# Patient Record
Sex: Female | Born: 1955 | Race: Black or African American | Hispanic: No | Marital: Single | Smoking: Former smoker
Health system: Southern US, Community
[De-identification: ages and names within clinical notes are randomized; demographics above are authoritative.]

## PROBLEM LIST (undated history)

## (undated) DIAGNOSIS — Z9119 Patient's noncompliance with other medical treatment and regimen: Secondary | ICD-10-CM

## (undated) DIAGNOSIS — I472 Ventricular tachycardia, unspecified: Secondary | ICD-10-CM

## (undated) DIAGNOSIS — F419 Anxiety disorder, unspecified: Secondary | ICD-10-CM

## (undated) DIAGNOSIS — D6859 Other primary thrombophilia: Secondary | ICD-10-CM

## (undated) DIAGNOSIS — Z91199 Patient's noncompliance with other medical treatment and regimen due to unspecified reason: Secondary | ICD-10-CM

## (undated) DIAGNOSIS — I10 Essential (primary) hypertension: Secondary | ICD-10-CM

## (undated) DIAGNOSIS — R569 Unspecified convulsions: Secondary | ICD-10-CM

## (undated) DIAGNOSIS — I428 Other cardiomyopathies: Secondary | ICD-10-CM

## (undated) DIAGNOSIS — I639 Cerebral infarction, unspecified: Secondary | ICD-10-CM

## (undated) DIAGNOSIS — Z86718 Personal history of other venous thrombosis and embolism: Secondary | ICD-10-CM

## (undated) DIAGNOSIS — F410 Panic disorder [episodic paroxysmal anxiety] without agoraphobia: Secondary | ICD-10-CM

## (undated) DIAGNOSIS — I5022 Chronic systolic (congestive) heart failure: Secondary | ICD-10-CM

## (undated) DIAGNOSIS — N289 Disorder of kidney and ureter, unspecified: Secondary | ICD-10-CM

## (undated) DIAGNOSIS — E039 Hypothyroidism, unspecified: Secondary | ICD-10-CM

## (undated) DIAGNOSIS — J45909 Unspecified asthma, uncomplicated: Secondary | ICD-10-CM

## (undated) DIAGNOSIS — I4891 Unspecified atrial fibrillation: Secondary | ICD-10-CM

## (undated) HISTORY — DX: Other cardiomyopathies: I42.8

## (undated) HISTORY — PX: ABDOMINAL HYSTERECTOMY: SHX81

## (undated) HISTORY — PX: APPENDECTOMY: SHX54

## (undated) HISTORY — PX: CHOLECYSTECTOMY: SHX55

## (undated) HISTORY — PX: PACEMAKER INSERTION: SHX728

---

## 2009-03-30 ENCOUNTER — Emergency Department (HOSPITAL_BASED_OUTPATIENT_CLINIC_OR_DEPARTMENT_OTHER): Admission: EM | Admit: 2009-03-30 | Discharge: 2009-03-30 | Payer: Self-pay | Admitting: Emergency Medicine

## 2009-04-11 ENCOUNTER — Ambulatory Visit: Payer: Self-pay | Admitting: Diagnostic Radiology

## 2009-04-11 ENCOUNTER — Emergency Department (HOSPITAL_BASED_OUTPATIENT_CLINIC_OR_DEPARTMENT_OTHER): Admission: EM | Admit: 2009-04-11 | Discharge: 2009-04-11 | Payer: Self-pay | Admitting: Emergency Medicine

## 2009-08-29 ENCOUNTER — Ambulatory Visit: Payer: Self-pay | Admitting: Diagnostic Radiology

## 2009-08-29 ENCOUNTER — Emergency Department (HOSPITAL_BASED_OUTPATIENT_CLINIC_OR_DEPARTMENT_OTHER): Admission: EM | Admit: 2009-08-29 | Discharge: 2009-08-29 | Payer: Self-pay | Admitting: Emergency Medicine

## 2010-06-18 ENCOUNTER — Ambulatory Visit: Payer: Self-pay | Admitting: Diagnostic Radiology

## 2010-06-18 ENCOUNTER — Emergency Department (HOSPITAL_BASED_OUTPATIENT_CLINIC_OR_DEPARTMENT_OTHER): Admission: EM | Admit: 2010-06-18 | Discharge: 2010-06-18 | Payer: Self-pay | Admitting: Emergency Medicine

## 2010-10-15 ENCOUNTER — Emergency Department (HOSPITAL_BASED_OUTPATIENT_CLINIC_OR_DEPARTMENT_OTHER)
Admission: EM | Admit: 2010-10-15 | Discharge: 2010-10-15 | Payer: Self-pay | Source: Home / Self Care | Admitting: Emergency Medicine

## 2010-12-01 LAB — GLUCOSE, CAPILLARY: Glucose-Capillary: 117 mg/dL — ABNORMAL HIGH (ref 70–99)

## 2010-12-20 LAB — URINALYSIS, ROUTINE W REFLEX MICROSCOPIC
Bilirubin Urine: NEGATIVE
Hgb urine dipstick: NEGATIVE
pH: 5.5 (ref 5.0–8.0)

## 2010-12-20 LAB — COMPREHENSIVE METABOLIC PANEL
AST: 43 U/L — ABNORMAL HIGH (ref 0–37)
BUN: 14 mg/dL (ref 6–23)
CO2: 26 mEq/L (ref 19–32)
Chloride: 107 mEq/L (ref 96–112)
Creatinine, Ser: 0.7 mg/dL (ref 0.4–1.2)
GFR calc Af Amer: >60 mL/min (ref >60)
GFR calc non Af Amer: >60 mL/min (ref >60)
Glucose, Bld: 132 mg/dL — ABNORMAL HIGH (ref 70–99)
Total Bilirubin: 0.7 mg/dL (ref 0.3–1.2)

## 2010-12-20 LAB — CBC
HCT: 40.9 % (ref 36.0–46.0)
Hemoglobin: 13.2 g/dL (ref 12.0–15.0)
MCHC: 32.3 g/dL (ref 30.0–36.0)
MCV: 98.2 fL (ref 78.0–100.0)
RBC: 4.17 MIL/uL (ref 3.87–5.11)
WBC: 5.5 10*3/uL (ref 4.0–10.5)

## 2010-12-20 LAB — DIFFERENTIAL
Basophils Absolute: 0.1 10*3/uL (ref 0.0–0.1)
Eosinophils Relative: 2 % (ref 0–5)
Lymphocytes Relative: 31 % (ref 12–46)
Neutrophils Relative %: 52 % (ref 43–77)

## 2010-12-20 LAB — POCT CARDIAC MARKERS: CKMB, poc: <1 ng/mL — ABNORMAL LOW (ref 1.0–8.0)

## 2010-12-20 LAB — LIPASE, BLOOD: Lipase: 254 U/L (ref 23–300)

## 2012-04-22 ENCOUNTER — Encounter (HOSPITAL_BASED_OUTPATIENT_CLINIC_OR_DEPARTMENT_OTHER): Payer: Self-pay | Admitting: *Deleted

## 2012-04-22 ENCOUNTER — Emergency Department (HOSPITAL_BASED_OUTPATIENT_CLINIC_OR_DEPARTMENT_OTHER): Payer: Medicare Other

## 2012-04-22 ENCOUNTER — Inpatient Hospital Stay (HOSPITAL_BASED_OUTPATIENT_CLINIC_OR_DEPARTMENT_OTHER)
Admission: EM | Admit: 2012-04-22 | Discharge: 2012-04-25 | DRG: 948 | Disposition: A | Discharge status: Home / Self Care | Payer: Medicare Other | Attending: Internal Medicine | Admitting: Internal Medicine

## 2012-04-22 DIAGNOSIS — J811 Chronic pulmonary edema: Secondary | ICD-10-CM

## 2012-04-22 DIAGNOSIS — R109 Unspecified abdominal pain: Secondary | ICD-10-CM

## 2012-04-22 DIAGNOSIS — I509 Heart failure, unspecified: Secondary | ICD-10-CM | POA: Diagnosis present

## 2012-04-22 DIAGNOSIS — R4182 Altered mental status, unspecified: Principal | ICD-10-CM

## 2012-04-22 DIAGNOSIS — I3139 Other pericardial effusion (noninflammatory): Secondary | ICD-10-CM

## 2012-04-22 DIAGNOSIS — I69320 Aphasia following cerebral infarction: Secondary | ICD-10-CM

## 2012-04-22 DIAGNOSIS — Z95810 Presence of automatic (implantable) cardiac defibrillator: Secondary | ICD-10-CM

## 2012-04-22 DIAGNOSIS — M79609 Pain in unspecified limb: Secondary | ICD-10-CM | POA: Diagnosis present

## 2012-04-22 DIAGNOSIS — I5022 Chronic systolic (congestive) heart failure: Secondary | ICD-10-CM

## 2012-04-22 DIAGNOSIS — F1110 Opioid abuse, uncomplicated: Secondary | ICD-10-CM

## 2012-04-22 DIAGNOSIS — I4891 Unspecified atrial fibrillation: Secondary | ICD-10-CM

## 2012-04-22 DIAGNOSIS — E119 Type 2 diabetes mellitus without complications: Secondary | ICD-10-CM | POA: Diagnosis present

## 2012-04-22 DIAGNOSIS — Z87891 Personal history of nicotine dependence: Secondary | ICD-10-CM

## 2012-04-22 DIAGNOSIS — I69920 Aphasia following unspecified cerebrovascular disease: Secondary | ICD-10-CM

## 2012-04-22 DIAGNOSIS — I313 Pericardial effusion (noninflammatory): Secondary | ICD-10-CM

## 2012-04-22 HISTORY — DX: Unspecified convulsions: R56.9

## 2012-04-22 HISTORY — DX: Essential (primary) hypertension: I10

## 2012-04-22 HISTORY — DX: Unspecified asthma, uncomplicated: J45.909

## 2012-04-22 HISTORY — DX: Cerebral infarction, unspecified: I63.9

## 2012-04-22 HISTORY — DX: Disorder of kidney and ureter, unspecified: N28.9

## 2012-04-22 LAB — CBC
HCT: 31.4 % — ABNORMAL LOW (ref 36.0–46.0)
Hemoglobin: 10.4 g/dL — ABNORMAL LOW (ref 12.0–15.0)
MCH: 28.6 pg (ref 26.0–34.0)
MCHC: 33.1 g/dL (ref 30.0–36.0)

## 2012-04-22 LAB — COMPREHENSIVE METABOLIC PANEL
BUN: 14 mg/dL (ref 6–23)
Calcium: 9.6 mg/dL (ref 8.4–10.5)
GFR calc Af Amer: 83 mL/min — ABNORMAL LOW (ref >90)
Glucose, Bld: 115 mg/dL — ABNORMAL HIGH (ref 70–99)
Sodium: 137 mEq/L (ref 135–145)
Total Protein: 7.3 g/dL (ref 6.0–8.3)

## 2012-04-22 LAB — TROPONIN I: Troponin I: <0.3 ng/mL (ref <0.30)

## 2012-04-22 LAB — GLUCOSE, CAPILLARY: Glucose-Capillary: 123 mg/dL — ABNORMAL HIGH (ref 70–99)

## 2012-04-22 NOTE — ED Notes (Signed)
Shortness of breath since lunch today. Pt reports right flank/rib pain and right ankle pain x1 month. Pt was evaluated by [facility] and was told she had a kidney cyst. Pt has a f/u appt with her PMD tomorrow. Brother reports pt has had decreased appetite and fluid intake. States he's worried about her "bad color" today. Pt unable to verbalize well and has right sided weakness d/t prior stroke.

## 2012-04-22 NOTE — ED Provider Notes (Addendum)
History  This chart was scribed for Harold Mattes Smitty Cords, MD by Erskine Emery. This patient was seen in room MH03/MH03 and the patient's care was started at 22:10.   CSN: [id number]  Arrival date & time 04/22/12  2210   None     Chief Complaint  Patient presents with  . Shortness of Breath    (Consider location/radiation/quality/duration/timing/severity/associated sxs/prior treatment) Patient is a 56 y.o. female presenting with shortness of breath. The history is provided by the patient and a relative.  Shortness of Breath  The current episode started yesterday. The onset was sudden. The problem occurs continuously. The problem has been unchanged. The problem is severe. Nothing relieves the symptoms. Nothing aggravates the symptoms. Associated symptoms include sore throat (from screaming) and shortness of breath. Pertinent negatives include no chest pain, no fever and no cough. There was no intake of a foreign body. She has had prior hospitalizations. Her past medical history is significant for asthma. She has been behaving normally. Recently, medical care has been given at another facility. Services received include tests performed.   Elizabeth Love is a 56 y.o. female who presents to the Emergency Department complaining of SOB since this afternoon and right ankle and back pain for the past month. Pt's brother reports she was screaming from the pain this afternoon and now has a sore throat. Pt was seen on Thrusday night at [facility] for the same complaint. She had an abdominal x-ray, was found to be dehydrated and constipated, and was prescribed stool softeners. Pt has a h/o seizures (for which she takes Keppra), HTN, pacemaker placement, stroke (w/ associated right sided weakness), and DM. Pt has been on no long car trips lately.     Past Medical History  Diagnosis Date  . Seizures   . Hypertension   . Stroke   . Asthma   . Diabetes mellitus   . Renal disorder   . Thyroid disease     . Pacemaker     Past Surgical History  Procedure Date  . Cholecystectomy   . Appendectomy   . Pacemaker insertion     No family history on file.  History  Substance Use Topics  . Smoking status: Former Games developer  . Smokeless tobacco: Not on file  . Alcohol Use: No    OB History    Grav Para Term Preterm Abortions TAB SAB Ect Mult Living                  Review of Systems  Constitutional: Negative for fever and fatigue.  HENT: Positive for sore throat (from screaming). Negative for congestion, sinus pressure and ear discharge.   Eyes: Negative for discharge.  Respiratory: Positive for shortness of breath. Negative for cough.   Cardiovascular: Negative for chest pain.  Gastrointestinal: Negative for nausea, vomiting, abdominal pain and diarrhea.  Genitourinary: Negative for dysuria, frequency and hematuria.  Musculoskeletal: Positive for joint swelling.       Ankle swelling and pain  Skin: Negative for rash.  Neurological: Negative for seizures and headaches. Weakness: right sided weakness d/t prior stroke.  Hematological: Negative.   Psychiatric/Behavioral: Negative for hallucinations.  All other systems reviewed and are negative.    Allergies  Penicillins and Sulfa antibiotics  Home Medications   Current Outpatient Rx  Name Route Sig Dispense Refill  . ACETAMINOPHEN ER 650 MG PO TBCR Oral Take 650 mg by mouth every 8 (eight) hours as needed. For pain    . ALBUTEROL SULFATE (2.5  MG/3ML) 0.083% IN NEBU Nebulization Take 2.5 mg by nebulization every 6 (six) hours as needed. For cough or wheeze    . XANAX PO Oral Take 1 tablet by mouth at bedtime as needed. For sleep    . GLIPIZIDE ER 10 MG PO TB24 Oral Take 10 mg by mouth 2 (two) times daily.    Marland Kitchen LEVETIRACETAM 500 MG PO TABS Oral Take 500 mg by mouth 4 (four) times daily.     Marland Kitchen LEVOTHYROXINE SODIUM 75 MCG PO TABS Oral Take 75 mcg by mouth daily.    Marland Kitchen LISINOPRIL 5 MG PO TABS Oral Take 5 mg by mouth daily.    Marland Kitchen  METOPROLOL TARTRATE 25 MG PO TABS Oral Take 25 mg by mouth 2 (two) times daily.    Marland Kitchen OMEPRAZOLE 20 MG PO CPDR Oral Take 20 mg by mouth 2 (two) times daily.    Marland Kitchen PERCOCET PO Oral Take 1 tablet by mouth every 4 (four) hours as needed. For pain    . TERAZOSIN HCL 2 MG PO CAPS Oral Take 4 mg by mouth daily.       Triage Vitals: BP 114/73  Pulse 108  Temp 97.8 F (36.6 C) (Oral)  Resp 20  Ht 5\' 2"  (1.575 m)  Wt 148 lb (67.132 kg)  BMI 27.07 kg/m2  SpO2 97%  Physical Exam  Nursing note and vitals reviewed. Constitutional: She is oriented to person, place, and time. She appears well-developed and well-nourished. No distress.  HENT:  Head: Normocephalic and atraumatic.  Mouth/Throat: Oropharynx is clear and moist. No oropharyngeal exudate.  Eyes: EOM are normal. Pupils are equal, round, and reactive to light.  Neck: Neck supple. No tracheal deviation present.  Cardiovascular: Normal heart sounds and intact distal pulses.  A regularly irregular rhythm present. Tachycardia present.        Intact DTRs, normal throughout.   Pulmonary/Chest: No respiratory distress. She has decreased breath sounds. She has no wheezes. She has no rhonchi. She exhibits tenderness.       Left upper chest wall tender.   Abdominal: Soft. Bowel sounds are normal. She exhibits no distension. There is no tenderness. There is no rebound and no guarding.  Musculoskeletal: Normal range of motion. She exhibits edema.       Minimal swelling of right leg.   Lymphadenopathy:    She has no cervical adenopathy.  Neurological: She is alert and oriented to person, place, and time.  Skin: Skin is warm and dry. No rash noted.  Psychiatric: She has a normal mood and affect.    ED Course  Procedures (including critical care time) DIAGNOSTIC STUDIES: Oxygen Saturation is 97% on room air, adequate by my interpretation.    COORDINATION OF CARE: 23:19--I evaluated the patient.   Labs Reviewed  CBC - Abnormal; Notable for the  following:    RBC 3.64 (*)     Hemoglobin 10.4 (*)     HCT 31.4 (*)     All other components within normal limits  GLUCOSE, CAPILLARY - Abnormal; Notable for the following:    Glucose-Capillary 123 (*)     All other components within normal limits  COMPREHENSIVE METABOLIC PANEL  TROPONIN I   No results found.   No diagnosis found.    MDM  Hospitalist at [facility] refused admission, Dr. Rodney Cruise cardiologist does not have admitting privileges  Dr. Debby Bud did not return call immediately and then called back and not on call Dr. Dava Najjar declined  Date: 04/23/2012  Rate:113  Rhythm: atrial fibrillation  QRS Axis: right  Intervals: QT prolonged  ST/T Wave abnormalities: nonspecific ST changes  Conduction Disutrbances:none  Narrative Interpretation:   Old EKG Reviewed: none available     I personally performed the services described in this documentation, which was scribed in my presence. The recorded information has been reviewed and considered.    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  Patient was alert at the time of transfer  Rosalio Catterton K Sheriff Rodenberg-Rasch, MD 04/23/12 [phone]

## 2012-04-23 ENCOUNTER — Inpatient Hospital Stay (HOSPITAL_COMMUNITY): Payer: Medicare Other

## 2012-04-23 ENCOUNTER — Encounter (HOSPITAL_COMMUNITY): Payer: Self-pay | Admitting: Physician Assistant

## 2012-04-23 DIAGNOSIS — F1310 Sedative, hypnotic or anxiolytic abuse, uncomplicated: Secondary | ICD-10-CM

## 2012-04-23 DIAGNOSIS — I69920 Aphasia following unspecified cerebrovascular disease: Secondary | ICD-10-CM

## 2012-04-23 DIAGNOSIS — R4182 Altered mental status, unspecified: Principal | ICD-10-CM

## 2012-04-23 DIAGNOSIS — I5022 Chronic systolic (congestive) heart failure: Secondary | ICD-10-CM

## 2012-04-23 DIAGNOSIS — I4891 Unspecified atrial fibrillation: Secondary | ICD-10-CM

## 2012-04-23 DIAGNOSIS — I69320 Aphasia following cerebral infarction: Secondary | ICD-10-CM

## 2012-04-23 DIAGNOSIS — F1110 Opioid abuse, uncomplicated: Secondary | ICD-10-CM | POA: Diagnosis present

## 2012-04-23 DIAGNOSIS — R109 Unspecified abdominal pain: Secondary | ICD-10-CM

## 2012-04-23 DIAGNOSIS — I319 Disease of pericardium, unspecified: Secondary | ICD-10-CM

## 2012-04-23 LAB — POCT I-STAT 3, ART BLOOD GAS (G3+)
Patient temperature: 97.5
pCO2 arterial: 32.6 mmHg — ABNORMAL LOW (ref 35.0–45.0)
pH, Arterial: 7.415 (ref 7.350–7.450)

## 2012-04-23 LAB — AMMONIA: Ammonia: 29 umol/L (ref 11–60)

## 2012-04-23 LAB — PROTIME-INR
INR: 1.61 — ABNORMAL HIGH (ref 0.00–1.49)
Prothrombin Time: 19.4 seconds — ABNORMAL HIGH (ref 11.6–15.2)

## 2012-04-23 LAB — CARDIAC PANEL(CRET KIN+CKTOT+MB+TROPI)
CK, MB: 2.6 ng/mL (ref 0.3–4.0)
Relative Index: 2.6 — ABNORMAL HIGH (ref 0.0–2.5)
Total CK: 100 U/L (ref 7–177)

## 2012-04-23 LAB — GLUCOSE, CAPILLARY: Glucose-Capillary: 133 mg/dL — ABNORMAL HIGH (ref 70–99)

## 2012-04-23 LAB — PRO B NATRIURETIC PEPTIDE: Pro B Natriuretic peptide (BNP): 3629 pg/mL — ABNORMAL HIGH (ref 0–125)

## 2012-04-23 MED ORDER — FUROSEMIDE 40 MG PO TABS
40.0000 mg | ORAL_TABLET | Freq: Every day | ORAL | Status: DC
  Administered 2012-04-23 – 2012-04-25 (×3): 40 mg via ORAL
  Filled 2012-04-23 (×3): qty 1

## 2012-04-23 MED ORDER — SODIUM CHLORIDE 0.9 % IJ SOLN
3.0000 mL | Freq: Two times a day (BID) | INTRAMUSCULAR | Status: DC
  Administered 2012-04-24 – 2012-04-25 (×3): 3 mL via INTRAVENOUS

## 2012-04-23 MED ORDER — INSULIN ASPART 100 UNIT/ML ~~LOC~~ SOLN
0.0000 [IU] | Freq: Three times a day (TID) | SUBCUTANEOUS | Status: DC
  Administered 2012-04-23: 1 [IU] via SUBCUTANEOUS
  Administered 2012-04-24: 2 [IU] via SUBCUTANEOUS
  Administered 2012-04-24 – 2012-04-25 (×3): 1 [IU] via SUBCUTANEOUS

## 2012-04-23 MED ORDER — PANTOPRAZOLE SODIUM 40 MG PO TBEC
40.0000 mg | DELAYED_RELEASE_TABLET | Freq: Two times a day (BID) | ORAL | Status: DC
  Administered 2012-04-23 – 2012-04-25 (×3): 40 mg via ORAL
  Filled 2012-04-23 (×4): qty 1

## 2012-04-23 MED ORDER — GLIPIZIDE ER 10 MG PO TB24: 10.0000 mg | ORAL_TABLET | Freq: Two times a day (BID) | ORAL | Status: DC

## 2012-04-23 MED ORDER — LEVOTHYROXINE SODIUM 75 MCG PO TABS
75.0000 ug | ORAL_TABLET | Freq: Every day | ORAL | Status: DC
  Administered 2012-04-23 – 2012-04-25 (×3): 75 ug via ORAL
  Filled 2012-04-23 (×5): qty 1

## 2012-04-23 MED ORDER — KETOROLAC TROMETHAMINE 30 MG/ML IJ SOLN
30.0000 mg | Freq: Once | INTRAMUSCULAR | Status: AC
  Administered 2012-04-23: 30 mg via INTRAVENOUS

## 2012-04-23 MED ORDER — OXYCODONE-ACETAMINOPHEN 10-325 MG PO TABS: 1.0000 | ORAL_TABLET | Freq: Three times a day (TID) | ORAL | Status: DC | PRN

## 2012-04-23 MED ORDER — SODIUM CHLORIDE 0.9 % IJ SOLN: 3.0000 mL | INTRAMUSCULAR | Status: DC | PRN

## 2012-04-23 MED ORDER — METOPROLOL TARTRATE 25 MG PO TABS
25.0000 mg | ORAL_TABLET | Freq: Two times a day (BID) | ORAL | Status: DC
  Administered 2012-04-23 – 2012-04-25 (×4): 25 mg via ORAL
  Filled 2012-04-23 (×5): qty 1

## 2012-04-23 MED ORDER — LORAZEPAM 0.5 MG PO TABS
0.5000 mg | ORAL_TABLET | Freq: Once | ORAL | Status: DC
  Filled 2012-04-23: qty 1

## 2012-04-23 MED ORDER — KETOROLAC TROMETHAMINE 30 MG/ML IJ SOLN: 30.0000 mg | Freq: Once | INTRAMUSCULAR | Status: DC

## 2012-04-23 MED ORDER — LEVETIRACETAM 500 MG PO TABS
500.0000 mg | ORAL_TABLET | Freq: Four times a day (QID) | ORAL | Status: DC
  Administered 2012-04-23 – 2012-04-25 (×9): 500 mg via ORAL
  Filled 2012-04-23 (×13): qty 1

## 2012-04-23 MED ORDER — OXYCODONE HCL 5 MG PO TABS
15.0000 mg | ORAL_TABLET | ORAL | Status: DC | PRN
  Administered 2012-04-23 – 2012-04-25 (×5): 15 mg via ORAL
  Filled 2012-04-23 (×6): qty 3

## 2012-04-23 MED ORDER — SODIUM CHLORIDE 0.9 % IJ SOLN
3.0000 mL | Freq: Two times a day (BID) | INTRAMUSCULAR | Status: DC
  Administered 2012-04-23: 3 mL via INTRAVENOUS

## 2012-04-23 MED ORDER — IOHEXOL 350 MG/ML SOLN
80.0000 mL | Freq: Once | INTRAVENOUS | Status: AC | PRN
  Administered 2012-04-23: 80 mL via INTRAVENOUS

## 2012-04-23 MED ORDER — OXYCODONE-ACETAMINOPHEN 5-325 MG PO TABS: 2.0000 | ORAL_TABLET | ORAL | Status: DC | PRN

## 2012-04-23 MED ORDER — ALBUTEROL SULFATE (5 MG/ML) 0.5% IN NEBU: 2.5000 mg | INHALATION_SOLUTION | Freq: Four times a day (QID) | RESPIRATORY_TRACT | Status: DC | PRN

## 2012-04-23 MED ORDER — SODIUM CHLORIDE 0.9 % IV SOLN: 250.0000 mL | INTRAVENOUS | Status: DC | PRN

## 2012-04-23 MED ORDER — LISINOPRIL 5 MG PO TABS
5.0000 mg | ORAL_TABLET | Freq: Every day | ORAL | Status: DC
  Administered 2012-04-23 – 2012-04-25 (×3): 5 mg via ORAL
  Filled 2012-04-23 (×3): qty 1

## 2012-04-23 MED ORDER — KETOROLAC TROMETHAMINE 30 MG/ML IJ SOLN
INTRAMUSCULAR | Status: AC
  Filled 2012-04-23: qty 1

## 2012-04-23 NOTE — Progress Notes (Signed)
Pt c/o severe pain in left side of head. Pt refuses oxycodone at present. Pt sitting on bed eating food son had brought. No tearful when son at bedside. When son left room pt began to cry with severe pain. Told pt if she changed mind about taking pain med please call nurse back. I left room pt became quiet. Emelda Brothers RN

## 2012-04-23 NOTE — ED Notes (Signed)
O2 applied sats low 80's

## 2012-04-23 NOTE — Progress Notes (Addendum)
Basic admit orders written while we await IM eval. CT head pending. Repeating PT/INR as it was elevated at baseline. Will hold glipizide and initiate SSI. Hold off on terazosin given borderline BPs. Per D/W Dr. Gala Romney, initiate Lasix 40mg  daily -- PO meds ordered but have asked nursing to hold off pending swallow screen. Keep NPO pending further decision making.  Dayna Dunn PA-C  Will also request ICD interrogation (brother stated she had St Jude device). Dayna Dunn PA-C

## 2012-04-23 NOTE — Care Management Note (Addendum)
    Page 1 of 2   04/25/2012     2:14:50 PM   CARE MANAGEMENT NOTE 04/25/2012  Patient:  Elizabeth Love, Elizabeth Love   Account Number:  [id number]  Date Initiated:  04/23/2012  Documentation initiated by:  Junius Creamer  Subjective/Objective Assessment:   adm wpul edema, confusion     Action/Plan:   lives w brother and mother. act w aid 10a to 3p daily. pt has medicaid. uses [facility] for [facility].   Anticipated DC Date:  04/24/2012   Anticipated DC Plan:  HOME W HOME HEALTH SERVICES      DC Planning Services  CM consult      [facility] Choice  Resumption Of Svcs/PTA Provider   Choice offered to / List presented to:  C-5 Sibling        HH arranged  HH-1 RN  HH-2 PT      [facility] agency  [facility]   Status of service:  Completed, signed off Medicare Important Message given?   (If response is "NO", the following Medicare IM given date fields will be blank) Date Medicare IM given:   Date Additional Medicare IM given:    Discharge Disposition:  HOME W HOME HEALTH SERVICES  Per UR Regulation:  Reviewed for med. necessity/level of care/duration of stay  If discussed at Long Length of Stay Meetings, dates discussed:    Comments:  PCP- [facility]  04/25/12- 1400- Donn Pierini RN, BSN [phone] Pt for d/c today - home with brother- HH orders for RN/PT- call placed to pt's brother Elizabeth Love [phone]) regarding HH- per TC pt's brother states pt has CAPS- and has used [facility] for [facility] services in the past- explained that [facility] would not interfer with CAPS- and brother agreeable to [facility] services. Referral placed to [facility] via [facility] and call made to [facility] with [facility]. Services to begin within 24-48 hr. post discharge. Per brother pt has all needed DME at home at this time.   8/6 10:48a debbie dowell rn,bsn [phone] uses [facility] for aid, [facility] for [facility]. [facility] ck to see if still act or not and may need orders for resumption or new hhc ref again if not act w pt.

## 2012-04-23 NOTE — Progress Notes (Signed)
[facility] PROGRESS NOTE  LAYAN ZALENSKI ZOX:[id number] DOB: 11-Nov-1955 DOA: 04/22/2012 PCP: No primary provider on file.  Assessment/Plan:  Abdominal pain Discussed with patient's family and patient's primary care physician. This seem to be a chronic problem. Placed her on OxyIR 15 mg every 6 when necessary. She's had CT scans of her abdomen done in the past that is negative. Patient to followup outpatient with her primary care physician. She may need to go to a pain clinic. Restart her on a diabetic diet.   Altered mental status Patient seem to be at baseline per discussion with family. She does have some expressive aphagia and normally can only answer mostly yes or no to questions. Per family she also has problem with long-term memory.   Aphasia as late effect of cerebrovascular accident Chronic   Chronic systolic heart failure/ Atrial fibrillation Per cardiology seem to be stable   Narcotic abuse Patient will need to discuss this with her primary care physician. Will limit narcotic use secondary to patient being more somnolent earlier secondary to her narcotics  Use.  Diabetes Place her on sliding scale insulin,  Right lower extremity pain Patient may benefit from something like Neurontin. Doppler ultrasound of the lower extremities negative. Will check a B12 level. Also will check C-spine CT.  Code Status: Full code Family Communication: Discussed with the patient's brother and also her son Disposition Plan: Home with family tomorrow  Molli Posey, MD  [facility] Pager [phone]  If 8PM-8AM, please contact night-coverage [email or web address] Password [email or web address] 04/23/2012, 2:58 PM   LOS: 1 day   Patient is a 56 year old African American female that was admitted to the cardiology service last night. Per discussion with cardiology patient has history of congestive heart failure with EF of 17%. Patient had come in with altered mental status and complaining of severe  pain on the right side of her body where she had the stroke with severe abdominal pain on that right side of her abdomen and in the right lower extremity. Patient normally goes to [facility] was called by the ER physician and they refused the patient. Patient was admitted to the Cardiology service here. Per cardiology they would like [place] to assume care of since patient as they do not think the pain is caused by her and congestive heart failure. Discussed with patient's son and also patient's brother. Per discussion with the family patient has had pain for a long time mostly just all over her abdomen. Patient has been on chronic narcotic. She's been on MS Contin with Percocet when necessary for breakthrough pain. Per discussion with her son her primary care doctor has weaned her off the MS Contin and now she's only on Percocet and the Percocet is not holding her pain. She's been moaning all the time especially in the evenings. She was weaned off  MS Contin 8 months now. Per son over the past 3-4 weeks she's been having severe abdominal pain and complaining of severe pain on the left side of her body and moaning. 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 She does move around using a cane. Per discussion with so, she was taken to the [facility] emergency room and reviewing the [facility] emergency room records she goes frequently go there for pain medication. She's had CT scan without contrast of the abdomen and pelvis and ultrasound done of her abdomen. Per reviewing the note from the ER it seem as if  she's had multiple scans done at that hospital which have been negative. She was discharged home from [facility]  ER on Friday August 2 with bowel regimen for constipation. Per discussion with bother she was given the bowel regimen and has had large bowel movement since then. Patient continues to have abdominal pain. Discussed with patient's primary  care physician Dr. Kathrynn Speed at [facility]. He is very familiar with patient, he states that patient has chronic pain and normally has abdominal pain pain all over at baseline. He states that patient also does abuse narcotics. He prescribe her narcotics 03/28/2012 and patient was almost out of  the narcotics. He states that if workup is negative patient can be discharged and the family should call to schedule an appointment with him for follow up. Patient had altered mental status when she presented. Per discussion with son the patient had taken her benzodiazepine and her narcotic prior to presentation. They already have an appointment scheduled for August 16  With PCP.  They can call  For a  earlier appointment. Did discuss this with patient's son. So far all the workup has been negative patient had CT scan of the of the chest Doppler ultrasound of the lower extremity, CXR.  ordered C-spine film as patient is unable to get MRI secondary to pacemaker. If this is negative then I suspect patient can follow up outpatient for further workup for and for probably referral to pain management clinic. Did discuss this with patient's son and brother. Also consulted social work to see if patient may need any home assistance.  Transfer patient to telemetry  Consultants:  Cardiology  Procedures:  Reviewed  Antibiotics:  None ?  HPI/Subjective: Patient is crying for pain on the right side of her body.  Objective: Filed Vitals:   04/23/12 0620 04/23/12 0800 04/23/12 1130 04/23/12 1200  BP: 102/77 92/71 109/73   Pulse: 89 96 89   Temp:      TempSrc:   Oral   Resp:  24 27 26   Height: 5\' 2"  (1.575 m)     Weight: 70.2 kg (154 lb 12.2 oz)     SpO2: 100% 98% 100%      Exam:   General: Patient is laying in bed moaning in pain   Cardiovascular: Regular rate rhythm 2/6 systolic murmur  Respiratory: Clear to auscultations bilaterally   Abdomen: Positive bowel sounds, diffusely tender to  touch  Extremities: No edema, right lower extremity is tender to palpation  Neuro: Patient has some degree of expressive aphagia. She is unable to move the right side of her body  Data Reviewed: Basic Metabolic Panel:  Lab 04/22/12 1610  NA 137  K 3.9  CL 107  CO2 21  GLUCOSE 115*  BUN 14  CREATININE 0.90  CALCIUM 9.6  MG --  PHOS --   Liver Function Tests:  Lab 04/22/12 2236  AST 32  ALT 14  ALKPHOS 51  BILITOT 0.7  PROT 7.3  ALBUMIN 3.3*      Lab 04/23/12 0820  AMMONIA 29   CBC:  Lab 04/22/12 2236  WBC 5.3  NEUTROABS --  HGB 10.4*  HCT 31.4*  MCV 86.3  PLT 201   Cardiac Enzymes:  Lab 04/23/12 0820 04/22/12 2236  CKTOTAL 100 --  CKMB 2.6 --  CKMBINDEX -- --  TROPONINI <0.30 <0.30   :  Lab 04/22/12 2230  GLUCAP 123*    Recent Results (from the past 240 hour(s))  MRSA PCR  SCREENING     Status: Normal   Collection Time   04/23/12  6:22 AM      Component Value Range Status Comment   MRSA by PCR NEGATIVE  NEGATIVE Final      Studies: Dg Chest 2 View  04/22/2012  *RADIOLOGY REPORT*  Clinical Data: Shortness of breath, asthma  CHEST - 2 VIEW  Comparison: Of 06/18/2010  Findings: Left subclavian AICD noted.  Massive cardiac enlargement, pericardial effusion not excluded.  Diffuse interstitial edema throughout the lungs with basilar atelectasis compatible with mild CHF.  Similar appearance to the prior study.  No pneumothorax. Trace pleural effusions suspected.  IMPRESSION: Mild CHF pattern  Original Report Authenticated By: Judie Petit. Ruel Favors, M.D.   Ct Head Wo Contrast  04/23/2012  *RADIOLOGY REPORT*  Clinical Data: Altered mental status, history of dysphasia from prior stroke with right-sided weakness, now with frontal headache  CT HEAD WITHOUT CONTRAST  Technique:  Contiguous axial images were obtained from the base of the skull through the vertex without contrast.  Comparison: None.  Findings: Examination is degraded secondary to patient motion. There  is marked encephalomalacia involving the left frontal, parietal and occipital lobes, the sequela of large territorial left MCA infarction.  This finding is associated with ex vacuo dilatation of the lateral horn of the left ventricle.  There is scattered periventricular hypodensities about the right lateral ventricle compatible with microvascular ischemic disease.  Given background parenchymal abnormalities and patient motion artifact, there is no CT evidence of acute large territory infarct.  No definite intraparenchymal or extra-axial mass or hemorrhage.  Mild diffuse cortical atrophy.  No midline shift.  The paranasal sinuses and mastoid air cells are normal.  Regional soft tissues are normal.  No displaced calvarial fracture.  IMPRESSION: Degraded examination with findings of microvascular ischemic disease, atrophy and remote large territory left MCA distribution infarct without definite acute superimposed intracranial process.  Original Report Authenticated By: Waynard Reeds, M.D.   Ct Angio Chest Pe W/cm &/or Wo Cm  04/23/2012  *RADIOLOGY REPORT*  Clinical Data: Shortness of breath, asthma, stroke,  CT ANGIOGRAPHY CHEST  Technique:  Multidetector CT imaging of the chest using the standard protocol during bolus administration of intravenous contrast. Multiplanar reconstructed images including MIPs were obtained and reviewed to evaluate the vascular anatomy.  Contrast: 80mL OMNIPAQUE IOHEXOL 350 MG/ML SOLN  Comparison: 04/22/2012 chest x-ray  Findings: Left subclavian AICD noted.  Soft tissue prominence along the battery pack in the left chest wall may represent asymmetric pectoralis musculature versus intramuscular hematoma.  Recommend correlation with exam and pacer site.  Pulmonary arteries are well visualized.  No filling defect or significant pulmonary embolus within the pulmonary arteries by CTA. Massive cardiac enlargement.  Small pericardial effusion noted. Reflux of contrast into the IVC and hepatic  veins compatible with right heart failure.  No definite abnormal adenopathy.  No hiatal hernia.  Lung windows demonstrate vascular congestion diffusely with patchy ground-glass opacities throughout the lungs and septal prominence compatible with mild edema.  No effusions.  No acute abnormal osseous finding  IMPRESSION: Negative for significant acute pulmonary embolus by CTA.  Massive four-chamber cardiac enlargement with a small pericardial effusion and early interstitial edema pattern throughout the lungs.  Evidence of right heart failure with reflux of contrast into the IVC and hepatic veins  Left pectoralis muscular thickening beneath the pacer site which may represent asymmetric musculature versus intramuscular hematoma. Recommend correlation with exam,  No inflammatory changes or abscess in the chest  wall.  Original Report Authenticated By: Judie Petit. Ruel Favors, M.D.   US Venous Img Lower Unilateral Right  04/23/2012  *RADIOLOGY REPORT*  Clinical Data: Right lower leg pain and swelling.  History of arrhythmias.  Pain.  Edema.  And diabetes.  RIGHT LOWER EXTREMITY VENOUS DUPLEX ULTRASOUND  Technique:  Gray-scale sonography with graded compression, as well as color Doppler and duplex ultrasound were performed to evaluate the deep venous system of the lower extremity from the level of the common femoral vein through the popliteal and proximal calf veins. Spectral Doppler was utilized to evaluate flow at rest and with distal augmentation maneuvers.  Comparison:  None.  Findings:  Normal compressibility of the common femoral, superficial femoral, and popliteal veins is demonstrated, as well as the visualized proximal calf veins.  No filling defects to suggest DVT on grayscale or color Doppler imaging.  Doppler waveforms show normal direction of venous flow, normal respiratory phasicity and response to augmentation.  IMPRESSION: No evidence of right lower extremity deep vein thrombosis.  Original Report Authenticated  By: Marlon Pel, M.D.    Scheduled Meds:   . furosemide  40 mg Oral Daily  . insulin aspart  0-9 Units Subcutaneous TID WC  . ketorolac  30 mg Intravenous Once  . levETIRAcetam  500 mg Oral QID  . levothyroxine  75 mcg Oral QAC breakfast  . lisinopril  5 mg Oral Daily  . metoprolol tartrate  25 mg Oral BID  . pantoprazole  40 mg Oral BID AC  . sodium chloride  3 mL Intravenous Q12H  . sodium chloride  3 mL Intravenous Q12H  . DISCONTD: glipiZIDE  10 mg Oral BID  . DISCONTD: ketorolac  30 mg Intravenous Once  . DISCONTD: LORazepam  0.5 mg Oral Once   Continuous Infusions:    Time Spent: 60 min

## 2012-04-23 NOTE — Progress Notes (Signed)
Pt stable report called to [facility] on 3000. Son at bedside

## 2012-04-23 NOTE — Progress Notes (Signed)
Pt received at 0620. Pt has a hard time forming words due to a previous stroke. Initial assessment was preformed, but will need brother at bedside to complete admission.

## 2012-04-23 NOTE — Clinical Social Work Psychosocial (Signed)
     Clinical Social Work Department BRIEF PSYCHOSOCIAL ASSESSMENT 04/23/2012  Patient:  Elizabeth Love, Elizabeth Love     Account Number:  [id number]     Admit date:  04/22/2012  Clinical Social Worker:  Elizabeth Love  Date/Time:  04/23/2012 03:48 PM  Referred by:  Physician  Date Referred:  04/23/2012 Referred for  Other - See comment   Other Referral:   "Patient may need additional assistance at home or ? placement"   Interview type:  Family Other interview type:   and unit RN    PSYCHOSOCIAL DATA Living Status:  FAMILY Admitted from facility:   Level of care:   Primary support name:  Elizabeth Love Primary support relationship to patient:  SIBLING Degree of support available:   adequate    CURRENT CONCERNS Current Concerns  None Noted   Other Concerns:    SOCIAL WORK ASSESSMENT / PLAN Clinical Social Worker received referral for possible home needs. CSW reviewed chart. Per discussion with unit RN, plan is for patient to be discharged home. Per unit RN, patient has a sitter from 10am-3pm at home. CSW spoke with patient's brother, Elizabeth Love, and he states that the patient already has an agency assisting, "[facility]." Patient's brother could not specify which agency is coming to the home. Brother reports that a [name] comes out to check BP, blood sugars, make sure she has her diapers etc. CSW spoke with CM and confirmed that patient has medicaid and is receiving assistance from [employer]. CSW will defer any additional home health needs to CM.   Assessment/plan status:  No Further Intervention Required Other assessment/ plan:   Information/referral to community resources:   Defer to case management for home health needs    PATIENTS/FAMILYS RESPONSE TO PLAN OF CARE: Brother, Elizabeth Love declines any additional home health services.

## 2012-04-23 NOTE — Progress Notes (Signed)
Pt seems to be in pain and when asked stated her head, right flank and right ancle hurt. Had no admission orders for pain so repositioned pt and pt calmed down and started to rest. The Dr was paged but still awaiting call. Day nurse notified of pts condition and will continue to address pain.

## 2012-04-23 NOTE — ED Notes (Signed)
Report given to Jordan,RN for pt going to room 2916-1

## 2012-04-23 NOTE — H&P (Signed)
History and Physical  Patient ID: Elizabeth Love MRN: [id number], DOB: [birth year] Date of Encounter: 04/23/2012, 7:36 AM Primary Physician: No primary provider on file. 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 The patient was initially obtunded upon receipt of transfer to [facility] and unable to provide a history. She is more alert now but still unable to provide information. We have spoken with Dr. Nicanor Alcon (EDP) as well as the patient's brother for history. Per these discussions, the patient was seen in the [facility] ER last week for flank pain, had abdominal x-ray and was found to be dehydrated and constipated and prescribed Mag Citrate. Since that time she has not been herself. 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 However, for the past 2 days he has noticed a change in her mental status and as of last night was screaming persistently yesterday due to pain. He does not think she took extra Percocet or Xanax than prescribed. He also reports she appeared more SOB. He does not believe she had chest pain. Per EDP, when she came in she was alert and able to talk to triage but would occasionally have hypermanic episodes where she would scream and become very agitated - this alternated with episodes of obtundation. Per Dr. Nicanor Alcon, the patient verbally refused ativan so has not gotten any narcotics or sedatives thus far. She did receive IV toradol. She still moans and complains of R flank pain, L foot pain, and  headache. Dr. Nicanor Alcon reports that she attempted to get the patient admitted to internal medicine at [facility] where all of her care has been as well as IM at [facility], but was refused.  EKG demonstrated coarse atrial fib 113bpm with right axis deviation, no prior to compare to. Per discussion with Dr. Nicanor Alcon, the patient's records from [facility] ER visit last week indicated she has a hx of afib but the patient's brother is unaware of such. pBNP 3629. PT/INR are elevated at baseline 19.4/INR 1.61. LFTs WNL except albumin 3.3. Troponin neg x 1. H/H 10.4/31.4. CT angio demonstrated no PE, did show 4-chamber cardiac enlargement with small pericardial effusion and early interstitial edema, evidence of RHF with reflux of contrast into IVC and hepatic veins, L pec muscle thickening beneath pacer site that may represent asymmetric musculature versus intramuscular hematoma without abscess or inflammatory changes. LE venous doppler was negative.  Past Medical History  Diagnosis Date  . Seizures   . Hypertension   . Stroke     2007 - R sided weakness  . Asthma   . Diabetes mellitus   . 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 Thyroid disease   . LV dysfunction     Per brother, EF 17% s/p AICD 2007 ([employer]). No hx CAD.     Most Recent Cardiac Studies: Unavailable     Surgical History:  Past Surgical History  Procedure Date  . Cholecystectomy   . Appendectomy   . Pacemaker insertion  Home Meds: Prior to Admission medications   Medication Sig Start Date End Date Taking? Authorizing Provider  acetaminophen (TYLENOL) 650 MG CR tablet Take 650 mg by mouth every 8 (eight) hours as needed. For pain   Yes Historical Provider, MD  albuterol (PROVENTIL) (2.5 MG/3ML) 0.083% nebulizer solution Take 2.5 mg by nebulization every 6 (six) hours as needed. For cough or wheeze   Yes Historical Provider, MD  ALPRAZolam (XANAX) 0.25 MG tablet Take 0.25 mg by mouth at bedtime as needed.  For sleep   Yes Historical Provider, MD  glipiZIDE (GLUCOTROL XL) 10 MG 24 hr tablet Take 10 mg by mouth 2 (two) times daily.   Yes Historical Provider, MD  levETIRAcetam (KEPPRA) 500 MG tablet Take 500 mg by mouth 4 (four) times daily.    Yes Historical Provider, MD  levothyroxine (SYNTHROID, LEVOTHROID) 75 MCG tablet Take 75 mcg by mouth daily.   Yes Historical Provider, MD  lisinopril (PRINIVIL,ZESTRIL) 5 MG tablet Take 5 mg by mouth daily.   Yes Historical Provider, MD  metoprolol tartrate (LOPRESSOR) 25 MG tablet Take 25 mg by mouth 2 (two) times daily.   Yes Historical Provider, MD  omeprazole (PRILOSEC) 20 MG capsule Take 20 mg by mouth 2 (two) times daily.   Yes Historical Provider, MD  oxyCODONE-acetaminophen (PERCOCET) 10-325 MG per tablet Take 1 tablet by mouth 3 (three) times daily as needed. For pain   Yes Historical Provider, MD  terazosin (HYTRIN) 2 MG capsule Take 4 mg by mouth daily.    Yes Historical Provider, MD    Allergies:  Allergies  Allergen Reactions  . Penicillins Rash  . Sulfa Antibiotics Rash    History   Social History  . Marital Status: Single    Spouse Name: [name]    Number of Children: [name]  . Years of Education: [name]   Occupational History  . Not on file.   Social History Main Topics  . Smoking status: Former Games developer  . Smokeless tobacco: Not on file  . Alcohol Use: No  . Drug Use: No  . Sexually Active:    Other Topics Concern  . Not on file   Social History Narrative  . No narrative on file     Family History  Problem Relation Age of Onset  . Coronary artery disease Neg Hx     Review of Systems: General: negative for chills, fever, night sweats or weight changes.  Cardiovascular: negative for chest pain, edema, orthopnea, palpitations, paroxysmal nocturnal dyspnea, shortness of breath or dyspnea on exertion Dermatological: negative for rash Respiratory: negative for cough or wheezing Urologic: negative for hematuria Abdominal:  negative for nausea, vomiting, diarrhea, bright red blood per rectum, melena, or hematemesis Neurologic: negative for visual changes, syncope, or dizziness All other systems reviewed and are otherwise negative except as noted above.  Labs:   Lab Results  Component Value Date   WBC 5.3 04/22/2012   HGB 10.4* 04/22/2012   HCT 31.4* 04/22/2012   MCV 86.3 04/22/2012   PLT 201 04/22/2012    Lab 04/22/12 2236  NA 137  K 3.9  CL 107  CO2 21  BUN 14  CREATININE 0.90  CALCIUM 9.6  PROT 7.3  BILITOT 0.7  ALKPHOS 51  ALT 14  AST 32  GLUCOSE 115*    Basename 04/22/12 2236  CKTOTAL --  CKMB --  TROPONINI <0.30    Radiology/Studies:  Dg Chest 2 View 04/22/2012  *RADIOLOGY REPORT*  Clinical Data: Shortness of breath, asthma  CHEST - 2 VIEW  Comparison: Of 06/18/2010  Findings: Left subclavian AICD noted.  Massive cardiac enlargement, pericardial effusion not excluded.  Diffuse interstitial edema throughout the lungs with basilar atelectasis compatible with mild CHF.  Similar appearance to the prior study.  No pneumothorax. Trace pleural effusions suspected.  IMPRESSION: Mild CHF pattern  Original Report Authenticated By: Judie Petit. Ruel Favors, M.D.   Ct Angio Chest Pe W/cm &/or Wo Cm 04/23/2012  *RADIOLOGY REPORT*  Clinical Data: Shortness of breath, asthma, stroke,  CT ANGIOGRAPHY CHEST  Technique:  Multidetector CT imaging of the chest using the standard protocol during bolus administration of intravenous contrast. Multiplanar reconstructed images including MIPs were obtained and reviewed to evaluate the vascular anatomy.  Contrast: 80mL OMNIPAQUE IOHEXOL 350 MG/ML SOLN  Comparison: 04/22/2012 chest x-ray  Findings: Left subclavian AICD noted.  Soft tissue prominence along the battery pack in the left chest wall may represent asymmetric pectoralis musculature versus intramuscular hematoma.  Recommend correlation with exam and pacer site.  Pulmonary arteries are well visualized.  No filling defect or  significant pulmonary embolus within the pulmonary arteries by CTA. Massive cardiac enlargement.  Small pericardial effusion noted. Reflux of contrast into the IVC and hepatic veins compatible with right heart failure.  No definite abnormal adenopathy.  No hiatal hernia.  Lung windows demonstrate vascular congestion diffusely with patchy ground-glass opacities throughout the lungs and septal prominence compatible with mild edema.  No effusions.  No acute abnormal osseous finding  IMPRESSION: Negative for significant acute pulmonary embolus by CTA.  Massive four-chamber cardiac enlargement with a small pericardial effusion and early interstitial edema pattern throughout the lungs.  Evidence of right heart failure with reflux of contrast into the IVC and hepatic veins  Left pectoralis muscular thickening beneath the pacer site which may represent asymmetric musculature versus intramuscular hematoma. Recommend correlation with exam,  No inflammatory changes or abscess in the chest wall.  Original Report Authenticated By: Judie Petit. Ruel Favors, M.D.   US Venous Img Lower Unilateral Right 04/23/2012  *RADIOLOGY REPORT*  Clinical Data: Right lower leg pain and swelling.  History of arrhythmias.  Pain.  Edema.  And diabetes.  RIGHT LOWER EXTREMITY VENOUS DUPLEX ULTRASOUND  Technique:  Gray-scale sonography with graded compression, as well as color Doppler and duplex ultrasound were performed to evaluate the deep venous system of the lower extremity from the level of the common femoral vein through the popliteal and proximal calf veins. Spectral Doppler was utilized to evaluate flow at rest and with distal augmentation maneuvers.  Comparison:  None.  Findings:  Normal compressibility of the common femoral, superficial femoral, and popliteal veins is demonstrated, as well as the visualized proximal calf veins.  No filling defects to suggest DVT on grayscale or color Doppler imaging.  Doppler waveforms show normal direction of  venous flow, normal respiratory phasicity and response to augmentation.  IMPRESSION: No evidence of right lower extremity deep vein thrombosis.  Original Report Authenticated By: Marlon Pel, M.D.    EKG: atrial fib/?flutter 113bpm, right access deviation, prolonged QTc 537 with couplet noted  Physical Exam: Blood pressure 102/77, pulse 89, temperature 97.5 F (36.4 C), temperature source Oral, resp. rate 21, height 5\' 2"  (1.575 m), weight 154 lb 12.2 oz (70.2 kg), SpO2 100.00%. General: Well developed AAF in no acute distress, obtunded. Head: Normocephalic, atraumatic, sclera non-icteric, no xanthomas, nares are without discharge.  Neck: Negative for carotid bruits. JVD not elevated. Lungs: Coarse but clear bilaterally to auscultation without wheezes, rales, or  rhonchi. Breathing is unlabored. Heart: Irregularly irregular with S1 S2. No murmurs, rubs, or gallops appreciated. Abdomen: Soft, non-tender, non-distended with normoactive bowel sounds. No hepatomegaly. No rebound/guarding. No obvious abdominal masses. Msk:  Decreased strength R side. Right flank is tender to palpation. Extremities: No clubbing or cyanosis. No edema.  Distal pedal pulses are 2+ and equal bilaterally. Neuro: Alert and oriented to self but she mumbles her name. She is otherwise moaning and groaning. Does not verbalize responses to other questions. Follows minimal commands. Flat affect. Decreased strength R side.    ASSESSMENT AND PLAN:   1. Altered mental status 2. Diffuse pain - back/flank/leg 3. H/o LV dysfunction with cardiac enlargement on CT angio 4. CVA 2007 5. Atrial fibrillation of uncertain chronicity  At this time her clinical picture is unclear and we do not feel this is completely attributable to CHF. Preliminarily we have sent off for stat ABG, serum drug screen, ammonia level, and have placed order for CT of head without contrast. We have asked for internal medicine involvement. See below for  comprehensive thoughts.  Signed, Ronie Spies PA-C 04/23/2012, 7:36 AM   Patient seen and examined with Ronie Spies, PA. All available records reviewed. We discussed all aspects of the encounter. I agree with the assessment and plan as stated above.   Patient very poor historian due to previous CVA. Apparently gets almost all her care at [facility]. Recently seen there for flank and ab pain. According to ER notes presented with dyspnea. While in ER hypermanic with screaming episodes which alternated with obtundation. CT chest with massive four chamber cardiac enlargement but no overt HF on exam. Also has AF, which appears chronic.   ER attempted to get patient back to [facility] but they refused. Also called IM here who refused. Admitted to cardiology for unclear reasons.   Currently tearful complaining of R flank and left foot pain. No obvious HF on exam. Belly exam non-acute. Labs ok. BNP just mildly elevated. Agree with head CT, ABG and ammonia (all of which have been drawn).   While HF can cause some flank pain due to liver capsular distension, I am not sure that this is the primary issue. In fact, I am not sure that she is so far from her baseline.   We are in the process of obtaining records from [facility] for some clarification. We have asked [facility] to see and consider taking on their service and we can follow for cardiology issues. For now will start a basic HF regimen. Add low dose lasix. Check echo.  Appreciate [facility]'s assistance.  Truman Hayward 8:31 AM

## 2012-04-23 NOTE — Progress Notes (Signed)
  Echocardiogram 2D Echocardiogram has been performed.  Elizabeth Love [name] 04/23/2012, 5:34 PM

## 2012-04-24 DIAGNOSIS — J811 Chronic pulmonary edema: Secondary | ICD-10-CM

## 2012-04-24 LAB — URINE CULTURE
Colony Count: NO GROWTH
Culture: NO GROWTH

## 2012-04-24 LAB — URINALYSIS, ROUTINE W REFLEX MICROSCOPIC
Glucose, UA: NEGATIVE mg/dL
Specific Gravity, Urine: 1.022 (ref 1.005–1.030)
pH: 5 (ref 5.0–8.0)

## 2012-04-24 LAB — COMPREHENSIVE METABOLIC PANEL
ALT: 16 U/L (ref 0–35)
AST: 25 U/L (ref 0–37)
Albumin: 3.5 g/dL (ref 3.5–5.2)
Alkaline Phosphatase: 53 U/L (ref 39–117)
BUN: 17 mg/dL (ref 6–23)
Chloride: 106 mEq/L (ref 96–112)
Potassium: 3.8 mEq/L (ref 3.5–5.1)
Total Bilirubin: 0.8 mg/dL (ref 0.3–1.2)

## 2012-04-24 LAB — CBC
MCH: 28.3 pg (ref 26.0–34.0)
MCV: 86.6 fL (ref 78.0–100.0)
Platelets: 232 10*3/uL (ref 150–400)
RDW: 14.4 % (ref 11.5–15.5)

## 2012-04-24 LAB — URINE MICROSCOPIC-ADD ON

## 2012-04-24 LAB — GLUCOSE, CAPILLARY
Glucose-Capillary: 172 mg/dL — ABNORMAL HIGH (ref 70–99)
Glucose-Capillary: 115 mg/dL — ABNORMAL HIGH (ref 70–99)

## 2012-04-24 MED ORDER — PHENAZOPYRIDINE HCL 100 MG PO TABS
100.0000 mg | ORAL_TABLET | Freq: Three times a day (TID) | ORAL | Status: DC
  Administered 2012-04-24 – 2012-04-25 (×3): 100 mg via ORAL
  Filled 2012-04-24 (×6): qty 1

## 2012-04-24 MED ORDER — ONDANSETRON HCL 4 MG/2ML IJ SOLN
4.0000 mg | Freq: Four times a day (QID) | INTRAMUSCULAR | Status: DC | PRN
  Administered 2012-04-24: 4 mg via INTRAVENOUS
  Filled 2012-04-24: qty 2

## 2012-04-24 NOTE — Progress Notes (Signed)
[facility] PROGRESS NOTE  Elizabeth Love:[id number] DOB: 02-09-1956 DOA: 04/22/2012 PCP: No primary provider on file.  Assessment/Plan: Active Problems:  Abdominal pain  Altered mental status  Aphasia as late effect of cerebrovascular accident  Chronic systolic heart failure  Atrial fibrillation  Narcotic abuse  Abdominal pain  Discussed yesterday by Dr. Earlene Plater with patient's family and patient's primary care physician. This seem to be a chronic problem. Placed her on OxyIR 15 mg every 6 when necessary. She's had CT scans of her abdomen done in the past that is negative. Patient to followup outpatient with her primary care physician. She may need to go to a pain clinic. Restart her on a diabetic diet.  Pain with urination- U/A clean, add pyridium  Altered mental status  Patient seem to be at baseline per discussion with family. She does have some expressive aphagia and normally can only answer mostly yes or no to questions but can also talk when she wants. Per family she also has problem with long-term memory.  Aphasia as late effect of cerebrovascular accident     Chronic systolic heart failure/ Atrial fibrillation  Per cardiology seem to be stable   Narcotic abuse  Patient will need to discuss this with her primary care physician. Will limit narcotic use secondary to patient being more somnolent earlier secondary to her narcotics Use.   Diabetes  Place her on sliding scale insulin,   Right lower extremity pain  Patient may benefit from something like Neurontin. Doppler ultrasound of the lower extremities negative.  Also will check C-spine CT negative  Psych d/o?- seems to have some attention seeking behavior- defecating in floor and they saying she can not get up.  If you walk by she cries but stops when you talk to her  Code Status: full Family Communication: patient, LM for son Disposition Plan: SNF   Brief narrative: Patient is a 56 year old African American  female that was admitted to the cardiology service last night. Per discussion with cardiology patient has history of congestive heart failure with EF of 17%. Patient had come in with altered mental status and complaining of severe pain on the right side of her body where she had the stroke with severe abdominal pain on that right side of her abdomen and in the right lower extremity. Patient normally goes to [facility] was called by the ER physician and they refused the patient. Patient was admitted to the Cardiology service here. Per cardiology they would like [place] to assume care of since patient as they do not think the pain is caused by her and congestive heart failure. Discussed with patient's son and also patient's brother. Per discussion with the family patient has had pain for a long time mostly just all over her abdomen. Patient has been on chronic narcotic. She's been on MS Contin with Percocet when necessary for breakthrough pain. Per discussion with her son her primary care doctor has weaned her off the MS Contin and now she's only on Percocet and the Percocet is not holding her pain. She's been moaning all the time especially in the evenings. She was weaned off MS Contin 8 months now. Per son over the past 3-4 weeks she's been having severe abdominal pain and complaining of severe pain on the left side of her body and moaning. 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 She does move around using a cane. Per discussion with so, she was taken  to the [facility] emergency room and reviewing the [facility] emergency room records she goes frequently go there for pain medication. She's had CT scan without contrast of the abdomen and pelvis and ultrasound done of her abdomen. Per reviewing the note from the ER it seem as if she's had multiple scans done at that hospital which have been negative. She was discharged home from [facility] ER on  Friday August 2 with bowel regimen for constipation. Per discussion with bother she was given the bowel regimen and has had large bowel movement since then. Patient continues to have abdominal pain. Discussed with patient's primary care physician Dr. Kathrynn Speed at [facility]. He is very familiar with patient, he states that patient has chronic pain and normally has abdominal pain pain all over at baseline. He states that patient also does abuse narcotics. He prescribe her narcotics 03/28/2012 and patient was almost out of the narcotics. He states that if workup is negative patient can be discharged and the family should call to schedule an appointment with him for follow up. Patient had altered mental status when she presented. Per discussion with son the patient had taken her benzodiazepine and her narcotic prior to presentation. They already have an appointment scheduled for August 16 With PCP. They can call For a earlier appointment. Did discuss this with patient's son. So far all the workup has been negative patient had CT scan of the of the chest Doppler ultrasound of the lower extremity, CXR. ordered C-spine film as patient is unable to get MRI secondary to pacemaker. If this is negative then I suspect patient can follow up outpatient for further workup for and for probably referral to pain management clinic. Did discuss this with patient's son and brother. Also consulted social work to see if patient may need any home assistance. Transfer patient to telemetry   Consultants:  Psych  PT   HPI/Subjective: C/o burning with urination and lower abdominal pain Patient defecated on the floor this AM   Objective: Filed Vitals:   04/23/12 1756 04/23/12 2100 04/24/12 0500 04/24/12 1043  BP: 107/74 109/76 120/82 107/71  Pulse: 88 98 75 100  Temp: 98.2 F (36.8 C) 97.7 F (36.5 C) 97.7 F (36.5 C)   TempSrc: Oral Oral Oral   Resp: 28 20 22    Height:      Weight:   71.532 kg (157 lb 11.2  oz)   SpO2: 97% 96% 95%     Intake/Output Summary (Last 24 hours) at 04/24/12 1105 Last data filed at 04/24/12 0434  Gross per 24 hour  Intake    240 ml  Output    150 ml  Net     90 ml    Exam:   General:  Alert, cooperative, uses some words but wants to sign others  Cardiovascular: rrr  Respiratory: clear anterior, no wheezing  Abdomen: +BS, soft, NT-- had BM this AM  Data Reviewed: Basic Metabolic Panel:  Lab 04/24/12 1610 04/22/12 2236  NA 140 137  K 3.8 3.9  CL 106 107  CO2 18* 21  GLUCOSE 162* 115*  BUN 17 14  CREATININE 1.08 0.90  CALCIUM 9.5 9.6  MG -- --  PHOS -- --   Liver Function Tests:  Lab 04/24/12 0630 04/22/12 2236  AST 25 32  ALT 16 14  ALKPHOS 53 51  BILITOT 0.8 0.7  PROT 7.6 7.3  ALBUMIN 3.5 3.3*   No results found for this basename: LIPASE:5,AMYLASE:5 in  the last 168 hours  Lab 04/23/12 0820  AMMONIA 29   CBC:  Lab 04/24/12 0730 04/22/12 2236  WBC 7.6 5.3  NEUTROABS -- --  HGB 10.8* 10.4*  HCT 33.0* 31.4*  MCV 86.6 86.3  PLT 232 201   Cardiac Enzymes:  Lab 04/23/12 0820 04/22/12 2236  CKTOTAL 100 --  CKMB 2.6 --  CKMBINDEX -- --  TROPONINI <0.30 <0.30   BNP (last 3 results)  Basename 04/23/12 0232  PROBNP 3629.0*   CBG:  Lab 04/24/12 0741 04/23/12 2138 04/23/12 1831 04/23/12 1608 04/22/12 2230  GLUCAP 172* 154* 204* 133* 123*    Recent Results (from the past 240 hour(s))  MRSA PCR SCREENING     Status: Normal   Collection Time   04/23/12  6:22 AM      Component Value Range Status Comment   MRSA by PCR NEGATIVE  NEGATIVE Final      Studies: Dg Chest 2 View  04/22/2012  *RADIOLOGY REPORT*  Clinical Data: Shortness of breath, asthma  CHEST - 2 VIEW  Comparison: Of 06/18/2010  Findings: Left subclavian AICD noted.  Massive cardiac enlargement, pericardial effusion not excluded.  Diffuse interstitial edema throughout the lungs with basilar atelectasis compatible with mild CHF.  Similar appearance to the prior  study.  No pneumothorax. Trace pleural effusions suspected.  IMPRESSION: Mild CHF pattern  Original Report Authenticated By: Judie Petit. Ruel Favors, M.D.   Ct Head Wo Contrast  04/23/2012  *RADIOLOGY REPORT*  Clinical Data: Altered mental status, history of dysphasia from prior stroke with right-sided weakness, now with frontal headache  CT HEAD WITHOUT CONTRAST  Technique:  Contiguous axial images were obtained from the base of the skull through the vertex without contrast.  Comparison: None.  Findings: Examination is degraded secondary to patient motion. There is marked encephalomalacia involving the left frontal, parietal and occipital lobes, the sequela of large territorial left MCA infarction.  This finding is associated with ex vacuo dilatation of the lateral horn of the left ventricle.  There is scattered periventricular hypodensities about the right lateral ventricle compatible with microvascular ischemic disease.  Given background parenchymal abnormalities and patient motion artifact, there is no CT evidence of acute large territory infarct.  No definite intraparenchymal or extra-axial mass or hemorrhage.  Mild diffuse cortical atrophy.  No midline shift.  The paranasal sinuses and mastoid air cells are normal.  Regional soft tissues are normal.  No displaced calvarial fracture.  IMPRESSION: Degraded examination with findings of microvascular ischemic disease, atrophy and remote large territory left MCA distribution infarct without definite acute superimposed intracranial process.  Original Report Authenticated By: Waynard Reeds, M.D.   Ct Angio Chest Pe W/cm &/or Wo Cm  04/23/2012  *RADIOLOGY REPORT*  Clinical Data: Shortness of breath, asthma, stroke,  CT ANGIOGRAPHY CHEST  Technique:  Multidetector CT imaging of the chest using the standard protocol during bolus administration of intravenous contrast. Multiplanar reconstructed images including MIPs were obtained and reviewed to evaluate the vascular  anatomy.  Contrast: 80mL OMNIPAQUE IOHEXOL 350 MG/ML SOLN  Comparison: 04/22/2012 chest x-ray  Findings: Left subclavian AICD noted.  Soft tissue prominence along the battery pack in the left chest wall may represent asymmetric pectoralis musculature versus intramuscular hematoma.  Recommend correlation with exam and pacer site.  Pulmonary arteries are well visualized.  No filling defect or significant pulmonary embolus within the pulmonary arteries by CTA. Massive cardiac enlargement.  Small pericardial effusion noted. Reflux of contrast into the IVC and hepatic veins  compatible with right heart failure.  No definite abnormal adenopathy.  No hiatal hernia.  Lung windows demonstrate vascular congestion diffusely with patchy ground-glass opacities throughout the lungs and septal prominence compatible with mild edema.  No effusions.  No acute abnormal osseous finding  IMPRESSION: Negative for significant acute pulmonary embolus by CTA.  Massive four-chamber cardiac enlargement with a small pericardial effusion and early interstitial edema pattern throughout the lungs.  Evidence of right heart failure with reflux of contrast into the IVC and hepatic veins  Left pectoralis muscular thickening beneath the pacer site which may represent asymmetric musculature versus intramuscular hematoma. Recommend correlation with exam,  No inflammatory changes or abscess in the chest wall.  Original Report Authenticated By: Judie Petit. Ruel Favors, M.D.   Ct Cervical Spine Wo Contrast  04/23/2012  *RADIOLOGY REPORT*  Clinical Data: Neck pain  CT CERVICAL SPINE WITHOUT CONTRAST  Technique:  Multidetector CT imaging of the cervical spine was performed. Multiplanar CT image reconstructions were also generated.  Comparison: None.  Findings: Reversal of the normal cervical lordosis.  Mild narrowing at C4-5 and C5-6 with anterior and posterior endplate spurring. There is a   central protrusion C5-6.  Negative for fracture.  Left subclavian cardiac  leads are partially seen.  There is bilateral calcified carotid bifurcation plaque.  IMPRESSION: 1.  Negative for fracture or other acute bony abnormality. 2.  Degenerative disc disease C4-5 and C5-6. 3. Loss of the normal cervical spine lordosis, which may be secondary to positioning, spasm, or soft tissue injury. 4.  Bilateral carotid bifurcation plaque.  Original Report Authenticated By: Osa Craver, M.D.   US Venous Img Lower Unilateral Right  04/23/2012  *RADIOLOGY REPORT*  Clinical Data: Right lower leg pain and swelling.  History of arrhythmias.  Pain.  Edema.  And diabetes.  RIGHT LOWER EXTREMITY VENOUS DUPLEX ULTRASOUND  Technique:  Gray-scale sonography with graded compression, as well as color Doppler and duplex ultrasound were performed to evaluate the deep venous system of the lower extremity from the level of the common femoral vein through the popliteal and proximal calf veins. Spectral Doppler was utilized to evaluate flow at rest and with distal augmentation maneuvers.  Comparison:  None.  Findings:  Normal compressibility of the common femoral, superficial femoral, and popliteal veins is demonstrated, as well as the visualized proximal calf veins.  No filling defects to suggest DVT on grayscale or color Doppler imaging.  Doppler waveforms show normal direction of venous flow, normal respiratory phasicity and response to augmentation.  IMPRESSION: No evidence of right lower extremity deep vein thrombosis.  Original Report Authenticated By: Marlon Pel, M.D.    Scheduled Meds:   . furosemide  40 mg Oral Daily  . insulin aspart  0-9 Units Subcutaneous TID WC  . levETIRAcetam  500 mg Oral QID  . levothyroxine  75 mcg Oral QAC breakfast  . lisinopril  5 mg Oral Daily  . metoprolol tartrate  25 mg Oral BID  . pantoprazole  40 mg Oral BID AC  . sodium chloride  3 mL Intravenous Q12H  . sodium chloride  3 mL Intravenous Q12H   Continuous Infusions:   Active Problems:   Abdominal pain  Altered mental status  Aphasia as late effect of cerebrovascular accident  Chronic systolic heart failure  Atrial fibrillation  Narcotic abuse    Time spent: 45    Marlin Canary  [facility] Pager [phone].  04/24/2012, 11:05 AM  LOS: 2 days

## 2012-04-24 NOTE — Progress Notes (Signed)
CSW spoke with pt brother regarding pt discharge plans. Per chart review, pt is only oriented to self and place. Per RN, pt sometimes responds verbally other times it non verbally. Per discussion with rn and PT, pt is inconsistent with need of assistance. Pt brother states this is patient baseline since the stroke. 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  C  CSW and pt brother discussed recommendations for short term rehab at snf. Pt brother declined recommendations. Pt brother feels that patient is at baseline and pt needs can be best met at home. Pt brother asked to speak with RN case manager to assist with any needs at home.   No further csw needs, signing off.   Elizabeth Love, Theresia Majors  [phone] .04/24/2012 1347pm

## 2012-04-24 NOTE — Progress Notes (Signed)
Patient ID: Elizabeth Love, female   DOB: 10-03-1955, 56 y.o.   MRN: [id number]   Primary Cardiologist Bensimohn see consult note 04/23/12  Subjective:  Denies SSCP, palpitations or Dyspnea Aphasia and labile mood  Objective:  Filed Vitals:   04/23/12 1756 04/23/12 2100 04/24/12 0500 04/24/12 1043  BP: 107/74 109/76 120/82 107/71  Pulse: 88 98 75 100  Temp: 98.2 F (36.8 C) 97.7 F (36.5 C) 97.7 F (36.5 C)   TempSrc: Oral Oral Oral   Resp: 28 20 22    Height:      Weight:   71.532 kg (157 lb 11.2 oz)   SpO2: 97% 96% 95%     Intake/Output from previous day:  Intake/Output Summary (Last 24 hours) at 04/24/12 1131 Last data filed at 04/24/12 0434  Gross per 24 hour  Intake    240 ml  Output    150 ml  Net     90 ml    Physical Exam: Labile tearful sometimes wont talk Desheveled black female HEENT: normal Neck supple with no adenopathy JVP normal no bruits no thyromegaly Lungs clear with no wheezing and good diaphragmatic motion Heart:  S1/S2 no murmur, no rub, gallop or click PMI normal Abdomen: benighn, BS positve, no tenderness, no AAA no bruit.  No HSM or HJR Distal pulses intact with no bruits No edema Neuro aphasia with speech hard to understand Skin warm and dry No muscular weakness   Lab Results: Basic Metabolic Panel:  Basename 04/24/12 0630 04/22/12 2236  NA 140 137  K 3.8 3.9  CL 106 107  CO2 18* 21  GLUCOSE 162* 115*  BUN 17 14  CREATININE 1.08 0.90  CALCIUM 9.5 9.6  MG -- --  PHOS -- --   Liver Function Tests:  Basename 04/24/12 0630 04/22/12 2236  AST 25 32  ALT 16 14  ALKPHOS 53 51  BILITOT 0.8 0.7  PROT 7.6 7.3  ALBUMIN 3.5 3.3*   No results found for this basename: LIPASE:2,AMYLASE:2 in the last 72 hours CBC:  Basename 04/24/12 0730 04/22/12 2236  WBC 7.6 5.3  NEUTROABS -- --  HGB 10.8* 10.4*  HCT 33.0* 31.4*  MCV 86.6 86.3  PLT 232 201   Cardiac Enzymes:  Basename 04/23/12 0820 04/22/12 2236  CKTOTAL 100 --  CKMB  2.6 --  CKMBINDEX -- --  TROPONINI <0.30 <0.30    Imaging: Dg Chest 2 View  04/22/2012  *RADIOLOGY REPORT*  Clinical Data: Shortness of breath, asthma  CHEST - 2 VIEW  Comparison: Of 06/18/2010  Findings: Left subclavian AICD noted.  Massive cardiac enlargement, pericardial effusion not excluded.  Diffuse interstitial edema throughout the lungs with basilar atelectasis compatible with mild CHF.  Similar appearance to the prior study.  No pneumothorax. Trace pleural effusions suspected.  IMPRESSION: Mild CHF pattern  Original Report Authenticated By: Judie Petit. Ruel Favors, M.D.   Ct Head Wo Contrast  04/23/2012  *RADIOLOGY REPORT*  Clinical Data: Altered mental status, history of dysphasia from prior stroke with right-sided weakness, now with frontal headache  CT HEAD WITHOUT CONTRAST  Technique:  Contiguous axial images were obtained from the base of the skull through the vertex without contrast.  Comparison: None.  Findings: Examination is degraded secondary to patient motion. There is marked encephalomalacia involving the left frontal, parietal and occipital lobes, the sequela of large territorial left MCA infarction.  This finding is associated with ex vacuo dilatation of the lateral horn of the left ventricle.  There is scattered periventricular  hypodensities about the right lateral ventricle compatible with microvascular ischemic disease.  Given background parenchymal abnormalities and patient motion artifact, there is no CT evidence of acute large territory infarct.  No definite intraparenchymal or extra-axial mass or hemorrhage.  Mild diffuse cortical atrophy.  No midline shift.  The paranasal sinuses and mastoid air cells are normal.  Regional soft tissues are normal.  No displaced calvarial fracture.  IMPRESSION: Degraded examination with findings of microvascular ischemic disease, atrophy and remote large territory left MCA distribution infarct without definite acute superimposed intracranial process.   Original Report Authenticated By: Waynard Reeds, M.D.   Ct Angio Chest Pe W/cm &/or Wo Cm  04/23/2012  *RADIOLOGY REPORT*  Clinical Data: Shortness of breath, asthma, stroke,  CT ANGIOGRAPHY CHEST  Technique:  Multidetector CT imaging of the chest using the standard protocol during bolus administration of intravenous contrast. Multiplanar reconstructed images including MIPs were obtained and reviewed to evaluate the vascular anatomy.  Contrast: 80mL OMNIPAQUE IOHEXOL 350 MG/ML SOLN  Comparison: 04/22/2012 chest x-ray  Findings: Left subclavian AICD noted.  Soft tissue prominence along the battery pack in the left chest wall may represent asymmetric pectoralis musculature versus intramuscular hematoma.  Recommend correlation with exam and pacer site.  Pulmonary arteries are well visualized.  No filling defect or significant pulmonary embolus within the pulmonary arteries by CTA. Massive cardiac enlargement.  Small pericardial effusion noted. Reflux of contrast into the IVC and hepatic veins compatible with right heart failure.  No definite abnormal adenopathy.  No hiatal hernia.  Lung windows demonstrate vascular congestion diffusely with patchy ground-glass opacities throughout the lungs and septal prominence compatible with mild edema.  No effusions.  No acute abnormal osseous finding  IMPRESSION: Negative for significant acute pulmonary embolus by CTA.  Massive four-chamber cardiac enlargement with a small pericardial effusion and early interstitial edema pattern throughout the lungs.  Evidence of right heart failure with reflux of contrast into the IVC and hepatic veins  Left pectoralis muscular thickening beneath the pacer site which may represent asymmetric musculature versus intramuscular hematoma. Recommend correlation with exam,  No inflammatory changes or abscess in the chest wall.  Original Report Authenticated By: Judie Petit. Ruel Favors, M.D.   Ct Cervical Spine Wo Contrast  04/23/2012  *RADIOLOGY REPORT*   Clinical Data: Neck pain  CT CERVICAL SPINE WITHOUT CONTRAST  Technique:  Multidetector CT imaging of the cervical spine was performed. Multiplanar CT image reconstructions were also generated.  Comparison: None.  Findings: Reversal of the normal cervical lordosis.  Mild narrowing at C4-5 and C5-6 with anterior and posterior endplate spurring. There is a   central protrusion C5-6.  Negative for fracture.  Left subclavian cardiac leads are partially seen.  There is bilateral calcified carotid bifurcation plaque.  IMPRESSION: 1.  Negative for fracture or other acute bony abnormality. 2.  Degenerative disc disease C4-5 and C5-6. 3. Loss of the normal cervical spine lordosis, which may be secondary to positioning, spasm, or soft tissue injury. 4.  Bilateral carotid bifurcation plaque.  Original Report Authenticated By: Osa Craver, M.D.   US Venous Img Lower Unilateral Right  04/23/2012  *RADIOLOGY REPORT*  Clinical Data: Right lower leg pain and swelling.  History of arrhythmias.  Pain.  Edema.  And diabetes.  RIGHT LOWER EXTREMITY VENOUS DUPLEX ULTRASOUND  Technique:  Gray-scale sonography with graded compression, as well as color Doppler and duplex ultrasound were performed to evaluate the deep venous system of the lower extremity from the level of  the common femoral vein through the popliteal and proximal calf veins. Spectral Doppler was utilized to evaluate flow at rest and with distal augmentation maneuvers.  Comparison:  None.  Findings:  Normal compressibility of the common femoral, superficial femoral, and popliteal veins is demonstrated, as well as the visualized proximal calf veins.  No filling defects to suggest DVT on grayscale or color Doppler imaging.  Doppler waveforms show normal direction of venous flow, normal respiratory phasicity and response to augmentation.  IMPRESSION: No evidence of right lower extremity deep vein thrombosis.  Original Report Authenticated By: Marlon Pel,  M.D.    Cardiac Studies:  ECG: Afib flutter rate 104      Echo: 04/23/12 Study Conclusions  - Left ventricle: The cavity size was severely dilated. Wall thickness was increased in a pattern of mild LVH. The estimated ejection fraction was 10%. Diffuse hypokinesis. - Mitral valve: Severe regurgitation. - Left atrium: The atrium was severely dilated. - Right ventricle: The cavity size was mildly dilated. - Right atrium: The atrium was mildly dilated. - Tricuspid valve: Moderate regurgitation. - Pulmonary arteries: PA peak pressure: 39mm Hg (S). - Pericardium, extracardiac: A small, free-flowing pericardial effusion was identified. The fluid had no internal echoes.There was no evidence of hemodynamic compromise.    Medications:     . furosemide  40 mg Oral Daily  . insulin aspart  0-9 Units Subcutaneous TID WC  . levETIRAcetam  500 mg Oral QID  . levothyroxine  75 mcg Oral QAC breakfast  . lisinopril  5 mg Oral Daily  . metoprolol tartrate  25 mg Oral BID  . pantoprazole  40 mg Oral BID AC  . phenazopyridine  100 mg Oral TID WC  . sodium chloride  3 mL Intravenous Q12H  . sodium chloride  3 mL Intravenous Q12H       Assessment/Plan:  CHF:  Euvolemic stable continue current meds Afib:  Continue beta blocker despite previous CVA does not appear to be candidate for anticoagulation due to behavioral issues MS:  Per primary service ? Secondary to pain meds and previous CVA    Charlton Haws 04/24/2012, 11:31 AM

## 2012-04-24 NOTE — Progress Notes (Signed)
Progress note for Consultation Pt is standing slowly moving to chair by bed.  She is dyspneic, audible respirations and unresponsive to greeting and questions.  She begins pointing and is non-verbal.  When she speaks it is very difficult to understand her.  She clutches her upper right chest and frowns.   She indicates she is having chest pain.  Interview is not possible with patient in this non-verbal, claiming pain by gesture condition.  RECOMMENDATION:  1.  RN notified about patient distress.  RN leaves to check VS 2.  Dr. Benjamine Mola notified to inform  Evaluation/consultation to be repeated in am.  Also told Dr. Theola Sequin says pt is refusing to take pain medication but c/o abd pain.  Elizabeth Steven J. Ferol Luz, MD Psychiatrist  04/24/2012 2:51 PM  .

## 2012-04-24 NOTE — Evaluation (Signed)
Physical Therapy Evaluation Patient Details Name: Elizabeth Love MRN: [id number] DOB: 04/23/1956 Today's Date: 04/24/2012  PT Assessment / Plan / Recommendation Clinical Impression  Pt admitted for abdominal pain. Pt with previous CVA with right sided weakness. Pt is aphasic, although is able to speak when given cues. Pt with inconsitent mobility this session, unsure to determine if pt is at her baseline as no family is present. Will continue to assess in further sessions. Pt will benefit from skilled PT in the acute care setting in order to maximize functional mobility and safety. Pt would benefit from a SNF prior to d/c home for safety.    PT Assessment  Patient needs continued PT services    Follow Up Recommendations  Skilled nursing facility;Supervision/Assistance - 24 hour    Barriers to Discharge        Equipment Recommendations  Defer to next venue    Recommendations for Other Services     Frequency Min 3X/week    Precautions / Restrictions Restrictions Weight Bearing Restrictions: No         Mobility  Bed Mobility Bed Mobility: Not assessed Transfers Transfers: Stand to Sit;Sit to Stand Sit to Stand: 4: Min assist;With upper extremity assist;From chair/3-in-1 Stand to Sit: 4: Min assist;With upper extremity assist;To chair/3-in-1 Details for Transfer Assistance: VC for hand placement. At first attempt, pt unable to stand up. NT in room encouraging pt(pt stood up by herself earlier today). Pt with min assist for stability was able to stand/sit. Cueing throughout for safety Ambulation/Gait Ambulation/Gait Assistance: 4: Min assist;3: Mod assist Ambulation Distance (Feet): 25 Feet Assistive device: 1 person hand held assist Ambulation/Gait Assistance Details: With 1 hand held assist, pt reaching out for all objects with L hand, difficulty to get pt to maintain HHA.  Pt resisting PT holding onto gait belt, PT explained throughout ambulation that gait belt is used for safety  and stabiltiy of pt Gait Pattern: Decreased hip/knee flexion - right;Decreased stance time - right;Decreased step length - left;Decreased dorsiflexion - right;Decreased weight shift to right;Trunk flexed;Narrow base of support Gait velocity: decreased gait speed    Exercises     PT Diagnosis: Difficulty walking;Abnormality of gait;Altered mental status  PT Problem List: Decreased strength;Decreased activity tolerance;Decreased balance;Decreased mobility;Decreased knowledge of use of DME;Decreased safety awareness;Decreased knowledge of precautions PT Treatment Interventions: DME instruction;Gait training;Functional mobility training;Therapeutic activities;Therapeutic exercise;Balance training;Neuromuscular re-education;Patient/family education   PT Goals Acute Rehab PT Goals PT Goal Formulation: Patient unable to participate in goal setting Time For Goal Achievement: 05/08/12 Potential to Achieve Goals: Fair Pt will go Supine/Side to Sit: with modified independence PT Goal: Supine/Side to Sit - Progress: Goal set today Pt will go Sit to Supine/Side: with modified independence PT Goal: Sit to Supine/Side - Progress: Goal set today Pt will go Sit to Stand: with supervision PT Goal: Sit to Stand - Progress: Goal set today Pt will go Stand to Sit: with supervision PT Goal: Stand to Sit - Progress: Goal set today Pt will Transfer Bed to Chair/Chair to Bed: with supervision PT Transfer Goal: Bed to Chair/Chair to Bed - Progress: Goal set today Pt will Ambulate: >150 feet;with supervision;with least restrictive assistive device PT Goal: Ambulate - Progress: Goal set today  Visit Information  Last PT Received On: 04/24/12    Subjective Data      Prior Functioning  Home Living Lives With: Family;Son Available Help at Discharge: Family;Other (Comment) (son works during the day) Type of Home: House Additional Comments: pt unable to give  full answers secondary to cognition and  aphasia Prior Function Level of Independence: Needs assistance Driving: No Vocation: On disability Comments: unsure of assistance level secondary to pt with decreased cognition and aphasia. R sided weakness from previous CVA Communication Communication: Expressive difficulties    Cognition  Overall Cognitive Status: No family/caregiver present to determine baseline cognitive functioning Cognition - Other Comments: pt "miming" when she is able to speak words. Cueing for verbalization. Pt also inconsitent with motor behaviors.    Extremity/Trunk Assessment Right Lower Extremity Assessment RLE ROM/Strength/Tone: Deficits RLE ROM/Strength/Tone Deficits: previous impairments from CVA, unable to fully assess secondary to cognitive deficits Left Lower Extremity Assessment LLE ROM/Strength/Tone: Within functional levels   Balance    End of Session PT - End of Session Equipment Utilized During Treatment: Gait belt Activity Tolerance: Other (comment) (limited by cognition) Patient left: in chair;with call bell/phone within reach;Other (comment);with chair alarm set Psychiatrist in room) Nurse Communication: Mobility status    Milana Kidney 04/24/2012, 10:04 AM

## 2012-04-25 LAB — GLUCOSE, CAPILLARY: Glucose-Capillary: 127 mg/dL — ABNORMAL HIGH (ref 70–99)

## 2012-04-25 MED ORDER — FUROSEMIDE 40 MG PO TABS: 40.0000 mg | ORAL_TABLET | Freq: Every day | ORAL | Status: DC

## 2012-04-25 MED ORDER — BUSPIRONE HCL 10 MG PO TABS: 10.0000 mg | ORAL_TABLET | Freq: Every morning | ORAL | Status: DC

## 2012-04-25 MED ORDER — GLIPIZIDE ER 5 MG PO TB24
5.0000 mg | ORAL_TABLET | Freq: Two times a day (BID) | ORAL | Status: DC
  Administered 2012-04-25: 5 mg via ORAL
  Filled 2012-04-25 (×2): qty 1

## 2012-04-25 MED ORDER — OXYCODONE-ACETAMINOPHEN 10-325 MG PO TABS: 1.0000 | ORAL_TABLET | Freq: Three times a day (TID) | ORAL | Status: DC | PRN

## 2012-04-25 NOTE — Progress Notes (Signed)
Progress Note in lieu of Consultation for this pt who has intermittent aphasia following CVA Discussed with Dr. Zenaida Niece.  Pt's family wishes pt to return to home instead of going to SNF.  The brother and home care services provide care at home.  Dr. Zenaida Niece says she was told that the patient is the same at home; intermittent ability?/willingness to speak.  It is reported that she has periods of anxiety.   Pt makes no attempt to speak for evaluation.  RECOMMENDATION:  1.  Suggest Buspar, buspirone, 10 mg 3 X daily.  2.  No further psychiatric needs identified Avion Patella J. Ferol Luz, MD Psychiatrist  04/25/2012  12:31 PM

## 2012-04-25 NOTE — Discharge Instructions (Signed)
[facility] RN/PT arranged with [facility]- [phone]

## 2012-04-25 NOTE — Discharge Summary (Signed)
Physician Discharge Summary  Elizabeth Love ZOX:[id number] DOB: 05-24-1956 DOA: 04/22/2012  PCP: No primary provider on file.  Admit date: 04/22/2012 Discharge date: 04/25/2012  Recommendations for Outpatient Follow-up:  Home health- PT/RN Needs close outpatient follow up Titrate buspar up to 10 mg TID  Discharge Diagnoses:  Active Problems:  Abdominal pain  Altered mental status  Aphasia as late effect of cerebrovascular accident  Chronic systolic heart failure  Atrial fibrillation  Narcotic abuse   Discharge Condition: improved  Diet recommendation: cardiac  Wt Readings from Last 3 Encounters:  04/24/12 71.532 kg (157 lb 11.2 oz)    History of present illness:  56 y/o F with reported hx of LV dysfunction s/p ICD, seizures, 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 The patient was initially obtunded upon receipt of transfer to [facility] and unable to provide a history. She is more alert now but still unable to provide information. We have spoken with Dr. Nicanor Alcon (EDP) as well as the patient's brother for history. Per these discussions, the patient was seen in the [facility] ER last week for flank pain, had abdominal x-ray and was found to be dehydrated and constipated and prescribed Mag Citrate. Since that time she has not been herself. 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 However, for the past 2 days he has noticed a change in her mental status and as of last night was screaming persistently yesterday due to pain. He does not think she took extra Percocet or Xanax than prescribed. He also reports she appeared more SOB. He does not believe she had chest pain. Per EDP, when  she came in she was alert and able to talk to triage but would occasionally have hypermanic episodes where she would scream and become very agitated - this alternated with episodes of obtundation. Per Dr. Nicanor Alcon, the patient verbally refused ativan so has not gotten any narcotics or sedatives thus far. She did receive IV toradol. She still moans and complains of R flank pain, L foot pain, and headache. Dr. Nicanor Alcon reports that she attempted to get the patient admitted to internal medicine at [facility] where all of her care has been as well as IM at [facility], but was refused.  EKG demonstrated coarse atrial fib 113bpm with right axis deviation, no prior to compare to. Per discussion with Dr. Nicanor Alcon, the patient's records from [facility] ER visit last week indicated she has a hx of afib but the patient's brother is unaware of such. pBNP 3629. PT/INR are elevated at baseline 19.4/INR 1.61. LFTs WNL except albumin 3.3. Troponin neg x 1. H/H 10.4/31.4. CT angio demonstrated no PE, did show 4-chamber cardiac enlargement with small pericardial effusion and early interstitial edema, evidence of RHF with reflux of contrast into IVC and hepatic veins, L pec muscle thickening beneath pacer site that may represent asymmetric musculature versus intramuscular hematoma without abscess or inflammatory changes. LE venous doppler was negative.   Hospital Course:  Abdominal pain  Discussed yesterday by Dr. Earlene Plater with patient's family and patient's primary care physician. This seem to be a chronic problem. Placed her on OxyIR 15 mg every 6 when necessary. She's had CT scans  of her abdomen done in the past that is negative. Patient to followup outpatient with her primary care physician. She may need to go to a pain clinic. Restart her on a diabetic diet- eating well   Pain with urination- U/A clean  Altered mental status  Patient seem to be at baseline per discussion with family. She does have some expressive  aphagia and normally can only answer mostly yes or no to questions but can also talk when she wants. Per family she also has problem with long-term memory.  Aphasia as late effect of cerebrovascular accident   Chronic systolic heart failure/ Atrial fibrillation  Per cardiology seem to be stable , needs close follow up  Narcotic abuse  Patient will need to discuss this with her primary care physician. Will limit narcotic use secondary to patient being more somnolent earlier secondary to her narcotics Use.   Diabetes  Resume home medications   Right lower extremity pain   Doppler ultrasound of the lower extremities negative.  C-spine CT negative   Psych d/o?- seems to have some attention seeking behavior- defecating in floor and they saying she can not get up. If you walk by she cries but stops when you talk to her, added buspar   Consultations:  cardiology  Discharge Exam: Filed Vitals:   04/25/12 0927  BP: 113/74  Pulse: 104  Temp:   Resp:    Filed Vitals:   04/24/12 1422 04/24/12 2100 04/25/12 0500 04/25/12 0927  BP: 105/65 108/71 90/54 113/74  Pulse: 78 93 103 104  Temp: 97.8 F (36.6 C) 98.4 F (36.9 C) 98.2 F (36.8 C)   TempSrc: Oral Oral Oral   Resp: 18 18 18    Height:      Weight:      SpO2: 98% 96% 96%     General: A+Ox3, NAD Cardiovascular: rrr Respiratory: clear anterior  Patient not verbal  Discharge Instructions  Discharge Orders    Future Orders Please Complete By Expires   Diet general      Diet - low sodium heart healthy      Diet Carb Modified      Increase activity slowly      Heart Failure patients record your daily weight using the same scale at the same time of day      Discharge instructions      Comments:   Needs close follow up by PCP- need CMP and CBC in 1 week by PCP Continue with 24 hour care and sitters Home health PT and RN- help with medications   Beta Blocker already ordered      (HEART FAILURE PATIENTS) Call MD:  Anytime  you have any of the following symptoms: 1) 3 pound weight gain in 24 hours or 5 pounds in 1 week 2) shortness of breath, with or without a dry hacking cough 3) swelling in the hands, feet or stomach 4) if you have to sleep on extra pillows at night in order to breathe.      ACE Inhibitor / ARB already ordered        Medication List  As of 04/25/2012  1:50 PM   STOP taking these medications         terazosin 2 MG capsule         TAKE these medications         acetaminophen 650 MG CR tablet   Commonly known as: TYLENOL   Take 650 mg by mouth every 8 (eight) hours as  needed. For pain      albuterol (2.5 MG/3ML) 0.083% nebulizer solution   Commonly known as: PROVENTIL   Take 2.5 mg by nebulization every 6 (six) hours as needed. For cough or wheeze      ALPRAZolam 0.25 MG tablet   Commonly known as: XANAX   Take 0.25 mg by mouth at bedtime as needed. For sleep      busPIRone 10 MG tablet   Commonly known as: BUSPAR   Take 1 tablet (10 mg total) by mouth every morning.      furosemide 40 MG tablet   Commonly known as: LASIX   Take 1 tablet (40 mg total) by mouth daily.      glipiZIDE 10 MG 24 hr tablet   Commonly known as: GLUCOTROL XL   Take 10 mg by mouth 2 (two) times daily.      levETIRAcetam 500 MG tablet   Commonly known as: KEPPRA   Take 500 mg by mouth 4 (four) times daily.      levothyroxine 75 MCG tablet   Commonly known as: SYNTHROID, LEVOTHROID   Take 75 mcg by mouth daily.      lisinopril 5 MG tablet   Commonly known as: PRINIVIL,ZESTRIL   Take 5 mg by mouth daily.      metoprolol tartrate 25 MG tablet   Commonly known as: LOPRESSOR   Take 25 mg by mouth 2 (two) times daily.      omeprazole 20 MG capsule   Commonly known as: PRILOSEC   Take 20 mg by mouth 2 (two) times daily.      oxyCODONE-acetaminophen 10-325 MG per tablet   Commonly known as: PERCOCET   Take 1 tablet by mouth 3 (three) times daily as needed for pain. For pain            Follow-up Information    Please follow up. (PCP in 1 week for CMP and CBC)           The results of significant diagnostics from this hospitalization (including imaging, microbiology, ancillary and laboratory) are listed below for reference.    Significant Diagnostic Studies: Dg Chest 2 View  04/22/2012  *RADIOLOGY REPORT*  Clinical Data: Shortness of breath, asthma  CHEST - 2 VIEW  Comparison: Of 06/18/2010  Findings: Left subclavian AICD noted.  Massive cardiac enlargement, pericardial effusion not excluded.  Diffuse interstitial edema throughout the lungs with basilar atelectasis compatible with mild CHF.  Similar appearance to the prior study.  No pneumothorax. Trace pleural effusions suspected.  IMPRESSION: Mild CHF pattern  Original Report Authenticated By: Judie Petit. Ruel Favors, M.D.   Ct Head Wo Contrast  04/23/2012  *RADIOLOGY REPORT*  Clinical Data: Altered mental status, history of dysphasia from prior stroke with right-sided weakness, now with frontal headache  CT HEAD WITHOUT CONTRAST  Technique:  Contiguous axial images were obtained from the base of the skull through the vertex without contrast.  Comparison: None.  Findings: Examination is degraded secondary to patient motion. There is marked encephalomalacia involving the left frontal, parietal and occipital lobes, the sequela of large territorial left MCA infarction.  This finding is associated with ex vacuo dilatation of the lateral horn of the left ventricle.  There is scattered periventricular hypodensities about the right lateral ventricle compatible with microvascular ischemic disease.  Given background parenchymal abnormalities and patient motion artifact, there is no CT evidence of acute large territory infarct.  No definite intraparenchymal or extra-axial mass or hemorrhage.  Mild diffuse cortical  atrophy.  No midline shift.  The paranasal sinuses and mastoid air cells are normal.  Regional soft tissues are normal.  No displaced  calvarial fracture.  IMPRESSION: Degraded examination with findings of microvascular ischemic disease, atrophy and remote large territory left MCA distribution infarct without definite acute superimposed intracranial process.  Original Report Authenticated By: Waynard Reeds, M.D.   Ct Angio Chest Pe W/cm &/or Wo Cm  04/23/2012  *RADIOLOGY REPORT*  Clinical Data: Shortness of breath, asthma, stroke,  CT ANGIOGRAPHY CHEST  Technique:  Multidetector CT imaging of the chest using the standard protocol during bolus administration of intravenous contrast. Multiplanar reconstructed images including MIPs were obtained and reviewed to evaluate the vascular anatomy.  Contrast: 80mL OMNIPAQUE IOHEXOL 350 MG/ML SOLN  Comparison: 04/22/2012 chest x-ray  Findings: Left subclavian AICD noted.  Soft tissue prominence along the battery pack in the left chest wall may represent asymmetric pectoralis musculature versus intramuscular hematoma.  Recommend correlation with exam and pacer site.  Pulmonary arteries are well visualized.  No filling defect or significant pulmonary embolus within the pulmonary arteries by CTA. Massive cardiac enlargement.  Small pericardial effusion noted. Reflux of contrast into the IVC and hepatic veins compatible with right heart failure.  No definite abnormal adenopathy.  No hiatal hernia.  Lung windows demonstrate vascular congestion diffusely with patchy ground-glass opacities throughout the lungs and septal prominence compatible with mild edema.  No effusions.  No acute abnormal osseous finding  IMPRESSION: Negative for significant acute pulmonary embolus by CTA.  Massive four-chamber cardiac enlargement with a small pericardial effusion and early interstitial edema pattern throughout the lungs.  Evidence of right heart failure with reflux of contrast into the IVC and hepatic veins  Left pectoralis muscular thickening beneath the pacer site which may represent asymmetric musculature versus  intramuscular hematoma. Recommend correlation with exam,  No inflammatory changes or abscess in the chest wall.  Original Report Authenticated By: Judie Petit. Ruel Favors, M.D.   Ct Cervical Spine Wo Contrast  04/23/2012  *RADIOLOGY REPORT*  Clinical Data: Neck pain  CT CERVICAL SPINE WITHOUT CONTRAST  Technique:  Multidetector CT imaging of the cervical spine was performed. Multiplanar CT image reconstructions were also generated.  Comparison: None.  Findings: Reversal of the normal cervical lordosis.  Mild narrowing at C4-5 and C5-6 with anterior and posterior endplate spurring. There is a   central protrusion C5-6.  Negative for fracture.  Left subclavian cardiac leads are partially seen.  There is bilateral calcified carotid bifurcation plaque.  IMPRESSION: 1.  Negative for fracture or other acute bony abnormality. 2.  Degenerative disc disease C4-5 and C5-6. 3. Loss of the normal cervical spine lordosis, which may be secondary to positioning, spasm, or soft tissue injury. 4.  Bilateral carotid bifurcation plaque.  Original Report Authenticated By: Osa Craver, M.D.   US Venous Img Lower Unilateral Right  04/23/2012  *RADIOLOGY REPORT*  Clinical Data: Right lower leg pain and swelling.  History of arrhythmias.  Pain.  Edema.  And diabetes.  RIGHT LOWER EXTREMITY VENOUS DUPLEX ULTRASOUND  Technique:  Gray-scale sonography with graded compression, as well as color Doppler and duplex ultrasound were performed to evaluate the deep venous system of the lower extremity from the level of the common femoral vein through the popliteal and proximal calf veins. Spectral Doppler was utilized to evaluate flow at rest and with distal augmentation maneuvers.  Comparison:  None.  Findings:  Normal compressibility of the common femoral, superficial femoral, and popliteal veins  is demonstrated, as well as the visualized proximal calf veins.  No filling defects to suggest DVT on grayscale or color Doppler imaging.  Doppler  waveforms show normal direction of venous flow, normal respiratory phasicity and response to augmentation.  IMPRESSION: No evidence of right lower extremity deep vein thrombosis.  Original Report Authenticated By: Marlon Pel, M.D.    Microbiology: Recent Results (from the past 240 hour(s))  MRSA PCR SCREENING     Status: Normal   Collection Time   04/23/12  6:22 AM      Component Value Range Status Comment   MRSA by PCR NEGATIVE  NEGATIVE Final   URINE CULTURE     Status: Normal   Collection Time   04/24/12  2:14 AM      Component Value Range Status Comment   Specimen Description URINE, RANDOM   Final    Special Requests NONE   Final    Culture  Setup Time 04/24/2012 02:44   Final    Colony Count NO GROWTH   Final    Culture NO GROWTH   Final    Report Status 04/24/2012 FINAL   Final      Labs: Basic Metabolic Panel:  Lab 04/24/12 1610 04/22/12 2236  NA 140 137  K 3.8 3.9  CL 106 107  CO2 18* 21  GLUCOSE 162* 115*  BUN 17 14  CREATININE 1.08 0.90  CALCIUM 9.5 9.6  MG -- --  PHOS -- --   Liver Function Tests:  Lab 04/24/12 0630 04/22/12 2236  AST 25 32  ALT 16 14  ALKPHOS 53 51  BILITOT 0.8 0.7  PROT 7.6 7.3  ALBUMIN 3.5 3.3*   No results found for this basename: LIPASE:5,AMYLASE:5 in the last 168 hours  Lab 04/23/12 0820  AMMONIA 29   CBC:  Lab 04/24/12 0730 04/22/12 2236  WBC 7.6 5.3  NEUTROABS -- --  HGB 10.8* 10.4*  HCT 33.0* 31.4*  MCV 86.6 86.3  PLT 232 201   Cardiac Enzymes:  Lab 04/23/12 0820 04/22/12 2236  CKTOTAL 100 --  CKMB 2.6 --  CKMBINDEX -- --  TROPONINI <0.30 <0.30   BNP: BNP (last 3 results)  Basename 04/23/12 0232  PROBNP 3629.0*   CBG:  Lab 04/25/12 1122 04/25/12 0729 04/24/12 2049 04/24/12 1623 04/24/12 1137  GLUCAP 127* 127* 99 115* 133*    Time coordinating discharge: 45 minutes  Signed:  Benjamine Mola, JESSICA  [facility] 04/25/2012, 1:50 PM

## 2012-04-25 NOTE — Progress Notes (Signed)
Patient very labile. Tech and nursing both indicated that pt. Is discharging. No need for OT evaluation.

## 2012-04-25 NOTE — Progress Notes (Signed)
DC orders received.  Discharge and medication instructions reviewed with patient and patient's brother (caregiver).  Patient DC home with brother. Elizabeth Love

## 2012-04-26 NOTE — Progress Notes (Signed)
I agree with the following treatment note after reviewing documentation.   Johnston, Derry Kassel Brynn   OTR/L Pager: [phone] Office: [phone] .   

## 2012-04-29 ENCOUNTER — Encounter (HOSPITAL_BASED_OUTPATIENT_CLINIC_OR_DEPARTMENT_OTHER): Payer: Self-pay | Admitting: *Deleted

## 2012-04-29 ENCOUNTER — Emergency Department (HOSPITAL_BASED_OUTPATIENT_CLINIC_OR_DEPARTMENT_OTHER)
Admission: EM | Admit: 2012-04-29 | Discharge: 2012-04-29 | Disposition: A | Discharge status: Home / Self Care | Payer: Medicare Other | Attending: Emergency Medicine | Admitting: Emergency Medicine

## 2012-04-29 DIAGNOSIS — J45909 Unspecified asthma, uncomplicated: Secondary | ICD-10-CM | POA: Insufficient documentation

## 2012-04-29 DIAGNOSIS — E079 Disorder of thyroid, unspecified: Secondary | ICD-10-CM | POA: Insufficient documentation

## 2012-04-29 DIAGNOSIS — E119 Type 2 diabetes mellitus without complications: Secondary | ICD-10-CM | POA: Insufficient documentation

## 2012-04-29 DIAGNOSIS — K5900 Constipation, unspecified: Secondary | ICD-10-CM

## 2012-04-29 DIAGNOSIS — Z87891 Personal history of nicotine dependence: Secondary | ICD-10-CM | POA: Insufficient documentation

## 2012-04-29 DIAGNOSIS — Z8673 Personal history of transient ischemic attack (TIA), and cerebral infarction without residual deficits: Secondary | ICD-10-CM | POA: Insufficient documentation

## 2012-04-29 DIAGNOSIS — I10 Essential (primary) hypertension: Secondary | ICD-10-CM | POA: Insufficient documentation

## 2012-04-29 NOTE — ED Notes (Signed)
Pt's husband states he gave her a bottle of mag citrate at 1200 Sunday, after arriving to the ed the patient was able to expel some hard stool, small amount per pt.

## 2012-04-29 NOTE — ED Provider Notes (Signed)
History     CSN: [id number]  Arrival date & time 04/29/12  9147   First MD Initiated Contact with Patient 04/29/12 [phone]      Chief Complaint  Patient presents with  . Constipation    (Consider location/radiation/quality/duration/timing/severity/associated sxs/prior treatment) HPI Level 5 Caveat: aphasia. This is a 56 year old black female who was recently admitted to the hospital for pulmonary edema. She was discharged 2 days ago. She is here with a complaint of not having had a bowel movement since discharge. This is been associated with abdominal cramping. She was given a bottle been using citrate yesterday about noon. There been no change so her husband brought her here. Awaiting to be seen she had a moderate-sized bowel movement with relief of her symptoms. She has pain in her right ankle and is scheduled to see her primary care physician as well as her pain management physician today.  Past Medical History  Diagnosis Date  . Seizures   . Hypertension   . Stroke     2007 - R sided weakness  . Asthma   . Diabetes mellitus   . 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 Thyroid disease   . LV dysfunction     Per brother, EF 17% s/p AICD 2007 ([employer]). No hx CAD.    Past Surgical History  Procedure Date  . Cholecystectomy   . Appendectomy   . Pacemaker insertion     Family History  Problem Relation Age of Onset  . Coronary artery disease Neg Hx     History  Substance Use Topics  . Smoking status: Former Games developer  . Smokeless tobacco: Not on file  . Alcohol Use: No    OB History    Grav Para Term Preterm Abortions TAB SAB Ect Mult Living                  Review of Systems  Unable to perform ROS   Allergies  Penicillins and Sulfa antibiotics  Home Medications   Current Outpatient Rx  Name Route Sig Dispense Refill  . ACETAMINOPHEN ER 650 MG PO TBCR Oral Take 650 mg by mouth every 8 (eight) hours as needed. For pain    . ALBUTEROL SULFATE (2.5  MG/3ML) 0.083% IN NEBU Nebulization Take 2.5 mg by nebulization every 6 (six) hours as needed. For cough or wheeze    . ALPRAZOLAM 0.25 MG PO TABS Oral Take 0.25 mg by mouth at bedtime as needed. For sleep    . BUSPIRONE HCL 10 MG PO TABS Oral Take 1 tablet (10 mg total) by mouth every morning. 30 tablet 0  . FUROSEMIDE 40 MG PO TABS Oral Take 1 tablet (40 mg total) by mouth daily. 30 tablet 1  . GLIPIZIDE ER 10 MG PO TB24 Oral Take 10 mg by mouth 2 (two) times daily.    Marland Kitchen LEVETIRACETAM 500 MG PO TABS Oral Take 500 mg by mouth 4 (four) times daily.     Marland Kitchen LEVOTHYROXINE SODIUM 75 MCG PO TABS Oral Take 75 mcg by mouth daily.    Marland Kitchen LISINOPRIL 5 MG PO TABS Oral Take 5 mg by mouth daily.    Marland Kitchen METOPROLOL TARTRATE 25 MG PO TABS Oral Take 25 mg by mouth 2 (two) times daily.    Marland Kitchen OMEPRAZOLE 20 MG PO CPDR Oral Take 20 mg by mouth 2 (two) times daily.    . OXYCODONE-ACETAMINOPHEN 10-325 MG PO TABS Oral Take 1 tablet by mouth 3 (  three) times daily as needed for pain. For pain 15 tablet 0    BP 97/69  Temp 97.7 F (36.5 C) (Oral)  Resp 22  SpO2 98%  Physical Exam General: Well-developed, well-nourished female in no acute distress; appearance consistent with age of record HENT: normocephalic, atraumatic Eyes: pupils equal round and reactive to light; extraocular muscles intact Neck: supple Heart: regular rate and rhythm Lungs: clear to auscultation bilaterally Abdomen: soft; nondistended; mild diffuse tenderness; bowel sounds present Rectal: No stool in vault Extremities: No deformity; right contracture Neurologic: Awake, alert; right hemiparesis Skin: Warm and dry     ED Course  Procedures (including critical care time)     MDM  Patient husband was advised that as she is on chronic narcotics she may benefit from a stool softener or MiraLAX. He should address this with her pain management physician today.        Hanley Seamen, MD 04/29/12 [phone]

## 2012-04-29 NOTE — ED Notes (Signed)
Patient and husband states patient has had constipation for the last 2 days since being discharged from the hospital.

## 2012-04-29 NOTE — ED Notes (Signed)
Pt presents to ED today with constipation for the last 2 days after being released from hospital.  Pt was given mag citrate at home by family with minimal relief

## 2012-05-02 ENCOUNTER — Encounter (HOSPITAL_COMMUNITY): Payer: Self-pay | Admitting: *Deleted

## 2012-05-02 ENCOUNTER — Emergency Department (HOSPITAL_COMMUNITY)
Admission: EM | Admit: 2012-05-02 | Discharge: 2012-05-02 | Disposition: A | Discharge status: Home / Self Care | Payer: Medicare Other | Attending: Emergency Medicine | Admitting: Emergency Medicine

## 2012-05-02 ENCOUNTER — Emergency Department (HOSPITAL_COMMUNITY): Payer: Medicare Other

## 2012-05-02 DIAGNOSIS — E119 Type 2 diabetes mellitus without complications: Secondary | ICD-10-CM | POA: Insufficient documentation

## 2012-05-02 DIAGNOSIS — I472 Ventricular tachycardia, unspecified: Secondary | ICD-10-CM | POA: Insufficient documentation

## 2012-05-02 DIAGNOSIS — R1032 Left lower quadrant pain: Secondary | ICD-10-CM | POA: Insufficient documentation

## 2012-05-02 DIAGNOSIS — J45909 Unspecified asthma, uncomplicated: Secondary | ICD-10-CM | POA: Insufficient documentation

## 2012-05-02 DIAGNOSIS — I4729 Other ventricular tachycardia: Secondary | ICD-10-CM | POA: Insufficient documentation

## 2012-05-02 DIAGNOSIS — Z8673 Personal history of transient ischemic attack (TIA), and cerebral infarction without residual deficits: Secondary | ICD-10-CM | POA: Insufficient documentation

## 2012-05-02 DIAGNOSIS — R0602 Shortness of breath: Secondary | ICD-10-CM | POA: Insufficient documentation

## 2012-05-02 DIAGNOSIS — R51 Headache: Secondary | ICD-10-CM | POA: Insufficient documentation

## 2012-05-02 DIAGNOSIS — Z79899 Other long term (current) drug therapy: Secondary | ICD-10-CM | POA: Insufficient documentation

## 2012-05-02 DIAGNOSIS — I10 Essential (primary) hypertension: Secondary | ICD-10-CM | POA: Insufficient documentation

## 2012-05-02 DIAGNOSIS — Z950 Presence of cardiac pacemaker: Secondary | ICD-10-CM | POA: Insufficient documentation

## 2012-05-02 LAB — URINE MICROSCOPIC-ADD ON

## 2012-05-02 LAB — URINALYSIS, ROUTINE W REFLEX MICROSCOPIC
Bilirubin Urine: NEGATIVE
Ketones, ur: NEGATIVE mg/dL
Nitrite: NEGATIVE
Protein, ur: >300 mg/dL — AB
Specific Gravity, Urine: 1.015 (ref 1.005–1.030)
Urobilinogen, UA: 0.2 mg/dL (ref 0.0–1.0)

## 2012-05-02 LAB — CBC WITH DIFFERENTIAL/PLATELET
Basophils Absolute: 0 10*3/uL (ref 0.0–0.1)
HCT: 31.9 % — ABNORMAL LOW (ref 36.0–46.0)
Hemoglobin: 10.3 g/dL — ABNORMAL LOW (ref 12.0–15.0)
Lymphocytes Relative: 25 % (ref 12–46)
Monocytes Absolute: 0.7 10*3/uL (ref 0.1–1.0)
Monocytes Relative: 10 % (ref 3–12)
Neutro Abs: 4.5 10*3/uL (ref 1.7–7.7)
Neutrophils Relative %: 64 % (ref 43–77)
WBC: 7.1 10*3/uL (ref 4.0–10.5)

## 2012-05-02 LAB — COMPREHENSIVE METABOLIC PANEL
ALT: 14 U/L (ref 0–35)
AST: 24 U/L (ref 0–37)
Alkaline Phosphatase: 50 U/L (ref 39–117)
CO2: 23 mEq/L (ref 19–32)
Calcium: 9.6 mg/dL (ref 8.4–10.5)
Chloride: 104 mEq/L (ref 96–112)
GFR calc Af Amer: 71 mL/min — ABNORMAL LOW (ref >90)
GFR calc non Af Amer: 62 mL/min — ABNORMAL LOW (ref >90)
Glucose, Bld: 105 mg/dL — ABNORMAL HIGH (ref 70–99)
Sodium: 140 mEq/L (ref 135–145)
Total Bilirubin: 1 mg/dL (ref 0.3–1.2)

## 2012-05-02 LAB — POCT I-STAT, CHEM 8
BUN: 18 mg/dL (ref 6–23)
Calcium, Ion: 1.2 mmol/L (ref 1.12–1.23)
Chloride: 107 mEq/L (ref 96–112)
HCT: 35 % — ABNORMAL LOW (ref 36.0–46.0)
Potassium: 4.3 mEq/L (ref 3.5–5.1)

## 2012-05-02 LAB — TROPONIN I: Troponin I: <0.3 ng/mL (ref <0.30)

## 2012-05-02 MED ORDER — AMIODARONE HCL IN DEXTROSE 360-4.14 MG/200ML-% IV SOLN
60.0000 mg/h | INTRAVENOUS | Status: DC
  Administered 2012-05-02: 60 mg/h via INTRAVENOUS
  Filled 2012-05-02: qty 200

## 2012-05-02 MED ORDER — SODIUM CHLORIDE 0.9 % IV BOLUS (SEPSIS)
1000.0000 mL | Freq: Once | INTRAVENOUS | Status: AC
  Administered 2012-05-02: 1000 mL via INTRAVENOUS

## 2012-05-02 MED ORDER — KETOROLAC TROMETHAMINE 30 MG/ML IJ SOLN
30.0000 mg | Freq: Once | INTRAMUSCULAR | Status: AC
  Administered 2012-05-02: 30 mg via INTRAVENOUS
  Filled 2012-05-02: qty 1

## 2012-05-02 MED ORDER — AMIODARONE LOAD VIA INFUSION
150.0000 mg | Freq: Once | INTRAVENOUS | Status: AC
  Administered 2012-05-02: 150 mg via INTRAVENOUS
  Filled 2012-05-02: qty 83.34

## 2012-05-02 MED ORDER — AMIODARONE HCL IN DEXTROSE 360-4.14 MG/200ML-% IV SOLN: 30.0000 mg/h | INTRAVENOUS | Status: DC

## 2012-05-02 NOTE — ED Provider Notes (Signed)
History     CSN: [id number]  Arrival date & time 05/02/12  1404   First MD Initiated Contact with Patient 05/02/12 1427      Chief Complaint  Patient presents with  . Abdominal Pain  . Shortness of Breath    (Consider location/radiation/quality/duration/timing/severity/associated sxs/prior treatment) HPI Comments: Patient arrived to the ED via EMS with complaints of left lower abdominal pain and a headache. She is a poor historian and does not communicate effectively. She complains of left lower abdominal pain that is severe and she cannot describe it. She denies diarrhea, vomiting. She also complains of a headache that has been constant for 2 days. She also states that her "defibrillator is going off." She says her family will be coming to the hospital but I have not seen them. The nurse called the patient's home phone number, where she lives with her son. I spoke with him on the phone who describes that his mother has been having "seizures" where she has a blank stare on her face for a few seconds. She has been having them more often than usual, up to 3 times per day. She takes Keppra. He also says she woke up this morning complaining of shortness of breath, which is when he called the ambulance to pick her up. He reports taking her to her primary care doctor yesterday and telling him about "panic attacks" his mother has been having and emotional outbursts. The doctor recommended a psych evaluation. The son has not made an appointment and is waiting to hear back from the doctor's office.    Past Medical History  Diagnosis Date  . Seizures   . Hypertension   . Stroke     2007 - R sided weakness  . Asthma   . Diabetes mellitus   . 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 Thyroid disease   . LV dysfunction     Per brother, EF 17% s/p AICD 2007 ([employer]). No hx CAD.    Past Surgical History  Procedure Date  . Cholecystectomy   . Appendectomy   . Pacemaker insertion      Family History  Problem Relation Age of Onset  . Coronary artery disease Neg Hx     History  Substance Use Topics  . Smoking status: Former Games developer  . Smokeless tobacco: Not on file  . Alcohol Use: No    OB History    Grav Para Term Preterm Abortions TAB SAB Ect Mult Living                  Review of Systems  Unable to perform ROS   Allergies  Penicillins and Sulfa antibiotics  Home Medications   Current Outpatient Rx  Name Route Sig Dispense Refill  . ACETAMINOPHEN ER 650 MG PO TBCR Oral Take 650 mg by mouth every 8 (eight) hours as needed. For pain    . ALBUTEROL SULFATE (2.5 MG/3ML) 0.083% IN NEBU Nebulization Take 2.5 mg by nebulization every 6 (six) hours as needed. For cough or wheeze    . ALPRAZOLAM 0.25 MG PO TABS Oral Take 0.25 mg by mouth at bedtime as needed. For sleep    . BUSPIRONE HCL 10 MG PO TABS Oral Take 1 tablet (10 mg total) by mouth every morning. 30 tablet 0  . FUROSEMIDE 40 MG PO TABS Oral Take 1 tablet (40 mg total) by mouth daily. 30 tablet 1  . GLIPIZIDE ER 10 MG PO TB24 Oral  Take 10 mg by mouth 2 (two) times daily.    Marland Kitchen LEVETIRACETAM 500 MG PO TABS Oral Take 500 mg by mouth 4 (four) times daily.     Marland Kitchen LEVOTHYROXINE SODIUM 75 MCG PO TABS Oral Take 75 mcg by mouth daily.    Marland Kitchen LISINOPRIL 5 MG PO TABS Oral Take 5 mg by mouth daily.    Marland Kitchen METOPROLOL TARTRATE 25 MG PO TABS Oral Take 25 mg by mouth 2 (two) times daily.    Marland Kitchen OMEPRAZOLE 20 MG PO CPDR Oral Take 20 mg by mouth 2 (two) times daily.    . OXYCODONE-ACETAMINOPHEN 10-325 MG PO TABS Oral Take 1 tablet by mouth 3 (three) times daily as needed for pain. For pain 15 tablet 0    BP 105/75  Pulse 88  Temp 97.8 F (36.6 C) (Oral)  Resp 15  SpO2 98%  Physical Exam  Constitutional: She appears well-developed and well-nourished. No distress.       Patient is anxious and becomes intermittently upset throughout exam.  HENT:  Head: Normocephalic and atraumatic.  Mouth/Throat: No oropharyngeal  exudate.  Eyes: Conjunctivae are normal. Pupils are equal, round, and reactive to light. No scleral icterus.  Neck: Normal range of motion.  Cardiovascular: Normal rate and regular rhythm.  Exam reveals no gallop and no friction rub.   No murmur heard. Pulmonary/Chest: Effort normal. No respiratory distress. She has no wheezes. She has no rales.  Abdominal: Soft. There is tenderness. There is guarding. There is no rebound.       RLQ and RUQ tender to palpation.  Musculoskeletal: Normal range of motion.  Neurological: She is alert.  Skin: Skin is warm and dry. She is not diaphoretic.  Psychiatric:       The patient seems to respond inappropriately to certain questions and exam maneuvers. For example, listening to her heart and lungs, she pushes me away and says "no" and gets upset.     ED Course  Procedures (including critical care time)   Date: 05/02/2012  Rate: 96  Rhythm: atrial fibrillation  QRS Axis: normal  Intervals: QT prolonged  ST/T Wave abnormalities: nonspecific ST/T changes  Conduction Disutrbances:none  Narrative Interpretation: abnormal EKG, unchanged from previous  Old EKG Reviewed: unchanged    Labs Reviewed  CBC WITH DIFFERENTIAL - Abnormal; Notable for the following:    RBC 3.73 (*)     Hemoglobin 10.3 (*)     HCT 31.9 (*)     All other components within normal limits  POCT I-STAT, CHEM 8 - Abnormal; Notable for the following:    Glucose, Bld 109 (*)     Hemoglobin 11.9 (*)     HCT 35.0 (*)     All other components within normal limits   No results found.   No diagnosis found.    MDM  2:49 PM Patient complains of abdominal pain and headache. She does not communicate very effectively. Her son, who she lives with, will be at the hospital soon so I will get a history from him.   4:20 PM I spoke with the patient's son on the phone who says he is running late because of car trouble but he is on his way. Patient signed out to Dr. Deretha Emory.        Emilia Beck, PA-C 05/02/12 1621

## 2012-05-02 NOTE — ED Notes (Signed)
OJ given

## 2012-05-02 NOTE — ED Notes (Signed)
Confirmed with Son that patient has a Architectural technologist".  Representative called

## 2012-05-02 NOTE — ED Notes (Signed)
Social worker Neurosurgeon) notified, stated patient should follow up with her family doctor for placement if she no longer wants to live at home with her son and brother.  Patient is requesting to live in [facility] with a different brother.

## 2012-05-02 NOTE — ED Provider Notes (Signed)
Medical screening examination/treatment/procedure(s) were conducted as a shared visit with non-physician practitioner(s) and myself.  I personally evaluated the patient during the encounter   The patient was turned over to me by the mid-level physician assistant from the day. When I went to see the patient she had a witnessed bout of V. tach about 10 beats. Defibrillator did not fire. Discussed the situation with a  cardiology that was involved with her admission at the beginning of the month. Cardiology felt that definitely readmission was not required.  At the time that the patient had the run of V. tach it was not clear that she had the defibrillator no charges showing pacemaker from previous visit on initial screen. So Denyse Dago on was started as a bolus to cover her. 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 Jude's pacemaker and defibrillator called in the [place] Jude people to care date it is working properly, however would not shown just 10 beats of V. tach. But otherwise it did not fire and that was appropriate. Patient rest workup included a cardiac marker and an INR which showed no significant abnormalities. Patient can be discharged home had admission on August 5 outpatient follow up was arranged she continue with that followup.   Patient is at baseline no significant changes with today's visit.     Shelda Jakes, MD 05/02/12 [phone]

## 2012-05-02 NOTE — ED Notes (Addendum)
Patient reports onset of abd pain last night while lying in bed.  No n/v reported.  No diarrhea.  Patient is also complaining of sob

## 2012-05-02 NOTE — ED Notes (Addendum)
Patient states "family not caring for her, bathing her or feeding her" Child psychotherapist paged for assistance.

## 2012-05-02 NOTE — ED Notes (Signed)
Representative in.

## 2012-05-02 NOTE — ED Notes (Signed)
Discharge instructions reviewed.  Pt verbalized understanding.  Pt condition stable, no respiratory compromise noted.  Skin warm and dry.  Alert and oriented x 4.  No signs of distress. Family present with patient at time of discharge.

## 2012-05-02 NOTE — ED Notes (Signed)
Called patients home and spoke with brother, Dorinda Hill.  Informed Dorinda Hill that patient was ready for discharge and need of transport home.

## 2012-05-06 NOTE — ED Provider Notes (Signed)
Medical screening examination/treatment/procedure(s) were performed by non-physician practitioner and as supervising physician I was immediately available for consultation/collaboration.   Gwyneth Sprout, MD 05/06/12 [phone]

## 2012-07-18 ENCOUNTER — Inpatient Hospital Stay (HOSPITAL_BASED_OUTPATIENT_CLINIC_OR_DEPARTMENT_OTHER)
Admission: EM | Admit: 2012-07-18 | Discharge: 2012-07-22 | DRG: 253 | Disposition: A | Discharge status: Home / Self Care | Payer: Medicare Other | Attending: Internal Medicine | Admitting: Internal Medicine

## 2012-07-18 ENCOUNTER — Inpatient Hospital Stay (HOSPITAL_COMMUNITY): Payer: Medicare Other

## 2012-07-18 ENCOUNTER — Emergency Department (HOSPITAL_BASED_OUTPATIENT_CLINIC_OR_DEPARTMENT_OTHER): Payer: Medicare Other

## 2012-07-18 ENCOUNTER — Encounter (HOSPITAL_BASED_OUTPATIENT_CLINIC_OR_DEPARTMENT_OTHER): Payer: Self-pay | Admitting: *Deleted

## 2012-07-18 DIAGNOSIS — I428 Other cardiomyopathies: Secondary | ICD-10-CM | POA: Diagnosis present

## 2012-07-18 DIAGNOSIS — Z87891 Personal history of nicotine dependence: Secondary | ICD-10-CM

## 2012-07-18 DIAGNOSIS — J45909 Unspecified asthma, uncomplicated: Secondary | ICD-10-CM | POA: Diagnosis present

## 2012-07-18 DIAGNOSIS — I4891 Unspecified atrial fibrillation: Secondary | ICD-10-CM | POA: Diagnosis present

## 2012-07-18 DIAGNOSIS — I69920 Aphasia following unspecified cerebrovascular disease: Secondary | ICD-10-CM

## 2012-07-18 DIAGNOSIS — I5022 Chronic systolic (congestive) heart failure: Secondary | ICD-10-CM | POA: Diagnosis present

## 2012-07-18 DIAGNOSIS — M79604 Pain in right leg: Secondary | ICD-10-CM | POA: Diagnosis present

## 2012-07-18 DIAGNOSIS — R29898 Other symptoms and signs involving the musculoskeletal system: Secondary | ICD-10-CM | POA: Diagnosis present

## 2012-07-18 DIAGNOSIS — E872 Acidosis, unspecified: Secondary | ICD-10-CM | POA: Diagnosis present

## 2012-07-18 DIAGNOSIS — G40909 Epilepsy, unspecified, not intractable, without status epilepticus: Secondary | ICD-10-CM | POA: Diagnosis present

## 2012-07-18 DIAGNOSIS — I10 Essential (primary) hypertension: Secondary | ICD-10-CM | POA: Diagnosis present

## 2012-07-18 DIAGNOSIS — M7989 Other specified soft tissue disorders: Secondary | ICD-10-CM

## 2012-07-18 DIAGNOSIS — D696 Thrombocytopenia, unspecified: Secondary | ICD-10-CM

## 2012-07-18 DIAGNOSIS — E119 Type 2 diabetes mellitus without complications: Secondary | ICD-10-CM | POA: Diagnosis present

## 2012-07-18 DIAGNOSIS — R791 Abnormal coagulation profile: Secondary | ICD-10-CM

## 2012-07-18 DIAGNOSIS — M79609 Pain in unspecified limb: Secondary | ICD-10-CM

## 2012-07-18 DIAGNOSIS — D689 Coagulation defect, unspecified: Secondary | ICD-10-CM | POA: Diagnosis present

## 2012-07-18 DIAGNOSIS — Z95810 Presence of automatic (implantable) cardiac defibrillator: Secondary | ICD-10-CM

## 2012-07-18 DIAGNOSIS — R4182 Altered mental status, unspecified: Secondary | ICD-10-CM

## 2012-07-18 DIAGNOSIS — R109 Unspecified abdominal pain: Secondary | ICD-10-CM

## 2012-07-18 DIAGNOSIS — I429 Cardiomyopathy, unspecified: Secondary | ICD-10-CM

## 2012-07-18 DIAGNOSIS — I69998 Other sequelae following unspecified cerebrovascular disease: Secondary | ICD-10-CM

## 2012-07-18 DIAGNOSIS — I82409 Acute embolism and thrombosis of unspecified deep veins of unspecified lower extremity: Secondary | ICD-10-CM

## 2012-07-18 DIAGNOSIS — R7989 Other specified abnormal findings of blood chemistry: Secondary | ICD-10-CM | POA: Diagnosis present

## 2012-07-18 DIAGNOSIS — I69320 Aphasia following cerebral infarction: Secondary | ICD-10-CM

## 2012-07-18 DIAGNOSIS — F1110 Opioid abuse, uncomplicated: Secondary | ICD-10-CM

## 2012-07-18 DIAGNOSIS — I824Y9 Acute embolism and thrombosis of unspecified deep veins of unspecified proximal lower extremity: Principal | ICD-10-CM | POA: Diagnosis present

## 2012-07-18 DIAGNOSIS — I509 Heart failure, unspecified: Secondary | ICD-10-CM | POA: Diagnosis present

## 2012-07-18 DIAGNOSIS — Z79899 Other long term (current) drug therapy: Secondary | ICD-10-CM

## 2012-07-18 DIAGNOSIS — E039 Hypothyroidism, unspecified: Secondary | ICD-10-CM | POA: Diagnosis present

## 2012-07-18 LAB — D-DIMER, QUANTITATIVE: D-Dimer, Quant: 2.97 ug/mL-FEU — ABNORMAL HIGH (ref 0.00–0.48)

## 2012-07-18 LAB — BASIC METABOLIC PANEL
Calcium: 9.2 mg/dL (ref 8.4–10.5)
GFR calc Af Amer: 83 mL/min — ABNORMAL LOW (ref >90)
GFR calc non Af Amer: 71 mL/min — ABNORMAL LOW (ref >90)
Glucose, Bld: 126 mg/dL — ABNORMAL HIGH (ref 70–99)
Potassium: 3.5 mEq/L (ref 3.5–5.1)
Sodium: 138 mEq/L (ref 135–145)

## 2012-07-18 LAB — CBC WITH DIFFERENTIAL/PLATELET
Basophils Absolute: 0 10*3/uL (ref 0.0–0.1)
Eosinophils Absolute: 0.1 10*3/uL (ref 0.0–0.7)
HCT: 29.1 % — ABNORMAL LOW (ref 36.0–46.0)
Lymphocytes Relative: 21 % (ref 12–46)
MCHC: 33 g/dL (ref 30.0–36.0)
Monocytes Relative: 8 % (ref 3–12)
Neutrophils Relative %: 70 % (ref 43–77)
Platelets: 179 10*3/uL (ref 150–400)
RDW: 21 % — ABNORMAL HIGH (ref 11.5–15.5)
WBC: 5.6 10*3/uL (ref 4.0–10.5)

## 2012-07-18 LAB — DIC (DISSEMINATED INTRAVASCULAR COAGULATION)PANEL
Fibrinogen: 159 mg/dL — ABNORMAL LOW (ref 204–475)
INR: 2.44 — ABNORMAL HIGH (ref 0.00–1.49)
Smear Review: NONE SEEN
aPTT: 44 seconds — ABNORMAL HIGH (ref 24–37)

## 2012-07-18 LAB — HEPATIC FUNCTION PANEL
ALT: 13 U/L (ref 0–35)
Alkaline Phosphatase: 71 U/L (ref 39–117)
Indirect Bilirubin: 0.5 mg/dL (ref 0.3–0.9)
Total Bilirubin: 1.2 mg/dL (ref 0.3–1.2)
Total Protein: 7.4 g/dL (ref 6.0–8.3)

## 2012-07-18 LAB — GLUCOSE, CAPILLARY: Glucose-Capillary: 80 mg/dL (ref 70–99)

## 2012-07-18 LAB — GAMMA GT: GGT: 195 U/L — ABNORMAL HIGH (ref 7–51)

## 2012-07-18 LAB — HEMOGLOBIN A1C: Hgb A1c MFr Bld: 7.1 % — ABNORMAL HIGH (ref <5.7)

## 2012-07-18 LAB — PROTIME-INR: INR: 2.1 — ABNORMAL HIGH (ref 0.00–1.49)

## 2012-07-18 MED ORDER — SODIUM CHLORIDE 0.9 % IJ SOLN
3.0000 mL | INTRAMUSCULAR | Status: DC | PRN
  Administered 2012-07-20: 3 mL via INTRAVENOUS

## 2012-07-18 MED ORDER — ALPRAZOLAM 0.25 MG PO TABS
0.2500 mg | ORAL_TABLET | Freq: Every evening | ORAL | Status: DC | PRN
  Administered 2012-07-19: 0.25 mg via ORAL
  Filled 2012-07-18 (×2): qty 1

## 2012-07-18 MED ORDER — ONDANSETRON HCL 4 MG/2ML IJ SOLN: 4.0000 mg | Freq: Four times a day (QID) | INTRAMUSCULAR | Status: DC | PRN

## 2012-07-18 MED ORDER — LEVOTHYROXINE SODIUM 75 MCG PO TABS
75.0000 ug | ORAL_TABLET | Freq: Every day | ORAL | Status: DC
  Administered 2012-07-19: 75 ug via ORAL
  Filled 2012-07-18 (×2): qty 1

## 2012-07-18 MED ORDER — TECHNETIUM TO 99M ALBUMIN AGGREGATED: 3.0000 | Freq: Once | INTRAVENOUS | Status: AC | PRN

## 2012-07-18 MED ORDER — ACETAMINOPHEN 500 MG PO TABS
1000.0000 mg | ORAL_TABLET | Freq: Once | ORAL | Status: DC
  Filled 2012-07-18: qty 2

## 2012-07-18 MED ORDER — SODIUM CHLORIDE 0.9 % IJ SOLN
3.0000 mL | Freq: Two times a day (BID) | INTRAMUSCULAR | Status: DC
  Administered 2012-07-18 – 2012-07-22 (×5): 3 mL via INTRAVENOUS

## 2012-07-18 MED ORDER — LEVETIRACETAM 500 MG PO TABS
500.0000 mg | ORAL_TABLET | Freq: Four times a day (QID) | ORAL | Status: DC
  Administered 2012-07-18 – 2012-07-19 (×4): 500 mg via ORAL
  Filled 2012-07-18 (×8): qty 1

## 2012-07-18 MED ORDER — SODIUM CHLORIDE 0.9 % IV SOLN
INTRAVENOUS | Status: DC
  Administered 2012-07-18: 50 mL/h via INTRAVENOUS
  Administered 2012-07-19: 01:00:00 via INTRAVENOUS

## 2012-07-18 MED ORDER — METOPROLOL TARTRATE 1 MG/ML IV SOLN
2.5000 mg | Freq: Once | INTRAVENOUS | Status: AC
  Administered 2012-07-18: 2.5 mg via INTRAVENOUS
  Filled 2012-07-18: qty 5

## 2012-07-18 MED ORDER — PANTOPRAZOLE SODIUM 40 MG PO TBEC
40.0000 mg | DELAYED_RELEASE_TABLET | Freq: Every day | ORAL | Status: DC
  Administered 2012-07-19 – 2012-07-22 (×4): 40 mg via ORAL
  Filled 2012-07-18 (×5): qty 1

## 2012-07-18 MED ORDER — METOPROLOL TARTRATE 12.5 MG HALF TABLET
12.5000 mg | ORAL_TABLET | Freq: Two times a day (BID) | ORAL | Status: DC
  Administered 2012-07-19 – 2012-07-20 (×2): 12.5 mg via ORAL
  Filled 2012-07-18 (×6): qty 1

## 2012-07-18 MED ORDER — LORAZEPAM 2 MG/ML IJ SOLN
0.5000 mg | Freq: Three times a day (TID) | INTRAMUSCULAR | Status: DC | PRN
  Administered 2012-07-18 – 2012-07-20 (×2): 0.5 mg via INTRAVENOUS
  Filled 2012-07-18 (×2): qty 1

## 2012-07-18 MED ORDER — ENOXAPARIN SODIUM 60 MG/0.6ML ~~LOC~~ SOLN
60.0000 mg | Freq: Two times a day (BID) | SUBCUTANEOUS | Status: DC
  Administered 2012-07-18 – 2012-07-22 (×8): 60 mg via SUBCUTANEOUS
  Filled 2012-07-18 (×12): qty 0.6

## 2012-07-18 MED ORDER — ACETAMINOPHEN 325 MG PO TABS
650.0000 mg | ORAL_TABLET | Freq: Four times a day (QID) | ORAL | Status: DC | PRN
  Administered 2012-07-21: 650 mg via ORAL
  Filled 2012-07-18: qty 2

## 2012-07-18 MED ORDER — ACETAMINOPHEN 650 MG RE SUPP: 650.0000 mg | Freq: Four times a day (QID) | RECTAL | Status: DC | PRN

## 2012-07-18 MED ORDER — INSULIN ASPART 100 UNIT/ML ~~LOC~~ SOLN
0.0000 [IU] | Freq: Three times a day (TID) | SUBCUTANEOUS | Status: DC
  Administered 2012-07-19 – 2012-07-20 (×3): 2 [IU] via SUBCUTANEOUS

## 2012-07-18 MED ORDER — OXYCODONE HCL 5 MG PO TABS
5.0000 mg | ORAL_TABLET | ORAL | Status: DC | PRN
  Administered 2012-07-18 – 2012-07-21 (×5): 5 mg via ORAL
  Filled 2012-07-18 (×8): qty 1

## 2012-07-18 MED ORDER — HEPARIN (PORCINE) IN NACL 100-0.45 UNIT/ML-% IJ SOLN
700.0000 [IU]/h | INTRAMUSCULAR | Status: DC
  Administered 2012-07-18: 700 [IU]/h via INTRAVENOUS
  Filled 2012-07-18: qty 250

## 2012-07-18 MED ORDER — ONDANSETRON HCL 4 MG PO TABS: 4.0000 mg | ORAL_TABLET | Freq: Four times a day (QID) | ORAL | Status: DC | PRN

## 2012-07-18 MED ORDER — SODIUM CHLORIDE 0.9 % IV SOLN: 250.0000 mL | INTRAVENOUS | Status: DC | PRN

## 2012-07-18 MED ORDER — SODIUM CHLORIDE 0.9 % IJ SOLN
3.0000 mL | Freq: Two times a day (BID) | INTRAMUSCULAR | Status: DC
  Administered 2012-07-18 – 2012-07-21 (×8): 3 mL via INTRAVENOUS

## 2012-07-18 MED ORDER — HEPARIN (PORCINE) IN NACL 100-0.45 UNIT/ML-% IJ SOLN
10.0000 [IU]/kg/h | Freq: Once | INTRAMUSCULAR | Status: AC
  Administered 2012-07-18: 10 [IU]/kg/h via INTRAVENOUS
  Filled 2012-07-18: qty 250

## 2012-07-18 MED ORDER — BUSPIRONE HCL 10 MG PO TABS
10.0000 mg | ORAL_TABLET | Freq: Every morning | ORAL | Status: DC
  Filled 2012-07-18: qty 1

## 2012-07-18 MED ORDER — ALBUTEROL SULFATE (5 MG/ML) 0.5% IN NEBU: 2.5000 mg | INHALATION_SOLUTION | RESPIRATORY_TRACT | Status: DC | PRN

## 2012-07-18 NOTE — ED Notes (Signed)
MD attempt IJ to right without success.

## 2012-07-18 NOTE — ED Notes (Signed)
Pt c/o right ankle swelling x 3 hours, unknown injury. Pt was seen at [facility] on Wednesday for chest pain and d/c'd home.

## 2012-07-18 NOTE — Progress Notes (Signed)
Notified Dr. Cena Benton that IV team cannot place picc on pt due to R. Arm flaccid and L. Side having a pacer.  Dr. Cena Benton gave order for VQ scan to be done.  Will carry out MD orders and continue to monitor.

## 2012-07-18 NOTE — H&P (Signed)
[facility] History and Physical  Elizabeth Love ZOX:[id number] DOB: 08-31-1956 DOA: 07/18/2012  Referring physician: Dr. Terressa Koyanagi PCP: No primary provider on file.  Specialists: none  Chief Complaint: RLE pain  HPI: Elizabeth Love is a 56 y.o. 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  Most of information is obtained from EMR, [facility] and brother. Patient has h/o atrial fibrillation (with no records that patient is on coumadin at home and patient is unsure), Chronic systolic HF with EF per brother of 17% s/p AICD 2007.  Presents with 2 day complaint of RLE pain.  Per brother patient has not had any recent prolonged travels.  Has presented multiple times to ed for evaluation.  Denies any trauma or recent falls. She has had a stroke which affected her right side and as such she has weakness in her Right extremities.  Pain is worse with movement.  In ED was found to have elevated D dimer and placed on heparin gtt.  We were contacted for further evaluation and admission orders.  Review of Systems: Unable to properly assess due to aphasia.  Past Medical History  Diagnosis Date  . Seizures   . Hypertension   . Stroke     2007 - R sided weakness  . Asthma   . Diabetes mellitus   . 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 Thyroid disease   . LV dysfunction     Per brother, EF 17% s/p AICD 2007 ([employer]). No hx CAD.   Past Surgical History  Procedure Date  . Cholecystectomy   . Appendectomy   . Pacemaker insertion    Social History:  reports that she has quit smoking. She does not have any smokeless tobacco history on file. She reports that she does not drink alcohol or use illicit drugs. At home with son Participates with adls  Allergies  Allergen Reactions  . Penicillins Rash  . Sulfa Antibiotics Rash    Family History  Problem Relation Age of Onset  . Coronary artery disease Neg Hx   none other  reported  Prior to Admission medications   Medication Sig Start Date End Date Taking? Authorizing Provider  albuterol (PROVENTIL) (2.5 MG/3ML) 0.083% nebulizer solution Take 2.5 mg by nebulization every 6 (six) hours as needed. For cough or wheeze   Yes Historical Provider, MD  ALPRAZolam (XANAX) 0.25 MG tablet Take 0.25 mg by mouth at bedtime as needed. For sleep   Yes Historical Provider, MD  furosemide (LASIX) 40 MG tablet Take 1 tablet (40 mg total) by mouth daily. 04/25/12 04/25/13 Yes Jessica U Vann, DO  glipiZIDE (GLUCOTROL XL) 10 MG 24 hr tablet Take 10 mg by mouth 2 (two) times daily.   Yes Historical Provider, MD  levETIRAcetam (KEPPRA) 500 MG tablet Take 500 mg by mouth 4 (four) times daily.    Yes Historical Provider, MD  levothyroxine (SYNTHROID, LEVOTHROID) 75 MCG tablet Take 75 mcg by mouth daily.   Yes Historical Provider, MD  lisinopril (PRINIVIL,ZESTRIL) 5 MG tablet Take 5 mg by mouth daily.   Yes Historical Provider, MD  metoprolol tartrate (LOPRESSOR) 25 MG tablet Take 25 mg by mouth 2 (two) times daily.   Yes Historical Provider, MD  omeprazole (PRILOSEC) 20 MG capsule Take 20 mg by mouth 2 (two) times daily.   Yes Historical Provider, MD  oxyCODONE-acetaminophen (PERCOCET) 10-325 MG per tablet Take 1 tablet by mouth 3 (three) times daily  as needed for pain. For pain 04/25/12  Yes Joseph Art, DO  acetaminophen (TYLENOL) 650 MG CR tablet Take 650 mg by mouth every 8 (eight) hours as needed. For pain    Historical Provider, MD  busPIRone (BUSPAR) 10 MG tablet Take 1 tablet (10 mg total) by mouth every morning. 04/25/12 04/25/13  Joseph Art, DO   Physical Exam: Filed Vitals:   07/18/12 0410 07/18/12 0524 07/18/12 0554 07/18/12 0715  BP: 97/63 92/63  101/75  Pulse: 81 84  83  Temp: 98 F (36.7 C) 97.7 F (36.5 C)  97.9 F (36.6 C)  TempSrc: Oral Oral  Oral  Resp: 20 18 18 18   Height:    5\' 2"  (1.575 m)  Weight:    57.9 kg (127 lb 10.3 oz)  SpO2: 99% 100%  100%      General:  Pt in NAD, Alert and Awake  Eyes: EOMI, non icteric  ENT: no masses on visual inspection, normal exterior appearance  Neck: supple, no goiter  Cardiovascular: S1 and S2, no murmurs  Respiratory: CTA BL, no wheezes or increased breathing  Abdomen: soft, NT  Skin: warm, dry  Musculoskeletal: no cyanosis or clubbing  Psychiatric: unable to properly assess due to h/o CVA and aphasia  Neurologic: Right hemiplegia, gazes and responds to examiner with limited responses and difficulty obtaining words  Labs on Admission:  Basic Metabolic Panel:  Lab 07/18/12 5784  NA 138  K 3.5  CL 103  CO2 19  GLUCOSE 126*  BUN 15  CREATININE 0.90  CALCIUM 9.2  MG --  PHOS --   Liver Function Tests:  Lab 07/18/12 0240  AST 33  ALT 13  ALKPHOS 71  BILITOT 1.2  PROT 7.4  ALBUMIN 3.4*   No results found for this basename: LIPASE:5,AMYLASE:5 in the last 168 hours No results found for this basename: AMMONIA:5 in the last 168 hours CBC:  Lab 07/18/12 0237  WBC 5.6  NEUTROABS 3.9  HGB 9.6*  HCT 29.1*  MCV 74.4*  PLT 179   Cardiac Enzymes:  Lab 07/18/12 0240  CKTOTAL --  CKMB --  CKMBINDEX --  TROPONINI <0.30    BNP (last 3 results)  Basename 07/18/12 0240 04/23/12 0232  PROBNP 6251.0* 3629.0*   CBG: No results found for this basename: GLUCAP:5 in the last 168 hours  Radiological Exams on Admission: Dg Chest 1 View  07/18/2012  *RADIOLOGY REPORT*  Clinical Data: Right lower extremity pain and swelling.  CHEST - 1 VIEW  Comparison: Chest 05/02/2012 and CT chest 04/23/2012.  Findings: AICD is in place.  There is massive cardiomegaly and mild interstitial edema.  Left basilar atelectasis is noted.  No pneumothorax.  IMPRESSION: Massive cardiomegaly and mild interstitial edema.   Original Report Authenticated By: Bernadene Bell. D'ALESSIO, M.D.    Dg Tibia/fibula Right  07/18/2012  *RADIOLOGY REPORT*  Clinical Data: Swelling and pain.  No known injury.  RIGHT  TIBIA AND FIBULA - 2 VIEW  Comparison: None.  Findings: There is diffuse soft tissue swelling.  No soft tissue gas collection or radiopaque foreign body is identified.  There is no fracture or dislocation.  No bony destructive change is noted. Bones appear osteopenic.  IMPRESSION:  1.  Diffuse soft tissue swelling without underlying focal bony or joint abnormality. 2.  Osteopenia.   Original Report Authenticated By: Bernadene Bell. D'ALESSIO, M.D.    Dg Ankle Complete Right  07/18/2012  *RADIOLOGY REPORT*  Clinical Data: Diffuse soft tissue swelling.  No known injury.  RIGHT ANKLE - COMPLETE 3+ VIEW  Comparison: None.  Findings: Diffuse and marked soft tissue swelling is identified. No soft tissue gas collection or radiopaque foreign body is identified.  There is no fracture or dislocation.  Bones appear osteopenic.  No bony destructive change.  IMPRESSION:  1.  Diffuse soft tissue swelling without underlying focal bony abnormality. 2.  Osteopenia.   Original Report Authenticated By: Bernadene Bell. D'ALESSIO, M.D.     EKG: Independently reviewed. Not in chart.  Will obtain EKG prn chest discomfort.  Assessment/Plan Active Problems: R leg pain Atrial fibrillation Positive D dimer H/o CVA hypothyroidism  1) Right leg pain - In setting of elevated D dimer.  Will go ahead and order duplex of lower extremities, continue heparin gtt, also obtain CT angiogram to evaluate for PE.  Heparin per pharmacy consult.  Pending results we will make further decisions. - Monitor in telemetry - Pain control  2. Positive D dimer - As mentioned above will evaluate for clots - heparin gtt per pharmacy  3. Atrial fibrillation - place on decreased dose of B blocker for continued rate control at this juncture  4. CHF - continue b blocker - hold lisinopril and lasix given lower normal blood pressures.  Given that patient will require pain medication which may decrease blood pressure would want to hold other blood pressure  medication at this juncture.  Plan on continuing with improved blood pressure readings.  5. Hypothyroidism - continue home regimen and monitor.  Code Status: Full Family Communication: Spoke with brother Disposition Plan: Pending results of test and further evaluation  Time spent: > 65 minutes  Penny Pia [facility] Pager [phone]  If 7PM-7AM, please contact night-coverage [email or web address] Password [email or web address] 07/18/2012, 8:12 AM

## 2012-07-18 NOTE — ED Notes (Signed)
Pt. Refused to take Tylenol. MD aware. Pt. Has been stuck times 3 without success. MD also aware.

## 2012-07-18 NOTE — Progress Notes (Signed)
Pts cbg was 59 dinner.  Pt given orange juice and ginger ale.  Rechecked pt's cbg and it was 104.  Will continue to monitor pt.

## 2012-07-18 NOTE — ED Notes (Signed)
Report called to [place], RN on [phone]

## 2012-07-18 NOTE — ED Notes (Signed)
Returned from xray

## 2012-07-18 NOTE — ED Notes (Signed)
Report to [place] with [facility]

## 2012-07-18 NOTE — Progress Notes (Signed)
Pt refused all AM medications.  Dr. Cena Benton notified.

## 2012-07-18 NOTE — Progress Notes (Signed)
Pharmacy Consult-Anticoagulation  Pharmacy Consult:    Elizabeth Love is a 56 y/o female with chronically elevated INR's while on no anticoagulation now is currently on heparin protocol for VTE treatment.   She is currently uncooperative and is refusing all labs.  As a result, there is no way to monitor the effects of Heparin without laboratory results.  Per discussion with Dr.Vega, we will discontinue Heparin infusion and begin Lovenox-full dose for VTE treatment.  Current Labs: Hematology  [facility] 07/18/12 0237  HGB 9.6*  HCT 29.1*  PLT 179  APTT --  LABPROT 22.7*  INR 2.10*  CREATININE 0.90   Lab Results  Component Value Date   INR 2.10* 07/18/2012   INR 1.78* 05/02/2012   INR 1.74* 04/24/2012    Estimated Creatinine Clearance: 56.5 ml/min (by C-G formula based on Cr of 0.9).  Current Meds:   acetaminophen 1,000 mg Oral Once  enoxaparin 60 mg Subcutaneous Q12H  insulin aspart 0-9 Units Subcutaneous TID WC  levETIRAcetam 500 mg Oral QID  levothyroxine 75 mcg Oral Daily  metoprolol tartrate 12.5 mg Oral BID  pantoprazole 40 mg Oral Daily   Assessment:  Patient is currently uncooperative and refusing labs.  Unable to assess Heparin therapy at this time.  No bleeding complications observed.  Goal/Plan:  Heparin will be discontinued and Lovenox at therapeutic doses started in its place.    CrCl 56.5 ml/min.  Lovenox does not require renal adjustments.  Begin Lovenox 1 hour after Heparin discontinued.  Paulita Licklider, Elisha Headland, Pharm.D. 07/18/2012  5:54 PM

## 2012-07-18 NOTE — ED Notes (Signed)
[facility] here to transport pt. To [facility]. Heparin drip continued at 700 units/hr or 20ml/hr.

## 2012-07-18 NOTE — ED Provider Notes (Signed)
History     CSN: [id number]  Arrival date & time 07/18/12  0150   First MD Initiated Contact with Patient 07/18/12 0211      Chief Complaint  Patient presents with  . Joint Swelling    (Consider location/radiation/quality/duration/timing/severity/associated sxs/prior treatment) Patient is a 56 y.o. female presenting with leg pain. The history is provided by the patient and a relative.  Leg Pain  The incident occurred yesterday. The incident occurred at home. There was no injury mechanism. Pain location: right ankle and tib fib. The quality of the pain is described as aching. The pain is severe. The pain has been constant since onset. Pertinent negatives include no numbness, no inability to bear weight, no loss of motion, no muscle weakness, no loss of sensation and no tingling. She reports no foreign bodies present. Nothing aggravates the symptoms. She has tried nothing for the symptoms. The treatment provided no relief.  Brother states she had pain without swelling and was xrayed at [facility] 2 days ago.  Was seen for abdominal pain though patient reports she had chest pain at that time.  No current chest pain   Past Medical History  Diagnosis Date  . Seizures   . Hypertension   . Stroke     2007 - R sided weakness  . Asthma   . Diabetes mellitus   . 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 Thyroid disease   . LV dysfunction     Per brother, EF 17% s/p AICD 2007 ([employer]). No hx CAD.    Past Surgical History  Procedure Date  . Cholecystectomy   . Appendectomy   . Pacemaker insertion     Family History  Problem Relation Age of Onset  . Coronary artery disease Neg Hx     History  Substance Use Topics  . Smoking status: Former Games developer  . Smokeless tobacco: Not on file  . Alcohol Use: No    OB History    Grav Para Term Preterm Abortions TAB SAB Ect Mult Living                  Review of Systems  Cardiovascular: Positive for leg swelling. Negative for chest  pain.  Neurological: Negative for tingling and numbness.  All other systems reviewed and are negative.    Allergies  Penicillins and Sulfa antibiotics  Home Medications   Current Outpatient Rx  Name Route Sig Dispense Refill  . ALBUTEROL SULFATE (2.5 MG/3ML) 0.083% IN NEBU Nebulization Take 2.5 mg by nebulization every 6 (six) hours as needed. For cough or wheeze    . ALPRAZOLAM 0.25 MG PO TABS Oral Take 0.25 mg by mouth at bedtime as needed. For sleep    . FUROSEMIDE 40 MG PO TABS Oral Take 1 tablet (40 mg total) by mouth daily. 30 tablet 1  . GLIPIZIDE ER 10 MG PO TB24 Oral Take 10 mg by mouth 2 (two) times daily.    Marland Kitchen LEVETIRACETAM 500 MG PO TABS Oral Take 500 mg by mouth 4 (four) times daily.     Marland Kitchen LEVOTHYROXINE SODIUM 75 MCG PO TABS Oral Take 75 mcg by mouth daily.    Marland Kitchen LISINOPRIL 5 MG PO TABS Oral Take 5 mg by mouth daily.    Marland Kitchen METOPROLOL TARTRATE 25 MG PO TABS Oral Take 25 mg by mouth 2 (two) times daily.    Marland Kitchen OMEPRAZOLE 20 MG PO CPDR Oral Take 20 mg by mouth 2 (two) times daily.    Marland Kitchen  OXYCODONE-ACETAMINOPHEN 10-325 MG PO TABS Oral Take 1 tablet by mouth 3 (three) times daily as needed for pain. For pain 15 tablet 0  . ACETAMINOPHEN ER 650 MG PO TBCR Oral Take 650 mg by mouth every 8 (eight) hours as needed. For pain    . BUSPIRONE HCL 10 MG PO TABS Oral Take 1 tablet (10 mg total) by mouth every morning. 30 tablet 0    BP 95/70  Pulse 97  Temp 97.7 F (36.5 C) (Oral)  Resp 18  Ht 5\' 2"  (1.575 m)  Wt 159 lb (72.122 kg)  BMI 29.08 kg/m2  SpO2 98%  Physical Exam  Constitutional: She appears well-developed and well-nourished.  HENT:  Head: Normocephalic and atraumatic.  Mouth/Throat: Oropharynx is clear and moist.  Eyes: Conjunctivae normal are normal. Pupils are equal, round, and reactive to light.  Neck: Normal range of motion. Neck supple.  Cardiovascular: Normal rate and intact distal pulses.  An irregular rhythm present.  Pulmonary/Chest: Effort normal. She has  rales.  Abdominal: Soft. Bowel sounds are normal. There is no tenderness. There is no rebound and no guarding.  Musculoskeletal: She exhibits edema and tenderness.       Right foot and calf  Lymphadenopathy:    She has no cervical adenopathy.  Neurological: She is alert. She has normal reflexes.  Skin: Skin is warm and dry.  Psychiatric: She has a normal mood and affect.    ED Course  Procedures (including critical care time)   Labs Reviewed  CBC WITH DIFFERENTIAL  BASIC METABOLIC PANEL  PROTIME-INR  D-DIMER, QUANTITATIVE   No results found.   No diagnosis found.    MDM   Date: 07/18/2012  Rate: 95  Rhythm: atrial fibrillation  QRS Axis: normal  Intervals: QT prolonged  ST/T Wave abnormalities: nonspecific ST changes  Conduction Disutrbances:none  Narrative Interpretation:   Old EKG Reviewed: none available     Leg pain and swelling with elevated ddimer and CHF will need admission for DVT PE rule out and treatment     Hawken Bielby K Yesenia Locurto-Rasch, MD 07/18/12 8295

## 2012-07-18 NOTE — ED Notes (Signed)
Transported to xray 

## 2012-07-18 NOTE — Progress Notes (Signed)
Pts HR went up to 130's, now 124.  Pt asymptomatic, bp 92/66.  Dr. Cena Benton notified.

## 2012-07-18 NOTE — Progress Notes (Signed)
Pt refused VQ scan per vascular.  Dr. Cena Benton notified.  Will continue to monitor.

## 2012-07-18 NOTE — Progress Notes (Signed)
VASCULAR LAB PRELIMINARY  PRELIMINARY  PRELIMINARY  PRELIMINARY  Bilateral lower extremity venous duplex attempted.  Preliminary report:  Technically impossible due to patient cooperation. Patient was screaming and crying with constant movement even though she was not being touched. At what limited time the exam was in progress it was positive for an acute DVT of the right popliteal vein and possibly the right posterior tibial vein. The veins of the thigh were negative. Patient became quite agitated and would not allow me to complete the right lower leg or the left lower extremity. Patient was sent back to the room at that time.  Gabrian Hoque, RVS 07/18/2012, 10:31 AM

## 2012-07-18 NOTE — Progress Notes (Addendum)
Patient accepted for transfer from [facility]: 56 yo F with leg swelling, likely needs CT angio chest to r/o PE and venous duplex of lower extremity, put on heparin gtt, has INR of 2.1 but is not on coumadin nor any other anticoagulation.  Poor venous access, likely needs PICC line to get CTA.  Patient still in transport not yet arrived at time of this note.

## 2012-07-18 NOTE — Progress Notes (Signed)
ANTICOAGULATION CONSULT NOTE - Initial Consult  Pharmacy Consult for heparin Indication: VTE treatment  Allergies  Allergen Reactions  . Penicillins Rash  . Sulfa Antibiotics Rash    Patient Measurements: Height: 5\' 2"  (157.5 cm) Weight: 127 lb 10.3 oz (57.9 kg) (Scale C) IBW/kg (Calculated) : 50.1  Heparin Dosing Weight: 57.9 kg  Vital Signs: Temp: 97.9 F (36.6 C) (10/31 0715) Temp src: Oral (10/31 0715) BP: 101/75 mmHg (10/31 0715) Pulse Rate: 83  (10/31 0715)  Labs:  Basename 07/18/12 0240 07/18/12 0237  HGB -- 9.6*  HCT -- 29.1*  PLT -- 179  APTT -- --  LABPROT -- 22.7*  INR -- 2.10*  HEPARINUNFRC -- --  CREATININE -- 0.90  CKTOTAL -- --  CKMB -- --  TROPONINI <0.30 --    Estimated Creatinine Clearance: 56.5 ml/min (by C-G formula based on Cr of 0.9).   Medical History: Past Medical History  Diagnosis Date  . Seizures   . Hypertension   . Stroke     2007 - R sided weakness  . Asthma   . Diabetes mellitus   . 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 Thyroid disease   . LV dysfunction     Per brother, EF 17% s/p AICD 2007 ([employer]). No hx CAD.    Medications:  Prescriptions prior to admission  Medication Sig Dispense Refill  . acetaminophen (TYLENOL) 650 MG CR tablet Take 650 mg by mouth every 8 (eight) hours as needed. For pain      . albuterol (PROVENTIL) (2.5 MG/3ML) 0.083% nebulizer solution Take 2.5 mg by nebulization every 6 (six) hours as needed. For cough or wheeze      . ALPRAZolam (XANAX) 0.25 MG tablet Take 0.25 mg by mouth at bedtime as needed. For sleep      . furosemide (LASIX) 40 MG tablet Take 1 tablet (40 mg total) by mouth daily.  30 tablet  1  . glipiZIDE (GLUCOTROL XL) 10 MG 24 hr tablet Take 10 mg by mouth 2 (two) times daily.      Marland Kitchen levETIRAcetam (KEPPRA) 1000 MG tablet Take 1,000 mg by mouth 2 (two) times daily.      Marland Kitchen levothyroxine (SYNTHROID, LEVOTHROID) 50 MCG tablet Take 50 mcg by mouth daily.      Marland Kitchen lisinopril  (PRINIVIL,ZESTRIL) 5 MG tablet Take 5 mg by mouth daily.      . metoprolol tartrate (LOPRESSOR) 25 MG tablet Take 25 mg by mouth 2 (two) times daily.      Marland Kitchen omeprazole (PRILOSEC) 20 MG capsule Take 20 mg by mouth 2 (two) times daily.      Marland Kitchen oxyCODONE-acetaminophen (PERCOCET) 10-325 MG per tablet Take 1 tablet by mouth 3 (three) times daily as needed for pain. For pain  15 tablet  0  . QUEtiapine (SEROQUEL XR) 50 MG TB24 Take 50 mg by mouth daily. At 6pm      . spironolactone (ALDACTONE) 25 MG tablet Take 25 mg by mouth daily.      Marland Kitchen terazosin (HYTRIN) 2 MG capsule Take 4 mg by mouth at bedtime.        Assessment: Elizabeth Love is a 56 yo F transferred from [facility] with R leg pain and D-Dimer 2.97.  Her admission INR is elevated at 2.1.  I called [facility] and spoke with the patient and her brother and there is no record of coumadin therapy listed.  LFTs  wnl, albumin 3.4.  She was started on a heparin drip at 10 units/kg/hr at 0521 am at [facility] and then turned off about an hour ago by RN.  She just came back from radiology.  Her baseline H/H is low at 9.6/29.1 and her PLTC is 179.  Goal of Therapy:  Heparin level 0.3-0.7 units/ml Monitor platelets by anticoagulation protocol: Yes   Plan:  1. Resume  heparin at current rate of 700 units/hr and check HL in 6 hrs, no bolus 2nd INR 2.1 for now and no hx of coumadin 2. Daily HL, CBC 3. F/u results of dopper and CTA for R/O DVT and PE 4. Glucotrol XL 10 bid, lisinopril 5mg  qday, terazosin 24mg  qhs, Seroquel XR 50 qday at 1800, metoprolol 25 bid instead of 12.5 mg bid, spironolactone 25 qday listed as home med No longer taking buspar as ordered in hospital Herby Abraham, Pharm.D. [phone] 07/18/2012 9:59 AM

## 2012-07-19 ENCOUNTER — Encounter (HOSPITAL_COMMUNITY): Payer: Self-pay | Admitting: Anesthesiology

## 2012-07-19 ENCOUNTER — Inpatient Hospital Stay (HOSPITAL_COMMUNITY): Payer: Medicare Other

## 2012-07-19 ENCOUNTER — Inpatient Hospital Stay (HOSPITAL_COMMUNITY): Payer: Medicare Other | Admitting: Anesthesiology

## 2012-07-19 ENCOUNTER — Encounter (HOSPITAL_COMMUNITY)
Admission: EM | Disposition: A | Discharge status: Home / Self Care | Payer: Self-pay | Source: Home / Self Care | Attending: Internal Medicine

## 2012-07-19 DIAGNOSIS — I428 Other cardiomyopathies: Secondary | ICD-10-CM

## 2012-07-19 DIAGNOSIS — D689 Coagulation defect, unspecified: Secondary | ICD-10-CM

## 2012-07-19 DIAGNOSIS — R809 Proteinuria, unspecified: Secondary | ICD-10-CM

## 2012-07-19 DIAGNOSIS — M7989 Other specified soft tissue disorders: Secondary | ICD-10-CM

## 2012-07-19 DIAGNOSIS — I8010 Phlebitis and thrombophlebitis of unspecified femoral vein: Secondary | ICD-10-CM

## 2012-07-19 DIAGNOSIS — M79609 Pain in unspecified limb: Secondary | ICD-10-CM

## 2012-07-19 DIAGNOSIS — D696 Thrombocytopenia, unspecified: Secondary | ICD-10-CM

## 2012-07-19 HISTORY — PX: VENA CAVA FILTER PLACEMENT: SHX1085

## 2012-07-19 LAB — COMPREHENSIVE METABOLIC PANEL
ALT: 12 U/L (ref 0–35)
Albumin: 3.1 g/dL — ABNORMAL LOW (ref 3.5–5.2)
Alkaline Phosphatase: 73 U/L (ref 39–117)
Calcium: 9.2 mg/dL (ref 8.4–10.5)
Potassium: 4.1 mEq/L (ref 3.5–5.1)
Sodium: 138 mEq/L (ref 135–145)
Total Protein: 6.9 g/dL (ref 6.0–8.3)

## 2012-07-19 LAB — URINALYSIS, ROUTINE W REFLEX MICROSCOPIC
Ketones, ur: NEGATIVE mg/dL
Leukocytes, UA: NEGATIVE
Nitrite: NEGATIVE
Protein, ur: 30 mg/dL — AB

## 2012-07-19 LAB — CBC
HCT: 31.1 % — ABNORMAL LOW (ref 36.0–46.0)
Hemoglobin: 9.6 g/dL — ABNORMAL LOW (ref 12.0–15.0)
MCHC: 30.9 g/dL (ref 30.0–36.0)
MCV: 78.5 fL (ref 78.0–100.0)
RDW: 20.9 % — ABNORMAL HIGH (ref 11.5–15.5)
WBC: 6.2 10*3/uL (ref 4.0–10.5)

## 2012-07-19 LAB — GLUCOSE, CAPILLARY
Glucose-Capillary: 57 mg/dL — ABNORMAL LOW (ref 70–99)
Glucose-Capillary: 104 mg/dL — ABNORMAL HIGH (ref 70–99)

## 2012-07-19 LAB — RAPID URINE DRUG SCREEN, HOSP PERFORMED
Amphetamines: NOT DETECTED
Barbiturates: NOT DETECTED
Benzodiazepines: POSITIVE — AB
Tetrahydrocannabinol: NOT DETECTED

## 2012-07-19 LAB — SALICYLATE LEVEL: Salicylate Lvl: <2 mg/dL — ABNORMAL LOW (ref 2.8–20.0)

## 2012-07-19 LAB — URINE MICROSCOPIC-ADD ON

## 2012-07-19 LAB — ANTITHROMBIN III: AntiThromb III Func: 79 % (ref 75–120)

## 2012-07-19 LAB — APTT: aPTT: 43 seconds — ABNORMAL HIGH (ref 24–37)

## 2012-07-19 LAB — AMMONIA: Ammonia: 38 umol/L (ref 11–60)

## 2012-07-19 SURGERY — INSERTION, FILTER, VENA CAVA
Anesthesia: Monitor Anesthesia Care | Site: Groin | Wound class: Clean

## 2012-07-19 MED ORDER — EPHEDRINE SULFATE 50 MG/ML IJ SOLN
INTRAMUSCULAR | Status: DC | PRN
  Administered 2012-07-19 (×2): 10 mg via INTRAVENOUS

## 2012-07-19 MED ORDER — ONDANSETRON HCL 4 MG/2ML IJ SOLN: 4.0000 mg | Freq: Once | INTRAMUSCULAR | Status: DC | PRN

## 2012-07-19 MED ORDER — DIGOXIN 0.25 MG/ML IJ SOLN
0.2500 mg | Freq: Once | INTRAMUSCULAR | Status: AC
  Administered 2012-07-19: 0.25 mg via INTRAVENOUS
  Filled 2012-07-19: qty 1

## 2012-07-19 MED ORDER — OXYCODONE HCL 5 MG/5ML PO SOLN: 5.0000 mg | Freq: Once | ORAL | Status: DC | PRN

## 2012-07-19 MED ORDER — QUETIAPINE FUMARATE ER 50 MG PO TB24
50.0000 mg | ORAL_TABLET | Freq: Every day | ORAL | Status: DC
Start: 2012-07-19 — End: 2012-07-19

## 2012-07-19 MED ORDER — SPIRONOLACTONE 25 MG PO TABS
25.0000 mg | ORAL_TABLET | Freq: Every day | ORAL | Status: DC
  Filled 2012-07-19: qty 1

## 2012-07-19 MED ORDER — HYDROMORPHONE HCL PF 1 MG/ML IJ SOLN
INTRAMUSCULAR | Status: AC
  Filled 2012-07-19: qty 1

## 2012-07-19 MED ORDER — SODIUM CHLORIDE 0.9 % IR SOLN
Status: DC | PRN
  Administered 2012-07-19: 15:00:00

## 2012-07-19 MED ORDER — LIDOCAINE-EPINEPHRINE 0.5 %-1:200000 IJ SOLN
INTRAMUSCULAR | Status: DC | PRN
  Administered 2012-07-19: 50 mL

## 2012-07-19 MED ORDER — DEXTROSE 50 % IV SOLN
INTRAVENOUS | Status: AC
  Filled 2012-07-19: qty 50

## 2012-07-19 MED ORDER — IOHEXOL 300 MG/ML  SOLN
INTRAMUSCULAR | Status: DC | PRN
  Administered 2012-07-19: 50 mL via INTRAVENOUS

## 2012-07-19 MED ORDER — PROPOFOL INFUSION 10 MG/ML OPTIME
INTRAVENOUS | Status: DC | PRN
  Administered 2012-07-19: 50 ug/kg/min via INTRAVENOUS

## 2012-07-19 MED ORDER — HYDROMORPHONE HCL PF 1 MG/ML IJ SOLN
0.2500 mg | INTRAMUSCULAR | Status: DC | PRN
  Administered 2012-07-19: 0.5 mg via INTRAVENOUS

## 2012-07-19 MED ORDER — TERAZOSIN HCL 2 MG PO CAPS: 4.0000 mg | ORAL_CAPSULE | Freq: Every day | ORAL | Status: DC

## 2012-07-19 MED ORDER — DIGOXIN 125 MCG PO TABS
0.1250 mg | ORAL_TABLET | Freq: Every day | ORAL | Status: DC
  Administered 2012-07-20 – 2012-07-22 (×3): 0.125 mg via ORAL
  Filled 2012-07-19 (×3): qty 1

## 2012-07-19 MED ORDER — FENTANYL CITRATE 0.05 MG/ML IJ SOLN
INTRAMUSCULAR | Status: DC | PRN
  Administered 2012-07-19 (×3): 50 ug via INTRAVENOUS

## 2012-07-19 MED ORDER — QUETIAPINE FUMARATE ER 50 MG PO TB24
50.0000 mg | ORAL_TABLET | Freq: Every day | ORAL | Status: DC
  Administered 2012-07-19 – 2012-07-22 (×4): 50 mg via ORAL
  Filled 2012-07-19 (×5): qty 1

## 2012-07-19 MED ORDER — LEVETIRACETAM 500 MG PO TABS
1000.0000 mg | ORAL_TABLET | Freq: Two times a day (BID) | ORAL | Status: DC
  Administered 2012-07-19 – 2012-07-22 (×4): 1000 mg via ORAL
  Filled 2012-07-19 (×7): qty 2

## 2012-07-19 MED ORDER — DEXTROSE 50 % IV SOLN
25.0000 mL | Freq: Once | INTRAVENOUS | Status: AC | PRN
  Administered 2012-07-19: 25 mL via INTRAVENOUS

## 2012-07-19 MED ORDER — SODIUM CHLORIDE 0.9 % IR SOLN
Status: DC | PRN
  Administered 2012-07-19: 1000 mL

## 2012-07-19 MED ORDER — LIDOCAINE-EPINEPHRINE 0.5 %-1:200000 IJ SOLN
INTRAMUSCULAR | Status: AC
  Filled 2012-07-19: qty 1

## 2012-07-19 MED ORDER — LACTATED RINGERS IV SOLN
INTRAVENOUS | Status: DC | PRN
  Administered 2012-07-19: 15:00:00 via INTRAVENOUS

## 2012-07-19 MED ORDER — MEPERIDINE HCL 25 MG/ML IJ SOLN: 6.2500 mg | INTRAMUSCULAR | Status: DC | PRN

## 2012-07-19 MED ORDER — MIDAZOLAM HCL 5 MG/5ML IJ SOLN
INTRAMUSCULAR | Status: DC | PRN
  Administered 2012-07-19 (×2): 1 mg via INTRAVENOUS

## 2012-07-19 MED ORDER — OXYCODONE HCL 5 MG PO TABS: 5.0000 mg | ORAL_TABLET | Freq: Once | ORAL | Status: DC | PRN

## 2012-07-19 MED ORDER — LEVOTHYROXINE SODIUM 50 MCG PO TABS
50.0000 ug | ORAL_TABLET | Freq: Every day | ORAL | Status: DC
  Administered 2012-07-21 – 2012-07-22 (×2): 50 ug via ORAL
  Filled 2012-07-19 (×3): qty 1

## 2012-07-19 MED ORDER — PHENYLEPHRINE HCL 10 MG/ML IJ SOLN
INTRAMUSCULAR | Status: DC | PRN
Start: 2012-07-19 — End: 2012-07-19
  Administered 2012-07-19 (×5): 80 ug via INTRAVENOUS

## 2012-07-19 SURGICAL SUPPLY — 45 items
BAG DECANTER FOR FLEXI CONT (MISCELLANEOUS) ×2 IMPLANT
BLADE SURG 11 STRL SS (BLADE) ×1 IMPLANT
CLIP LIGATING EXTRA MED SLVR (CLIP) ×1 IMPLANT
CLIP LIGATING EXTRA SM BLUE (MISCELLANEOUS) ×1 IMPLANT
CLOTH BEACON ORANGE TIMEOUT ST (SAFETY) ×2 IMPLANT
COVER SURGICAL LIGHT HANDLE (MISCELLANEOUS) ×2 IMPLANT
COVER TABLE BACK 60X90 (DRAPES) ×2 IMPLANT
DRAPE C-ARM 42X72 X-RAY (DRAPES) ×2 IMPLANT
DRAPE LAPAROTOMY T 102X78X121 (DRAPES) ×2 IMPLANT
FILTER ECLIPSE VC FEMORAL (Filter) ×2 IMPLANT
GAUZE SPONGE 2X2 8PLY STRL LF (GAUZE/BANDAGES/DRESSINGS) IMPLANT
GAUZE SPONGE 4X4 16PLY XRAY LF (GAUZE/BANDAGES/DRESSINGS) ×1 IMPLANT
GLOVE BIOGEL PI IND STRL 6.5 (GLOVE) IMPLANT
GLOVE BIOGEL PI IND STRL 7.0 (GLOVE) IMPLANT
GLOVE BIOGEL PI INDICATOR 6.5 (GLOVE) ×2
GLOVE BIOGEL PI INDICATOR 7.0 (GLOVE) ×1
GLOVE ECLIPSE 6.5 STRL STRAW (GLOVE) ×2 IMPLANT
GLOVE SS BIOGEL STRL SZ 7.5 (GLOVE) ×1 IMPLANT
GLOVE SUPERSENSE BIOGEL SZ 7.5 (GLOVE) ×1
GOWN STRL NON-REIN LRG LVL3 (GOWN DISPOSABLE) ×5 IMPLANT
KIT BASIN OR (CUSTOM PROCEDURE TRAY) ×2 IMPLANT
KIT ROOM TURNOVER OR (KITS) ×2 IMPLANT
NDL HYPO 25GX1X1/2 BEV (NEEDLE) IMPLANT
NDL PERC 18GX7CM (NEEDLE) IMPLANT
NEEDLE 22X1 1/2 (OR ONLY) (NEEDLE) IMPLANT
NEEDLE HYPO 25GX1X1/2 BEV (NEEDLE) IMPLANT
NEEDLE PERC 18GX7CM (NEEDLE) ×2 IMPLANT
NS IRRIG 1000ML POUR BTL (IV SOLUTION) ×2 IMPLANT
PACK SURGICAL SETUP 50X90 (CUSTOM PROCEDURE TRAY) ×2 IMPLANT
PAD ARMBOARD 7.5X6 YLW CONV (MISCELLANEOUS) ×4 IMPLANT
SHEATH BRITE TIP 7FRX11 (SHEATH) ×1 IMPLANT
SPONGE GAUZE 2X2 STER 10/PKG (GAUZE/BANDAGES/DRESSINGS) ×1
SPONGE GAUZE 4X4 12PLY (GAUZE/BANDAGES/DRESSINGS) IMPLANT
SPONGE LAP 18X18 X RAY DECT (DISPOSABLE) IMPLANT
SUT ETHILON 3 0 PS 1 (SUTURE) IMPLANT
SYR 20ML ECCENTRIC (SYRINGE) ×2 IMPLANT
SYR 30ML LL (SYRINGE) ×3 IMPLANT
SYR 5ML LL (SYRINGE) ×2 IMPLANT
SYR CONTROL 10ML LL (SYRINGE) ×2 IMPLANT
TAPE CLOTH SURG 4X10 WHT LF (GAUZE/BANDAGES/DRESSINGS) ×1 IMPLANT
TOWEL OR 17X24 6PK STRL BLUE (TOWEL DISPOSABLE) ×2 IMPLANT
TOWEL OR 17X26 10 PK STRL BLUE (TOWEL DISPOSABLE) ×2 IMPLANT
WATER STERILE IRR 1000ML POUR (IV SOLUTION) ×2 IMPLANT
WIRE BENTSON .035X145CM (WIRE) ×1 IMPLANT
WIRE J 3MM .035X145CM (WIRE) ×2 IMPLANT

## 2012-07-19 NOTE — Anesthesia Postprocedure Evaluation (Signed)
Anesthesia Post Note  Patient: Elizabeth Love  Procedure(s) Performed: Procedure(s) (LRB): INSERTION VENA-CAVA FILTER ([name])  Anesthesia type: general  Patient location: PACU  Post pain: Pain level controlled  Post assessment: Patient's Cardiovascular Status Stable  Last Vitals:  Filed Vitals:   07/19/12 1713  BP:   Pulse: 88  Temp:   Resp: 26    Post vital signs: Reviewed and stable  Level of consciousness: sedated  Complications: No apparent anesthesia complications

## 2012-07-19 NOTE — Op Note (Signed)
OPERATIVE REPORT  DATE OF SURGERY: 07/19/2012  PATIENT: Elizabeth Love, 56 y.o. female MRN: [id number]  DOB: 08-Dec-1955  PRE-OPERATIVE DIAGNOSIS: DVT with coagulopathy complicating anticoagulation therapy  POST-OPERATIVE DIAGNOSIS:  Same  PROCEDURE: Inferior vena cava filter  SURGEON:  Gretta Began, M.D.    ANESTHESIA:  Local with sedation  EBL: Minimal ml  Total I/O In: 850 [P.O.:50; I.V.:800] Out: 110 [Urine:100; Blood:10]  BLOOD ADMINISTERED: None none  DRAINS: None  SPECIMEN: None  COUNTS CORRECT:  YES  PLAN OF CARE: PACU with KUB pending   PATIENT DISPOSITION:  PACU - hemodynamically stable  PROCEDURE DETAILS: Patient was taken to the operating room placed supine position where the area of both groins are prepped in a sterile fashion. Using local anesthesia an ultrasound visualization the right common femoral vein was accessed with an 18-gauge needle. Using Seldinger technique guidewire specimen of level of the vena cava. This was confirmed with fluoroscopy. The filter sheath was then passed over the guidewire to the level of L2. Injection through the sheath revealed the level of the renal veins showed that the cava was not too large for filter placement. The dilator was removed and the filter was placed through the delivery sheath. It was positioned the top of the filter at the top of L2 which was below the level of the renal veins. It was deployed in the sheath was removed and pressure was held for hemostasis. Patient was transferred to the recovery room in stable condition   Gretta Began, M.D. 07/19/2012 5:12 PM

## 2012-07-19 NOTE — Consult Note (Signed)
ONCOLOGY  HOSPITAL CONSULTATION NOTE  [name]                                MR#: [id number]  DOB: 12-25-1955                       CSN#: [id number]  Referring MD: [facility]  Primary MD: ? [facility]  Reason for Consult: RLE  DVT/ coagulopathy  NWG:NFAOZHY Elizabeth Love is a 56 y.o. 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  Workup at the ED revealed elevated D Dimer at 2.97. Fibrinogen was low at 159.  Initial PT was 25.4 with  INR  at 2.10.PTT was 44. Platelets were 179 k.   Heparin drip was initiated per Pharmacy, changed to Lovenox  60 mg sq q 12 hrs due to patient's refusal to get lab work. Duplex of lower extremities was incomplete due to patient's agitation but positive for an acute DVT of the right popliteal vein and possibly the right posterior tibial vein; Repeat doppler today, show indeed Positive of acute DVT of the right popliteal vein and the left mid posterior tibial vein coursing through popliteal vein. No evidence of superficial thrombosis or Baker's cyst bilaterally.  . Of note, she had been evaluated in August of this year for SOB at which time, a CT angio of the chest on 04/22/12 performed when patient was admitted for shortness of breath was negative for PE, and dopplers at the time showed no evidence of DVT.CXR on 10/31 did not show PE, but massive cardiomegaly and mild interstitial edema.  No Hypercoagulable panel was  drawn prior to anticoagulation .Current PT/ INR  Is 26.5/2.59 in the setting of possible liver involvement due to cardiomyopathy. Her status is now complicated by Kirby Funk /RVR/ Metabolic acidosis currently managed by the proper specialties No recent surgeries. No recent  trauma.  No recent long distance trips. Limited mobility.  No Hormonal replacement .  We were kindly asked to see the patient with recommendations from the hematological standpoint.    PMH:  Past Medical History  Diagnosis Date  . Seizures   . Hypertension   . Stroke     2007 - R sided weakness  . Asthma   . Diabetes mellitus   . 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 Thyroid disease   . LV dysfunction     Per brother, EF 17% s/p AICD 2007 ([employer]). No hx CAD.  Chronic RUQ pain  Surgeries:  Past Surgical History  Procedure Date  . Cholecystectomy   . Appendectomy   . Pacemaker insertion     Allergies:  Allergies  Allergen Reactions  . Penicillins Rash  . Sulfa Antibiotics Rash    Medications:   Prior to Admission:  Prescriptions prior to admission  Medication Sig Dispense Refill  . acetaminophen (TYLENOL) 650 MG CR tablet Take 650 mg by mouth every 8 (eight) hours as needed. For pain      . albuterol (PROVENTIL) (2.5 MG/3ML) 0.083% nebulizer solution Take 2.5 mg by nebulization every 6 (six) hours as  needed. For cough or wheeze      . ALPRAZolam (XANAX) 0.25 MG tablet Take 0.25 mg by mouth at bedtime as needed. For sleep      . furosemide (LASIX) 40 MG tablet Take 1 tablet (40 mg total) by mouth daily.  30 tablet  1  . glipiZIDE (GLUCOTROL XL) 10 MG 24 hr tablet Take 10 mg by mouth 2 (two) times daily.      Marland Kitchen levETIRAcetam (KEPPRA) 1000 MG tablet Take 1,000 mg by mouth 2 (two) times daily.      Marland Kitchen levothyroxine (SYNTHROID, LEVOTHROID) 50 MCG tablet Take 50 mcg by mouth daily.      Marland Kitchen lisinopril (PRINIVIL,ZESTRIL) 5 MG tablet Take 5 mg by mouth daily.      . metoprolol tartrate (LOPRESSOR) 25 MG tablet Take 25 mg by mouth 2 (two) times daily.      Marland Kitchen omeprazole (PRILOSEC) 20 MG capsule Take 20 mg by mouth 2 (two) times daily.      Marland Kitchen oxyCODONE-acetaminophen (PERCOCET) 10-325 MG per tablet Take 1 tablet by mouth 3 (three) times daily as needed for pain. For pain   15 tablet  0  . QUEtiapine (SEROQUEL XR) 50 MG TB24 Take 50 mg by mouth daily. At 6pm      . spironolactone (ALDACTONE) 25 MG tablet Take 25 mg by mouth daily.      Marland Kitchen terazosin (HYTRIN) 2 MG capsule Take 4 mg by mouth at bedtime.        WUJ:WJXBJY chloride, acetaminophen, acetaminophen, albuterol, ALPRAZolam, LORazepam, ondansetron (ZOFRAN) IV, ondansetron, oxyCODONE, sodium chloride, technetium albumin aggregated  ROS: Unable to assess due to aphasia.  Family History:    Family History  Problem Relation Age of Onset  . Coronary artery disease Neg Hx     No family history of bleeding disorders. No family history of strokes, PE or DVT.   Social History  Single. 1 son whom she lives with in [facility]. Prior smoker until 2007, 8 pack/years. No ETOH or recreational drug history.   Physical Exam    Filed Vitals:   07/19/12 0426  BP: 115/73  Pulse: 131  Temp: 97.2 F (36.2 C)  Resp: 18      General:56 y.o. AAF female. in no acute distress, awake.  HEENT: Normocephalic, atraumatic, PERRLA. Oral cavity without thrush or lesions. Neck supple. no thyromegaly, no cervical or supraclavicular adenopathy  Lungs clear bilaterally . No wheezing, rhonchi or rales. No axillary masses. Breasts: not examined. Cardiac irregularly irregular rate and rhythm normal S1-S2, no murmur , rubs or gallops Abdomen soft , mild RUQ discomfort with deep palpation. ,bowel sounds x4. No HSM. No masses palpable.  GU/rectal: deferred. Extremities no clubbing cyanosis.1+ RLE  Edema. No bruising or petechial rash Musculoskeletal: no spinal tenderness.  Neuro: Right sided weakness, unable to engage in fluent conversation, unable to verbalize concrete answers. Able to follow minimal commands. Flat affect.   Labs:  CBC   Lab 07/19/12 0630 07/18/12 2004 07/18/12 0237  WBC 6.2 -- 5.6  HGB 9.6* -- 9.6*  HCT 31.1* -- 29.1*  PLT 186 143* 179  MCV 78.5 -- 74.4*  MCH 24.2* -- 24.6*  MCHC 30.9 -- 33.0  RDW  20.9* -- 21.0*  LYMPHSABS -- -- 1.2  MONOABS -- -- 0.4  EOSABS -- -- 0.1  BASOSABS -- -- 0.0  BANDABS -- -- --     CMP    Lab 07/19/12 0630 07/18/12 0240 07/18/12 0237  NA 138 -- 138  K 4.1 -- 3.5  CL 103 -- 103  CO2 17* -- 19  GLUCOSE 160* -- 126*  BUN 20 -- 15  CREATININE 1.19* -- 0.90  CALCIUM 9.2 -- 9.2  MG -- -- --  AST 22 33 --  ALT 12 13 --  ALKPHOS 73 71 --  BILITOT 2.0* 1.2 --        Component Value Date/Time   BILITOT 2.0* 07/19/2012 0630   BILIDIR 0.7* 07/18/2012 0240   IBILI 0.5 07/18/2012 0240      Lab 07/19/12 0630 07/18/12 2004 07/18/12 0237  INR 2.59* 2.44* 2.10*  PROTIME -- -- --     Alvira Philips 07/18/12 2004 07/18/12 0237  DDIMER 2.58* 2.97*     Anemia panel:  No results found for this basename: VITAMINB12:2,FOLATE:2,FERRITIN:2,TIBC:2,IRON:2,RETICCTPCT:2 in the last 72 hours   Imaging Studies:  Dg Chest 1 View  07/18/2012  *RADIOLOGY REPORT*  Clinical Data: Right lower extremity pain and swelling.  CHEST - 1 VIEW  Comparison: Chest 05/02/2012 and CT chest 04/23/2012.  Findings: AICD is in place.  There is massive cardiomegaly and mild interstitial edema.  Left basilar atelectasis is noted.  No pneumothorax.  IMPRESSION: Massive cardiomegaly and mild interstitial edema.   Original Report Authenticated By: Bernadene Bell. D'ALESSIO, M.D.    Dg Tibia/fibula Right  07/18/2012  *RADIOLOGY REPORT*  Clinical Data: Swelling and pain.  No known injury.  RIGHT TIBIA AND FIBULA - 2 VIEW  Comparison: None.  Findings: There is diffuse soft tissue swelling.  No soft tissue gas collection or radiopaque foreign body is identified.  There is no fracture or dislocation.  No bony destructive change is noted. Bones appear osteopenic.  IMPRESSION:  1.  Diffuse soft tissue swelling without underlying focal bony or joint abnormality. 2.  Osteopenia.   Original Report Authenticated By: Bernadene Bell. D'ALESSIO, M.D.    Dg Ankle Complete Right  07/18/2012  *RADIOLOGY  REPORT*  Clinical Data: Diffuse soft tissue swelling.  No known injury.  RIGHT ANKLE - COMPLETE 3+ VIEW  Comparison: None.  Findings: Diffuse and marked soft tissue swelling is identified. No soft tissue gas collection or radiopaque foreign body is identified.  There is no fracture or dislocation.  Bones appear osteopenic.  No bony destructive change.  IMPRESSION:  1.  Diffuse soft tissue swelling without underlying focal bony abnormality. 2.  Osteopenia.   Original Report Authenticated By: Bernadene Bell. Maricela Curet, M.D.       A/P: 56 y.o. female with multiple cardiac issues, as well as a history of CVA in 2007 with R sided weakness resdual not on Coumadin, admitted on 10/30 with R LE pain and swelling, with dopplers positive for DVT. Initially on Heparin drip, now on Lovenox 60 mg bid per pharmacy recommendations. Dr. Myna Hidalgo  is to see the patient following this consult with recommendations regarding diagnosis and  further workup studies.Because of the patient being on anticoagulation, some of the panel results could be altered.  Will discuss with Hematologist if perhaps  Prot G Mutation, Homocysteine,Lupus Anticoagulant, Factor V Leiden Mutation and Anticardiolipin Antibody could be considered.. An addendum to this note is to be written. Thank you for the referral.  [facility] E 07/19/2012 8:31 AM  ADDENDUM:  Very difficult situation. Pretty complicated.  Having significant cardiomyopathy is a real problem.  Atrial fib also is not helping the situation.  I do not have an explanation for the elevated PT off coumadin.    I am very interested in the proteinuria.  I  suspect that she may have nephrotic syndrome.  If so, this would be likely cause for hypercoag state.  A 24 hr urine is needed.  Typically, we see decreased AT IIII levels as well as lower Protein C/S levels.    I can't argue with placement of the IVC filter.    I believe that she will need long-term anticoagulation.  I would keep her on  Lovenox for now.  We may can consider Xarelto for long-term oral therapy. I would not start this for now until we have a better sense of what is the underlying pathology here.  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  Will follow closely.  Pete E.  Hebrews 12:12

## 2012-07-19 NOTE — Progress Notes (Signed)
Repeat blood sugar 111

## 2012-07-19 NOTE — Progress Notes (Signed)
Pt with A.fib sustaining in the 130s-150s last PM. Pt agitated at this time as well.Pt had refused her scheduled meds throughout the day. BP stable. MD notified. Orders received for PRN IV Ativan and IV Metoprolol.

## 2012-07-19 NOTE — Progress Notes (Signed)
Utilization Review Completed.   Halston Fairclough, RN, BSN Nurse Case Manager  [phone]  

## 2012-07-19 NOTE — Anesthesia Preprocedure Evaluation (Addendum)
Anesthesia Evaluation  Patient identified by MRN, date of birth, ID band Patient awake and Patient confused    Reviewed: Allergy & Precautions, H&P , NPO status , Patient's Chart, lab work & pertinent test results, reviewed documented beta blocker date and time   Airway Mallampati: III TM Distance: >3 FB Neck ROM: Full  Mouth opening: Limited Mouth Opening  Dental  (+) Teeth Intact   Pulmonary asthma ,          Cardiovascular hypertension, Pt. on medications and Pt. on home beta blockers + dysrhythmias Atrial Fibrillation + pacemaker     Neuro/Psych Seizures -,  CVA, Residual Symptoms    GI/Hepatic   Endo/Other  diabetes  Renal/GU      Musculoskeletal   Abdominal   Peds  Hematology   Anesthesia Other Findings   Reproductive/Obstetrics                           Anesthesia Physical Anesthesia Plan  ASA: III  Anesthesia Plan: MAC   Post-op Pain Management:    Induction: Intravenous  Airway Management Planned: Natural Airway  Additional Equipment:   Intra-op Plan:   Post-operative Plan:   Informed Consent: I have reviewed the patients History and Physical, chart, labs and discussed the procedure including the risks, benefits and alternatives for the proposed anesthesia with the patient or authorized representative who has indicated his/her understanding and acceptance.   Dental advisory given  Plan Discussed with: CRNA and Surgeon  Anesthesia Plan Comments:        Anesthesia Quick Evaluation

## 2012-07-19 NOTE — Progress Notes (Signed)
Pt with a 6 beat run of V.tach and a 4 beat run of V.tach last PM. Pt asymptomatic. VS stable. MD notified. Pt with defibrillator and EF = 10%. No new orders at this time. Will continue to monitor.

## 2012-07-19 NOTE — Progress Notes (Signed)
[facility] PROGRESS NOTE  Elizabeth Love JYN:[id number] DOB: 06-Feb-1956 DOA: 07/18/2012 PCP: No primary provider on file.  Assessment/Plan: Possible right lower extremity DVT -Patient was agitated and uncooperative with ultrasound--study was incomplete -Continue Lovenox for now -Discussed with patient this morning, she is willing to retry the ultrasound Coagulopathy -Concerned about smoldering DIC due to decrease fibrinogen and elevated PT/PTT -Cannot rule out underlying autoimmune disease particularly lupus anticoagulant -Consult hematology/oncology in the face of possible new DVT with coagulopathy -May need IVC filter -Suspect patient may have underlying cirrhosis given chronic hepatic congestion from low perfusion due to cardiomyopathy Atrial fibrillation with RVR -Patient refused metoprolol last night -Start digoxin -Calcium channel blocker not a great option due to the patient's cardiomyopathy -Check TSH, urine toxicology Cardiomyopathy -Chronic systolic CHF -Patient appears euvolemic and compensated at this time - Ejection fraction 10% status post AICD -Low blood pressure has precluded use of ACE inhibitor -brother states pt did not have MI History of stroke 2007 -Residual right hemiparesis stable -only takes ASA at home -patient and brother are poor historians Abdominal pain -Suspect constipation -This has been chronic -Check 2-view abdomen Diabetes mellitus type 2 -Hemoglobin A1c 7.1 -Continue NovoLog sliding scale -Hold oral hypoglycemics Metabolic acidosis -Check ammonia, lactic acid, salicylate level, ethylene glycol Former Tobacco user -quit 2007 -8 pk yrs       Disposition Plan:   Home when medically stable, discussed with brother TOTAL time today 85 min.       Procedures/Studies: Dg Chest 1 View  07/18/2012  *RADIOLOGY REPORT*  Clinical Data: Right lower extremity pain and swelling.  CHEST - 1 VIEW  Comparison: Chest 05/02/2012 and CT  chest 04/23/2012.  Findings: AICD is in place.  There is massive cardiomegaly and mild interstitial edema.  Left basilar atelectasis is noted.  No pneumothorax.  IMPRESSION: Massive cardiomegaly and mild interstitial edema.   Original Report Authenticated By: Bernadene Bell. D'ALESSIO, M.D.    Dg Tibia/fibula Right  07/18/2012  *RADIOLOGY REPORT*  Clinical Data: Swelling and pain.  No known injury.  RIGHT TIBIA AND FIBULA - 2 VIEW  Comparison: None.  Findings: There is diffuse soft tissue swelling.  No soft tissue gas collection or radiopaque foreign body is identified.  There is no fracture or dislocation.  No bony destructive change is noted. Bones appear osteopenic.  IMPRESSION:  1.  Diffuse soft tissue swelling without underlying focal bony or joint abnormality. 2.  Osteopenia.   Original Report Authenticated By: Bernadene Bell. D'ALESSIO, M.D.    Dg Ankle Complete Right  07/18/2012  *RADIOLOGY REPORT*  Clinical Data: Diffuse soft tissue swelling.  No known injury.  RIGHT ANKLE - COMPLETE 3+ VIEW  Comparison: None.  Findings: Diffuse and marked soft tissue swelling is identified. No soft tissue gas collection or radiopaque foreign body is identified.  There is no fracture or dislocation.  Bones appear osteopenic.  No bony destructive change.  IMPRESSION:  1.  Diffuse soft tissue swelling without underlying focal bony abnormality. 2.  Osteopenia.   Original Report Authenticated By: Bernadene Bell. Maricela Curet, M.D.          Subjective: Patient refused her VQ scan last night. She also was agitated and uncooperative with her venous ultrasound of the lower extremities. Patient states that she is willing to retry the studies. She denies any current chest discomfort, soreness breath, vomiting, diarrhea. She complains of abdominal pain. Denies any fevers or chills  Objective: Filed Vitals:   07/18/12 2114 07/19/12 0022 07/19/12 0028 07/19/12 8657  BP: 124/74 86/68 90/64  115/73  Pulse: 135 135 134 131  Temp: 97.5 F  (36.4 C)   97.2 F (36.2 C)  TempSrc: Oral   Oral  Resp:    18  Height:      Weight:    59.8 kg (131 lb 13.4 oz)  SpO2: 100%   96%    Intake/Output Summary (Last 24 hours) at 07/19/12 0752 Last data filed at 07/19/12 0500  Gross per 24 hour  Intake 1128.5 ml  Output     50 ml  Net 1078.5 ml   Weight change: -14.222 kg (-31 lb 5.7 oz) Exam:   General:  Pt is alert, follows commands appropriately, not in acute distress  HEENT: No icterus, No thrush, No neck mass, [place]/AT  Cardiovascular: IRRR,no rubs, no gallops  Respiratory: CTA bilaterally, no wheezing, no crackles, no rhonchi  Abdomen: Soft/+BS, , non distended, no guarding; mild tenderness right lower quadrant and left lower quadrant without any rebound  Extremities: 2+ right lower extremity edema No lymphangitis, No petechiae, No rashes, no synovitis  Data Reviewed: Basic Metabolic Panel:  Lab 07/18/12 1610  NA 138  K 3.5  CL 103  CO2 19  GLUCOSE 126*  BUN 15  CREATININE 0.90  CALCIUM 9.2  MG --  PHOS --   Liver Function Tests:  Lab 07/18/12 0240  AST 33  ALT 13  ALKPHOS 71  BILITOT 1.2  PROT 7.4  ALBUMIN 3.4*   No results found for this basename: LIPASE:5,AMYLASE:5 in the last 168 hours No results found for this basename: AMMONIA:5 in the last 168 hours CBC:  Lab 07/19/12 0630 07/18/12 2004 07/18/12 0237  WBC 6.2 -- 5.6  NEUTROABS -- -- 3.9  HGB 9.6* -- 9.6*  HCT 31.1* -- 29.1*  MCV 78.5 -- 74.4*  PLT 186 143* 179   Cardiac Enzymes:  Lab 07/18/12 0240  CKTOTAL --  CKMB --  CKMBINDEX --  TROPONINI <0.30   BNP: No components found with this basename: POCBNP:5 CBG:  Lab 07/19/12 0700 07/18/12 2336 07/18/12 1127  GLUCAP 158* 138* 80    No results found for this or any previous visit (from the past 240 hour(s)).   Scheduled Meds:   . acetaminophen  1,000 mg Oral Once  . digoxin  0.25 mg Intravenous Once  . enoxaparin  60 mg Subcutaneous Q12H  . insulin aspart  0-9 Units  Subcutaneous TID WC  . levETIRAcetam  500 mg Oral QID  . levothyroxine  75 mcg Oral Daily  . metoprolol  2.5 mg Intravenous Once  . metoprolol tartrate  12.5 mg Oral BID  . pantoprazole  40 mg Oral Daily  . sodium chloride  3 mL Intravenous Q12H  . sodium chloride  3 mL Intravenous Q12H  . DISCONTD: busPIRone  10 mg Oral q morning - 10a   Continuous Infusions:   . sodium chloride 100 mL/hr at 07/19/12 0109  . DISCONTD: heparin Stopped (07/18/12 1834)     Derrico Zhong, DO  [facility] Pager [phone]  If 7PM-7AM, please contact night-coverage [email or web address] Password [email or web address] 07/19/2012, 7:52 AM   LOS: 1 day

## 2012-07-19 NOTE — Transfer of Care (Signed)
Immediate Anesthesia Transfer of Care Note  Patient: Elizabeth Love  Procedure(s) Performed: Procedure(s) (LRB) with comments: INSERTION VENA-CAVA FILTER ([name])  Patient Location: PACU  Anesthesia Type:MAC  Level of Consciousness: awake and alert   Airway & Oxygen Therapy: Patient Spontanous Breathing  Post-op Assessment: Report given to PACU RN  Post vital signs: Reviewed and stable  Complications: No apparent anesthesia complications

## 2012-07-19 NOTE — Progress Notes (Signed)
Pt received from PACU setteled in room in At Fib. Rt groin dressing intact T Print production planner

## 2012-07-19 NOTE — Preoperative (Signed)
Beta Blockers   Reason not to administer Beta Blockers:Not Applicable 

## 2012-07-19 NOTE — Progress Notes (Signed)
Blood sugar 43 on arrival orders received from dr. Michelle Piper

## 2012-07-19 NOTE — Plan of Care (Signed)
Problem: Consults Goal: Diagnosis - Venous Thromboembolism (VTE) Choose a selection Outcome: Progressing DVT (Deep Vein Thrombosis)     

## 2012-07-19 NOTE — Progress Notes (Signed)
Pharmacist Heart Failure Core Measure Documentation  Assessment: Elizabeth Love has an EF documented as 10% on 04/23/12 by ECHO.  Rationale: Heart failure patients with left ventricular systolic dysfunction (LVSD) and an EF < 40% should be prescribed an angiotensin converting enzyme inhibitor (ACEI) or angiotensin receptor blocker (ARB) at discharge unless a contraindication is documented in the medical record.  This patient is not currently on an ACEI or ARB for HF.  This note is being placed in the record in order to provide documentation that a contraindication to the use of these agents is present for this encounter.  ACE Inhibitor or Angiotensin Receptor Blocker is contraindicated (specify all that apply)  []   ACEI allergy AND ARB allergy []   Angioedema []   Moderate or severe aortic stenosis []   Hyperkalemia [x]   Hypotension []   Renal artery stenosis []   Worsening renal function, preexisting renal disease or dysfunction   Fayne Norrie 07/19/2012 8:38 AM

## 2012-07-19 NOTE — Progress Notes (Signed)
Pt continued with A.fib/A.flutter in the 120s-140s. Pt hypotensive at this time. MD notified. Pt continues to refuse PO meds. Pt not a candidate for IV Cardizem due to EF =10%. Pt not able to take IV Metoprolol due to hypotension. MD spoke with Cardiologist about these issues. IVF increased from 87mL/hr to 100 mL/hr in order to make pt euvolemic in the case that IV Metoprolol would need to be given. MD instructed to notify again for sustained HR in the 130s or greater. Will continue to monitor.

## 2012-07-19 NOTE — Progress Notes (Signed)
Pt returned with pt from doppler venous study, Dr. Arbutus Leas verbally notified pt is positive for Bilateral DVT.

## 2012-07-19 NOTE — Progress Notes (Signed)
VASCULAR LAB PRELIMINARY  PRELIMINARY  PRELIMINARY  PRELIMINARY  Bilateral lower extremity venous duplex completed with sedation.  Preliminary report:  Positive of acute DVT of the right popliteal vein and the left mid posterior tibial vein coursing through popliteal vein.  No evidence of superficial thrombosis or Baker's cyst bilaterally.  Lael Pilch, RVS 07/19/2012, 11:36 AM

## 2012-07-19 NOTE — Consult Note (Signed)
We have been consult and for vena cava filter placement. The patient is a 56 year old female with prior history of major left brain stroke. She has some expressive aphasia and significant right side weakness. Workup regarding this stroke is not available. She was admitted with leg swelling. Venous duplex reveals bilateral DVT in her lower extremities. She has no specific contraindication to anticoagulation but does have relation abnormality with her in our elevated in the 2.5 range despite no pharmacologic anticoagulation. The patient does not recall any prior history of DVT. She is a relatively poor historian.  Past Medical History  Diagnosis Date  . Seizures   . Hypertension   . Stroke     2007 - R sided weakness  . Asthma   . Diabetes mellitus   . 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 Thyroid disease   . LV dysfunction     Per brother, EF 17% s/p AICD 2007 (St. Jude). No hx CAD.    History  Substance Use Topics  . Smoking status: Former Games developer  . Smokeless tobacco: Not on file  . Alcohol Use: No    Family History  Problem Relation Age of Onset  . Coronary artery disease Neg Hx     Allergies  Allergen Reactions  . Penicillins Rash  . Sulfa Antibiotics Rash    Current facility-administered medications:0.9 %  sodium chloride infusion, 250 mL, Intravenous, PRN, Penny Pia, MD;  0.9 %  sodium chloride infusion, , Intravenous, Continuous, Gerome Apley Hard[place], PA, Last Rate: 100 mL/hr at 07/19/12 0109;  acetaminophen (TYLENOL) suppository 650 mg, 650 mg, Rectal, Q6H PRN, Penny Pia, MD;  acetaminophen (TYLENOL) tablet 1,000 mg, 1,000 mg, Oral, Once, April K Palumbo-Rasch, MD acetaminophen (TYLENOL) tablet 650 mg, 650 mg, Oral, Q6H PRN, Penny Pia, MD;  albuterol (PROVENTIL) (5 MG/ML) 0.5% nebulizer solution 2.5 mg, 2.5 mg, Nebulization, Q4H PRN, Penny Pia, MD;  ALPRAZolam Prudy Feeler) tablet 0.25 mg, 0.25 mg, Oral, QHS PRN, Penny Pia, MD, 0.25 mg at 07/19/12 0919;  digoxin  (LANOXIN) 0.25 MG/ML injection 0.25 mg, 0.25 mg, Intravenous, Once, Onalee Hua Tat, MD, 0.25 mg at 07/19/12 0919 enoxaparin (LOVENOX) injection 60 mg, 60 mg, Subcutaneous, Q12H, Penny Pia, MD, 60 mg at 07/19/12 0657;  insulin aspart (novoLOG) injection 0-9 Units, 0-9 Units, Subcutaneous, TID WC, Penny Pia, MD, 2 Units at 07/19/12 1211;  levETIRAcetam (KEPPRA) tablet 500 mg, 500 mg, Oral, QID, Penny Pia, MD, 500 mg at 07/19/12 1337;  levothyroxine (SYNTHROID, LEVOTHROID) tablet 75 mcg, 75 mcg, Oral, Daily, Penny Pia, MD, 75 mcg at 07/19/12 1046 LORazepam (ATIVAN) injection 0.5 mg, 0.5 mg, Intravenous, Q8H PRN, Gerome Apley [place], PA, 0.5 mg at 07/18/12 2124;  metoprolol (LOPRESSOR) injection 2.5 mg, 2.5 mg, Intravenous, Once, Gerome Apley Hard[place], PA, 2.5 mg at 07/18/12 2124;  metoprolol tartrate (LOPRESSOR) tablet 12.5 mg, 12.5 mg, Oral, BID, Penny Pia, MD, 12.5 mg at 07/19/12 1046;  ondansetron (ZOFRAN) injection 4 mg, 4 mg, Intravenous, Q6H PRN, Penny Pia, MD ondansetron (ZOFRAN) tablet 4 mg, 4 mg, Oral, Q6H PRN, Penny Pia, MD;  oxyCODONE (Oxy IR/ROXICODONE) immediate release tablet 5 mg, 5 mg, Oral, Q4H PRN, Penny Pia, MD, 5 mg at 07/19/12 0815;  pantoprazole (PROTONIX) EC tablet 40 mg, 40 mg, Oral, Daily, Penny Pia, MD, 40 mg at 07/19/12 1046;  QUEtiapine (SEROQUEL XR) 24 hr tablet 50 mg, 50 mg, Oral, q1800, David Tat, MD sodium chloride 0.9 % injection 3 mL, 3 mL, Intravenous, Q12H, Penny Pia, MD, 3  mL at 07/19/12 1211;  sodium chloride 0.9 % injection 3 mL, 3 mL, Intravenous, Q12H, Penny Pia, MD, 3 mL at 07/18/12 1031;  sodium chloride 0.9 % injection 3 mL, 3 mL, Intravenous, PRN, Penny Pia, MD;  technetium albumin aggregated (MAA) injection solution 3 milli Curie, 3 milli Curie, Intravenous, Once PRN, Medication Radiologist, MD DISCONTD: busPIRone (BUSPAR) tablet 10 mg, 10 mg, Oral, q morning - 10a, Penny Pia, MD;  DISCONTD: heparin ADULT infusion 100 units/mL (25000 units/250  mL), 700 Units/hr, Intravenous, Continuous, Herby Abraham, PHARMD, 700 Units/hr at 07/18/12 1030  BP 103/76  Pulse 88  Temp 97.1 F (36.2 C) (Oral)  Resp 17  Ht 5\' 2"  (1.575 m)  Wt 131 lb 13.4 oz (59.8 kg)  BMI 24.11 kg/m2  SpO2 100%  Body mass index is 24.11 kg/(m^2).       Review of systems noted in her history and physical with nothing to add  Physical exam: Well-developed black female with obvious right sided weakness and expressive aphasia left side is neurologically intact. No acute distress Lower trim these with mild edema and no specific discomfort bilaterally. 2+ radial pulses Respirations nonlabored Abdomen soft nontender no masses Skin without ulcers or rashes  Impression and plan bilateral lower extremity DVT. Patient will be difficult for anticoagulation due to baseline INR in the 2.5 range. Agree with relative indication for vena cava filter. I discussed this at length with the patient and have recommended placement. She understands and we'll proceed later this afternoon.

## 2012-07-20 ENCOUNTER — Inpatient Hospital Stay (HOSPITAL_COMMUNITY): Payer: Medicare Other

## 2012-07-20 DIAGNOSIS — I82409 Acute embolism and thrombosis of unspecified deep veins of unspecified lower extremity: Secondary | ICD-10-CM

## 2012-07-20 LAB — CBC
HCT: 28.6 % — ABNORMAL LOW (ref 36.0–46.0)
Hemoglobin: 8.9 g/dL — ABNORMAL LOW (ref 12.0–15.0)
MCHC: 31.1 g/dL (ref 30.0–36.0)

## 2012-07-20 LAB — GLUCOSE, CAPILLARY
Glucose-Capillary: 28 mg/dL — CL (ref 70–99)
Glucose-Capillary: 56 mg/dL — ABNORMAL LOW (ref 70–99)
Glucose-Capillary: 167 mg/dL — ABNORMAL HIGH (ref 70–99)
Glucose-Capillary: 131 mg/dL — ABNORMAL HIGH (ref 70–99)
Glucose-Capillary: 95 mg/dL (ref 70–99)
Glucose-Capillary: 133 mg/dL — ABNORMAL HIGH (ref 70–99)
Glucose-Capillary: 131 mg/dL — ABNORMAL HIGH (ref 70–99)
Glucose-Capillary: 305 mg/dL — ABNORMAL HIGH (ref 70–99)

## 2012-07-20 LAB — T4, FREE: Free T4: 1.66 ng/dL (ref 0.80–1.80)

## 2012-07-20 LAB — BASIC METABOLIC PANEL
BUN: 18 mg/dL (ref 6–23)
Chloride: 107 mEq/L (ref 96–112)
Glucose, Bld: 35 mg/dL — CL (ref 70–99)
Potassium: 4.7 mEq/L (ref 3.5–5.1)

## 2012-07-20 MED ORDER — GLUCOSE 40 % PO GEL
1.0000 | ORAL | Status: DC | PRN
  Administered 2012-07-20: 37.5 g via ORAL
  Filled 2012-07-20: qty 1

## 2012-07-20 MED ORDER — DEXTROSE 50 % IV SOLN
50.0000 mL | Freq: Once | INTRAVENOUS | Status: AC | PRN
  Administered 2012-07-20: 50 mL via INTRAVENOUS

## 2012-07-20 MED ORDER — METOPROLOL TARTRATE 25 MG PO TABS
25.0000 mg | ORAL_TABLET | Freq: Two times a day (BID) | ORAL | Status: DC
  Administered 2012-07-21 – 2012-07-22 (×3): 25 mg via ORAL
  Filled 2012-07-20 (×5): qty 1

## 2012-07-20 MED ORDER — DEXTROSE 10 % IV SOLN
INTRAVENOUS | Status: DC
  Administered 2012-07-20: 08:00:00 via INTRAVENOUS

## 2012-07-20 MED ORDER — ESCITALOPRAM OXALATE 10 MG PO TABS
10.0000 mg | ORAL_TABLET | Freq: Every day | ORAL | Status: DC
  Administered 2012-07-21 – 2012-07-22 (×2): 10 mg via ORAL
  Filled 2012-07-20 (×5): qty 1

## 2012-07-20 MED ORDER — ENSURE COMPLETE PO LIQD
237.0000 mL | Freq: Two times a day (BID) | ORAL | Status: DC
  Administered 2012-07-20 – 2012-07-22 (×4): 237 mL via ORAL

## 2012-07-20 MED ORDER — INSULIN ASPART 100 UNIT/ML ~~LOC~~ SOLN: 4.0000 [IU] | Freq: Once | SUBCUTANEOUS | Status: DC

## 2012-07-20 MED ORDER — DEXTROSE 50 % IV SOLN
25.0000 mL | Freq: Once | INTRAVENOUS | Status: AC | PRN
  Filled 2012-07-20: qty 50

## 2012-07-20 MED ORDER — ENSURE PUDDING PO PUDG
1.0000 | Freq: Two times a day (BID) | ORAL | Status: DC
  Administered 2012-07-20 – 2012-07-22 (×3): 1 via ORAL

## 2012-07-20 MED ORDER — GLUCAGON HCL (RDNA) 1 MG IJ SOLR
INTRAMUSCULAR | Status: AC
  Filled 2012-07-20: qty 1

## 2012-07-20 MED ORDER — GLUCOSE-VITAMIN C 4-6 GM-MG PO CHEW
4.0000 | CHEWABLE_TABLET | ORAL | Status: DC | PRN
  Administered 2012-07-20: 4 via ORAL
  Filled 2012-07-20: qty 4

## 2012-07-20 MED ORDER — TECHNETIUM TO 99M ALBUMIN AGGREGATED
3.0000 | Freq: Once | INTRAVENOUS | Status: AC | PRN
  Administered 2012-07-20: 3 via INTRAVENOUS

## 2012-07-20 NOTE — Progress Notes (Signed)
INITIAL ADULT NUTRITION ASSESSMENT Date: 07/20/2012   Time: 11:40 AM  Reason for Assessment: Consult regarding poor PO intake  INTERVENTION:  Ensure Complete PO BID, each supplement provides 350 kcal and 13 grams of protein.  Ensure Pudding PO BID, each supplement provides 170 kcal and 4 grams of protein.    DOCUMENTATION CODES Per approved criteria  -Not Applicable   ASSESSMENT: Female 56 y.o.  Dx: Right leg pain  Hx:  Past Medical History  Diagnosis Date  . Seizures   . Hypertension   . Stroke     2007 - R sided weakness  . Asthma   . Diabetes mellitus   . 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 Thyroid disease   . LV dysfunction     Per brother, EF 17% s/p AICD 2007 ([employer]. Jude). No hx CAD.    Past Surgical History  Procedure Date  . Cholecystectomy   . Appendectomy   . Pacemaker insertion     Related Meds:  Scheduled Meds:   . acetaminophen  1,000 mg Oral Once  . dextrose      . digoxin  0.125 mg Oral Daily  . enoxaparin  60 mg Subcutaneous Q12H  . escitalopram  10 mg Oral Daily  . HYDROmorphone      . insulin aspart  0-9 Units Subcutaneous TID WC  . levETIRAcetam  1,000 mg Oral BID  . levothyroxine  50 mcg Oral Daily  . metoprolol tartrate  25 mg Oral Q12H  . pantoprazole  40 mg Oral Daily  . QUEtiapine  50 mg Oral q1800  . sodium chloride  3 mL Intravenous Q12H  . sodium chloride  3 mL Intravenous Q12H  . DISCONTD: levETIRAcetam  500 mg Oral QID  . DISCONTD: levothyroxine  75 mcg Oral Daily  . DISCONTD: metoprolol tartrate  12.5 mg Oral BID  . DISCONTD: QUEtiapine  50 mg Oral Daily  . DISCONTD: spironolactone  25 mg Oral Daily  . DISCONTD: terazosin  4 mg Oral QHS   Continuous Infusions:   . dextrose 30 mL/hr at 07/20/12 0816  . DISCONTD: sodium chloride 100 mL/hr at 07/19/12 0109   PRN Meds:.sodium chloride, acetaminophen, acetaminophen, albuterol, ALPRAZolam, dextrose, dextrose, dextrose, dextrose, glucose-Vitamin C, LORazepam,  ondansetron (ZOFRAN) IV, ondansetron, oxyCODONE, sodium chloride, technetium albumin aggregated, DISCONTD: heparin 6000 unit irrigation, DISCONTD:  HYDROmorphone (DILAUDID) injection, DISCONTD: iohexol, DISCONTD: lidocaine-EPINEPHrine, DISCONTD: meperidine (DEMEROL) injection, DISCONTD: ondansetron (ZOFRAN) IV DISCONTD: oxyCODONE, DISCONTD: oxyCODONE, DISCONTD: sodium chloride irrigation   Ht: 5\' 2"  (157.5 cm)  Wt: 131 lb 6.3 oz (59.6 kg)  Ideal Wt: 50 kg % Ideal Wt: 119%  Wt Readings from Last 10 Encounters:  07/20/12 131 lb 6.3 oz (59.6 kg)  07/20/12 131 lb 6.3 oz (59.6 kg)  04/24/12 157 lb 11.2 oz (71.532 kg)   Usual Wt: 157 lb % Usual Wt: 83%  Body mass index is 24.03 kg/(m^2).  Food/Nutrition Related Hx: 17% weight loss in the past 3 months  Labs:  CMP     Component Value Date/Time   NA 138 07/20/2012 0534   K 4.7 07/20/2012 0534   CL 107 07/20/2012 0534   CO2 17* 07/20/2012 0534   GLUCOSE 35* 07/20/2012 0534   BUN 18 07/20/2012 0534   CREATININE 0.93 07/20/2012 0534   CALCIUM 8.8 07/20/2012 0534   PROT 6.9 07/19/2012 0630   ALBUMIN 3.1* 07/19/2012 0630   AST 22 07/19/2012 0630   ALT 12 07/19/2012 0630   ALKPHOS 73  07/19/2012 0630   BILITOT 2.0* 07/19/2012 0630   GFRNONAA 68* 07/20/2012 0534   GFRAA 79* 07/20/2012 0534    CBG (last 3)   Basename 07/20/12 1100 07/20/12 0828 07/20/12 0805  GLUCAP 167* 92 68*     Intake/Output Summary (Last 24 hours) at 07/20/12 1142 Last data filed at 07/20/12 0901  Gross per 24 hour  Intake    653 ml  Output     10 ml  Net    643 ml    Diet Order: CHO-modified medium calorie   IVF:    dextrose Last Rate: 30 mL/hr at 07/20/12 7846  DISCONTD: sodium chloride Last Rate: 100 mL/hr at 07/19/12 0109    Estimated Nutritional Needs:   Kcal: 1500-1700 Protein: 70-80 gm Fluid: 1.5-1.7 liters  Patient unable to provide any nutrition history; she just moaned when RD asked her questions.  Patient is at nutrition risk given recent 17%  weight loss in 3 months.  Ensure Pudding cup was sitting at bedside, patient had consumed 100%.  Lunch meal at bedside, untouched.    NUTRITION DIAGNOSIS: Inadequate oral intake related to poor appetite as evidenced by poor intake of meals.  MONITORING/EVALUATION(Goals): Goal:  Intake to meet >90% of estimated nutrition needs. Monitor:  PO intake, weight trend, labs.  EDUCATION NEEDS: -Education not appropriate at this time   Joaquin Courts, RD, LDN, CNSC Pager# [phone] After Hours Pager# [phone]  07/20/2012, 11:40 AM

## 2012-07-20 NOTE — Progress Notes (Signed)
Pt c/o severe pain in head but refusing pain medications or any other interventions. Pt continuously yelling "it hurts" while holding her forehead. Notified MD. Will continue to monitor pt closely.   Juliane Lack, RN

## 2012-07-20 NOTE — Progress Notes (Signed)
Upon assessment, pt stated she felt "bad." Checked CBG which was 56. Oral glucose given per protocol. Rechecked 15 min later at 0815 and BS was 68. Oral glucose tablets given per protocol. At 0830, blood sugar was 92. Will continue to monitor pt closely.  Juliane Lack, RN

## 2012-07-20 NOTE — Progress Notes (Signed)
CRITICAL VALUE ALERT  Critical value received:  Blood Glucose=35  Date of notification:  07/20/12  Time of notification:  0730  Critical value read back:yes  Nurse who received alert:  Juliane Lack, RN  MD notified (1st page):  Dr Tat  Time of first page:  0740  MD notified (2nd page):  Time of second page:  Responding MD:  Dr Arbutus Leas  Time MD responded:  [phone]

## 2012-07-20 NOTE — Progress Notes (Addendum)
Hypoglycemic Event  CBG: 56  Treatment: 1 tube instant glucose  Symptoms: Shaky and Nervous/irritable  Follow-up CBG: Time:0815 CBG Result:68  Possible Reasons for Event: Inadequate meal intake  Comments/MD notified: 4 glucose tablets given at 0815 due to blood sugar still <70. At 0830 blood sugar was rechecked and was 92. MD made aware. Pt was started on D10 IV @30mL /hr. Also, MD ordered Q2 CBG checks.    Radene Ou  Remember to initiate Hypoglycemia Order Set & complete

## 2012-07-20 NOTE — Progress Notes (Signed)
Hypoglycemic Event  CBG: 28  Treatment: D50 IV 50 mL  Symptoms: Shaky  Follow-up CBG: Time:0540 CBG Result:108  Possible Reasons for Event: Unknown  Comments/MD notified:Elray Mcgregor    Terrell Shimko, Mortimer Fries  Remember to initiate Hypoglycemia Order Set & complete

## 2012-07-20 NOTE — Progress Notes (Signed)
Patient ID: Elizabeth Love, female   DOB: Jan 29, 1956, 56 y.o.   MRN: [id number] No complaints. Right groin puncture without hematoma. KUB post op shows good IVC filter placement Call if we can assist

## 2012-07-20 NOTE — Progress Notes (Signed)
[facility] PROGRESS NOTE  Elizabeth Love WUJ:[id number] DOB: [birth year] DOA: 07/18/2012 PCP: No primary provider on file.  Assessment/Plan: Right lower extremity DVT  -Right popliteal and right posterior tibial vein -Continue Lovenox for now  -Appreciate hematology recommendations -Appreciate vascular surgery -Postoperative day #1 IVC filter -Pulmonary perfusion scan negative Coagulopathy  -Concerned about smoldering DIC due to decrease fibrinogen and elevated PT/PTT  -Cannot rule out underlying autoimmune disease particularly lupus anticoagulant  -Consult hematology/oncology in the face of possible new DVT with coagulopathy  -Suspect patient may have underlying cirrhosis given chronic hepatic congestion from low perfusion due to cardiomyopathy  -Await results of 24-hour urine for protein -Check spot urine protein creatinine ratio to correlate with 24-hour urine -Awaiting protein C, protein S, factor V Leiden, lupus anticoagulant, prothrombin gene mutation Atrial fibrillation with RVR  -Titrate metoprolol back to home dose -Calcium channel blocker not a great option due to the patient's cardiomyopathy  -Free T4 -Continue digoxin Cardiomyopathy  -Chronic systolic CHF  -Patient appears euvolemic and compensated at this time  - Ejection fraction 10% status post AICD  -Low blood pressure has precluded use of ACE inhibitor  -brother states pt did not have MI other family are poor historians History of stroke 2007  -Residual right hemiparesis stable  -only takes ASA at home  -patient and brother are poor historians  Abdominal pain  -Suspect constipation  -This has been chronic  -Check 2-view abdomen  Diabetes mellitus type 2  -Patient has been hypoglycemic--> check morning cortisol level -Start patient on D10W @30cc /hr -Hemoglobin A1c 7.1  -Continue NovoLog sliding scale  -Hold oral hypoglycemics  Metabolic acidosis  -Check ammonia, lactic acid, salicylate level  unremarkable Former Tobacco user  -quit 2007  -8 pk yrs      Procedures/Studies: Dg Chest 1 View  07/20/2012  *RADIOLOGY REPORT*  Clinical Data: Congestive heart failure.  Shortness breath. Right leg swelling and pain.  CHEST - 1 VIEW  Comparison: 07/18/2012  Findings: Severe cardiomegaly is stable.  AICD remains in stable position.  Pulmonary vascular congestion and mild interstitial edema pattern show no significant change.  IMPRESSION: Stable cardiomegaly and mild diffuse interstitial edema, consistent with congestive heart failure.   Original Report Authenticated By: Myles Rosenthal, M.D.    Dg Chest 1 View  07/18/2012  *RADIOLOGY REPORT*  Clinical Data: Right lower extremity pain and swelling.  CHEST - 1 VIEW  Comparison: Chest 05/02/2012 and CT chest 04/23/2012.  Findings: AICD is in place.  There is massive cardiomegaly and mild interstitial edema.  Left basilar atelectasis is noted.  No pneumothorax.  IMPRESSION: Massive cardiomegaly and mild interstitial edema.   Original Report Authenticated By: Bernadene Bell. D'ALESSIO, M.D.    Dg Tibia/fibula Right  07/18/2012  *RADIOLOGY REPORT*  Clinical Data: Swelling and pain.  No known injury.  RIGHT TIBIA AND FIBULA - 2 VIEW  Comparison: None.  Findings: There is diffuse soft tissue swelling.  No soft tissue gas collection or radiopaque foreign body is identified.  There is no fracture or dislocation.  No bony destructive change is noted. Bones appear osteopenic.  IMPRESSION:  1.  Diffuse soft tissue swelling without underlying focal bony or joint abnormality. 2.  Osteopenia.   Original Report Authenticated By: Bernadene Bell. D'ALESSIO, M.D.    Dg Ankle Complete Right  07/18/2012  *RADIOLOGY REPORT*  Clinical Data: Diffuse soft tissue swelling.  No known injury.  RIGHT ANKLE - COMPLETE 3+ VIEW  Comparison: None.  Findings: Diffuse and marked soft tissue swelling  is identified. No soft tissue gas collection or radiopaque foreign body is identified.  There is  no fracture or dislocation.  Bones appear osteopenic.  No bony destructive change.  IMPRESSION:  1.  Diffuse soft tissue swelling without underlying focal bony abnormality. 2.  Osteopenia.   Original Report Authenticated By: Bernadene Bell. D'ALESSIO, M.D.    Nm Pulmonary Perfusion  07/20/2012  *RADIOLOGY REPORT*  Clinical Data:  Shortness of breath.  Lower extremity DVT.  NUCLEAR MEDICINE PERFUSION LUNG SCAN  Technique:  Perfusion images were obtained in multiple projections after intravenous injection of radiopharmaceutical.  Radiopharmaceutical: D 2.5 mCi Tc-42m MAA.  Comparison:  Chest radiograph also obtained today  Findings: Perfusion images show no segmental pulmonary perfusion defects in either lung.  Non segmental defects seen due to massive cardiomegaly and enlarged central pulmonary arteries.  IMPRESSION: No segmental pulmonary perfusion defects to suggest pulmonary embolism.   Original Report Authenticated By: Myles Rosenthal, M.D.    Dg Abd 2 Views  07/19/2012  *RADIOLOGY REPORT*  Clinical Data: Abdominal pain.  ABDOMEN - 2 VIEW  Comparison: 08/29/2009  Findings: Supine and left lateral decubitus images of the abdomen were obtained.  There is no evidence for free air.  Surgical clips in the right upper abdomen.  There is a nonobstructive bowel gas pattern.  Multiple calcifications in the pelvis are suggestive for phleboliths.  The bony structures are intact.  IMPRESSION: Nonspecific bowel gas pattern.   Original Report Authenticated By: Richarda Overlie, M.D.    Dg Abd Portable 1v  07/19/2012  *RADIOLOGY REPORT*  Clinical Data: Vena cava filter placement  PORTABLE ABDOMEN - 1 VIEW  Comparison: None.  Findings: There is a filter in the inferior vena cava region.  The apex of the filter is at the L1-2 level.  There are clips in the gallbladder fossa region.  The bowel gas pattern is normal.  No obstruction or free air is seen on this supine examination.  There is contrast in the urinary bladder.  There are  phleboliths in the pelvis.  IMPRESSION: Filter apex is at the L1-2 level in the region of the inferior vena cava.  Bowel gas pattern within normal limits.   Original Report Authenticated By: Bretta Bang, M.D.    Dg C-arm 1-60 Min-no Report  07/19/2012  CLINICAL DATA: Insertion IVC Filter   C-ARM 1-60 MINUTES  Fluoroscopy was utilized by the requesting physician.  No radiographic  interpretation.           Subjective: Patient denies any fevers, chills, chest pain, shortness breath, nausea, vomiting, diarrhea, abdominal pain, dizziness.  Objective: Filed Vitals:   07/19/12 2051 07/19/12 2254 07/20/12 0647 07/20/12 1100  BP: 109/70 90/64 128/81 109/78  Pulse: 95 85 85 122  Temp:   98.4 F (36.9 C) 97 F (36.1 C)  TempSrc:   Oral Oral  Resp:   18 18  Height:      Weight:   59.6 kg (131 lb 6.3 oz)   SpO2:   100% 100%    Intake/Output Summary (Last 24 hours) at 07/20/12 1206 Last data filed at 07/20/12 1145  Gross per 24 hour  Intake    653 ml  Output    210 ml  Net    443 ml   Weight change: 1.7 kg (3 lb 12 oz) Exam:   General:  Pt is alert, follows commands appropriately, not in acute distress  HEENT: No icterus, No thrush, No neck mass, [place]/AT  Cardiovascular: IRRR, S1/S2, no rubs,  no gallops  Respiratory: CTA bilaterally, no wheezing, no crackles, no rhonchi  Abdomen: Soft/+BS, non tender, non distended, no guarding  Extremities: 2+ edema right lower extremity, No lymphangitis, No petechiae, No rashes, no synovitis  Data Reviewed: Basic Metabolic Panel:  Lab 07/20/12 1610 07/19/12 0630 07/18/12 0237  NA 138 138 138  K 4.7 4.1 3.5  CL 107 103 103  CO2 17* 17* 19  GLUCOSE 35* 160* 126*  BUN 18 20 15   CREATININE 0.93 1.19* 0.90  CALCIUM 8.8 9.2 9.2  MG -- -- --  PHOS -- -- --   Liver Function Tests:  Lab 07/19/12 0630 07/18/12 0240  AST 22 33  ALT 12 13  ALKPHOS 73 71  BILITOT 2.0* 1.2  PROT 6.9 7.4  ALBUMIN 3.1* 3.4*   No results found for  this basename: LIPASE:5,AMYLASE:5 in the last 168 hours  Lab 07/19/12 1015  AMMONIA 38   CBC:  Lab 07/20/12 0534 07/19/12 0630 07/18/12 2004 07/18/12 0237  WBC 6.0 6.2 -- 5.6  NEUTROABS -- -- -- 3.9  HGB 8.9* 9.6* -- 9.6*  HCT 28.6* 31.1* -- 29.1*  MCV 77.3* 78.5 -- 74.4*  PLT 176 186 143* 179   Cardiac Enzymes:  Lab 07/18/12 0240  CKTOTAL --  CKMB --  CKMBINDEX --  TROPONINI <0.30   BNP: No components found with this basename: POCBNP:5 CBG:  Lab 07/20/12 1100 07/20/12 0828 07/20/12 0805 07/20/12 0738 07/20/12 0542  GLUCAP 167* 92 68* 56* 108*    No results found for this or any previous visit (from the past 240 hour(s)).   Scheduled Meds:   . acetaminophen  1,000 mg Oral Once  . dextrose      . digoxin  0.125 mg Oral Daily  . enoxaparin  60 mg Subcutaneous Q12H  . escitalopram  10 mg Oral Daily  . HYDROmorphone      . insulin aspart  0-9 Units Subcutaneous TID WC  . levETIRAcetam  1,000 mg Oral BID  . levothyroxine  50 mcg Oral Daily  . metoprolol tartrate  25 mg Oral Q12H  . pantoprazole  40 mg Oral Daily  . QUEtiapine  50 mg Oral q1800  . sodium chloride  3 mL Intravenous Q12H  . sodium chloride  3 mL Intravenous Q12H  . DISCONTD: levETIRAcetam  500 mg Oral QID  . DISCONTD: levothyroxine  75 mcg Oral Daily  . DISCONTD: metoprolol tartrate  12.5 mg Oral BID  . DISCONTD: QUEtiapine  50 mg Oral Daily  . DISCONTD: spironolactone  25 mg Oral Daily  . DISCONTD: terazosin  4 mg Oral QHS   Continuous Infusions:   . dextrose 30 mL/hr at 07/20/12 0816  . DISCONTD: sodium chloride 100 mL/hr at 07/19/12 0109     Jabaree Mercado, DO  [facility] Pager [phone]  If 7PM-7AM, please contact night-coverage [email or web address] Password [email or web address] 07/20/2012, 12:06 PM   LOS: 2 days

## 2012-07-20 NOTE — Progress Notes (Signed)
Pt had 27 beats of V-tach, pt asleep but arousable, BG checked and reading 28. D50 IV given, tolerated well. BG rechecked in 15 minutes and reading 108. Pt alert and communicating at this time. Elray Mcgregor notified, no new orders given, will continue to monitor.

## 2012-07-20 NOTE — Progress Notes (Signed)
Pt had 4 beat run of V-tach. Pt asymptomatic and resting quietly in bed. Will continue to monitor pt closely.  Juliane Lack, RN

## 2012-07-21 LAB — CBC
HCT: 27.7 % — ABNORMAL LOW (ref 36.0–46.0)
Hemoglobin: 8.8 g/dL — ABNORMAL LOW (ref 12.0–15.0)
RBC: 3.58 MIL/uL — ABNORMAL LOW (ref 3.87–5.11)
WBC: 5.8 10*3/uL (ref 4.0–10.5)

## 2012-07-21 LAB — BASIC METABOLIC PANEL
CO2: 19 mEq/L (ref 19–32)
Calcium: 8.7 mg/dL (ref 8.4–10.5)
Chloride: 107 mEq/L (ref 96–112)
Glucose, Bld: 140 mg/dL — ABNORMAL HIGH (ref 70–99)
Potassium: 3.5 mEq/L (ref 3.5–5.1)
Sodium: 137 mEq/L (ref 135–145)

## 2012-07-21 LAB — GLUCOSE, CAPILLARY
Glucose-Capillary: 145 mg/dL — ABNORMAL HIGH (ref 70–99)
Glucose-Capillary: 184 mg/dL — ABNORMAL HIGH (ref 70–99)
Glucose-Capillary: 144 mg/dL — ABNORMAL HIGH (ref 70–99)
Glucose-Capillary: 152 mg/dL — ABNORMAL HIGH (ref 70–99)

## 2012-07-21 LAB — URINE CULTURE: Colony Count: NO GROWTH

## 2012-07-21 LAB — MAGNESIUM: Magnesium: 1.8 mg/dL (ref 1.5–2.5)

## 2012-07-21 LAB — PROTEIN, URINE, 24 HOUR
Collection Interval-UPROT: 24 hours
Protein, Urine: 118 mg/dL

## 2012-07-21 LAB — CORTISOL-AM, BLOOD: Cortisol - AM: 17.2 ug/dL (ref 4.3–22.4)

## 2012-07-21 MED ORDER — FOLIC ACID 1 MG PO TABS
2.0000 mg | ORAL_TABLET | Freq: Every day | ORAL | Status: DC
  Administered 2012-07-21 – 2012-07-22 (×2): 2 mg via ORAL
  Filled 2012-07-21 (×2): qty 2

## 2012-07-21 NOTE — Progress Notes (Signed)
ANTICOAGULATION CONSULT NOTE - Follow Up Consult  Pharmacy Consult for Lovenox Indication: DVT  Allergies  Allergen Reactions  . Penicillins Rash  . Sulfa Antibiotics Rash  Patient Measurements: Height: 5\' 2"  (157.5 cm) Weight: 132 lb 4.4 oz (60 kg) IBW/kg (Calculated) : 50.1  Vital Signs: Temp: 98.7 F (37.1 C) (11/03 0441) Temp src: Oral (11/03 0441) BP: 114/82 mmHg (11/03 1008) Pulse Rate: 107  (11/03 1008) Labs:  Basename 07/21/12 0655 07/20/12 0534 07/19/12 0630 07/18/12 2004  HGB 8.8* 8.9* -- --  HCT 27.7* 28.6* 31.1* --  PLT 175 176 186 --  APTT -- -- 43* 44*  LABPROT -- -- 26.5* 25.4*  INR -- -- 2.59* 2.44*  HEPARINUNFRC -- -- -- --  CREATININE 0.78 0.93 1.19* --  CKTOTAL -- -- -- --  CKMB -- -- -- --  TROPONINI -- -- -- --    Estimated Creatinine Clearance: 63.6 ml/min (by C-G formula based on Cr of 0.78).  Assessment: 37 YOF with new bilateral DVT s/p placement of IVC filter on 11/1 and remains on full dose Lovenox. VQ scan is NOT suggestive of PE. Per Dr. Arbutus Leas who discussed with Dr. Myna Hidalgo (Heme/Onc) ok to continue Lovenox despite elevated INR. Patient was not on anticoagulation prior to admission. Coagulation studies are pending to determine reason for hyper-coagulabiilty. H/H is trending down. Platelets are stable. No bleeding reported. Creatinine remains stable.   Goal of Therapy:  Anti-Xa level 0.6-1.2 units/ml 4hrs after LMWH dose given if needed   Plan:  Continue Lovenox at 60mg  SQ BID.  Will continue to follow coag studies, renal function, and CBC.   Fayne Norrie 07/21/2012,1:17 PM

## 2012-07-21 NOTE — Progress Notes (Signed)
[facility] PROGRESS NOTE  Elizabeth Love ZOX:[id number] DOB: [birth year] DOA: 07/18/2012 PCP: No primary provider on file.  Assessment/Plan: Right lower extremity DVT  -Right popliteal and right posterior tibial vein  -Continue Lovenox for now-->will contact heme/onc regarding choice of long term anticoagulation -Appreciate vascular surgery  -Postoperative day #2 IVC filter  -Pulmonary perfusion scan negative  Coagulopathy  -Concerned about smoldering DIC due to decrease fibrinogen and elevated PT/PTT  -Cannot rule out underlying autoimmune disease particularly lupus anticoagulant  -Consult hematology/oncology in the face of possible new DVT with coagulopathy  -Suspect patient may have underlying cirrhosis given chronic hepatic congestion from low perfusion due to cardiomyopathy  -urine protein/creatinine ratio does not suggest nephrotic range proteinuria -Awaiting protein C, protein S, factor V Leiden, lupus anticoagulant, prothrombin gene mutation  Atrial fibrillation with RVR  -Titrate metoprolol back to home dose  -Calcium channel blocker not a great option due to the patient's cardiomyopathy  -Free T4  -Continue digoxin  Cardiomyopathy  -Chronic systolic CHF  -Patient appears euvolemic and compensated at this time  - Ejection fraction 10% status post AICD  -Low blood pressure has precluded use of ACE inhibitor  -brother states pt did not have MI; other family are poor historians  History of stroke 2007  -Residual right hemiparesis stable  -only takes ASA at home  -patient and brother are poor historians  Abdominal pain  -Suspect constipation  -This has been chronic  -2-view abdomen-->nonspecific bowel gas  Diabetes mellitus type 2  -Hypoglycemia improved -DC D10W and continue Accu-Cheks -Hemoglobin A1c 7.1  -Continue NovoLog sliding scale  -Hold oral hypoglycemics  Metabolic acidosis  -ammonia, lactic acid, salicylate level unremarkable  -improving Former  Tobacco user  -quit 2007  -8 pk yrs     Disposition Plan:   Home 07/22/12 if okay with heme/onc     Procedures/Studies: Dg Chest 1 View  07/20/2012  *RADIOLOGY REPORT*  Clinical Data: Congestive heart failure.  Shortness breath. Right leg swelling and pain.  CHEST - 1 VIEW  Comparison: 07/18/2012  Findings: Severe cardiomegaly is stable.  AICD remains in stable position.  Pulmonary vascular congestion and mild interstitial edema pattern show no significant change.  IMPRESSION: Stable cardiomegaly and mild diffuse interstitial edema, consistent with congestive heart failure.   Original Report Authenticated By: Myles Rosenthal, M.D.    Dg Chest 1 View  07/18/2012  *RADIOLOGY REPORT*  Clinical Data: Right lower extremity pain and swelling.  CHEST - 1 VIEW  Comparison: Chest 05/02/2012 and CT chest 04/23/2012.  Findings: AICD is in place.  There is massive cardiomegaly and mild interstitial edema.  Left basilar atelectasis is noted.  No pneumothorax.  IMPRESSION: Massive cardiomegaly and mild interstitial edema.   Original Report Authenticated By: Bernadene Bell. D'ALESSIO, M.D.    Dg Tibia/fibula Right  07/18/2012  *RADIOLOGY REPORT*  Clinical Data: Swelling and pain.  No known injury.  RIGHT TIBIA AND FIBULA - 2 VIEW  Comparison: None.  Findings: There is diffuse soft tissue swelling.  No soft tissue gas collection or radiopaque foreign body is identified.  There is no fracture or dislocation.  No bony destructive change is noted. Bones appear osteopenic.  IMPRESSION:  1.  Diffuse soft tissue swelling without underlying focal bony or joint abnormality. 2.  Osteopenia.   Original Report Authenticated By: Bernadene Bell. D'ALESSIO, M.D.    Dg Ankle Complete Right  07/18/2012  *RADIOLOGY REPORT*  Clinical Data: Diffuse soft tissue swelling.  No known injury.  RIGHT ANKLE - COMPLETE  3+ VIEW  Comparison: None.  Findings: Diffuse and marked soft tissue swelling is identified. No soft tissue gas collection or  radiopaque foreign body is identified.  There is no fracture or dislocation.  Bones appear osteopenic.  No bony destructive change.  IMPRESSION:  1.  Diffuse soft tissue swelling without underlying focal bony abnormality. 2.  Osteopenia.   Original Report Authenticated By: Bernadene Bell. D'ALESSIO, M.D.    Nm Pulmonary Perfusion  07/20/2012  *RADIOLOGY REPORT*  Clinical Data:  Shortness of breath.  Lower extremity DVT.  NUCLEAR MEDICINE PERFUSION LUNG SCAN  Technique:  Perfusion images were obtained in multiple projections after intravenous injection of radiopharmaceutical.  Radiopharmaceutical: D 2.5 mCi Tc-5m MAA.  Comparison:  Chest radiograph also obtained today  Findings: Perfusion images show no segmental pulmonary perfusion defects in either lung.  Non segmental defects seen due to massive cardiomegaly and enlarged central pulmonary arteries.  IMPRESSION: No segmental pulmonary perfusion defects to suggest pulmonary embolism.   Original Report Authenticated By: Myles Rosenthal, M.D.    Dg Abd 2 Views  07/19/2012  *RADIOLOGY REPORT*  Clinical Data: Abdominal pain.  ABDOMEN - 2 VIEW  Comparison: 08/29/2009  Findings: Supine and left lateral decubitus images of the abdomen were obtained.  There is no evidence for free air.  Surgical clips in the right upper abdomen.  There is a nonobstructive bowel gas pattern.  Multiple calcifications in the pelvis are suggestive for phleboliths.  The bony structures are intact.  IMPRESSION: Nonspecific bowel gas pattern.   Original Report Authenticated By: Richarda Overlie, M.D.    Dg Abd Portable 1v  07/19/2012  *RADIOLOGY REPORT*  Clinical Data: Vena cava filter placement  PORTABLE ABDOMEN - 1 VIEW  Comparison: None.  Findings: There is a filter in the inferior vena cava region.  The apex of the filter is at the L1-2 level.  There are clips in the gallbladder fossa region.  The bowel gas pattern is normal.  No obstruction or free air is seen on this supine examination.  There is  contrast in the urinary bladder.  There are phleboliths in the pelvis.  IMPRESSION: Filter apex is at the L1-2 level in the region of the inferior vena cava.  Bowel gas pattern within normal limits.   Original Report Authenticated By: Bretta Bang, M.D.    Dg C-arm 1-60 Min-no Report  07/19/2012  CLINICAL DATA: Insertion IVC Filter   C-ARM 1-60 MINUTES  Fluoroscopy was utilized by the requesting physician.  No radiographic  interpretation.           Subjective: Patient continues to intermittently refuse medications. However she does redirect when spoken to. She denies any fevers, chills, chest pain, shortness breath, vomiting, diarrhea, abdominal pain. She complains of some constipation  Objective: Filed Vitals:   07/20/12 2038 07/21/12 0441 07/21/12 1006 07/21/12 1008  BP: 122/78 128/79  114/82  Pulse: 108 104 110 107  Temp: 98.3 F (36.8 C) 98.7 F (37.1 C)    TempSrc: Oral Oral    Resp: 19 20    Height:      Weight:  60 kg (132 lb 4.4 oz)    SpO2: 98% 98%      Intake/Output Summary (Last 24 hours) at 07/21/12 1401 Last data filed at 07/21/12 1012  Gross per 24 hour  Intake   1255 ml  Output    200 ml  Net   1055 ml   Weight change: 0.4 kg (14.1 oz) Exam:   General:  Pt  is alert, follows commands appropriately, not in acute distress  HEENT: No icterus, No thrush,  [place]/AT  Cardiovascular: RRR, S1/S2,  Respiratory: CTA bilaterally, no wheezing, no crackles, no rhonchi  Abdomen: Soft/+BS, non tender, non distended, no guarding  Extremities: 1+ edema R>L, No lymphangitis, No petechiae, No rashes, no synovitis  Data Reviewed: Basic Metabolic Panel:  Lab 07/21/12 3244 07/20/12 0534 07/19/12 0630 07/18/12 0237  NA 137 138 138 138  K 3.5 4.7 4.1 3.5  CL 107 107 103 103  CO2 19 17* 17* 19  GLUCOSE 140* 35* 160* 126*  BUN 12 18 20 15   CREATININE 0.78 0.93 1.19* 0.90  CALCIUM 8.7 8.8 9.2 9.2  MG 1.8 -- -- --  PHOS -- -- -- --   Liver Function Tests:  Lab  07/19/12 0630 07/18/12 0240  AST 22 33  ALT 12 13  ALKPHOS 73 71  BILITOT 2.0* 1.2  PROT 6.9 7.4  ALBUMIN 3.1* 3.4*   No results found for this basename: LIPASE:5,AMYLASE:5 in the last 168 hours  Lab 07/19/12 1015  AMMONIA 38   CBC:  Lab 07/21/12 0655 07/20/12 0534 07/19/12 0630 07/18/12 2004 07/18/12 0237  WBC 5.8 6.0 6.2 -- 5.6  NEUTROABS -- -- -- -- 3.9  HGB 8.8* 8.9* 9.6* -- 9.6*  HCT 27.7* 28.6* 31.1* -- 29.1*  MCV 77.4* 77.3* 78.5 -- 74.4*  PLT 175 176 186 143* 179   Cardiac Enzymes:  Lab 07/18/12 0240  CKTOTAL --  CKMB --  CKMBINDEX --  TROPONINI <0.30   BNP: No components found with this basename: POCBNP:5 CBG:  Lab 07/21/12 1225 07/21/12 1018 07/21/12 0837 07/21/12 0609 07/21/12 0421  GLUCAP 229* 150* 184* 147* 118*    Recent Results (from the past 240 hour(s))  CULTURE, BLOOD (ROUTINE X 2)     Status: Normal (Preliminary result)   Collection Time   07/19/12  2:10 PM      Component Value Range Status Comment   Specimen Description BLOOD RIGHT ARM   Final    Special Requests BOTTLES DRAWN AEROBIC AND ANAEROBIC 10CC   Final    Culture  Setup Time 07/19/2012 21:42   Final    Culture     Final    Value:        BLOOD CULTURE RECEIVED NO GROWTH TO DATE CULTURE WILL BE HELD FOR 5 DAYS BEFORE ISSUING A FINAL NEGATIVE REPORT   Report Status PENDING   Incomplete   CULTURE, BLOOD (ROUTINE X 2)     Status: Normal (Preliminary result)   Collection Time   07/19/12  2:15 PM      Component Value Range Status Comment   Specimen Description BLOOD RIGHT ARM   Final    Special Requests BOTTLES DRAWN AEROBIC ONLY 10CC   Final    Culture  Setup Time 07/19/2012 21:42   Final    Culture     Final    Value:        BLOOD CULTURE RECEIVED NO GROWTH TO DATE CULTURE WILL BE HELD FOR 5 DAYS BEFORE ISSUING A FINAL NEGATIVE REPORT   Report Status PENDING   Incomplete      Scheduled Meds:   . acetaminophen  1,000 mg Oral Once  . digoxin  0.125 mg Oral Daily  . enoxaparin  60  mg Subcutaneous Q12H  . escitalopram  10 mg Oral Daily  . feeding supplement  237 mL Oral BID BM  . feeding supplement  1 Container Oral BID BM  .  insulin aspart  4 Units Subcutaneous Once  . levETIRAcetam  1,000 mg Oral BID  . levothyroxine  50 mcg Oral Daily  . metoprolol tartrate  25 mg Oral Q12H  . pantoprazole  40 mg Oral Daily  . QUEtiapine  50 mg Oral q1800  . sodium chloride  3 mL Intravenous Q12H  . sodium chloride  3 mL Intravenous Q12H  . [DISCONTINUED] insulin aspart  0-9 Units Subcutaneous TID WC   Continuous Infusions:   . [DISCONTINUED] dextrose 30 mL/hr at 07/20/12 0816     Rune Mendez, DO  [facility] Pager [phone]  If 7PM-7AM, please contact night-coverage [email or web address] Password [email or web address] 07/21/2012, 2:01 PM   LOS: 3 days

## 2012-07-22 ENCOUNTER — Encounter (HOSPITAL_COMMUNITY): Payer: Self-pay | Admitting: Vascular Surgery

## 2012-07-22 ENCOUNTER — Telehealth: Payer: Self-pay | Admitting: Hematology & Oncology

## 2012-07-22 DIAGNOSIS — D649 Anemia, unspecified: Secondary | ICD-10-CM

## 2012-07-22 LAB — GLUCOSE, CAPILLARY
Glucose-Capillary: 115 mg/dL — ABNORMAL HIGH (ref 70–99)
Glucose-Capillary: 140 mg/dL — ABNORMAL HIGH (ref 70–99)

## 2012-07-22 LAB — IRON AND TIBC
Iron: 20 ug/dL — ABNORMAL LOW (ref 42–135)
UIBC: 398 ug/dL (ref 125–400)

## 2012-07-22 LAB — CBC
HCT: 29.5 % — ABNORMAL LOW (ref 36.0–46.0)
MCH: 23.9 pg — ABNORMAL LOW (ref 26.0–34.0)
MCHC: 30.8 g/dL (ref 30.0–36.0)
MCV: 77.6 fL — ABNORMAL LOW (ref 78.0–100.0)
RDW: 20.5 % — ABNORMAL HIGH (ref 11.5–15.5)

## 2012-07-22 LAB — LUPUS ANTICOAGULANT PANEL
DRVVT: 40 secs (ref <42.9)
Lupus Anticoagulant: NOT DETECTED

## 2012-07-22 LAB — ETHYLENE GLYCOL: Ethylene Glycol Lvl: <5

## 2012-07-22 LAB — BETA-2-GLYCOPROTEIN I ABS, IGG/M/A
Beta-2 Glyco I IgG: 2 G Units (ref <20)
Beta-2-Glycoprotein I IgM: 5 M Units (ref <20)

## 2012-07-22 LAB — PROTEIN C, TOTAL: Protein C, Total: 20 % — ABNORMAL LOW (ref 72–160)

## 2012-07-22 LAB — PROTEIN C ACTIVITY: Protein C Activity: 35 % — ABNORMAL LOW (ref 75–133)

## 2012-07-22 LAB — CARDIOLIPIN ANTIBODIES, IGG, IGM, IGA: Anticardiolipin IgA: 11 APL U/mL — ABNORMAL LOW (ref <22)

## 2012-07-22 MED ORDER — INSULIN ASPART 100 UNIT/ML ~~LOC~~ SOLN: 0.0000 [IU] | Freq: Every day | SUBCUTANEOUS | Status: DC

## 2012-07-22 MED ORDER — RIVAROXABAN 15 MG PO TABS: 15.0000 mg | ORAL_TABLET | Freq: Two times a day (BID) | ORAL | Status: DC

## 2012-07-22 MED ORDER — RIVAROXABAN 15 MG PO TABS
15.0000 mg | ORAL_TABLET | Freq: Two times a day (BID) | ORAL | Status: DC
  Administered 2012-07-22: 15 mg via ORAL
  Filled 2012-07-22 (×2): qty 1

## 2012-07-22 MED ORDER — RIVAROXABAN 20 MG PO TABS: 20.0000 mg | ORAL_TABLET | Freq: Every day | ORAL | Status: DC

## 2012-07-22 MED ORDER — INSULIN ASPART 100 UNIT/ML ~~LOC~~ SOLN
0.0000 [IU] | Freq: Three times a day (TID) | SUBCUTANEOUS | Status: DC
  Administered 2012-07-22: 1 [IU] via SUBCUTANEOUS

## 2012-07-22 MED ORDER — GLIPIZIDE ER 5 MG PO TB24: 5.0000 mg | ORAL_TABLET | Freq: Every day | ORAL | Status: DC

## 2012-07-22 NOTE — Telephone Encounter (Signed)
Troyce RN from [place] called scheduled 07-25-12 hospital follow up for pt. Pt is aware of appointment and has address and call back number. MD aware

## 2012-07-22 NOTE — Discharge Summary (Signed)
Physician Discharge Summary  YOLANDA HUFFSTETLER ZOX:[id number] DOB: Feb 10, 1956 DOA: 07/18/2012  PCP: No primary provider on file.  Admit date: 07/18/2012 Discharge date: 07/22/2012  Recommendations for Outpatient Follow-up:  1. Pt will need to follow up with PCP in 2 weeks post discharge 2. Follow up with Dr. Nanda Quinton will contact patient regarding appointment  Discharge Diagnoses:  Right lower extremity DVT  -Right popliteal and right posterior tibial vein  -Continue Lovenox for now-->will contact heme/onc regarding choice of long term anticoagulation  -Appreciate vascular surgery  -Postoperative day #2 IVC filter  -Pulmonary perfusion scan negative  Coagulopathy  -Concerned about smoldering DIC due to decrease fibrinogen and elevated PT/PTT  -Cannot rule out underlying autoimmune disease particularly lupus anticoagulant  -Consult hematology/oncology in the face of possible new DVT with coagulopathy  -Suspect patient may have underlying cirrhosis given chronic hepatic congestion from low perfusion due to cardiomyopathy  -urine protein/creatinine ratio does not suggest nephrotic range proteinuria  -Awaiting protein C, protein S, factor V Leiden, lupus anticoagulant, prothrombin gene mutation  Atrial fibrillation with RVR  -Titrate metoprolol back to home dose  -Calcium channel blocker not a great option due to the patient's cardiomyopathy  -Free T4  -Continue digoxin  Cardiomyopathy  -Chronic systolic CHF  -Patient appears euvolemic and compensated at this time  - Ejection fraction 10% status post AICD  -Low blood pressure has precluded use of ACE inhibitor  -brother states pt did not have MI; other family are poor historians  History of stroke 2007  -Residual right hemiparesis stable  -only takes ASA at home  -patient and brother are poor historians  Abdominal pain  -improving after BM -This has been chronic  -2-view abdomen-->nonspecific bowel gas  Diabetes mellitus  type 2  -Hypoglycemia improved  -Hemoglobin A1c 7.1  -Continue NovoLog sliding scale  -Hold oral hypoglycemics  Metabolic acidosis  -ammonia, lactic acid, salicylate level unremarkable  -improving  Former Tobacco user  -quit 2007  -8 pk yrs     Discharge Condition: Stable  Disposition:  discharge home  Diet: Cardiac Wt Readings from Last 3 Encounters:  07/22/12 61.3 kg (135 lb 2.3 oz)  07/22/12 61.3 kg (135 lb 2.3 oz)  04/24/12 71.532 kg (157 lb 11.2 oz)    History of present illness:  Patient is poor historian 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 Most of information is obtained from EMR, [facility] and brother. Patient has h/o atrial fibrillation (with no records that patient is on coumadin at home and patient is unsure), Chronic systolic HF with EF per brother of 15% s/p AICD 2007. Presents with 2 day complaint of RLE pain. Per brother patient has not had any recent prolonged travels. Has presented multiple times to ed for evaluation. Denies any trauma or recent falls. She has had a stroke which affected her right side and as such she has weakness in her Right extremities. Pain is worse with movement.  In ED was found to have elevated D dimer and placed on heparin gtt. We were contacted for further evaluation and admission orders.   Hospital Course:  Bilateral lower extremity ultrasounds revealed DVT in the right popliteal and right posterior tibial veins. The patient was started on Lovenox 1 mg per kilogram twice a day. VQ scan was obtained, and the perfusion scan was negative for pulmonary embolus. Dr. Arlan Organ was consulted due to the patient's coagulopathy. The patient was noted to have a prolonged INR 2.44 the time of admission. The patient was  not on any anticoagulation. The patient was also found to have a fibrinogen 159, PTT 44 and d-dimer 2.97. The peripheral smear did not reveal any schistocytes. A hypercoagulable workup was undertaken, but many of the  labs are still pending at the time of discharge. The patient will have a followup appointment at Dr. Gustavo Lah office for followup. The patient's homocysteine level was mildly elevated at 23.4. Her antithrombin III was 79. 24-hour urine as well as a urine protein/urine creatinine ratio was obtained which did not show just nephrotic syndrome. She only had approximately 300 mg of protein. Blood cultures and urine cultures were negative. Initially, the patient's lactic acid was found to be 4.2 which improved to 1.1 with fluid hydration. The patient did not have any bleeding complications during the hospitalization. Her hemoglobin remained stable. Dr. Myna Hidalgo recommended starting the patient on rivaroxaban. The patient will be discharged on rivaroxaban 50 mg twice a day x21 days then 20 mg daily with meals.  Dr. Arbie Cookey was consulted and placed a inferior vena cava filter 07/18/2012. The patient did not have any postoperative palpitations. The patient was found to have a gapped metabolic acidosis. This is likely due to her lactic acidosis. Urine toxicology was negative except for benzodiazepines and opiates. Her ammonia was 38. Echocardiogram was performed and showed the patient had an ejection fraction of 10% with mild pulmonary hypertension. The patient remained euvolemic. There was no signs of decompensation. The patient was resumed on her metoprolol tartrate 25 mg twice a day, digoxin 0.125 mg daily. The patient's ACE inhibitor was initially held due to her a marginally low blood pressure. However her blood pressure improved throughout the hospitalization. She will be discharged home back on her usual dose of lisinopril. The patient continued to remain in a fibrillation throughout the hospitalization. For the most part, the patient remained rate controlled. 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 The patient will need to follow up with  her primary care provider for recheck of her thyroid functions. The patient did have initial episodes of hypoglycemia. This may have been due to the patient's previous use of glipizide and poor oral intake. She was temporarily placed on a D10 drip. Morning cortisol was found to be 17.2. Her sugars gradually improved. She was discontinued off the drip, and her sugars were well-controlled. She will be instructed to remain off of her glipizide for now until she follows up with her primary care provider. The patient did have abdominal pain. This has been a chronic problem. Two-view abdominal x-ray was negative for any acute findings. After the patient had a bowel movement, her abdominal pain did improve.  Consultants: Dr. Myna Hidalgo  Discharge Exam: Filed Vitals:   07/22/12 0512  BP: 134/76  Pulse: 101  Temp: 97.6 F (36.4 C)  Resp: 20   Filed Vitals:   07/21/12 1008 07/21/12 1433 07/21/12 2153 07/22/12 0512  BP: 114/82 126/86 127/84 134/76  Pulse: 107 105 104 101  Temp:  97.3 F (36.3 C) 97.9 F (36.6 C) 97.6 F (36.4 C)  TempSrc:  Oral Oral Oral  Resp:  21 20 20   Height:      Weight:    61.3 kg (135 lb 2.3 oz)  SpO2:  100% 100% 97%   General: A&O x 3, NAD, pleasant, cooperative Cardiovascular: RRR, no rub, no gallop, no S3 Respiratory: CTAB, no wheeze, no rhonchi Abdomen:soft, nontender, nondistended, positive bowel sounds Extremities: 1+  edema right greater than left, No lymphangitis, no petechiae  Discharge Instructions      Discharge Orders    Future Orders Please Complete By Expires   Diet - low sodium heart healthy      Increase activity slowly      Discharge instructions      Comments:   Stop Hytrin Take rivaroxaban 15mg , one tablet twice a day x 21 days. After 21 days, take rivaroxaban 20mg  once daily with evening meal. Stop glipizide 10mg  Take glipizide 5mg  once daily       Medication List     As of 07/22/2012  9:44 AM    STOP taking these medications           terazosin 2 MG capsule   Commonly known as: HYTRIN      TAKE these medications         acetaminophen 650 MG CR tablet   Commonly known as: TYLENOL   Take 650 mg by mouth every 8 (eight) hours as needed. For pain      albuterol (2.5 MG/3ML) 0.083% nebulizer solution   Commonly known as: PROVENTIL   Take 2.5 mg by nebulization every 6 (six) hours as needed. For cough or wheeze      ALPRAZolam 0.25 MG tablet   Commonly known as: XANAX   Take 0.25 mg by mouth at bedtime as needed. For sleep      digoxin 0.125 MG tablet   Commonly known as: LANOXIN   Take 0.125 mg by mouth daily.      escitalopram 10 MG tablet   Commonly known as: LEXAPRO   Take 10 mg by mouth daily.      furosemide 40 MG tablet   Commonly known as: LASIX   Take 1 tablet (40 mg total) by mouth daily.      glipiZIDE 5 MG 24 hr tablet   Commonly known as: GLUCOTROL XL   Take 1 tablet (5 mg total) by mouth daily.      levETIRAcetam 1000 MG tablet   Commonly known as: KEPPRA   Take 1,000 mg by mouth 2 (two) times daily.      levothyroxine 50 MCG tablet   Commonly known as: SYNTHROID, LEVOTHROID   Take 50 mcg by mouth daily.      lisinopril 5 MG tablet   Commonly known as: PRINIVIL,ZESTRIL   Take 5 mg by mouth daily.      metoprolol tartrate 25 MG tablet   Commonly known as: LOPRESSOR   Take 25 mg by mouth 2 (two) times daily.      omeprazole 20 MG capsule   Commonly known as: PRILOSEC   Take 20 mg by mouth 2 (two) times daily.      oxyCODONE-acetaminophen 10-325 MG per tablet   Commonly known as: PERCOCET   Take 1 tablet by mouth 3 (three) times daily as needed for pain. For pain      Rivaroxaban 15 MG Tabs tablet   Commonly known as: XARELTO   Take 1 tablet (15 mg total) by mouth 2 (two) times daily with a meal.      Rivaroxaban 20 MG Tabs   Commonly known as: XARELTO   Take 1 tablet (20 mg total) by mouth daily. Take 1 tablet (20mg  total) by mouth daily.  Start AFTER finshing 21 days of  rivaroxaban 15mg  two times a day.      SEROQUEL XR 50 MG Tb24   Generic drug: QUEtiapine   Take 50  mg by mouth daily. At 6pm      spironolactone 25 MG tablet   Commonly known as: ALDACTONE   Take 25 mg by mouth daily.          The results of significant diagnostics from this hospitalization (including imaging, microbiology, ancillary and laboratory) are listed below for reference.    Significant Diagnostic Studies: Dg Chest 1 View  07/20/2012  *RADIOLOGY REPORT*  Clinical Data: Congestive heart failure.  Shortness breath. Right leg swelling and pain.  CHEST - 1 VIEW  Comparison: 07/18/2012  Findings: Severe cardiomegaly is stable.  AICD remains in stable position.  Pulmonary vascular congestion and mild interstitial edema pattern show no significant change.  IMPRESSION: Stable cardiomegaly and mild diffuse interstitial edema, consistent with congestive heart failure.   Original Report Authenticated By: Myles Rosenthal, M.D.    Dg Chest 1 View  07/18/2012  *RADIOLOGY REPORT*  Clinical Data: Right lower extremity pain and swelling.  CHEST - 1 VIEW  Comparison: Chest 05/02/2012 and CT chest 04/23/2012.  Findings: AICD is in place.  There is massive cardiomegaly and mild interstitial edema.  Left basilar atelectasis is noted.  No pneumothorax.  IMPRESSION: Massive cardiomegaly and mild interstitial edema.   Original Report Authenticated By: Bernadene Bell. D'ALESSIO, M.D.    Dg Tibia/fibula Right  07/18/2012  *RADIOLOGY REPORT*  Clinical Data: Swelling and pain.  No known injury.  RIGHT TIBIA AND FIBULA - 2 VIEW  Comparison: None.  Findings: There is diffuse soft tissue swelling.  No soft tissue gas collection or radiopaque foreign body is identified.  There is no fracture or dislocation.  No bony destructive change is noted. Bones appear osteopenic.  IMPRESSION:  1.  Diffuse soft tissue swelling without underlying focal bony or joint abnormality. 2.  Osteopenia.   Original Report Authenticated By:  Bernadene Bell. D'ALESSIO, M.D.    Dg Ankle Complete Right  07/18/2012  *RADIOLOGY REPORT*  Clinical Data: Diffuse soft tissue swelling.  No known injury.  RIGHT ANKLE - COMPLETE 3+ VIEW  Comparison: None.  Findings: Diffuse and marked soft tissue swelling is identified. No soft tissue gas collection or radiopaque foreign body is identified.  There is no fracture or dislocation.  Bones appear osteopenic.  No bony destructive change.  IMPRESSION:  1.  Diffuse soft tissue swelling without underlying focal bony abnormality. 2.  Osteopenia.   Original Report Authenticated By: Bernadene Bell. D'ALESSIO, M.D.    Nm Pulmonary Perfusion  07/20/2012  *RADIOLOGY REPORT*  Clinical Data:  Shortness of breath.  Lower extremity DVT.  NUCLEAR MEDICINE PERFUSION LUNG SCAN  Technique:  Perfusion images were obtained in multiple projections after intravenous injection of radiopharmaceutical.  Radiopharmaceutical: D 2.5 mCi Tc-69m MAA.  Comparison:  Chest radiograph also obtained today  Findings: Perfusion images show no segmental pulmonary perfusion defects in either lung.  Non segmental defects seen due to massive cardiomegaly and enlarged central pulmonary arteries.  IMPRESSION: No segmental pulmonary perfusion defects to suggest pulmonary embolism.   Original Report Authenticated By: Myles Rosenthal, M.D.    Dg Abd 2 Views  07/19/2012  *RADIOLOGY REPORT*  Clinical Data: Abdominal pain.  ABDOMEN - 2 VIEW  Comparison: 08/29/2009  Findings: Supine and left lateral decubitus images of the abdomen were obtained.  There is no evidence for free air.  Surgical clips in the right upper abdomen.  There is a nonobstructive bowel gas pattern.  Multiple calcifications in the pelvis are suggestive for phleboliths.  The bony structures are intact.  IMPRESSION: Nonspecific  bowel gas pattern.   Original Report Authenticated By: Richarda Overlie, M.D.    Dg Abd Portable 1v  07/19/2012  *RADIOLOGY REPORT*  Clinical Data: Vena cava filter placement  PORTABLE  ABDOMEN - 1 VIEW  Comparison: None.  Findings: There is a filter in the inferior vena cava region.  The apex of the filter is at the L1-2 level.  There are clips in the gallbladder fossa region.  The bowel gas pattern is normal.  No obstruction or free air is seen on this supine examination.  There is contrast in the urinary bladder.  There are phleboliths in the pelvis.  IMPRESSION: Filter apex is at the L1-2 level in the region of the inferior vena cava.  Bowel gas pattern within normal limits.   Original Report Authenticated By: Bretta Bang, M.D.    Dg C-arm 1-60 Min-no Report  07/19/2012  CLINICAL DATA: Insertion IVC Filter   C-ARM 1-60 MINUTES  Fluoroscopy was utilized by the requesting physician.  No radiographic  interpretation.       Microbiology: Recent Results (from the past 240 hour(s))  CULTURE, BLOOD (ROUTINE X 2)     Status: Normal (Preliminary result)   Collection Time   07/19/12  2:10 PM      Component Value Range Status Comment   Specimen Description BLOOD RIGHT ARM   Final    Special Requests BOTTLES DRAWN AEROBIC AND ANAEROBIC 10CC   Final    Culture  Setup Time 07/19/2012 21:42   Final    Culture     Final    Value:        BLOOD CULTURE RECEIVED NO GROWTH TO DATE CULTURE WILL BE HELD FOR 5 DAYS BEFORE ISSUING A FINAL NEGATIVE REPORT   Report Status PENDING   Incomplete   CULTURE, BLOOD (ROUTINE X 2)     Status: Normal (Preliminary result)   Collection Time   07/19/12  2:15 PM      Component Value Range Status Comment   Specimen Description BLOOD RIGHT ARM   Final    Special Requests BOTTLES DRAWN AEROBIC ONLY 10CC   Final    Culture  Setup Time 07/19/2012 21:42   Final    Culture     Final    Value:        BLOOD CULTURE RECEIVED NO GROWTH TO DATE CULTURE WILL BE HELD FOR 5 DAYS BEFORE ISSUING A FINAL NEGATIVE REPORT   Report Status PENDING   Incomplete   URINE CULTURE     Status: Normal   Collection Time   07/19/12 11:24 PM      Component Value Range Status  Comment   Specimen Description URINE, CATHETERIZED   Final    Special Requests NONE   Final    Culture  Setup Time 07/20/2012 12:47   Final    Colony Count NO GROWTH   Final    Culture NO GROWTH   Final    Report Status 07/21/2012 FINAL   Final      Labs: Basic Metabolic Panel:  Lab 07/21/12 0981 07/20/12 0534 07/19/12 0630 07/18/12 0237  NA 137 138 138 138  K 3.5 4.7 -- --  CL 107 107 103 103  CO2 19 17* 17* 19  GLUCOSE 140* 35* 160* 126*  BUN 12 18 20 15   CREATININE 0.78 0.93 1.19* 0.90  CALCIUM 8.7 8.8 9.2 9.2  MG 1.8 -- -- --  PHOS -- -- -- --   Liver Function Tests:  Lab 07/19/12  0630 07/18/12 0240  AST 22 33  ALT 12 13  ALKPHOS 73 71  BILITOT 2.0* 1.2  PROT 6.9 7.4  ALBUMIN 3.1* 3.4*   No results found for this basename: LIPASE:5,AMYLASE:5 in the last 168 hours  Lab 07/19/12 1015  AMMONIA 38   CBC:  Lab 07/22/12 0540 07/21/12 0655 07/20/12 0534 07/19/12 0630 07/18/12 2004 07/18/12 0237  WBC 5.3 5.8 6.0 6.2 -- 5.6  NEUTROABS -- -- -- -- -- 3.9  HGB 9.1* 8.8* 8.9* 9.6* -- 9.6*  HCT 29.5* 27.7* 28.6* 31.1* -- 29.1*  MCV 77.6* 77.4* 77.3* 78.5 -- 74.4*  PLT 183 175 176 186 143* --   Cardiac Enzymes:  Lab 07/18/12 0240  CKTOTAL --  CKMB --  CKMBINDEX --  TROPONINI <0.30   BNP: No components found with this basename: POCBNP:5 CBG:  Lab 07/22/12 0644 07/21/12 2120 07/21/12 1625 07/21/12 1449 07/21/12 1225  GLUCAP 115* 152* 144* 148* 229*    Time coordinating discharge:  Greater than 30 minutes  Signed:  Riyanna Crutchley, DO [facility] Pager: [phone] 07/22/2012, 9:44 AM

## 2012-07-22 NOTE — Progress Notes (Signed)
Awaiting the results from her hypercoagulability studies. Her 24 urine does not show nephrotic syndrome. She is excreting about 300 mg of protein a day.  Her pro time is still elevated. We're awaiting the results of the lupus anticoagulant and anti-cardiolipin antibody panel.  She does have the IVC filter in place.  As far as long-term anticoagulation, I believe that one of the newer oral anticoagulants that we have would be appropriate. Please do not require PT or PTT evaluation. They're not affected by PT or PTT.  I would probably favor Xarelto as this is being given daily. Initially, Xarelto is given twice a day for 21 days and then daily. The only contraindication for Xarelto is renal function. She has good renal function.  Her anemia is a little improved. 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 I am check her iron studies.  This is still a very complex case. Hopefully, when the test from her hypercoagulable workup will be able to shows why she has her issues. Her  Pete E.

## 2012-07-22 NOTE — Progress Notes (Signed)
Pt dc home per MD order, dc instruction, medication schedule and appointment instruction given to the patient and to her son. IV and tele monitor discontinue. Belongings given to family member. Pt taken to the main lobby on wheel chair, son on her side.

## 2012-07-25 ENCOUNTER — Ambulatory Visit (HOSPITAL_BASED_OUTPATIENT_CLINIC_OR_DEPARTMENT_OTHER): Payer: Medicare Other | Admitting: Hematology & Oncology

## 2012-07-25 ENCOUNTER — Telehealth: Payer: Self-pay | Admitting: Hematology & Oncology

## 2012-07-25 ENCOUNTER — Other Ambulatory Visit (HOSPITAL_BASED_OUTPATIENT_CLINIC_OR_DEPARTMENT_OTHER): Payer: Medicare Other | Admitting: Lab

## 2012-07-25 VITALS — BP 127/87 | HR 96 | Temp 98.0°F | Resp 16 | Ht 62.0 in | Wt 135.0 lb

## 2012-07-25 DIAGNOSIS — I82409 Acute embolism and thrombosis of unspecified deep veins of unspecified lower extremity: Secondary | ICD-10-CM

## 2012-07-25 DIAGNOSIS — Z86718 Personal history of other venous thrombosis and embolism: Secondary | ICD-10-CM

## 2012-07-25 DIAGNOSIS — D6859 Other primary thrombophilia: Secondary | ICD-10-CM

## 2012-07-25 DIAGNOSIS — R791 Abnormal coagulation profile: Secondary | ICD-10-CM

## 2012-07-25 LAB — CBC WITH DIFFERENTIAL (CANCER CENTER ONLY)
BASO#: 0 10*3/uL (ref 0.0–0.2)
Eosinophils Absolute: 0 10*3/uL (ref 0.0–0.5)
HCT: 31.9 % — ABNORMAL LOW (ref 34.8–46.6)
HGB: 10 g/dL — ABNORMAL LOW (ref 11.6–15.9)
LYMPH%: 21 % (ref 14.0–48.0)
MCH: 24.6 pg — ABNORMAL LOW (ref 26.0–34.0)
MCV: 79 fL — ABNORMAL LOW (ref 81–101)
MONO#: 0.6 10*3/uL (ref 0.1–0.9)
MONO%: 8.7 % (ref 0.0–13.0)
RBC: 4.06 10*6/uL (ref 3.70–5.32)
WBC: 6.8 10*3/uL (ref 3.9–10.0)

## 2012-07-25 LAB — CULTURE, BLOOD (ROUTINE X 2): Culture: NO GROWTH

## 2012-07-25 LAB — TECHNOLOGIST REVIEW CHCC SATELLITE: Tech Review: 5

## 2012-07-25 NOTE — Telephone Encounter (Signed)
Per MD scheduled 11-14 appointment for pt to see Dr. Rodena Medin. Pt is aware of 130 pm appointment

## 2012-07-25 NOTE — Progress Notes (Signed)
This office note has been dictated.

## 2012-07-28 ENCOUNTER — Emergency Department (HOSPITAL_BASED_OUTPATIENT_CLINIC_OR_DEPARTMENT_OTHER): Payer: Medicare Other

## 2012-07-28 ENCOUNTER — Emergency Department (HOSPITAL_BASED_OUTPATIENT_CLINIC_OR_DEPARTMENT_OTHER)
Admission: EM | Admit: 2012-07-28 | Discharge: 2012-07-28 | Disposition: A | Discharge status: Home / Self Care | Payer: Medicare Other | Attending: Emergency Medicine | Admitting: Emergency Medicine

## 2012-07-28 DIAGNOSIS — G40909 Epilepsy, unspecified, not intractable, without status epilepticus: Secondary | ICD-10-CM | POA: Insufficient documentation

## 2012-07-28 DIAGNOSIS — I10 Essential (primary) hypertension: Secondary | ICD-10-CM | POA: Insufficient documentation

## 2012-07-28 DIAGNOSIS — G8190 Hemiplegia, unspecified affecting unspecified side: Secondary | ICD-10-CM | POA: Insufficient documentation

## 2012-07-28 DIAGNOSIS — Z79899 Other long term (current) drug therapy: Secondary | ICD-10-CM | POA: Insufficient documentation

## 2012-07-28 DIAGNOSIS — R109 Unspecified abdominal pain: Secondary | ICD-10-CM

## 2012-07-28 DIAGNOSIS — E079 Disorder of thyroid, unspecified: Secondary | ICD-10-CM | POA: Insufficient documentation

## 2012-07-28 DIAGNOSIS — E1129 Type 2 diabetes mellitus with other diabetic kidney complication: Secondary | ICD-10-CM | POA: Insufficient documentation

## 2012-07-28 DIAGNOSIS — J45909 Unspecified asthma, uncomplicated: Secondary | ICD-10-CM | POA: Insufficient documentation

## 2012-07-28 DIAGNOSIS — Z87891 Personal history of nicotine dependence: Secondary | ICD-10-CM | POA: Insufficient documentation

## 2012-07-28 DIAGNOSIS — Z950 Presence of cardiac pacemaker: Secondary | ICD-10-CM | POA: Insufficient documentation

## 2012-07-28 DIAGNOSIS — Z9089 Acquired absence of other organs: Secondary | ICD-10-CM | POA: Insufficient documentation

## 2012-07-28 LAB — CBC WITH DIFFERENTIAL/PLATELET
Basophils Absolute: 0 10*3/uL (ref 0.0–0.1)
Blasts: 0 %
MCH: 24 pg — ABNORMAL LOW (ref 26.0–34.0)
MCHC: 31.3 g/dL (ref 30.0–36.0)
Myelocytes: 0 %
Neutro Abs: 6.4 10*3/uL (ref 1.7–7.7)
Neutrophils Relative %: 85 % — ABNORMAL HIGH (ref 43–77)
Platelets: 207 10*3/uL (ref 150–400)
Promyelocytes Absolute: 0 %
RDW: 22 % — ABNORMAL HIGH (ref 11.5–15.5)
nRBC: 0 /100 WBC

## 2012-07-28 LAB — COMPREHENSIVE METABOLIC PANEL
ALT: 11 U/L (ref 0–35)
Alkaline Phosphatase: 60 U/L (ref 39–117)
Chloride: 108 mEq/L (ref 96–112)
GFR calc Af Amer: >90 mL/min (ref >90)
Glucose, Bld: 66 mg/dL — ABNORMAL LOW (ref 70–99)
Potassium: 3.1 mEq/L — ABNORMAL LOW (ref 3.5–5.1)
Sodium: 146 mEq/L — ABNORMAL HIGH (ref 135–145)
Total Protein: 7.2 g/dL (ref 6.0–8.3)

## 2012-07-28 MED ORDER — SODIUM CHLORIDE 0.9 % IV BOLUS (SEPSIS): 1000.0000 mL | Freq: Once | INTRAVENOUS | Status: DC

## 2012-07-28 NOTE — ED Notes (Signed)
Per EMS family called EMS because the patient had been c/o L side ABD pain, no vomiting. Patient paralyzed on R side from previous stroke

## 2012-07-28 NOTE — ED Notes (Signed)
On attempt to get patient undressed, patient stated multiple times "I am cold". Patient given warm blanket. Attempts made to get patient undressed and patient refuses. Assigned RN made aware.

## 2012-07-28 NOTE — ED Provider Notes (Signed)
History    This chart was scribed for Geoffery Lyons, MD, MD by Smitty Pluck, ED Scribe. The patient was seen in room MH11 and the patient's care was started at 6:16PM.   CSN: [id number]  Arrival date & time 07/28/12  1803      Chief Complaint  Patient presents with  . Abdominal Pain    The history is provided by the patient. The history is limited by the absence of a caregiver. No language interpreter was used.  Pt is level 5 caveat due to being non verbal and uncooperative  Elizabeth Love is a 56 y.o. female with hx of stroke who presents to the Emergency Department BIB EMS due to family calling EMS and reporting pt complaining of constant, moderate left abdominal pain onset today. EMS reports that family states pt is usually non verbal since stroke and has right side weakness.     Past Medical History  Diagnosis Date  . Seizures   . Hypertension   . Stroke     2007 - R sided weakness  . Asthma   . Diabetes mellitus   . 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 Thyroid disease   . LV dysfunction     Per brother, EF 17% s/p AICD 2007 (St. Jude). No hx CAD.    Past Surgical History  Procedure Date  . Cholecystectomy   . Appendectomy   . Pacemaker insertion   . Vena cava filter placement 07/19/2012    Procedure: INSERTION VENA-CAVA FILTER;  Surgeon: Larina Earthly, MD;  Location: [facility] OR;  Service: Vascular;  Laterality: [name];    Family History  Problem Relation Age of Onset  . Coronary artery disease Neg Hx     History  Substance Use Topics  . Smoking status: Former Games developer  . Smokeless tobacco: Not on file  . Alcohol Use: No    OB History    Grav Para Term Preterm Abortions TAB SAB Ect Mult Living                  Review of Systems  Unable to perform ROS     Allergies  Penicillins and Sulfa antibiotics  Home Medications   Current Outpatient Rx  Name  Route  Sig  Dispense  Refill  . ACETAMINOPHEN ER 650 MG PO TBCR   Oral   Take 650 mg by mouth  every 8 (eight) hours as needed. For pain         . ALBUTEROL SULFATE (2.5 MG/3ML) 0.083% IN NEBU   Nebulization   Take 2.5 mg by nebulization every 6 (six) hours as needed. For cough or wheeze         . ALPRAZOLAM 0.25 MG PO TABS   Oral   Take 0.25 mg by mouth at bedtime as needed. For sleep         . DIGOXIN 0.125 MG PO TABS   Oral   Take 0.125 mg by mouth daily.         Marland Kitchen ESCITALOPRAM OXALATE 10 MG PO TABS   Oral   Take 10 mg by mouth daily.         . FUROSEMIDE 40 MG PO TABS   Oral   Take 1 tablet (40 mg total) by mouth daily.   30 tablet   1   . GLIPIZIDE ER 5 MG PO TB24   Oral   Take 1 tablet (5 mg total) by mouth daily.  30 tablet   1   . LEVETIRACETAM 1000 MG PO TABS   Oral   Take 1,000 mg by mouth 2 (two) times daily.         Marland Kitchen LEVOTHYROXINE SODIUM 50 MCG PO TABS   Oral   Take 50 mcg by mouth daily.         Marland Kitchen LISINOPRIL 5 MG PO TABS   Oral   Take 5 mg by mouth daily.         Marland Kitchen METOPROLOL TARTRATE 25 MG PO TABS   Oral   Take 25 mg by mouth 2 (two) times daily.         Marland Kitchen OMEPRAZOLE 20 MG PO CPDR   Oral   Take 20 mg by mouth 2 (two) times daily.         . OXYCODONE-ACETAMINOPHEN 10-325 MG PO TABS   Oral   Take 1 tablet by mouth 3 (three) times daily as needed for pain. For pain   15 tablet   0   . QUETIAPINE FUMARATE ER 50 MG PO TB24   Oral   Take 50 mg by mouth daily. At 6pm         . RIVAROXABAN 15 MG PO TABS   Oral   Take 1 tablet (15 mg total) by mouth 2 (two) times daily with a meal.   42 tablet   0   . SPIRONOLACTONE 25 MG PO TABS   Oral   Take 25 mg by mouth daily.           BP 142/87  Pulse 63  Temp 97.7 F (36.5 C) (Oral)  Resp 20  SpO2 98%  Physical Exam  Nursing note and vitals reviewed. Constitutional: She appears well-developed and well-nourished.  HENT:  Head: Normocephalic and atraumatic.  Eyes: Conjunctivae normal are normal.  Neck: Normal range of motion. Neck supple.  Cardiovascular:  Normal rate, regular rhythm and normal heart sounds.   Pulmonary/Chest: Effort normal. No respiratory distress.  Abdominal: There is tenderness in the right lower quadrant and left lower quadrant.  Musculoskeletal:       right hemiparesis   Neurological: She is alert.       Non verbal   Skin: Skin is warm and dry.  Psychiatric: She has a normal mood and affect. Her behavior is normal.    ED Course  Procedures (including critical care time) DIAGNOSTIC STUDIES: Oxygen Saturation is 98% on room air, normal by my interpretation.    COORDINATION OF CARE: 6:21 PM Discussed ED treatment with pt     Labs Reviewed  CBC WITH DIFFERENTIAL - Abnormal; Notable for the following:    Hemoglobin 9.5 (*)     HCT 30.4 (*)     MCV 76.8 (*)     MCH 24.0 (*)     RDW 22.0 (*)     Neutrophils Relative 85 (*)     Lymphocytes Relative 11 (*)     All other components within normal limits  COMPREHENSIVE METABOLIC PANEL - Abnormal; Notable for the following:    Sodium 146 (*)     Potassium 3.1 (*)     Glucose, Bld 66 (*)     Albumin 3.4 (*)     Total Bilirubin 1.9 (*)     GFR calc non Af Amer 82 (*)     All other components within normal limits  LIPASE, BLOOD  URINALYSIS, ROUTINE W REFLEX MICROSCOPIC   Ct Abdomen Pelvis Wo Contrast  07/28/2012  *  RADIOLOGY REPORT*  Clinical Data: Left abdominal pain.  Seizures.  Diabetes.  CT ABDOMEN AND PELVIS WITHOUT CONTRAST  Technique:  Multidetector CT imaging of the abdomen and pelvis was performed following the standard protocol without intravenous contrast.  Comparison: 07/19/2012  Findings: The patient was unable to raise her arms, resulting in some streak artifact in the upper abdomen.  Prominent cardiomegaly noted.  Small right pleural effusion may be partially loculated.  There is some atelectasis the right middle lobe.  Perihepatic ascites noted with ascites in the paracolic gutters and in the pelvis.  Amount of ascites is mild to moderate.  The  visualized portion of the liver, spleen, pancreas, and adrenal glands appear unremarkable in noncontrast CT appearance.  Infrarenal IVC filter noted.  Aortoiliac atherosclerotic calcification is present.  Noncontrast CT appearance of the kidneys normal, without hydronephrosis, hydroureter, or ureteral calculus.  Low-level stranding is observed in the retroperitoneum, etiology uncertain.  No pathologic retroperitoneal adenopathy.  The gallbladder is surgically absent.  No dilated bowel or free intraperitoneal gas noted.  There are air- fluid levels in the descending colon, which can be encountered in the setting of diarrheal disease.  Scattered descending colon diverticula noted without active diverticulitis observed.  Uterus absent.  Urinary bladder unremarkable.  No left-sided hernia is identified.  Mild stranding in the right inguinal region without hematoma observed.  Appendix surgically absent.  IMPRESSION:  1.  Mild to moderate abdominal ascites. 2.  Marked cardiomegaly - chronic. 3.  Small right pleural effusion. 4.  Air-fluid levels in the distal colon, suspicious for diarrheal process. 5.  Low-level retroperitoneal stranding/edema, of uncertain significance.   Original Report Authenticated By: Gaylyn Rong, M.D.      No diagnosis found.    MDM  The patient presents with right sided abd pain.  This was conveyed through the family as the patient has a history of stroke and is minimally verbal.  The history was taken mainly through ems and the son who was present at bedside.  The workup reveals reassuring labs and ct scan, and she does not appear toxic or in any distress.  She will be discharged to home.      I personally performed the services described in this documentation, which was scribed in my presence. The recorded information has been reviewed and is accurate.      Geoffery Lyons, MD 07/29/12 [phone]

## 2012-07-28 NOTE — ED Notes (Signed)
Patient transported to CT 

## 2012-07-28 NOTE — ED Notes (Signed)
Patient clean, dry, and brief changed. Warm blankets given. Soiled belongings placed in patient belongings bag at bedside.

## 2012-08-01 ENCOUNTER — Ambulatory Visit (INDEPENDENT_AMBULATORY_CARE_PROVIDER_SITE_OTHER): Payer: Medicare Other | Admitting: Internal Medicine

## 2012-08-01 ENCOUNTER — Encounter: Payer: Self-pay | Admitting: Internal Medicine

## 2012-08-01 VITALS — BP 118/68 | HR 71 | Temp 97.7°F | Resp 16 | Ht 62.0 in | Wt 135.0 lb

## 2012-08-01 DIAGNOSIS — M549 Dorsalgia, unspecified: Secondary | ICD-10-CM

## 2012-08-01 DIAGNOSIS — G40909 Epilepsy, unspecified, not intractable, without status epilepticus: Secondary | ICD-10-CM

## 2012-08-01 DIAGNOSIS — G8929 Other chronic pain: Secondary | ICD-10-CM

## 2012-08-01 DIAGNOSIS — R569 Unspecified convulsions: Secondary | ICD-10-CM

## 2012-08-03 NOTE — Progress Notes (Signed)
DIAGNOSES: 1. Bilateral DVT (deep venous thrombosis). 2. Elevated PT/PTT. 3. History of DVT.  CURRENT THERAPY:  Xarelto 20 mg p.o. daily.  INTERIM HISTORY:  Elizabeth Love comes in for her first office visit.  I saw her at [facility].  She was admitted there earlier this month.  She lives in [place].  She was admitted because of bilateral DVTs.  She has a very interesting history.  For reasons I cannot figure out, she has had an elevated PT and PTT.  She has never had any bleeding problems.  She has history of a CVA with subsequent neurological deficits.  She had a workup while in the hospital.  A hypercoagulable panel was done.  It did show that she had markedly decreased protein C levels. This was before any anticoagulation.  She had a negative factor V Leiden mutation.  She had negative prothrombin II gene mutation.  Again, I cannot understand why her PT and PTT have been elevated.  She does not have any obvious liver disease.  She has poor cardiac function with an ejection fraction I think less than 20%.  As such, she may have some hepatic congestion which might cause her clotting factors to decrease.  She has never had any obvious bleeding despite the elevated PT and PTT.  She was discharged and now comes back to see [place] so that we can watch over her coagulability problems.  While hospitalized, she did have an IVC filter placed.  She did not have any pulmonary embolism while in the hospital.  Dopplers of her legs did show the bilateral DVTs.  Right side was popliteal vein, left side was mid posterior tibial vein through the popliteal vein.  She has been doing okay at home since discharge.  I was worried about her having nephrotic syndrome as possible cause of her hypercoagulable state.  She is not excreting too much of protein.  She has had no obvious headache.  She has had no dysphagia or odynophagia.  She is not eating all that much.  She has had no cough. There  is no shortness of breath.  There has been no bleeding.  PAST MEDICAL HISTORY: 1. Hypertension. 2. Diabetes. 3. Status post cholecystectomy. 4. Pacemaker insertion. 5. Cardiomyopathy. 6. Hypothyroidism. 7.  MEDICATIONS: 1. Proventil nebulizer 2.5 mg q.6 hours p.r.n. 2. Xanax 0.25 mg p.o. q.h.s. p.r.n. 3. Lasix 40 mg p.o. daily. 4. Glucotrol XL 100 mg p.o. b.i.d. 5. Keppra 1000 mg p.o. b.i.d. 6. Synthroid 0.5 mg p.o. daily. 7. Lisinopril 5 mg p.o. daily. 8. Metoprolol 25 mg p.o. b.i.d. 9. Prilosec 20 mg p.o. b.i.d. 10.Percocet (10/625) one p.o. t.i.d. p.r.n. 11.Seroquel XR 50 mg p.o. daily at 6 p.m. 12.Aldactone 25 mg p.o. daily. 13.Hytrin 2 mg p.o. daily. 14.  SOCIAL HISTORY:  Remarkable for I think past tobacco use.  She has no alcohol use.  FAMILY HISTORY:  Noncontributory.  IMPRESSION:  General:  Elizabeth Love is a well-developed, well-nourished white female in no obvious distress.  Vital signs:  98, pulse 87, respiratory rate 16, blood pressure 127/87.  Weight 135.  Head and neck: Shows no ocular or oral lesions.  There is no adenopathy in the neck. Lungs:  Clear bilaterally.  Cardiac:  Regular rate and rhythm with normal S1, S2.  There are no murmurs, rubs or bruits.  Abdomen:  Soft with good bowel sounds.  There is no palpable hepatosplenomegaly. Extremities:  Shows some edema of the legs.  Neurological:  Shows some weakness I  think on her right side.  LABORATORY STUDIES:  Show a pro-time of 44.47 with an INR of 3.7.  Her lupus anticoagulant was negative.  Anticardiolipin antibodies are normal.  IMPRESSION:  Elizabeth Love is a 56 year old African American female with protein C deficiency.  She will need to be on lifelong anticoagulation from my point of view.  I think that her blood is quite hypercoagulable and that she will have another blood clot when we stop her Xarelto.  For now, we will plan to get her back in another month or so to see how she is doing.  I do  not see that we need to do any scans on her.  I still cannot figure out why her pro-time is elevated.  I do not see any obvious intrinsic liver issue.  She does not have nephrotic syndrome.   ______________________________ Josph Macho, M.D. PRE/MEDQ  D:  08/03/2012  T:  08/03/2012  Job:  [id number]

## 2012-08-04 DIAGNOSIS — G40909 Epilepsy, unspecified, not intractable, without status epilepticus: Secondary | ICD-10-CM | POA: Insufficient documentation

## 2012-08-04 DIAGNOSIS — G8929 Other chronic pain: Secondary | ICD-10-CM | POA: Insufficient documentation

## 2012-08-04 NOTE — Assessment & Plan Note (Signed)
Pain clinic referral 

## 2012-08-04 NOTE — Assessment & Plan Note (Signed)
Neurology referral 

## 2012-08-04 NOTE — Progress Notes (Signed)
  Subjective:    Patient ID: Elizabeth Love, female    DOB: [birth year], 56 y.o.   MRN: [id number]  HPI Pt presents to clinic to establish care. Known h/o stroke with associated sz d/o. Last GTC sz >1 year ago. Per son has absence sz's q 2weeks. Previously followed by neurology but not recently. Recently hospitalized with LE DVT. S/p ivc filter and tx'ed with xarelto under direction of hematology. Swelling decreased per son. Son notes h/o chronic back pain for some time. Has in the past taken oxycontin but not for several months. More recently for the last several months has been tx'ed with percocet prn. Has recent printed prescription. Has interest in pursuing pain clinic referral for assistance with management. States recently s/p influenza vaccine and pneumovax. Followed by cardiology for cardiomyopathy. Diabetes under better control since hospital with range of 70-120's without hypoglycemia.   Past Medical History  Diagnosis Date  . Seizures   . Hypertension   . Stroke     2007 - R sided weakness  . Asthma   . Diabetes mellitus   . 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 Thyroid disease   . LV dysfunction     Per brother, EF 17% s/p AICD 2007 ([employer]. Jude). No hx CAD.   Past Surgical History  Procedure Date  . Cholecystectomy   . Appendectomy   . Pacemaker insertion   . Vena cava filter placement 07/19/2012    Procedure: INSERTION VENA-CAVA FILTER;  Surgeon: Larina Earthly, MD;  Location: [facility] OR;  Service: Vascular;  Laterality: [name];    reports that she has quit smoking. She does not have any smokeless tobacco history on file. She reports that she does not drink alcohol or use illicit drugs. family history is negative for Coronary artery disease. Allergies  Allergen Reactions  . Penicillins Rash  . Sulfa Antibiotics Rash     Review of Systems  Musculoskeletal: Positive for back pain.  Neurological: Positive for seizures and weakness.  All other systems reviewed and are  negative.       Objective:   Physical Exam  Nursing note and vitals reviewed. Constitutional: She appears well-nourished. No distress.  HENT:  Head: Normocephalic and atraumatic.  Right Ear: External ear normal.  Left Ear: External ear normal.  Eyes: Conjunctivae normal are normal. No scleral icterus.  Neck: Neck supple.  Cardiovascular: Normal rate, regular rhythm and normal heart sounds.   Pulmonary/Chest: Effort normal and breath sounds normal. No respiratory distress. She has no wheezes. She has no rales.  Neurological: She is alert.       Hemiparetic sitting in WC  Skin: Skin is warm and dry. She is not diaphoretic.       Trace bilateral le edema          Assessment & Plan:

## 2012-08-22 ENCOUNTER — Other Ambulatory Visit: Payer: Medicare Other | Admitting: Lab

## 2012-08-22 ENCOUNTER — Ambulatory Visit: Payer: Medicare Other | Admitting: Hematology & Oncology

## 2012-08-24 ENCOUNTER — Inpatient Hospital Stay (HOSPITAL_COMMUNITY): Payer: Medicare Other

## 2012-08-24 ENCOUNTER — Inpatient Hospital Stay (HOSPITAL_BASED_OUTPATIENT_CLINIC_OR_DEPARTMENT_OTHER)
Admission: EM | Admit: 2012-08-24 | Discharge: 2012-08-29 | DRG: 690 | Disposition: A | Discharge status: Home / Self Care | Payer: Medicare Other | Source: Ambulatory Visit | Attending: Internal Medicine | Admitting: Internal Medicine

## 2012-08-24 ENCOUNTER — Encounter (HOSPITAL_BASED_OUTPATIENT_CLINIC_OR_DEPARTMENT_OTHER): Payer: Self-pay | Admitting: Emergency Medicine

## 2012-08-24 ENCOUNTER — Emergency Department (HOSPITAL_BASED_OUTPATIENT_CLINIC_OR_DEPARTMENT_OTHER): Payer: Medicare Other

## 2012-08-24 DIAGNOSIS — I509 Heart failure, unspecified: Secondary | ICD-10-CM | POA: Diagnosis present

## 2012-08-24 DIAGNOSIS — G894 Chronic pain syndrome: Secondary | ICD-10-CM | POA: Diagnosis present

## 2012-08-24 DIAGNOSIS — D6859 Other primary thrombophilia: Secondary | ICD-10-CM | POA: Diagnosis present

## 2012-08-24 DIAGNOSIS — I428 Other cardiomyopathies: Secondary | ICD-10-CM | POA: Diagnosis present

## 2012-08-24 DIAGNOSIS — I4891 Unspecified atrial fibrillation: Secondary | ICD-10-CM | POA: Diagnosis present

## 2012-08-24 DIAGNOSIS — F1110 Opioid abuse, uncomplicated: Secondary | ICD-10-CM

## 2012-08-24 DIAGNOSIS — E876 Hypokalemia: Secondary | ICD-10-CM | POA: Diagnosis present

## 2012-08-24 DIAGNOSIS — Z95810 Presence of automatic (implantable) cardiac defibrillator: Secondary | ICD-10-CM

## 2012-08-24 DIAGNOSIS — I69320 Aphasia following cerebral infarction: Secondary | ICD-10-CM

## 2012-08-24 DIAGNOSIS — D696 Thrombocytopenia, unspecified: Secondary | ICD-10-CM | POA: Diagnosis present

## 2012-08-24 DIAGNOSIS — G40909 Epilepsy, unspecified, not intractable, without status epilepticus: Secondary | ICD-10-CM | POA: Diagnosis present

## 2012-08-24 DIAGNOSIS — Z87891 Personal history of nicotine dependence: Secondary | ICD-10-CM

## 2012-08-24 DIAGNOSIS — R188 Other ascites: Secondary | ICD-10-CM | POA: Diagnosis present

## 2012-08-24 DIAGNOSIS — G8929 Other chronic pain: Secondary | ICD-10-CM

## 2012-08-24 DIAGNOSIS — Z79899 Other long term (current) drug therapy: Secondary | ICD-10-CM

## 2012-08-24 DIAGNOSIS — N390 Urinary tract infection, site not specified: Secondary | ICD-10-CM

## 2012-08-24 DIAGNOSIS — I4729 Other ventricular tachycardia: Secondary | ICD-10-CM | POA: Diagnosis present

## 2012-08-24 DIAGNOSIS — I69920 Aphasia following unspecified cerebrovascular disease: Secondary | ICD-10-CM

## 2012-08-24 DIAGNOSIS — I82409 Acute embolism and thrombosis of unspecified deep veins of unspecified lower extremity: Secondary | ICD-10-CM

## 2012-08-24 DIAGNOSIS — I5022 Chronic systolic (congestive) heart failure: Secondary | ICD-10-CM | POA: Diagnosis present

## 2012-08-24 DIAGNOSIS — D689 Coagulation defect, unspecified: Secondary | ICD-10-CM

## 2012-08-24 DIAGNOSIS — I429 Cardiomyopathy, unspecified: Secondary | ICD-10-CM

## 2012-08-24 DIAGNOSIS — E039 Hypothyroidism, unspecified: Secondary | ICD-10-CM | POA: Diagnosis present

## 2012-08-24 DIAGNOSIS — I472 Ventricular tachycardia, unspecified: Secondary | ICD-10-CM | POA: Diagnosis present

## 2012-08-24 DIAGNOSIS — E119 Type 2 diabetes mellitus without complications: Secondary | ICD-10-CM | POA: Diagnosis present

## 2012-08-24 DIAGNOSIS — R1110 Vomiting, unspecified: Secondary | ICD-10-CM

## 2012-08-24 LAB — COMPREHENSIVE METABOLIC PANEL
ALT: 9 U/L (ref 0–35)
AST: 21 U/L (ref 0–37)
Albumin: 3 g/dL — ABNORMAL LOW (ref 3.5–5.2)
Alkaline Phosphatase: 69 U/L (ref 39–117)
BUN: 9 mg/dL (ref 6–23)
CO2: 25 mEq/L (ref 19–32)
Chloride: 106 mEq/L (ref 96–112)
Creatinine, Ser: 0.7 mg/dL (ref 0.50–1.10)
GFR calc Af Amer: >90 mL/min (ref >90)
Glucose, Bld: 90 mg/dL (ref 70–99)
Total Protein: 7.3 g/dL (ref 6.0–8.3)

## 2012-08-24 LAB — CBC WITH DIFFERENTIAL/PLATELET
Basophils Relative: 0 % (ref 0–1)
Eosinophils Absolute: 0 10*3/uL (ref 0.0–0.7)
HCT: 33.2 % — ABNORMAL LOW (ref 36.0–46.0)
Hemoglobin: 10.5 g/dL — ABNORMAL LOW (ref 12.0–15.0)
MCH: 26.1 pg (ref 26.0–34.0)
MCHC: 31.6 g/dL (ref 30.0–36.0)
Monocytes Absolute: 0.8 10*3/uL (ref 0.1–1.0)
Neutro Abs: 5.5 10*3/uL (ref 1.7–7.7)
Neutrophils Relative %: 71 % (ref 43–77)

## 2012-08-24 LAB — URINALYSIS, ROUTINE W REFLEX MICROSCOPIC
Glucose, UA: NEGATIVE mg/dL
Hgb urine dipstick: NEGATIVE
Specific Gravity, Urine: 1.028 (ref 1.005–1.030)
Urobilinogen, UA: 4 mg/dL — ABNORMAL HIGH (ref 0.0–1.0)
pH: 5.5 (ref 5.0–8.0)

## 2012-08-24 LAB — TROPONIN I
Troponin I: <0.3 ng/mL (ref <0.30)
Troponin I: <0.3 ng/mL (ref <0.30)
Troponin I: <0.3 ng/mL (ref <0.30)

## 2012-08-24 LAB — PROTIME-INR: Prothrombin Time: 26.3 seconds — ABNORMAL HIGH (ref 11.6–15.2)

## 2012-08-24 LAB — BASIC METABOLIC PANEL
CO2: 24 mEq/L (ref 19–32)
Calcium: 8.8 mg/dL (ref 8.4–10.5)
Creatinine, Ser: 0.88 mg/dL (ref 0.50–1.10)
Glucose, Bld: 123 mg/dL — ABNORMAL HIGH (ref 70–99)
Sodium: 143 mEq/L (ref 135–145)

## 2012-08-24 LAB — GLUCOSE, CAPILLARY
Glucose-Capillary: 132 mg/dL — ABNORMAL HIGH (ref 70–99)
Glucose-Capillary: 125 mg/dL — ABNORMAL HIGH (ref 70–99)

## 2012-08-24 LAB — URINE MICROSCOPIC-ADD ON

## 2012-08-24 LAB — TSH: TSH: 2.299 u[IU]/mL (ref 0.350–4.500)

## 2012-08-24 LAB — LACTIC ACID, PLASMA: Lactic Acid, Venous: 1.7 mmol/L (ref 0.5–2.2)

## 2012-08-24 MED ORDER — DIGOXIN 125 MCG PO TABS
0.1250 mg | ORAL_TABLET | Freq: Every day | ORAL | Status: DC
  Administered 2012-08-25 – 2012-08-29 (×5): 0.125 mg via ORAL
  Filled 2012-08-24 (×6): qty 1

## 2012-08-24 MED ORDER — LEVETIRACETAM 500 MG PO TABS
1000.0000 mg | ORAL_TABLET | Freq: Two times a day (BID) | ORAL | Status: DC
  Administered 2012-08-24 – 2012-08-26 (×5): 1000 mg via ORAL
  Filled 2012-08-24 (×8): qty 2

## 2012-08-24 MED ORDER — POTASSIUM CHLORIDE CRYS ER 20 MEQ PO TBCR
40.0000 meq | EXTENDED_RELEASE_TABLET | Freq: Once | ORAL | Status: AC
  Administered 2012-08-24: 40 meq via ORAL
  Filled 2012-08-24: qty 2

## 2012-08-24 MED ORDER — ONDANSETRON HCL 4 MG/2ML IJ SOLN
4.0000 mg | Freq: Once | INTRAMUSCULAR | Status: AC
  Administered 2012-08-24: 4 mg via INTRAVENOUS
  Filled 2012-08-24: qty 2

## 2012-08-24 MED ORDER — ACETAMINOPHEN 650 MG RE SUPP: 650.0000 mg | Freq: Four times a day (QID) | RECTAL | Status: DC | PRN

## 2012-08-24 MED ORDER — METOPROLOL TARTRATE 25 MG PO TABS
25.0000 mg | ORAL_TABLET | Freq: Two times a day (BID) | ORAL | Status: DC
  Administered 2012-08-24 – 2012-08-26 (×5): 25 mg via ORAL
  Filled 2012-08-24 (×8): qty 1

## 2012-08-24 MED ORDER — POTASSIUM CHLORIDE 20 MEQ/15ML (10%) PO LIQD
40.0000 meq | Freq: Once | ORAL | Status: AC
  Administered 2012-08-24: 40 meq via ORAL
  Filled 2012-08-24: qty 30

## 2012-08-24 MED ORDER — PANTOPRAZOLE SODIUM 40 MG PO TBEC
40.0000 mg | DELAYED_RELEASE_TABLET | Freq: Every day | ORAL | Status: DC
  Administered 2012-08-25 – 2012-08-29 (×3): 40 mg via ORAL
  Filled 2012-08-24 (×5): qty 1

## 2012-08-24 MED ORDER — DEXTROSE 5 % IV SOLN
1.0000 g | INTRAVENOUS | Status: DC
  Administered 2012-08-24 – 2012-08-28 (×5): 1 g via INTRAVENOUS
  Filled 2012-08-24 (×5): qty 10

## 2012-08-24 MED ORDER — OXYCODONE HCL 5 MG PO TABS
5.0000 mg | ORAL_TABLET | ORAL | Status: DC | PRN
  Administered 2012-08-25 – 2012-08-28 (×3): 5 mg via ORAL
  Filled 2012-08-24 (×3): qty 1

## 2012-08-24 MED ORDER — HYDROMORPHONE HCL PF 1 MG/ML IJ SOLN
1.0000 mg | Freq: Once | INTRAMUSCULAR | Status: AC
  Administered 2012-08-24: 1 mg via INTRAVENOUS
  Filled 2012-08-24: qty 1

## 2012-08-24 MED ORDER — ACETAMINOPHEN 325 MG PO TABS: 650.0000 mg | ORAL_TABLET | Freq: Four times a day (QID) | ORAL | Status: DC | PRN

## 2012-08-24 MED ORDER — LISINOPRIL 5 MG PO TABS
5.0000 mg | ORAL_TABLET | Freq: Every day | ORAL | Status: DC
  Filled 2012-08-24 (×2): qty 1

## 2012-08-24 MED ORDER — HYDROMORPHONE HCL PF 1 MG/ML IJ SOLN
1.0000 mg | INTRAMUSCULAR | Status: DC | PRN
  Administered 2012-08-24 – 2012-08-25 (×4): 1 mg via INTRAVENOUS
  Filled 2012-08-24 (×4): qty 1

## 2012-08-24 MED ORDER — INSULIN ASPART 100 UNIT/ML ~~LOC~~ SOLN
0.0000 [IU] | Freq: Three times a day (TID) | SUBCUTANEOUS | Status: DC
  Administered 2012-08-24 (×2): 2 [IU] via SUBCUTANEOUS
  Administered 2012-08-25 (×2): 3 [IU] via SUBCUTANEOUS
  Administered 2012-08-26 – 2012-08-29 (×4): 2 [IU] via SUBCUTANEOUS

## 2012-08-24 MED ORDER — ONDANSETRON HCL 4 MG/2ML IJ SOLN
4.0000 mg | Freq: Four times a day (QID) | INTRAMUSCULAR | Status: DC | PRN
  Administered 2012-08-24 – 2012-08-25 (×3): 4 mg via INTRAVENOUS
  Filled 2012-08-24 (×3): qty 2

## 2012-08-24 MED ORDER — FUROSEMIDE 40 MG PO TABS: 40.0000 mg | ORAL_TABLET | Freq: Every day | ORAL | Status: DC

## 2012-08-24 MED ORDER — GLIPIZIDE ER 5 MG PO TB24
5.0000 mg | ORAL_TABLET | Freq: Every day | ORAL | Status: DC
  Filled 2012-08-24 (×3): qty 1

## 2012-08-24 MED ORDER — QUETIAPINE FUMARATE ER 50 MG PO TB24
50.0000 mg | ORAL_TABLET | Freq: Every day | ORAL | Status: DC
  Administered 2012-08-26 – 2012-08-29 (×3): 50 mg via ORAL
  Filled 2012-08-24 (×6): qty 1

## 2012-08-24 MED ORDER — MORPHINE SULFATE 4 MG/ML IJ SOLN: 4.0000 mg | Freq: Once | INTRAMUSCULAR | Status: DC

## 2012-08-24 MED ORDER — ALBUTEROL SULFATE (5 MG/ML) 0.5% IN NEBU
2.5000 mg | INHALATION_SOLUTION | Freq: Four times a day (QID) | RESPIRATORY_TRACT | Status: DC
  Administered 2012-08-24 (×2): 2.5 mg via RESPIRATORY_TRACT
  Filled 2012-08-24 (×2): qty 0.5

## 2012-08-24 MED ORDER — DEXTROSE 5 % IV SOLN
1.0000 g | Freq: Once | INTRAVENOUS | Status: AC
  Administered 2012-08-24: 1 g via INTRAVENOUS
  Filled 2012-08-24: qty 10

## 2012-08-24 MED ORDER — ESCITALOPRAM OXALATE 10 MG PO TABS
10.0000 mg | ORAL_TABLET | Freq: Every day | ORAL | Status: DC
  Administered 2012-08-26 – 2012-08-29 (×3): 10 mg via ORAL
  Filled 2012-08-24 (×6): qty 1

## 2012-08-24 MED ORDER — SODIUM CHLORIDE 0.9 % IV BOLUS (SEPSIS): 1000.0000 mL | Freq: Once | INTRAVENOUS | Status: DC

## 2012-08-24 MED ORDER — RIVAROXABAN 15 MG PO TABS
15.0000 mg | ORAL_TABLET | Freq: Two times a day (BID) | ORAL | Status: DC
  Administered 2012-08-24 – 2012-08-25 (×2): 15 mg via ORAL
  Filled 2012-08-24 (×5): qty 1

## 2012-08-24 MED ORDER — POTASSIUM CHLORIDE 10 MEQ/100ML IV SOLN
10.0000 meq | INTRAVENOUS | Status: AC
  Administered 2012-08-24 (×2): 10 meq via INTRAVENOUS
  Filled 2012-08-24 (×2): qty 100

## 2012-08-24 MED ORDER — SODIUM CHLORIDE 0.9 % IV BOLUS (SEPSIS)
500.0000 mL | Freq: Once | INTRAVENOUS | Status: AC
  Administered 2012-08-24: 500 mL via INTRAVENOUS

## 2012-08-24 MED ORDER — OXYCODONE HCL 80 MG PO TB12: 20.0000 mg | ORAL_TABLET | Freq: Two times a day (BID) | ORAL | Status: DC

## 2012-08-24 MED ORDER — SPIRONOLACTONE 25 MG PO TABS
25.0000 mg | ORAL_TABLET | Freq: Every day | ORAL | Status: DC
  Administered 2012-08-26 – 2012-08-29 (×3): 25 mg via ORAL
  Filled 2012-08-24 (×6): qty 1

## 2012-08-24 MED ORDER — POTASSIUM CHLORIDE 10 MEQ/100ML IV SOLN
10.0000 meq | INTRAVENOUS | Status: AC
  Administered 2012-08-24 (×3): 10 meq via INTRAVENOUS
  Filled 2012-08-24 (×4): qty 100

## 2012-08-24 MED ORDER — ALPRAZOLAM 0.25 MG PO TABS: 0.2500 mg | ORAL_TABLET | Freq: Every evening | ORAL | Status: DC | PRN

## 2012-08-24 MED ORDER — POTASSIUM CHLORIDE IN NACL 40-0.9 MEQ/L-% IV SOLN
INTRAVENOUS | Status: DC
  Administered 2012-08-24 – 2012-08-25 (×2): via INTRAVENOUS
  Filled 2012-08-24 (×2): qty 1000

## 2012-08-24 MED ORDER — OXYCODONE HCL ER 20 MG PO T12A
80.0000 mg | EXTENDED_RELEASE_TABLET | Freq: Two times a day (BID) | ORAL | Status: DC
  Administered 2012-08-25 – 2012-08-29 (×5): 80 mg via ORAL
  Filled 2012-08-24 (×8): qty 4

## 2012-08-24 MED ORDER — ALBUTEROL SULFATE (5 MG/ML) 0.5% IN NEBU: 2.5000 mg | INHALATION_SOLUTION | Freq: Four times a day (QID) | RESPIRATORY_TRACT | Status: DC | PRN

## 2012-08-24 MED ORDER — LEVOTHYROXINE SODIUM 50 MCG PO TABS
50.0000 ug | ORAL_TABLET | Freq: Every day | ORAL | Status: DC
  Administered 2012-08-26 – 2012-08-29 (×4): 50 ug via ORAL
  Filled 2012-08-24 (×7): qty 1

## 2012-08-24 MED ORDER — ONDANSETRON HCL 4 MG PO TABS: 4.0000 mg | ORAL_TABLET | Freq: Four times a day (QID) | ORAL | Status: DC | PRN

## 2012-08-24 MED ORDER — SODIUM CHLORIDE 0.9 % IJ SOLN
3.0000 mL | Freq: Two times a day (BID) | INTRAMUSCULAR | Status: DC
  Administered 2012-08-25 – 2012-08-29 (×9): 3 mL via INTRAVENOUS

## 2012-08-24 NOTE — ED Notes (Signed)
Pt c/o abd pain with n/v/d x 24 hours.

## 2012-08-24 NOTE — ED Notes (Addendum)
Pt reports she has abdominal pain 10/10 however appears to be resting quietly.  Pt medicated prior to assessment.  Left hemiparesis noted from CVA.

## 2012-08-24 NOTE — ED Notes (Signed)
Pt transported to [facility] via [place] and stable upon transport.

## 2012-08-24 NOTE — ED Notes (Signed)
[facility] at bedside preparing for transport 

## 2012-08-24 NOTE — Progress Notes (Signed)
Limited US finds no ascites amenable for paracentesis. See PACS/Imaging for details.

## 2012-08-24 NOTE — ED Notes (Signed)
Critical potassium level called from lab, of 2.5, MD made aware

## 2012-08-24 NOTE — ED Notes (Signed)
Report given to [facility], [employer].  ETA 20 minutes.

## 2012-08-24 NOTE — ED Notes (Signed)
Report received from [place], [place] care assumed.

## 2012-08-24 NOTE — ED Provider Notes (Addendum)
History     CSN: [id number]  Arrival date & time 08/24/12  5427   None     Chief Complaint  Patient presents with  . Abdominal Pain  . Emesis    (Consider location/radiation/quality/duration/timing/severity/associated sxs/prior treatment) HPI Comments: Due to prior stroke pt has speech difficulty and hard to get a full hx.  She is able to answer yes and no questions and one word questions.  Family is not present.  Patient is a 56 y.o. female presenting with cramps. The history is provided by the patient and the EMS personnel. The history is limited by the absence of a caregiver.  Abdominal Cramping The primary symptoms of the illness include abdominal pain, nausea, vomiting and diarrhea. The primary symptoms of the illness do not include fatigue, shortness of breath or dysuria. The current episode started yesterday.  The abdominal pain is generalized. The severity of the abdominal pain is 10/10. Relieved by: unknown. Exacerbated by: unknown.  The vomiting began yesterday.  The diarrhea began yesterday.  Additional symptoms associated with the illness include chills and anorexia. Significant associated medical issues do not include diabetes or liver disease. Associated medical issues comments: prior hx of severe stroke with persistent right sided deficits.  .    Past Medical History  Diagnosis Date  . Seizures   . Hypertension   . Stroke     2007 - R sided weakness  . Asthma   . Diabetes mellitus   . 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 Thyroid disease   . LV dysfunction     Per brother, EF 17% s/p AICD 2007 ([employer]). No hx CAD.    Past Surgical History  Procedure Date  . Cholecystectomy   . Appendectomy   . Pacemaker insertion   . Vena cava filter placement 07/19/2012    Procedure: INSERTION VENA-CAVA FILTER;  Surgeon: Larina Earthly, MD;  Location: [facility] OR;  Service: Vascular;  Laterality: [name];    Family History  Problem Relation Age of Onset  . Coronary  artery disease Neg Hx     History  Substance Use Topics  . Smoking status: Former Games developer  . Smokeless tobacco: Not on file  . Alcohol Use: No    OB History    Grav Para Term Preterm Abortions TAB SAB Ect Mult Living                  Review of Systems  Unable to perform ROS Constitutional: Positive for chills. Negative for fatigue.  Respiratory: Negative for cough and shortness of breath.   Gastrointestinal: Positive for nausea, vomiting, abdominal pain, diarrhea and anorexia.  Genitourinary: Negative for dysuria.  All other systems reviewed and are negative.    Allergies  Penicillins and Sulfa antibiotics  Home Medications   Current Outpatient Rx  Name  Route  Sig  Dispense  Refill  . ACETAMINOPHEN ER 650 MG PO TBCR   Oral   Take 650 mg by mouth every 8 (eight) hours as needed. For pain         . ALBUTEROL SULFATE (2.5 MG/3ML) 0.083% IN NEBU   Nebulization   Take 2.5 mg by nebulization every 6 (six) hours as needed. For cough or wheeze         . ALPRAZOLAM 0.25 MG PO TABS   Oral   Take 0.25 mg by mouth at bedtime as needed. For sleep         . DIGOXIN 0.125 MG  PO TABS   Oral   Take 0.125 mg by mouth daily.         Marland Kitchen ESCITALOPRAM OXALATE 10 MG PO TABS   Oral   Take 10 mg by mouth daily.         . FUROSEMIDE 40 MG PO TABS   Oral   Take 1 tablet (40 mg total) by mouth daily.   30 tablet   1   . GLIPIZIDE ER 5 MG PO TB24   Oral   Take 1 tablet (5 mg total) by mouth daily.   30 tablet   1   . LEVETIRACETAM 500 MG PO TABS   Oral   Take 1,000 mg by mouth 2 (two) times daily.         Marland Kitchen LEVOTHYROXINE SODIUM 50 MCG PO TABS   Oral   Take 50 mcg by mouth daily.         Marland Kitchen LISINOPRIL 5 MG PO TABS   Oral   Take 5 mg by mouth daily.         Marland Kitchen METOPROLOL TARTRATE 25 MG PO TABS   Oral   Take 25 mg by mouth 2 (two) times daily.         Marland Kitchen OMEPRAZOLE 20 MG PO CPDR   Oral   Take 20 mg by mouth 2 (two) times daily.         . OXYCODONE  HCL ER 80 MG PO TB12   Oral   Take 80 mg by mouth every 12 (twelve) hours.         . OXYCODONE-ACETAMINOPHEN 10-325 MG PO TABS   Oral   Take 1 tablet by mouth 3 (three) times daily as needed for pain. For pain   15 tablet   0   . QUETIAPINE FUMARATE ER 50 MG PO TB24   Oral   Take 50 mg by mouth daily. At 6pm         . RIVAROXABAN 15 MG PO TABS   Oral   Take 1 tablet (15 mg total) by mouth 2 (two) times daily with a meal.   42 tablet   0   . SPIRONOLACTONE 25 MG PO TABS   Oral   Take 25 mg by mouth daily.           There were no vitals taken for this visit.  Physical Exam  Nursing note and vitals reviewed. Constitutional: She is oriented to person, place, and time. She appears well-developed and well-nourished. She appears distressed.  HENT:  Head: Normocephalic and atraumatic.  Mouth/Throat: Oropharynx is clear and moist. Mucous membranes are dry.  Eyes: Conjunctivae normal and EOM are normal. Pupils are equal, round, and reactive to light.  Neck: Normal range of motion. Neck supple.  Cardiovascular: Regular rhythm and intact distal pulses.  Tachycardia present.   No murmur heard. Pulmonary/Chest: Effort normal and breath sounds normal. No respiratory distress. She has no wheezes. She has no rales.  Abdominal: Soft. She exhibits ascites. She exhibits no distension. There is generalized tenderness. There is guarding. There is no rebound and no CVA tenderness.  Musculoskeletal: Normal range of motion. She exhibits no edema and no tenderness.  Neurological: She is alert and oriented to person, place, and time. A sensory deficit is present.       Right sided hemiparesis  Skin: Skin is warm and dry. No rash noted. No erythema.  Psychiatric: She has a normal mood and affect. Her behavior is normal.  ED Course  Procedures (including critical care time)  Labs Reviewed  CBC WITH DIFFERENTIAL - Abnormal; Notable for the following:    Hemoglobin 10.5 (*)     HCT 33.2  (*)     RDW 25.0 (*)     All other components within normal limits  COMPREHENSIVE METABOLIC PANEL - Abnormal; Notable for the following:    Potassium 2.5 (*)     Albumin 3.0 (*)     Total Bilirubin 2.3 (*)     All other components within normal limits  LIPASE, BLOOD - Abnormal; Notable for the following:    Lipase 5 (*)     All other components within normal limits  URINALYSIS, ROUTINE W REFLEX MICROSCOPIC - Abnormal; Notable for the following:    Color, Urine ORANGE (*)  BIOCHEMICALS MAY BE AFFECTED BY COLOR   APPearance CLOUDY (*)     Bilirubin Urine LARGE (*)     Ketones, ur 15 (*)     Protein, ur >300 (*)     Urobilinogen, UA 4.0 (*)     Nitrite POSITIVE (*)     Leukocytes, UA SMALL (*)     All other components within normal limits  URINE MICROSCOPIC-ADD ON - Abnormal; Notable for the following:    Bacteria, UA MANY (*)     All other components within normal limits  LACTIC ACID, PLASMA  URINE CULTURE   No results found.   Date: 08/24/2012  Rate: 100  Rhythm: sinus tachycardia  QRS Axis: normal  Intervals: normal  ST/T Wave abnormalities: ST and T wave nonspecific changes  Conduction Disutrbances:none  Narrative Interpretation:   Old EKG Reviewed: unchanged   1. Hypokalemia   2. Vomiting and diarrhea   3. UTI (lower urinary tract infection)       MDM   Patient with a history of nausea, vomiting, diarrhea and abdominal pain that started approximately 24 hours ago. On exam patient is rocking back and forth in pain and states it hurts everywhere. Unable to localized point tenderness in her abdomen. Difficulty communicating due to prior stroke and speech is slow and slurred.  She denies sick contacts. No recent change in her medications.  Patient was seen at the beginning of November for similar symptoms with lower abdominal pain and had a CT at that time showed moderate ascites marked cardiomegaly and a diarrheal process. Patient has known CHF with an EF of 17% and an  AICD. She has no signs of fluid overload today but will hydrate gently. Concern for viral process versus diverticulitis. Status post cholecystectomy and appendectomy. Pain and nausea medication given. CBC, CMP, lipase, lactate, UA, KUB pending.  6:23 AM On re-eval pt still c/o of abd pain that is diffuse and KUB without evidence of free air.  UA with signs of UTI and normal lactate, lipase and CBC.  CMP with evidence of hypokalemia of 2.5 but o/w normal.  Pt given rocephin for UTI and given IV runs of K and oral potassium.  Due to pt's multiple medical problems, persistent pain and nausea and above findings will admit to obs for correction of K and ensure tolerating po's before d/c home.      Gwyneth Sprout, MD 08/24/12 1610  Gwyneth Sprout, MD 08/24/12 [phone]

## 2012-08-24 NOTE — H&P (Signed)
[facility] History and Physical  Elizabeth Love RUE:[id number] DOB: Apr 19, 1956 DOA: 08/24/2012  Referring physician: None PCP: [name], [name], MD   Chief Complaint: Abdominal pain HPI:  56 year old female with a complicated past medical history including a history of prior stroke, associated seizure disorder, last tonic-clonic seizure more than a year ago and absence seizures every 2 weeks, who presents with intractable nausea vomiting related to urinary tract infection. She has also had diarrhea and her symptoms started approximately 24 hours ago. On exam patient is rocking back and forth in pain and states it hurts everywhere and moaning and groaning in pain. Unable to localized point tenderness in her abdomen. Difficulty communicating due to prior stroke and speech is slow and slurred. She denies sick contacts. No recent change in her medications. Patient was seen at the beginning of November for similar symptoms with lower abdominal pain and had a CT at that time showed moderate ascites marked cardiomegaly ,. Patient has known CHF with an EF of 17% and an AICD. She has no signs of fluid overload today but will hydrate gently. Concern for viral process versus diverticulitis. Status post cholecystectomy and appendectomy.       Review of Systems: negative for the following  Constitutional: Denies fever, chills, diaphoresis, appetite change and fatigue.  HEENT: Denies photophobia, eye pain, redness, hearing loss, ear pain, congestion, sore throat, rhinorrhea, sneezing, mouth sores, trouble swallowing, neck pain, neck stiffness and tinnitus.  Respiratory: Denies SOB, DOE, cough, chest tightness, and wheezing.  Cardiovascular: Denies chest pain, palpitations and leg swelling.  Gastrointestinal: Denies nausea, vomiting, abdominal pain, diarrhea, constipation, blood in stool and abdominal distention.  Genitourinary: Denies dysuria, urgency, frequency, hematuria, flank pain and  difficulty urinating.  Musculoskeletal: Denies myalgias, back pain, joint swelling, arthralgias and gait problem.  Skin: Denies pallor, rash and wound.  Neurological: Denies dizziness, seizures, syncope, weakness, light-headedness, numbness and headaches.  Hematological: Denies adenopathy. Easy bruising, personal or family bleeding history  Psychiatric/Behavioral: Denies suicidal ideation, mood changes, confusion, nervousness, sleep disturbance and agitation       Past Medical History  Diagnosis Date  . Seizures   . Hypertension   . Stroke     2007 - R sided weakness  . Asthma   . Diabetes mellitus   . 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 Thyroid disease   . LV dysfunction     Per brother, EF 17% s/p AICD 2007 ([employer]). No hx CAD.     Past Surgical History  Procedure Date  . Cholecystectomy   . Appendectomy   . Pacemaker insertion   . Vena cava filter placement 07/19/2012    Procedure: INSERTION VENA-CAVA FILTER;  Surgeon: Larina Earthly, MD;  Location: [facility] OR;  Service: Vascular;  Laterality: [name];      Social History:  reports that she has quit smoking. She does not have any smokeless tobacco history on file. She reports that she does not drink alcohol or use illicit drugs.    Allergies  Allergen Reactions  . Penicillins Rash  . Sulfa Antibiotics Rash    Family History  Problem Relation Age of Onset  . Coronary artery disease Neg Hx      Prior to Admission medications   Medication Sig Start Date End Date Taking? Authorizing Provider  acetaminophen (TYLENOL) 650 MG CR tablet Take 650 mg by mouth every 8 (eight) hours as needed. For pain    Historical Provider, MD  albuterol (PROVENTIL) (  2.5 MG/3ML) 0.083% nebulizer solution Take 2.5 mg by nebulization every 6 (six) hours as needed. For cough or wheeze    Historical Provider, MD  ALPRAZolam (XANAX) 0.25 MG tablet Take 0.25 mg by mouth at bedtime as needed. For sleep    Historical Provider, MD  digoxin  (LANOXIN) 0.125 MG tablet Take 0.125 mg by mouth daily.    Historical Provider, MD  escitalopram (LEXAPRO) 10 MG tablet Take 10 mg by mouth daily.    Historical Provider, MD  furosemide (LASIX) 40 MG tablet Take 1 tablet (40 mg total) by mouth daily. 04/25/12 04/25/13  Joseph Art, DO  glipiZIDE (GLUCOTROL XL) 5 MG 24 hr tablet Take 1 tablet (5 mg total) by mouth daily. 07/22/12   Catarina Hartshorn, MD  levETIRAcetam (KEPPRA) 500 MG tablet Take 1,000 mg by mouth 2 (two) times daily.    Historical Provider, MD  levothyroxine (SYNTHROID, LEVOTHROID) 50 MCG tablet Take 50 mcg by mouth daily.    Historical Provider, MD  lisinopril (PRINIVIL,ZESTRIL) 5 MG tablet Take 5 mg by mouth daily.    Historical Provider, MD  metoprolol tartrate (LOPRESSOR) 25 MG tablet Take 25 mg by mouth 2 (two) times daily.    Historical Provider, MD  omeprazole (PRILOSEC) 20 MG capsule Take 20 mg by mouth 2 (two) times daily.    Historical Provider, MD  oxyCODONE (OXYCONTIN) 80 MG 12 hr tablet Take 80 mg by mouth every 12 (twelve) hours.    Historical Provider, MD  oxyCODONE-acetaminophen (PERCOCET) 10-325 MG per tablet Take 1 tablet by mouth 3 (three) times daily as needed for pain. For pain 04/25/12   Joseph Art, DO  QUEtiapine (SEROQUEL XR) 50 MG TB24 Take 50 mg by mouth daily. At 6pm    Historical Provider, MD  Rivaroxaban (XARELTO) 15 MG TABS tablet Take 1 tablet (15 mg total) by mouth 2 (two) times daily with a meal. 07/22/12   Catarina Hartshorn, MD  spironolactone (ALDACTONE) 25 MG tablet Take 25 mg by mouth daily.    Historical Provider, MD     Physical Exam: Filed Vitals:   08/24/12 0524 08/24/12 0732 08/24/12 0858  BP: 118/78 127/76 133/84  Pulse: 106 106 99  Temp: 97.7 F (36.5 C)  98.2 F (36.8 C)  TempSrc: Oral  Oral  Resp: 20 18 20   SpO2: 96% 95% 97%    General: Pt in NAD, Alert and Awake  Eyes: EOMI, non icteric  ENT: no masses on visual inspection, normal exterior appearance  Neck: supple, no goiter   Cardiovascular: S1 and S2, no murmurs  Respiratory: CTA BL, no wheezes or increased breathing  Abdomen: soft, NT  Skin: warm, dry  Musculoskeletal: no cyanosis or clubbing  Psychiatric: unable to properly assess due to h/o CVA and aphasia  Neurologic: Right hemiplegia, gazes and responds to examiner with limited responses and difficulty obtaining words       Labs on Admission:    Basic Metabolic Panel:  Lab 08/24/12 1610  NA 145  K 2.5*  CL 106  CO2 25  GLUCOSE 90  BUN 9  CREATININE 0.70  CALCIUM 9.0  MG --  PHOS --   Liver Function Tests:  Lab 08/24/12 0533  AST 21  ALT 9  ALKPHOS 69  BILITOT 2.3*  PROT 7.3  ALBUMIN 3.0*    Lab 08/24/12 0533  LIPASE 5*  AMYLASE --   No results found for this basename: AMMONIA:5 in the last 168 hours CBC:  Lab 08/24/12 0533  WBC 7.8  NEUTROABS 5.5  HGB 10.5*  HCT 33.2*  MCV 82.4  PLT 224   Cardiac Enzymes: No results found for this basename: CKTOTAL:5,CKMB:5,CKMBINDEX:5,TROPONINI:5 in the last 168 hours  BNP (last 3 results)  Basename 07/18/12 0240 04/23/12 0232  PROBNP 6251.0* 3629.0*      CBG:  Lab 08/24/12 0900  GLUCAP 132*    Radiological Exams on Admission: Dg Abd 1 View  08/24/2012  *RADIOLOGY REPORT*  Clinical Data: Abdominal pain, nausea, vomiting.  ABDOMEN - 1 VIEW  Comparison: 07/19/2012  Findings: Nonobstructive bowel gas pattern.  No supine evidence of free air.  IVC filter remains in place, unchanged.  Prior cholecystectomy.  No organomegaly or suspicious calcification.  No acute bony abnormality.  IMPRESSION: No acute findings.   Original Report Authenticated By: Charlett Nose, M.D.     EKG: Date: 08/24/2012  Rate: 100  Rhythm: sinus tachycardia  QRS Axis: normal  Intervals: normal  ST/T Wave abnormalities: ST and T wave nonspecific changes  Conduction Disutrbances:none  Narrative Interpretation:  Old EKG Reviewed: unchanged    Assessment/Plan Active Problems:  Aphasia as late  effect of cerebrovascular accident  Atrial fibrillation  Hypothyroidism  Thrombocytopenia  Cardiomyopathy  DVT of leg (deep venous thrombosis)  Seizure disorder  Hypokalemia  UTI (lower urinary tract infection)  Vomiting and diarrhea   1. Nausea vomiting diarrhea likely related to urinary tract infection, the patient also has moderate abdominal ascites, need to rule out SBP , the patient has been started on Rocephin for her urinary tract infection, will schedule an ultrasound-guided R/O  cdiff  2. 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 History of atrial fibrillation Will continue beta blocker for rate control 4. History of cardiomyopathy continue outpatient medications, get CXR , she is wheezing ,check BNP  5. Hypothyroidism continue Synthroid 6. Diabetes continue glipizide and start the patient on sliding scale insulin 7. Abdominal pain liver function tests and lipase normal bilirubin mildly elevated, will need paracentesis to rule out SBP. We'll also do an abdominal ultrasound to evaluate for CBD stone, she status post cholecystectomy and appendectomy  Code Status:   full Family Communication: bedside Disposition Plan: admit   Time spent: 70 mins   [facility] Pager [phone]  If 7PM-7AM, please contact night-coverage [email or web address] Password [facility] 08/24/2012, 9:33 AM

## 2012-08-24 NOTE — Progress Notes (Signed)
Request for admission, pt with N/V, diarrhea, hypokalemia 2.5, history of cardiac issues, also ne UTI. Tele floor requested.   Debbora Presto, MD  [facility] Pager [phone]  If 7PM-7AM, please contact night-coverage [email or web address] Password [email or web address]

## 2012-08-25 ENCOUNTER — Inpatient Hospital Stay (HOSPITAL_COMMUNITY): Payer: Medicare Other

## 2012-08-25 DIAGNOSIS — D6859 Other primary thrombophilia: Secondary | ICD-10-CM | POA: Diagnosis present

## 2012-08-25 DIAGNOSIS — I69920 Aphasia following unspecified cerebrovascular disease: Secondary | ICD-10-CM

## 2012-08-25 DIAGNOSIS — I82409 Acute embolism and thrombosis of unspecified deep veins of unspecified lower extremity: Secondary | ICD-10-CM

## 2012-08-25 DIAGNOSIS — G8929 Other chronic pain: Secondary | ICD-10-CM

## 2012-08-25 DIAGNOSIS — M549 Dorsalgia, unspecified: Secondary | ICD-10-CM

## 2012-08-25 LAB — COMPREHENSIVE METABOLIC PANEL
Alkaline Phosphatase: 77 U/L (ref 39–117)
BUN: 12 mg/dL (ref 6–23)
GFR calc Af Amer: 72 mL/min — ABNORMAL LOW (ref >90)
GFR calc non Af Amer: 62 mL/min — ABNORMAL LOW (ref >90)
Glucose, Bld: 147 mg/dL — ABNORMAL HIGH (ref 70–99)
Potassium: 4.2 mEq/L (ref 3.5–5.1)
Total Protein: 7.1 g/dL (ref 6.0–8.3)

## 2012-08-25 LAB — GLUCOSE, CAPILLARY: Glucose-Capillary: 169 mg/dL — ABNORMAL HIGH (ref 70–99)

## 2012-08-25 LAB — CBC
HCT: 34.8 % — ABNORMAL LOW (ref 36.0–46.0)
Platelets: 215 10*3/uL (ref 150–400)
RDW: 24.1 % — ABNORMAL HIGH (ref 11.5–15.5)
WBC: 9 10*3/uL (ref 4.0–10.5)

## 2012-08-25 MED ORDER — RIVAROXABAN 20 MG PO TABS
20.0000 mg | ORAL_TABLET | Freq: Every day | ORAL | Status: DC
  Administered 2012-08-26 – 2012-08-28 (×3): 20 mg via ORAL
  Filled 2012-08-25 (×4): qty 1

## 2012-08-25 MED ORDER — ALPRAZOLAM 0.5 MG PO TABS
0.5000 mg | ORAL_TABLET | Freq: Every evening | ORAL | Status: DC | PRN
  Administered 2012-08-25: 0.5 mg via ORAL
  Filled 2012-08-25: qty 1

## 2012-08-25 MED ORDER — AMIODARONE HCL 200 MG PO TABS
200.0000 mg | ORAL_TABLET | Freq: Every day | ORAL | Status: DC
  Administered 2012-08-25 – 2012-08-26 (×2): 200 mg via ORAL
  Filled 2012-08-25 (×3): qty 1

## 2012-08-25 NOTE — Progress Notes (Signed)
[facility] PROGRESS NOTE  Elizabeth Love WUJ:[id number] DOB: December 12, 1955 DOA: 08/24/2012 PCP: [name], [name], MD  Assessment/Plan: 1. N/V/D/ Abd pain - ? Cause - ? Gastroenteritis vs  maybe from UTI - AXR negative for impaction or obstruction  2. Chronic systolic CHF - seems to be getting euvolemic. Able to take pos. Given low EF would stop iv fluids and monitor  3. Stroke, chronic aphasia, - getting closer to baseline    4. Seizure ds - cont Keppra.  5. UTI - await culture . Started empiric Rocephin on admission.  6. Hx of RLE DVT 08/25/12 - s/p IVC filter  7. S/p AICD 8. DM type 2 - SSI for now.  9. Hypokalemia - from N/V/D - resolved with iv fluids   Code Status: full Family Communication: son Disposition Plan: home      Antibiotics: Rocephin   HPI/Subjective: Fells slightly better   Objective: Filed Vitals:   08/24/12 2029 08/24/12 2147 08/25/12 0442 08/25/12 1034  BP: 121/84  120/85 124/82  Pulse: 85  88 85  Temp: 97.7 F (36.5 C)  97.8 F (36.6 C) 97.9 F (36.6 C)  TempSrc: Tympanic  Oral Oral  Resp: 17  18 18   Weight:  58 kg (127 lb 13.9 oz)    SpO2: 92%  92% 94%    Intake/Output Summary (Last 24 hours) at 08/25/12 1156 Last data filed at 08/25/12 0900  Gross per 24 hour  Intake   1124 ml  Output      0 ml  Net   1124 ml   Filed Weights   08/24/12 2147  Weight: 58 kg (127 lb 13.9 oz)    Exam:   General: alert, aphasic, trying to speak   Cardiovascular: RRR, S3 gallop  Respiratory: CTAB  Abdomen: soft, nt   Data Reviewed: Basic Metabolic Panel:  Lab 08/25/12 9562 08/24/12 1849 08/24/12 1215 08/24/12 0533  NA 142 143 -- 145  K 4.2 3.9 -- 2.5*  CL 107 107 -- 106  CO2 21 24 -- 25  GLUCOSE 147* 123* -- 90  BUN 12 11 -- 9  CREATININE 1.01 0.88 -- 0.70  CALCIUM 8.5 8.8 -- 9.0  MG -- -- 1.6 --  PHOS -- -- -- --   Liver Function Tests:  Lab 08/25/12 0505 08/24/12 1215 08/24/12 0533  AST 25 -- 21  ALT 12 -- 9  ALKPHOS  77 -- 69  BILITOT 1.7* -- 2.3*  PROT 7.1 7.1 7.3  ALBUMIN 2.8* -- 3.0*    Lab 08/24/12 0533  LIPASE 5*  AMYLASE --   No results found for this basename: AMMONIA:5 in the last 168 hours CBC:  Lab 08/25/12 0505 08/24/12 0533  WBC 9.0 7.8  NEUTROABS -- 5.5  HGB 10.3* 10.5*  HCT 34.8* 33.2*  MCV 87.4 82.4  PLT 215 224   Cardiac Enzymes:  Lab 08/24/12 2130 08/24/12 1849 08/24/12 1215  CKTOTAL -- -- --  CKMB -- -- --  CKMBINDEX -- -- --  TROPONINI <0.30 <0.30 <0.30   BNP (last 3 results)  Basename 08/25/12 0505 07/18/12 0240 04/23/12 0232  PROBNP 17029.0* 6251.0* 3629.0*   CBG:  Lab 08/25/12 0733 08/24/12 2145 08/24/12 1641 08/24/12 1142 08/24/12 0900  GLUCAP 131* 167* 125* 143* 132*    No results found for this or any previous visit (from the past 240 hour(s)).   Studies: Dg Chest 2 View  08/24/2012  *RADIOLOGY REPORT*  Clinical Data: Wheezing.  Ex-smoker.  CHEST -  2 VIEW  Comparison: 07/20/2012.  Findings: Stable left subclavian pacer and AICD leads.  Stable enlarged cardiac silhouette and prominent pulmonary vasculature and interstitial markings.  Small bilateral pleural effusions. Unremarkable bones.  IMPRESSION: Stable cardiomegaly mild changes of congestive heart failure.   Original Report Authenticated By: Beckie Salts, M.D.    Dg Abd 1 View  08/24/2012  *RADIOLOGY REPORT*  Clinical Data: Abdominal pain, nausea, vomiting.  ABDOMEN - 1 VIEW  Comparison: 07/19/2012  Findings: Nonobstructive bowel gas pattern.  No supine evidence of free air.  IVC filter remains in place, unchanged.  Prior cholecystectomy.  No organomegaly or suspicious calcification.  No acute bony abnormality.  IMPRESSION: No acute findings.   Original Report Authenticated By: Charlett Nose, M.D.    US Abdomen Limited  08/24/2012  *RADIOLOGY REPORT*  Clinical Data: Abdominal pain, nausea and vomiting, request for paracentesis  US ABDOMEN LIMITED  Comparison:  None  An ultrasound guided paracentesis was  thoroughly discussed with the patient and questions answered.  The benefits, risks, alternatives and complications were also discussed.  The patient understands and wishes to proceed with the procedure.  Written consent was obtained.  Findings: Ultrasound imaging of the abdomen finds only very small amount of perihepatic fluid, not amenable for paracentesis.  No other ascites noted. No procedure performed.  IMPRESSION: Trace perihepatic ascites not suitable for paracentesis.  Read by Brayton El PA-C   Original Report Authenticated By: Richarda Overlie, M.D.    Dg Abd Portable 1v  08/25/2012  *RADIOLOGY REPORT*  Clinical Data: 56 year old female with abdominal pain and vomiting.  PORTABLE ABDOMEN - 1 VIEW  Comparison: 08/24/2012 radiographs  Findings: The bowel gas pattern is normal. No dilated bowel loops are present. No suspicious calcifications are noted. An IVC filter is again noted. No acute bony abnormalities are present.  IMPRESSION: No evidence of acute abnormality - unremarkable bowel gas pattern.   Original Report Authenticated By: Harmon Pier, M.D.     Scheduled Meds:    . amiodarone  200 mg Oral Daily  . cefTRIAXone (ROCEPHIN)  IV  1 g Intravenous Q24H  . digoxin  0.125 mg Oral Daily  . escitalopram  10 mg Oral Daily  . insulin aspart  0-15 Units Subcutaneous TID WC  . levETIRAcetam  1,000 mg Oral BID  . levothyroxine  50 mcg Oral QAC breakfast  . metoprolol tartrate  25 mg Oral BID  . OxyCODONE  80 mg Oral Q12H  . pantoprazole  40 mg Oral Daily  . [EXPIRED] potassium chloride  10 mEq Intravenous Q1 Hr x 4  . QUEtiapine  50 mg Oral Daily  . Rivaroxaban  15 mg Oral BID WC  . sodium chloride  3 mL Intravenous Q12H  . spironolactone  25 mg Oral Daily  . [DISCONTINUED] albuterol  2.5 mg Nebulization Q6H  . [DISCONTINUED] glipiZIDE  5 mg Oral Q breakfast  . [DISCONTINUED] lisinopril  5 mg Oral Daily   Continuous Infusions:    . [DISCONTINUED] 0.9 % NaCl with KCl 40 mEq / L 50 mL/hr at  08/25/12 0416    Principal Problem:  *Vomiting and diarrhea Active Problems:  Aphasia as late effect of cerebrovascular accident  Chronic systolic heart failure  Atrial fibrillation  Hypothyroidism  Thrombocytopenia  Cardiomyopathy  DVT of leg (deep venous thrombosis)  Seizure disorder  Hypokalemia  UTI (lower urinary tract infection)  Protein C deficiency   Theordore Cisnero  [facility] Pager [phone]. If 8PM-8AM, please contact night-coverage at [email or web address], password [facility] 08/25/2012,  11:56 AM  LOS: 1 day

## 2012-08-26 DIAGNOSIS — I4891 Unspecified atrial fibrillation: Secondary | ICD-10-CM

## 2012-08-26 LAB — GLUCOSE, CAPILLARY
Glucose-Capillary: 92 mg/dL (ref 70–99)
Glucose-Capillary: 127 mg/dL — ABNORMAL HIGH (ref 70–99)
Glucose-Capillary: 129 mg/dL — ABNORMAL HIGH (ref 70–99)

## 2012-08-26 NOTE — Progress Notes (Signed)
[facility] PROGRESS NOTE  Elizabeth Love ZOX:[id number] DOB: Jul 09, 1956 DOA: 08/24/2012 PCP: Elizabeth Love, Elizabeth Dach, MD  Assessment/Plan: 1. N/V/D/ Abd pain - ? Cause - ? Gastroenteritis vs  maybe from UTI - AXR negative for impaction or obstruction . Patient ha snot vomited or had diarrhea in the hospital. Diet advanced to D3 on 08/26/12.  2. Chronic systolic CHF - seems to be getting euvolemic. Able to take pos. Given low EF would stop iv fluids and monitor  3. Stroke, chronic aphasia, - getting closer to baseline    4. Seizure ds - cont Keppra.  5. UTI - urine culture was ordered on admission but never done.. Started empiric Rocephin on admission.  6. Hx of RLE DVT 07/26/12 - patient with protein C deficiency  - s/p IVC filter , on therapy with Xarelto  7. S/p AICD 8. DM type 2 - SSI for now.  9. Hypokalemia - from N/V/D - resolved with iv fluids 10. Chronic pain syndrome resumed BID oxycontin    Code Status: full Family Communication: brother Elizabeth Love  Disposition Plan: home      Antibiotics: Rocephin 12/7  HPI/Subjective: Fells slightly better   Objective: Filed Vitals:   08/25/12 1800 08/25/12 2039 08/26/12 0243 08/26/12 0504  BP: 122/78 118/86 126/76 121/81  Pulse: 84 89 82 90  Temp: 98 F (36.7 C) 97.7 F (36.5 C)  98.6 F (37 C)  TempSrc: Oral Oral  Oral  Resp: 18 18  16   Weight:  58.6 kg (129 lb 3 oz)    SpO2: 96% 96%  96%    Intake/Output Summary (Last 24 hours) at 08/26/12 0831 Last data filed at 08/26/12 0600  Gross per 24 hour  Intake    652 ml  Output      0 ml  Net    652 ml   Filed Weights   08/24/12 2147 08/25/12 2039  Weight: 58 kg (127 lb 13.9 oz) 58.6 kg (129 lb 3 oz)    Exam:   General: alert, aphasic, trying to speak   Cardiovascular: RRR, S3 gallop  Respiratory: CTAB  Abdomen: soft, nt   Data Reviewed: Basic Metabolic Panel:  Lab 08/25/12 8119 08/24/12 1849 08/24/12 1215 08/24/12 0533  NA 142 143 -- 145  K 4.2 3.9  -- 2.5*  CL 107 107 -- 106  CO2 21 24 -- 25  GLUCOSE 147* 123* -- 90  BUN 12 11 -- 9  CREATININE 1.01 0.88 -- 0.70  CALCIUM 8.5 8.8 -- 9.0  MG -- -- 1.6 --  PHOS -- -- -- --   Liver Function Tests:  Lab 08/25/12 0505 08/24/12 1215 08/24/12 0533  AST 25 -- 21  ALT 12 -- 9  ALKPHOS 77 -- 69  BILITOT 1.7* -- 2.3*  PROT 7.1 7.1 7.3  ALBUMIN 2.8* -- 3.0*    Lab 08/24/12 0533  LIPASE 5*  AMYLASE --   No results found for this basename: AMMONIA:5 in the last 168 hours CBC:  Lab 08/25/12 0505 08/24/12 0533  WBC 9.0 7.8  NEUTROABS -- 5.5  HGB 10.3* 10.5*  HCT 34.8* 33.2*  MCV 87.4 82.4  PLT 215 224   Cardiac Enzymes:  Lab 08/24/12 2130 08/24/12 1849 08/24/12 1215  CKTOTAL -- -- --  CKMB -- -- --  CKMBINDEX -- -- --  TROPONINI <0.30 <0.30 <0.30   BNP (last 3 results)  Basename 08/25/12 0505 07/18/12 0240 04/23/12 0232  PROBNP 17029.0* 6251.0* 3629.0*   CBG:  Lab  08/26/12 0732 08/25/12 2036 08/25/12 1626 08/25/12 1138 08/25/12 0733  GLUCAP 92 103* 169* 175* 131*    No results found for this or any previous visit (from the past 240 hour(s)).   Studies: Dg Chest 2 View  08/24/2012  *RADIOLOGY REPORT*  Clinical Data: Wheezing.  Ex-smoker.  CHEST - 2 VIEW  Comparison: 07/20/2012.  Findings: Stable left subclavian pacer and AICD leads.  Stable enlarged cardiac silhouette and prominent pulmonary vasculature and interstitial markings.  Small bilateral pleural effusions. Unremarkable bones.  IMPRESSION: Stable cardiomegaly mild changes of congestive heart failure.   Original Report Authenticated By: Elizabeth Love, M.D.    US Abdomen Limited  08/24/2012  *RADIOLOGY REPORT*  Clinical Data: Abdominal pain, nausea and vomiting, request for paracentesis  US ABDOMEN LIMITED  Comparison:  None  An ultrasound guided paracentesis was thoroughly discussed with the patient and questions answered.  The benefits, risks, alternatives and complications were also discussed.  The patient  understands and wishes to proceed with the procedure.  Written consent was obtained.  Findings: Ultrasound imaging of the abdomen finds only very small amount of perihepatic fluid, not amenable for paracentesis.  No other ascites noted. No procedure performed.  IMPRESSION: Trace perihepatic ascites not suitable for paracentesis.  Read by Elizabeth Love Elizabeth Love   Original Report Authenticated By: Elizabeth Love, M.D.    Dg Abd Portable 1v  08/25/2012  *RADIOLOGY REPORT*  Clinical Data: 56 year old female with abdominal pain and vomiting.  PORTABLE ABDOMEN - 1 VIEW  Comparison: 08/24/2012 radiographs  Findings: The bowel gas pattern is normal. No dilated bowel loops are present. No suspicious calcifications are noted. An IVC filter is again noted. No acute bony abnormalities are present.  IMPRESSION: No evidence of acute abnormality - unremarkable bowel gas pattern.   Original Report Authenticated By: Elizabeth Love, M.D.     Scheduled Meds:    . amiodarone  200 mg Oral Daily  . cefTRIAXone (ROCEPHIN)  IV  1 g Intravenous Q24H  . digoxin  0.125 mg Oral Daily  . escitalopram  10 mg Oral Daily  . insulin aspart  0-15 Units Subcutaneous TID WC  . levETIRAcetam  1,000 mg Oral BID  . levothyroxine  50 mcg Oral QAC breakfast  . metoprolol tartrate  25 mg Oral BID  . OxyCODONE  80 mg Oral Q12H  . pantoprazole  40 mg Oral Daily  . QUEtiapine  50 mg Oral Daily  . rivaroxaban  20 mg Oral Q supper  . sodium chloride  3 mL Intravenous Q12H  . spironolactone  25 mg Oral Daily  . [DISCONTINUED] Rivaroxaban  15 mg Oral BID WC   Continuous Infusions:    Principal Problem:  *Vomiting and diarrhea Active Problems:  Aphasia as late effect of cerebrovascular accident  Chronic systolic heart failure  Atrial fibrillation  Hypothyroidism  Thrombocytopenia  Cardiomyopathy  DVT of leg (deep venous thrombosis)  Seizure disorder  Hypokalemia  UTI (lower urinary tract infection)  Protein C  deficiency   Elizabeth Love  [facility] Pager [phone]. If 8PM-8AM, please contact night-coverage at [email or web address], password [facility] 08/26/2012, 8:31 AM  LOS: 2 days

## 2012-08-26 NOTE — Progress Notes (Signed)
Utilization review completed.  

## 2012-08-26 NOTE — Progress Notes (Signed)
16 beat run of VT. NP made aware. Pt asymptomatic. Vitals stable.

## 2012-08-27 ENCOUNTER — Inpatient Hospital Stay (HOSPITAL_COMMUNITY): Payer: Medicare Other

## 2012-08-27 DIAGNOSIS — I428 Other cardiomyopathies: Secondary | ICD-10-CM

## 2012-08-27 DIAGNOSIS — I472 Ventricular tachycardia: Secondary | ICD-10-CM

## 2012-08-27 LAB — URINE CULTURE: Colony Count: >=100000

## 2012-08-27 MED ORDER — AMIODARONE HCL 200 MG PO TABS
400.0000 mg | ORAL_TABLET | Freq: Every day | ORAL | Status: DC
  Administered 2012-08-28: 400 mg via ORAL
  Filled 2012-08-27 (×2): qty 2

## 2012-08-27 MED ORDER — METOPROLOL TARTRATE 50 MG PO TABS
50.0000 mg | ORAL_TABLET | Freq: Two times a day (BID) | ORAL | Status: DC
  Administered 2012-08-28 (×3): 50 mg via ORAL
  Filled 2012-08-27 (×5): qty 1

## 2012-08-27 MED ORDER — SODIUM CHLORIDE 0.9 % IV SOLN
1000.0000 mg | Freq: Two times a day (BID) | INTRAVENOUS | Status: DC
  Administered 2012-08-27 – 2012-08-29 (×5): 1000 mg via INTRAVENOUS
  Filled 2012-08-27 (×6): qty 10

## 2012-08-27 MED FILL — Potassium Chloride Inj 10 mEq/100ML: INTRAVENOUS | Qty: 100 | Status: AC

## 2012-08-27 MED FILL — Ondansetron HCl Inj 4 MG/2ML (2 MG/ML): INTRAMUSCULAR | Qty: 2 | Status: AC

## 2012-08-27 MED FILL — Hydromorphone HCl Preservative Free (PF) Inj 10 MG/ML: INTRAMUSCULAR | Qty: 1 | Status: AC

## 2012-08-27 NOTE — Progress Notes (Signed)
Monitor tech informs the nurse that the patient experienced 11 beats of V-tach. Nurse checks in on patient and she was asymptomatic. Harmon Pier

## 2012-08-27 NOTE — Progress Notes (Addendum)
[facility] PROGRESS NOTE  Elizabeth Love ZOX:[id number] DOB: [birth year] DOA: 08/24/2012 PCP: [name], [name], MD  Assessment/Plan: 1. N/V/D/ Abd pain - ? Cause - ? Gastroenteritis vs  maybe from UTI - AXR negative for impaction or obstruction . Patient has not vomited or had diarrhea in the hospital. Diet advanced to D3 on 08/26/12.  2. Worsening mental status on the morning of December 10 - the nurse reported the patient was difficult to arouse as the day went on the patient became more arousable and returning back to her prior functional status - the clinical scenario is highly suggestive of a breakthrough seizure. CT head obtained on December 10 was negative for hemorrhage or acute stroke. We converted Keppra to iv 08/27/12 . May need increased dose keppra - if eeg positive 3. Chronic systolic CHF - seems to be getting euvolemic. Able to take pos. Given low EF we have  stopped  iv fluids after the first 24 hours of treatment  4. Stroke, chronic aphasia, -  5. Seizure ds - patient was continued on  Keppra.  - see also 2 - EEG pending  6. E coli UTI - urine culture still pending . Started empiric Rocephin on admission.  7. Hx of RLE DVT 07/26/12 - patient with protein C deficiency  - s/p IVC filter , on therapy with Xarelto  8. S/p AICD - the device has fired 3 times in the past month - cardiology consult called 08/27/12 - unclear if this is the source of intermittent confusion  9. DM type 2 - SSI for now.  10. Hypokalemia - from N/V/D - resolved with iv fluids 11. Chronic pain syndrome resumed BID oxycontin    Code Status: full Family Communication: brother Dorinda Hill  Disposition Plan: home      Antibiotics: Rocephin 12/7  HPI/Subjective: Had episode of altered mental status - now coming back to stated baseline   Objective: Filed Vitals:   08/26/12 2027 08/27/12 0452 08/27/12 1003 08/27/12 1447  BP: 122/71 122/83 118/79 128/75  Pulse: 86 94 87 97  Temp: 98.4 F (36.9  C) 97.8 F (36.6 C) 98.5 F (36.9 C) 98.4 F (36.9 C)  TempSrc: Oral Oral    Resp: 18 18 17 16   Weight: 57.6 kg (126 lb 15.8 oz)     SpO2: 94% 92% 100% 97%    Intake/Output Summary (Last 24 hours) at 08/27/12 1607 Last data filed at 08/27/12 1003  Gross per 24 hour  Intake    243 ml  Output     50 ml  Net    193 ml   Filed Weights   08/24/12 2147 08/25/12 2039 08/26/12 2027  Weight: 58 kg (127 lb 13.9 oz) 58.6 kg (129 lb 3 oz) 57.6 kg (126 lb 15.8 oz)    Exam:   General: more lethargic   Cardiovascular: RRR, S3 gallop  Respiratory: CTAB, no w,r,c   Abdomen: soft, nt   Data Reviewed: Basic Metabolic Panel:  Lab 08/25/12 8119 08/24/12 1849 08/24/12 1215 08/24/12 0533  NA 142 143 -- 145  K 4.2 3.9 -- 2.5*  CL 107 107 -- 106  CO2 21 24 -- 25  GLUCOSE 147* 123* -- 90  BUN 12 11 -- 9  CREATININE 1.01 0.88 -- 0.70  CALCIUM 8.5 8.8 -- 9.0  MG -- -- 1.6 --  PHOS -- -- -- --   Liver Function Tests:  Lab 08/25/12 0505 08/24/12 1215 08/24/12 0533  AST 25 -- 21  ALT 12 -- 9  ALKPHOS 77 -- 69  BILITOT 1.7* -- 2.3*  PROT 7.1 7.1 7.3  ALBUMIN 2.8* -- 3.0*    Lab 08/24/12 0533  LIPASE 5*  AMYLASE --   No results found for this basename: AMMONIA:5 in the last 168 hours CBC:  Lab 08/25/12 0505 08/24/12 0533  WBC 9.0 7.8  NEUTROABS -- 5.5  HGB 10.3* 10.5*  HCT 34.8* 33.2*  MCV 87.4 82.4  PLT 215 224   Cardiac Enzymes:  Lab 08/24/12 2130 08/24/12 1849 08/24/12 1215  CKTOTAL -- -- --  CKMB -- -- --  CKMBINDEX -- -- --  TROPONINI <0.30 <0.30 <0.30   BNP (last 3 results)  Basename 08/25/12 0505 07/18/12 0240 04/23/12 0232  PROBNP 17029.0* 6251.0* 3629.0*   CBG:  Lab 08/27/12 0738 08/26/12 2111 08/26/12 1636 08/26/12 1136 08/26/12 0732  GLUCAP 96 149* 129* 127* 92    Recent Results (from the past 240 hour(s))  URINE CULTURE     Status: Normal (Preliminary result)   Collection Time   08/24/12  5:50 AM      Component Value Range Status Comment    Specimen Description URINE, CATHETERIZED   Final    Special Requests NONE   Final    Culture  Setup Time 08/25/2012 18:17   Final    Colony Count >=100,000 COLONIES/ML   Final    Culture ESCHERICHIA COLI   Final    Report Status PENDING   Incomplete      Studies: Ct Head Wo Contrast  08/27/2012  *RADIOLOGY REPORT*  Clinical Data: Lethargic with slurred speech; headache  CT HEAD WITHOUT CONTRAST  Technique:  Contiguous axial images were obtained from the base of the skull through the vertex without contrast.  Comparison: April 23, 2012  Findings:  There is ex vacuo enlargement of the left lateral ventricle.  Other ventricles are normal in size and configuration. There is no mass, hemorrhage, extra-axial fluid collection, or midline shift.  There is evidence of a prior infarct involving much of the left temporal lobe, posterior frontal lobe, and anterior parietal lobe, stable.  There is no evidence of extension of this infarct compared to prior study.  There is evidence of a prior small infarct in the mid medial left cerebellum, stable.  There is no acute appearing infarct on this study.  There are no new gray-white compartment lesions.  There is mild small vessel disease in the right centrum semiovale.  Bony calvarium appears intact.  The mastoid air cells are clear.  IMPRESSION: Prior infarcts.  No acute appearing infarct.  No hemorrhage or mass effect.  Stable ex vacuo phenomenon resulting in enlargement of the left lateral ventricle.   Original Report Authenticated By: Bretta Bang, M.D.     Scheduled Meds:    . amiodarone  200 mg Oral Daily  . cefTRIAXone (ROCEPHIN)  IV  1 g Intravenous Q24H  . digoxin  0.125 mg Oral Daily  . escitalopram  10 mg Oral Daily  . insulin aspart  0-15 Units Subcutaneous TID WC  . levetiracetam  1,000 mg Intravenous BID  . levothyroxine  50 mcg Oral QAC breakfast  . metoprolol tartrate  25 mg Oral BID  . OxyCODONE  80 mg Oral Q12H  . pantoprazole  40 mg  Oral Daily  . QUEtiapine  50 mg Oral Daily  . rivaroxaban  20 mg Oral Q supper  . sodium chloride  3 mL Intravenous Q12H  . spironolactone  25 mg Oral  Daily  . [DISCONTINUED] levETIRAcetam  1,000 mg Oral BID   Continuous Infusions:    Principal Problem:  *Vomiting and diarrhea Active Problems:  Aphasia as late effect of cerebrovascular accident  Chronic systolic heart failure  Atrial fibrillation  Hypothyroidism  Thrombocytopenia  Cardiomyopathy  DVT of leg (deep venous thrombosis)  Seizure disorder  Hypokalemia  UTI (lower urinary tract infection)  Protein C deficiency   Elizabeth Love  [facility] Pager [phone]. If 8PM-8AM, please contact night-coverage at [email or web address], password [facility] 08/27/2012, 4:07 PM  LOS: 3 days

## 2012-08-27 NOTE — Progress Notes (Signed)
Routine EEG completed.  

## 2012-08-27 NOTE — Progress Notes (Signed)
Monitor tech informs the nurse that the patient experienced 8 beats of V-tach. Nurse checks in on the patient and she was asymptomatic. Elizabeth Love

## 2012-08-27 NOTE — Consult Note (Signed)
CARDIOLOGY CONSULT NOTE  Patient ID: Elizabeth Love MRN: [id number] DOB/AGE: [birth year] 56 y.o.  Admit date: 08/24/2012 Referring Physician: Dr Elizabeth Guise Primary Physician: Primary Cardiologist: none Reason for Consultation: Ventricular Tachycardia  HPI:  56 year-old unfortunate woman with cardiomyopathy who presented with altered mental status. She has had previous stroke and also has an underlying seizure disorder. She essentially is unable to provide any history for me. Her ICD has discharged several times and we were asked to see her for further management and evaluation. Her LVEF is 10% by echo. ICD was placed in 2008, but her device follow-up has not been through our EP clinic. Device interrogation shows 4 episodes of VT/VF and she has received 5 shocks since November 16th. She has also had mode switching 69% of the time suggestive of atrial fibrillation.  The patient denies chest pain or dyspnea. She is almost nonverbal and does not answer when I ask where she lives, DOB, etc. She is able to tell me that her abdomen hurts when I palpate the abdomen. Otherwise no history is available.   Past Medical History  Diagnosis Date  . Seizures   . Hypertension   . Stroke     2007 - R sided weakness  . Asthma   . Diabetes mellitus   . 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 Thyroid disease   . LV dysfunction     Per brother, EF 17% s/p AICD 2007 ([employer]). No hx CAD.     Past Surgical History  Procedure Date  . Cholecystectomy   . Appendectomy   . Pacemaker insertion   . Vena cava filter placement 07/19/2012    Procedure: INSERTION VENA-CAVA FILTER;  Surgeon: Larina Earthly, MD;  Location: [facility] OR;  Service: Vascular;  Laterality: [name];     Family History  Problem Relation Age of Onset  . Coronary artery disease Neg Hx     Social History: History   Social History  . Marital Status: Single    Spouse Name: [name]    Number of Children: [name]  . Years of Education: [name]    Occupational History  . Not on file.   Social History Main Topics  . Smoking status: Former Games developer  . Smokeless tobacco: Not on file  . Alcohol Use: No  . Drug Use: No  . Sexually Active:    Other Topics Concern  . Not on file   Social History Narrative  . No narrative on file     Prescriptions prior to admission  Medication Sig Dispense Refill  . albuterol (PROVENTIL) (2.5 MG/3ML) 0.083% nebulizer solution Take 2.5 mg by nebulization every 6 (six) hours as needed. For cough or wheeze      . ALPRAZolam (XANAX) 0.5 MG tablet Take 0.5 mg by mouth at bedtime as needed. For sleep      . amiodarone (PACERONE) 200 MG tablet Take 200 mg by mouth daily.      . digoxin (LANOXIN) 0.125 MG tablet Take 0.25 mg by mouth daily. Takes 2 tabs daily      . escitalopram (LEXAPRO) 10 MG tablet Take 10 mg by mouth daily.      . furosemide (LASIX) 20 MG tablet Take 20 mg by mouth daily.      Marland Kitchen glipiZIDE (GLUCOTROL XL) 5 MG 24 hr tablet Take 1 tablet (5 mg total) by mouth daily.  30 tablet  1  . levETIRAcetam (KEPPRA) 500 MG tablet Take 1,000 mg by  mouth 2 (two) times daily.      Marland Kitchen levothyroxine (SYNTHROID, LEVOTHROID) 50 MCG tablet Take 50 mcg by mouth daily.      Marland Kitchen lisinopril (PRINIVIL,ZESTRIL) 5 MG tablet Take 5 mg by mouth daily.      Marland Kitchen oxyCODONE-acetaminophen (PERCOCET) 10-325 MG per tablet Take 1 tablet by mouth 3 (three) times daily. Scheduled per pharmacy      . Rivaroxaban (XARELTO) 15 MG TABS tablet Take 1 tablet (15 mg total) by mouth 2 (two) times daily with a meal.  42 tablet  0    ROS: Unable to obtain secondary to poor historian.  Physical Exam: Blood pressure 128/75, pulse 97, temperature 98.4 F (36.9 C), temperature source Oral, resp. rate 16, weight 57.6 kg (126 lb 15.8 oz), SpO2 97.00%.  Pt is alert and awake, chronically ill-appearing, NAD, not oriented to place or time HEENT: normal Neck: JVP normal. Carotid upstrokes normal without bruits. No thyromegaly. Lungs: equal  expansion, clear bilaterally CV: Apex is discrete and nondisplaced, RRR without murmur or gallop Abd: soft, mild diffuse tenderness Back: no CVA tenderness Ext: no C/C/E        DP/PT pulses intact and = Skin: warm and dry without rash Neuro: Right hemiplegia  Labs:   Lab Results  Component Value Date   WBC 9.0 08/25/2012   HGB 10.3* 08/25/2012   HCT 34.8* 08/25/2012   MCV 87.4 08/25/2012   PLT 215 08/25/2012    Lab 08/25/12 0505  NA 142  K 4.2  CL 107  CO2 21  BUN 12  CREATININE 1.01  CALCIUM 8.5  PROT 7.1  BILITOT 1.7*  ALKPHOS 77  ALT 12  AST 25  GLUCOSE 147*   Lab Results  Component Value Date   CKTOTAL 100 04/23/2012   CKMB 2.6 04/23/2012   TROPONINI <0.30 08/24/2012    No results found for this basename: CHOL   No results found for this basename: HDL   No results found for this basename: LDLCALC   No results found for this basename: TRIG   No results found for this basename: CHOLHDL   No results found for this basename: LDLDIRECT      Radiology: Ct Head Wo Contrast  08/27/2012  *RADIOLOGY REPORT*  Clinical Data: Lethargic with slurred speech; headache  CT HEAD WITHOUT CONTRAST  Technique:  Contiguous axial images were obtained from the base of the skull through the vertex without contrast.  Comparison: April 23, 2012  Findings:  There is ex vacuo enlargement of the left lateral ventricle.  Other ventricles are normal in size and configuration. There is no mass, hemorrhage, extra-axial fluid collection, or midline shift.  There is evidence of a prior infarct involving much of the left temporal lobe, posterior frontal lobe, and anterior parietal lobe, stable.  There is no evidence of extension of this infarct compared to prior study.  There is evidence of a prior small infarct in the mid medial left cerebellum, stable.  There is no acute appearing infarct on this study.  There are no new gray-white compartment lesions.  There is mild small vessel disease in the right  centrum semiovale.  Bony calvarium appears intact.  The mastoid air cells are clear.  IMPRESSION: Prior infarcts.  No acute appearing infarct.  No hemorrhage or mass effect.  Stable ex vacuo phenomenon resulting in enlargement of the left lateral ventricle.   Original Report Authenticated By: Bretta Bang, M.D.     EKG: NSR with LVH, QRS widening, PVC, nonspecific  ST-T changes  2D Echo: 04/23/12 -  Study Conclusions  - Left ventricle: The cavity size was severely dilated. Wall thickness was increased in a pattern of mild LVH. The estimated ejection fraction was 10%. Diffuse hypokinesis. - Mitral valve: Severe regurgitation. - Left atrium: The atrium was severely dilated. - Right ventricle: The cavity size was mildly dilated. - Right atrium: The atrium was mildly dilated. - Tricuspid valve: Moderate regurgitation. - Pulmonary arteries: PA peak pressure: 39mm Hg (S). - Pericardium, extracardiac: A small, free-flowing pericardial effusion was identified. The fluid had no internal echoes.There was no evidence of hemodynamic compromise.  ASSESSMENT AND PLAN:  1. VT/VF 2. Severe cardiomyopathy, presumably nonischemic, LVEF 10% 3. 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 Hypercoagulable disorder on Xarelto 5. Paroxysmal atrial fibrillation  Patient is a poor candidate for anything other than conservative management. She is on amiodarone, but having breakthrough VT/VF with known severe LV dysfunction. Recommend change metoprolol to carvedilol. I'm going to ask EP to evaluate her for consideration of a second antiarrhythmic. In the meantime, will increase amiodarone dose to 400 mg daily. Will follow with you, thx.  Tonny Bollman 08/27/2012, 5:43 PM

## 2012-08-27 NOTE — Progress Notes (Signed)
Pt very drowsy, not herself. Not really interacting. Vital signs stable. Dr. [name] on floor and notified. Orders received.

## 2012-08-28 DIAGNOSIS — N390 Urinary tract infection, site not specified: Principal | ICD-10-CM

## 2012-08-28 DIAGNOSIS — D6859 Other primary thrombophilia: Secondary | ICD-10-CM

## 2012-08-28 DIAGNOSIS — I5022 Chronic systolic (congestive) heart failure: Secondary | ICD-10-CM

## 2012-08-28 DIAGNOSIS — D689 Coagulation defect, unspecified: Secondary | ICD-10-CM

## 2012-08-28 DIAGNOSIS — G40909 Epilepsy, unspecified, not intractable, without status epilepticus: Secondary | ICD-10-CM

## 2012-08-28 LAB — GLUCOSE, CAPILLARY: Glucose-Capillary: 130 mg/dL — ABNORMAL HIGH (ref 70–99)

## 2012-08-28 LAB — FOLATE: Folate: 7.2 ng/mL

## 2012-08-28 LAB — RPR: RPR Ser Ql: NONREACTIVE

## 2012-08-28 MED ORDER — CEFUROXIME AXETIL 125 MG/5ML PO SUSR
125.0000 mg | Freq: Two times a day (BID) | ORAL | Status: DC
  Administered 2012-08-28 – 2012-08-29 (×3): 125 mg via ORAL
  Filled 2012-08-28 (×4): qty 5

## 2012-08-28 NOTE — Procedures (Signed)
EEG NUMBER:  REFERRING PHYSICIAN:  Lonia Blood, M.D.  HISTORY:  A 56 year old female admitted with nausea and vomiting, now with altered mental status.  History of seizure disorder.  MEDICATIONS:  Keppra, amiodarone, Rocephin, Lexapro, NovoLog, Synthroid, Lopressor, OxyContin, Protonix, Xarelto, Seroquel, Aldactone.  CONDITIONS OF RECORDING:  This is a 16-channel EEG carried out with the patient in the lethargic state.  DESCRIPTION:  The posterior background activity is slow and for the majority of the tracing is between 4-5 hertz theta, although it does reach a maximum of 6 hertz.  The central and temporal regions are dominated by theta activity as well, although some delta activity is intermixed.  This activity is poorly organized.  There is a lack of faster frequencies throughout the tracing and particularly anteriorly. No epileptiform activity is noted.  Hyperventilation was not performed. Intermittent photic stimulation failed to elicit any change in the tracing.  IMPRESSION:  This EEG is characterized by general background slowing. Although this can be seen with drowse, can now rule out the possibility of general background slowing related to a diffuse disturbance that is etiologically nonspecific, but may include a metabolic encephalopathy among other possibilities.  No epileptiform activity is noted.          ______________________________ Thana Farr, MD    [id number] D:  08/27/2012 18:48:48  T:  08/28/2012 05:06:53  Job #:  [id number]

## 2012-08-28 NOTE — Progress Notes (Signed)
[facility] PROGRESS NOTE  Elizabeth Love YNW:[id number] DOB: October 08, 1955 DOA: 08/24/2012 PCP: [name], [name], MD  Assessment/Plan: 1. N/V/D/ Abd pain - ? Cause - Gastroenteritis vs maybe from UTI - AXR negative for impaction or obstruction . Patient has not vomited or had diarrhea in the hospital. Diet advanced to D3 on 08/26/12. Improving on Rocephin and urine culture grew out > 100,000 CFU of E. Coli. 2. Worsening mental status on the morning of December 10 - the nurse reported the patient was difficult to arouse as the day went on the patient became more arousable and returning back to her prior functional status - the clinical scenario is highly suggestive of a breakthrough seizure. CT head obtained on December 10 was negative for hemorrhage or acute stroke. We converted Keppra to iv 08/27/12 .  EEG obtained and did not show any focal epileptiform activity.  Reported as characterized by general background slowing etiology non specific, but may include metabolic encephalopathy.  At this point I suspect that this is most likely due to UTI.  But will check for other causes and order vitamin B12, folate, rpr, hiv. 3. Chronic systolic CHF - seems to be getting euvolemic. Able to take pos. Given low EF we have  stopped  iv fluids after the first 24 hours of treatment  4. Stroke, chronic aphasia, -  5. Seizure ds - patient was continued on  Keppra.  - EEG results reviewed will not increased keppra dose given that no epileptiform activity was noted on EEG. 6. E coli UTI - urine culture reviewed. Started empiric Rocephin on admission and based on sensitivities will switch to Ceftin today is day # 6  7. Hx of RLE DVT 07/26/12 - patient with protein C deficiency  - s/p IVC filter , on therapy with Xarelto  8. S/p AICD - the device has fired 3 times in the past month - cardiology consult called 08/27/12 EP specialist to review and make further recommendations - unclear if this is the source of  intermittent confusion  9. DM type 2 - SSI for now.  10. Hypokalemia - from N/V/D - resolved with iv fluids 11. Chronic pain syndrome resumed BID oxycontin    Code Status: full Family Communication: No family at bedside Disposition Plan: home      Antibiotics: Rocephin 12/7  HPI/Subjective: No new complaints today. Patient with limited interaction with examiner and limits responses to okay or nods head to response.   Objective: Filed Vitals:   08/27/12 1753 08/27/12 2055 08/28/12 0529 08/28/12 0902  BP: 128/73 122/74 118/70 125/83  Pulse: 94 94 70 71  Temp:  98.1 F (36.7 C) 97.8 F (36.6 C) 98.2 F (36.8 C)  TempSrc:  Oral Oral Oral  Resp: 17 18 16 16   Weight:  57.7 kg (127 lb 3.3 oz)    SpO2: 96% 91% 98% 98%    Intake/Output Summary (Last 24 hours) at 08/28/12 1113 Last data filed at 08/28/12 0904  Gross per 24 hour  Intake      0 ml  Output      0 ml  Net      0 ml   Filed Weights   08/25/12 2039 08/26/12 2027 08/27/12 2055  Weight: 58.6 kg (129 lb 3 oz) 57.6 kg (126 lb 15.8 oz) 57.7 kg (127 lb 3.3 oz)    Exam:   General: more lethargic   Cardiovascular: RRR, S3 gallop  Respiratory: CTAB, no w,r,c   Abdomen: soft, nt  Data Reviewed: Basic Metabolic Panel:  Lab 08/25/12 1610 08/24/12 1849 08/24/12 1215 08/24/12 0533  NA 142 143 -- 145  K 4.2 3.9 -- 2.5*  CL 107 107 -- 106  CO2 21 24 -- 25  GLUCOSE 147* 123* -- 90  BUN 12 11 -- 9  CREATININE 1.01 0.88 -- 0.70  CALCIUM 8.5 8.8 -- 9.0  MG -- -- 1.6 --  PHOS -- -- -- --   Liver Function Tests:  Lab 08/25/12 0505 08/24/12 1215 08/24/12 0533  AST 25 -- 21  ALT 12 -- 9  ALKPHOS 77 -- 69  BILITOT 1.7* -- 2.3*  PROT 7.1 7.1 7.3  ALBUMIN 2.8* -- 3.0*    Lab 08/24/12 0533  LIPASE 5*  AMYLASE --   No results found for this basename: AMMONIA:5 in the last 168 hours CBC:  Lab 08/25/12 0505 08/24/12 0533  WBC 9.0 7.8  NEUTROABS -- 5.5  HGB 10.3* 10.5*  HCT 34.8* 33.2*  MCV 87.4 82.4   PLT 215 224   Cardiac Enzymes:  Lab 08/24/12 2130 08/24/12 1849 08/24/12 1215  CKTOTAL -- -- --  CKMB -- -- --  CKMBINDEX -- -- --  TROPONINI <0.30 <0.30 <0.30   BNP (last 3 results)  Basename 08/25/12 0505 07/18/12 0240 04/23/12 0232  PROBNP 17029.0* 6251.0* 3629.0*   CBG:  Lab 08/28/12 0739 08/27/12 2101 08/27/12 1737 08/27/12 1443 08/27/12 0738  GLUCAP 112* 116* 114* 107* 96    Recent Results (from the past 240 hour(s))  URINE CULTURE     Status: Normal   Collection Time   08/24/12  5:50 AM      Component Value Range Status Comment   Specimen Description URINE, CATHETERIZED   Final    Special Requests NONE   Final    Culture  Setup Time 08/25/2012 18:17   Final    Colony Count >=100,000 COLONIES/ML   Final    Culture ESCHERICHIA COLI   Final    Report Status 08/27/2012 FINAL   Final    Organism ID, Bacteria ESCHERICHIA COLI   Final      Studies: Ct Head Wo Contrast  08/27/2012  *RADIOLOGY REPORT*  Clinical Data: Lethargic with slurred speech; headache  CT HEAD WITHOUT CONTRAST  Technique:  Contiguous axial images were obtained from the base of the skull through the vertex without contrast.  Comparison: April 23, 2012  Findings:  There is ex vacuo enlargement of the left lateral ventricle.  Other ventricles are normal in size and configuration. There is no mass, hemorrhage, extra-axial fluid collection, or midline shift.  There is evidence of a prior infarct involving much of the left temporal lobe, posterior frontal lobe, and anterior parietal lobe, stable.  There is no evidence of extension of this infarct compared to prior study.  There is evidence of a prior small infarct in the mid medial left cerebellum, stable.  There is no acute appearing infarct on this study.  There are no new gray-white compartment lesions.  There is mild small vessel disease in the right centrum semiovale.  Bony calvarium appears intact.  The mastoid air cells are clear.  IMPRESSION: Prior  infarcts.  No acute appearing infarct.  No hemorrhage or mass effect.  Stable ex vacuo phenomenon resulting in enlargement of the left lateral ventricle.   Original Report Authenticated By: Bretta Bang, M.D.     Scheduled Meds:    . amiodarone  400 mg Oral Daily  . cefTRIAXone (ROCEPHIN)  IV  1  g Intravenous Q24H  . digoxin  0.125 mg Oral Daily  . escitalopram  10 mg Oral Daily  . insulin aspart  0-15 Units Subcutaneous TID WC  . levetiracetam  1,000 mg Intravenous BID  . levothyroxine  50 mcg Oral QAC breakfast  . metoprolol tartrate  50 mg Oral BID  . OxyCODONE  80 mg Oral Q12H  . pantoprazole  40 mg Oral Daily  . QUEtiapine  50 mg Oral Daily  . rivaroxaban  20 mg Oral Q supper  . sodium chloride  3 mL Intravenous Q12H  . spironolactone  25 mg Oral Daily  . [DISCONTINUED] amiodarone  200 mg Oral Daily  . [DISCONTINUED] metoprolol tartrate  25 mg Oral BID   Continuous Infusions:    Principal Problem:  *Vomiting and diarrhea Active Problems:  Aphasia as late effect of cerebrovascular accident  Chronic systolic heart failure  Atrial fibrillation  Hypothyroidism  Thrombocytopenia  Cardiomyopathy  DVT of leg (deep venous thrombosis)  Seizure disorder  Hypokalemia  UTI (lower urinary tract infection)  Protein C deficiency  Ventricular tachycardia   Penny Pia  [facility] Pager [phone]. If 8PM-8AM, please contact night-coverage at [email or web address], password [facility] 08/28/2012, 11:13 AM  LOS: 4 days

## 2012-08-28 NOTE — Consult Note (Signed)
ELECTROPHYSIOLOGY CONSULT NOTE  Patient ID: Elizabeth Love MRN: [id number], DOB/AGE: [birth year]   Admit date: 08/24/2012 Date of Consult: 08/28/2012  Primary Physician: [name], [name], MD Primary Cardiologist: previous card/EP - Dr. Normajean Glasgow in [place], [place] (per device interrogation) Reason for Consultation: ICD shocks  History of Present Illness Elizabeth Love is a 56 year old woman with multiple medical problems, specifically severe LV dysfunction with dual chamber ICD in place and paroxysmal AFib who was admitted several days ago with altered mental status, abdominal pain, nausea and vomiting. She is dysphasic and only responds with "okay" during my interview and limited exam. She is somnolent but arousable by calling her name. She is unable to provide history. According to the information available, she is not followed by any cardiologist/EP locally. She cannot tell me who she is or where she is. She does point to her abdomen when asked if she is hurting. She answers "okay" when asked if she has chest pain, SOB, palpitations or ICD shocks. Unfortunately there are no family members present to assist with history. On device interrogation Ms. Langseth has received 5 shocks since 08/03/2012. All of these were appropriately delivered for VT/VF. She is taking amiodarone 200 mg once daily. This was increased to 400 mg daily yesterday. Dr. Graciela Husbands has been asked to provide further EP recommendations.   Past Medical History Past Medical History  Diagnosis Date  . Seizures   . Hypertension   . Stroke     2007 - R sided weakness  . Asthma   . Diabetes mellitus   . 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 Thyroid disease   . LV dysfunction     Per brother, EF 17% s/p AICD 2007 ([employer]). No hx CAD.    Past Surgical History Past Surgical History  Procedure Date  . Cholecystectomy   . Appendectomy   . Pacemaker insertion   . Vena cava filter placement 07/19/2012    Procedure:  INSERTION VENA-CAVA FILTER;  Surgeon: Larina Earthly, MD;  Location: [facility] OR;  Service: Vascular;  Laterality: [name];     Allergies/Intolerances Allergies  Allergen Reactions  . Penicillins Rash  . Sulfa Antibiotics Rash    Inpatient Medications    . amiodarone  400 mg Oral Daily  . cefTRIAXone (ROCEPHIN)  IV  1 g Intravenous Q24H  . digoxin  0.125 mg Oral Daily  . escitalopram  10 mg Oral Daily  . insulin aspart  0-15 Units Subcutaneous TID WC  . levetiracetam  1,000 mg Intravenous BID  . levothyroxine  50 mcg Oral QAC breakfast  . metoprolol tartrate  50 mg Oral BID  . OxyCODONE  80 mg Oral Q12H  . pantoprazole  40 mg Oral Daily  . QUEtiapine  50 mg Oral Daily  . rivaroxaban  20 mg Oral Q supper  . sodium chloride  3 mL Intravenous Q12H  . spironolactone  25 mg Oral Daily  . [DISCONTINUED] amiodarone  200 mg Oral Daily  . [DISCONTINUED] levETIRAcetam  1,000 mg Oral BID  . [DISCONTINUED] metoprolol tartrate  25 mg Oral BID    Family History Family History  Problem Relation Age of Onset  . Coronary artery disease Neg Hx      Social History Social History  . Marital Status: Single   Social History Main Topics  . Smoking status: Former Games developer  . Smokeless tobacco: Not on file  . Alcohol Use: No  . Drug Use: No  .  Sexually Active:    Review of Systems General: No chills, fever, night sweats or weight changes  Cardiovascular: No chest pain, dyspnea on exertion, edema, orthopnea, palpitations, paroxysmal nocturnal dyspnea Dermatological: No rash, lesions or masses Respiratory: No cough, dyspnea Urologic: No hematuria, dysuria Abdominal: No nausea, vomiting, diarrhea, bright red blood per rectum, melena, or hematemesis Neurologic: No visual changes, weakness, changes in mental status All other systems reviewed and are otherwise negative except as noted above.  Physical Exam Blood pressure 125/83, pulse 71, temperature 98.2 F (36.8 C), temperature source Oral, resp.  rate 16, weight 127 lb 3.3 oz (57.7 kg), SpO2 98.00%.  General: Well developed, chronically ill appearing, somnolent 56 year old female in no acute distress. HEENT: Normocephalic, atraumatic. Sclera nonicteric. Oropharynx clear.  Neck: Supple. No JVD. Lungs: Shallow inspiration. Respirations regular and unlabored, CTA bilaterally. No weezes, rales or rhonchi. Heart: RRR. S1, S2 present. No murmurs, rub, S3 or S4.  Abdomen: Soft, non-distended.  Extremities: No clubbing, cyanosis or edema.  Neuro: Somnolent. Only responds "okay" to all questions. Moves upper extremities spontaneously. Unable to assess LE strength/movement as patient will not follow commands. Musculoskeletal: No obvious deformity with patient lying supine. Skin: Intact. Warm and dry. No rashes or petechiae in exposed areas.   Labs Lab Results  Component Value Date   WBC 9.0 08/25/2012   HGB 10.3* 08/25/2012   HCT 34.8* 08/25/2012   MCV 87.4 08/25/2012   PLT 215 08/25/2012    Lab 08/25/12 0505  NA 142  K 4.2  CL 107  CO2 21  BUN 12  CREATININE 1.01  CALCIUM 8.5  PROT 7.1  BILITOT 1.7*  ALKPHOS 77  ALT 12  AST 25  GLUCOSE 147*   No components found with this basename: MAGNESIUM No components found with this basename: POCBNP:3  Lab Results  Component Value Date   DDIMER 2.58* 07/18/2012   No results found for this basename: TSH,T4TOTAL,FREET3,T3FREE,THYROIDAB in the last 72 hours   Radiology/Studies Dg Chest 2 View  08/24/2012  *RADIOLOGY REPORT*  Clinical Data: Wheezing.  Ex-smoker.  CHEST - 2 VIEW  Comparison: 07/20/2012.  Findings: Stable left subclavian pacer and AICD leads.  Stable enlarged cardiac silhouette and prominent pulmonary vasculature and interstitial markings.  Small bilateral pleural effusions. Unremarkable bones.  IMPRESSION: Stable cardiomegaly mild changes of congestive heart failure.   Original Report Authenticated By: Beckie Salts, M.D.    Dg Abd 1 View  08/24/2012  *RADIOLOGY REPORT*   Clinical Data: Abdominal pain, nausea, vomiting.  ABDOMEN - 1 VIEW  Comparison: 07/19/2012  Findings: Nonobstructive bowel gas pattern.  No supine evidence of free air.  IVC filter remains in place, unchanged.  Prior cholecystectomy.  No organomegaly or suspicious calcification.  No acute bony abnormality.  IMPRESSION: No acute findings.   Original Report Authenticated By: Charlett Nose, M.D.    Ct Head Wo Contrast  08/27/2012  *RADIOLOGY REPORT*  Clinical Data: Lethargic with slurred speech; headache  CT HEAD WITHOUT CONTRAST  Technique:  Contiguous axial images were obtained from the base of the skull through the vertex without contrast.  Comparison: April 23, 2012  Findings:  There is ex vacuo enlargement of the left lateral ventricle.  Other ventricles are normal in size and configuration. There is no mass, hemorrhage, extra-axial fluid collection, or midline shift.  There is evidence of a prior infarct involving much of the left temporal lobe, posterior frontal lobe, and anterior parietal lobe, stable.  There is no evidence of extension of this  infarct compared to prior study.  There is evidence of a prior small infarct in the mid medial left cerebellum, stable.  There is no acute appearing infarct on this study.  There are no new gray-white compartment lesions.  There is mild small vessel disease in the right centrum semiovale.  Bony calvarium appears intact.  The mastoid air cells are clear.  IMPRESSION: Prior infarcts.  No acute appearing infarct.  No hemorrhage or mass effect.  Stable ex vacuo phenomenon resulting in enlargement of the left lateral ventricle.   Original Report Authenticated By: Bretta Bang, M.D.    US Abdomen Limited  08/24/2012  *RADIOLOGY REPORT*  Clinical Data: Abdominal pain, nausea and vomiting, request for paracentesis  US ABDOMEN LIMITED  Comparison:  None  An ultrasound guided paracentesis was thoroughly discussed with the patient and questions answered.  The benefits,  risks, alternatives and complications were also discussed.  The patient understands and wishes to proceed with the procedure.  Written consent was obtained.  Findings: Ultrasound imaging of the abdomen finds only very small amount of perihepatic fluid, not amenable for paracentesis.  No other ascites noted. No procedure performed.  IMPRESSION: Trace perihepatic ascites not suitable for paracentesis.  Read by Brayton El PA-C   Original Report Authenticated By: Richarda Overlie, M.D.     Echocardiogram August 2013 Study Conclusions - Left ventricle: The cavity size was severely dilated. Wall thickness was increased in a pattern of mild LVH. The estimated ejection fraction was 10%. Diffuse hypokinesis. - Mitral valve: Severe regurgitation. - Left atrium: The atrium was severely dilated. - Right ventricle: The cavity size was mildly dilated. - Right atrium: The atrium was mildly dilated. - Tricuspid valve: Moderate regurgitation. - Pulmonary arteries: PA peak pressure: 39mm Hg (S). - Pericardium, extracardiac: A small, free-flowing pericardial effusion was identified. The fluid had no internal echoes.There was no evidence of hemodynamic compromise.  12-lead ECG shows sinus rhythm with QRS widening and lateral T wave inversions Telemetry strips reviewed show sinus rhythm with few runs NSVT, monomorphic    Assessment and Plan 1. VT/VF s/p multiple appropriate ICD shocks since 08/03/2012 2. Cardiomyopathy with severe LV dysfunction 3. Paroxysmal AFib 4. Dysphasia, intermittent AMS 5. Seizure disorder 6. Prior CVA  Dr. Graciela Husbands to see Signed, Rick Duff, PA-C 08/28/2012, 10:36 AM  She was in atrial fibrillation for much of recent months, ant this terminated with her shock in mid NOV;  Since she has been in sinus  Temporally these episodes seem to be related to hypokalemia;  The day of admissiion with a shock it was 2.5,  When seen in 11/10 6 days prior to 3 shocks it was 3,1 with no  intervening data.  VT-PM is certainly more likely with hypokalemia, although only the last episode was long-short initiated  Dig toxicity is enhanced in setting  Of hypokalemia   But what a terrible situation.  We need help to know how best ot care for this woman.   What is the big picture??  She does recognize her name out of 3 possibilities-- first and last, but i can get no more information from her  Recs, 1) reduce amio back to 200 2) keep K>4.0 3) add aldactone to facilitate 2 as well as primary benefits for cardiomyopathy 4) social service consult??

## 2012-08-29 LAB — GLUCOSE, CAPILLARY: Glucose-Capillary: 82 mg/dL (ref 70–99)

## 2012-08-29 MED ORDER — AMIODARONE HCL 200 MG PO TABS
200.0000 mg | ORAL_TABLET | Freq: Every day | ORAL | Status: DC
  Administered 2012-08-29: 200 mg via ORAL
  Filled 2012-08-29: qty 1

## 2012-08-29 MED ORDER — DIGOXIN 125 MCG PO TABS: 0.1250 mg | ORAL_TABLET | Freq: Every day | ORAL | Status: DC

## 2012-08-29 MED ORDER — METOPROLOL TARTRATE 50 MG PO TABS
75.0000 mg | ORAL_TABLET | Freq: Two times a day (BID) | ORAL | Status: DC
  Administered 2012-08-29: 75 mg via ORAL
  Filled 2012-08-29 (×2): qty 1

## 2012-08-29 MED ORDER — CEFUROXIME AXETIL 125 MG/5ML PO SUSR: 125.0000 mg | Freq: Two times a day (BID) | ORAL | Status: DC

## 2012-08-29 MED ORDER — METOPROLOL TARTRATE 25 MG PO TABS: 75.0000 mg | ORAL_TABLET | Freq: Two times a day (BID) | ORAL | Status: DC

## 2012-08-29 MED ORDER — RIVAROXABAN 20 MG PO TABS: 20.0000 mg | ORAL_TABLET | Freq: Every day | ORAL | Status: DC

## 2012-08-29 MED ORDER — SPIRONOLACTONE 25 MG PO TABS: 25.0000 mg | ORAL_TABLET | Freq: Every day | ORAL | Status: DC

## 2012-08-29 NOTE — Progress Notes (Signed)
SUBJECTIVE: The patient is doing well today.  She states "Im OK" but is unable to provide additional helpful history.     Marland Kitchen amiodarone  200 mg Oral Daily  . cefUROXime  125 mg Oral Q12H  . digoxin  0.125 mg Oral Daily  . escitalopram  10 mg Oral Daily  . insulin aspart  0-15 Units Subcutaneous TID WC  . levetiracetam  1,000 mg Intravenous BID  . levothyroxine  50 mcg Oral QAC breakfast  . metoprolol tartrate  75 mg Oral BID  . OxyCODONE  80 mg Oral Q12H  . pantoprazole  40 mg Oral Daily  . QUEtiapine  50 mg Oral Daily  . rivaroxaban  20 mg Oral Q supper  . sodium chloride  3 mL Intravenous Q12H  . spironolactone  25 mg Oral Daily  . [DISCONTINUED] amiodarone  400 mg Oral Daily  . [DISCONTINUED] cefTRIAXone (ROCEPHIN)  IV  1 g Intravenous Q24H  . [DISCONTINUED] metoprolol tartrate  50 mg Oral BID      OBJECTIVE: Physical Exam: Filed Vitals:   08/28/12 1403 08/28/12 1800 08/28/12 2220 08/29/12 0600  BP: 114/79 114/80 120/66 124/64  Pulse: 67 73 72 71  Temp: 97.8 F (36.6 C) 97.4 F (36.3 C) 99 F (37.2 C) 97.9 F (36.6 C)  TempSrc: Oral Oral Oral Oral  Resp: 16 20 18 16   Weight:   133 lb 9.6 oz (60.6 kg)   SpO2: 98% 97% 94% 93%    Intake/Output Summary (Last 24 hours) at 08/29/12 1610 Last data filed at 08/28/12 1838  Gross per 24 hour  Intake    180 ml  Output      0 ml  Net    180 ml    Telemetry reveals sinus rhythm with very short NSVT,  No sustained VT  General: Well developed, chronically ill appearing, somnolent 56 year old female in no acute distress.  HEENT: Normocephalic, atraumatic. Sclera nonicteric. Oropharynx clear.  Neck: Supple. No JVD.  Lungs: Shallow inspiration. Respirations regular and unlabored, CTA bilaterally. No weezes, rales or rhonchi.  Heart: RRR. S1  Abdomen: Soft, non-distended.  Extremities: No clubbing, cyanosis or edema.  Neuro: alert. Moves upper extremities spontaneously. Unable to assess LE strength/movement as patient  will not follow commands.  Musculoskeletal: No obvious deformity with patient lying supine.  Skin: Intact. Warm and dry. No rashes or petechiae in exposed areas.  Labs pending today  ASSESSMENT AND PLAN:  Principal Problem:  *Vomiting and diarrhea Active Problems:  Aphasia as late effect of cerebrovascular accident  Chronic systolic heart failure  Atrial fibrillation  Hypothyroidism  Thrombocytopenia  Cardiomyopathy  DVT of leg (deep venous thrombosis)  Seizure disorder  Hypokalemia  UTI (lower urinary tract infection)  Protein C deficiency  Ventricular tachycardia  1. VT No further sustained VT Agree with Dr Graciela Husbands to decrease amiodarone back to 200mg  daily Will also increase metoprolol to 75mg  BID.  This could be further uptitrated to 100mg  BID if BP allows going forward. Keep K>3.9 and Mg >1.9  2. Afib Maintaining sinus rhythm Continue xarelto  3. Chronic systolic dysfunction Not presently volume overloaded Keep Is and Os about even Agree with Dr Graciela Husbands that spironolactone might be helpful if BP tolerates  No further inpatient EP evaluation planned. She should follow-up with Tereso Newcomer and one of our CHF specialists in our office in 4-6 weeks.  We will see as needed while here. Please call with questions  Hillis Range, MD 08/29/2012 8:11 AM

## 2012-08-29 NOTE — Progress Notes (Signed)
PT Cancellation Note  Patient Details Name: SHUNTAVIA YERBY MRN: [id number] DOB: 08/24/1956   Cancelled Treatment:    Reason Eval/Treat Not Completed: pt has written d/c orders. Spoke with RN who reports pt has no PT needs at this time.   Natascha Edmonds 08/29/2012, 4:03 PM Pager [phone]

## 2012-08-29 NOTE — Discharge Summary (Signed)
Physician Discharge Summary  Elizabeth Love ZOX:[id number] DOB: 04-20-1956 DOA: 08/24/2012  PCP: Estill Cotta, MD  Admit date: 08/24/2012 Discharge date: 08/29/2012  Time spent: 35 minutes  Recommendations for Outpatient Follow-up:  1. Please make sure patient follow's up with cardiologist in 4-6 weeks 2. Follow up with potassium and magnesium levels 3. Make sure patient finishes taking all of her antibiotics as recommended  Discharge Diagnoses:  Principal Problem:  *Vomiting and diarrhea Active Problems:  Aphasia as late effect of cerebrovascular accident  Chronic systolic heart failure  Atrial fibrillation  Hypothyroidism  Thrombocytopenia  Cardiomyopathy  DVT of leg (deep venous thrombosis)  Seizure disorder  Hypokalemia  UTI (lower urinary tract infection)  Protein C deficiency  Ventricular tachycardia   Discharge Condition: stable  Diet recommendation: Dysphagia 3 diet  Filed Weights   08/26/12 2027 08/27/12 2055 08/28/12 2220  Weight: 57.6 kg (126 lb 15.8 oz) 57.7 kg (127 lb 3.3 oz) 60.6 kg (133 lb 9.6 oz)    History of present illness:  From original HPI: 56 year old female with a complicated past medical history including a history of prior stroke, associated seizure disorder, last tonic-clonic seizure more than a year ago and absence seizures every 2 weeks, who presents with intractable nausea vomiting related to urinary tract infection. She has also had diarrhea and her symptoms started approximately 24 hours ago. On exam patient is rocking back and forth in pain and states it hurts everywhere and moaning and groaning in pain. Unable to localized point tenderness in her abdomen. Difficulty communicating due to prior stroke and speech is slow and slurred. She denies sick contacts. No recent change in her medications. Patient was seen at the beginning of November for similar symptoms with lower abdominal pain and had a CT at that time showed moderate  ascites marked cardiomegaly ,. Patient has known CHF with an EF of 17% and an AICD. She has no signs of fluid overload today but will hydrate gently. Concern for viral process versus diverticulitis. Status post cholecystectomy and appendectomy.  Hospital Course:  While in house cardiology was consulted due to non sustained ventricular Tachycardia and per their last recommendations: 1. VT  No further sustained VT  Agree with Dr Graciela Husbands to decrease amiodarone back to 200mg  daily  Will also increase metoprolol to 75mg  BID. This could be further uptitrated to 100mg  BID if BP allows going forward.  Keep K>3.9 and Mg >1.9  2. Afib  Maintaining sinus rhythm  Continue xarelto  3. Chronic systolic dysfunction  Not presently volume overloaded  Keep Is and Os about even  Agree with Dr Graciela Husbands that spironolactone might be helpful if BP tolerates  No further inpatient EP evaluation planned.  She should follow-up with Tereso Newcomer and one of our CHF specialists in our office in 4-6 weeks.  1. N/V/D/ Abd pain - Cause most likely from UTI given improvement and resolution with antibiotic regimen. - AXR negative for impaction or obstruction . Patient has not vomited or had diarrhea in the hospital. Diet advanced to D3 on 08/26/12. Improved on Rocephin and urine culture grew out > 100,000 CFU of E. Coli sensitive to rocephin and after discussion with pharmacist ceftin felt to be a good oral agent when looking at sensitivities. 2. Worsening mental status on the morning of December 10 - the nurse reported the patient was difficult to arouse as the day went on the patient became more arousable and returning back to her prior functional status - the  clinical scenario is highly suggestive of a breakthrough seizure. CT head obtained on December 10 was negative for hemorrhage or acute stroke. We converted Keppra to iv 08/27/12 . EEG obtained and did not show any focal epileptiform activity. Reported as characterized by general  background slowing etiology non specific, but may include metabolic encephalopathy. At this point I suspect that this is most likely due to UTI. Folate, vitamin b 12, rpr, hiv all within normal limits or non reactive. 3. Chronic systolic CHF - seems to be getting euvolemic. Able to take pos. Given low EF we have stopped iv fluids after the first 24 hours of treatment  4. Stroke, chronic aphasia, -  5. Seizure ds - patient was continued on Keppra. - EEG results reviewed will not increased keppra dose given that no epileptiform activity was noted on EEG. 6. E coli UTI - urine culture reviewed. Started empiric Rocephin on admission and based on sensitivities will switch to Ceftin today is day # 6.  Patient to received a total antibiotic treatment course of 10 days counting the antibiotic regimen administered in the hospital and that which will be prescribed as outpatient. 7. Hx of RLE DVT 07/26/12 - patient with protein C deficiency - s/p IVC filter , on therapy with Xarelto  8. S/p AICD - the device has fired 3 times in the past month - cardiology consult called 08/27/12 EP specialist to review and make further recommendations - Please see above for their recommendations. 9. DM type 2 - Patient to continue her home regimen and follow up with primary care physician. 10. Hypokalemia - from N/V/D - resolved with iv fluids 11. Chronic pain syndrome resumed BID oxycontin     Procedures:  EKG's  Consultations:  [facility]: Dr. Johney Frame  Discharge Exam: Filed Vitals:   08/28/12 2220 08/29/12 0600 08/29/12 0800 08/29/12 1018  BP: 120/66 124/64 126/75   Pulse: 72 71 69 76  Temp: 99 F (37.2 C) 97.9 F (36.6 C) 97.8 F (36.6 C)   TempSrc: Oral Oral Oral   Resp: 18 16 16    Weight: 60.6 kg (133 lb 9.6 oz)     SpO2: 94% 93% 94%     General: Pt in NAD, Alert and Awake, limited responses due to history of prior stroke with aphasia.   Cardiovascular: irregular rate controlled, no  murmurs Respiratory: CTA BL, no wheezes, no increased work of breathing Abdomen: Non distended, no guarding, + bowel sounds, no rebound tenderness  Discharge Instructions  Discharge Orders    Future Appointments: Provider: Department: Dept Phone: Center:   10/03/2012 1:15 PM Edwyna Perfect, MD [facility] [phone] [facility]     Future Orders Please Complete By Expires   Diet - low sodium heart healthy      Increase activity slowly      Discharge instructions      Comments:   Also follow up with you primary care physician in 1-2 weeks as you will need to get your potassium and magnesium rechecked.  Please be sure to follow up with Tereso Newcomer and one of our CHF specialists in your cardiologist office in 4-6 weeks.  The number is [phone] please call and set up appointment.  You last saw Dr. Johney Frame while in the hospital.   Call MD for:  temperature >100.4      Call MD for:  persistant nausea and vomiting      Call MD for:  persistant dizziness or light-headedness  Call MD for:  extreme fatigue          Medication List     As of 08/29/2012 10:26 AM    STOP taking these medications         furosemide 20 MG tablet   Commonly known as: LASIX      lisinopril 5 MG tablet   Commonly known as: PRINIVIL,ZESTRIL      TAKE these medications         albuterol (2.5 MG/3ML) 0.083% nebulizer solution   Commonly known as: PROVENTIL   Take 2.5 mg by nebulization every 6 (six) hours as needed. For cough or wheeze      ALPRAZolam 0.5 MG tablet   Commonly known as: XANAX   Take 0.5 mg by mouth at bedtime as needed. For sleep      amiodarone 200 MG tablet   Commonly known as: PACERONE   Take 200 mg by mouth daily.      cefUROXime 125 MG/5ML suspension   Commonly known as: CEFTIN   Take 5 mLs (125 mg total) by mouth every 12 (twelve) hours.      digoxin 0.125 MG tablet   Commonly known as: LANOXIN   Take 1 tablet (0.125 mg total) by mouth daily.       escitalopram 10 MG tablet   Commonly known as: LEXAPRO   Take 10 mg by mouth daily.      glipiZIDE 5 MG 24 hr tablet   Commonly known as: GLUCOTROL XL   Take 1 tablet (5 mg total) by mouth daily.      levETIRAcetam 500 MG tablet   Commonly known as: KEPPRA   Take 1,000 mg by mouth 2 (two) times daily.      levothyroxine 50 MCG tablet   Commonly known as: SYNTHROID, LEVOTHROID   Take 50 mcg by mouth daily.      metoprolol tartrate 25 MG tablet   Commonly known as: LOPRESSOR   Take 3 tablets (75 mg total) by mouth 2 (two) times daily.      oxyCODONE-acetaminophen 10-325 MG per tablet   Commonly known as: PERCOCET   Take 1 tablet by mouth 3 (three) times daily. Scheduled per pharmacy      Rivaroxaban 20 MG Tabs   Commonly known as: XARELTO   Take 1 tablet (20 mg total) by mouth daily with supper.      spironolactone 25 MG tablet   Commonly known as: ALDACTONE   Take 1 tablet (25 mg total) by mouth daily.          The results of significant diagnostics from this hospitalization (including imaging, microbiology, ancillary and laboratory) are listed below for reference.    Significant Diagnostic Studies: Dg Chest 2 View  08/24/2012  *RADIOLOGY REPORT*  Clinical Data: Wheezing.  Ex-smoker.  CHEST - 2 VIEW  Comparison: 07/20/2012.  Findings: Stable left subclavian pacer and AICD leads.  Stable enlarged cardiac silhouette and prominent pulmonary vasculature and interstitial markings.  Small bilateral pleural effusions. Unremarkable bones.  IMPRESSION: Stable cardiomegaly mild changes of congestive heart failure.   Original Report Authenticated By: Beckie Salts, M.D.    Dg Abd 1 View  08/24/2012  *RADIOLOGY REPORT*  Clinical Data: Abdominal pain, nausea, vomiting.  ABDOMEN - 1 VIEW  Comparison: 07/19/2012  Findings: Nonobstructive bowel gas pattern.  No supine evidence of free air.  IVC filter remains in place, unchanged.  Prior cholecystectomy.  No organomegaly or suspicious  calcification.  No acute bony abnormality.  IMPRESSION: No acute findings.   Original Report Authenticated By: Charlett Nose, M.D.    Ct Head Wo Contrast  08/27/2012  *RADIOLOGY REPORT*  Clinical Data: Lethargic with slurred speech; headache  CT HEAD WITHOUT CONTRAST  Technique:  Contiguous axial images were obtained from the base of the skull through the vertex without contrast.  Comparison: April 23, 2012  Findings:  There is ex vacuo enlargement of the left lateral ventricle.  Other ventricles are normal in size and configuration. There is no mass, hemorrhage, extra-axial fluid collection, or midline shift.  There is evidence of a prior infarct involving much of the left temporal lobe, posterior frontal lobe, and anterior parietal lobe, stable.  There is no evidence of extension of this infarct compared to prior study.  There is evidence of a prior small infarct in the mid medial left cerebellum, stable.  There is no acute appearing infarct on this study.  There are no new gray-white compartment lesions.  There is mild small vessel disease in the right centrum semiovale.  Bony calvarium appears intact.  The mastoid air cells are clear.  IMPRESSION: Prior infarcts.  No acute appearing infarct.  No hemorrhage or mass effect.  Stable ex vacuo phenomenon resulting in enlargement of the left lateral ventricle.   Original Report Authenticated By: Bretta Bang, M.D.    US Abdomen Limited  08/24/2012  *RADIOLOGY REPORT*  Clinical Data: Abdominal pain, nausea and vomiting, request for paracentesis  US ABDOMEN LIMITED  Comparison:  None  An ultrasound guided paracentesis was thoroughly discussed with the patient and questions answered.  The benefits, risks, alternatives and complications were also discussed.  The patient understands and wishes to proceed with the procedure.  Written consent was obtained.  Findings: Ultrasound imaging of the abdomen finds only very small amount of perihepatic fluid, not amenable  for paracentesis.  No other ascites noted. No procedure performed.  IMPRESSION: Trace perihepatic ascites not suitable for paracentesis.  Read by Brayton El PA-C   Original Report Authenticated By: Richarda Overlie, M.D.    Dg Abd Portable 1v  08/25/2012  *RADIOLOGY REPORT*  Clinical Data: 56 year old female with abdominal pain and vomiting.  PORTABLE ABDOMEN - 1 VIEW  Comparison: 08/24/2012 radiographs  Findings: The bowel gas pattern is normal. No dilated bowel loops are present. No suspicious calcifications are noted. An IVC filter is again noted. No acute bony abnormalities are present.  IMPRESSION: No evidence of acute abnormality - unremarkable bowel gas pattern.   Original Report Authenticated By: Harmon Pier, M.D.     Microbiology: Recent Results (from the past 240 hour(s))  URINE CULTURE     Status: Normal   Collection Time   08/24/12  5:50 AM      Component Value Range Status Comment   Specimen Description URINE, CATHETERIZED   Final    Special Requests NONE   Final    Culture  Setup Time 08/25/2012 18:17   Final    Colony Count >=100,000 COLONIES/ML   Final    Culture ESCHERICHIA COLI   Final    Report Status 08/27/2012 FINAL   Final    Organism ID, Bacteria ESCHERICHIA COLI   Final      Labs: Basic Metabolic Panel:  Lab 08/25/12 1610 08/24/12 1849 08/24/12 1215 08/24/12 0533  NA 142 143 -- 145  K 4.2 3.9 -- 2.5*  CL 107 107 -- 106  CO2 21 24 -- 25  GLUCOSE 147* 123* -- 90  BUN 12 11 -- 9  CREATININE 1.01 0.88 -- 0.70  CALCIUM 8.5 8.8 -- 9.0  MG -- -- 1.6 --  PHOS -- -- -- --   Liver Function Tests:  Lab 08/25/12 0505 08/24/12 1215 08/24/12 0533  AST 25 -- 21  ALT 12 -- 9  ALKPHOS 77 -- 69  BILITOT 1.7* -- 2.3*  PROT 7.1 7.1 7.3  ALBUMIN 2.8* -- 3.0*    Lab 08/24/12 0533  LIPASE 5*  AMYLASE --   No results found for this basename: AMMONIA:5 in the last 168 hours CBC:  Lab 08/25/12 0505 08/24/12 0533  WBC 9.0 7.8  NEUTROABS -- 5.5  HGB 10.3* 10.5*  HCT  34.8* 33.2*  MCV 87.4 82.4  PLT 215 224   Cardiac Enzymes:  Lab 08/24/12 2130 08/24/12 1849 08/24/12 1215  CKTOTAL -- -- --  CKMB -- -- --  CKMBINDEX -- -- --  TROPONINI <0.30 <0.30 <0.30   BNP: BNP (last 3 results)  Basename 08/25/12 0505 07/18/12 0240 04/23/12 0232  PROBNP 17029.0* 6251.0* 3629.0*   CBG:  Lab 08/29/12 0731 08/28/12 2235 08/28/12 1659 08/28/12 1228 08/28/12 0739  GLUCAP 82 120* 116* 130* 112*       Signed:  Penny Pia  [facility] 08/29/2012, 10:26 AM

## 2012-09-13 ENCOUNTER — Inpatient Hospital Stay (HOSPITAL_COMMUNITY)
Admission: EM | Admit: 2012-09-13 | Discharge: 2012-09-19 | DRG: 280 | Disposition: A | Discharge status: Home Health | Payer: Medicare Other | Attending: Cardiology | Admitting: Cardiology

## 2012-09-13 DIAGNOSIS — D6859 Other primary thrombophilia: Secondary | ICD-10-CM | POA: Diagnosis present

## 2012-09-13 DIAGNOSIS — Z87891 Personal history of nicotine dependence: Secondary | ICD-10-CM

## 2012-09-13 DIAGNOSIS — I69920 Aphasia following unspecified cerebrovascular disease: Secondary | ICD-10-CM

## 2012-09-13 DIAGNOSIS — I472 Ventricular tachycardia, unspecified: Secondary | ICD-10-CM | POA: Diagnosis present

## 2012-09-13 DIAGNOSIS — I428 Other cardiomyopathies: Secondary | ICD-10-CM | POA: Diagnosis present

## 2012-09-13 DIAGNOSIS — Z880 Allergy status to penicillin: Secondary | ICD-10-CM

## 2012-09-13 DIAGNOSIS — I5023 Acute on chronic systolic (congestive) heart failure: Secondary | ICD-10-CM | POA: Diagnosis present

## 2012-09-13 DIAGNOSIS — Z9119 Patient's noncompliance with other medical treatment and regimen: Secondary | ICD-10-CM

## 2012-09-13 DIAGNOSIS — R627 Adult failure to thrive: Secondary | ICD-10-CM | POA: Diagnosis present

## 2012-09-13 DIAGNOSIS — Z882 Allergy status to sulfonamides status: Secondary | ICD-10-CM

## 2012-09-13 DIAGNOSIS — Z91199 Patient's noncompliance with other medical treatment and regimen due to unspecified reason: Secondary | ICD-10-CM

## 2012-09-13 DIAGNOSIS — I4729 Other ventricular tachycardia: Secondary | ICD-10-CM | POA: Diagnosis present

## 2012-09-13 DIAGNOSIS — Z8249 Family history of ischemic heart disease and other diseases of the circulatory system: Secondary | ICD-10-CM

## 2012-09-13 DIAGNOSIS — D696 Thrombocytopenia, unspecified: Secondary | ICD-10-CM | POA: Diagnosis present

## 2012-09-13 DIAGNOSIS — E1169 Type 2 diabetes mellitus with other specified complication: Secondary | ICD-10-CM | POA: Diagnosis present

## 2012-09-13 DIAGNOSIS — Z95810 Presence of automatic (implantable) cardiac defibrillator: Secondary | ICD-10-CM

## 2012-09-13 DIAGNOSIS — I429 Cardiomyopathy, unspecified: Secondary | ICD-10-CM

## 2012-09-13 DIAGNOSIS — F3289 Other specified depressive episodes: Secondary | ICD-10-CM | POA: Diagnosis present

## 2012-09-13 DIAGNOSIS — I509 Heart failure, unspecified: Secondary | ICD-10-CM | POA: Diagnosis present

## 2012-09-13 DIAGNOSIS — Z86718 Personal history of other venous thrombosis and embolism: Secondary | ICD-10-CM

## 2012-09-13 DIAGNOSIS — R112 Nausea with vomiting, unspecified: Secondary | ICD-10-CM | POA: Diagnosis present

## 2012-09-13 DIAGNOSIS — F329 Major depressive disorder, single episode, unspecified: Secondary | ICD-10-CM | POA: Diagnosis present

## 2012-09-13 DIAGNOSIS — I4891 Unspecified atrial fibrillation: Secondary | ICD-10-CM | POA: Diagnosis present

## 2012-09-13 DIAGNOSIS — I69959 Hemiplegia and hemiparesis following unspecified cerebrovascular disease affecting unspecified side: Secondary | ICD-10-CM

## 2012-09-13 DIAGNOSIS — Z9089 Acquired absence of other organs: Secondary | ICD-10-CM

## 2012-09-13 DIAGNOSIS — G40909 Epilepsy, unspecified, not intractable, without status epilepticus: Secondary | ICD-10-CM | POA: Diagnosis present

## 2012-09-13 DIAGNOSIS — G8929 Other chronic pain: Secondary | ICD-10-CM | POA: Diagnosis present

## 2012-09-13 DIAGNOSIS — E162 Hypoglycemia, unspecified: Secondary | ICD-10-CM

## 2012-09-13 DIAGNOSIS — I69320 Aphasia following cerebral infarction: Secondary | ICD-10-CM

## 2012-09-13 DIAGNOSIS — E039 Hypothyroidism, unspecified: Secondary | ICD-10-CM | POA: Diagnosis present

## 2012-09-13 DIAGNOSIS — Z7901 Long term (current) use of anticoagulants: Secondary | ICD-10-CM

## 2012-09-13 DIAGNOSIS — E876 Hypokalemia: Secondary | ICD-10-CM | POA: Diagnosis present

## 2012-09-13 DIAGNOSIS — I214 Non-ST elevation (NSTEMI) myocardial infarction: Principal | ICD-10-CM | POA: Diagnosis present

## 2012-09-13 HISTORY — DX: Chronic systolic (congestive) heart failure: I50.22

## 2012-09-13 HISTORY — DX: Patient's noncompliance with other medical treatment and regimen due to unspecified reason: Z91.199

## 2012-09-13 HISTORY — DX: Personal history of other venous thrombosis and embolism: Z86.718

## 2012-09-13 HISTORY — DX: Patient's noncompliance with other medical treatment and regimen: Z91.19

## 2012-09-14 ENCOUNTER — Encounter (HOSPITAL_COMMUNITY): Payer: Self-pay

## 2012-09-14 ENCOUNTER — Emergency Department (HOSPITAL_COMMUNITY): Payer: Medicare Other

## 2012-09-14 DIAGNOSIS — R079 Chest pain, unspecified: Secondary | ICD-10-CM

## 2012-09-14 LAB — COMPREHENSIVE METABOLIC PANEL
Albumin: 2.9 g/dL — ABNORMAL LOW (ref 3.5–5.2)
Alkaline Phosphatase: 62 U/L (ref 39–117)
BUN: 7 mg/dL (ref 6–23)
Calcium: 8.5 mg/dL (ref 8.4–10.5)
Creatinine, Ser: 0.72 mg/dL (ref 0.50–1.10)
GFR calc Af Amer: >90 mL/min (ref >90)
Glucose, Bld: 150 mg/dL — ABNORMAL HIGH (ref 70–99)
Potassium: 2.8 mEq/L — ABNORMAL LOW (ref 3.5–5.1)
Total Protein: 7.1 g/dL (ref 6.0–8.3)

## 2012-09-14 LAB — CBC WITH DIFFERENTIAL/PLATELET
Basophils Absolute: 0 10*3/uL (ref 0.0–0.1)
Basophils Relative: 0 % (ref 0–1)
Eosinophils Absolute: 0.1 10*3/uL (ref 0.0–0.7)
HCT: 34.6 % — ABNORMAL LOW (ref 36.0–46.0)
Hemoglobin: 10.4 g/dL — ABNORMAL LOW (ref 12.0–15.0)
Lymphocytes Relative: 18 % (ref 12–46)
Lymphs Abs: 1.2 10*3/uL (ref 0.7–4.0)
MCH: 26.1 pg (ref 26.0–34.0)
MCHC: 30.1 g/dL (ref 30.0–36.0)
Monocytes Absolute: 0.6 10*3/uL (ref 0.1–1.0)
Neutro Abs: 4.6 10*3/uL (ref 1.7–7.7)
RDW: 22.3 % — ABNORMAL HIGH (ref 11.5–15.5)

## 2012-09-14 LAB — BASIC METABOLIC PANEL
BUN: 6 mg/dL (ref 6–23)
CO2: 25 mEq/L (ref 19–32)
Calcium: 8.3 mg/dL — ABNORMAL LOW (ref 8.4–10.5)
Chloride: 101 mEq/L (ref 96–112)
Creatinine, Ser: 0.69 mg/dL (ref 0.50–1.10)
GFR calc non Af Amer: >90 mL/min (ref >90)
Potassium: 2.4 mEq/L — CL (ref 3.5–5.1)
Sodium: 142 mEq/L (ref 135–145)

## 2012-09-14 LAB — POCT I-STAT TROPONIN I
Troponin i, poc: 0.2 ng/mL (ref 0.00–0.08)
Troponin i, poc: 0.18 ng/mL (ref 0.00–0.08)

## 2012-09-14 LAB — PRO B NATRIURETIC PEPTIDE: Pro B Natriuretic peptide (BNP): 15271 pg/mL — ABNORMAL HIGH (ref 0–125)

## 2012-09-14 LAB — GLUCOSE, CAPILLARY
Glucose-Capillary: 108 mg/dL — ABNORMAL HIGH (ref 70–99)
Glucose-Capillary: 60 mg/dL — ABNORMAL LOW (ref 70–99)
Glucose-Capillary: 74 mg/dL (ref 70–99)

## 2012-09-14 MED ORDER — ONDANSETRON HCL 4 MG/2ML IJ SOLN
4.0000 mg | Freq: Four times a day (QID) | INTRAMUSCULAR | Status: DC | PRN
  Administered 2012-09-16: 4 mg via INTRAVENOUS
  Filled 2012-09-14: qty 2

## 2012-09-14 MED ORDER — MAGNESIUM SULFATE 40 MG/ML IJ SOLN
2.0000 g | Freq: Once | INTRAMUSCULAR | Status: AC
  Administered 2012-09-14: 2 g via INTRAVENOUS
  Filled 2012-09-14: qty 50

## 2012-09-14 MED ORDER — METOPROLOL TARTRATE 50 MG PO TABS
75.0000 mg | ORAL_TABLET | Freq: Two times a day (BID) | ORAL | Status: DC
  Administered 2012-09-14 – 2012-09-18 (×7): 75 mg via ORAL
  Filled 2012-09-14 (×12): qty 1

## 2012-09-14 MED ORDER — MORPHINE SULFATE 2 MG/ML IJ SOLN
2.0000 mg | INTRAMUSCULAR | Status: DC | PRN
  Administered 2012-09-14 – 2012-09-17 (×7): 2 mg via INTRAVENOUS
  Filled 2012-09-14 (×6): qty 1

## 2012-09-14 MED ORDER — FENTANYL CITRATE 0.05 MG/ML IJ SOLN
50.0000 ug | Freq: Once | INTRAMUSCULAR | Status: AC
  Administered 2012-09-14: 50 ug via INTRAVENOUS
  Filled 2012-09-14: qty 2

## 2012-09-14 MED ORDER — ESCITALOPRAM OXALATE 10 MG PO TABS
10.0000 mg | ORAL_TABLET | Freq: Every day | ORAL | Status: DC
  Administered 2012-09-14 – 2012-09-15 (×2): 10 mg via ORAL
  Filled 2012-09-14 (×6): qty 1

## 2012-09-14 MED ORDER — ALPRAZOLAM 0.5 MG PO TABS
0.5000 mg | ORAL_TABLET | Freq: Every evening | ORAL | Status: DC | PRN
  Administered 2012-09-16: 0.5 mg via ORAL
  Filled 2012-09-14: qty 1

## 2012-09-14 MED ORDER — OXYCODONE-ACETAMINOPHEN 5-325 MG PO TABS
1.0000 | ORAL_TABLET | Freq: Three times a day (TID) | ORAL | Status: DC | PRN
  Administered 2012-09-17 (×2): 1 via ORAL
  Filled 2012-09-14 (×6): qty 1

## 2012-09-14 MED ORDER — POTASSIUM CHLORIDE 10 MEQ/100ML IV SOLN
10.0000 meq | INTRAVENOUS | Status: AC
Start: 2012-09-14 — End: 2012-09-14
  Administered 2012-09-14 (×4): 10 meq via INTRAVENOUS
  Filled 2012-09-14 (×4): qty 100

## 2012-09-14 MED ORDER — OXYCODONE HCL 5 MG PO TABS
5.0000 mg | ORAL_TABLET | Freq: Three times a day (TID) | ORAL | Status: DC | PRN
  Administered 2012-09-14 – 2012-09-17 (×4): 5 mg via ORAL
  Filled 2012-09-14 (×6): qty 1

## 2012-09-14 MED ORDER — DIGOXIN 125 MCG PO TABS
0.1250 mg | ORAL_TABLET | Freq: Every day | ORAL | Status: DC
  Administered 2012-09-14 – 2012-09-16 (×3): 0.125 mg via ORAL
  Filled 2012-09-14 (×6): qty 1

## 2012-09-14 MED ORDER — DIGOXIN 0.25 MG/ML IJ SOLN
0.2500 mg | Freq: Once | INTRAMUSCULAR | Status: AC
  Administered 2012-09-14: 0.25 mg via INTRAVENOUS
  Filled 2012-09-14: qty 2

## 2012-09-14 MED ORDER — OXYCODONE-ACETAMINOPHEN 10-325 MG PO TABS: 1.0000 | ORAL_TABLET | Freq: Three times a day (TID) | ORAL | Status: DC | PRN

## 2012-09-14 MED ORDER — ALBUTEROL SULFATE (5 MG/ML) 0.5% IN NEBU: 2.5000 mg | INHALATION_SOLUTION | RESPIRATORY_TRACT | Status: DC | PRN

## 2012-09-14 MED ORDER — ACETAMINOPHEN 325 MG PO TABS
650.0000 mg | ORAL_TABLET | ORAL | Status: DC | PRN
  Administered 2012-09-14: 650 mg via ORAL
  Filled 2012-09-14: qty 2

## 2012-09-14 MED ORDER — INSULIN ASPART 100 UNIT/ML ~~LOC~~ SOLN: 0.0000 [IU] | Freq: Three times a day (TID) | SUBCUTANEOUS | Status: DC

## 2012-09-14 MED ORDER — LEVETIRACETAM 500 MG PO TABS
1000.0000 mg | ORAL_TABLET | Freq: Two times a day (BID) | ORAL | Status: DC
  Administered 2012-09-14 – 2012-09-16 (×4): 1000 mg via ORAL
  Filled 2012-09-14 (×12): qty 2

## 2012-09-14 MED ORDER — QUETIAPINE FUMARATE ER 50 MG PO TB24
50.0000 mg | ORAL_TABLET | Freq: Every day | ORAL | Status: DC
  Administered 2012-09-14 – 2012-09-15 (×2): 50 mg via ORAL
  Filled 2012-09-14 (×6): qty 1

## 2012-09-14 MED ORDER — GLIPIZIDE ER 5 MG PO TB24
5.0000 mg | ORAL_TABLET | Freq: Every day | ORAL | Status: DC
  Administered 2012-09-14 – 2012-09-15 (×2): 5 mg via ORAL
  Filled 2012-09-14 (×6): qty 1

## 2012-09-14 MED ORDER — SPIRONOLACTONE 25 MG PO TABS
25.0000 mg | ORAL_TABLET | Freq: Every day | ORAL | Status: DC
  Administered 2012-09-14 – 2012-09-15 (×2): 25 mg via ORAL
  Filled 2012-09-14 (×6): qty 1

## 2012-09-14 MED ORDER — POTASSIUM CHLORIDE 10 MEQ/100ML IV SOLN
10.0000 meq | INTRAVENOUS | Status: AC
  Administered 2012-09-14 (×4): 10 meq via INTRAVENOUS
  Filled 2012-09-14 (×3): qty 100

## 2012-09-14 MED ORDER — ASPIRIN 300 MG RE SUPP
300.0000 mg | Freq: Once | RECTAL | Status: AC
  Administered 2012-09-14: 300 mg via RECTAL
  Filled 2012-09-14: qty 1

## 2012-09-14 MED ORDER — LEVOTHYROXINE SODIUM 50 MCG PO TABS
50.0000 ug | ORAL_TABLET | Freq: Every day | ORAL | Status: DC
  Administered 2012-09-14 – 2012-09-17 (×3): 50 ug via ORAL
  Filled 2012-09-14 (×7): qty 1

## 2012-09-14 MED ORDER — AMIODARONE HCL 200 MG PO TABS
200.0000 mg | ORAL_TABLET | Freq: Every day | ORAL | Status: DC
  Administered 2012-09-14 – 2012-09-15 (×2): 200 mg via ORAL
  Filled 2012-09-14 (×6): qty 1

## 2012-09-14 MED ORDER — POTASSIUM CHLORIDE CRYS ER 20 MEQ PO TBCR: 40.0000 meq | EXTENDED_RELEASE_TABLET | Freq: Once | ORAL | Status: DC

## 2012-09-14 MED ORDER — RIVAROXABAN 20 MG PO TABS
20.0000 mg | ORAL_TABLET | Freq: Every day | ORAL | Status: DC
  Administered 2012-09-16 – 2012-09-18 (×3): 20 mg via ORAL
  Filled 2012-09-14 (×6): qty 1

## 2012-09-14 NOTE — ED Notes (Signed)
Dr. Read Drivers aware of I-stat tropoinin result

## 2012-09-14 NOTE — ED Notes (Addendum)
Pt. Reports increased pain to touch on right side of body, also c/o of severe HA. Reports hx of same but states "This is the worse it has been".

## 2012-09-14 NOTE — ED Notes (Signed)
Spoke with cardiologist. [employer] representative to interrogate pacemaker.

## 2012-09-14 NOTE — Progress Notes (Signed)
Hypoglycemic Event  CBG: 66  Treatment: 15 GM carbohydrate snack  Symptoms: None  Follow-up CBG: Time:2139 CBG Result:74  Possible Reasons for Event: Unknown  Comments/MD notified:    Bartholomew Boards  Remember to initiate Hypoglycemia Order Set & complete

## 2012-09-14 NOTE — H&P (Signed)
Physician History and Physical    Elizabeth Love MRN: [id number] DOB/AGE: 10/05/1955 56 y.o. Admit date: 09/13/2012  Primary Cardiologist:  Seen By Dr. 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  Pt was admitted earlier this month for abd pain, n, v and found to have UTI.  She was taken off her lasix and lisinopril and was placed on spiro.  Cardiology saw her that admission for ICD shocks; pt was hypokalemic at that time and she is today as well.  Pt is unable to provide any meaningful history.  When asked if she has pain, she nods and when asked to point to where it hurts, she waves her hand over her head and her entire body.    Additional history from RN: she talked to pt's brother - they called EMS because pt was screaming due to pain and she vomited during the pain episode.      Review of systems: unable to obtain due to pt's mental status  Past Medical History  Diagnosis Date  . Seizures   . Hypertension   . Stroke     2007 - R sided weakness  . Asthma   . Diabetes mellitus   . 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 Thyroid disease   . LV dysfunction     Per brother, EF 17% s/p AICD 2007 (St. Jude). No hx CAD.   Past Surgical History  Procedure Date  . Cholecystectomy   . Appendectomy   . Pacemaker insertion   . Vena cava filter placement 07/19/2012    Procedure: INSERTION VENA-CAVA FILTER;  Surgeon: Larina Earthly, MD;  Location: [name] OR;  Service: Vascular;  Laterality: [name];   History   Social History  . Marital Status: Single    Spouse Name: [name]    Number of Children: [name]  . Years of  Education: [name]   Occupational History  . Not on file.   Social History Main Topics  . Smoking status: Former Games developer  . Smokeless tobacco: Not on file  . Alcohol Use: No  . Drug Use: No  . Sexually Active:    Other Topics Concern  . Not on file   Social History Narrative  . No narrative on file    Family History  Problem Relation Age of Onset  . Coronary artery disease Neg Hx      Allergies  Allergen Reactions  . Penicillins Rash  . Sulfa Antibiotics Rash     (Not in a hospital admission)  Current facility-administered medications:potassium chloride 10 mEq in 100 mL IVPB, 10 mEq, Intravenous, Q1 Hr x 4, John L Molpus, MD, Last Rate: 100 mL/hr at 09/14/12 0329, 10 mEq at 09/14/12 0329;  potassium chloride SA (K-DUR,KLOR-CON) CR tablet 40 mEq, 40 mEq, Oral, Once, Leroy Sea, MD Current outpatient prescriptions:albuterol (PROVENTIL) (2.5 MG/3ML) 0.083% nebulizer solution, Take 2.5 mg by nebulization every 6 (six) hours as needed. For cough or wheeze, Disp: , Rfl: ;  ALPRAZolam (XANAX) 0.5 MG tablet, Take 0.5 mg by mouth at bedtime as needed. For sleep, Disp: ,  Rfl: ;  amiodarone (PACERONE) 200 MG tablet, Take 200 mg by mouth daily., Disp: , Rfl:  cefUROXime (CEFTIN) 125 MG/5ML suspension, Take 5 mLs (125 mg total) by mouth every 12 (twelve) hours., Disp: 40 mL, Rfl: 0;  digoxin (LANOXIN) 0.125 MG tablet, Take 1 tablet (0.125 mg total) by mouth daily., Disp: 30 tablet, Rfl: 0;  escitalopram (LEXAPRO) 10 MG tablet, Take 10 mg by mouth daily., Disp: , Rfl: ;  glipiZIDE (GLUCOTROL XL) 5 MG 24 hr tablet, Take 1 tablet (5 mg total) by mouth daily., Disp: 30 tablet, Rfl: 1 levETIRAcetam (KEPPRA) 500 MG tablet, Take 1,000 mg by mouth 2 (two) times daily., Disp: , Rfl: ;  levothyroxine (SYNTHROID, LEVOTHROID) 50 MCG tablet, Take 50 mcg by mouth daily., Disp: , Rfl: ;  metoprolol tartrate (LOPRESSOR) 25 MG tablet, Take 3 tablets (75 mg total) by mouth 2 (two) times daily., Disp: 180  tablet, Rfl: 0 oxyCODONE-acetaminophen (PERCOCET) 10-325 MG per tablet, Take 1 tablet by mouth 3 (three) times daily. Scheduled per pharmacy, Disp: , Rfl: ;  Rivaroxaban (XARELTO) 20 MG TABS, Take 1 tablet (20 mg total) by mouth daily with supper., Disp: 30 tablet, Rfl: 0;  spironolactone (ALDACTONE) 25 MG tablet, Take 1 tablet (25 mg total) by mouth daily., Disp: 30 tablet, Rfl: 0  Physical Exam: Blood pressure 121/77, pulse 89, temperature 98.1 F (36.7 C), temperature source Oral, resp. rate 21, SpO2 100.00%.; There is no height or weight on file to calculate BMI. Temp:  [98.1 F (36.7 C)] 98.1 F (36.7 C) (12/28 0006) Pulse Rate:  [89] 89  (12/28 0200) Resp:  [21-25] 21  (12/28 0200) BP: (121-140)/(70-101) 121/77 mmHg (12/28 0200) SpO2:  [90 %-100 %] 100 % (12/28 0200)  No intake or output data in the 24 hours ending 09/14/12 0355 General: NAD Heent: MMM Neck: No JVD  CV: RRR Lungs: Clear to auscultation bilaterally anteriorly with normal respiratory effort Abdomen: Soft, nontender, nondistended Extremities:no edema Skin:no rash Neurologic: R hemiparesis, nods to questions, able to say a couple words, but no meaningful communication     Labs: No results found for this basename: CKTOTAL:4,CKMB:4,TROPONINI:4 in the last 72 hours Lab Results  Component Value Date   WBC 6.5 09/14/2012   HGB 10.4* 09/14/2012   HCT 34.6* 09/14/2012   MCV 86.9 09/14/2012   PLT 233 09/14/2012    Lab 09/14/12 0137  NA 141  K 2.8*  CL 100  CO2 28  BUN 7  CREATININE 0.72  CALCIUM 8.5  PROT 7.1  BILITOT 1.3*  ALKPHOS 62  ALT 9  AST 22  GLUCOSE 150*   No results found for this basename: CHOL, HDL, LDLCALC, TRIG     EKG: sinus, PVC, no acute ischemic changes  Echo:  8/13 Study Conclusions  - Left ventricle: The cavity size was severely dilated. Wall thickness was increased in a pattern of mild LVH. The estimated ejection fraction was 10%. Diffuse hypokinesis. - Mitral valve:  Severe regurgitation. - Left atrium: The atrium was severely dilated. - Right ventricle: The cavity size was mildly dilated. - Right atrium: The atrium was mildly dilated. - Tricuspid valve: Moderate regurgitation. - Pulmonary arteries: PA peak pressure: 39mm Hg (S). - Pericardium, extracardiac: A small, free-flowing pericardial effusion was identified. The fluid had no internal echoes.There was no evidence of hemodynamic compromise.    Radiology:  Dg Chest 2 View  09/14/2012  *RADIOLOGY REPORT*  Clinical Data: Chest pain.  CHEST - 2 VIEW  Comparison: Chest radiograph performed  08/24/2012  Findings: The lungs are well-aerated.  Vascular congestion is noted, with bilateral central and bibasilar airspace opacities, raising concern for pulmonary edema.  Superimposed pneumonia cannot be excluded.  No definite pleural effusion is characterized on the lateral view; there is no evidence of pneumothorax.  The heart is enlarged.  A pacemaker/AICD is noted at the left chest wall, with leads ending at the right atrium and right ventricle. No acute osseous abnormalities are seen.  IMPRESSION: Vascular congestion and cardiomegaly, with bilateral central and bibasilar airspace opacities, raising concern for pulmonary edema. Superimposed pneumonia cannot be excluded.   Original Report Authenticated By: Tonia Ghent, M.D.     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 ICD shock (pt with K 2.8 and PVCs and recent admit with same and had 3 ICD shocks) - will have device rep come in to interrogate device 2. HF exacerbation (pt appears euvolemic on exam, but lasix recently dc'd.  Will check BNP to help elucidate fluid status.  With her recurrent n, v, she may need to stay off lasix to avoid dehydration) 3. wonder if some of this could just be chronic pain? - there is an FYI note in EPIC regarding narcotic abuse hx and pt with known chronic R sided pain.  If pt was experiencing intense whole body pain, this could lead to heart strain and mild troponin elevation as well  4. Pt does have hx of recent DVT and is on xarelto, PE is a possibility - presumably pt is complaint with her medications.  She is not hypoxic and not tachycardic.  Will keep this on the differential  Nausea, vomiting - pt was admitted with same earlier in month, attributed to UTI - this is contributing to her hypokalemia - recheck UA, urine cx - consider ?GI or medicine consult for further w/u  HF, EF 10% - appears euvolemic on exam - CXR with poss pulm edema - check BNP - continue home spiro and BB - pt was taken off ACEi and lasix last admission for unclear reasons, may be able to restart those this admission  ICD, VT, VF - currently in sinus with PVCs - replace K - device interrogation as above - continue amio, dig, BB - dig level was suptherapeutic, so was given IV dig in ED  Hypokalemia  - pt given supplementation in ED, recheck labs at 0700 and continue to supplement as needed  CVA with R hemiparesis and aphasia - unclear why pt is not on aspirin - will need to discuss with neuro in am.  Continue xarelto  DM - continue home meds, SSI  Chronic pain - continue home regimen of oxycodone  Depression - continue home meds  Sz - continue home meds  Ppx - pt is on xarelto  Dispo - admit   Signed: Hilary Hertz, MD Cardiology Fellow 09/14/2012, 3:55 AM

## 2012-09-14 NOTE — ED Notes (Addendum)
Pt. Reports sharp intermittent right sided body pain and HA. Hx of stroke with right sided deficits. Per EMS, reported CP, N/V. Pt. Aphasic at baseline, difficult to assess. Refused IV and ASA by EMS.

## 2012-09-14 NOTE — ED Notes (Signed)
Pt. Yelling out due to HA, continuously stating "oh boy, head hurt". Pt. Updated on plan of care. Lab at bedside

## 2012-09-14 NOTE — ED Provider Notes (Signed)
History     CSN: [id number]  Arrival date & time 09/13/12  2354   First MD Initiated Contact with Patient 09/14/12 0229      Chief Complaint  Patient presents with  . Chest Pain    (Consider location/radiation/quality/duration/timing/severity/associated sxs/prior treatment) HPI Level 5 Caveat: aphasia. This is a 56 year old female status post stroke with right-sided hemi-plegia and aphasia. EMS reports that she was having chest pain earlier. It is unclear when this started. It is unclear how severe it was. EMS reports that the patient refused an IV, refused sublingual nitroglycerin and refused aspirin by mouth. She was given aspirin rectally by nursing staff. Labs ordered by protocol indicated an elevated troponin of 0.2. EMS reports she has a history of chronic right-sided pain.  Past Medical History  Diagnosis Date  . Seizures   . Hypertension   . Stroke     2007 - R sided weakness  . Asthma   . Diabetes mellitus   . 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 Thyroid disease   . LV dysfunction     Per brother, EF 17% s/p AICD 2007 (St. Jude). No hx CAD.    Past Surgical History  Procedure Date  . Cholecystectomy   . Appendectomy   . Pacemaker insertion   . Vena cava filter placement 07/19/2012    Procedure: INSERTION VENA-CAVA FILTER;  Surgeon: Larina Earthly, MD;  Location: [facility] OR;  Service: Vascular;  Laterality: [name];    Family History  Problem Relation Age of Onset  . Coronary artery disease Neg Hx     History  Substance Use Topics  . Smoking status: Former Games developer  . Smokeless tobacco: Not on file  . Alcohol Use: No    OB History    Grav Para Term Preterm Abortions TAB SAB Ect Mult Living                  Review of Systems  Unable to perform ROS   Allergies  Penicillins and Sulfa antibiotics  Home Medications   Current Outpatient Rx  Name  Route  Sig  Dispense  Refill  . ALBUTEROL SULFATE (2.5 MG/3ML) 0.083% IN NEBU   Nebulization   Take  2.5 mg by nebulization every 6 (six) hours as needed. For cough or wheeze         . ALPRAZOLAM 0.5 MG PO TABS   Oral   Take 0.5 mg by mouth at bedtime as needed. For sleep         . AMIODARONE HCL 200 MG PO TABS   Oral   Take 200 mg by mouth daily.         Marland Kitchen CEFUROXIME AXETIL 125 MG/5ML PO SUSR   Oral   Take 5 mLs (125 mg total) by mouth every 12 (twelve) hours.   40 mL   0   . DIGOXIN 0.125 MG PO TABS   Oral   Take 1 tablet (0.125 mg total) by mouth daily.   30 tablet   0   . ESCITALOPRAM OXALATE 10 MG PO TABS   Oral   Take 10 mg by mouth daily.         Marland Kitchen GLIPIZIDE ER 5 MG PO TB24   Oral   Take 1 tablet (5 mg total) by mouth daily.   30 tablet   1   . LEVETIRACETAM 500 MG PO TABS   Oral   Take 1,000 mg by mouth 2 (two) times  daily.         Marland Kitchen LEVOTHYROXINE SODIUM 50 MCG PO TABS   Oral   Take 50 mcg by mouth daily.         Marland Kitchen METOPROLOL TARTRATE 25 MG PO TABS   Oral   Take 3 tablets (75 mg total) by mouth 2 (two) times daily.   180 tablet   0   . OXYCODONE-ACETAMINOPHEN 10-325 MG PO TABS   Oral   Take 1 tablet by mouth 3 (three) times daily. Scheduled per pharmacy         . RIVAROXABAN 20 MG PO TABS   Oral   Take 1 tablet (20 mg total) by mouth daily with supper.   30 tablet   0   . SPIRONOLACTONE 25 MG PO TABS   Oral   Take 1 tablet (25 mg total) by mouth daily.   30 tablet   0     BP 121/77  Pulse 89  Temp 98.1 F (36.7 C) (Oral)  Resp 21  SpO2 100%  Physical Exam General: Well-developed, well-nourished female in no acute distress; appears older than age of record HENT: normocephalic, atraumatic Eyes: pupils equal round and reactive to light; cannot assess extraocular muscles Neck: supple Heart: regular rate and rhythm Lungs: clear to auscultation bilaterally Abdomen: soft; nondistended; nontender; bowel sounds present Extremities: No deformity; full range of motion; pulses normal Neurologic: Awake, alert; right hemiplegia  with hyperesthesia on the right; expressive aphasia Skin: Warm and dry     ED Course  Procedures (including critical care time)     MDM   Nursing notes and vitals signs, including pulse oximetry, reviewed.  Summary of this visit's results, reviewed by myself:  Labs:  Results for orders placed during the hospital encounter of 09/13/12 (from the past 24 hour(s))  CBC WITH DIFFERENTIAL     Status: Abnormal   Collection Time   09/14/12  1:37 AM      Component Value Range   WBC 6.5  4.0 - 10.5 K/uL   RBC 3.98  3.87 - 5.11 MIL/uL   Hemoglobin 10.4 (*) 12.0 - 15.0 g/dL   HCT 91.4 (*) 78.2 - 95.6 %   MCV 86.9  78.0 - 100.0 fL   MCH 26.1  26.0 - 34.0 pg   MCHC 30.1  30.0 - 36.0 g/dL   RDW 21.3 (*) 08.6 - 57.8 %   Platelets 233  150 - 400 K/uL   Neutrophils Relative 72  43 - 77 %   Lymphocytes Relative 18  12 - 46 %   Monocytes Relative 9  3 - 12 %   Eosinophils Relative 1  0 - 5 %   Basophils Relative 0  0 - 1 %   Neutro Abs 4.6  1.7 - 7.7 K/uL   Lymphs Abs 1.2  0.7 - 4.0 K/uL   Monocytes Absolute 0.6  0.1 - 1.0 K/uL   Eosinophils Absolute 0.1  0.0 - 0.7 K/uL   Basophils Absolute 0.0  0.0 - 0.1 K/uL   RBC Morphology POLYCHROMASIA PRESENT    COMPREHENSIVE METABOLIC PANEL     Status: Abnormal   Collection Time   09/14/12  1:37 AM      Component Value Range   Sodium 141  135 - 145 mEq/L   Potassium 2.8 (*) 3.5 - 5.1 mEq/L   Chloride 100  96 - 112 mEq/L   CO2 28  19 - 32 mEq/L   Glucose, Bld 150 (*) 70 -  99 mg/dL   BUN 7  6 - 23 mg/dL   Creatinine, Ser 1.61  0.50 - 1.10 mg/dL   Calcium 8.5  8.4 - 09.6 mg/dL   Total Protein 7.1  6.0 - 8.3 g/dL   Albumin 2.9 (*) 3.5 - 5.2 g/dL   AST 22  0 - 37 U/L   ALT 9  0 - 35 U/L   Alkaline Phosphatase 62  39 - 117 U/L   Total Bilirubin 1.3 (*) 0.3 - 1.2 mg/dL   GFR calc non Af Amer >90  >90 mL/min   GFR calc Af Amer >90  >90 mL/min  POCT I-STAT TROPONIN I     Status: Abnormal   Collection Time   09/14/12  1:44 AM       Component Value Range   Troponin i, poc 0.20 (*) 0.00 - 0.08 ng/mL   Comment NOTIFIED PHYSICIAN     Comment 3           DIGOXIN LEVEL     Status: Abnormal   Collection Time   09/14/12  2:27 AM      Component Value Range   Digoxin Level 0.3 (*) 0.8 - 2.0 ng/mL  POCT I-STAT TROPONIN I     Status: Abnormal   Collection Time   09/14/12  3:14 AM      Component Value Range   Troponin i, poc 0.18 (*) 0.00 - 0.08 ng/mL   Comment NOTIFIED PHYSICIAN     Comment 3             Imaging Studies: Dg Chest 2 View  09/14/2012  *RADIOLOGY REPORT*  Clinical Data: Chest pain.  CHEST - 2 VIEW  Comparison: Chest radiograph performed 08/24/2012  Findings: The lungs are well-aerated.  Vascular congestion is noted, with bilateral central and bibasilar airspace opacities, raising concern for pulmonary edema.  Superimposed pneumonia cannot be excluded.  No definite pleural effusion is characterized on the lateral view; there is no evidence of pneumothorax.  The heart is enlarged.  A pacemaker/AICD is noted at the left chest wall, with leads ending at the right atrium and right ventricle. No acute osseous abnormalities are seen.  IMPRESSION: Vascular congestion and cardiomegaly, with bilateral central and bibasilar airspace opacities, raising concern for pulmonary edema. Superimposed pneumonia cannot be excluded.   Original Report Authenticated By: Tonia Ghent, M.D.     EKG Interpretation:  Date & Time: 09/14/2012 12:01 AM  Rate: 91  Rhythm: normal sinus rhythm with multiform PVCs  QRS Axis: normal  Intervals: normal  ST/T Wave abnormalities: nonspecific T wave changes  Conduction Disutrbances:left anterior fascicular block  Narrative Interpretation:   Old EKG Reviewed: Ectopy not seen previously  [facility] consulted, will see patient in the ED.            Hanley Seamen, MD 09/14/12 [phone]

## 2012-09-14 NOTE — ED Notes (Signed)
Pt. Calling out in pain. Calling out for her son and brother. Son contacted, states "I will come up to see her in the morning". This nurse talked to patient to calm her down. Pt. States "don't go". Pt. Reassured and comforted.

## 2012-09-14 NOTE — ED Notes (Signed)
Pt. Refused IV.

## 2012-09-14 NOTE — Progress Notes (Signed)
Hypoglycemic Event  CBG: 60  Treatment: 15 GM carbohydrate snack  Symptoms: None  Follow-up CBG: Time:2111 CBG Result:66  Possible Reasons for Event: Unknown  Comments/MD notified:    Bartholomew Boards  Remember to initiate Hypoglycemia Order Set & complete

## 2012-09-14 NOTE — Progress Notes (Signed)
Patient ID: CHASTIDY RANKER, female   DOB: December 03, 1955, 56 y.o.   MRN: [id number]   Patient Name: Elizabeth Love Date of Encounter: 09/14/2012    SUBJECTIVE  Patient walked for pain meds. No other complaints but intermittently aphasic. Difficult to get a history. Potassium is still very low at 2.4 after 310 mEq runs per [employer].  CURRENT MEDS    . amiodarone  200 mg Oral Daily  . digoxin  0.125 mg Oral Daily  . escitalopram  10 mg Oral Daily  . glipiZIDE  5 mg Oral Q breakfast  . insulin aspart  0-9 Units Subcutaneous TID WC  . levETIRAcetam  1,000 mg Oral BID  . levothyroxine  50 mcg Oral QAC breakfast  . metoprolol tartrate  75 mg Oral BID  . potassium chloride  40 mEq Oral Once  . QUEtiapine  50 mg Oral Daily  . Rivaroxaban  20 mg Oral Q supper  . spironolactone  25 mg Oral Daily    OBJECTIVE  Filed Vitals:   09/14/12 0500 09/14/12 0607 09/14/12 0620 09/14/12 0952  BP: 149/79  140/96 152/85  Pulse: 90   88  Temp:   98.5 F (36.9 C) 98.1 F (36.7 C)  TempSrc:   Oral Oral  Resp: 39  20 18  Height: 5' 1.81" (1.57 m)  5\' 1"  (1.549 m)   Weight: 133 lb 9.6 oz (60.6 kg)     SpO2: 94% 94% 95% 98%   No intake or output data in the 24 hours ending 09/14/12 1018 Filed Weights   09/14/12 0500  Weight: 133 lb 9.6 oz (60.6 kg)    PHYSICAL EXAM  General: Seems to be in pain but cannot accurately verbalize. Laying on left side in fetal position. Hemiplegia noted.Marland Kitchen HEENT:  Normal. Wearing glasses  Neck: Supple without bruits or JVD. Lungs:  Resp regular and unlabored, CTA. Heart: RRR no s3, s4, or murmurs. Abdomen: Soft, non-tender, non-distended, BS + x 4.  Extremities: No clubbing, cyanosis or edema. DP/PT/Radials 2+ and equal bilaterally.  Accessory Clinical Findings  CBC  Basename 09/14/12 0137  WBC 6.5  NEUTROABS 4.6  HGB 10.4*  HCT 34.6*  MCV 86.9  PLT 233   Basic Metabolic Panel  Basename 09/14/12 0401 09/14/12 0137  NA 142 141  K 2.4* 2.8*  CL 100  100  CO2 27 28  GLUCOSE 127* 150*  BUN 7 7  CREATININE 0.68 0.72  CALCIUM 8.3* 8.5  MG -- --  PHOS -- --   Liver Function Tests  [facility] 09/14/12 0137  AST 22  ALT 9  ALKPHOS 62  BILITOT 1.3*  PROT 7.1  ALBUMIN 2.9*   No results found for this basename: LIPASE:2,AMYLASE:2 in the last 72 hours Cardiac Enzymes No results found for this basename: CKTOTAL:3,CKMB:3,CKMBINDEX:3,TROPONINI:3 in the last 72 hours BNP No components found with this basename: POCBNP:3 D-Dimer No results found for this basename: DDIMER:2 in the last 72 hours Hemoglobin A1C No results found for this basename: HGBA1C in the last 72 hours Fasting Lipid Panel No results found for this basename: CHOL,HDL,LDLCALC,TRIG,CHOLHDL,LDLDIRECT in the last 72 hours Thyroid Function Tests No results found for this basename: TSH,T4TOTAL,FREET3,T3FREE,THYROIDAB in the last 72 hours  TELE  Normal sinus rhythm  ECG    Radiology/Studies  Dg Chest 2 View  09/14/2012  *RADIOLOGY REPORT*  Clinical Data: Chest pain.  CHEST - 2 VIEW  Comparison: Chest radiograph performed 08/24/2012  Findings: The lungs are well-aerated.  Vascular congestion is noted,  with bilateral central and bibasilar airspace opacities, raising concern for pulmonary edema.  Superimposed pneumonia cannot be excluded.  No definite pleural effusion is characterized on the lateral view; there is no evidence of pneumothorax.  The heart is enlarged.  A pacemaker/AICD is noted at the left chest Larz Mark, with leads ending at the right atrium and right ventricle. No acute osseous abnormalities are seen.  IMPRESSION: Vascular congestion and cardiomegaly, with bilateral central and bibasilar airspace opacities, raising concern for pulmonary edema. Superimposed pneumonia cannot be excluded.   Original Report Authenticated By: Tonia Ghent, M.D.    Dg Chest 2 View  08/24/2012  *RADIOLOGY REPORT*  Clinical Data: Wheezing.  Ex-smoker.  CHEST - 2 VIEW  Comparison:  07/20/2012.  Findings: Stable left subclavian pacer and AICD leads.  Stable enlarged cardiac silhouette and prominent pulmonary vasculature and interstitial markings.  Small bilateral pleural effusions. Unremarkable bones.  IMPRESSION: Stable cardiomegaly mild changes of congestive heart failure.   Original Report Authenticated By: Beckie Salts, M.D.    Dg Abd 1 View  08/24/2012  *RADIOLOGY REPORT*  Clinical Data: Abdominal pain, nausea, vomiting.  ABDOMEN - 1 VIEW  Comparison: 07/19/2012  Findings: Nonobstructive bowel gas pattern.  No supine evidence of free air.  IVC filter remains in place, unchanged.  Prior cholecystectomy.  No organomegaly or suspicious calcification.  No acute bony abnormality.  IMPRESSION: No acute findings.   Original Report Authenticated By: Charlett Nose, M.D.    Ct Head Wo Contrast  08/27/2012  *RADIOLOGY REPORT*  Clinical Data: Lethargic with slurred speech; headache  CT HEAD WITHOUT CONTRAST  Technique:  Contiguous axial images were obtained from the base of the skull through the vertex without contrast.  Comparison: April 23, 2012  Findings:  There is ex vacuo enlargement of the left lateral ventricle.  Other ventricles are normal in size and configuration. There is no mass, hemorrhage, extra-axial fluid collection, or midline shift.  There is evidence of a prior infarct involving much of the left temporal lobe, posterior frontal lobe, and anterior parietal lobe, stable.  There is no evidence of extension of this infarct compared to prior study.  There is evidence of a prior small infarct in the mid medial left cerebellum, stable.  There is no acute appearing infarct on this study.  There are no new gray-white compartment lesions.  There is mild small vessel disease in the right centrum semiovale.  Bony calvarium appears intact.  The mastoid air cells are clear.  IMPRESSION: Prior infarcts.  No acute appearing infarct.  No hemorrhage or mass effect.  Stable ex vacuo phenomenon  resulting in enlargement of the left lateral ventricle.   Original Report Authenticated By: Bretta Bang, M.D.    US Abdomen Limited  08/24/2012  *RADIOLOGY REPORT*  Clinical Data: Abdominal pain, nausea and vomiting, request for paracentesis  US ABDOMEN LIMITED  Comparison:  None  An ultrasound guided paracentesis was thoroughly discussed with the patient and questions answered.  The benefits, risks, alternatives and complications were also discussed.  The patient understands and wishes to proceed with the procedure.  Written consent was obtained.  Findings: Ultrasound imaging of the abdomen finds only very small amount of perihepatic fluid, not amenable for paracentesis.  No other ascites noted. No procedure performed.  IMPRESSION: Trace perihepatic ascites not suitable for paracentesis.  Read by Brayton El PA-C   Original Report Authenticated By: Richarda Overlie, M.D.    Dg Abd Portable 1v  08/25/2012  *RADIOLOGY REPORT*  Clinical Data: 56 year old female  with abdominal pain and vomiting.  PORTABLE ABDOMEN - 1 VIEW  Comparison: 08/24/2012 radiographs  Findings: The bowel gas pattern is normal. No dilated bowel loops are present. No suspicious calcifications are noted. An IVC filter is again noted. No acute bony abnormalities are present.  IMPRESSION: No evidence of acute abnormality - unremarkable bowel gas pattern.   Original Report Authenticated By: Harmon Pier, M.D.     ASSESSMENT AND PLAN  Active Problems:  Aphasia as late effect of cerebrovascular accident  Chronic systolic heart failure  Atrial fibrillation  Hypothyroidism  Thrombocytopenia  Cardiomyopathy  DVT of leg (deep venous thrombosis)  Seizure disorder  Protein C deficiency  Ventricular tachycardia     The patient is very difficult to assess. She still has severe hypokalemia despite supplementation. Confirmed with nurse. I'm concerned about compliance outside the hospital with very low dig level not to mention her electrolyte  abnormalities. We'll ask care management to assess. We'll vigorously replace potassium and check level again after 4 more 10 mEq runs. I'll also give him. Magnesium 2 g IV. Check magnesium level in a.m. Signed, Valera Castle MD

## 2012-09-14 NOTE — ED Notes (Addendum)
Per EMS, pt reports CP, unknown onset. HX of stroke with right sided weakness and aphasia.

## 2012-09-14 NOTE — Progress Notes (Signed)
Received pt ED alert, unable to verbalized clearly words spoken. Unable to get reliable history. Ed nurse stated son will be coming in this am. VS taken . Pt made comfortable in bed.#rd set of potassium 10 meq in 100 ml given.

## 2012-09-15 LAB — GLUCOSE, CAPILLARY
Glucose-Capillary: 120 mg/dL — ABNORMAL HIGH (ref 70–99)
Glucose-Capillary: 136 mg/dL — ABNORMAL HIGH (ref 70–99)
Glucose-Capillary: 63 mg/dL — ABNORMAL LOW (ref 70–99)

## 2012-09-15 MED ORDER — POTASSIUM CHLORIDE CRYS ER 20 MEQ PO TBCR
20.0000 meq | EXTENDED_RELEASE_TABLET | Freq: Two times a day (BID) | ORAL | Status: DC
  Filled 2012-09-15 (×9): qty 1

## 2012-09-15 MED ORDER — GLUCOSE 40 % PO GEL
ORAL | Status: AC
  Filled 2012-09-15: qty 1

## 2012-09-15 MED ORDER — DEXTROSE 50 % IV SOLN: 25.0000 mL | Freq: Once | INTRAVENOUS | Status: DC

## 2012-09-15 MED ORDER — DEXTROSE 50 % IV SOLN
INTRAVENOUS | Status: AC
  Filled 2012-09-15: qty 50

## 2012-09-15 MED ORDER — MAGNESIUM OXIDE 400 (241.3 MG) MG PO TABS
400.0000 mg | ORAL_TABLET | Freq: Two times a day (BID) | ORAL | Status: DC
  Administered 2012-09-16: 400 mg via ORAL
  Filled 2012-09-15 (×10): qty 1

## 2012-09-15 NOTE — Progress Notes (Signed)
Utilization review completed.  

## 2012-09-15 NOTE — Progress Notes (Signed)
Pt refused labs and 1000 medications.  Explained the seriousness of beta blockers and kepra. Pt still refused, notified Dr. Daleen Squibb.

## 2012-09-15 NOTE — Progress Notes (Signed)
Patient ID: RORY MONTEL, female   DOB: Nov 28, 1955, 56 y.o.   MRN: [id number]   Patient Name: Elizabeth Love Date of Encounter: 09/15/2012    SUBJECTIVE  Tiffany RN states that patient has refused medications this morning and blood draws. Her weight is down 5 pounds. Laying flat without shortness of breath. Potassium checked yesterday was 3.5. She was very hypokalemic on admission.  CURRENT MEDS    . amiodarone  200 mg Oral Daily  . digoxin  0.125 mg Oral Daily  . escitalopram  10 mg Oral Daily  . glipiZIDE  5 mg Oral Q breakfast  . insulin aspart  0-9 Units Subcutaneous TID WC  . levETIRAcetam  1,000 mg Oral BID  . levothyroxine  50 mcg Oral QAC breakfast  . metoprolol tartrate  75 mg Oral BID  . QUEtiapine  50 mg Oral Daily  . Rivaroxaban  20 mg Oral Q supper  . spironolactone  25 mg Oral Daily    OBJECTIVE  Filed Vitals:   09/14/12 1349 09/14/12 1418 09/14/12 2052 09/15/12 0327  BP: 105/69 117/72 140/90 120/80  Pulse: 73 56 70 72  Temp: 97.7 F (36.5 C) 98.3 F (36.8 C) 98 F (36.7 C) 98.1 F (36.7 C)  TempSrc: Oral Oral Oral Oral  Resp: 18 18 18 18   Height:      Weight:    127 lb 10.3 oz (57.9 kg)  SpO2: 95% 98% 98% 98%    Intake/Output Summary (Last 24 hours) at 09/15/12 1210 Last data filed at 09/15/12 1016  Gross per 24 hour  Intake    600 ml  Output    100 ml  Net    500 ml   Filed Weights   09/14/12 0500 09/15/12 0327  Weight: 133 lb 9.6 oz (60.6 kg) 127 lb 10.3 oz (57.9 kg)    PHYSICAL EXAM  No change. Lungs clear. CBC  Basename 09/14/12 0137  WBC 6.5  NEUTROABS 4.6  HGB 10.4*  HCT 34.6*  MCV 86.9  PLT 233   Basic Metabolic Panel  Basename 09/14/12 1600 09/14/12 0401  NA 136 142  K 3.5 2.4*  CL 101 100  CO2 25 27  GLUCOSE 92 127*  BUN 6 7  CREATININE 0.69 0.68  CALCIUM 8.0* 8.3*  MG -- --  PHOS -- --   Liver Function Tests  [facility] 09/14/12 0137  AST 22  ALT 9  ALKPHOS 62  BILITOT 1.3*  PROT 7.1  ALBUMIN 2.9*    No results found for this basename: LIPASE:2,AMYLASE:2 in the last 72 hours Cardiac Enzymes No results found for this basename: CKTOTAL:3,CKMB:3,CKMBINDEX:3,TROPONINI:3 in the last 72 hours BNP No components found with this basename: POCBNP:3 D-Dimer No results found for this basename: DDIMER:2 in the last 72 hours Hemoglobin A1C No results found for this basename: HGBA1C in the last 72 hours Fasting Lipid Panel No results found for this basename: CHOL,HDL,LDLCALC,TRIG,CHOLHDL,LDLDIRECT in the last 72 hours Thyroid Function Tests No results found for this basename: TSH,T4TOTAL,FREET3,T3FREE,THYROIDAB in the last 72 hours  TELE Normal sinus rhythm  ECG    Radiology/Studies  Dg Chest 2 View  09/14/2012  *RADIOLOGY REPORT*  Clinical Data: Chest pain.  CHEST - 2 VIEW  Comparison: Chest radiograph performed 08/24/2012  Findings: The lungs are well-aerated.  Vascular congestion is noted, with bilateral central and bibasilar airspace opacities, raising concern for pulmonary edema.  Superimposed pneumonia cannot be excluded.  No definite pleural effusion is characterized on the lateral view; there is  no evidence of pneumothorax.  The heart is enlarged.  A pacemaker/AICD is noted at the left chest Brenya Taulbee, with leads ending at the right atrium and right ventricle. No acute osseous abnormalities are seen.  IMPRESSION: Vascular congestion and cardiomegaly, with bilateral central and bibasilar airspace opacities, raising concern for pulmonary edema. Superimposed pneumonia cannot be excluded.   Original Report Authenticated By: Tonia Ghent, M.D.    Dg Chest 2 View  08/24/2012  *RADIOLOGY REPORT*  Clinical Data: Wheezing.  Ex-smoker.  CHEST - 2 VIEW  Comparison: 07/20/2012.  Findings: Stable left subclavian pacer and AICD leads.  Stable enlarged cardiac silhouette and prominent pulmonary vasculature and interstitial markings.  Small bilateral pleural effusions. Unremarkable bones.  IMPRESSION:  Stable cardiomegaly mild changes of congestive heart failure.   Original Report Authenticated By: Beckie Salts, M.D.    Dg Abd 1 View  08/24/2012  *RADIOLOGY REPORT*  Clinical Data: Abdominal pain, nausea, vomiting.  ABDOMEN - 1 VIEW  Comparison: 07/19/2012  Findings: Nonobstructive bowel gas pattern.  No supine evidence of free air.  IVC filter remains in place, unchanged.  Prior cholecystectomy.  No organomegaly or suspicious calcification.  No acute bony abnormality.  IMPRESSION: No acute findings.   Original Report Authenticated By: Charlett Nose, M.D.    Ct Head Wo Contrast  08/27/2012  *RADIOLOGY REPORT*  Clinical Data: Lethargic with slurred speech; headache  CT HEAD WITHOUT CONTRAST  Technique:  Contiguous axial images were obtained from the base of the skull through the vertex without contrast.  Comparison: April 23, 2012  Findings:  There is ex vacuo enlargement of the left lateral ventricle.  Other ventricles are normal in size and configuration. There is no mass, hemorrhage, extra-axial fluid collection, or midline shift.  There is evidence of a prior infarct involving much of the left temporal lobe, posterior frontal lobe, and anterior parietal lobe, stable.  There is no evidence of extension of this infarct compared to prior study.  There is evidence of a prior small infarct in the mid medial left cerebellum, stable.  There is no acute appearing infarct on this study.  There are no new gray-white compartment lesions.  There is mild small vessel disease in the right centrum semiovale.  Bony calvarium appears intact.  The mastoid air cells are clear.  IMPRESSION: Prior infarcts.  No acute appearing infarct.  No hemorrhage or mass effect.  Stable ex vacuo phenomenon resulting in enlargement of the left lateral ventricle.   Original Report Authenticated By: Bretta Bang, M.D.    US Abdomen Limited  08/24/2012  *RADIOLOGY REPORT*  Clinical Data: Abdominal pain, nausea and vomiting, request for  paracentesis  US ABDOMEN LIMITED  Comparison:  None  An ultrasound guided paracentesis was thoroughly discussed with the patient and questions answered.  The benefits, risks, alternatives and complications were also discussed.  The patient understands and wishes to proceed with the procedure.  Written consent was obtained.  Findings: Ultrasound imaging of the abdomen finds only very small amount of perihepatic fluid, not amenable for paracentesis.  No other ascites noted. No procedure performed.  IMPRESSION: Trace perihepatic ascites not suitable for paracentesis.  Read by Brayton El PA-C   Original Report Authenticated By: Richarda Overlie, M.D.    Dg Abd Portable 1v  08/25/2012  *RADIOLOGY REPORT*  Clinical Data: 56 year old female with abdominal pain and vomiting.  PORTABLE ABDOMEN - 1 VIEW  Comparison: 08/24/2012 radiographs  Findings: The bowel gas pattern is normal. No dilated bowel loops are present. No suspicious  calcifications are noted. An IVC filter is again noted. No acute bony abnormalities are present.  IMPRESSION: No evidence of acute abnormality - unremarkable bowel gas pattern.   Original Report Authenticated By: Harmon Pier, M.D.     ASSESSMENT AND PLAN  Active Problems:  Aphasia as late effect of cerebrovascular accident  Chronic systolic heart failure  Atrial fibrillation  Hypothyroidism  Thrombocytopenia  Cardiomyopathy  DVT of leg (deep venous thrombosis)  Seizure disorder  Protein C deficiency  Ventricular tachycardia    Patient agrees to take her medications and have blood draws after I talked to her. She is failing to thrive outside the hospital. He's had admissions in the past for defibrillation from severe hypokalemia. She will need daily potassium and magnesium supplementation. She may need assisted living. We'll give 40 mEq potassium every morning and 400 mg of magnesium oxide twice a day. Reassess labs in the morning if she agrees with blood draw.  Signed, Valera Castle  MD

## 2012-09-15 NOTE — Progress Notes (Signed)
Hypoglycemic Event  CBG: 63 Treatment: 15 GM carbohydrate snack  Symptoms: None  Follow-up CBG: Time:2225 CBG Result:81  Possible Reasons for Event: Inadequate meal intake  Comments/MD notified:When BS was taken pt. Refused to take a 15gm carb snack. The MD was notified and he ordered 1/2 ampule of d50 however it was found the patient did not have an IV. At this time, the patient did agree to eat a snack. The follow up BS was taken 15 minutes after the patient agreed to eat the snack.    Kyheem Bathgate, Melida Quitter  Remember to initiate Hypoglycemia Order Set & complete

## 2012-09-16 LAB — BASIC METABOLIC PANEL
BUN: 7 mg/dL (ref 6–23)
Chloride: 102 mEq/L (ref 96–112)
GFR calc Af Amer: >90 mL/min (ref >90)
Potassium: 3.9 mEq/L (ref 3.5–5.1)

## 2012-09-16 LAB — GLUCOSE, CAPILLARY
Glucose-Capillary: 92 mg/dL (ref 70–99)
Glucose-Capillary: 99 mg/dL (ref 70–99)

## 2012-09-16 MED ORDER — ENSURE COMPLETE PO LIQD
237.0000 mL | Freq: Two times a day (BID) | ORAL | Status: DC
  Administered 2012-09-17: 237 mL via ORAL

## 2012-09-16 NOTE — Progress Notes (Signed)
INITIAL NUTRITION ASSESSMENT  DOCUMENTATION CODES Per approved criteria  -Not Applicable   INTERVENTION: 1. Ensure Complete po BID, each supplement provides 350 kcal and 13 grams of protein. 2. May benefit from appetite stimulant (20% weight loss in 4 months with poor appetite) 3. RD will continue to follow     NUTRITION DIAGNOSIS: Inadequate oral intake related to poor appetite as evidenced by weight loss.   Goal: PO intake to meet >/=90% estimated nutrition needs  Monitor:  PO intake, weight trends, labs   Reason for Assessment: malnutrition screening tool  56 y.o. female  Admitting Dx: chest pain  ASSESSMENT: Pt admitted with chest pain. Pt is aphasic and is not able to provide a very detailed history.  Pt reports a hx of weight loss. Unable to report amount or time frame, does not know her usual weight.  Weight hx we have on file shows pt with 32 lbs (20% body weight) loss in the past 4 months. Pt indicated that she has poor appetite and has been eating less.  Likely some level of malnutrition given extent of weight loss.   Agreeable to Ensure Complete, chocolate flavor.    Height: Ht Readings from Last 1 Encounters:  09/14/12 5\' 1"  (1.549 m)    Weight: Wt Readings from Last 1 Encounters:  09/16/12 125 lb 7.1 oz (56.9 kg)    Ideal Body Weight: 105 lbs   % Ideal Body Weight: 119%  Wt Readings from Last 10 Encounters:  09/16/12 125 lb 7.1 oz (56.9 kg)  08/28/12 133 lb 9.6 oz (60.6 kg)  08/01/12 135 lb (61.236 kg)  07/25/12 135 lb (61.236 kg)  07/22/12 135 lb 2.3 oz (61.3 kg)  07/22/12 135 lb 2.3 oz (61.3 kg)  04/24/12 157 lb 11.2 oz (71.532 kg)    Usual Body Weight: 157 lbs   % Usual Body Weight: 79%  BMI:  Body mass index is 23.70 kg/(m^2). WNL  Estimated Nutritional Needs: Kcal: 1450-1600 Protein: 55-65 gm  Fluid: 1.5-1.6 L   Skin: intact   Diet Order: Cardiac  EDUCATION NEEDS: -No education needs identified at this  time   Intake/Output Summary (Last 24 hours) at 09/16/12 1357 Last data filed at 09/16/12 1000  Gross per 24 hour  Intake    358 ml  Output      0 ml  Net    358 ml    Last BM: 12/30   Labs:   Lab 09/16/12 0857 09/14/12 1600 09/14/12 0401  NA 141 136 142  K 3.9 3.5 2.4*  CL 102 101 100  CO2 25 25 27   BUN 7 6 7   CREATININE 0.72 0.69 0.68  CALCIUM 9.2 8.0* 8.3*  MG -- -- --  PHOS -- -- --  GLUCOSE 75 92 127*    CBG (last 3)   Basename 09/16/12 1155 09/16/12 0536 09/15/12 2225  GLUCAP 92 71 81    Scheduled Meds:   . amiodarone  200 mg Oral Daily  . dextrose  25 mL Intravenous Once  . digoxin  0.125 mg Oral Daily  . escitalopram  10 mg Oral Daily  . glipiZIDE  5 mg Oral Q breakfast  . insulin aspart  0-9 Units Subcutaneous TID WC  . levETIRAcetam  1,000 mg Oral BID  . levothyroxine  50 mcg Oral QAC breakfast  . magnesium oxide  400 mg Oral BID  . metoprolol tartrate  75 mg Oral BID  . potassium chloride  20 mEq Oral BID  . QUEtiapine  50 mg Oral Daily  . Rivaroxaban  20 mg Oral Q supper  . spironolactone  25 mg Oral Daily    Continuous Infusions:   Past Medical History  Diagnosis Date  . Seizures   . Hypertension   . Stroke     2007 - R sided weakness  . Asthma   . Diabetes mellitus   . 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 Thyroid disease   . LV dysfunction     Per brother, EF 17% s/p AICD 2007 ([employer]). No hx CAD.  Marland Kitchen Dysrhythmia     atrial fibrillation-per son    Past Surgical History  Procedure Date  . Cholecystectomy   . Appendectomy   . Pacemaker insertion   . Vena cava filter placement 07/19/2012    Procedure: INSERTION VENA-CAVA FILTER;  Surgeon: Larina Earthly, MD;  Location: [facility] OR;  Service: Vascular;  Laterality: [name];  . Insert / replace / remove pacemaker     ICD  . Abdominal hysterectomy     Clarene Duke RD, LDN Pager [phone] After Hours pager [phone]

## 2012-09-16 NOTE — Progress Notes (Signed)
Subjective:  The patient is aphasic.  She has a history of noncompliance.  Again today she refused blood draws as she did yesterday.  She indicates that she has pain all throughout her body by motioning with her hand over her body. Rhythm is normal sinus rhythm.  Objective:  Vital Signs in the last 24 hours: Temp:  [97.1 F (36.2 C)-97.7 F (36.5 C)] 97.7 F (36.5 C) (12/30 0359) Pulse Rate:  [62-73] 73  (12/30 0359) Resp:  [18-20] 18  (12/30 0359) BP: (112-142)/(63-84) 142/84 mmHg (12/30 0359) SpO2:  [91 %-92 %] 92 % (12/30 0359) Weight:  [125 lb 7.1 oz (56.9 kg)] 125 lb 7.1 oz (56.9 kg) (12/30 0359)  Intake/Output from previous day: 12/29 0701 - 12/30 0700 In: 358 [P.O.:358] Out: -  Intake/Output from this shift:       . amiodarone  200 mg Oral Daily  . dextrose      . dextrose  25 mL Intravenous Once  . digoxin  0.125 mg Oral Daily  . escitalopram  10 mg Oral Daily  . glipiZIDE  5 mg Oral Q breakfast  . insulin aspart  0-9 Units Subcutaneous TID WC  . levETIRAcetam  1,000 mg Oral BID  . levothyroxine  50 mcg Oral QAC breakfast  . magnesium oxide  400 mg Oral BID  . metoprolol tartrate  75 mg Oral BID  . potassium chloride  20 mEq Oral BID  . QUEtiapine  50 mg Oral Daily  . Rivaroxaban  20 mg Oral Q supper  . spironolactone  25 mg Oral Daily      Physical Exam: The patient appears to be in no distress.  Lying flat.  Unable to speak because of expressive aphasia.  Head and neck exam reveals that the pupils are equal and reactive.  The extraocular movements are full.  There is no scleral icterus.  Mouth and pharynx are benign.  No lymphadenopathy.  No carotid bruits.  The jugular venous pressure is normal.  Thyroid is not enlarged or tender.  Chest is clear to percussion and auscultation.  No rales or rhonchi.  Expansion of the chest is symmetrical.  ICD in left upper chest.  Heart reveals no abnormal lift or heave.  First and second heart sounds are normal.   There is no murmur gallop rub or click.  The abdomen is soft and nontender.  Bowel sounds are normoactive.  There is no hepatosplenomegaly or mass.  There are no abdominal bruits.  Extremities reveal no phlebitis or edema.  Pedal pulses are good.  There is no cyanosis or clubbing.  Neurologic exam reveals expressive aphasia.  Right hemiparalysis.  Integument reveals no rash  Lab Results:  [facility] 09/14/12 0137  WBC 6.5  HGB 10.4*  PLT 233    Basename 09/14/12 1600 09/14/12 0401  NA 136 142  K 3.5 2.4*  CL 101 100  CO2 25 27  GLUCOSE 92 127*  BUN 6 7  CREATININE 0.69 0.68   No results found for this basename: TROPONINI:2,CK,MB:2 in the last 72 hours Hepatic Function Panel  Basename 09/14/12 0137  PROT 7.1  ALBUMIN 2.9*  AST 22  ALT 9  ALKPHOS 62  BILITOT 1.3*  BILIDIR --  IBILI --   No results found for this basename: CHOL in the last 72 hours No results found for this basename: PROTIME in the last 72 hours  Imaging: Imaging results have been reviewed  Cardiac Studies:  Assessment/Plan:  Patient Active Hospital Problem  List: Aphasia as late effect of cerebrovascular accident (04/23/2012)   Assessment: Expressive aphasia remains very severe and limits our ability to communicate with the patient    Chronic systolic heart failure (04/23/2012)   Assessment: Appears euvolemic    Plan: Continue same occasion  Atrial fibrillation (04/23/2012)   Assessment: Telemetry shows normal sinus rhythm   Hypothyroidism (07/18/2012)   Assessment: Euthyroid.  TSH 2.299 on 08/24/2012    Plan: Continue current therapy  Thrombocytopenia (07/19/2012)   Assessment: Platelet count was normal in November 2013   Cardiomyopathy (07/19/2012)   Assessment: No acute symptoms    Plan: Continue digoxin, metoprolol, and spironolactone  DVT of leg (deep venous thrombosis) (07/20/2012)   Assessment: No recurrence of DVT    Plan: Continue Xarelto  Seizure disorder (08/04/2012)   Assessment:  No further seizures    Plan: Continue to try to normalize blood sugars, magnesium, and potassium.this is difficult since she has been refusing blood draws  Protein C deficiency (08/25/2012)   Assessment: Continue Xarelto   Ventricular tachycardia (08/27/2012)   Assessment: ICD in place    Plan: Continue to try to normalize electrolytes   LOS: 3 days    Cassell Clement 09/16/2012, 8:45 AM

## 2012-09-16 NOTE — Progress Notes (Signed)
Pt refused all morning medications except digoxin and 25mg  of her metoprolol. Discussed with pt implications of not taking her medications. Pt verbalized understanding. Notified NP on call. Pt also complaining of pain in her head but refusing pain medications. Will try diversion and comfort measures to help with pain. Will continue to monitor pt closely.  Juliane Lack, RN

## 2012-09-16 NOTE — Progress Notes (Signed)
Patient evaluated for long-term disease management services with [facility] Care Management Program. Spoke with patient at bedside to explain [facility] Care Management services. Patient is aphasic but tried to communicate some. Was able to ascertain that patient lives with family and questionable issues with obtaining medications at home???? We discussed how going to SNF/ALF could be a good option for her upon discharge as she appears to have some compliance issues. Even during meeting, patient was initially refusing to take her po meds given by nurse. LCSW to follow up as well regarding placement. Hopefully patient will be agreeable to placement. Consents signed at bedside. Explained [facility] Care Management services do not interfere or replace any services arranged by inpatient case management or social work. Patient will receive a post discharge transition of care call and monthly home visits for assessments and for education. Will continue to follow and collaborate with inpatient staff regarding patients discharge plan.  Raiford Noble, MSN-Ed, RN,BSN [facility] Liaison [phone]

## 2012-09-16 NOTE — Progress Notes (Signed)
Inpatient Diabetes Program Recommendations  AACE/ADA: New Consensus Statement on Inpatient Glycemic Control (2013)  Target Ranges:  Prepandial:   less than 140 mg/dL      Peak postprandial:   less than 180 mg/dL (1-2 hours)      Critically ill patients:  140 - 180 mg/dL   Reason for Visit: Hypoglycemia  Results for Elizabeth Love, Elizabeth Love (MRN [id number]) as of 09/16/2012 17:13  Ref. Range 09/15/2012 05:46 09/15/2012 11:25 09/15/2012 16:40 09/15/2012 21:17 09/15/2012 22:25 09/16/2012 05:36 09/16/2012 11:55  Glucose-Capillary Latest Range: 70-99 mg/dL 244 (H) 010 (H) 272 (H) 63 (L) 81 71 92    Inpatient Diabetes Program Recommendations Oral Agents: D/C glipizide while in hospital  Note: Will follow.

## 2012-09-17 LAB — GLUCOSE, CAPILLARY
Glucose-Capillary: 87 mg/dL (ref 70–99)
Glucose-Capillary: 77 mg/dL (ref 70–99)

## 2012-09-17 MED ORDER — LISINOPRIL 5 MG PO TABS
5.0000 mg | ORAL_TABLET | Freq: Every day | ORAL | Status: DC
  Filled 2012-09-17 (×3): qty 1

## 2012-09-17 MED ORDER — DIPHENHYDRAMINE HCL 25 MG PO CAPS
25.0000 mg | ORAL_CAPSULE | Freq: Once | ORAL | Status: AC
  Administered 2012-09-17: 25 mg via ORAL
  Filled 2012-09-17: qty 1

## 2012-09-17 NOTE — Progress Notes (Signed)
Pt had 4 beat run of Vtach. Pt asymptomatic. MD made aware. No new orders given. Will continue to monitor pt closely.  Juliane Lack, RN

## 2012-09-17 NOTE — Progress Notes (Signed)
Subjective:  The patient is aphasic.  She has a history of noncompliance.  Finally agreed to having blood drawn yesterday. Rhythm is normal sinus rhythm. Had 7 beats VT yesterday. Objective:  Vital Signs in the last 24 hours: Temp:  [97 F (36.1 C)-98.1 F (36.7 C)] 97.8 F (36.6 C) (12/31 0427) Pulse Rate:  [60-96] 60  (12/31 0427) Resp:  [16-18] 18  (12/31 0427) BP: (119-142)/(70-100) 119/72 mmHg (12/31 0427) SpO2:  [95 %-100 %] 95 % (12/31 0427) Weight:  [120 lb 4.8 oz (54.568 kg)] 120 lb 4.8 oz (54.568 kg) (12/31 0427)  Intake/Output from previous day: 12/30 0701 - 12/31 0700 In: 300 [P.O.:300] Out: -  Intake/Output from this shift:       . amiodarone  200 mg Oral Daily  . dextrose  25 mL Intravenous Once  . digoxin  0.125 mg Oral Daily  . escitalopram  10 mg Oral Daily  . feeding supplement  237 mL Oral BID BM  . insulin aspart  0-9 Units Subcutaneous TID WC  . levETIRAcetam  1,000 mg Oral BID  . levothyroxine  50 mcg Oral QAC breakfast  . lisinopril  5 mg Oral Daily  . magnesium oxide  400 mg Oral BID  . metoprolol tartrate  75 mg Oral BID  . potassium chloride  20 mEq Oral BID  . QUEtiapine  50 mg Oral Daily  . Rivaroxaban  20 mg Oral Q supper  . spironolactone  25 mg Oral Daily      Physical Exam: The patient appears to be in no distress.  Lying flat.  Unable to speak because of expressive aphasia.  Head and neck exam reveals that the pupils are equal and reactive.  The extraocular movements are full.  There is no scleral icterus.  Mouth and pharynx are benign.  No lymphadenopathy.  No carotid bruits.  The jugular venous pressure is normal.  Thyroid is not enlarged or tender.  Chest is clear to percussion and auscultation.  No rales or rhonchi.  Expansion of the chest is symmetrical.  ICD in left upper chest.  Heart reveals no abnormal lift or heave.  First and second heart sounds are normal.  There is no murmur gallop rub or click.  The abdomen is soft  and nontender.  Bowel sounds are normoactive.  There is no hepatosplenomegaly or mass.  There are no abdominal bruits.  Extremities reveal no phlebitis or edema.  Pedal pulses are good.  There is no cyanosis or clubbing.  Neurologic exam reveals expressive aphasia.  Right hemiparalysis.  Integument reveals no rash  Lab Results: No results found for this basename: WBC:2,HGB:2,PLT:2 in the last 72 hours  Basename 09/16/12 0857 09/14/12 1600  NA 141 136  K 3.9 3.5  CL 102 101  CO2 25 25  GLUCOSE 75 92  BUN 7 6  CREATININE 0.72 0.69   No results found for this basename: TROPONINI:2,CK,MB:2 in the last 72 hours Hepatic Function Panel No results found for this basename: PROT,ALBUMIN,AST,ALT,ALKPHOS,BILITOT,BILIDIR,IBILI in the last 72 hours No results found for this basename: CHOL in the last 72 hours No results found for this basename: PROTIME in the last 72 hours  Imaging: Imaging results have been reviewed  Cardiac Studies:  Assessment/Plan:  Patient Active Hospital Problem List: Aphasia as late effect of cerebrovascular accident (04/23/2012)   Assessment: Expressive aphasia remains very severe and limits our ability to communicate with the patient    Chronic systolic heart failure (04/23/2012)   Assessment:  Appears euvolemic    Plan: Continue same occasion  Atrial fibrillation (04/23/2012)   Assessment: Telemetry shows normal sinus rhythm   Hypothyroidism (07/18/2012)   Assessment: Euthyroid.  TSH 2.299 on 08/24/2012    Plan: Continue current therapy  Thrombocytopenia (07/19/2012)   Assessment: Platelet count was normal in November 2013   Cardiomyopathy (07/19/2012)   Assessment: No acute symptoms    Plan: Continue digoxin, metoprolol, and spironolactone  DVT of leg (deep venous thrombosis) (07/20/2012)   Assessment: No recurrence of DVT    Plan: Continue Xarelto  Seizure disorder (08/04/2012)   Assessment: No further seizures    Plan: Continue to try to normalize blood  sugars, magnesium, and potassium.this is difficult since she has been refusing blood draws  Protein C deficiency (08/25/2012)   Assessment: Continue Xarelto   Ventricular tachycardia (08/27/2012)   Assessment: ICD in place    Plan: Continue to try to normalize electrolytes.  Overall plan should be SNF placement for later this week.   LOS: 4 days    Cassell Clement 09/17/2012, 8:18 AM

## 2012-09-17 NOTE — Progress Notes (Signed)
Utilization Review Completed.   Evalise Abruzzese, RN, BSN Nurse Case Manager  [phone]  

## 2012-09-18 DIAGNOSIS — I472 Ventricular tachycardia: Secondary | ICD-10-CM

## 2012-09-18 DIAGNOSIS — I219 Acute myocardial infarction, unspecified: Secondary | ICD-10-CM

## 2012-09-18 HISTORY — PX: INSERT / REPLACE / REMOVE PACEMAKER: SUR710

## 2012-09-18 LAB — GLUCOSE, CAPILLARY
Glucose-Capillary: 142 mg/dL — ABNORMAL HIGH (ref 70–99)
Glucose-Capillary: 116 mg/dL — ABNORMAL HIGH (ref 70–99)
Glucose-Capillary: 100 mg/dL — ABNORMAL HIGH (ref 70–99)

## 2012-09-18 NOTE — Progress Notes (Signed)
SUBJECTIVE:Aphasic but denies shortness of breath or pain.   Active Problems:  Aphasia as late effect of cerebrovascular accident  Chronic systolic heart failure  Atrial fibrillation  Hypothyroidism  Thrombocytopenia  Cardiomyopathy  DVT of leg (deep venous thrombosis)  Seizure disorder  Protein C deficiency  Ventricular tachycardia   LABS: Basic Metabolic Panel:  Basename 09/16/12 0857  NA 141  K 3.9  CL 102  CO2 25  GLUCOSE 75  BUN 7  CREATININE 0.72  CALCIUM 9.2  MG --  PHOS --     PHYSICAL EXAM BP 126/86  Pulse 59  Temp 98.1 F (36.7 C) (Oral)  Resp 18  Ht 5\' 1"  (1.549 m)  Wt 126 lb 11.2 oz (57.471 kg)  BMI 23.94 kg/m2  SpO2 94%WT Loss since admission: 7 lbs General: Well developed, well nourished, in no acute distress Head: Eyes PERRLA, No xanthomas.   Normal cephalic and atramatic  Lungs: Clear bilaterally to auscultation and percussion. Heart: HRRR S1 S2, No MRG .  Pulses are 2+ & equal.            No carotid bruit. No JVD.  No abdominal bruits. No femoral bruits. Abdomen: Bowel sounds are positive, abdomen soft and non-tender without masses or                  Hernia's noted. Msk:  Back normal, normal gait. Normal strength and tone for age. Extremities: No clubbing, cyanosis or edema.  DP +1 Neuro: Alert and oriented X 3.Aphasic right hemipareisis Psych:  Good affect, responds appropriately  TELEMETRY: Reviewed telemetry:  paced  ASSESSMENT AND PLAN:  1. Chronic Systolic CHF:No evidence of CHF on this am assessment. She has lost 7 lbs since admission. She remains on  BB, spironolactone  but do not see any other diuretic ordered. She is without complaint of shortness of breath. Plans for D/C soon to SNF.  2. Atrial fibrillation: Rate is well controlled. She is on digoxin and she is on amiodarone, metoprolol and digoxin. Xarelto at 20 mg daily. Creatinine 0.72. Recommend hemoccult stools at SNF after discharge.  3. VT: No recurrence since  08/27/12  4. CVA: Aphasic with right hemiparesis.  Bettey Mare. Lyman Bishop NP [facility] 09/18/2012, 7:36 AM  Assessment as noted above.  She continues to have occasional short runs on nonsustained VT which is asymptomatic. BMET normal yesterday. Will recheck BMET tomorrow. On exam she is sitting up and is in no distress.  Indicates left-sided headache pain. Lungs clear. Heart grade 2/6 systolic murmur at base. Plan: continue current meds. To SNF later this week hopefully if bed found.

## 2012-09-18 NOTE — Progress Notes (Signed)
Pt refused all am meds. Numerous attempts by myself and charge nurse to administer meds

## 2012-09-19 ENCOUNTER — Encounter (HOSPITAL_COMMUNITY): Payer: Self-pay | Admitting: Physician Assistant

## 2012-09-19 DIAGNOSIS — E162 Hypoglycemia, unspecified: Secondary | ICD-10-CM

## 2012-09-19 DIAGNOSIS — I5023 Acute on chronic systolic (congestive) heart failure: Secondary | ICD-10-CM

## 2012-09-19 DIAGNOSIS — I214 Non-ST elevation (NSTEMI) myocardial infarction: Secondary | ICD-10-CM

## 2012-09-19 DIAGNOSIS — G8929 Other chronic pain: Secondary | ICD-10-CM

## 2012-09-19 DIAGNOSIS — Z9119 Patient's noncompliance with other medical treatment and regimen: Secondary | ICD-10-CM

## 2012-09-19 DIAGNOSIS — Z91199 Patient's noncompliance with other medical treatment and regimen due to unspecified reason: Secondary | ICD-10-CM

## 2012-09-19 LAB — BASIC METABOLIC PANEL
BUN: 8 mg/dL (ref 6–23)
GFR calc Af Amer: >90 mL/min (ref >90)
GFR calc non Af Amer: >90 mL/min (ref >90)
Potassium: 4.7 mEq/L (ref 3.5–5.1)
Sodium: 139 mEq/L (ref 135–145)

## 2012-09-19 LAB — GLUCOSE, CAPILLARY: Glucose-Capillary: 94 mg/dL (ref 70–99)

## 2012-09-19 MED ORDER — ENSURE COMPLETE PO LIQD: 237.0000 mL | Freq: Two times a day (BID) | ORAL | Status: DC

## 2012-09-19 MED ORDER — ASPIRIN 81 MG PO TBEC: 81.0000 mg | DELAYED_RELEASE_TABLET | Freq: Every day | ORAL | Status: DC

## 2012-09-19 MED ORDER — FUROSEMIDE 20 MG PO TABS: 20.0000 mg | ORAL_TABLET | ORAL | Status: DC | PRN

## 2012-09-19 NOTE — Progress Notes (Signed)
Client has been approached several times during med pass about taking meds and has thus refused.  MD was spoken to earlier this am about refusal of medf.  Case workers have spoken to her also, but, she still refuses to accept meds.  I have explained the importance of meds in controlling heart rate, but, will not take them.

## 2012-09-19 NOTE — Progress Notes (Signed)
SUBJECTIVE:Aphasic but denies shortness of breath or pain.  She is still refusing almost all of her medication.  She has not taken her amiodarone for several days.  She had longer runs of nonsustained ventricular tachycardia last evening.  She is also refusing morning labs.  Active Problems:  Aphasia as late effect of cerebrovascular accident  Chronic systolic heart failure  Atrial fibrillation  Hypothyroidism  Thrombocytopenia  Cardiomyopathy  DVT of leg (deep venous thrombosis)  Seizure disorder  Protein C deficiency  Ventricular tachycardia   LABS: Basic Metabolic Panel: No results found for this basename: NA:2,K:2,CL:2,CO2:2,GLUCOSE:2,BUN:2,CREATININE:2,CALCIUM:2,MG:2,PHOS:2 in the last 72 hours   PHYSICAL EXAM BP 141/92  Pulse 60  Temp 98.2 F (36.8 C) (Oral)  Resp 18  Ht 5\' 1"  (1.549 m)  Wt 125 lb 3.2 oz (56.79 kg)  BMI 23.66 kg/m2  SpO2 97%WT Loss since admission: 8 lbs General: Well developed, well nourished, in no acute distress Head: Eyes PERRLA, No xanthomas.   Normal cephalic and atramatic  Lungs: Clear bilaterally to auscultation and percussion. Heart: HRRR S1 S2, No MRG .  Pulses are 2+ & equal.            No carotid bruit. No JVD.  No abdominal bruits. No femoral bruits. Abdomen: Bowel sounds are positive, abdomen soft and non-tender without masses or                  Hernia's noted. Msk:  Back normal, normal gait. Normal strength and tone for age. Extremities: No clubbing, cyanosis or edema.  DP +1 Neuro: Alert and oriented X 3.Aphasic right hemipareisis Psych:  Good affect, responds appropriately  TELEMETRY: Long runs of nonsustained monomorphic ventricular tachycardia during the night, asymptomatic according to nursing staff.  ASSESSMENT AND PLAN:  1. Chronic Systolic CHF:No evidence of CHF on this am assessment. She has lost 8 lbs since admission. She remains on  BB, spironolactone  but do not see any other diuretic ordered. She is without complaint  of shortness of breath. Plans for D/C soon to SNF.  2. Atrial fibrillation: Rate is well controlled. She is on digoxin and she is on amiodarone, metoprolol and digoxin. Xarelto at 20 mg daily. Creatinine 0.72. Recommend hemoccult stools at SNF after discharge.  3. VT: Recurrence secondary to noncompliance and refusing to take her beta blockers and her amiodarone and her digoxin and her lisinopril  4. CVA: Aphasic with right hemiparesis.  Plan: This is a very difficult situation.  She continues to refuse to take her medication.  With lack of medication we can only expect further exacerbations of her arrhythmia.  Because of her expressive aphasia communication remains difficult.  Perhaps her family members could be more involved in getting the patient to take her medication.  Social worker is working on plans for potential discharge to skilled nursing facility if patient and family are agreeable. 09/19/2012, 9:31 AM

## 2012-09-19 NOTE — Progress Notes (Addendum)
Discharge instructions given to son.  The importance of medications that assist in controlling rate explained to son and patient.  Son verbalized understanding while patient looked on.  Son stated, "I will make sure she takes her meds when she gets home".

## 2012-09-19 NOTE — Progress Notes (Signed)
Spoke with patient at bedside along with inpatient social worker to discuss patient's discharge plan. It is noted that patient's discharge plan is SNF. However, Ms Pohlman adamantly refused placement in the presence of this [facility] Liaison and the inpatient social worker. It was discussed why patient is refusing her medications and etc and patient simply shrugs. Emphasized the importance of patient helping herself by taking her medications and being compliant with her treatment in order for everyone else, [facility] Care Management services, for example, to assist her at home. Patient simply nodded her head. She gave permission for her son to be contacted via phone to discuss plans. Made inpatient case manager aware of discussion at bedside. Will continue to follow.   Raiford Noble, MSN- Ed, [name], BSN [facility] Liaison [phone]

## 2012-09-19 NOTE — Progress Notes (Signed)
The patient has refused am labs, CBG check, and has also cried and complained of headache several times throughout night shift but has refused all offers of pain meds.  Will continue to monitor patient.

## 2012-09-19 NOTE — Progress Notes (Signed)
CSW met with patient along with Gerre Scull, RN Liason for [facility].  Discussed disposition of patient for possible short term SNF and she adamantly refuses this.  Also discussed issues regarding her continued refusal to accept her daily medications as well as to allow lab draws etc.  Patient consistently is refusing all of this per nursing and patient acknowledges this.  She agreed to allow CSW to contact her son Feliz Beam who lives with her along with her brother. Son works during the day but her brother is in the home at all times.  Spoke with son who also was adamant that he did not want his mother to be placed in a nursing center and wanted to know why she has not been discharged. CSW discussed issues with son regarding her refusal of meds and treatments and he stated that this is baseline for patient. At home- she consistently refuses meds and he states that sometimes it will take 20-30 minutes to talk her into taking her meds. They also have to offer her "incentives" such as "I'll take you to the store if you take your meds."  Son feels that patient has an general understanding that she is not taking her meds and the physical results of her refusals but he does not know why she refuses. She was unable to articulate to this CSW or RN Liason either and shrug her shoulder when asked about this.  It appears that she is aware but simply chooses not to respond.  Above information given to [name] and he feels that patient does have capacity. CSW concurs with this at this time.  Son and patient are agreeable to [facility] follow up and MD states he will plan d/c of patient with HH this afternoon.  No further CSW needs identified at this time.  Anticipate future readmits to the hospital as she is non-compliant. MD may need to consider having a conversation with patient/son regarding palliative or hospice care in the future should she continue to refuse meds, tests and procedures.  [name]. [place]  [phone]

## 2012-09-19 NOTE — Discharge Summary (Signed)
Discharge Summary   Patient ID: Elizabeth Love,  MRN: [id number], DOB/AGE: December 29, 1955 57 y.o.  Admit date: 09/13/2012 Discharge date: 09/19/2012  Primary Physician: [name], [name], MD Primary Cardiologist: Hillis Range, MD (EP)  Discharge Diagnoses Principal Problem:  *Acute on chronic systolic CHF (congestive heart failure)  - admission weight: 133 lbs  - discharge weight: 125 lbs  - continued on ACEi/BB/spiro/Lasix PRN Active Problems:  Noncompliance  Chronic pain  Aphasia as late effect of cerebrovascular accident  - low-dose ASA started  Atrial fibrillation  - continued on Xarelto  Hypothyroidism  Thrombocytopenia  Cardiomyopathy  Protein C deficiency  DVT of leg (deep venous thrombosis)   - IVC filter in place  Seizure disorder  - Quiescent this admission  Hypokalemia  Ventricular tachycardia  Non-ST elevation MI (NSTEMI)  Nausea and vomiting  Failure to thrive  Hypoglycemia   Allergies Allergies  Allergen Reactions  . Penicillins Rash  . Sulfa Antibiotics Rash    Diagnostic Studies/Procedures  PA/LATERAL CHEST X-RAY - 09/14/12  IMPRESSION:  Vascular congestion and cardiomegaly, with bilateral central and  bibasilar airspace opacities, raising concern for pulmonary edema.  Superimposed pneumonia cannot be excluded.  History of Present Illness  Elizabeth Love is a 57yo female who was admitted to [facility] on 09/13/12 with the above problem list. 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 She had been seen earlier in 08/2012 for abdominal pain, nausea, vomiting and was found to have a UTI. She was seen by EP that admission for ICD shocks in the setting of hypokalemia. She was taken off her Lasix and lisinopril and started on spironolactone. The former  medications were discontinued to unclear reasons. She lives with her sons and reportedly had an episode of chest pain leading to her ED presentation. History had proved to be quite difficult to obtain given her aphasia. When asked if she had pain, she nodded her head. When asked where, she waved her hand over her head and entire body. EKG revealed no ischemic changes, but frequent PVCs were noted. Initial troponin-I returned mildly elevated at 0.20 then began to downtrend. pBNP returned at [address]. CXR as above indicated changes consistent with CHF. BMET did reveal a marked hypokalemia. Differential diagnoses of her chest pain included ICD shock in the setting of hypokalemia given recent history of this, acute on chronic systolic CHF and chronic pain. She was admitted for further evaluation/management.   Hospital Course   Rectal ASA was administered. She was started on morphine PRN for pain. Despite elevated BNP, she appeared euvolemic on exam and was oxygenating well without increased respiratory effort. Potassium was replaced, but was slow to improve. Lasix was deferred initially given recent nausea and vomiting at home to which hypokalemia was attributed. Despite holding diuresis, her weight decreased. She remained stable from a respiratory standpoint and was able to breathe lying flat. Suspicion was that NSTEMI was in the setting of A/C systolic CHF. She did have intermittent episodes of hypoglycemia on SSI which were treated with dextrose to resolution.   Elucidating more history from the patient via her family members, they noted recent history of persistent noncompliance with medications at home. They constantly must coax her with rewards to take her medications (for example, offering to go to the store if she took her medications, etc). This  was displayed on numerous occasions throughout the admission, she refused medications and blood draws despite education and recommendations from her providers and  staff. She did exhibit features of deconditioning and failure to thrive. SNF/ALF placement was recommended, however the patient adamantly refused despite multiple conversations with social workers and [facility] care representatives. She was deemed mentally competent to refuse such care. Her constellation of symptoms were suspected to stem from noncompliance. During the admission, she refused amiodarone and increased ventricular ectopy and NSVT was apparent on telemetry monitoring. [name] spent several days following the patient in an effort to guide the best course of action for her continued long term care. She consistently refused long term facility assistance, but did agree to home health nursing which was arranged.   Today, he deemed her medically appropriate for discharge, and that she has received maximal hospital benefit in the setting of her ongoing refusal to comply with care. She will be discharged today with [facility] RN assistance. She actually lost 8 lbs this admission from suspected natural diuresis. Unless her compliance improves, her long-term prognosis is quite poor. [name] has supported hospice/palliative care should her noncompliance lead to subsequent and frequent re-admissions. She will be discharged on all prior cardiac medications including ACEi. Lasix has been prescribed on a PRN basis for signs/symptoms of volume overload. She will be started on a low-dose ASA given history of CVA. She has a follow-up appointment already scheduled in the office in </= 7 days. This information, including supplemental heart failure education, has been clearly outlined in the discharge AVS. Of note, there will need to be careful monitoring of electrolyte status especially in the setting of digoxin use.   Discharge Vitals:  Blood pressure 141/92, pulse 60, temperature 98.2 F (36.8 C), temperature source Oral, resp. rate 18, height 5\' 1"  (1.549 m), weight 56.79 kg (125 lb 3.2 oz), SpO2 97.00%.    Labs:  Lab 09/19/12 1146 09/16/12 0857 09/14/12 1600 09/14/12 0137  NA 139 141 136 --  K 4.7 3.9 3.5 --  CL 103 102 101 --  CO2 27 25 25  --  BUN 8 7 6  --  CREATININE 0.76 0.72 0.69 --  CALCIUM 9.0 9.2 8.0* --  PROT -- -- -- 7.1  BILITOT -- -- -- 1.3*  ALKPHOS -- -- -- 62  ALT -- -- -- 9  AST -- -- -- 22  AMYLASE -- -- -- --  LIPASE -- -- -- --  GLUCOSE 89 75 92 --   Disposition:  Discharge Orders    Future Appointments: Provider: Department: Dept Phone: Center:   09/26/2012 2:20 PM Beatrice Lecher, PA [facility] [place] Main [facility] [place]) [phone] [facility]   10/03/2012 1:15 PM Edwyna Perfect, MD [facility] at  [facility] [phone] [id number]         Follow-up Information    Follow up with Tereso Newcomer, PA. (At 2:20 PM for follow-up. )    Contact information:   [address] [place] [address] [phone]          Discharge Medications:    Medication List     As of 09/19/2012  1:27 PM    ASK your doctor about these medications         albuterol (2.5 MG/3ML) 0.083% nebulizer solution   Commonly known as: PROVENTIL      ALPRAZolam 0.5 MG tablet   Commonly known as: XANAX      amiodarone 200 MG tablet   Commonly  known as: PACERONE      cefUROXime 125 MG/5ML suspension   Commonly known as: CEFTIN   Take 5 mLs (125 mg total) by mouth every 12 (twelve) hours.      digoxin 0.125 MG tablet   Commonly known as: LANOXIN   Take 1 tablet (0.125 mg total) by mouth daily.      escitalopram 10 MG tablet   Commonly known as: LEXAPRO      glipiZIDE 5 MG 24 hr tablet   Commonly known as: GLUCOTROL XL   Take 1 tablet (5 mg total) by mouth daily.      levETIRAcetam 500 MG tablet   Commonly known as: KEPPRA      levothyroxine 50 MCG tablet   Commonly known as: SYNTHROID, LEVOTHROID      lisinopril 5 MG tablet   Commonly known as: PRINIVIL,ZESTRIL      metoprolol tartrate 25 MG tablet   Commonly known as: LOPRESSOR    Take 3 tablets (75 mg total) by mouth 2 (two) times daily.      oxyCODONE-acetaminophen 10-325 MG per tablet   Commonly known as: PERCOCET      QUEtiapine 50 MG Tb24   Commonly known as: SEROQUEL XR      Rivaroxaban 20 MG Tabs   Commonly known as: XARELTO   Take 1 tablet (20 mg total) by mouth daily with supper.      spironolactone 25 MG tablet   Commonly known as: ALDACTONE   Take 1 tablet (25 mg total) by mouth daily.        Outstanding Labs/Studies: BMET in 1 week   Duration of Discharge Encounter: Greater than 30 minutes including physician time.  Signed, R. Hurman Horn, PA-C 09/19/2012, 1:27 PM

## 2012-09-19 NOTE — Progress Notes (Signed)
09/19/12 1330 In to speak with pt. about [facility] services.  Pt. has had a Hx of CVA.  Offered choice of HH agencies to pt. for pt. and she nodded and said yes.  Asked permission to call her son, and she agreed.  TC to son, Faith Patricelli [phone]) to obtain permission for [facility] and he chose [facility].  He stated he could come pick pt. up at dc. TC to Lupita Leash, with [facility], to give referral for [facility] RN and [facility] aide.  As per her son Feliz Beam, the pt. receives services from [facility] thru [facility].  Pt. to dc home today. Tera Mater, RN, BSN NCM [phone]

## 2012-09-19 NOTE — Progress Notes (Signed)
Please see my note from earlier today as well as assessments by [facility] care management and by clinical social worker today. Patient has reached maximal hospital benefit by virtue of her ongoing refusal to take her meds here in the hospital or to allow blood tests etc. She is mentally competent to make decisions.Will discharge today to the care of her family who are able to offer her "incentives" and rewards for taking her meds. Will continue current cardiac meds at discharge. Unless her compliance improves her long-term prognosis is very poor. Subsequent admissions may need to consider hospice/palliative care.  I have signed a face-to-face for [facility].

## 2012-09-19 NOTE — Progress Notes (Signed)
Patient d/c'd home with her son this afternoon.  [facility] will follow.  CSW signing off.  [name]. [place]  [phone]

## 2012-09-23 ENCOUNTER — Telehealth: Payer: Self-pay | Admitting: *Deleted

## 2012-09-23 NOTE — Telephone Encounter (Signed)
Caller reports that pt was in hospital for CHF and was given orders for Nursing only [w/[facility]]; reports pt & pt's family would like to get physician orders for PT added d/t weakness, can take Verbal/SLS Please advise.

## 2012-09-23 NOTE — Telephone Encounter (Signed)
Verbal Authorization for PT given to nurse via phone per [facility]/SLS

## 2012-09-23 NOTE — Telephone Encounter (Signed)
Ok for PT 

## 2012-09-26 ENCOUNTER — Encounter: Payer: Self-pay | Admitting: Physician Assistant

## 2012-09-26 ENCOUNTER — Other Ambulatory Visit: Payer: Self-pay | Admitting: *Deleted

## 2012-09-26 ENCOUNTER — Other Ambulatory Visit (INDEPENDENT_AMBULATORY_CARE_PROVIDER_SITE_OTHER): Payer: Medicare Other

## 2012-09-26 ENCOUNTER — Ambulatory Visit (INDEPENDENT_AMBULATORY_CARE_PROVIDER_SITE_OTHER): Payer: Medicare Other | Admitting: Physician Assistant

## 2012-09-26 VITALS — BP 118/74 | HR 79 | Ht 62.0 in | Wt 119.0 lb

## 2012-09-26 DIAGNOSIS — I5022 Chronic systolic (congestive) heart failure: Secondary | ICD-10-CM

## 2012-09-26 DIAGNOSIS — E876 Hypokalemia: Secondary | ICD-10-CM

## 2012-09-26 DIAGNOSIS — D6859 Other primary thrombophilia: Secondary | ICD-10-CM

## 2012-09-26 DIAGNOSIS — Z8673 Personal history of transient ischemic attack (TIA), and cerebral infarction without residual deficits: Secondary | ICD-10-CM

## 2012-09-26 DIAGNOSIS — I4891 Unspecified atrial fibrillation: Secondary | ICD-10-CM

## 2012-09-26 DIAGNOSIS — I472 Ventricular tachycardia: Secondary | ICD-10-CM

## 2012-09-26 DIAGNOSIS — R0989 Other specified symptoms and signs involving the circulatory and respiratory systems: Secondary | ICD-10-CM

## 2012-09-26 NOTE — Patient Instructions (Addendum)
You have been referred to DR. KLEIN TO BE SEEN IN 3 MONTHS; Pt has AICD 2007 IN [place]; EF 17%. WE WILL TRY TO OBTAIN RECORDS FROM [place]  LABS TODAY; BMET  PLEASE FOLLOW UP 1 MONTH WITH Gortney WEAVER, PAC SAME DAY DR. Antoine Poche and DR. MCLEAN ARE BOTH IN THE OFFICE

## 2012-09-26 NOTE — Progress Notes (Signed)
[address] [place], [place]  [address] Phone: [phone], Fax:  [phone]  Date:  09/26/2012   Name:  Elizabeth Love   DOB:  04/07/1956   MRN:  [id number]  PCP:  [name], [name], MD  Primary Cardiologist:  new Primary Electrophysiologist:  Dr. [name]    History of Present Illness: Elizabeth Love is a 57 y.o. female who returns for follow up after several recent hospital admissions.  This is a quite complex patient with a significant past medical history. She has a history of dilated cardiomyopathy with chronic systolic CHF, status post AICD implantation. She also has a history of hypothyroidism, seizure disorder, HTN, DM2.  Her prior care was provided in [facility] and her device was implanted around 2007 are 2008. She also had a stroke that has left her with residual right-sided weakness and expressive aphasia. She moved to [place] 2-3 years ago. She was initially evaluated by our team in 04/2012. She was transferred from the emergency room in [facility] with some mental status changes and abdominal pain. She was noted to be in atrial fibrillation with rapid ventricular rate. Chest CT was negative for pulmonary embolism. She did have some evidence of volume overload. Echocardiogram 8/13: Mild LVH, EF 10%, severe LAE, mild RVE, moderate TR, PASP 39, small pericardial effusion. She has had significant problems with behavioral issues likely related to her stroke. She was not felt to be a long-term anticoagulation candidate at that time. She had subsequent admission to the hospital in 10/13. She was diagnosed with a right lower extremity DVT. Workup was significant for protein C deficiency and she was seen by hematology. She has been placed on Xarelto. She was then admitted twice in 12/13. She was seen for abdominal pain in the setting of urinary tract infection. Cardiology was asked to evaluate her do to ventricular tachycardia and subsequent ICD shock  x5. She was noted to be hypokalemic. She was seen by Dr. Graciela Husbands in consultation as well for EP. Recommendation was to add Aldactone to keep her potassium normal. Her metoprolol was also adjusted. Amiodarone was continued. Her most recent hospitalization was 12/27-1/2 with symptoms consistent with acute on chronic systolic heart failure. History has always been difficult to obtain from the patient due to her expressive aphasia. She had a significantly elevated BNP and evidence of edema on her chest x-ray. Her symptom complex was felt to likely be related to noncompliance with medications. Of note, she did have mildly elevated troponins. This was likely felt to be related to strain from acute on chronic systolic CHF. She was not treated with diuretics. However, she had spontaneous diuresis and significant weight loss from 133-125 pounds. There were significant issues during her hospitalization trying to get her to take her medications. Her family noted that they often have to coax her to take her medications with rewards. [name] followed her quite closely and there were multiple discussions regarding long-term facility care but she refused this. She was set up for home health.  She is here today with her brother. Her weights have been coming down since she came home. She has been taking all of her medications. She denies significant dyspnea. She denies orthopnea, PND or edema. She denies chest pain. She denies syncope. She denies any ICD shocks.  Labs (12/13):  TSH 2.299, ALT 9 Labs (1/14):     K 4.7, creatinine 0.76  Wt Readings from Last 3 Encounters:  09/26/12 119  lb (53.978 kg)  09/19/12 125 lb 3.2 oz (56.79 kg)  08/28/12 133 lb 9.6 oz (60.6 kg)     Past Medical History  Diagnosis Date  . Seizures   . Hypertension   . Stroke     2007 - R sided weakness  . Asthma   . Diabetes mellitus   . 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 Thyroid disease   . Chronic systolic CHF (congestive  heart failure)     Per brother, EF 17% s/p AICD 2007 ([employer]). No hx CAD.  Marland Kitchen Dysrhythmia     atrial fibrillation-per son  . Coagulopathy     "Protein C deficiency"  . History of DVT (deep vein thrombosis)     s/p IVC filter  . Noncompliance     Current Outpatient Prescriptions  Medication Sig Dispense Refill  . albuterol (PROVENTIL) (2.5 MG/3ML) 0.083% nebulizer solution Take 2.5 mg by nebulization every 6 (six) hours as needed. For cough or wheeze      . ALPRAZolam (XANAX) 0.5 MG tablet Take 0.5 mg by mouth at bedtime as needed. For sleep      . amiodarone (PACERONE) 200 MG tablet Take 200 mg by mouth daily.      Marland Kitchen aspirin 81 MG EC tablet Take 1 tablet (81 mg total) by mouth daily. Swallow whole.  30 tablet  12  . digoxin (LANOXIN) 0.125 MG tablet Take 1 tablet (0.125 mg total) by mouth daily.  30 tablet  0  . escitalopram (LEXAPRO) 10 MG tablet Take 10 mg by mouth daily.      . feeding supplement (ENSURE COMPLETE) LIQD Take 237 mLs by mouth 2 (two) times daily between meals.      . furosemide (LASIX) 20 MG tablet Take 1 tablet (20 mg total) by mouth as needed (For sudden weight gain, swelling or shortness of breath).  30 tablet  3  . glipiZIDE (GLUCOTROL XL) 5 MG 24 hr tablet Take 1 tablet (5 mg total) by mouth daily.  30 tablet  1  . levETIRAcetam (KEPPRA) 500 MG tablet Take 1,000 mg by mouth 2 (two) times daily.      Marland Kitchen levothyroxine (SYNTHROID, LEVOTHROID) 50 MCG tablet Take 50 mcg by mouth daily.      Marland Kitchen lisinopril (PRINIVIL,ZESTRIL) 5 MG tablet Take 5 mg by mouth daily.      . metoprolol tartrate (LOPRESSOR) 25 MG tablet Take 3 tablets (75 mg total) by mouth 2 (two) times daily.  180 tablet  0  . oxyCODONE-acetaminophen (PERCOCET) 10-325 MG per tablet Take 1 tablet by mouth 3 (three) times daily. Scheduled per pharmacy      . QUEtiapine (SEROQUEL XR) 50 MG TB24 Take 50 mg by mouth daily.      . Rivaroxaban (XARELTO) 20 MG TABS Take 1 tablet (20 mg total) by mouth daily with supper.   30 tablet  0  . spironolactone (ALDACTONE) 25 MG tablet Take 1 tablet (25 mg total) by mouth daily.  30 tablet  0    Allergies: Allergies  Allergen Reactions  . Penicillins Rash  . Sulfa Antibiotics Rash    Social History:  The patient  reports that she has quit smoking. She does not have any smokeless tobacco history on file. She reports that she does not drink alcohol or use illicit drugs.   ROS:  Please see the history of present illness.   Limited by her expressive aphasia.   PHYSICAL EXAM: VS:  BP  118/74  Pulse 79  Ht 5\' 2"  (1.575 m)  Wt 119 lb (53.978 kg)  BMI 21.77 kg/m2  SpO2 97% Well nourished, well developed, in no acute distress HEENT: normal Neck: no JVD at 90 Cardiac:  normal S1, S2; RRR; no murmur Lungs:  clear to auscultation bilaterally, no wheezing, rhonchi or rales Abd: soft, nontender, no hepatomegaly Ext: no edema Skin: warm and dry Neuro:  Right-sided weakness noted  EKG:  NSR, HR 79, interventricular conduction delay, PR interval 208, no significant change compared to prior tracings     ASSESSMENT AND PLAN:  1. 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 I do not see a record of cardiac catheterization obtained from her prior care facility. I will request records. Her volume appears to be stable. We will obtain a followup basic metabolic panel today. Continue current medications.  2. Ventricular Tachycardia:   As noted, we will check a basic metabolic panel today. Recommendation has been to keep her potassium above 3.9 her magnesium of 1.9. I will also arrange followup with Dr. Graciela Husbands for EP.  3. Hypertension: Controlled.  4. Atrial Fibrillation:  She is maintaining sinus rhythm. Given her hypercoagulable state, she requires ongoing anticoagulation therapy. She is on Xarelto under the direction of hematology.  5. Hypercoagulable State:  She has an IVC filter. She also remains on Xarelto.  6. Prior Stroke:  She  remains on aspirin.  7. Disposition:   I will bring her back in followup with me in one month. I will review her case with both Dr. Antoine Poche and Dr. Shirlee Latch. I will then get her established with one of them for her ongoing care for CHF. I am not certain that she is a candidate for advance therapies with her CHF.  At this point she appears to have a good support system at home. If it appears that this is breaking down, she might benefit from referral to the [facility] clinic. As noted, followup will be arranged with Dr. Graciela Husbands as well 3 months.  Signed, Tereso Newcomer, PA-C  2:31 PM 09/26/2012

## 2012-09-27 ENCOUNTER — Telehealth: Payer: Self-pay | Admitting: *Deleted

## 2012-09-27 NOTE — Telephone Encounter (Signed)
Spoke w/Amy [[facility]] and gave Vo per [facility] for Social Worker to A&E home situation for medical needs; nurse reports that she had long discussion with family today about non-compliance [what it means & what the consequences of could be]; pt still out of medication [spironolactone] and family states they stopped giving pt this medication & her furosemide; nurse explained the importance of these medications [with her ED visits for Hypokalemic] and the medical risks/SLS

## 2012-09-27 NOTE — Telephone Encounter (Signed)
Nurse from [facility] reports that patient's home situation is "a hot mess" and that pt has been d/c at least twice in the past for non-complaince; also that in the past [facility] had put in for "Adult Protective Services Referral". Caller reports that family does not give medication as ordered, pt was out of spironolactone on Monday morning & was told by pt's son that medication "had been ordered"; when nurse returned to home on Wednesday, the pt still had no spironolactone and weight had increased [3] lbs. Nurse states that she is suppose to see pt again this afternoon and is requesting an Order for Child psychotherapist to go out to home and evaluate the situation: states that pt "is living with mother, who is legally blind & almost deaf, son who works [[employer]] and a brother who 'is not in very good health'"/SLS Please advise.

## 2012-09-27 NOTE — Telephone Encounter (Signed)
Yes. agree

## 2012-10-01 ENCOUNTER — Telehealth: Payer: Self-pay | Admitting: *Deleted

## 2012-10-01 NOTE — Telephone Encounter (Signed)
Message copied by Tarri Fuller on Tue Oct 01, 2012 11:09 AM ------      Message from: [place], [place] T      Created: Tue Oct 01, 2012 10:11 AM       BMET never done.      Looks like social work has gone out to assess home situation.      She has HHRN as well.      Can we check with HHRN to get BMET done?      Tereso Newcomer, PA-C  10:11 AM 10/01/2012

## 2012-10-01 NOTE — Progress Notes (Signed)
s/w HHRN and gave v/o for bmet to be done on next visit w/results faxed to [employer]. PAC, RN verbalized understanding today

## 2012-10-01 NOTE — Progress Notes (Signed)
s/w HHRN and gave v/o for bmet to be done on next visit w/results faxed to [facility]. PAC, RN verbalized understanding today 

## 2012-10-01 NOTE — Telephone Encounter (Signed)
s/w HHRN and gave v/o for bmet to be done on next visit w/results faxed to [facility]. PAC, RN verbalized understanding today 

## 2012-10-03 ENCOUNTER — Ambulatory Visit: Payer: Medicare Other | Admitting: Internal Medicine

## 2012-10-03 DIAGNOSIS — Z0289 Encounter for other administrative examinations: Secondary | ICD-10-CM

## 2012-10-04 ENCOUNTER — Encounter: Payer: Self-pay | Admitting: Internal Medicine

## 2012-11-05 ENCOUNTER — Ambulatory Visit (INDEPENDENT_AMBULATORY_CARE_PROVIDER_SITE_OTHER): Payer: Medicare Other | Admitting: Physician Assistant

## 2012-11-05 ENCOUNTER — Encounter: Payer: Self-pay | Admitting: Physician Assistant

## 2012-11-05 VITALS — BP 128/86 | HR 70 | Ht 62.0 in | Wt 116.8 lb

## 2012-11-05 DIAGNOSIS — I5022 Chronic systolic (congestive) heart failure: Secondary | ICD-10-CM

## 2012-11-05 DIAGNOSIS — I4891 Unspecified atrial fibrillation: Secondary | ICD-10-CM

## 2012-11-05 DIAGNOSIS — D6859 Other primary thrombophilia: Secondary | ICD-10-CM

## 2012-11-05 DIAGNOSIS — I472 Ventricular tachycardia: Secondary | ICD-10-CM

## 2012-11-05 DIAGNOSIS — R0602 Shortness of breath: Secondary | ICD-10-CM

## 2012-11-05 DIAGNOSIS — R51 Headache: Secondary | ICD-10-CM

## 2012-11-05 NOTE — Progress Notes (Signed)
[address] [place], [place]  [address] Phone: [phone], Fax:  [phone]  Date:  11/05/2012   ID:  BETTE BRIENZA, DOB May 07, 1956, MRN [id number]  PCP:  [name], [name], MD  Primary Cardiologist:  Dr. Rollene Rotunda (will be established) Primary Electrophysiologist:  Dr. [name]     History of Present Illness: Elizabeth Love is a 57 y.o. female who returns for follow up.  This is a quite complex patient with a significant past medical history. She has a history of dilated cardiomyopathy with chronic systolic CHF, status post AICD implantation. She also has a hx of hypothyroidism, seizure disorder, HTN, DM2. Her prior care was provided in [facility] and her device was implanted around 2007 or 2008. She also had a stroke that has left her with residual right-sided weakness and expressive aphasia. She moved to [place] 2-3 years ago.   She was initially evaluated by our team in 04/2012. She was transferred from the emergency room in [facility] with some mental status changes and abdominal pain. She was noted to be in AFib with RVR. Chest CT was negative for pulmonary embolism. She did have some evidence of volume overload. Echocardiogram 8/13: Mild LVH, EF 10%, severe LAE, mild RVE, moderate TR, PASP 39, small pericardial effusion. She has had significant problems with behavioral issues likely related to her stroke. She was not felt to be a long-term anticoagulation candidate at that time. She had subsequent admission to the hospital in 10/13. She was diagnosed with a right lower extremity DVT. Workup was significant for protein C deficiency and she was seen by hematology. She is on Xarelto. She was then admitted twice in 12/13. She was seen for abdominal pain in the setting of urinary tract infection. Cardiology was asked to evaluate her do to ventricular tachycardia and subsequent ICD shock x5. She was noted to be hypokalemic. She was seen by Dr. Graciela Husbands  in consultation as well for EP. Recommendation was to add Aldactone to keep her potassium normal. Her metoprolol was also adjusted. Amiodarone was continued.   Her most recent hospitalization was 12/27-1/2 with symptoms consistent with a/c systolic CHF. Hx has always been difficult to obtain from the patient due to her expressive aphasia. She had a significantly elevated BNP and evidence of edema on her chest x-ray. Her symptom complex was felt to likely be related to noncompliance with medications. Of note, she did have mildly elevated troponins. This was likely felt to be related to strain from a/c systolic CHF. She was not treated with diuretics. However, she had spontaneous diuresis and significant weight loss from 133-125 pounds. There were significant issues during her hospitalization trying to get her to take her medications. Her family noted that they often have to coax her to take her medications with rewards. [name] followed her quite closely and there were multiple discussions regarding long-term facility care but she refused this. She was set up for home health.   I saw her in followup 09/26/12. I reviewed her case with Dr. Antoine Poche who has agreed to see her long-term.  We have requested records from her prior cardiologist. I do not see that those have been received yet.  She is here with her brother who helps with the hx.  She has been having severe headaches for 3 weeks.  Apparently went to the ED last week in [place].  States Head CT was neg for bleed.  No further evaluation of headaches.  She is not that active.  Probably NYHA Class III.  Thinks dyspnea may be worse.  Weights are unchanged.  Has occasional CP.  Notes eval in [facility] ED recently b/c she thought ICD discharged.  Brother states he was told ICD did not discharge.  No orthopnea, PND.    Labs (12/13):  TSH 2.299, ALT 9  Labs (1/14):    K 4.7, creatinine 0.76   Wt Readings from Last 3 Encounters:  11/05/12 116 lb 12.8 oz (52.98 kg)    09/26/12 119 lb (53.978 kg)  09/19/12 125 lb 3.2 oz (56.79 kg)     Past Medical History  Diagnosis Date  . Seizures   . Hypertension   . Stroke     2007 - R sided weakness  . Asthma   . Diabetes mellitus   . 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 Thyroid disease   . Chronic systolic CHF (congestive heart failure)     Per brother, EF 17% s/p AICD 2007 (St. Jude). No hx CAD.  Marland Kitchen Dysrhythmia     atrial fibrillation-per son  . Coagulopathy     "Protein C deficiency"  . History of DVT (deep vein thrombosis)     s/p IVC filter  . Noncompliance     Current Outpatient Prescriptions  Medication Sig Dispense Refill  . albuterol (PROVENTIL) (2.5 MG/3ML) 0.083% nebulizer solution Take 2.5 mg by nebulization every 6 (six) hours as needed. For cough or wheeze      . ALPRAZolam (XANAX) 0.5 MG tablet Take 0.5 mg by mouth at bedtime as needed. For sleep      . amiodarone (PACERONE) 200 MG tablet Take 200 mg by mouth daily.      Marland Kitchen aspirin 81 MG EC tablet Take 1 tablet (81 mg total) by mouth daily. Swallow whole.  30 tablet  12  . digoxin (LANOXIN) 0.125 MG tablet Take 1 tablet (0.125 mg total) by mouth daily.  30 tablet  0  . escitalopram (LEXAPRO) 10 MG tablet Take 10 mg by mouth daily.      . feeding supplement (ENSURE COMPLETE) LIQD Take 237 mLs by mouth 2 (two) times daily between meals.      . furosemide (LASIX) 20 MG tablet Take 1 tablet (20 mg total) by mouth as needed (For sudden weight gain, swelling or shortness of breath).  30 tablet  3  . glipiZIDE (GLUCOTROL XL) 5 MG 24 hr tablet Take 1 tablet (5 mg total) by mouth daily.  30 tablet  1  . levETIRAcetam (KEPPRA) 500 MG tablet Take 1,000 mg by mouth 2 (two) times daily.      Marland Kitchen levothyroxine (SYNTHROID, LEVOTHROID) 50 MCG tablet Take 50 mcg by mouth daily.      Marland Kitchen lisinopril (PRINIVIL,ZESTRIL) 5 MG tablet Take 5 mg by mouth daily.      . metoprolol tartrate (LOPRESSOR) 25 MG tablet Take 3 tablets (75 mg total) by mouth 2 (two)  times daily.  180 tablet  0  . omeprazole (PRILOSEC) 20 MG capsule Take 20 mg by mouth 2 (two) times daily.      Marland Kitchen oxyCODONE-acetaminophen (PERCOCET) 10-325 MG per tablet Take 1 tablet by mouth 3 (three) times daily. Scheduled per pharmacy      . QUEtiapine (SEROQUEL XR) 50 MG TB24 Take 50 mg by mouth daily.      . Rivaroxaban (XARELTO) 20 MG TABS Take 1 tablet (20 mg total) by mouth daily with supper.  30 tablet  0  . spironolactone (ALDACTONE) 25 MG tablet Take 1 tablet (25 mg total) by mouth daily.  30 tablet  0   No current facility-administered medications for this visit.    Allergies:    Allergies  Allergen Reactions  . Penicillins Rash  . Sulfa Antibiotics Rash    Social History:  The patient  reports that she has quit smoking. She does not have any smokeless tobacco history on file. She reports that she does not drink alcohol or use illicit drugs.   ROS:  Please see the history of present illness.   ROS difficult to obtain due to expressive aphasia.   PHYSICAL EXAM: VS:  BP 128/86  Pulse 70  Ht 5\' 2"  (1.575 m)  Wt 116 lb 12.8 oz (52.98 kg)  BMI 21.36 kg/m2 Well nourished, well developed, in no acute distress  HEENT: normal  Neck: no JVD at 90  Cardiac: normal S1, S2; RRR; no murmur  Lungs: clear to auscultation bilaterally, no wheezing, rhonchi or rales  Abd: soft, nontender, no hepatomegaly  Ext: no edema  Skin: warm and dry  Neuro: Right-sided weakness noted  EKG:  NSR, HR 70, RAD, IVCD, no change from prior tracing.     ASSESSMENT AND PLAN:  1. Chronic Systolic CHF: On exam, her volume appears stable. She seems to be describing increased dyspnea. I will try to obtain a basic metabolic panel, BNP today. Of note, it has been difficult to draw blood from her in the past. 2. Ventricular Tachycardia:  She will see Dr. Graciela Husbands next month for follow up on ICD. Check a basic metabolic panel and magnesium today. She remains on amiodarone. I will also check TSH and  LFTs. 3. Atrial Fibrillation:  She is on Xarelto for treatment of her hypercoagulable state. She is maintaining sinus rhythm. She remains on amiodarone. Amiodarone labs will be checked as noted. 4. Hypercoagulable state. She is status post IVC filter and remains on Xarelto. 5. Headaches:  I will obtain a copy of her recent Head CT and I will arrange follow up with her neurologist in [facility]. 6. Disposition: She will follow up with Dr. Graciela Husbands next month. I will establish her with Dr. Antoine Poche in 3 months.  Signed, Tereso Newcomer, PA-C  3:11 PM 11/05/2012

## 2012-11-05 NOTE — Patient Instructions (Addendum)
LABS TODAY; BMET, BNP, MAG LEVEL  Your physician recommends that you schedule a NEW PT appointment in: 3 MONTHS WITH DR. HOCHREIN  KEEP YOUR DR. KLEIN APPT 11/2012   YOU HAVE A FOLLOW UP WITH [facility] WITH DR. FORD @ 9:30 AM; YOU NEED TO MAKE SURE YOU KEEP YOUR APPT.

## 2012-11-06 ENCOUNTER — Telehealth: Payer: Self-pay | Admitting: Physician Assistant

## 2012-11-06 ENCOUNTER — Observation Stay (HOSPITAL_COMMUNITY)
Admission: EM | Admit: 2012-11-06 | Discharge: 2012-11-08 | Disposition: A | Discharge status: Home / Self Care | Payer: Medicare Other | Attending: Internal Medicine | Admitting: Internal Medicine

## 2012-11-06 ENCOUNTER — Emergency Department (HOSPITAL_COMMUNITY): Payer: Medicare Other

## 2012-11-06 ENCOUNTER — Encounter (HOSPITAL_COMMUNITY): Payer: Self-pay | Admitting: Emergency Medicine

## 2012-11-06 DIAGNOSIS — Z95810 Presence of automatic (implantable) cardiac defibrillator: Secondary | ICD-10-CM | POA: Insufficient documentation

## 2012-11-06 DIAGNOSIS — R29898 Other symptoms and signs involving the musculoskeletal system: Secondary | ICD-10-CM | POA: Insufficient documentation

## 2012-11-06 DIAGNOSIS — Z86718 Personal history of other venous thrombosis and embolism: Secondary | ICD-10-CM | POA: Insufficient documentation

## 2012-11-06 DIAGNOSIS — Z7901 Long term (current) use of anticoagulants: Secondary | ICD-10-CM | POA: Insufficient documentation

## 2012-11-06 DIAGNOSIS — I69920 Aphasia following unspecified cerebrovascular disease: Secondary | ICD-10-CM | POA: Insufficient documentation

## 2012-11-06 DIAGNOSIS — I509 Heart failure, unspecified: Secondary | ICD-10-CM | POA: Insufficient documentation

## 2012-11-06 DIAGNOSIS — R079 Chest pain, unspecified: Principal | ICD-10-CM | POA: Insufficient documentation

## 2012-11-06 DIAGNOSIS — I5022 Chronic systolic (congestive) heart failure: Secondary | ICD-10-CM | POA: Insufficient documentation

## 2012-11-06 DIAGNOSIS — G40909 Epilepsy, unspecified, not intractable, without status epilepticus: Secondary | ICD-10-CM | POA: Insufficient documentation

## 2012-11-06 DIAGNOSIS — I4891 Unspecified atrial fibrillation: Secondary | ICD-10-CM | POA: Insufficient documentation

## 2012-11-06 DIAGNOSIS — I69998 Other sequelae following unspecified cerebrovascular disease: Secondary | ICD-10-CM | POA: Insufficient documentation

## 2012-11-06 DIAGNOSIS — E119 Type 2 diabetes mellitus without complications: Secondary | ICD-10-CM | POA: Insufficient documentation

## 2012-11-06 DIAGNOSIS — I10 Essential (primary) hypertension: Secondary | ICD-10-CM | POA: Insufficient documentation

## 2012-11-06 HISTORY — DX: Other primary thrombophilia: D68.59

## 2012-11-06 HISTORY — DX: Ventricular tachycardia: I47.2

## 2012-11-06 HISTORY — DX: Unspecified atrial fibrillation: I48.91

## 2012-11-06 HISTORY — DX: Hypothyroidism, unspecified: E03.9

## 2012-11-06 HISTORY — DX: Ventricular tachycardia, unspecified: I47.20

## 2012-11-06 LAB — CBC WITH DIFFERENTIAL/PLATELET
Basophils Absolute: 0 10*3/uL (ref 0.0–0.1)
Basophils Relative: 0 % (ref 0–1)
Eosinophils Absolute: 0 10*3/uL (ref 0.0–0.7)
Eosinophils Relative: 0 % (ref 0–5)
Lymphs Abs: 1.1 10*3/uL (ref 0.7–4.0)
MCH: 27.1 pg (ref 26.0–34.0)
MCHC: 30.7 g/dL (ref 30.0–36.0)
Neutrophils Relative %: 69 % (ref 43–77)
Platelets: 255 10*3/uL (ref 150–400)
RBC: 3.8 MIL/uL — ABNORMAL LOW (ref 3.87–5.11)
RDW: 17.3 % — ABNORMAL HIGH (ref 11.5–15.5)

## 2012-11-06 LAB — BASIC METABOLIC PANEL
BUN: 16 mg/dL (ref 6–23)
Calcium: 9.3 mg/dL (ref 8.4–10.5)
Calcium: 9.4 mg/dL (ref 8.4–10.5)
Creatinine, Ser: 1 mg/dL (ref 0.4–1.2)
GFR calc Af Amer: >90 mL/min (ref >90)
GFR calc non Af Amer: >90 mL/min (ref >90)
GFR: 77.76 mL/min (ref >60.00)
Glucose, Bld: 96 mg/dL (ref 70–99)
Potassium: 3.6 mEq/L (ref 3.5–5.1)
Sodium: 142 mEq/L (ref 135–145)

## 2012-11-06 LAB — HEPATIC FUNCTION PANEL
ALT: 10 U/L (ref 0–35)
Alkaline Phosphatase: 63 U/L (ref 39–117)
Bilirubin, Direct: 0.6 mg/dL — ABNORMAL HIGH (ref 0.0–0.3)
Indirect Bilirubin: 0.7 mg/dL (ref 0.3–0.9)
Total Bilirubin: 1.3 mg/dL — ABNORMAL HIGH (ref 0.3–1.2)

## 2012-11-06 LAB — PRO B NATRIURETIC PEPTIDE: Pro B Natriuretic peptide (BNP): 12374 pg/mL — ABNORMAL HIGH (ref 0–125)

## 2012-11-06 LAB — GLUCOSE, CAPILLARY
Glucose-Capillary: 137 mg/dL — ABNORMAL HIGH (ref 70–99)
Glucose-Capillary: 95 mg/dL (ref 70–99)

## 2012-11-06 LAB — POCT I-STAT TROPONIN I: Troponin i, poc: 0.12 ng/mL (ref 0.00–0.08)

## 2012-11-06 LAB — MAGNESIUM: Magnesium: 1.7 mg/dL (ref 1.5–2.5)

## 2012-11-06 LAB — BRAIN NATRIURETIC PEPTIDE: Pro B Natriuretic peptide (BNP): 1906 pg/mL — ABNORMAL HIGH (ref 0.0–100.0)

## 2012-11-06 MED ORDER — SODIUM CHLORIDE 0.9 % IV SOLN
INTRAVENOUS | Status: DC
  Administered 2012-11-06: 12:00:00 via INTRAVENOUS

## 2012-11-06 MED ORDER — LEVOTHYROXINE SODIUM 50 MCG PO TABS
50.0000 ug | ORAL_TABLET | Freq: Every day | ORAL | Status: DC
  Administered 2012-11-07 – 2012-11-08 (×2): 50 ug via ORAL
  Filled 2012-11-06 (×5): qty 1

## 2012-11-06 MED ORDER — QUETIAPINE FUMARATE ER 50 MG PO TB24
50.0000 mg | ORAL_TABLET | Freq: Every day | ORAL | Status: DC
  Administered 2012-11-06 – 2012-11-08 (×3): 50 mg via ORAL
  Filled 2012-11-06 (×4): qty 1

## 2012-11-06 MED ORDER — ONDANSETRON HCL 4 MG/2ML IJ SOLN
4.0000 mg | Freq: Once | INTRAMUSCULAR | Status: AC
  Administered 2012-11-06: 4 mg via INTRAVENOUS
  Filled 2012-11-06: qty 2

## 2012-11-06 MED ORDER — METOPROLOL TARTRATE 50 MG PO TABS
75.0000 mg | ORAL_TABLET | Freq: Two times a day (BID) | ORAL | Status: DC
  Administered 2012-11-07 – 2012-11-08 (×4): 75 mg via ORAL
  Filled 2012-11-06 (×9): qty 1

## 2012-11-06 MED ORDER — FUROSEMIDE 20 MG PO TABS
20.0000 mg | ORAL_TABLET | ORAL | Status: DC | PRN
  Filled 2012-11-06: qty 1

## 2012-11-06 MED ORDER — LEVALBUTEROL HCL 0.63 MG/3ML IN NEBU
0.6300 mg | INHALATION_SOLUTION | Freq: Four times a day (QID) | RESPIRATORY_TRACT | Status: DC | PRN
  Filled 2012-11-06: qty 3

## 2012-11-06 MED ORDER — GLIPIZIDE ER 5 MG PO TB24
5.0000 mg | ORAL_TABLET | Freq: Every day | ORAL | Status: DC
  Administered 2012-11-07 – 2012-11-08 (×2): 5 mg via ORAL
  Filled 2012-11-06 (×4): qty 1

## 2012-11-06 MED ORDER — ASPIRIN EC 81 MG PO TBEC
81.0000 mg | DELAYED_RELEASE_TABLET | Freq: Every day | ORAL | Status: DC
  Administered 2012-11-07 – 2012-11-08 (×2): 81 mg via ORAL
  Filled 2012-11-06 (×3): qty 1

## 2012-11-06 MED ORDER — SODIUM CHLORIDE 0.9 % IJ SOLN: 3.0000 mL | INTRAMUSCULAR | Status: DC | PRN

## 2012-11-06 MED ORDER — ACETAMINOPHEN 650 MG RE SUPP: 650.0000 mg | Freq: Four times a day (QID) | RECTAL | Status: DC | PRN

## 2012-11-06 MED ORDER — ASPIRIN 81 MG PO CHEW
324.0000 mg | CHEWABLE_TABLET | Freq: Once | ORAL | Status: AC
  Administered 2012-11-06: 324 mg via ORAL
  Filled 2012-11-06: qty 1
  Filled 2012-11-06: qty 3

## 2012-11-06 MED ORDER — ONDANSETRON HCL 4 MG/2ML IJ SOLN: 4.0000 mg | Freq: Four times a day (QID) | INTRAMUSCULAR | Status: DC | PRN

## 2012-11-06 MED ORDER — MORPHINE SULFATE 4 MG/ML IJ SOLN
4.0000 mg | Freq: Once | INTRAMUSCULAR | Status: AC
  Administered 2012-11-06: 4 mg via INTRAVENOUS
  Filled 2012-11-06: qty 1

## 2012-11-06 MED ORDER — AMIODARONE HCL 200 MG PO TABS
200.0000 mg | ORAL_TABLET | Freq: Every day | ORAL | Status: DC
  Administered 2012-11-06: 200 mg via ORAL
  Filled 2012-11-06 (×2): qty 1

## 2012-11-06 MED ORDER — LEVETIRACETAM 500 MG PO TABS
1000.0000 mg | ORAL_TABLET | Freq: Two times a day (BID) | ORAL | Status: DC
  Administered 2012-11-07 – 2012-11-08 (×4): 1000 mg via ORAL
  Filled 2012-11-06 (×7): qty 2

## 2012-11-06 MED ORDER — ACETAMINOPHEN 325 MG PO TABS
650.0000 mg | ORAL_TABLET | Freq: Four times a day (QID) | ORAL | Status: DC | PRN
  Filled 2012-11-06: qty 2

## 2012-11-06 MED ORDER — SODIUM CHLORIDE 0.9 % IJ SOLN
3.0000 mL | Freq: Two times a day (BID) | INTRAMUSCULAR | Status: DC
  Administered 2012-11-06 – 2012-11-08 (×3): 3 mL via INTRAVENOUS

## 2012-11-06 MED ORDER — OXYCODONE HCL 5 MG PO TABS
5.0000 mg | ORAL_TABLET | ORAL | Status: DC | PRN
  Administered 2012-11-06 – 2012-11-08 (×5): 5 mg via ORAL
  Filled 2012-11-06 (×7): qty 1

## 2012-11-06 MED ORDER — SODIUM CHLORIDE 0.9 % IJ SOLN
3.0000 mL | Freq: Two times a day (BID) | INTRAMUSCULAR | Status: DC
  Administered 2012-11-06 – 2012-11-08 (×5): 3 mL via INTRAVENOUS

## 2012-11-06 MED ORDER — ONDANSETRON HCL 4 MG/2ML IJ SOLN: 4.0000 mg | Freq: Three times a day (TID) | INTRAMUSCULAR | Status: AC | PRN

## 2012-11-06 MED ORDER — ALBUTEROL SULFATE (5 MG/ML) 0.5% IN NEBU: 2.5000 mg | INHALATION_SOLUTION | RESPIRATORY_TRACT | Status: DC | PRN

## 2012-11-06 MED ORDER — ALPRAZOLAM 0.5 MG PO TABS
0.5000 mg | ORAL_TABLET | Freq: Every evening | ORAL | Status: DC | PRN
  Administered 2012-11-06 – 2012-11-07 (×2): 0.5 mg via ORAL
  Filled 2012-11-06 (×2): qty 1

## 2012-11-06 MED ORDER — SODIUM CHLORIDE 0.9 % IV SOLN: 250.0000 mL | INTRAVENOUS | Status: DC | PRN

## 2012-11-06 MED ORDER — SPIRONOLACTONE 25 MG PO TABS
25.0000 mg | ORAL_TABLET | Freq: Every day | ORAL | Status: DC
  Administered 2012-11-07 – 2012-11-08 (×2): 25 mg via ORAL
  Filled 2012-11-06 (×3): qty 1

## 2012-11-06 MED ORDER — DIGOXIN 125 MCG PO TABS
0.1250 mg | ORAL_TABLET | Freq: Every day | ORAL | Status: DC
  Filled 2012-11-06: qty 1

## 2012-11-06 MED ORDER — ACETAMINOPHEN 325 MG PO TABS
650.0000 mg | ORAL_TABLET | Freq: Once | ORAL | Status: AC
  Administered 2012-11-06: 650 mg via ORAL
  Filled 2012-11-06: qty 2

## 2012-11-06 MED ORDER — INSULIN ASPART 100 UNIT/ML ~~LOC~~ SOLN
0.0000 [IU] | Freq: Three times a day (TID) | SUBCUTANEOUS | Status: DC
  Administered 2012-11-06: 2 [IU] via SUBCUTANEOUS
  Administered 2012-11-07: 1 [IU] via SUBCUTANEOUS
  Administered 2012-11-08: 2 [IU] via SUBCUTANEOUS

## 2012-11-06 MED ORDER — ONDANSETRON HCL 4 MG PO TABS: 4.0000 mg | ORAL_TABLET | Freq: Four times a day (QID) | ORAL | Status: DC | PRN

## 2012-11-06 MED ORDER — RIVAROXABAN 20 MG PO TABS
20.0000 mg | ORAL_TABLET | Freq: Every day | ORAL | Status: DC
  Administered 2012-11-06 – 2012-11-07 (×2): 20 mg via ORAL
  Filled 2012-11-06 (×4): qty 1

## 2012-11-06 MED ORDER — ESCITALOPRAM OXALATE 10 MG PO TABS
10.0000 mg | ORAL_TABLET | Freq: Every day | ORAL | Status: DC
  Administered 2012-11-07 – 2012-11-08 (×2): 10 mg via ORAL
  Filled 2012-11-06 (×3): qty 1

## 2012-11-06 MED ORDER — LISINOPRIL 5 MG PO TABS
5.0000 mg | ORAL_TABLET | Freq: Every day | ORAL | Status: DC
  Administered 2012-11-07 – 2012-11-08 (×2): 5 mg via ORAL
  Filled 2012-11-06 (×3): qty 1

## 2012-11-06 MED ORDER — ASPIRIN 81 MG PO TBEC: 81.0000 mg | DELAYED_RELEASE_TABLET | Freq: Every day | ORAL | Status: DC

## 2012-11-06 MED ORDER — ENSURE COMPLETE PO LIQD
237.0000 mL | Freq: Two times a day (BID) | ORAL | Status: DC
  Administered 2012-11-06 – 2012-11-08 (×3): 237 mL via ORAL

## 2012-11-06 NOTE — ED Provider Notes (Signed)
History     CSN: [id number]  Arrival date & time 11/06/12  4098   First MD Initiated Contact with Patient 11/06/12 [phone]      Chief Complaint  Patient presents with  . Chest Pain    patient claims defibrillator went off prior to EMS     (Consider location/radiation/quality/duration/timing/severity/associated sxs/prior treatment) HPI  57 year old female with history of chronic systolic CHF with a St. Jude AICD, afib, stroke presents for evaluation of CP.   History was obtained through EMS note. EMS report the patient complains that her defibrillator went off causing chest pain.  Pt indicates to me that she has constant cp x 5 weeks.  However, Patient is an extremely poor historian and i was unable to obtain any other pertinent info.  Patient answer most questions with "i don't know".  Past Medical History  Diagnosis Date  . Seizures   . Hypertension   . Stroke     2007 - R sided weakness  . Asthma   . Diabetes mellitus   . 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 Thyroid disease   . Chronic systolic CHF (congestive heart failure)     Per brother, EF 17% s/p AICD 2007 (St. Jude). No hx CAD.  Marland Kitchen Dysrhythmia     atrial fibrillation-per son  . Coagulopathy     "Protein C deficiency"  . History of DVT (deep vein thrombosis)     s/p IVC filter  . Noncompliance     Past Surgical History  Procedure Laterality Date  . Cholecystectomy    . Appendectomy    . Pacemaker insertion    . Vena cava filter placement  07/19/2012    Procedure: INSERTION VENA-CAVA FILTER;  Surgeon: Larina Earthly, MD;  Location: [facility] OR;  Service: Vascular;  Laterality: [name];  . Insert / replace / remove pacemaker      ICD  . Abdominal hysterectomy      Family History  Problem Relation Age of Onset  . Coronary artery disease Neg Hx     History  Substance Use Topics  . Smoking status: Former Games developer  . Smokeless tobacco: Not on file  . Alcohol Use: No    OB History   Grav Para Term Preterm  Abortions TAB SAB Ect Mult Living                  Review of Systems Level 5 caveat for poor historian  Allergies  Penicillins and Sulfa antibiotics  Home Medications   Current Outpatient Rx  Name  Route  Sig  Dispense  Refill  . albuterol (PROVENTIL) (2.5 MG/3ML) 0.083% nebulizer solution   Nebulization   Take 2.5 mg by nebulization every 6 (six) hours as needed. For cough or wheeze         . ALPRAZolam (XANAX) 0.5 MG tablet   Oral   Take 0.5 mg by mouth at bedtime as needed. For sleep         . amiodarone (PACERONE) 200 MG tablet   Oral   Take 200 mg by mouth daily.         Marland Kitchen aspirin 81 MG EC tablet   Oral   Take 1 tablet (81 mg total) by mouth daily. Swallow whole.   30 tablet   12   . digoxin (LANOXIN) 0.125 MG tablet   Oral   Take 1 tablet (0.125 mg total) by mouth daily.   30 tablet   0   .  escitalopram (LEXAPRO) 10 MG tablet   Oral   Take 10 mg by mouth daily.         . feeding supplement (ENSURE COMPLETE) LIQD   Oral   Take 237 mLs by mouth 2 (two) times daily between meals.         . furosemide (LASIX) 20 MG tablet   Oral   Take 1 tablet (20 mg total) by mouth as needed (For sudden weight gain, swelling or shortness of breath).   30 tablet   3   . glipiZIDE (GLUCOTROL XL) 5 MG 24 hr tablet   Oral   Take 1 tablet (5 mg total) by mouth daily.   30 tablet   1   . levETIRAcetam (KEPPRA) 500 MG tablet   Oral   Take 1,000 mg by mouth 2 (two) times daily.         Marland Kitchen levothyroxine (SYNTHROID, LEVOTHROID) 50 MCG tablet   Oral   Take 50 mcg by mouth daily.         Marland Kitchen lisinopril (PRINIVIL,ZESTRIL) 5 MG tablet   Oral   Take 5 mg by mouth daily.         . metoprolol tartrate (LOPRESSOR) 25 MG tablet   Oral   Take 3 tablets (75 mg total) by mouth 2 (two) times daily.   180 tablet   0   . omeprazole (PRILOSEC) 20 MG capsule   Oral   Take 20 mg by mouth 2 (two) times daily.         Marland Kitchen oxyCODONE-acetaminophen (PERCOCET) 10-325 MG  per tablet   Oral   Take 1 tablet by mouth 3 (three) times daily. Scheduled per pharmacy         . QUEtiapine (SEROQUEL XR) 50 MG TB24   Oral   Take 50 mg by mouth daily.         . Rivaroxaban (XARELTO) 20 MG TABS   Oral   Take 1 tablet (20 mg total) by mouth daily with supper.   30 tablet   0   . spironolactone (ALDACTONE) 25 MG tablet   Oral   Take 1 tablet (25 mg total) by mouth daily.   30 tablet   0     There were no vitals taken for this visit.  Physical Exam  Nursing note and vitals reviewed. Constitutional: No distress.  Chronically ill-appearing African American female  HENT:  Head: Atraumatic.  Mouth/Throat: Oropharynx is clear and moist.  Eyes: Conjunctivae and EOM are normal. Pupils are equal, round, and reactive to light.  Neck: Neck supple. No JVD present.  No nuchal rigidity  Cardiovascular: Normal rate and regular rhythm.  Exam reveals no gallop and no friction rub.   No murmur heard. Pulmonary/Chest: Effort normal. She has rales (rales heard at R lung base.  No wheeze or rhonchi noted). She exhibits tenderness (tenderness overlying AICD on L upper chest, no overlying skin changes).  Abdominal: Soft. There is no tenderness.  Musculoskeletal: She exhibits edema (trace pitting edema to BLE, pedal pulses palpable.  ).  Neurological:  R side paresthesia with R arm contracture.  No facial droop  Skin: Skin is warm. No rash noted.  Psychiatric: She has a normal mood and affect.    ED Course  Procedures (including critical care time)   Date: 11/06/2012  Rate: 76  Rhythm: normal sinus rhythm  QRS Axis: normal  Intervals: PR prolonged  ST/T Wave abnormalities: nonspecific ST/T changes  Conduction Disutrbances:first-degree A-V block  and nonspecific intraventricular conduction delay  Narrative Interpretation:   Old EKG Reviewed: unchanged  Results for orders placed during the hospital encounter of 11/06/12  CBC WITH DIFFERENTIAL      Result  Value Range   WBC 5.0  4.0 - 10.5 K/uL   RBC 3.80 (*) 3.87 - 5.11 MIL/uL   Hemoglobin 10.3 (*) 12.0 - 15.0 g/dL   HCT 29.5 (*) 62.1 - 30.8 %   MCV 88.2  78.0 - 100.0 fL   MCH 27.1  26.0 - 34.0 pg   MCHC 30.7  30.0 - 36.0 g/dL   RDW 65.7 (*) 84.6 - 96.2 %   Platelets 255  150 - 400 K/uL   Neutrophils Relative 69  43 - 77 %   Neutro Abs 3.4  1.7 - 7.7 K/uL   Lymphocytes Relative 22  12 - 46 %   Lymphs Abs 1.1  0.7 - 4.0 K/uL   Monocytes Relative 9  3 - 12 %   Monocytes Absolute 0.4  0.1 - 1.0 K/uL   Eosinophils Relative 0  0 - 5 %   Eosinophils Absolute 0.0  0.0 - 0.7 K/uL   Basophils Relative 0  0 - 1 %   Basophils Absolute 0.0  0.0 - 0.1 K/uL  BASIC METABOLIC PANEL      Result Value Range   Sodium 142  135 - 145 mEq/L   Potassium 3.6  3.5 - 5.1 mEq/L   Chloride 104  96 - 112 mEq/L   CO2 25  19 - 32 mEq/L   Glucose, Bld 123 (*) 70 - 99 mg/dL   BUN 15  6 - 23 mg/dL   Creatinine, Ser 9.52  0.50 - 1.10 mg/dL   Calcium 9.4  8.4 - 84.1 mg/dL   GFR calc non Af Amer >90  >90 mL/min   GFR calc Af Amer >90  >90 mL/min  PRO B NATRIURETIC PEPTIDE      Result Value Range   Pro B Natriuretic peptide (BNP) 12374.0 (*) 0 - 125 pg/mL  POCT I-STAT TROPONIN I      Result Value Range   Troponin i, poc 0.12 (*) 0.00 - 0.08 ng/mL   Comment NOTIFIED PHYSICIAN     Comment 3            Dg Chest 2 View  11/06/2012  *RADIOLOGY REPORT*  Clinical Data: Chest pain, history of diabetes  CHEST - 2 VIEW  Comparison: Chest x-ray of 09/14/2012  Findings: Moderate cardiomegaly is stable.  There does appear to be pulmonary vascular congestion present with small effusions, suggesting mild congestive heart failure.  An AICD lead is noted. The bones are osteopenic.  IMPRESSION: Probable mild CHF with small effusions and pulmonary vascular congestion.   Original Report Authenticated By: Dwyane Dee, M.D.    Ct Head Wo Contrast  11/06/2012  *RADIOLOGY REPORT*  Clinical Data: headache, chest pain.  CT HEAD WITHOUT  CONTRAST  Technique:  Contiguous axial images were obtained from the base of the skull through the vertex without contrast.  Comparison: 08/27/2012  Findings: Large old left MCA infarct with encephalomalacia, stable. No acute infarction or hemorrhage.  No hydrocephalus.  No acute calvarial abnormality. Visualized paranasal sinuses and mastoids clear.  Orbital soft tissues unremarkable.  IMPRESSION: Old large left MCA infarct.  No acute intracranial abnormality.   Original Report Authenticated By: Charlett Nose, M.D.        8:20 AM Patient was seen and evaluated by me for chest  pain. History was limited due to patient being a poor historian.  She complains of her defib misfiring and also having active CP.  Unsure if misfiring causing CP, or vice versa.  Pt appears to be sinus on monitor.  EMS did not notice any misfiring.  Plan to perform cardiac work up.  Will interrogate her St. Jude AICD.    8:35 AM Due to patient's inability to give a reliable history, I was able to contact the family member at home. I spoke with her brother, who states patient has been complaining of headache, and intermittent chest pain for the past several weeks. She is scheduled to followup with a neurologist after seeing her cardiologist yesterday for her complaint. However she continued to complain of headache and family member was concerned. I will obtain a head CT scan at this time and will continue with current workup.  8:51 AM Pt has elevated troponin I of 0.12, but has elevated troponin in the past, no hx of CAD.  Care discussed with attending.  ASA given.  Since pt c/o of headache, will not give SL nitro at this time, morphine and O2 supplementation given.    9:28 AM She has an elevated pro BNP of 12,000, however lower than her normal baseline. Chest x-ray shows minimal effusion and pulmonary vascular congestion. She is in no acute respiratory distress. Head CT reviewed by me shows an old left MCA infarct without any  acute intracranial abnormalities. Her EKG shows sinus rhythm without any evidence of ischemic changes.  Our nurse has interrogate the AICD which shows no evidence of misfiring. I have consult with Trish from [facility] cardiology who agrees to have cardiologist to see patient in the ER for further management.  CRITICAL CARE Performed by: Fayrene Helper   Total critical care time: 30 min  Critical care time was exclusive of separately billable procedures and treating other patients.  Critical care was necessary to treat or prevent imminent or life-threatening deterioration.  Critical care was time spent personally by me on the following activities: development of treatment plan with patient and/or surrogate as well as nursing, discussions with consultants, evaluation of patient's response to treatment, examination of patient, obtaining history from patient or surrogate, ordering and performing treatments and interventions, ordering and review of laboratory studies, ordering and review of radiographic studies, pulse oximetry and re-evaluation of patient's condition.  11:37 AM [place] cardiology agrees to see pt in ER for consult but request medical admission.  Will consult hospitalist for admission.    11:51 AM I have consulted with [facility], Dr. Susie Cassette who agrees to admit pt to tele, team 2, under her care.    BP 151/91  Pulse 75  Temp(Src) 98 F (36.7 C) (Oral)  Resp 19  SpO2 97%  I have reviewed nursing notes and vital signs. I personally reviewed the imaging tests through PACS system  I reviewed available ER/hospitalization records thought the EMR  1. Headache 2. Chest pain  MDM          Fayrene Helper, PA-C 11/06/12 1152

## 2012-11-06 NOTE — ED Notes (Signed)
Melissa, RN at bedside to interrogate defibrillator

## 2012-11-06 NOTE — ED Provider Notes (Signed)
Medical screening examination/treatment/procedure(s) were conducted as a shared visit with non-physician practitioner(s) and myself.  I personally evaluated the patient during the encounter  Patient presents with complaints of possible chest pain and headache. The history is very difficult because of her prior stroke. There are no family members here at the bedside with her.  PA Laveda Norman has contacted them to obtain additional history.  Patient has a history of dilated cardiomyopathy.  Her troponin is slightly elevated. According to her record she does not have known history of acute coronary syndrome but I cannot determine if she's had any investigation such as recent cardiac catheterization. I suspect the patient will require admission for serial evaluation. We'll consult with her cardiology service first.    Celene Kras, MD 11/06/12 [phone]

## 2012-11-06 NOTE — Consult Note (Signed)
CARDIOLOGY CONSULT NOTE  Patient ID: Elizabeth Love, MRN: [id number], DOB/AGE: 01-25-1956 57 y.o. Admit date: 11/06/2012   Date of Consult: 11/06/2012 Primary Physician: [name], [name], MD Primary Cardiologist: Hochrein  Chief Complaint: chest pain Reason for Consult: chest pain  HPI: Ms. Gross is a complex patient with a history of chronic systolic CHF, status post AICD implantation. She also has a hx of hypothyroidism, seizure disorder, HTN, DM2. Her prior care was provided in [facility] and her device was implanted around 2007 or 2008. She also had a stroke that has left her with residual right-sided weakness and expressive aphasia. She moved to [place] 2-3 years ago. She was initially evaluated by our team in 04/2012. She was transferred from the emergency room in [facility] with some mental status changes and abdominal pain. She was noted to be in AFib with RVR. Chest CT was negative for pulmonary embolism. She did have some evidence of volume overload. Echocardiogram 8/13: Mild LVH, EF 10%, severe LAE, mild RVE, moderate TR, PASP 39, small pericardial effusion. She has had significant problems with behavioral issues likely related to her stroke. She was not felt to be a long-term anticoagulation candidate at that time, butin 06/2012 was found to have a right lower extremity DVT, protein C deficiency (seen by heme), and placed on Xarelto. She was then admitted twice in 12/13. She was seen for abdominal pain/UTI, and was seen by cardiology because of VT & subsequent ICD shock x5 in the setting of hypokalemia - spironolactone was added. She was hospitalized in 08/2012 with a/c systolic CHF with positive enzymes felt related to strain. There were significant issues during her hospitalization trying to get her to take her medications. Her family noted that they often have to coax her to take her medications with rewards. There multiple discussions regarding long-term facility care but she  refused this. Per office note from yesterday, she was recently seen in the ER at [facility] for a) headache with negative CT for bleed and b) potential ICD discharge but the brother stated then they were told the ICD did not discharge.  She presented to [facility] today complaining of headache, chest pain and felt her defibrillator fire today. Interrogation did not reveal any shocks today but did show an episode of VF terminated appropriately by ICD on 10/26/12. History is extremely limited by her aphasia. At some times she is able to answer questions with simple words/phrases, but many other times she just says "I don't know." She sometimes will answer yes while shaking her head no. Headache is constant, "throbbing," chronic for many years, ?not worse today. Chest pain is sharp, constant, worse today. In the ER, CXR with probable mild CHF small effusions and pulm vasc congestion. Hgb 10.3 (c/w prior), POC troponin 0.12, pBNP 12374 (BNP was 1906 yesterday). 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   Past Medical History  Diagnosis Date  . Seizures   . Hypertension   . Stroke     2007 - R sided weakness  . Asthma   . Diabetes mellitus   . 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 Thyroid disease   . Chronic systolic CHF (congestive heart failure)     Per brother, EF 17% s/p AICD 2007 ([employer]). No hx CAD.  Marland Kitchen Dysrhythmia     atrial fibrillation-per son  . Coagulopathy     "Protein C deficiency"  . History of DVT (deep vein  thrombosis)     s/p IVC filter  . Noncompliance       Most Recent Cardiac Studies: 2D echo 04/2012 Study Conclusions - Left ventricle: The cavity size was severely dilated. Wall thickness was increased in a pattern of mild LVH. The estimated ejection fraction was 10%. Diffuse hypokinesis. - Mitral valve: Severe regurgitation. - Left atrium: The atrium was severely dilated. - Right ventricle: The cavity size was mildly dilated. - Right atrium: The atrium was mildly  dilated. - Tricuspid valve: Moderate regurgitation. - Pulmonary arteries: PA peak pressure: 39mm Hg (S). - Pericardium, extracardiac: A small, free-flowing pericardial effusion was identified. The fluid had no internal echoes.There was no evidence of hemodynamic compromise.   Surgical History:  Past Surgical History  Procedure Laterality Date  . Cholecystectomy    . Appendectomy    . Pacemaker insertion    . Vena cava filter placement  07/19/2012    Procedure: INSERTION VENA-CAVA FILTER;  Surgeon: Larina Earthly, MD;  Location: [facility] OR;  Service: Vascular;  Laterality: [name];  . Insert / replace / remove pacemaker      ICD  . Abdominal hysterectomy       Home Meds: Prior to Admission medications   Medication Sig Start Date End Date Taking? Authorizing Provider  albuterol (PROVENTIL) (2.5 MG/3ML) 0.083% nebulizer solution Take 2.5 mg by nebulization every 6 (six) hours as needed. For cough or wheeze    Historical Provider, MD  ALPRAZolam Prudy Feeler) 0.5 MG tablet Take 0.5 mg by mouth at bedtime as needed. For sleep    Historical Provider, MD  amiodarone (PACERONE) 200 MG tablet Take 200 mg by mouth daily.    Historical Provider, MD  aspirin 81 MG EC tablet Take 1 tablet (81 mg total) by mouth daily. Swallow whole. 09/19/12   Roger A Arguello, PA-C  digoxin (LANOXIN) 0.125 MG tablet Take 1 tablet (0.125 mg total) by mouth daily. 08/29/12   Penny Pia, MD  escitalopram (LEXAPRO) 10 MG tablet Take 10 mg by mouth daily.    Historical Provider, MD  feeding supplement (ENSURE COMPLETE) LIQD Take 237 mLs by mouth 2 (two) times daily between meals. 09/19/12   Roger A Arguello, PA-C  furosemide (LASIX) 20 MG tablet Take 1 tablet (20 mg total) by mouth as needed (For sudden weight gain, swelling or shortness of breath). 09/19/12   Roger A Arguello, PA-C  glipiZIDE (GLUCOTROL XL) 5 MG 24 hr tablet Take 1 tablet (5 mg total) by mouth daily. 07/22/12   Catarina Hartshorn, MD  levETIRAcetam (KEPPRA) 500 MG tablet Take 1,000  mg by mouth 2 (two) times daily.    Historical Provider, MD  levothyroxine (SYNTHROID, LEVOTHROID) 50 MCG tablet Take 50 mcg by mouth daily.    Historical Provider, MD  lisinopril (PRINIVIL,ZESTRIL) 5 MG tablet Take 5 mg by mouth daily.    Historical Provider, MD  metoprolol tartrate (LOPRESSOR) 25 MG tablet Take 3 tablets (75 mg total) by mouth 2 (two) times daily. 08/29/12   Penny Pia, MD  omeprazole (PRILOSEC) 20 MG capsule Take 20 mg by mouth 2 (two) times daily.    Historical Provider, MD  oxyCODONE-acetaminophen (PERCOCET) 10-325 MG per tablet Take 1 tablet by mouth 3 (three) times daily. Scheduled per pharmacy    Historical Provider, MD  QUEtiapine (SEROQUEL XR) 50 MG TB24 Take 50 mg by mouth daily.    Historical Provider, MD  Rivaroxaban (XARELTO) 20 MG TABS Take 1 tablet (20 mg total) by mouth daily with supper. 08/29/12  Penny Pia, MD  spironolactone (ALDACTONE) 25 MG tablet Take 1 tablet (25 mg total) by mouth daily. 08/29/12   Penny Pia, MD    Inpatient Medications:  .  morphine injection  4 mg Intravenous Once  . ondansetron  4 mg Intravenous Once    Allergies:  Allergies  Allergen Reactions  . Penicillins Rash  . Sulfa Antibiotics Rash    History   Social History  . Marital Status: Single    Spouse Name: [name]    Number of Children: [name]  . Years of Education: [name]   Occupational History  . Not on file.   Social History Main Topics  . Smoking status: Former Games developer  . Smokeless tobacco: Not on file  . Alcohol Use: No  . Drug Use: No  . Sexually Active:    Other Topics Concern  . Not on file   Social History Narrative  . No narrative on file     Family History  Problem Relation Age of Onset  . Coronary artery disease Neg Hx      Review of Systems: see above. Limited by mental status.  Labs:  Troponin 0.12  pBNP 12374 (was 1906 yesterday)  Lab Results  Component Value Date   WBC 5.0 11/06/2012   HGB 10.3* 11/06/2012   HCT 33.5* 11/06/2012    MCV 88.2 11/06/2012   PLT 255 11/06/2012    Recent Labs Lab 11/06/12 0816  NA 142  K 3.6  CL 104  CO2 25  BUN 15  CREATININE 0.78  CALCIUM 9.4  GLUCOSE 123*   Radiology/Studies:  Dg Chest 2 View 11/06/2012  *RADIOLOGY REPORT*  Clinical Data: Chest pain, history of diabetes  CHEST - 2 VIEW  Comparison: Chest x-ray of 09/14/2012  Findings: Moderate cardiomegaly is stable.  There does appear to be pulmonary vascular congestion present with small effusions, suggesting mild congestive heart failure.  An AICD lead is noted. The bones are osteopenic.  IMPRESSION: Probable mild CHF with small effusions and pulmonary vascular congestion.   Original Report Authenticated By: Dwyane Dee, M.D.    Ct Head Wo Contrast 11/06/2012  *RADIOLOGY REPORT*  Clinical Data: headache, chest pain.  CT HEAD WITHOUT CONTRAST  Technique:  Contiguous axial images were obtained from the base of the skull through the vertex without contrast.  Comparison: 08/27/2012  Findings: Large old left MCA infarct with encephalomalacia, stable. No acute infarction or hemorrhage.  No hydrocephalus.  No acute calvarial abnormality. Visualized paranasal sinuses and mastoids clear.  Orbital soft tissues unremarkable.  IMPRESSION: Old large left MCA infarct.  No acute intracranial abnormality.   Original Report Authenticated By: Charlett Nose, M.D.    EKG: NSR 76bpm 1st degree AVB, NSIVCD, LVH with repol abnl TWI V5-V6 (TWI in V6 is new from 09/2012 tracing)  Physical Exam: Blood pressure 148/84, pulse 77, temperature 98 F (36.7 C), temperature source Oral, resp. rate 22, SpO2 96.00%. General: Well developed chronically ill AAF in no acute distress. Head: Normocephalic, atraumatic, sclera non-icteric, no xanthomas, nares are without discharge.  Neck: Negative for carotid bruits.  Lungs: Coarse anteriorly without obvious wheezes, rales or rhonchi. Breathing is unlabored. Heart: RRR with S1 S2. No murmurs, rubs, or gallops  appreciated. Abdomen: Soft, non-tender, non-distended with normoactive bowel sounds. No hepatomegaly. No rebound/guarding. No obvious abdominal masses. Msk:  Did not cooperate with MSK exam on R side, but L strength in tact. Extremities: No clubbing or cyanosis. No edema.  Distal pedal pulses are 2+ and equal bilaterally.  Neuro: Alert and oriented to self, not place/situation/date. No facial asymmetry. Slow to speak, expressive aphasia noted. R sided weaknes. Psych:  Flat affect.   Assessment and Plan:   1. Headache 2. Chest pain, with borderline POC troponin (no known CAD by record) 3. Acute on chronic systolic CHF 4. ICD discharge - did not fire today, but did have appropriate shock 10/26/12 for VF (has h/o VT) 5. CVA with residual significant aphasia and R sided weakness 6. Anemia 7. H/o PAF 8. RLE DVT 06/2012 with protein C deficiency and IVC filter, on Xarelto 9. H/o noncompliance - family has had to coax her into meds previously per note 10. HTN 11. H/o seizure disorder  Signed, Ronie Spies PA-C 11/06/2012, 10:36 AM  Patient seen, examined. Available data reviewed. Agree with findings, assessment, and plan as outlined by Ronie Spies, PA-C. I have seen this patient in consultation in December 2013 and know her from that admission. She has a complex medical history and is very unfortunate. She is unable to provide any meaningful history, and this has been the case for some time now. I don't appreciate any change in her mental status. Exam reveals an awake, alert woman in NAD. Lungs are clear and heart is regular without murmur. Legs are diffusely tender with trace edema. Primary cardiac issues are recurrent ICD shocks for VT/VF and severe cardiomyopathy. There are many confounding issues, most notably that she has a history of behavioral problems related to her stroke and has to be coaxed into taking her meds much of the time. Since she has had another ICD shock for VF, I would recommend  increasing her Amiodarone to 400 mg daily. She will need serial monitoring of CXR, LFT's, and TSH. I do not know of anything from a cardiac perspective that we can offer her to improve her overall situation. She is not a candidate for invasive evaluation or advanced therapies.  Tonny Bollman, M.D. 11/07/2012 5:04 AM

## 2012-11-06 NOTE — ED Notes (Addendum)
Unsuccessful IV attempts by Mario Coronado, RN, Clydie Braun, RN and [place], [place].   IV team called.

## 2012-11-06 NOTE — Telephone Encounter (Signed)
Records Rec From [facility]  Gave to [facility] 11/06/12/KM

## 2012-11-06 NOTE — Progress Notes (Signed)
Utilization review completed.  

## 2012-11-06 NOTE — Telephone Encounter (Signed)
Faxed Release's To  [facility] @ [phone] [facility] @ [phone] 11/03/12/KM

## 2012-11-06 NOTE — ED Notes (Signed)
Per EMS, patient started having chest pain when her defibrillator started firing.  EMS advised that it has not fired for them.

## 2012-11-06 NOTE — Progress Notes (Addendum)
Spoke to Dr. Susie Cassette, she stated that this is not a stroke ruleout patient. I also notified her of phone call from centralized telemetry stating the patient had 10 beats VTACH, no new orders received.

## 2012-11-06 NOTE — Telephone Encounter (Signed)
ROI faxed to  [facility]  [facility] @ [phone] [facility] @ [phone] 11/06/12/KM

## 2012-11-06 NOTE — Discharge Summary (Addendum)
[facility] History and Physical  Elizabeth Love ZOX:[id number] DOB: 1956-09-12 DOA: 11/06/2012  Referring physician: Celene Kras, MD  PCP: [name], [name], MD   Chief Complaint: chest pain   HPI:  57 year old female presents to [facility] complaining of headache, chest pain and felt her defibrillator fire today. Interrogation did not reveal any shocks today but did show an episode of VF terminated appropriately by ICD on 10/26/12. History is extremely limited by her aphasia. At some times she is able to answer questions with simple words/phrases, but many other times she just says "I don't know." She sometimes will answer yes while shaking her head no. Headache is constant, "throbbing," chronic for many years, ?not worse today. Chest pain is sharp, constant, worse today. In the ER, CXR with probable mild CHF small effusions and pulm vasc congestion. Hgb 10.3 (c/w prior), POC troponin 0.12, pBNP 12374 (BNP was 1906 yesterday). 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   She also had a stroke that has left her with residual right-sided weakness and expressive aphasia. She moved to [place] 2-3 years ago.She was noted to be in AFib with RVR. Chest CT was negative for pulmonary embolism. She did have some evidence of volume overload. Echocardiogram 8/13: Mild LVH, EF 10%, severe LAE, mild RVE, moderate TR, PASP 39, small pericardial effusion. She has had significant problems with behavioral issues likely related to her stroke. She was not felt to be a long-term anticoagulation candidate at that time, butin 06/2012 was found to have a right lower extremity DVT, protein C deficiency (seen by heme), and placed on Xarelto. She was then admitted twice in 12/13. She was seen for abdominal pain/UTI, and was seen by cardiology because of VT & subsequent ICD shock x5 in the setting of hypokalemia - spironolactone was added. She was hospitalized in 08/2012 with a/c systolic CHF with positive  enzymes felt related to strain. There were significant issues during her hospitalization trying to get her to take her medications    Most Recent Cardiac Studies:  2D echo 04/2012 Study Conclusions - Left ventricle: The cavity size was severely dilated. Wall thickness was increased in a pattern of mild LVH. The estimated ejection fraction was 10%. Diffuse hypokinesis. - Mitral valve: Severe regurgitation. - Left atrium: The atrium was severely dilated. - Right ventricle: The cavity size was mildly dilated. - Right atrium: The atrium was mildly dilated. - Tricuspid valve: Moderate regurgitation. - Pulmonary arteries: PA peak pressure: 39mm Hg (S). - Pericardium, extracardiac: A small, free-flowing pericardial effusion was identified. The fluid had no internal echoes.There was no evidence of hemodynamic compromise.        Review of Systems: negative for the following  Constitutional: Denies fever, chills, diaphoresis, appetite change and fatigue.  HEENT: Denies photophobia, eye pain, redness, hearing loss, ear pain, congestion, sore throat, rhinorrhea, sneezing, mouth sores, trouble swallowing, neck pain, neck stiffness and tinnitus.  Respiratory: Denies SOB, DOE, cough, chest tightness, and wheezing.  Cardiovascular: Denies chest pain, palpitations and leg swelling.  Gastrointestinal: Denies nausea, vomiting, abdominal pain, diarrhea, constipation, blood in stool and abdominal distention.  Genitourinary: Denies dysuria, urgency, frequency, hematuria, flank pain and difficulty urinating.  Musculoskeletal: Denies myalgias, back pain, joint swelling, arthralgias and gait problem.  Skin: Denies pallor, rash and wound.  Neurological: Denies dizziness, seizures, syncope, weakness, light-headedness, numbness and headaches.  Hematological: Denies adenopathy. Easy bruising, personal or family bleeding history  Psychiatric/Behavioral: Denies suicidal ideation, mood changes, confusion, nervousness,  sleep disturbance and agitation       Past Medical History  Diagnosis Date  . Seizures   . Hypertension   . Stroke     2007 - R sided weakness and expressive aphasia with h/o behavioral problems related to this  . Asthma   . Diabetes mellitus   . 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 Chronic systolic CHF (congestive heart failure)     a. s/p AICD ~2007 ([employer]) - previous care [facility]. No known hx CAD. b. EF 10% by echo 04/2012.  Marland Kitchen Atrial fibrillation     04/2012 - not felt to be an anticoag candidate long term at that time, but later had DVT so was on placed on Xarelto  . Coagulopathy     "Protein C deficiency"  . History of DVT (deep vein thrombosis)     a. 06/2012: RLE DVT, + protein C deficiency, placed on Xarelto. s/p IVC filter  . Noncompliance     Due to mental status, family has had to coax her to take medicines  . Hypothyroidism   . VT (ventricular tachycardia)     a. h/o ICD ~2007-2008. b. Adm 08/2012 with UTI, had VT/ICD shock x 5 (hypokalemic)  . Protein C deficiency      Past Surgical History  Procedure Laterality Date  . Cholecystectomy    . Appendectomy    . Pacemaker insertion    . Vena cava filter placement  07/19/2012    Procedure: INSERTION VENA-CAVA FILTER;  Surgeon: Larina Earthly, MD;  Location: [facility] OR;  Service: Vascular;  Laterality: [name];  . Insert / replace / remove pacemaker      ICD  . Abdominal hysterectomy        Social History:  reports that she has quit smoking. She does not have any smokeless tobacco history on file. She reports that she does not drink alcohol or use illicit drugs.    Allergies  Allergen Reactions  . Penicillins Rash  . Sulfa Antibiotics Rash    Family History  Problem Relation Age of Onset  . Coronary artery disease Neg Hx      Prior to Admission medications   Medication Sig Start Date End Date Taking? Authorizing Provider  albuterol (PROVENTIL) (2.5 MG/3ML) 0.083% nebulizer solution Take 2.5 mg by  nebulization every 6 (six) hours as needed. For cough or wheeze    Historical Provider, MD  ALPRAZolam Prudy Feeler) 0.5 MG tablet Take 0.5 mg by mouth at bedtime as needed. For sleep    Historical Provider, MD  amiodarone (PACERONE) 200 MG tablet Take 200 mg by mouth daily.    Historical Provider, MD  aspirin 81 MG EC tablet Take 1 tablet (81 mg total) by mouth daily. Swallow whole. 09/19/12   Roger A Arguello, PA-C  digoxin (LANOXIN) 0.125 MG tablet Take 1 tablet (0.125 mg total) by mouth daily. 08/29/12   Penny Pia, MD  escitalopram (LEXAPRO) 10 MG tablet Take 10 mg by mouth daily.    Historical Provider, MD  feeding supplement (ENSURE COMPLETE) LIQD Take 237 mLs by mouth 2 (two) times daily between meals. 09/19/12   Roger A Arguello, PA-C  furosemide (LASIX) 20 MG tablet Take 1 tablet (20 mg total) by mouth as needed (For sudden weight gain, swelling or shortness of breath). 09/19/12   Roger A Arguello, PA-C  glipiZIDE (GLUCOTROL XL) 5 MG 24 hr tablet Take 1 tablet (5 mg total) by mouth daily. 07/22/12  Catarina Hartshorn, MD  levETIRAcetam (KEPPRA) 500 MG tablet Take 1,000 mg by mouth 2 (two) times daily.    Historical Provider, MD  levothyroxine (SYNTHROID, LEVOTHROID) 50 MCG tablet Take 50 mcg by mouth daily.    Historical Provider, MD  lisinopril (PRINIVIL,ZESTRIL) 5 MG tablet Take 5 mg by mouth daily.    Historical Provider, MD  metoprolol tartrate (LOPRESSOR) 25 MG tablet Take 3 tablets (75 mg total) by mouth 2 (two) times daily. 08/29/12   Penny Pia, MD  omeprazole (PRILOSEC) 20 MG capsule Take 20 mg by mouth 2 (two) times daily.    Historical Provider, MD  oxyCODONE-acetaminophen (PERCOCET) 10-325 MG per tablet Take 1 tablet by mouth 3 (three) times daily. Scheduled per pharmacy    Historical Provider, MD  QUEtiapine (SEROQUEL XR) 50 MG TB24 Take 50 mg by mouth daily.    Historical Provider, MD  Rivaroxaban (XARELTO) 20 MG TABS Take 1 tablet (20 mg total) by mouth daily with supper. 08/29/12   Penny Pia, MD  spironolactone (ALDACTONE) 25 MG tablet Take 1 tablet (25 mg total) by mouth daily. 08/29/12   Penny Pia, MD     Physical Exam: Filed Vitals:   11/06/12 1015 11/06/12 1045 11/06/12 1100 11/06/12 1130  BP: 147/91 156/91 153/87 152/96  Pulse: 75 83 80 79  Temp:      TempSrc:      Resp: 20 21 20 16   SpO2: 97% 96% 94% 97%    General: Well developed chronically ill AAF in no acute distress.  Head: Normocephalic, atraumatic, sclera non-icteric, no xanthomas, nares are without discharge.  Neck: Negative for carotid bruits.  Lungs: Coarse anteriorly without obvious wheezes, rales or rhonchi. Breathing is unlabored.  Heart: RRR with S1 S2. No murmurs, rubs, or gallops appreciated.  Abdomen: Soft, non-tender, non-distended with normoactive bowel sounds. No hepatomegaly. No rebound/guarding. No obvious abdominal masses.  Msk: Did not cooperate with MSK exam on R side, but L strength in tact.  Extremities: No clubbing or cyanosis. No edema. Distal pedal pulses are 2+ and equal bilaterally.  Neuro: Alert and oriented to self, not place/situation/date. No facial asymmetry. Slow to speak, expressive aphasia noted. R sided weaknes.  Psych: Flat affect.        Labs on Admission:    Basic Metabolic Panel:  Recent Labs Lab 11/05/12 1607 11/06/12 0816  NA 141 142  K 4.0 3.6  CL 107 104  CO2 26 25  GLUCOSE 96 123*  BUN 16 15  CREATININE 1.0 0.78  CALCIUM 9.3 9.4  MG 1.9  --    Liver Function Tests: No results found for this basename: AST, ALT, ALKPHOS, BILITOT, PROT, ALBUMIN,  in the last 168 hours No results found for this basename: LIPASE, AMYLASE,  in the last 168 hours No results found for this basename: AMMONIA,  in the last 168 hours CBC:  Recent Labs Lab 11/06/12 0816  WBC 5.0  NEUTROABS 3.4  HGB 10.3*  HCT 33.5*  MCV 88.2  PLT 255   Cardiac Enzymes: No results found for this basename: CKTOTAL, CKMB, CKMBINDEX, TROPONINI,  in the last 168  hours  BNP (last 3 results)  Recent Labs  09/14/12 0401 11/05/12 1607 11/06/12 0825  PROBNP 15271.0* 1906.0* 12374.0*      CBG: No results found for this basename: GLUCAP,  in the last 168 hours  Radiological Exams on Admission: Dg Chest 2 View  11/06/2012  *RADIOLOGY REPORT*  Clinical Data: Chest pain, history of diabetes  CHEST - 2 VIEW  Comparison: Chest x-ray of 09/14/2012  Findings: Moderate cardiomegaly is stable.  There does appear to be pulmonary vascular congestion present with small effusions, suggesting mild congestive heart failure.  An AICD lead is noted. The bones are osteopenic.  IMPRESSION: Probable mild CHF with small effusions and pulmonary vascular congestion.   Original Report Authenticated By: Dwyane Dee, M.D.    Ct Head Wo Contrast  11/06/2012  *RADIOLOGY REPORT*  Clinical Data: headache, chest pain.  CT HEAD WITHOUT CONTRAST  Technique:  Contiguous axial images were obtained from the base of the skull through the vertex without contrast.  Comparison: 08/27/2012  Findings: Large old left MCA infarct with encephalomalacia, stable. No acute infarction or hemorrhage.  No hydrocephalus.  No acute calvarial abnormality. Visualized paranasal sinuses and mastoids clear.  Orbital soft tissues unremarkable.  IMPRESSION: Old large left MCA infarct.  No acute intracranial abnormality.   Original Report Authenticated By: Charlett Nose, M.D.     EKG: Independently reviewed. Date: 11/06/2012  Rate: 76  Rhythm: normal sinus rhythm  QRS Axis: normal  Intervals: PR prolonged  ST/T Wave abnormalities: nonspecific ST/T changes  Conduction Disutrbances:first-degree A-V block and nonspecific intraventricular conduction delay  Narrative Interpretation:  Old EKG Reviewed: unchanged   Assessment/Plan Principal Problem:   Chest pain at rest Active Problems:   Chronic systolic heart failure   Atrial fibrillation   Hypothyroidism   Coagulopathy   Cardiomyopathy   Seizure  disorder   1. Chest pain with abnormal troponin in the setting of ischemic cardiomyopathy, cardiology consulted, follow troponin, mild elevation in troponin could be secondary to chronic systolic heart failure. Continue aspirin, beta blocker 2. AICD per cardiology the ICD did not fire today, last shock was 10/26/12 for ventricular fibrillation 3. CVA with residue right-sided weakness and if easier, CT scan negative for acute CVA,cannot do MRI because of AICD 4. History of paroxysmal atrial fibrillation continue digoxin and metoprolol and amiodarone 5. 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 History of seizure disorder on Keppra 7. Diabetes mellitus type 2, continue glipizide, sliding scale insulin, check hemoglobin A1c  Code Status:   full Family Communication: bedside Disposition Plan: admit   Time spent: 70 mins   [facility] Pager [phone]  If 7PM-7AM, please contact night-coverage [email or web address] Password [facility] 11/06/2012, 12:08 PM

## 2012-11-06 NOTE — ED Notes (Signed)
[employer] representative trying to interrogate again.

## 2012-11-06 NOTE — ED Notes (Signed)
Cardiology at bedside for evaluation.

## 2012-11-06 NOTE — ED Notes (Signed)
IV team RNs x 2 at bedside to get access.

## 2012-11-06 NOTE — Progress Notes (Signed)
Spoke to Dr. Susie Cassette about patient's inability to answer orientation questions she stated she felt it was related to expressive aphasia at times.  No new orders received.  Will continue to monitor.

## 2012-11-07 ENCOUNTER — Telehealth: Payer: Self-pay | Admitting: Physician Assistant

## 2012-11-07 LAB — BASIC METABOLIC PANEL
CO2: 26 mEq/L (ref 19–32)
Chloride: 107 mEq/L (ref 96–112)
Potassium: 3.5 mEq/L (ref 3.5–5.1)
Sodium: 141 mEq/L (ref 135–145)

## 2012-11-07 LAB — GLUCOSE, CAPILLARY
Glucose-Capillary: 89 mg/dL (ref 70–99)
Glucose-Capillary: 123 mg/dL — ABNORMAL HIGH (ref 70–99)
Glucose-Capillary: 123 mg/dL — ABNORMAL HIGH (ref 70–99)
Glucose-Capillary: 159 mg/dL — ABNORMAL HIGH (ref 70–99)

## 2012-11-07 LAB — CBC
Hemoglobin: 9.3 g/dL — ABNORMAL LOW (ref 12.0–15.0)
MCHC: 30.6 g/dL (ref 30.0–36.0)
RBC: 3.41 MIL/uL — ABNORMAL LOW (ref 3.87–5.11)
WBC: 6.1 10*3/uL (ref 4.0–10.5)

## 2012-11-07 LAB — HEMOGLOBIN A1C: Mean Plasma Glucose: 134 mg/dL — ABNORMAL HIGH (ref <117)

## 2012-11-07 MED ORDER — POTASSIUM CHLORIDE CRYS ER 20 MEQ PO TBCR
20.0000 meq | EXTENDED_RELEASE_TABLET | Freq: Once | ORAL | Status: AC
  Administered 2012-11-07: 20 meq via ORAL
  Filled 2012-11-07: qty 1

## 2012-11-07 MED ORDER — AMIODARONE HCL 200 MG PO TABS
400.0000 mg | ORAL_TABLET | Freq: Every day | ORAL | Status: DC
  Administered 2012-11-07 – 2012-11-08 (×2): 400 mg via ORAL
  Filled 2012-11-07 (×3): qty 2

## 2012-11-07 MED ORDER — POTASSIUM CHLORIDE 20 MEQ/15ML (10%) PO LIQD
20.0000 meq | Freq: Every day | ORAL | Status: DC
  Filled 2012-11-07 (×2): qty 15

## 2012-11-07 MED ORDER — POTASSIUM CHLORIDE CRYS ER 10 MEQ PO TBCR
20.0000 meq | EXTENDED_RELEASE_TABLET | Freq: Every day | ORAL | Status: DC
  Administered 2012-11-07 – 2012-11-08 (×2): 20 meq via ORAL
  Filled 2012-11-07 (×2): qty 2

## 2012-11-07 MED ORDER — POTASSIUM CHLORIDE CRYS ER 20 MEQ PO TBCR: 40.0000 meq | EXTENDED_RELEASE_TABLET | Freq: Once | ORAL | Status: DC

## 2012-11-07 MED ORDER — OXYCODONE HCL 5 MG PO TABS
5.0000 mg | ORAL_TABLET | Freq: Once | ORAL | Status: AC
  Administered 2012-11-07: 5 mg via ORAL

## 2012-11-07 NOTE — Progress Notes (Signed)
Patient: Elizabeth Love / Admit Date: 11/06/2012 / Date of Encounter: 11/07/2012, 6:41 AM   Subjective  Denies pain. Aphasic at baseline. Laying flat in bed without dypsnea   Objective   Telemetry: NSR occ PVCs, 6 beats NSVT Physical Exam: Filed Vitals:   11/07/12 0502  BP: 100/54  Pulse: 60  Temp: 98.8 F (37.1 C)  Resp:    General: Chronically ill thin AAF in no acute distress.  Head: Normocephalic, atraumatic, sclera non-icteric, no xanthomas, nares are without discharge.  Neck: Negative for carotid bruits. No JVD. Lungs: Decreased BS at bases but no obvious wheezes, rales or rhonchi. She does not always cooperate with inspiratory effort. Breathing is unlabored.  Heart: RRR with S1 S2. No murmurs, rubs, or gallops appreciated.  Abdomen: Soft, non-tender, non-distended with normoactive bowel sounds. No hepatomegaly. No rebound/guarding. No obvious abdominal masses.  Msk: Did not cooperate with MSK exam. Extremities: No clubbing or cyanosis. Tr sockline edema. Distal pedal pulses are 2+ and equal bilaterally.  Neuro: Does not speak this morning but did follow commands. No facial asymmetry. R sided weakness is present. Psych: Flat affect.    Intake/Output Summary (Last 24 hours) at 11/07/12 0641 Last data filed at 11/06/12 1700  Gross per 24 hour  Intake    300 ml  Output      0 ml  Net    300 ml    Inpatient Medications:  . amiodarone  400 mg Oral Daily  . aspirin EC  81 mg Oral Daily  . digoxin  0.125 mg Oral Daily  . escitalopram  10 mg Oral Daily  . feeding supplement  237 mL Oral BID BM  . glipiZIDE  5 mg Oral QAC breakfast  . insulin aspart  0-15 Units Subcutaneous TID WC  . levETIRAcetam  1,000 mg Oral BID  . levothyroxine  50 mcg Oral QAC breakfast  . lisinopril  5 mg Oral Daily  . metoprolol tartrate  75 mg Oral BID  . QUEtiapine  50 mg Oral Daily  . Rivaroxaban  20 mg Oral Q supper  . sodium chloride  3 mL Intravenous Q12H  . sodium chloride  3 mL  Intravenous Q12H  . spironolactone  25 mg Oral Daily    Labs:  Recent Labs  11/05/12 1607 11/06/12 0816 11/06/12 1559 11/07/12 0325  NA 141 142  --  141  K 4.0 3.6  --  3.5  CL 107 104  --  107  CO2 26 25  --  26  GLUCOSE 96 123*  --  89  BUN 16 15  --  13  CREATININE 1.0 0.78  --  0.83  CALCIUM 9.3 9.4  --  8.7  MG 1.9  --  1.7  --     Recent Labs  11/06/12 1559  AST 20  ALT 10  ALKPHOS 63  BILITOT 1.3*  PROT 7.6  ALBUMIN 3.2*    Recent Labs  11/06/12 0816  WBC 5.0  NEUTROABS 3.4  HGB 10.3*  HCT 33.5*  MCV 88.2  PLT 255    Recent Labs  11/06/12 1559 11/06/12 1925 11/07/12 0325  TROPONINI <0.30 <0.30 <0.30    Recent Labs  11/06/12 1559  HGBA1C 6.3*    Radiology/Studies:  Dg Chest 2 View2/19/2014  *RADIOLOGY REPORT*  Clinical Data: Chest pain, history of diabetes  CHEST - 2 VIEW  Comparison: Chest x-ray of 09/14/2012  Findings: Moderate cardiomegaly is stable.  There does appear to be pulmonary vascular congestion  present with small effusions, suggesting mild congestive heart failure.  An AICD lead is noted. The bones are osteopenic.  IMPRESSION: Probable mild CHF with small effusions and pulmonary vascular congestion.   Original Report Authenticated By: Dwyane Dee, M.D.    Ct Head Wo Contrast2/19/2014  *RADIOLOGY REPORT*  Clinical Data: headache, chest pain.  CT HEAD WITHOUT CONTRAST  Technique:  Contiguous axial images were obtained from the base of the skull through the vertex without contrast.  Comparison: 08/27/2012  Findings: Large old left MCA infarct with encephalomalacia, stable. No acute infarction or hemorrhage.  No hydrocephalus.  No acute calvarial abnormality. Visualized paranasal sinuses and mastoids clear.  Orbital soft tissues unremarkable.  IMPRESSION: Old large left MCA infarct.  No acute intracranial abnormality.   Original Report Authenticated By: Charlett Nose, M.D.      Assessment and Plan  1. Headache - per IM 2. Chest pain, with  borderline POC troponin (no known CAD by record) and negative subsequent troponins 3. Acute on chronic systolic CHF  4. ICD discharge - did not fire today, but did have appropriate shock 10/26/12 for VF (has h/o VT)  5. CVA with residual significant aphasia and R sided weakness  6. Anemia  7. H/o PAF, currently NSR 8. RLE DVT 06/2012 with protein C deficiency and IVC filter, on Xarelto  9. H/o noncompliance - family has had to coax her into meds at home previously per chart 10. HTN, controlled 11. H/o seizure disorder 12. Abnormal TSH - per IM  Subsequent troponins negative x 3. pBNP was up yesterday but weight is down from prior. She does not look acutely volume overloaded but if she develops symptoms could rx her home dose Lasix (takes PRN). Amiodarone increased yesterday for her episode of VF on 10/26/12. Will give KCl this AM to try to keep K>4. Continue sprinolactone, ACEI, and BB. Will defer decision of continuing digoxin (given concomitant amiodarone) to rounding MD today. No invasive cardiology eval or advanced therapies planned in light of overall comorbidities.  Signed, Ronie Spies PA-C Patient examined and agree except changes made.With history of NSVT and hypokalemia will discontinue Digoxin. Try to K above 4.  Valera Castle, MD 11/07/2012 10:12 AM

## 2012-11-07 NOTE — Progress Notes (Signed)
[facility] PROGRESS NOTE  Elizabeth Love ZOX:[id number] DOB: February 26, 1956 DOA: 11/06/2012 PCP: Elizabeth Love, Elizabeth Dach, MD  Assessment/Plan: 1. Chest pain with abnormal troponin in the setting of ischemic cardiomyopathy, : chest pain resolved.  mild elevation in troponin could be secondary to chronic systolic heart failure. Rest of the troponin levels negative.  Continue aspirin, beta blocker 2. AICD per cardiology the ICD did not fire today, last shock was 10/26/12 for ventricular fibrillation 3. CVA with residue right-sided weakness and if easier, CT scan negative for acute CVA,cannot do MRI because of AICD 4. History of paroxysmal atrial fibrillation continue  metoprolol and amiodarone 5. 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 History of seizure disorder on Keppra 7. Diabetes mellitus type 2, continue glipizide, sliding scale insulin, hgba1c if 6.3 CBG (last 3)   Recent Labs  11/06/12 2025 11/07/12 0735 11/07/12 1132  GLUCAP 95 89 123*   8. Abnormal TSH: WILL obtain free t4. .  9. Anemia: normocytic. And stable .    Code Status: full code Family Communication: none at bedside Disposition Plan: possibly tomorrow.    Consultants:  cardiology  Procedures  none    HPI/Subjective: Headache.  Objective: Filed Vitals:   11/06/12 1301 11/06/12 2020 11/07/12 0502 11/07/12 1039  BP: 139/84 110/68 100/54 111/75  Pulse: 80 81 60 70  Temp: 98.8 F (37.1 C) 98.4 F (36.9 C) 98.8 F (37.1 C) 98.8 F (37.1 C)  TempSrc: Oral Oral Oral Oral  Resp: 18     Weight: 51.12 kg (112 lb 11.2 oz)  51.665 kg (113 lb 14.4 oz)   SpO2: 99% 94% 95% 97%    Intake/Output Summary (Last 24 hours) at 11/07/12 1137 Last data filed at 11/07/12 0800  Gross per 24 hour  Intake    660 ml  Output      0 ml  Net    660 ml   Filed Weights   11/06/12 1301 11/07/12 0502  Weight: 51.12 kg (112 lb 11.2 oz) 51.665 kg (113 lb 14.4 oz)    Exam:  Lungs: Coarse  anteriorly without obvious wheezes, rales or rhonchi. Breathing is unlabored.  Heart: RRR with S1 S2. No murmurs, rubs, or gallops appreciated.  Abdomen: Soft, non-tender, non-distended with normoactive bowel sounds. No hepatomegaly. No rebound/guarding. No obvious abdominal masses.  Msk: Did not cooperate with MSK exam on R side, but L strength in tact.  Extremities: No clubbing or cyanosis. No edema. Distal pedal pulses are 2+ and equal bilaterally.  Neuro: Alert and oriented to self, not place/situation/date. No facial asymmetry. Slow to speak, expressive aphasia noted. R sided weaknes.   Data Reviewed: Basic Metabolic Panel:  Recent Labs Lab 11/05/12 1607 11/06/12 0816 11/06/12 1559 11/07/12 0325  NA 141 142  --  141  K 4.0 3.6  --  3.5  CL 107 104  --  107  CO2 26 25  --  26  GLUCOSE 96 123*  --  89  BUN 16 15  --  13  CREATININE 1.0 0.78  --  0.83  CALCIUM 9.3 9.4  --  8.7  MG 1.9  --  1.7  --    Liver Function Tests:  Recent Labs Lab 11/06/12 1559  AST 20  ALT 10  ALKPHOS 63  BILITOT 1.3*  PROT 7.6  ALBUMIN 3.2*   No results found for this basename: LIPASE, AMYLASE,  in the last 168 hours No results found for this basename: AMMONIA,  in the last 168 hours CBC:  Recent Labs Lab 11/06/12 0816 11/07/12 0832  WBC 5.0 6.1  NEUTROABS 3.4  --   HGB 10.3* 9.3*  HCT 33.5* 30.4*  MCV 88.2 89.1  PLT 255 228   Cardiac Enzymes:  Recent Labs Lab 11/06/12 1559 11/06/12 1925 11/07/12 0325  TROPONINI <0.30 <0.30 <0.30   BNP (last 3 results)  Recent Labs  09/14/12 0401 11/05/12 1607 11/06/12 0825  PROBNP 15271.0* 1906.0* 12374.0*   CBG:  Recent Labs Lab 11/06/12 1325 11/06/12 1709 11/06/12 2025 11/07/12 0735  GLUCAP 76 137* 95 89    No results found for this or any previous visit (from the past 240 hour(s)).   Studies: Dg Chest 2 View  11/06/2012  *RADIOLOGY REPORT*  Clinical Data: Chest pain, history of diabetes  CHEST - 2 VIEW  Comparison:  Chest x-ray of 09/14/2012  Findings: Moderate cardiomegaly is stable.  There does appear to be pulmonary vascular congestion present with small effusions, suggesting mild congestive heart failure.  An AICD lead is noted. The bones are osteopenic.  IMPRESSION: Probable mild CHF with small effusions and pulmonary vascular congestion.   Original Report Authenticated By: Dwyane Dee, M.D.    Ct Head Wo Contrast  11/06/2012  *RADIOLOGY REPORT*  Clinical Data: headache, chest pain.  CT HEAD WITHOUT CONTRAST  Technique:  Contiguous axial images were obtained from the base of the skull through the vertex without contrast.  Comparison: 08/27/2012  Findings: Large old left MCA infarct with encephalomalacia, stable. No acute infarction or hemorrhage.  No hydrocephalus.  No acute calvarial abnormality. Visualized paranasal sinuses and mastoids clear.  Orbital soft tissues unremarkable.  IMPRESSION: Old large left MCA infarct.  No acute intracranial abnormality.   Original Report Authenticated By: Charlett Nose, M.D.     Scheduled Meds: . amiodarone  400 mg Oral Daily  . aspirin EC  81 mg Oral Daily  . escitalopram  10 mg Oral Daily  . feeding supplement  237 mL Oral BID BM  . glipiZIDE  5 mg Oral QAC breakfast  . insulin aspart  0-15 Units Subcutaneous TID WC  . levETIRAcetam  1,000 mg Oral BID  . levothyroxine  50 mcg Oral QAC breakfast  . lisinopril  5 mg Oral Daily  . metoprolol tartrate  75 mg Oral BID  . potassium chloride  20 mEq Oral Daily  . QUEtiapine  50 mg Oral Daily  . Rivaroxaban  20 mg Oral Q supper  . sodium chloride  3 mL Intravenous Q12H  . sodium chloride  3 mL Intravenous Q12H  . spironolactone  25 mg Oral Daily   Continuous Infusions:   Principal Problem:   Chest pain at rest Active Problems:   Chronic systolic heart failure   Atrial fibrillation   Hypothyroidism   Coagulopathy   Cardiomyopathy   Seizure disorder     [facility]  [facility] Pager [phone]. If  8PM-8AM, please contact night-coverage at [email or web address], password [facility] 11/07/2012, 11:37 AM  LOS: 1 day

## 2012-11-07 NOTE — Telephone Encounter (Signed)
Records Rec From [facility], gave to [facility]  11/07/12/KM

## 2012-11-08 ENCOUNTER — Observation Stay (HOSPITAL_COMMUNITY): Payer: Medicare Other

## 2012-11-08 LAB — BASIC METABOLIC PANEL
CO2: 23 mEq/L (ref 19–32)
Chloride: 104 mEq/L (ref 96–112)
Glucose, Bld: 135 mg/dL — ABNORMAL HIGH (ref 70–99)
Potassium: 5.3 mEq/L — ABNORMAL HIGH (ref 3.5–5.1)
Sodium: 137 mEq/L (ref 135–145)

## 2012-11-08 LAB — GLUCOSE, CAPILLARY

## 2012-11-08 MED ORDER — AMIODARONE HCL 400 MG PO TABS: 400.0000 mg | ORAL_TABLET | Freq: Every day | ORAL | Status: DC

## 2012-11-08 NOTE — Care Management Note (Signed)
    Page 1 of 2   11/08/2012     1:53:33 PM   CARE MANAGEMENT NOTE 11/08/2012  Patient:  Elizabeth Love, Elizabeth Love   Account Number:  [id number]  Date Initiated:  11/08/2012  Documentation initiated by:  GRAVES-BIGELOW,Tyrail Grandfield  Subjective/Objective Assessment:   Pt in with cp. Pt with hx of stroke. Plan for home with h services. Pt lives with son and has supervision.     Action/Plan:   Cm did speak to son and son is agreeable to [facility] services with [facility]. Referral made for services. SOC to tbegin within 24-48 hours post d/c.   Anticipated DC Date:  11/08/2012   Anticipated DC Plan:  HOME W HOME HEALTH SERVICES      DC Planning Services  CM consult      [facility] Choice  HOME HEALTH   Choice offered to / List presented to:  C-4 Adult Children        HH arranged  HH-1 RN  HH-10 DISEASE MANAGEMENT  HH-2 PT  HH-3 OT  HH-4 NURSE'S AIDE  HH-6 SOCIAL WORKER      HH agency  [facility]   Status of service:  Completed, signed off Medicare Important Message given?   (If response is "NO", the following Medicare IM given date fields will be blank) Date Medicare IM given:   Date Additional Medicare IM given:    Discharge Disposition:  HOME W HOME HEALTH SERVICES  Per UR Regulation:  Reviewed for med. necessity/level of care/duration of stay  If discussed at Long Length of Stay Meetings, dates discussed:    Comments:

## 2012-11-08 NOTE — Progress Notes (Signed)
Attempted to meet with patient to assess for ?d/c needs but she appears drowsy and is not engaged to answer questions or speak with me. She gave me permission to call her son_ Feliz Beam- message left for him to call me back to further assess. Reece Levy, MSW, Theresia Majors [phone]

## 2012-11-08 NOTE — Discharge Summary (Signed)
Physician Discharge Summary  Elizabeth Love WJX:[id number] DOB: [birth year] DOA: 11/06/2012  PCP: [name], [name], MD  Admit date: 11/06/2012 Discharge date: 11/08/2012  Time spent: 40  minutes  Recommendations for Outpatient Follow-up:  1. Follow up with cardiology as recommended outpatient.  2. Follow up with bmp tomorrow to check potassium level.   Discharge Diagnoses:  Principal Problem:   Chest pain at rest Active Problems:   Chronic systolic heart failure   Atrial fibrillation   Hypothyroidism   Coagulopathy   Cardiomyopathy   Seizure disorder   Discharge Condition: stable  Diet recommendation: low sodium diet  Filed Weights   11/06/12 1301 11/07/12 0502 11/08/12 0612  Weight: 51.12 kg (112 lb 11.2 oz) 51.665 kg (113 lb 14.4 oz) 52.164 kg (115 lb)    History of present illness:  57 year old female presents to [facility] complaining of headache, chest pain and felt her defibrillator fire today. Interrogation did not reveal any shocks today but did show an episode of VF terminated appropriately by ICD on 10/26/12. History is extremely limited by her aphasia. At some times she is able to answer questions with simple words/phrases, but many other times she just says "I don't know." She sometimes will answer yes while shaking her head no. Headache is constant, "throbbing," chronic for many years, ?not worse today. Chest pain is sharp, constant, worse today. In the ER, CXR with probable mild CHF small effusions and pulm vasc congestion. Hgb 10.3 (c/w prior), POC troponin 0.12, pBNP 12374 (BNP was 1906 yesterday). 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  She also had a stroke that has left her with residual right-sided weakness and expressive aphasia. She moved to [place] 2-3 years ago.She was noted to be in AFib with RVR. Chest CT was negative for pulmonary embolism. She did have some evidence of volume overload. Echocardiogram 8/13: Mild LVH, EF 10%, severe LAE,  mild RVE, moderate TR, PASP 39, small pericardial effusion. She has had significant problems with behavioral issues likely related to her stroke. She was not felt to be a long-term anticoagulation candidate at that time, butin 06/2012 was found to have a right lower extremity DVT, protein C deficiency (seen by heme), and placed on Xarelto. She was then admitted twice in 12/13. She was seen for abdominal pain/UTI, and was seen by cardiology because of VT & subsequent ICD shock x5 in the setting of hypokalemia - spironolactone was added. She was hospitalized in 08/2012 with a/c systolic CHF with positive enzymes felt related to strain. There were significant issues during her hospitalization trying to get her to take her medications      Hospital Course:    1. Chest pain with abnormal troponin in the setting of ischemic cardiomyopathy, : chest pain resolved. mild elevation in troponin could be secondary to chronic systolic heart failure. Rest of the troponin levels negative. Continue aspirin, beta blocker. Follow up with cardiology as outpatient in one week.  2. AICD per cardiology  last shock was 10/26/12 for ventricular fibrillation. 3. CVA with residue right-sided weakness and if easier, CT scan negative for acute CVA,cannot do MRI because of AICD. 4. History of paroxysmal atrial fibrillation continue metoprolol and amiodarone 5. 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 History of seizure disorder on Keppra 7. Diabetes mellitus type 2, continue glipizide, sliding scale insulin, hgba1c if 6.3 CBG (last 3)   Recent Labs   11/06/12 2025  11/07/12 0735  11/07/12 1132  GLUCAP  95  89  123*    8. Abnormal TSH: free T4 NORMAL.  9. Anemia: normocytic. And stable .  10 Hyperkalemia; possibly from potassium supplementation. Recommend checking K tomorrow by [facility] and notify MD of the results.   Consultations:  cardiology  Discharge Exam: Filed Vitals:   11/07/12 2055 11/08/12  0612 11/08/12 1009 11/08/12 1010  BP: 107/68 102/65 103/65   Pulse: 54 58  62  Temp: 98.6 F (37 C) 98.6 F (37 C)    TempSrc: Oral Oral    Resp: 16 20    Height:      Weight:  52.164 kg (115 lb)    SpO2: 100% 97%      Lungs: good air entry without obvious wheezes, rales or rhonchi. Breathing is unlabored.  Heart: RRR with S1 S2. No murmurs, rubs, or gallops appreciated.  Abdomen: Soft, non-tender, non-distended with normoactive bowel sounds. No hepatomegaly. No rebound/guarding. No obvious abdominal masses.   Extremities: No clubbing or cyanosis. No edema. Distal pedal pulses are 2+ and equal bilaterally.  Neuro: Alert and oriented to self, not place/situation/date. No facial asymmetry. Slow to speak, expressive aphasia noted. R sided weaknes  Discharge Instructions   Future Appointments Provider Department Dept Phone   12/11/2012 3:30 PM Duke Salvia, MD [facility] Main Office [place]) [phone]   02/06/2013 1:45 PM Rollene Rotunda, MD [facility] Main Office [place]) [phone]       Medication List    STOP taking these medications       digoxin 0.125 MG tablet  Commonly known as:  LANOXIN      TAKE these medications       albuterol (2.5 MG/3ML) 0.083% nebulizer solution  Commonly known as:  PROVENTIL  Take 2.5 mg by nebulization every 6 (six) hours as needed. For cough or wheeze     ALPRAZolam 0.5 MG tablet  Commonly known as:  XANAX  Take 0.5 mg by mouth at bedtime as needed. For sleep     amiodarone 400 MG tablet  Commonly known as:  PACERONE  Take 1 tablet (400 mg total) by mouth daily.     aspirin 81 MG EC tablet  Take 1 tablet (81 mg total) by mouth daily. Swallow whole.     escitalopram 10 MG tablet  Commonly known as:  LEXAPRO  Take 10 mg by mouth daily.     feeding supplement Liqd  Take 237 mLs by mouth 2 (two) times daily between meals.     furosemide 20 MG tablet  Commonly known as:  LASIX  Take 1 tablet (20 mg total) by  mouth as needed (For sudden weight gain, swelling or shortness of breath).     glipiZIDE 5 MG 24 hr tablet  Commonly known as:  GLUCOTROL XL  Take 1 tablet (5 mg total) by mouth daily.     levETIRAcetam 500 MG tablet  Commonly known as:  KEPPRA  Take 1,000 mg by mouth 2 (two) times daily.     levothyroxine 50 MCG tablet  Commonly known as:  SYNTHROID, LEVOTHROID  Take 50 mcg by mouth daily.     lisinopril 5 MG tablet  Commonly known as:  PRINIVIL,ZESTRIL  Take 5 mg by mouth daily.     metoprolol tartrate 25 MG tablet  Commonly known as:  LOPRESSOR  Take 25 mg by mouth 2 (two) times daily.     omeprazole 20 MG capsule  Commonly known as:  PRILOSEC  Take 20 mg by  mouth 2 (two) times daily.     oxyCODONE-acetaminophen 10-325 MG per tablet  Commonly known as:  PERCOCET  Take 1 tablet by mouth 3 (three) times daily. Scheduled per pharmacy     QUEtiapine 50 MG Tb24  Commonly known as:  SEROQUEL XR  Take 50 mg by mouth daily.     Rivaroxaban 20 MG Tabs  Commonly known as:  XARELTO  Take 1 tablet (20 mg total) by mouth daily with supper.     spironolactone 25 MG tablet  Commonly known as:  ALDACTONE  Take 1 tablet (25 mg total) by mouth daily.          The results of significant diagnostics from this hospitalization (including imaging, microbiology, ancillary and laboratory) are listed below for reference.    Significant Diagnostic Studies: Dg Chest 2 View  11/06/2012  *RADIOLOGY REPORT*  Clinical Data: Chest pain, history of diabetes  CHEST - 2 VIEW  Comparison: Chest x-ray of 09/14/2012  Findings: Moderate cardiomegaly is stable.  There does appear to be pulmonary vascular congestion present with small effusions, suggesting mild congestive heart failure.  An AICD lead is noted. The bones are osteopenic.  IMPRESSION: Probable mild CHF with small effusions and pulmonary vascular congestion.   Original Report Authenticated By: Dwyane Dee, M.D.    Ct Head Wo  Contrast  11/06/2012  *RADIOLOGY REPORT*  Clinical Data: headache, chest pain.  CT HEAD WITHOUT CONTRAST  Technique:  Contiguous axial images were obtained from the base of the skull through the vertex without contrast.  Comparison: 08/27/2012  Findings: Large old left MCA infarct with encephalomalacia, stable. No acute infarction or hemorrhage.  No hydrocephalus.  No acute calvarial abnormality. Visualized paranasal sinuses and mastoids clear.  Orbital soft tissues unremarkable.  IMPRESSION: Old large left MCA infarct.  No acute intracranial abnormality.   Original Report Authenticated By: Charlett Nose, M.D.     Microbiology: No results found for this or any previous visit (from the past 240 hour(s)).   Labs: Basic Metabolic Panel:  Recent Labs Lab 11/05/12 1607 11/06/12 0816 11/06/12 1559 11/07/12 0325  NA 141 142  --  141  K 4.0 3.6  --  3.5  CL 107 104  --  107  CO2 26 25  --  26  GLUCOSE 96 123*  --  89  BUN 16 15  --  13  CREATININE 1.0 0.78  --  0.83  CALCIUM 9.3 9.4  --  8.7  MG 1.9  --  1.7  --    Liver Function Tests:  Recent Labs Lab 11/06/12 1559  AST 20  ALT 10  ALKPHOS 63  BILITOT 1.3*  PROT 7.6  ALBUMIN 3.2*   No results found for this basename: LIPASE, AMYLASE,  in the last 168 hours No results found for this basename: AMMONIA,  in the last 168 hours CBC:  Recent Labs Lab 11/06/12 0816 11/07/12 0832  WBC 5.0 6.1  NEUTROABS 3.4  --   HGB 10.3* 9.3*  HCT 33.5* 30.4*  MCV 88.2 89.1  PLT 255 228   Cardiac Enzymes:  Recent Labs Lab 11/06/12 1559 11/06/12 1925 11/07/12 0325  TROPONINI <0.30 <0.30 <0.30   BNP: BNP (last 3 results)  Recent Labs  09/14/12 0401 11/05/12 1607 11/06/12 0825  PROBNP 15271.0* 1906.0* 12374.0*   CBG:  Recent Labs Lab 11/07/12 1132 11/07/12 1607 11/07/12 2053 11/08/12 0742 11/08/12 1131  GLUCAP 123* 123* 159* 104* 137*       Signed:  Astor Gentle  [facility] 11/08/2012, 12:14  PM

## 2012-11-08 NOTE — Progress Notes (Signed)
Clinical Social Work Department BRIEF PSYCHOSOCIAL ASSESSMENT 11/08/2012  Patient:  Elizabeth Love, Elizabeth Love     Account Number:  [id number]     Admit date:  11/06/2012  Clinical Social Worker:  Robin Searing  Date/Time:  11/08/2012 11:03 AM  Referred by:  Physician  Date Referred:  11/08/2012 Referred for  SNF Placement   Other Referral:   Interview type:  Family Other interview type:   sonFeliz Beam [phone]    PSYCHOSOCIAL DATA Living Status:  FAMILY Admitted from facility:   Level of care:   Primary support name:  son- Feliz Beam Primary support relationship to patient:  FAMILY Degree of support available:   good    CURRENT CONCERNS Current Concerns  Post-Acute Placement   Other Concerns:    SOCIAL WORK ASSESSMENT / PLAN Patient admitted from home- per son, he ahd his uncle (patient's brother) are also in the home and she has 24 hour care. She also has [facility] services coming out as well as Merchandiser, retail (5 hours day/7 days week)  He does not feel she will need SNF placement and plans return to home at d/c.   Assessment/plan status:  No Further Intervention Required Other assessment/ plan:   Information/referral to community resources:   [facility]  DME  CAPS    PATIENT'S/FAMILY'S RESPONSE TO PLAN OF CARE: Son plans to take patient home at d/c and does not want to pursue placement for her at this time-  I have advised the [facility] of this and she will f/u for HH/DME needs. CSW will sign off at this time-        Reece Levy, MSW, [employer] [phone]

## 2012-11-08 NOTE — Progress Notes (Signed)
Patient: Elizabeth Love / Admit Date: 11/06/2012 / Date of Encounter: 11/08/2012, 9:06 AM   Subjective  Wants pain medication injection. Tearful. No SOB at rest.   Objective   Telemetry: NSR occ PVCs, 6 beats NSVT Physical Exam: Filed Vitals:   11/08/12 0612  BP: 102/65  Pulse: 58  Temp: 98.6 F (37 C)  Resp: 20   General: Chronically ill thin AAF in no acute distress.  Head: Normocephalic, atraumatic, sclera non-icteric, no xanthomas, nares are without discharge.  Neck: Negative for carotid bruits. No JVD. Lungs: Decreased BS at bases but no obvious wheezes, rales or rhonchi. She does not always cooperate with inspiratory effort. Breathing is unlabored.  Heart: RRR with S1 S2. No murmurs, rubs, or gallops appreciated.  Abdomen: Soft, non-tender, non-distended with normoactive bowel sounds. No hepatomegaly. No rebound/guarding. No obvious abdominal masses.  Extremities: No clubbing or cyanosis. Tr edema. Distal pedal pulses are 2+ and equal bilaterally.  Neuro: Does not speak this morning but did follow commands. No facial asymmetry. R sided weakness is present. Psych: Flat affect. Tearful.    Intake/Output Summary (Last 24 hours) at 11/08/12 9811 Last data filed at 11/07/12 1200  Gross per 24 hour  Intake    240 ml  Output      0 ml  Net    240 ml    Inpatient Medications:  . amiodarone  400 mg Oral Daily  . aspirin EC  81 mg Oral Daily  . escitalopram  10 mg Oral Daily  . feeding supplement  237 mL Oral BID BM  . glipiZIDE  5 mg Oral QAC breakfast  . insulin aspart  0-15 Units Subcutaneous TID WC  . levETIRAcetam  1,000 mg Oral BID  . levothyroxine  50 mcg Oral QAC breakfast  . lisinopril  5 mg Oral Daily  . metoprolol tartrate  75 mg Oral BID  . potassium chloride  20 mEq Oral Daily  . QUEtiapine  50 mg Oral Daily  . Rivaroxaban  20 mg Oral Q supper  . sodium chloride  3 mL Intravenous Q12H  . sodium chloride  3 mL Intravenous Q12H  . spironolactone  25 mg Oral  Daily    Labs:  Recent Labs  11/05/12 1607 11/06/12 0816 11/06/12 1559 11/07/12 0325  NA 141 142  --  141  K 4.0 3.6  --  3.5  CL 107 104  --  107  CO2 26 25  --  26  GLUCOSE 96 123*  --  89  BUN 16 15  --  13  CREATININE 1.0 0.78  --  0.83  CALCIUM 9.3 9.4  --  8.7  MG 1.9  --  1.7  --     Recent Labs  11/06/12 1559  AST 20  ALT 10  ALKPHOS 63  BILITOT 1.3*  PROT 7.6  ALBUMIN 3.2*    Recent Labs  11/06/12 0816 11/07/12 0832  WBC 5.0 6.1  NEUTROABS 3.4  --   HGB 10.3* 9.3*  HCT 33.5* 30.4*  MCV 88.2 89.1  PLT 255 228    Recent Labs  11/06/12 1559 11/06/12 1925 11/07/12 0325  TROPONINI <0.30 <0.30 <0.30    Recent Labs  11/06/12 1559  HGBA1C 6.3*    Radiology/Studies:  Dg Chest 2 View2/19/2014  *RADIOLOGY REPORT*  Clinical Data: Chest pain, history of diabetes  CHEST - 2 VIEW  Comparison: Chest x-ray of 09/14/2012  Findings: Moderate cardiomegaly is stable.  There does appear to be pulmonary  vascular congestion present with small effusions, suggesting mild congestive heart failure.  An AICD lead is noted. The bones are osteopenic.  IMPRESSION: Probable mild CHF with small effusions and pulmonary vascular congestion.   Original Report Authenticated By: Dwyane Dee, M.D.    Ct Head Wo Contrast2/19/2014  *RADIOLOGY REPORT*  Clinical Data: headache, chest pain.  CT HEAD WITHOUT CONTRAST  Technique:  Contiguous axial images were obtained from the base of the skull through the vertex without contrast.  Comparison: 08/27/2012  Findings: Large old left MCA infarct with encephalomalacia, stable. No acute infarction or hemorrhage.  No hydrocephalus.  No acute calvarial abnormality. Visualized paranasal sinuses and mastoids clear.  Orbital soft tissues unremarkable.  IMPRESSION: Old large left MCA infarct.  No acute intracranial abnormality.   Original Report Authenticated By: Charlett Nose, M.D.      Assessment and Plan  1. Headache - per IM 2. Chest pain, with  borderline POC troponin (no known CAD by record) and negative subsequent troponins 3. Acute on chronic systolic CHF  4. ICD discharge -  did have appropriate shock 10/26/12 for VF (has h/o VT)  Amiodarone increased. No recurrent arrhythmia. 5. CVA with residual significant aphasia and R sided weakness  6. Anemia  7. H/o PAF, currently NSR. Digoxin discontinued due to hypokalemia and amiodarone interaction. 8. RLE DVT 06/2012 with protein C deficiency and IVC filter, on Xarelto  9. H/o noncompliance - family has had to coax her into meds at home previously per chart 10. HTN, controlled 11. H/o seizure disorder 12. Abnormal TSH - per IM   Phares Zaccone [place], MD 11/08/2012 9:06 AM

## 2012-11-08 NOTE — Progress Notes (Signed)
Discharge review done with patient's son.  He acknowledged understanding of information provided.  Prescriptions given per MD's order.  Patient is stable and discharged home with son.  Elizabeth Love

## 2012-11-12 ENCOUNTER — Telehealth: Payer: Self-pay | Admitting: *Deleted

## 2012-11-12 NOTE — Telephone Encounter (Signed)
Patient refused lab work for [facility] this past weekend and refused. Called to see if there was anything I could do. No answer and unable to leave message

## 2012-11-12 NOTE — Telephone Encounter (Signed)
Please let [facility] know the patient's family has notified us we are no longer the patient's primary care doctor and make record in chart. Cancel the orders for lab work. If patient changes her mind, she will need an appt tor formally reestablish care

## 2012-11-12 NOTE — Telephone Encounter (Signed)
Patient was asleep and I spoke with her son regarding if she was a current patient of [facility]. He verified that she does not not have a PCP at [facility] or [facility]. "I think she goes to [facility]

## 2012-11-13 NOTE — Telephone Encounter (Signed)
I called and informed Darl Pikes(?) that the pt was no longer under [place] care. [facility] stated they would note the account

## 2012-11-15 NOTE — H&P (Signed)
Elizabeth Overlie, Love Physician Addendum Internal Medicine Discharge Summaries Service date: 11/06/2012 12:05 PM  [facility] History and Physical   Elizabeth Love:[id number] DOB: [birth year] DOA: 11/06/2012   Referring physician: Celene Kras, Love   PCP: Elizabeth Love, Elizabeth Dach, Love    Chief Complaint: chest pain     HPI:   57 year old female presents to [facility] complaining of headache, chest pain and felt her defibrillator fire today. Interrogation did not reveal any shocks today but did show an episode of VF terminated appropriately by ICD on 10/26/12. History is extremely limited by her aphasia. At some times she is able to answer questions with simple words/phrases, but many other times she just says "I don't know." She sometimes will answer yes while shaking her head no. Headache is constant, "throbbing," chronic for many years, ?not worse today. Chest pain is sharp, constant, worse today. In the ER, CXR with probable mild CHF small effusions and pulm vasc congestion. Hgb 10.3 (c/w prior), POC troponin 0.12, pBNP 12374 (BNP was 1906 yesterday). 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    She also had a stroke that has left her with residual right-sided weakness and expressive aphasia. She moved to [place] 2-3 years ago.She was noted to be in AFib with RVR. Chest CT was negative for pulmonary embolism. She did have some evidence of volume overload. Echocardiogram 8/13: Mild LVH, EF 10%, severe LAE, mild RVE, moderate TR, PASP 39, small pericardial effusion. She has had significant problems with behavioral issues likely related to her stroke. She was not felt to be a long-term anticoagulation candidate at that time, butin 06/2012 was found to have a right lower extremity DVT, protein C deficiency (seen by heme), and placed on Xarelto. She was then admitted twice in 12/13. She was seen for abdominal pain/UTI, and was seen by cardiology because of VT & subsequent ICD shock x5  in the setting of hypokalemia - spironolactone was added. She was hospitalized in 08/2012 with a/c systolic CHF with positive enzymes felt related to strain. There were significant issues during her hospitalization trying to get her to take her medications       Most Recent Cardiac Studies:   2D echo 04/2012 Study Conclusions - Left ventricle: The cavity size was severely dilated. Wall thickness was increased in a pattern of mild LVH. The estimated ejection fraction was 10%. Diffuse hypokinesis. - Mitral valve: Severe regurgitation. - Left atrium: The atrium was severely dilated. - Right ventricle: The cavity size was mildly dilated. - Right atrium: The atrium was mildly dilated. - Tricuspid valve: Moderate regurgitation. - Pulmonary arteries: PA peak pressure: 39mm Hg (S). - Pericardium, extracardiac: A small, free-flowing pericardial effusion was identified. The fluid had no internal echoes.There was no evidence of hemodynamic compromise.               Review of Systems: negative for the following  Constitutional: Denies fever, chills, diaphoresis, appetite change and fatigue.   HEENT: Denies photophobia, eye pain, redness, hearing loss, ear pain, congestion, sore throat, rhinorrhea, sneezing, mouth sores, trouble swallowing, neck pain, neck stiffness and tinnitus.   Respiratory: Denies SOB, DOE, cough, chest tightness, and wheezing.   Cardiovascular: Denies chest pain, palpitations and leg swelling.   Gastrointestinal: Denies nausea, vomiting, abdominal pain, diarrhea, constipation, blood in stool and abdominal distention.   Genitourinary: Denies dysuria, urgency, frequency, hematuria, flank pain and difficulty urinating.   Musculoskeletal: Denies myalgias, back pain, joint swelling, arthralgias and  gait problem.   Skin: Denies pallor, rash and wound.   Neurological: Denies dizziness, seizures, syncope, weakness, light-headedness, numbness and headaches.   Hematological:  Denies adenopathy. Easy bruising, personal or family bleeding history   Psychiatric/Behavioral: Denies suicidal ideation, mood changes, confusion, nervousness, sleep disturbance and agitation             Past Medical History   Diagnosis  Date   .  Seizures     .  Hypertension     .  Stroke         2007 - R sided weakness and expressive aphasia with h/o behavioral problems related to this   .  Asthma     .  Diabetes mellitus     .  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  Chronic systolic CHF (congestive heart failure)         a. s/p AICD ~2007 ([employer]) - previous care [place]. No known hx CAD. b. EF 10% by echo 04/2012.   Elizabeth Love  Atrial fibrillation         04/2012 - not felt to be an anticoag candidate long term at that time, but later had DVT so was on placed on Xarelto   .  Coagulopathy         "Protein C deficiency"   .  History of DVT (deep vein thrombosis)         a. 06/2012: RLE DVT, + protein C deficiency, placed on Xarelto. s/p IVC filter   .  Noncompliance         Due to mental status, family has had to coax her to take medicines   .  Hypothyroidism     .  VT (ventricular tachycardia)         a. h/o ICD ~2007-2008. b. Adm 08/2012 with UTI, had VT/ICD shock x 5 (hypokalemic)   .  Protein C deficiency           Past Surgical History   Procedure  Laterality  Date   .  Cholecystectomy       .  Appendectomy       .  Pacemaker insertion       .  Vena cava filter placement    07/19/2012       Procedure: INSERTION VENA-CAVA FILTER;  Surgeon: Larina Earthly, Love;  Location: [facility] OR;  Service: Vascular;  Laterality: [name];   .  Insert / replace / remove pacemaker           ICD   .  Abdominal hysterectomy              Social History: reports that she has quit smoking. She does not have any smokeless tobacco history on file. She reports that she does not drink alcohol or use illicit drugs.        Allergies   Allergen  Reactions   .  Penicillins  Rash   .   Sulfa Antibiotics  Rash       Family History   Problem  Relation  Age of Onset   .  Coronary artery disease  Neg Hx         Prior to Admission medications    Medication  Sig  Start Date  End Date  Taking?  Authorizing Provider   albuterol (PROVENTIL) (2.5 MG/3ML) 0.083% nebulizer solution  Take 2.5 mg by nebulization every 6 (six) hours as needed.  For cough or wheeze        Historical Provider, Love   ALPRAZolam Prudy Feeler) 0.5 MG tablet  Take 0.5 mg by mouth at bedtime as needed. For sleep        Historical Provider, Love   amiodarone (PACERONE) 200 MG tablet  Take 200 mg by mouth daily.        Historical Provider, Love   aspirin 81 MG EC tablet  Take 1 tablet (81 mg total) by mouth daily. Swallow whole.  09/19/12      Roger A Arguello, PA-C   digoxin (LANOXIN) 0.125 MG tablet  Take 1 tablet (0.125 mg total) by mouth daily.  08/29/12      Penny Pia, Love   escitalopram (LEXAPRO) 10 MG tablet  Take 10 mg by mouth daily.        Historical Provider, Love   feeding supplement (ENSURE COMPLETE) LIQD  Take 237 mLs by mouth 2 (two) times daily between meals.  09/19/12      Roger A Arguello, PA-C   furosemide (LASIX) 20 MG tablet  Take 1 tablet (20 mg total) by mouth as needed (For sudden weight gain, swelling or shortness of breath).  09/19/12      Roger A Arguello, PA-C   glipiZIDE (GLUCOTROL XL) 5 MG 24 hr tablet  Take 1 tablet (5 mg total) by mouth daily.  07/22/12      Catarina Hartshorn, Love   levETIRAcetam (KEPPRA) 500 MG tablet  Take 1,000 mg by mouth 2 (two) times daily.        Historical Provider, Love   levothyroxine (SYNTHROID, LEVOTHROID) 50 MCG tablet  Take 50 mcg by mouth daily.        Historical Provider, Love   lisinopril (PRINIVIL,ZESTRIL) 5 MG tablet  Take 5 mg by mouth daily.        Historical Provider, Love   metoprolol tartrate (LOPRESSOR) 25 MG tablet  Take 3 tablets (75 mg total) by mouth 2 (two) times daily.  08/29/12      Penny Pia, Love   omeprazole (PRILOSEC) 20 MG capsule  Take 20 mg by mouth 2 (two)  times daily.        Historical Provider, Love   oxyCODONE-acetaminophen (PERCOCET) 10-325 MG per tablet  Take 1 tablet by mouth 3 (three) times daily. Scheduled per pharmacy        Historical Provider, Love   QUEtiapine (SEROQUEL XR) 50 MG TB24  Take 50 mg by mouth daily.        Historical Provider, Love   Rivaroxaban (XARELTO) 20 MG TABS  Take 1 tablet (20 mg total) by mouth daily with supper.  08/29/12      Penny Pia, Love   spironolactone (ALDACTONE) 25 MG tablet  Take 1 tablet (25 mg total) by mouth daily.  08/29/12      Penny Pia, Love          Physical Exam: Filed Vitals:     11/06/12 1015  11/06/12 1045  11/06/12 1100  11/06/12 1130   BP:  147/91  156/91  153/87  152/96   Pulse:  75  83  80  79   Temp:           TempSrc:           Resp:  20  21  20  16    SpO2:  97%  96%  94%  97%      General: Well developed chronically ill AAF in no  acute distress.   Head: Normocephalic, atraumatic, sclera non-icteric, no xanthomas, nares are without discharge.   Neck: Negative for carotid bruits.   Lungs: Coarse anteriorly without obvious wheezes, rales or rhonchi. Breathing is unlabored.   Heart: RRR with S1 S2. No murmurs, rubs, or gallops appreciated.   Abdomen: Soft, non-tender, non-distended with normoactive bowel sounds. No hepatomegaly. No rebound/guarding. No obvious abdominal masses.   Msk: Did not cooperate with MSK exam on R side, but L strength in tact.   Extremities: No clubbing or cyanosis. No edema. Distal pedal pulses are 2+ and equal bilaterally.   Neuro: Alert and oriented to self, not place/situation/date. No facial asymmetry. Slow to speak, expressive aphasia noted. R sided weaknes.   Psych: Flat affect.              Labs on Admission:      Basic Metabolic Panel: Recent Labs Lab  11/05/12 1607  11/06/12 0816   NA  141  142   K  4.0  3.6   CL  107  104   CO2  26  25   GLUCOSE  96  123*   BUN  16  15   CREATININE  1.0  0.78   CALCIUM  9.3  9.4    MG  1.9   --     Liver Function Tests: No results found for this basename: AST, ALT, ALKPHOS, BILITOT, PROT, ALBUMIN,  in the last 168 hours No results found for this basename: LIPASE, AMYLASE,  in the last 168 hours No results found for this basename: AMMONIA,  in the last 168 hours CBC: Recent Labs Lab  11/06/12 0816   WBC  5.0   NEUTROABS  3.4   HGB  10.3*   HCT  33.5*   MCV  88.2   PLT  255    Cardiac Enzymes: No results found for this basename: CKTOTAL, CKMB, CKMBINDEX, TROPONINI,  in the last 168 hours   BNP (last 3 results) Recent Labs   09/14/12 0401  11/05/12 1607  11/06/12 0825   PROBNP  15271.0*  1906.0*  12374.0*          CBG: No results found for this basename: GLUCAP,  in the last 168 hours   Radiological Exams on Admission: Dg Chest 2 View   11/06/2012  *RADIOLOGY REPORT*  Clinical Data: Chest pain, history of diabetes  CHEST - 2 VIEW  Comparison: Chest x-ray of 09/14/2012  Findings: Moderate cardiomegaly is stable.  There does appear to be pulmonary vascular congestion present with small effusions, suggesting mild congestive heart failure.  An AICD lead is noted. The bones are osteopenic.  IMPRESSION: Probable mild CHF with small effusions and pulmonary vascular congestion.   Original Report Authenticated By: Dwyane Dee, M.D.     Ct Head Wo Contrast   11/06/2012  *RADIOLOGY REPORT*  Clinical Data: headache, chest pain.  CT HEAD WITHOUT CONTRAST  Technique:  Contiguous axial images were obtained from the base of the skull through the vertex without contrast.  Comparison: 08/27/2012  Findings: Large old left MCA infarct with encephalomalacia, stable. No acute infarction or hemorrhage.  No hydrocephalus.  No acute calvarial abnormality. Visualized paranasal sinuses and mastoids clear.  Orbital soft tissues unremarkable.  IMPRESSION: Old large left MCA infarct.  No acute intracranial abnormality.   Original Report Authenticated By: Charlett Nose, M.D.       EKG: Independently reviewed. Date: 11/06/2012   Rate: 76   Rhythm: normal sinus rhythm  QRS Axis: normal   Intervals: PR prolonged   ST/T Wave abnormalities: nonspecific ST/T changes   Conduction Disutrbances:first-degree A-V block and nonspecific intraventricular conduction delay   Narrative Interpretation:  Old EKG Reviewed: unchanged    Assessment/Plan Principal Problem:   Chest pain at rest Active Problems:   Chronic systolic heart failure   Atrial fibrillation   Hypothyroidism   Coagulopathy   Cardiomyopathy   Seizure disorder    Chest pain with abnormal troponin in the setting of ischemic cardiomyopathy, cardiology consulted, follow troponin, mild elevation in troponin could be secondary to chronic systolic heart failure. Continue aspirin, beta blocker AICD per cardiology the ICD did not fire today, last shock was 10/26/12 for ventricular fibrillation CVA with residue right-sided weakness and if easier, CT scan negative for acute CVA,cannot do MRI because of AICD History of paroxysmal atrial fibrillation continue digoxin and metoprolol and amiodarone History of right DVT, status post IVC filter on xarelto History of seizure disorder on Keppra Diabetes mellitus type 2, continue glipizide, sliding scale insulin, check hemoglobin A1c   Code Status:   full Family Communication: bedside Disposition Plan: admit    Time spent: 70 mins     [facility] Pager [phone]   If 7PM-7AM, please contact night-coverage [email or web address] Password [facility] 11/06/2012, 12:08 PM

## 2012-11-25 ENCOUNTER — Telehealth: Payer: Self-pay | Admitting: Physician Assistant

## 2012-11-25 NOTE — Telephone Encounter (Signed)
Records Rec From [facility] gave to  [facility] 11/25/12/KM

## 2012-11-27 ENCOUNTER — Emergency Department (HOSPITAL_COMMUNITY): Payer: Medicare Other

## 2012-11-27 ENCOUNTER — Encounter (HOSPITAL_COMMUNITY): Payer: Self-pay | Admitting: Internal Medicine

## 2012-11-27 ENCOUNTER — Inpatient Hospital Stay (HOSPITAL_COMMUNITY)
Admission: EM | Admit: 2012-11-27 | Discharge: 2012-12-04 | DRG: 313 | Disposition: A | Discharge status: Home / Self Care | Payer: Medicare Other | Attending: Internal Medicine | Admitting: Internal Medicine

## 2012-11-27 DIAGNOSIS — I5023 Acute on chronic systolic (congestive) heart failure: Secondary | ICD-10-CM | POA: Diagnosis present

## 2012-11-27 DIAGNOSIS — I428 Other cardiomyopathies: Secondary | ICD-10-CM | POA: Diagnosis present

## 2012-11-27 DIAGNOSIS — M79609 Pain in unspecified limb: Secondary | ICD-10-CM | POA: Diagnosis present

## 2012-11-27 DIAGNOSIS — R519 Headache, unspecified: Secondary | ICD-10-CM | POA: Diagnosis present

## 2012-11-27 DIAGNOSIS — R5381 Other malaise: Secondary | ICD-10-CM | POA: Diagnosis present

## 2012-11-27 DIAGNOSIS — I509 Heart failure, unspecified: Secondary | ICD-10-CM | POA: Diagnosis present

## 2012-11-27 DIAGNOSIS — R29810 Facial weakness: Secondary | ICD-10-CM | POA: Diagnosis present

## 2012-11-27 DIAGNOSIS — E54 Ascorbic acid deficiency: Secondary | ICD-10-CM | POA: Diagnosis present

## 2012-11-27 DIAGNOSIS — R51 Headache: Secondary | ICD-10-CM | POA: Diagnosis present

## 2012-11-27 DIAGNOSIS — R0789 Other chest pain: Principal | ICD-10-CM | POA: Diagnosis present

## 2012-11-27 DIAGNOSIS — I2510 Atherosclerotic heart disease of native coronary artery without angina pectoris: Secondary | ICD-10-CM | POA: Diagnosis present

## 2012-11-27 DIAGNOSIS — E876 Hypokalemia: Secondary | ICD-10-CM | POA: Diagnosis present

## 2012-11-27 DIAGNOSIS — Z8673 Personal history of transient ischemic attack (TIA), and cerebral infarction without residual deficits: Secondary | ICD-10-CM

## 2012-11-27 DIAGNOSIS — I4891 Unspecified atrial fibrillation: Secondary | ICD-10-CM | POA: Diagnosis present

## 2012-11-27 LAB — CBC WITH DIFFERENTIAL/PLATELET
Basophils Absolute: 0 10*3/uL (ref 0.0–0.1)
HCT: 29.7 % — ABNORMAL LOW (ref 36.0–46.0)
Hemoglobin: 9.5 g/dL — ABNORMAL LOW (ref 12.0–15.0)
Lymphocytes Relative: 18 % (ref 12–46)
Monocytes Absolute: 0.5 10*3/uL (ref 0.1–1.0)
Monocytes Relative: 7 % (ref 3–12)
Neutro Abs: 5 10*3/uL (ref 1.7–7.7)
RDW: 16.8 % — ABNORMAL HIGH (ref 11.5–15.5)
WBC: 6.9 10*3/uL (ref 4.0–10.5)

## 2012-11-27 LAB — URINE MICROSCOPIC-ADD ON

## 2012-11-27 LAB — COMPREHENSIVE METABOLIC PANEL
AST: 22 U/L (ref 0–37)
Alkaline Phosphatase: 67 U/L (ref 39–117)
CO2: 24 mEq/L (ref 19–32)
Chloride: 103 mEq/L (ref 96–112)
Creatinine, Ser: 0.79 mg/dL (ref 0.50–1.10)
GFR calc non Af Amer: >90 mL/min (ref >90)
Total Bilirubin: 1.7 mg/dL — ABNORMAL HIGH (ref 0.3–1.2)

## 2012-11-27 LAB — POCT I-STAT TROPONIN I: Troponin i, poc: 0.1 ng/mL (ref 0.00–0.08)

## 2012-11-27 LAB — APTT: aPTT: 58 seconds — ABNORMAL HIGH (ref 24–37)

## 2012-11-27 LAB — URINALYSIS, ROUTINE W REFLEX MICROSCOPIC
Glucose, UA: NEGATIVE mg/dL
Hgb urine dipstick: NEGATIVE
Ketones, ur: NEGATIVE mg/dL
Protein, ur: 30 mg/dL — AB

## 2012-11-27 MED ORDER — MORPHINE SULFATE 4 MG/ML IJ SOLN
4.0000 mg | Freq: Once | INTRAMUSCULAR | Status: AC
  Administered 2012-11-27: 4 mg via INTRAVENOUS
  Filled 2012-11-27 (×2): qty 1

## 2012-11-27 MED ORDER — MORPHINE SULFATE 4 MG/ML IJ SOLN
4.0000 mg | Freq: Once | INTRAMUSCULAR | Status: AC
  Administered 2012-11-27: 4 mg via INTRAVENOUS
  Filled 2012-11-27: qty 1

## 2012-11-27 NOTE — H&P (Signed)
PCP:  [name], [name], MD  Dr. Antoine Poche with [facility]  Chief Complaint:   Headache, leg pain  HPI: Elizabeth Love is a 57 y.o. female   has a past medical history of Seizures; Hypertension; Stroke; Asthma; Diabetes mellitus; Renal disorder; Chronic systolic CHF (congestive heart failure); Atrial fibrillation; Coagulopathy; History of DVT (deep vein thrombosis); Noncompliance; Hypothyroidism; VT (ventricular tachycardia); and Protein C deficiency.   Presented with  Hx of headache for long time and leg swelling. She is not able to provide any specifics. Note patient has a very complex medical history she has history of nonischemic cardiomyopathy with EF of 10% status post AICD placement in 2007 in [place]. 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 I'm not quite clear what her baseline mental status is, no family currently at bedside she was brought in by ambulance. She also has a history of atrial fibrillation for thought to be initially noted good candidate for anticoagulation due to noncompliance. But in November 2003 she developed DVT with subsequent pulmonary embolism and was found to have protein C deficiency. She had an IVC filter placement done and was started on Xarelto which is still taking.  Of note she has long-standing history of headaches which has been worked up in the past apparently the current CT scan is unremarkable.  Chest history of chronic pain and also has trouble verbalizing her symptoms which makes her history taking very difficult. While in ER she has been pointing to her chest occasionally stating that she has some chest pain her troponin was obtained which was slightly elevated at 0.1. Her EKG is unchanged. The past she had had positive troponins which were thought to be secondary to cardiac strain due to CHF.  Of note her INR was also checked  although she's not a Coumadin it was shown to be greater than 10. I have spoken to pharmacy regarding this state that INR is not an accurate test in monitoring of Xarelto and is routinely severely elevated nonetheless they will followup all patient is here. Patient is complaining of leg pain but her legs do not appear to be swollen. In fact she does not showing signs of fluid overload.   Review of Systems:    Pertinent positives include: occasional diarrhea, chest pain headaches,   Constitutional:  No weight loss, night sweats, Fevers, chills, fatigue, weight loss  HEENT:  NoDifficulty swallowing,Tooth/dental problems,Sore throat,  No sneezing, itching, ear ache, nasal congestion, post nasal drip,  Cardio-vascular:  No  Orthopnea, PND, anasarca, dizziness, palpitations.no  GI:  No heartburn, indigestion, abdominal pain, nausea, vomiting, diarrhea, change in bowel habits, loss of appetite, melena, blood in stool, hematemesis Resp:  no shortness of breath at rest. No dyspnea on exertion, No excess mucus, no productive cough, No non-productive cough, No coughing up of blood.No change in color of mucus.No wheezing. Skin:  no rash or lesions. No jaundice GU:  no dysuria, change in color of urine, no urgency or frequency. No straining to urinate.  No flank pain.  Musculoskeletal:  No joint pain or no joint swelling. No decreased range of motion. No back pain.  Psych:  No change in mood or affect. No depression or anxiety. No memory loss.  Neuro: no localizing neurological complaints, no tingling, no weakness, no double vision, no gait abnormality, no slurred speech, no confusion  Otherwise ROS are negative except for above, 10  systems were reviewed  Past Medical History: Past Medical History  Diagnosis Date  . Seizures   . Hypertension   . Stroke     2007 - R sided weakness and expressive aphasia with h/o behavioral problems related to this  . Asthma   . Diabetes mellitus   . 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 Chronic systolic CHF (congestive heart failure)     a. s/p AICD ~2007 ([employer]) - previous care [place]. No known hx CAD. b. EF 10% by echo 04/2012.  Marland Kitchen Atrial fibrillation     04/2012 - not felt to be an anticoag candidate long term at that time, but later had DVT so was on placed on Xarelto  . Coagulopathy     "Protein C deficiency"  . History of DVT (deep vein thrombosis)     a. 06/2012: RLE DVT, + protein C deficiency, placed on Xarelto. s/p IVC filter  . Noncompliance     Due to mental status, family has had to coax her to take medicines  . Hypothyroidism   . VT (ventricular tachycardia)     a. h/o ICD ~2007-2008. b. Adm 08/2012 with UTI, had VT/ICD shock x 5 (hypokalemic)  . Protein C deficiency    Past Surgical History  Procedure Laterality Date  . Cholecystectomy    . Appendectomy    . Pacemaker insertion    . Vena cava filter placement  07/19/2012    Procedure: INSERTION VENA-CAVA FILTER;  Surgeon: Larina Earthly, MD;  Location: [facility] OR;  Service: Vascular;  Laterality: [name];  . Insert / replace / remove pacemaker      ICD  . Abdominal hysterectomy       Medications: Prior to Admission medications   Medication Sig Start Date End Date Taking? Authorizing Provider  albuterol (PROVENTIL) (2.5 MG/3ML) 0.083% nebulizer solution Take 2.5 mg by nebulization every 6 (six) hours as needed. For cough or wheeze   Yes Historical Provider, MD  ALPRAZolam (XANAX) 0.5 MG tablet Take 0.5 mg by mouth at bedtime as needed. For sleep   Yes Historical Provider, MD  amiodarone (PACERONE) 400 MG tablet Take 1 tablet (400 mg total) by mouth daily. 11/08/12  Yes Kathlen Mody, MD  aspirin 81 MG EC tablet Take 1 tablet (81 mg total) by mouth daily. Swallow whole. 09/19/12  Yes Roger A Arguello, PA-C  escitalopram (LEXAPRO) 10 MG tablet Take 10 mg by mouth daily.   Yes Historical Provider, MD  feeding supplement (ENSURE COMPLETE) LIQD Take 237 mLs by mouth 2 (two)  times daily between meals. 09/19/12  Yes Roger A Arguello, PA-C  furosemide (LASIX) 20 MG tablet Take 1 tablet (20 mg total) by mouth as needed (For sudden weight gain, swelling or shortness of breath). 09/19/12  Yes Roger A Arguello, PA-C  glipiZIDE (GLUCOTROL XL) 5 MG 24 hr tablet Take 1 tablet (5 mg total) by mouth daily. 07/22/12  Yes Catarina Hartshorn, MD  levETIRAcetam (KEPPRA) 500 MG tablet Take 1,000 mg by mouth 2 (two) times daily.   Yes Historical Provider, MD  levothyroxine (SYNTHROID, LEVOTHROID) 50 MCG tablet Take 50 mcg by mouth daily.   Yes Historical Provider, MD  lisinopril (PRINIVIL,ZESTRIL) 5 MG tablet Take 5 mg by mouth daily.   Yes Historical Provider, MD  metoprolol tartrate (LOPRESSOR) 25 MG tablet Take 25 mg by mouth 2 (two) times daily.   Yes Historical Provider, MD  omeprazole (PRILOSEC) 20 MG capsule Take 20 mg  by mouth 2 (two) times daily.   Yes Historical Provider, MD  oxyCODONE-acetaminophen (PERCOCET) 10-325 MG per tablet Take 1 tablet by mouth 3 (three) times daily. Scheduled per pharmacy   Yes Historical Provider, MD  QUEtiapine (SEROQUEL XR) 50 MG TB24 Take 50 mg by mouth daily.   Yes Historical Provider, MD  Rivaroxaban (XARELTO) 20 MG TABS Take 1 tablet (20 mg total) by mouth daily with supper. 08/29/12  Yes Penny Pia, MD  spironolactone (ALDACTONE) 25 MG tablet Take 1 tablet (25 mg total) by mouth daily. 08/29/12  Yes Penny Pia, MD    Allergies:   Allergies  Allergen Reactions  . Penicillins Rash  . Sulfa Antibiotics Rash    Social History:  Lives at home   reports that she has quit smoking. She does not have any smokeless tobacco history on file. She reports that she does not drink alcohol or use illicit drugs.   Family History: family history includes Hypertension in her mother.  There is no history of Coronary artery disease.    Physical Exam: Patient Vitals for the past 24 hrs:  BP Temp Temp src Pulse Resp SpO2  11/27/12 2230 113/69 mmHg - - 68 14  100 %  11/27/12 2200 132/98 mmHg - - 87 33 92 %  11/27/12 2145 113/68 mmHg - - 64 20 100 %  11/27/12 2130 123/73 mmHg - - 63 19 100 %  11/27/12 2115 117/73 mmHg - - 69 20 100 %  11/27/12 2045 112/82 mmHg - - 75 15 100 %  11/27/12 1959 118/77 mmHg - - 68 16 100 %  11/27/12 1948 124/82 mmHg - - 85 19 100 %  11/27/12 1815 106/54 mmHg - - 60 26 100 %  11/27/12 1800 112/71 mmHg - - 68 16 100 %  11/27/12 1745 112/71 mmHg - - 64 19 100 %  11/27/12 1730 111/77 mmHg - - 68 15 100 %  11/27/12 1655 113/79 mmHg 98.2 F (36.8 C) Oral 69 16 100 %  11/27/12 1254 122/75 mmHg 98.2 F (36.8 C) Oral 71 16 98 %  11/27/12 1249 - - - - - 99 %    1. General:  in No Acute distress 2. Psychological: Alert but not oriented 3. Head/ENT:   Moist   Mucous Membranes                          Head Non traumatic, neck supple                          Normal  Dentition 4. SKIN: normal  Skin turgor,  Skin clean Dry and intact no rash 5. Heart: Regular rate and rhythm no Murmur, Rub or gallop 6. Lungs: Clear to auscultation bilaterally, no wheezes or crackles   7. Abdomen: Soft, non-tender, Non distended 8. Lower extremities: no clubbing, cyanosis, or edema 9. Neurologically  diminished strength on the right upper lobe extremities  10. MSK: Normal range of motion  body mass index is unknown because there is no weight on file.   Labs on Admission:   Recent Labs  11/27/12 1815  NA 140  K 3.4*  CL 103  CO2 24  GLUCOSE 87  BUN 18  CREATININE 0.79  CALCIUM 9.4    Recent Labs  11/27/12 1815  AST 22  ALT 10  ALKPHOS 67  BILITOT 1.7*  PROT 7.6  ALBUMIN 3.0*   No results  found for this basename: LIPASE, AMYLASE,  in the last 72 hours  Recent Labs  11/27/12 1815  WBC 6.9  NEUTROABS 5.0  HGB 9.5*  HCT 29.7*  MCV 82.5  PLT 266   No results found for this basename: CKTOTAL, CKMB, CKMBINDEX, TROPONINI,  in the last 72 hours No results found for this basename: TSH, T4TOTAL, FREET3, T3FREE,  THYROIDAB,  in the last 72 hours No results found for this basename: VITAMINB12, FOLATE, FERRITIN, TIBC, IRON, RETICCTPCT,  in the last 72 hours Lab Results  Component Value Date   HGBA1C 6.3* 11/06/2012    The CrCl is unknown because both a height and weight (above a minimum accepted value) are required for this calculation. ABG    Component Value Date/Time   PHART 7.415 04/23/2012 0819   HCO3 21.0 04/23/2012 0819   TCO2 22 05/02/2012 1506   ACIDBASEDEF 3.0* 04/23/2012 0819   O2SAT 97.0 04/23/2012 0819     Lab Results  Component Value Date   DDIMER 2.58* 07/18/2012     Other results:  I have pearsonaly reviewed this: ECG REPORT  Rate:72  Rhythm: Partially bundle branch block paced   UA no evidence of infection  BNP 9596  Cultures:    Component Value Date/Time   SDES URINE, CATHETERIZED 08/24/2012 0550   SPECREQUEST NONE 08/24/2012 0550   CULT ESCHERICHIA COLI 08/24/2012 0550   REPTSTATUS 08/27/2012 FINAL 08/24/2012 0550       Radiological Exams on Admission: Dg Chest 1 View  11/27/2012  *RADIOLOGY REPORT*  Clinical Data: Chest pain  CHEST - 1 VIEW  Comparison: Prior chest x-ray 11/06/2012  Findings: Marked enlargement the cardiopericardial silhouettes. Chronic blunting of the right costophrenic angle appears similar to prior.  There is pulmonary vascular congestion which appears similar to slightly increased compared to prior.  A suggestion of bilateral upper lung nodules which not present on the relatively recent prior exam.  Query radiolucent cardiac monitor electrodes. Similar appearance of right middle lobe opacity compared to prior favored to reflect atelectasis versus scarring.  Stable left subclavian approach cardiac rhythm maintenance device.  IMPRESSION:  1.  Similar to slightly increased pulmonary vascular congestion. 2.  Stable marked cardiomegaly 3.  Suggestion of bilateral upper lung pulmonary nodules which were not present on the recent prior exam.  Query radiolucent  cardiac electrodes.   Original Report Authenticated By: Malachy Moan, M.D.    Ct Head Wo Contrast  11/27/2012  *RADIOLOGY REPORT*  Clinical Data: Headache.  Elevated INR.  CT HEAD WITHOUT CONTRAST  Technique:  Contiguous axial images were obtained from the base of the skull through the vertex without contrast.  Comparison: CT head without contrast 02/21/ 2014  Findings: The remote left MCA territory infarct is stable. Expected dilation of the lateral ventricles is evident.  Remote lacunar infarcts of the cerebellum are stable.  Mild white matter disease is unchanged.  No acute to infarct, hemorrhage, mass lesion is present.  The paranasal sinuses and mastoid air cells are clear.  IMPRESSION:  1.  No acute intracranial abnormality or significant interval change. 2.  Remote left MCA territory infarct.   Original Report Authenticated By: Marin Roberts, M.D.     Chart has been reviewed  Assessment/Plan  This is a 57 year old female with history of ischemic cardiomyopathy but currently appears to be euvolemic and well compensated. Presents with atypical symptoms and possible chest pain and slightly elevated troponin.   Present on Admission:  . Chest pain at rest - patient does  not have any evidence of coronary artery disease in the last She has nonischemic cardiomyopathy. She have had positive troponins in the past but will cycle and observe her overnight.  . Protein C deficiency - continue Xarelto, would not use INR to monitor. At this point is no evidence of bleeding.  -  . Hypokalemia - will replace  . Atrial fibrillation - continue home medications  . Cardiomyopathy -  currently appears to be euvolemic continue Lasix  . Headache - this is chronic CT scan unremarkable treat symptomatically.     Prophylaxis: xarelto Protonix  CODE STATUS: CODE STATUS is unclear as I am unable to question the patient regarding this for now will keep her as full code as I have no other indication    Other plan as per orders.  I have spent a total of 65 min on this admission times taken to speak to a pharmacist and other consultants   Alix Stowers 11/27/2012, 10:58 PM

## 2012-11-27 NOTE — ED Notes (Signed)
Pt calm sitting in bed. Pt states she is not in pain

## 2012-11-27 NOTE — ED Notes (Signed)
To ED via [facility] from home with chest wall pain x 3 days-- hx strokes, alert to person/place-- not to time-- normal according to family.

## 2012-11-27 NOTE — ED Provider Notes (Signed)
History     CSN: [id number]  Arrival date & time 11/27/12  1241   First MD Initiated Contact with Patient 11/27/12 1753      No chief complaint on file.  level V caveat to 2 previous stroke and aphasia.  (Consider location/radiation/quality/duration/timing/severity/associated sxs/prior treatment) The history is provided by the patient.   patient came in with port chest pain. She's crying loudly. She also states she's had a headache and swelling in her feet. Recent admission for same.  Past Medical History  Diagnosis Date  . Seizures   . Hypertension   . Stroke     2007 - R sided weakness and expressive aphasia with h/o behavioral problems related to this  . Asthma   . Diabetes mellitus   . 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 Chronic systolic CHF (congestive heart failure)     a. s/p AICD ~2007 ([employer]. [employer]) - previous care [place]. No known hx CAD. b. EF 10% by echo 04/2012.  Marland Kitchen Atrial fibrillation     04/2012 - not felt to be an anticoag candidate long term at that time, but later had DVT so was on placed on Xarelto  . Coagulopathy     "Protein C deficiency"  . History of DVT (deep vein thrombosis)     a. 06/2012: RLE DVT, + protein C deficiency, placed on Xarelto. s/p IVC filter  . Noncompliance     Due to mental status, family has had to coax her to take medicines  . Hypothyroidism   . VT (ventricular tachycardia)     a. h/o ICD ~2007-2008. b. Adm 08/2012 with UTI, had VT/ICD shock x 5 (hypokalemic)  . Protein C deficiency     Past Surgical History  Procedure Laterality Date  . Cholecystectomy    . Appendectomy    . Pacemaker insertion    . Vena cava filter placement  07/19/2012    Procedure: INSERTION VENA-CAVA FILTER;  Surgeon: Larina Earthly, MD;  Location[facility] OR;  Service: Vascular;  Laterality: [name];  . Insert / replace / remove pacemaker      ICD  . Abdominal hysterectomy      Family History  Problem Relation Age of Onset  . Coronary artery disease  Neg Hx     History  Substance Use Topics  . Smoking status: Former Games developer  . Smokeless tobacco: Not on file  . Alcohol Use: No    OB History   Grav Para Term Preterm Abortions TAB SAB Ect Mult Living                  Review of Systems  Unable to perform ROS: Mental status change    Allergies  Penicillins and Sulfa antibiotics  Home Medications   Current Outpatient Rx  Name  Route  Sig  Dispense  Refill  . albuterol (PROVENTIL) (2.5 MG/3ML) 0.083% nebulizer solution   Nebulization   Take 2.5 mg by nebulization every 6 (six) hours as needed. For cough or wheeze         . ALPRAZolam (XANAX) 0.5 MG tablet   Oral   Take 0.5 mg by mouth at bedtime as needed. For sleep         . amiodarone (PACERONE) 400 MG tablet   Oral   Take 1 tablet (400 mg total) by mouth daily.   30 tablet   0   . aspirin 81 MG EC tablet   Oral   Take  1 tablet (81 mg total) by mouth daily. Swallow whole.   30 tablet   12   . escitalopram (LEXAPRO) 10 MG tablet   Oral   Take 10 mg by mouth daily.         . feeding supplement (ENSURE COMPLETE) LIQD   Oral   Take 237 mLs by mouth 2 (two) times daily between meals.         . furosemide (LASIX) 20 MG tablet   Oral   Take 1 tablet (20 mg total) by mouth as needed (For sudden weight gain, swelling or shortness of breath).   30 tablet   3   . glipiZIDE (GLUCOTROL XL) 5 MG 24 hr tablet   Oral   Take 1 tablet (5 mg total) by mouth daily.   30 tablet   1   . levETIRAcetam (KEPPRA) 500 MG tablet   Oral   Take 1,000 mg by mouth 2 (two) times daily.         Marland Kitchen levothyroxine (SYNTHROID, LEVOTHROID) 50 MCG tablet   Oral   Take 50 mcg by mouth daily.         Marland Kitchen lisinopril (PRINIVIL,ZESTRIL) 5 MG tablet   Oral   Take 5 mg by mouth daily.         . metoprolol tartrate (LOPRESSOR) 25 MG tablet   Oral   Take 25 mg by mouth 2 (two) times daily.         Marland Kitchen omeprazole (PRILOSEC) 20 MG capsule   Oral   Take 20 mg by mouth 2  (two) times daily.         Marland Kitchen oxyCODONE-acetaminophen (PERCOCET) 10-325 MG per tablet   Oral   Take 1 tablet by mouth 3 (three) times daily. Scheduled per pharmacy         . QUEtiapine (SEROQUEL XR) 50 MG TB24   Oral   Take 50 mg by mouth daily.         . Rivaroxaban (XARELTO) 20 MG TABS   Oral   Take 1 tablet (20 mg total) by mouth daily with supper.   30 tablet   0   . spironolactone (ALDACTONE) 25 MG tablet   Oral   Take 1 tablet (25 mg total) by mouth daily.   30 tablet   0     BP 132/98  Pulse 87  Temp(Src) 98.2 F (36.8 C) (Oral)  Resp 33  SpO2 92%  Physical Exam  Constitutional: She appears well-developed.  HENT:  Head: Normocephalic.  Eyes: Pupils are equal, round, and reactive to light.  Neck: Normal range of motion.  Cardiovascular:  Tachycardia  Pulmonary/Chest: Effort normal and breath sounds normal.  Abdominal: There is no tenderness.  Musculoskeletal: She exhibits edema.  Painful edema in bilateral lower extremities.  Neurological: She is alert.  Patient is crying loudly appears somewhat confused.  Skin: Skin is warm.    ED Course  Procedures (including critical care time)  Labs Reviewed  CBC WITH DIFFERENTIAL - Abnormal; Notable for the following:    RBC 3.60 (*)    Hemoglobin 9.5 (*)    HCT 29.7 (*)    RDW 16.8 (*)    All other components within normal limits  COMPREHENSIVE METABOLIC PANEL - Abnormal; Notable for the following:    Potassium 3.4 (*)    Albumin 3.0 (*)    Total Bilirubin 1.7 (*)    All other components within normal limits  PRO B NATRIURETIC PEPTIDE - Abnormal; Notable  for the following:    Pro B Natriuretic peptide (BNP) 9596.0 (*)    All other components within normal limits  DIGOXIN LEVEL - Abnormal; Notable for the following:    Digoxin Level <0.3 (*)    All other components within normal limits  PROTIME-INR - Abnormal; Notable for the following:    Prothrombin Time 89.5 (*)    INR >10.00 (*)    All other  components within normal limits  APTT - Abnormal; Notable for the following:    aPTT 58 (*)    All other components within normal limits  POCT I-STAT TROPONIN I - Abnormal; Notable for the following:    Troponin i, poc 0.10 (*)    All other components within normal limits  URINALYSIS, ROUTINE W REFLEX MICROSCOPIC   Dg Chest 1 View  11/27/2012  *RADIOLOGY REPORT*  Clinical Data: Chest pain  CHEST - 1 VIEW  Comparison: Prior chest x-ray 11/06/2012  Findings: Marked enlargement the cardiopericardial silhouettes. Chronic blunting of the right costophrenic angle appears similar to prior.  There is pulmonary vascular congestion which appears similar to slightly increased compared to prior.  A suggestion of bilateral upper lung nodules which not present on the relatively recent prior exam.  Query radiolucent cardiac monitor electrodes. Similar appearance of right middle lobe opacity compared to prior favored to reflect atelectasis versus scarring.  Stable left subclavian approach cardiac rhythm maintenance device.  IMPRESSION:  1.  Similar to slightly increased pulmonary vascular congestion. 2.  Stable marked cardiomegaly 3.  Suggestion of bilateral upper lung pulmonary nodules which were not present on the recent prior exam.  Query radiolucent cardiac electrodes.   Original Report Authenticated By: Malachy Moan, M.D.    Ct Head Wo Contrast  11/27/2012  *RADIOLOGY REPORT*  Clinical Data: Headache.  Elevated INR.  CT HEAD WITHOUT CONTRAST  Technique:  Contiguous axial images were obtained from the base of the skull through the vertex without contrast.  Comparison: CT head without contrast 02/21/ 2014  Findings: The remote left MCA territory infarct is stable. Expected dilation of the lateral ventricles is evident.  Remote lacunar infarcts of the cerebellum are stable.  Mild white matter disease is unchanged.  No acute to infarct, hemorrhage, mass lesion is present.  The paranasal sinuses and mastoid air  cells are clear.  IMPRESSION:  1.  No acute intracranial abnormality or significant interval change. 2.  Remote left MCA territory infarct.   Original Report Authenticated By: Marin Roberts, M.D.      1. Chest pain   2. Warfarin toxicity, initial encounter      Date: 11/27/2012  Rate: 72  Rhythm: normal sinus rhythm  QRS Axis: normal  Intervals: normal  ST/T Wave abnormalities: nonspecific ST/T changes  Conduction Disutrbances:nonspecific intraventricular conduction delay  Narrative Interpretation:   Old EKG Reviewed: unchanged     MDM  Patient presents with chest pain headache peripheral edema and Coumadin toxicity. Troponin is minimally elevated at similar level that has been previously. She'll be admitted to internal medicine.        Juliet Rude. Rubin Payor, MD 11/27/12 2230

## 2012-11-28 ENCOUNTER — Observation Stay (HOSPITAL_COMMUNITY): Payer: Medicare Other

## 2012-11-28 LAB — COMPREHENSIVE METABOLIC PANEL
ALT: 10 U/L (ref 0–35)
AST: 24 U/L (ref 0–37)
Alkaline Phosphatase: 70 U/L (ref 39–117)
CO2: 25 mEq/L (ref 19–32)
Chloride: 103 mEq/L (ref 96–112)
Creatinine, Ser: 0.91 mg/dL (ref 0.50–1.10)
GFR calc non Af Amer: 70 mL/min — ABNORMAL LOW (ref >90)
Potassium: 3.3 mEq/L — ABNORMAL LOW (ref 3.5–5.1)
Total Bilirubin: 1.7 mg/dL — ABNORMAL HIGH (ref 0.3–1.2)

## 2012-11-28 LAB — TSH: TSH: 5.391 u[IU]/mL — ABNORMAL HIGH (ref 0.350–4.500)

## 2012-11-28 LAB — GLUCOSE, CAPILLARY: Glucose-Capillary: 145 mg/dL — ABNORMAL HIGH (ref 70–99)

## 2012-11-28 LAB — TROPONIN I: Troponin I: <0.3 ng/mL (ref <0.30)

## 2012-11-28 LAB — CBC
MCH: 26.1 pg (ref 26.0–34.0)
MCHC: 31.1 g/dL (ref 30.0–36.0)
Platelets: 297 10*3/uL (ref 150–400)
RDW: 16.8 % — ABNORMAL HIGH (ref 11.5–15.5)

## 2012-11-28 LAB — PROTIME-INR: Prothrombin Time: 67.9 seconds — ABNORMAL HIGH (ref 11.6–15.2)

## 2012-11-28 MED ORDER — ACETAMINOPHEN 650 MG RE SUPP: 650.0000 mg | Freq: Four times a day (QID) | RECTAL | Status: DC | PRN

## 2012-11-28 MED ORDER — METOPROLOL TARTRATE 25 MG PO TABS
25.0000 mg | ORAL_TABLET | Freq: Two times a day (BID) | ORAL | Status: DC
  Administered 2012-11-28 – 2012-11-29 (×4): 25 mg via ORAL
  Filled 2012-11-28 (×6): qty 1

## 2012-11-28 MED ORDER — SODIUM CHLORIDE 0.9 % IJ SOLN
3.0000 mL | INTRAMUSCULAR | Status: DC | PRN
  Administered 2012-11-28: 3 mL via INTRAVENOUS

## 2012-11-28 MED ORDER — LEVETIRACETAM 500 MG PO TABS
1000.0000 mg | ORAL_TABLET | Freq: Two times a day (BID) | ORAL | Status: DC
  Administered 2012-11-28 – 2012-11-29 (×4): 1000 mg via ORAL
  Filled 2012-11-28 (×8): qty 2

## 2012-11-28 MED ORDER — ALPRAZOLAM 0.5 MG PO TABS
0.5000 mg | ORAL_TABLET | Freq: Two times a day (BID) | ORAL | Status: DC
  Administered 2012-11-28 – 2012-11-29 (×3): 0.5 mg via ORAL
  Filled 2012-11-28 (×4): qty 1

## 2012-11-28 MED ORDER — VITAMIN K1 10 MG/ML IJ SOLN
10.0000 mg | Freq: Once | INTRAVENOUS | Status: AC
  Administered 2012-11-28: 10 mg via INTRAVENOUS
  Filled 2012-11-28: qty 1

## 2012-11-28 MED ORDER — ACETAMINOPHEN 325 MG PO TABS
650.0000 mg | ORAL_TABLET | Freq: Four times a day (QID) | ORAL | Status: DC | PRN
  Administered 2012-12-02 – 2012-12-03 (×2): 650 mg via ORAL
  Filled 2012-11-28 (×3): qty 2

## 2012-11-28 MED ORDER — FUROSEMIDE 20 MG PO TABS: 20.0000 mg | ORAL_TABLET | ORAL | Status: DC | PRN

## 2012-11-28 MED ORDER — ALPRAZOLAM 0.5 MG PO TABS
0.5000 mg | ORAL_TABLET | Freq: Every evening | ORAL | Status: DC | PRN
  Filled 2012-11-28: qty 1

## 2012-11-28 MED ORDER — QUETIAPINE FUMARATE ER 50 MG PO TB24
50.0000 mg | ORAL_TABLET | Freq: Every day | ORAL | Status: DC
  Administered 2012-11-28 – 2012-11-29 (×2): 50 mg via ORAL
  Filled 2012-11-28 (×4): qty 1

## 2012-11-28 MED ORDER — POTASSIUM CHLORIDE CRYS ER 20 MEQ PO TBCR
40.0000 meq | EXTENDED_RELEASE_TABLET | Freq: Once | ORAL | Status: AC
  Administered 2012-11-28: 40 meq via ORAL
  Filled 2012-11-28: qty 2

## 2012-11-28 MED ORDER — OXYCODONE-ACETAMINOPHEN 10-325 MG PO TABS: 1.0000 | ORAL_TABLET | Freq: Three times a day (TID) | ORAL | Status: DC

## 2012-11-28 MED ORDER — MORPHINE SULFATE 2 MG/ML IJ SOLN
2.0000 mg | Freq: Four times a day (QID) | INTRAMUSCULAR | Status: DC | PRN
  Administered 2012-11-28: 2 mg via INTRAVENOUS
  Administered 2012-11-28: 4 mg via INTRAVENOUS
  Administered 2012-11-30: 2 mg via INTRAVENOUS
  Filled 2012-11-28: qty 1
  Filled 2012-11-28: qty 2
  Filled 2012-11-28: qty 1

## 2012-11-28 MED ORDER — AMIODARONE HCL 200 MG PO TABS
400.0000 mg | ORAL_TABLET | Freq: Every day | ORAL | Status: DC
  Administered 2012-11-28 – 2012-12-04 (×6): 400 mg via ORAL
  Filled 2012-11-28 (×7): qty 2

## 2012-11-28 MED ORDER — PANTOPRAZOLE SODIUM 40 MG PO TBEC
40.0000 mg | DELAYED_RELEASE_TABLET | Freq: Every day | ORAL | Status: DC
  Administered 2012-11-28 – 2012-11-29 (×2): 40 mg via ORAL
  Filled 2012-11-28 (×2): qty 1

## 2012-11-28 MED ORDER — ASPIRIN EC 81 MG PO TBEC
81.0000 mg | DELAYED_RELEASE_TABLET | Freq: Every day | ORAL | Status: DC
  Administered 2012-11-28 – 2012-11-29 (×2): 81 mg via ORAL
  Filled 2012-11-28 (×4): qty 1

## 2012-11-28 MED ORDER — ENSURE COMPLETE PO LIQD
237.0000 mL | Freq: Two times a day (BID) | ORAL | Status: DC
  Administered 2012-11-28 – 2012-11-29 (×4): 237 mL via ORAL

## 2012-11-28 MED ORDER — RIVAROXABAN 20 MG PO TABS
20.0000 mg | ORAL_TABLET | Freq: Every day | ORAL | Status: DC
  Administered 2012-11-28 – 2012-12-03 (×5): 20 mg via ORAL
  Filled 2012-11-28 (×7): qty 1

## 2012-11-28 MED ORDER — ALBUTEROL SULFATE (5 MG/ML) 0.5% IN NEBU: 2.5000 mg | INHALATION_SOLUTION | Freq: Four times a day (QID) | RESPIRATORY_TRACT | Status: DC | PRN

## 2012-11-28 MED ORDER — ESCITALOPRAM OXALATE 10 MG PO TABS
10.0000 mg | ORAL_TABLET | Freq: Every day | ORAL | Status: DC
  Administered 2012-11-28 – 2012-11-29 (×2): 10 mg via ORAL
  Filled 2012-11-28 (×3): qty 1

## 2012-11-28 MED ORDER — COLCHICINE 0.6 MG PO TABS
0.6000 mg | ORAL_TABLET | Freq: Once | ORAL | Status: AC
  Administered 2012-11-28: 0.6 mg via ORAL
  Filled 2012-11-28: qty 1

## 2012-11-28 MED ORDER — ONDANSETRON HCL 4 MG/2ML IJ SOLN
4.0000 mg | Freq: Four times a day (QID) | INTRAMUSCULAR | Status: DC | PRN
  Administered 2012-11-30: 4 mg via INTRAVENOUS
  Filled 2012-11-28: qty 2

## 2012-11-28 MED ORDER — SPIRONOLACTONE 25 MG PO TABS
25.0000 mg | ORAL_TABLET | Freq: Every day | ORAL | Status: DC
  Administered 2012-11-28 – 2012-11-29 (×2): 25 mg via ORAL
  Filled 2012-11-28 (×4): qty 1

## 2012-11-28 MED ORDER — SODIUM CHLORIDE 0.9 % IJ SOLN
3.0000 mL | Freq: Two times a day (BID) | INTRAMUSCULAR | Status: DC
  Administered 2012-11-28 – 2012-11-29 (×3): 3 mL via INTRAVENOUS

## 2012-11-28 MED ORDER — SODIUM CHLORIDE 0.9 % IV SOLN
250.0000 mL | INTRAVENOUS | Status: DC | PRN
  Administered 2012-11-28: 250 mL via INTRAVENOUS

## 2012-11-28 MED ORDER — OXYCODONE HCL 5 MG PO TABS
5.0000 mg | ORAL_TABLET | Freq: Three times a day (TID) | ORAL | Status: DC
  Administered 2012-11-28 – 2012-11-29 (×3): 5 mg via ORAL
  Filled 2012-11-28 (×5): qty 1

## 2012-11-28 MED ORDER — HYDROCODONE-ACETAMINOPHEN 5-325 MG PO TABS
1.0000 | ORAL_TABLET | ORAL | Status: DC | PRN
  Administered 2012-11-28: 1 via ORAL
  Administered 2012-11-28 – 2012-11-29 (×3): 2 via ORAL
  Filled 2012-11-28: qty 1
  Filled 2012-11-28 (×3): qty 2

## 2012-11-28 MED ORDER — OXYCODONE-ACETAMINOPHEN 5-325 MG PO TABS
1.0000 | ORAL_TABLET | Freq: Three times a day (TID) | ORAL | Status: DC
  Administered 2012-11-28 – 2012-11-29 (×3): 1 via ORAL
  Filled 2012-11-28 (×5): qty 1

## 2012-11-28 MED ORDER — LISINOPRIL 5 MG PO TABS
5.0000 mg | ORAL_TABLET | Freq: Every day | ORAL | Status: DC
  Administered 2012-11-28 – 2012-11-29 (×2): 5 mg via ORAL
  Filled 2012-11-28 (×3): qty 1

## 2012-11-28 MED ORDER — ONDANSETRON HCL 4 MG PO TABS: 4.0000 mg | ORAL_TABLET | Freq: Four times a day (QID) | ORAL | Status: DC | PRN

## 2012-11-28 MED ORDER — LEVOTHYROXINE SODIUM 50 MCG PO TABS
50.0000 ug | ORAL_TABLET | Freq: Every day | ORAL | Status: DC
  Administered 2012-11-28 – 2012-11-29 (×2): 50 ug via ORAL
  Filled 2012-11-28 (×5): qty 1

## 2012-11-28 MED ORDER — DOCUSATE SODIUM 100 MG PO CAPS
100.0000 mg | ORAL_CAPSULE | Freq: Two times a day (BID) | ORAL | Status: DC
  Administered 2012-11-28 – 2012-11-29 (×4): 100 mg via ORAL
  Filled 2012-11-28 (×6): qty 1

## 2012-11-28 MED ORDER — SODIUM CHLORIDE 0.9 % IJ SOLN: 3.0000 mL | Freq: Two times a day (BID) | INTRAMUSCULAR | Status: DC

## 2012-11-28 MED ORDER — GLIPIZIDE ER 5 MG PO TB24
5.0000 mg | ORAL_TABLET | Freq: Every day | ORAL | Status: DC
  Administered 2012-11-28 – 2012-11-29 (×2): 5 mg via ORAL
  Filled 2012-11-28 (×4): qty 1

## 2012-11-28 NOTE — Progress Notes (Signed)
Called and spoke with patient's son concerning patient's recent Xarelto refill and patient's high INR.  Son said he has not even opened the new bottle of Xarelto and there is no way patient could have taken more than prescribed.  PharmD & MD notified.  Will continue to monitor. [place], Mitzi Hansen

## 2012-11-28 NOTE — Progress Notes (Addendum)
[facility] PROGRESS NOTE  DANEL STUDZINSKI OZH:[id number] DOB: 19-Apr-1956 DOA: 11/27/2012  PCP: [name], [name], MD  Brief HPI: Elizabeth Love is a 57 y.o. female who has a past medical history of Seizures; Hypertension; Stroke; Asthma; Diabetes mellitus; Renal disorder; Chronic systolic CHF (congestive heart failure); Atrial fibrillation; Coagulopathy; History of DVT (deep vein thrombosis); Noncompliance; Hypothyroidism; VT (ventricular tachycardia); and Protein C deficiency. She presented with Hx of headache for long time and leg swelling. She is not able to provide any specifics. Note patient has a very complex medical history; she has history of nonischemic cardiomyopathy with EF of 10% status post AICD placement in 2007 in [place]. 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 It is not quite clear what her baseline mental status is, no family was available. She was brought in by ambulance. She also has a history of atrial fibrillation. She was not thought to be a good candidate for anticoagulation due to noncompliance. But in November 2013 she developed DVT with subsequent pulmonary embolism and was found to have protein C deficiency. She had an IVC filter placement done and was started on Xarelto which is still taking. Of note she has long-standing history of headaches which has been worked up in the past apparently the current CT scan is unremarkable. Chest history of chronic pain and also has trouble verbalizing her symptoms which makes her history taking very difficult. While in ER she had been pointing to her chest occasionally stating that she has some chest pain her troponin was obtained which was slightly elevated at 0.1. Her EKG is unchanged. The past she had had positive troponins which were thought to be secondary to cardiac strain due to CHF. Of note her INR was also  checked although she's not a Coumadin it was shown to be greater than 10. This was discussed with pharmacist and they stated that INR is not an accurate test in monitoring of Xarelto and is routinely severely elevated nonetheless they will followup all patient is here. Patient is complaining of leg pain but her legs do not appear to be swollen. In fact she does not showing signs of fluid overload.   Past medical history:  Past Medical History  Diagnosis Date  . Seizures   . Hypertension   . Stroke     2007 - R sided weakness and expressive aphasia with h/o behavioral problems related to this  . Asthma   . Diabetes mellitus   . 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 Chronic systolic CHF (congestive heart failure)     a. s/p AICD ~2007 ([employer]) - previous care [facility]. No known hx CAD. b. EF 10% by echo 04/2012.  Marland Kitchen Atrial fibrillation     04/2012 - not felt to be an anticoag candidate long term at that time, but later had DVT so was on placed on Xarelto  . Coagulopathy     "Protein C deficiency"  . History of DVT (deep vein thrombosis)     a. 06/2012: RLE DVT, + protein C deficiency, placed on Xarelto. s/p IVC filter  . Noncompliance     Due to mental status, family has had to coax her to take medicines  . Hypothyroidism   . VT (ventricular tachycardia)     a. h/o ICD ~2007-2008. b. Adm 08/2012 with UTI, had VT/ICD shock x 5 (hypokalemic)  .  Protein C deficiency     Consultants: None  Procedures: None  Antibiotics: None  Subjective: Patient not very clear with explaining her symptoms. She starts crying periodically. She points to her legs and her head. She states she has pain in her right leg but unable to specify further.   Objective: Vital Signs  Filed Vitals:   11/27/12 2350 11/28/12 0030 11/28/12 0236 11/28/12 0545  BP: 132/72 116/78 132/86 111/73  Pulse: 68 72 64 73  Temp:   98.1 F (36.7 C) 98.1 F (36.7 C)  TempSrc:      Resp: 12 21 22 20   SpO2: 100%  100% 94% 93%    Intake/Output Summary (Last 24 hours) at 11/28/12 0901 Last data filed at 11/28/12 0050  Gross per 24 hour  Intake      0 ml  Output    100 ml  Net   -100 ml   There were no vitals filed for this visit.   General appearance: alert, distracted and crying spells Head: Normocephalic, without obvious abnormality, atraumatic Resp: clear to auscultation bilaterally Cardio: S1, S2 normal, no S3 or S4, systolic murmur: early systolic 3/6, blowing throughout the precordium, no click and no rub GI: soft, non-tender; bowel sounds normal; no masses,  no organomegaly Extremities: no obvious lesions noted either LE. Mild swelling of right ankle with tenderness to movement.  Pulses: 2+ and symmetric Skin: Skin color, texture, turgor normal. No rashes or lesions Neurologic: Alert. Not answering orientation questions. 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   Lab Results:  Basic Metabolic Panel:  Recent Labs Lab 11/27/12 1815 11/28/12 0400  NA 140 141  K 3.4* 3.3*  CL 103 103  CO2 24 25  GLUCOSE 87 152*  BUN 18 19  CREATININE 0.79 0.91  CALCIUM 9.4 9.5  MG  --  1.7  PHOS  --  3.0   Liver Function Tests:  Recent Labs Lab 11/27/12 1815 11/28/12 0400  AST 22 24  ALT 10 10  ALKPHOS 67 70  BILITOT 1.7* 1.7*  PROT 7.6 8.2  ALBUMIN 3.0* 3.1*   CBC:  Recent Labs Lab 11/27/12 1815 11/28/12 0400  WBC 6.9 7.3  NEUTROABS 5.0  --   HGB 9.5* 9.7*  HCT 29.7* 31.2*  MCV 82.5 84.1  PLT 266 297   Cardiac Enzymes:  Recent Labs Lab 11/28/12 0400  TROPONINI <0.30   BNP (last 3 results)  Recent Labs  11/05/12 1607 11/06/12 0825 11/27/12 1821  PROBNP 1906.0* 12374.0* 9596.0*   CBG:  Recent Labs Lab 11/27/12 2237 11/28/12 0728  GLUCAP 125* 169*    No results found for this or any previous visit (from the past 240 hour(s)).    Studies/Results: Dg Chest 1 View  11/27/2012  *RADIOLOGY REPORT*  Clinical Data: Chest pain  CHEST - 1 VIEW  Comparison: Prior  chest x-ray 11/06/2012  Findings: Marked enlargement the cardiopericardial silhouettes. Chronic blunting of the right costophrenic angle appears similar to prior.  There is pulmonary vascular congestion which appears similar to slightly increased compared to prior.  A suggestion of bilateral upper lung nodules which not present on the relatively recent prior exam.  Query radiolucent cardiac monitor electrodes. Similar appearance of right middle lobe opacity compared to prior favored to reflect atelectasis versus scarring.  Stable left subclavian approach cardiac rhythm maintenance device.  IMPRESSION:  1.  Similar to slightly increased pulmonary vascular congestion. 2.  Stable marked cardiomegaly 3.  Suggestion of bilateral upper lung pulmonary nodules which were  not present on the recent prior exam.  Query radiolucent cardiac electrodes.   Original Report Authenticated By: Malachy Moan, M.D.    Ct Head Wo Contrast  11/27/2012  *RADIOLOGY REPORT*  Clinical Data: Headache.  Elevated INR.  CT HEAD WITHOUT CONTRAST  Technique:  Contiguous axial images were obtained from the base of the skull through the vertex without contrast.  Comparison: CT head without contrast 02/21/ 2014  Findings: The remote left MCA territory infarct is stable. Expected dilation of the lateral ventricles is evident.  Remote lacunar infarcts of the cerebellum are stable.  Mild white matter disease is unchanged.  No acute to infarct, hemorrhage, mass lesion is present.  The paranasal sinuses and mastoid air cells are clear.  IMPRESSION:  1.  No acute intracranial abnormality or significant interval change. 2.  Remote left MCA territory infarct.   Original Report Authenticated By: Marin Roberts, M.D.     Medications:  Scheduled: . amiodarone  400 mg Oral Daily  . aspirin EC  81 mg Oral Daily  . docusate sodium  100 mg Oral BID  . escitalopram  10 mg Oral Daily  . feeding supplement  237 mL Oral BID BM  . glipiZIDE  5 mg Oral  Q breakfast  . levETIRAcetam  1,000 mg Oral BID  . levothyroxine  50 mcg Oral QAC breakfast  . lisinopril  5 mg Oral Daily  . metoprolol tartrate  25 mg Oral BID  . oxyCODONE-acetaminophen  1 tablet Oral Q8H   And  . oxyCODONE  5 mg Oral Q8H  . pantoprazole  40 mg Oral Daily  . QUEtiapine  50 mg Oral Daily  . Rivaroxaban  20 mg Oral Q supper  . sodium chloride  3 mL Intravenous Q12H  . sodium chloride  3 mL Intravenous Q12H  . spironolactone  25 mg Oral Daily   Continuous:  RUE:AVWUJW chloride, acetaminophen, acetaminophen, albuterol, ALPRAZolam, HYDROcodone-acetaminophen, morphine injection, ondansetron (ZOFRAN) IV, ondansetron, sodium chloride  Assessment/Plan:  Active Problems:   Atrial fibrillation   Cardiomyopathy   Hypokalemia   Protein C deficiency   Acute on chronic systolic CHF (congestive heart failure)   Chest pain at rest   Headache    Chest pain at rest EKG does not show acute ischemic changes. EKG from this morning reviewed as well. Continue to trend troponins. Patient does not have any history of coronary artery disease. She has nonischemic cardiomyopathy with an EF of 10%. She appears to be well compensated currently. Do not anticipate further cardiac testing if she rules out.   Leg pain Evaluate with x rays of right ankle and foot. Oral analgesics. Check uric acid as well.  History of DVT/PE and Protein C deficiency INR was elevated. INR and PT levels are not used for therapeutic monitoring in those on Xarelto. Anti factor xa is preferable. Continue Rivaroxaban, would not use INR to monitor. At this point is no evidence of bleeding. Will however check INR to see if levels decrease. PT/INR abnormalities likely due to underlying condition.  Hypokalemia Being repleted. Mag was ok.  History of Atrial fibrillation Stable. Continue home medications   Non-ischemic Cardiomyopathy  Currently appears to be euvolemic. Cntinue Lasix   Headache This is chronic.  CT scan unremarkable. Treat symptomatically.   Code Status Full Code  DVT Prophylaxis On Rivaroxaban  Family Communication: None at bedside  Disposition Plan: Unclear.    LOS: 1 day   [facility]  [facility] Pager [phone] 11/28/2012, 9:01 AM  If  8PM-8AM, please contact night-coverage at [email or web address], password [email or web address]   Uric acid is significantly elevated. It is possible her ankle/foot pain could be from gout. Her multiple comorbidities create a therapeutic challenge. Will give one dose of colchicine only due to significant interaction with Amiodarone. Avoid NSAIDS due to coagulopathy. Can consider steroids but same limitations.  KRISHNAN,GOKUL 2:06 PM   Discussed elevated PT/INR with Dr. Arline Asp. He suggested that patient could have Vitamin K deficiency but could not up with any other explanation. He stated that it was unlikely Rivaroxaban could be entirely responsible for this. Since Vit K will not interfere with Riavaroxaban treatment will go ahead and give her the same. Will give IV today.  KRISHNAN,GOKUL 5:36 PM

## 2012-11-28 NOTE — Progress Notes (Signed)
At 0551 pt had 10 beats of Vtach. Pt asymptomatic. VSS. Pt has defibrillator. Donnamarie Poag NP notified. No new orders. Cont to monitor.

## 2012-11-28 NOTE — Progress Notes (Signed)
Referral received today for social work consult for pt not taking her Rx's at home. Full assessment to follow.   Sherald Barge, LCSW-A Clinical Social Worker [phone]

## 2012-11-28 NOTE — Progress Notes (Signed)
Pharmacy Consult for Elevated INR with Xarelto   57 yo female with h/o DVT and Protein C deficiency on Xarelto 20 mg daily.    PT/INR 3/12 at  1800 -- 89.5/> 10.00 Rechecked 3/13 at 0100-- 67.9/9.22  At higher levels Xarelto does cause a concentration dependent linear increase in the PT, though this relationship is not as evident at lower (though therapeutic) Xarelto levels.    Patient was seen by hematology 06/2012 for new  DVT, elevated PT/INR (baseline 22.7/2.10) despite no anticoagulation.  They recommended Xarelto at that time.    Patient has a history of noncompliance per past medical history.  I contacted [facility] and they reported her Xarelto prescription has been filled 09/22/12, 10/17/12, and 11/24/12.  Possible reasons for elevated INR:  1.  Patient takes Amiodarone 400 mg daily and there is a documented drug interaction between the two agents  2.  Possible unintentional drug overdose.  The PT did drop from 89.5 to 67.9 in just 7 hours, and will recheck in am for further decrease .  Will contact family in am to bring in medication bottle (just filled 3 days ago).  3.  Possible sensitivity to Rivaroxaban due to intrinsic coagulapathy.  Consider reconsulting hematology for safety/efficacy concerns.   Geannie Risen, PharmD, BCPS  11/28/2012 3:26 AM

## 2012-11-29 ENCOUNTER — Observation Stay (HOSPITAL_COMMUNITY): Payer: Medicare Other

## 2012-11-29 LAB — BASIC METABOLIC PANEL
CO2: 25 mEq/L (ref 19–32)
Chloride: 104 mEq/L (ref 96–112)
Creatinine, Ser: 0.91 mg/dL (ref 0.50–1.10)
GFR calc Af Amer: 81 mL/min — ABNORMAL LOW (ref >90)
Sodium: 142 mEq/L (ref 135–145)

## 2012-11-29 LAB — GLUCOSE, CAPILLARY
Glucose-Capillary: 196 mg/dL — ABNORMAL HIGH (ref 70–99)
Glucose-Capillary: 124 mg/dL — ABNORMAL HIGH (ref 70–99)

## 2012-11-29 LAB — PROTIME-INR
INR: 7.71 (ref 0.00–1.49)
Prothrombin Time: 59.5 seconds — ABNORMAL HIGH (ref 11.6–15.2)

## 2012-11-29 MED ORDER — DEXTROSE 5 % IV SOLN
10.0000 mg | Freq: Once | INTRAVENOUS | Status: AC
  Administered 2012-11-29: 10 mg via INTRAVENOUS
  Filled 2012-11-29: qty 1

## 2012-11-29 NOTE — Progress Notes (Signed)
[facility]  Patient Status: Active (receiving services up to time of hospitalization)  [facility] is providing the following services: RN and HHA  If patient discharges after hours, please call [phone].   Elizabeth Love 11/29/2012, 4:46 PM

## 2012-11-29 NOTE — Consult Note (Signed)
Referring Physician:    Chief Complaint: right side flaccidity.  HPI:                                                                                                                                         Elizabeth Love is an 57 y.o. 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 Rapid response was notified and then I was informed about the situation by the rapid response nurse. Exact time of onset is unclear. Of importance, she had INR 10 upon admission and received vitamin K, but INR 7 today. She underwent emergent CT brain that showed no acute ischemia or hemorrhage.    Date last known well: 11/29/12 Time last known well: unclear. tPA Given: no a candidate due to [facility] and taking xarelto.  Past Medical History  Diagnosis Date  . Seizures   . Hypertension   . Stroke     2007 - R sided weakness and expressive aphasia with h/o behavioral problems related to this  . Asthma   . Diabetes mellitus   . 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 Chronic systolic CHF (congestive heart failure)     a. s/p AICD ~2007 ([employer]) - previous care [facility]. No known hx CAD. b. EF 10% by echo 04/2012.  Marland Kitchen Atrial fibrillation     04/2012 - not felt to be an anticoag candidate long term at that time, but later had DVT so was on placed on Xarelto  . Coagulopathy     "Protein C deficiency"  . History of DVT (deep vein thrombosis)     a. 06/2012: RLE DVT, + protein C deficiency, placed on Xarelto. s/p IVC filter  . Noncompliance     Due to mental status, family has had to coax her to take medicines  . Hypothyroidism   . VT (ventricular tachycardia)     a. h/o ICD ~2007-2008. b. Adm 08/2012 with UTI, had VT/ICD  shock x 5 (hypokalemic)  . Protein C deficiency     Past Surgical History  Procedure Laterality Date  . Cholecystectomy    . Appendectomy    . Pacemaker insertion    . Vena cava filter placement  07/19/2012    Procedure: INSERTION VENA-CAVA FILTER;  Surgeon: Larina Earthly, MD;  Location: [facility] OR;  Service: Vascular;  Laterality: [name];  . Insert / replace / remove pacemaker      ICD  . Abdominal hysterectomy      Family History  Problem Relation Age of Onset  . Coronary artery disease Neg Hx   . Hypertension Mother    Social History:  reports  that she has quit smoking. She does not have any smokeless tobacco history on file. She reports that she does not drink alcohol or use illicit drugs.  Allergies:  Allergies  Allergen Reactions  . Penicillins Rash  . Sulfa Antibiotics Rash    Medications:                                                                                                                           I have reviewed the patient's current medications.  ROS:                                                                                                                                       History obtained from chart review, as patient unable to provide reliable information.   Physical exam: pleasant female in no apparent distress. Blood pressure 92/63, pulse 66, temperature 97.1 F (36.2 C), temperature source Oral, resp. rate 18, SpO2 95.00%. Head: normocephalic. Neck: supple, no bruits, no JVD. Cardiac: no murmurs. Lungs: clear. Abdomen: soft, no tender, no mass. Extremities: mild bilateral edema.   Neurologic Examination: limited due to patient's dysphasia.                                                                                          Mental Status: Alert, awake. Able to follow simple step commands. Expressive dysphasia. Cranial Nerves: II: Discs flat bilaterally; Visual fields grossly normal, pupils equal, round, reactive to light and  accommodation III,IV, VI: ptosis not present, extra-ocular motions intact bilaterally V: examination of facial light touch sensation is unreliable. VII: right face weakness. VIII: hearing normal bilaterally IX,X: gag reflex present XI: bilateral shoulder shrug XII: midline tongue extension Motor: Significant for right arm plegia and dense weakness right lower extremity. Sensory: Pinprick and light touch exam is unreliable. Deep Tendon Reflexes: generalized hyperreflexia. Plantars: Right: upgoing   Left: downgoing Cerebellar: No tested. Gait: no tested. CV: pulses palpable throughout    Results for orders placed during the hospital encounter of 11/27/12 (from the past  48 hour(s))  PROTIME-INR     Status: Abnormal   Collection Time    11/28/12 12:52 AM      Result Value Range   Prothrombin Time 67.9 (*) 11.6 - 15.2 seconds   INR 9.22 (*) 0.00 - 1.49   Comment: CRITICAL RESULT CALLED TO, READ BACK BY AND VERIFIED WITH:     NORTON,C RN 11/28/2012 0155 JORDANS  MAGNESIUM     Status: None   Collection Time    11/28/12  4:00 AM      Result Value Range   Magnesium 1.7  1.5 - 2.5 mg/dL  PHOSPHORUS     Status: None   Collection Time    11/28/12  4:00 AM      Result Value Range   Phosphorus 3.0  2.3 - 4.6 mg/dL  TSH     Status: Abnormal   Collection Time    11/28/12  4:00 AM      Result Value Range   TSH 5.391 (*) 0.350 - 4.500 uIU/mL  COMPREHENSIVE METABOLIC PANEL     Status: Abnormal   Collection Time    11/28/12  4:00 AM      Result Value Range   Sodium 141  135 - 145 mEq/L   Potassium 3.3 (*) 3.5 - 5.1 mEq/L   Chloride 103  96 - 112 mEq/L   CO2 25  19 - 32 mEq/L   Glucose, Bld 152 (*) 70 - 99 mg/dL   BUN 19  6 - 23 mg/dL   Creatinine, Ser 4.78  0.50 - 1.10 mg/dL   Calcium 9.5  8.4 - 29.5 mg/dL   Total Protein 8.2  6.0 - 8.3 g/dL   Albumin 3.1 (*) 3.5 - 5.2 g/dL   AST 24  0 - 37 U/L   ALT 10  0 - 35 U/L   Alkaline Phosphatase 70  39 - 117 U/L   Total Bilirubin 1.7  (*) 0.3 - 1.2 mg/dL   GFR calc non Af Amer 70 (*) >90 mL/min   GFR calc Af Amer 81 (*) >90 mL/min   Comment:            The eGFR has been calculated     using the CKD EPI equation.     This calculation has not been     validated in all clinical     situations.     eGFR's persistently     <90 mL/min signify     possible Chronic Kidney Disease.  CBC     Status: Abnormal   Collection Time    11/28/12  4:00 AM      Result Value Range   WBC 7.3  4.0 - 10.5 K/uL   RBC 3.71 (*) 3.87 - 5.11 MIL/uL   Hemoglobin 9.7 (*) 12.0 - 15.0 g/dL   HCT 62.1 (*) 30.8 - 65.7 %   MCV 84.1  78.0 - 100.0 fL   MCH 26.1  26.0 - 34.0 pg   MCHC 31.1  30.0 - 36.0 g/dL   RDW 84.6 (*) 96.2 - 95.2 %   Platelets 297  150 - 400 K/uL  TROPONIN I     Status: None   Collection Time    11/28/12  4:00 AM      Result Value Range   Troponin I <0.30  <0.30 ng/mL   Comment:            Due to the release kinetics of cTnI,     a negative  result within the first hours     of the onset of symptoms does not rule out     myocardial infarction with certainty.     If myocardial infarction is still suspected,     repeat the test at appropriate intervals.  GLUCOSE, CAPILLARY     Status: Abnormal   Collection Time    11/28/12  7:28 AM      Result Value Range   Glucose-Capillary 169 (*) 70 - 99 mg/dL   Comment 1 Notify RN    TROPONIN I     Status: None   Collection Time    11/28/12 11:15 AM      Result Value Range   Troponin I <0.30  <0.30 ng/mL   Comment:            Due to the release kinetics of cTnI,     a negative result within the first hours     of the onset of symptoms does not rule out     myocardial infarction with certainty.     If myocardial infarction is still suspected,     repeat the test at appropriate intervals.  URIC ACID     Status: Abnormal   Collection Time    11/28/12 11:15 AM      Result Value Range   Uric Acid, Serum 10.6 (*) 2.4 - 7.0 mg/dL  GLUCOSE, CAPILLARY     Status: Abnormal    Collection Time    11/28/12 11:48 AM      Result Value Range   Glucose-Capillary 163 (*) 70 - 99 mg/dL   Comment 1 Notify RN    TROPONIN I     Status: None   Collection Time    11/28/12  3:19 PM      Result Value Range   Troponin I <0.30  <0.30 ng/mL   Comment:            Due to the release kinetics of cTnI,     a negative result within the first hours     of the onset of symptoms does not rule out     myocardial infarction with certainty.     If myocardial infarction is still suspected,     repeat the test at appropriate intervals.  GLUCOSE, CAPILLARY     Status: Abnormal   Collection Time    11/28/12  4:42 PM      Result Value Range   Glucose-Capillary 145 (*) 70 - 99 mg/dL   Comment 1 Notify RN    GLUCOSE, CAPILLARY     Status: Abnormal   Collection Time    11/28/12  8:54 PM      Result Value Range   Glucose-Capillary 130 (*) 70 - 99 mg/dL   Comment 1 Notify RN    PROTIME-INR     Status: Abnormal   Collection Time    11/29/12  4:18 AM      Result Value Range   Prothrombin Time 59.5 (*) 11.6 - 15.2 seconds   INR 7.71 (*) 0.00 - 1.49   Comment: REPEATED TO VERIFY     CRITICAL RESULT CALLED TO, READ BACK BY AND VERIFIED WITH:     [facility] AT 1610 11/29/12 BY K BARR  BASIC METABOLIC PANEL     Status: Abnormal   Collection Time    11/29/12  4:48 AM      Result Value Range   Sodium 142  135 - 145 mEq/L   Potassium 4.2  3.5 - 5.1 mEq/L   Comment: DELTA CHECK NOTED     NO VISIBLE HEMOLYSIS   Chloride 104  96 - 112 mEq/L   CO2 25  19 - 32 mEq/L   Glucose, Bld 126 (*) 70 - 99 mg/dL   BUN 21  6 - 23 mg/dL   Creatinine, Ser 4.09  0.50 - 1.10 mg/dL   Calcium 9.6  8.4 - 81.1 mg/dL   GFR calc non Af Amer 70 (*) >90 mL/min   GFR calc Af Amer 81 (*) >90 mL/min   Comment:            The eGFR has been calculated     using the CKD EPI equation.     This calculation has not been     validated in all clinical     situations.     eGFR's persistently     <90 mL/min signify      possible Chronic Kidney Disease.  GLUCOSE, CAPILLARY     Status: Abnormal   Collection Time    11/29/12  7:38 AM      Result Value Range   Glucose-Capillary 196 (*) 70 - 99 mg/dL   Comment 1 Notify RN    GLUCOSE, CAPILLARY     Status: Abnormal   Collection Time    11/29/12 11:50 AM      Result Value Range   Glucose-Capillary 124 (*) 70 - 99 mg/dL   Comment 1 Notify RN    GLUCOSE, CAPILLARY     Status: Abnormal   Collection Time    11/29/12  5:13 PM      Result Value Range   Glucose-Capillary 179 (*) 70 - 99 mg/dL   Comment 1 Notify RN    GLUCOSE, CAPILLARY     Status: Abnormal   Collection Time    11/29/12  8:29 PM      Result Value Range   Glucose-Capillary 161 (*) 70 - 99 mg/dL   Comment 1 Notify RN     Dg Chest 2 View  11/28/2012  *RADIOLOGY REPORT*  Clinical Data: Evaluate nodules seen on prior chest radiograph.  CHEST - 2 VIEW  Comparison: Chest x-ray 11/27/2012.  Findings: Again noted is severe cardiomegaly.  Marked pulmonary venous congestion with some Kerley B lines in the periphery of the lungs bilaterally, suggesting mild interstitial pulmonary edema. There is also thickening of the minor fissure.  Subsegmental atelectasis in the lower lobes of the lungs bilaterally.  No definite consolidative airspace disease.  No significant pleural effusions.  Nodular opacities noted on the prior examination in the upper lungs appear similar on today's study, however, it is uncertain whether these represent true pulmonary nodules, or are simply related to distended pulmonary vessels.  Left sided pacemaker / AICD with lead tips projecting over the expected location of the right atrium and right ventricular apex.  IMPRESSION: 1.  Cardiomegaly with mild interstitial pulmonary edema indicative of mild congestive heart failure. 2.  Nodular opacities projecting over the upper lungs appear similar to the recent prior examination, and may simply represent distended pulmonary vessels and edematous  changes.  Underlying pulmonary nodules are difficult to entirely exclude, however, this would require further evaluation with chest CT if of clinical concern.  Alternatively, attention on follow-up radiographs after treatment for underlying congestive heart failure may demonstrate resolution of these findings.   Original Report Authenticated By: Trudie Reed, M.D.    Dg Ankle 2 Views Right  11/28/2012  *RADIOLOGY REPORT*  Clinical  Data: Right ankle pain.  RIGHT ANKLE - 2 VIEW  Comparison: No priors.  Findings: Two views of the right ankle demonstrate osteopenia.  No acute displaced fracture, subluxation, dislocation, joint or soft tissue abnormality.  Mild degenerative changes of osteoarthritis are noted in the midfoot.  IMPRESSION: 1.  No acute radiographic abnormality of the right ankle. 2.  Osteopenia.   Original Report Authenticated By: Trudie Reed, M.D.    Ct Head Wo Contrast  11/29/2012  *RADIOLOGY REPORT*  Clinical Data: New onset right-sided flaccidness.  History of strokes.  CT HEAD WITHOUT CONTRAST  Technique:  Contiguous axial images were obtained from the base of the skull through the vertex without contrast.  Comparison: 11/27/2012  Findings: There is a large area of encephalomalacia in the left cerebral hemisphere involving posterior frontal, temporal, and parietal lobes.  This is stable since the previous study and is consistent with old infarct.  There is diffuse cerebral atrophy. Asymmetric dilatation of the left lateral ventricle is consistent with negative mass effect relating to the old infarct.  No acute mass effect or midline shift.  No abnormal extra-axial fluid collections.  Remaining gray-white matter junctions are distinct. Basal cisterns are not effaced.  Scattered low attenuation changes in the deep white matter most consistent with small vessel ischemia.  The no evidence of acute intracranial hemorrhage.  No depressed skull fractures.  Visualized paranasal sinuses and  mastoid air cells are not opacified.  Vascular calcifications. Generally stable appearance of the intracranial contents since previous study.  IMPRESSION: Old left MCA distribution infarct.  Diffuse atrophy and small vessel ischemic changes.  No acute intracranial abnormalities.   Original Report Authenticated By: Burman Nieves, M.D.    Dg Foot 2 Views Right  11/28/2012  *RADIOLOGY REPORT*  Clinical Data: Pain in the foot and ankle.  RIGHT FOOT - 2 VIEW  Comparison: No priors.  Findings: Bones are osteopenic.  No acute displaced fracture, subluxation, dislocation, joint or soft tissue abnormality.  Mild degenerative changes of osteoarthritis in the midfoot and at the first MTP joint.  IMPRESSION: 1.  No acute radiographic abnormality in the right foot. 2.  Mild degenerative changes of osteoarthritis as above. 3.  Osteopenia.   Original Report Authenticated By: Trudie Reed, M.D.       Assessment: 57 y.o. female with multiple,risk factors for stroke, noted to have complete right hemiplegia by nursing staff, but exact time of onset is unclear. CT brain showed no acute intracranial abnormality. Suspect a new cerebrovascular insult involving left brain, but she is not  a candidate for thrombolytic intervention due to unclear time of onset of symptoms, INR 7, and current anticoagulation with xarelto. Can not have MRI due to pacemaker. Will need follow up CT brain in 24 hours. Stroke team will resume care in the morning and make further recommendations accordingly.    Wyatt Portela, MD [facility] [phone]  11/29/2012, 11:07 PM

## 2012-11-29 NOTE — Significant Event (Addendum)
Rapid Response Event Note  Overview:    Called by primary RN to see pt for acute onset Rt side weakness & ALOC, INR 7 today & rcvd Vit K, LSN unknown  Initial Focused Assessment: Pt with flaccid Rt side, lethargic, exp aphasia. Pt with a hx of signficant old CVA with Rt side weakness & exp aphasia. CBG within acceptable limits, BP 90s  Interventions:  CT scan STAT NS bolus deferred due to very low EF  Event Summary:  Pt transported to CT scan & neurologist oncall updated & saw pt briefly in CT scan.  See NIHSS flowsheet for specific deficits.  Primary team contacted, Benedetto Coons, NP aware & will come see pt.  Not a tPA candidate due to INR & unclear LSN       [facility], April Hedgecock

## 2012-11-29 NOTE — Progress Notes (Signed)
[facility] PROGRESS NOTE  Elizabeth Love:[id number] DOB: [birth year] DOA: 11/27/2012  PCP: [name], [name], MD  Brief HPI: Elizabeth Love is a 57 y.o. female who has a past medical history of Seizures; Hypertension; Stroke; Asthma; Diabetes mellitus; Renal disorder; Chronic systolic CHF (congestive heart failure); Atrial fibrillation; Coagulopathy; History of DVT (deep vein thrombosis); Noncompliance; Hypothyroidism; VT (ventricular tachycardia); and Protein C deficiency. She presented with Hx of headache for long time and leg swelling. She is not able to provide any specifics. Note patient has a very complex medical history; she has history of nonischemic cardiomyopathy with EF of 10% status post AICD placement in 2007 in [place]. 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 It is not quite clear what her baseline mental status is, no family was available. She was brought in by ambulance. She also has a history of atrial fibrillation. She was not thought to be a good candidate for anticoagulation due to noncompliance. But in November 2013 she developed DVT with subsequent pulmonary embolism and was found to have protein C deficiency. She had an IVC filter placement done and was started on Xarelto which is still taking. Of note she has long-standing history of headaches which has been worked up in the past apparently the current CT scan is unremarkable. Chest history of chronic pain and also has trouble verbalizing her symptoms which makes her history taking very difficult. While in ER she had been pointing to her chest occasionally stating that she has some chest pain her troponin was obtained which was slightly elevated at 0.1. Her EKG is unchanged. The past she had had positive troponins which were thought to be secondary to cardiac strain due to CHF. Of note her INR was also  checked although she's not a Coumadin it was shown to be greater than 10. This was discussed with pharmacist and they stated that INR is not an accurate test in monitoring of Xarelto and is routinely severely elevated nonetheless they will followup all patient is here. Patient is complaining of leg pain but her legs do not appear to be swollen. In fact she does not showing signs of fluid overload.   Past medical history:  Past Medical History  Diagnosis Date  . Seizures   . Hypertension   . Stroke     2007 - R sided weakness and expressive aphasia with h/o behavioral problems related to this  . Asthma   . Diabetes mellitus   . 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 Chronic systolic CHF (congestive heart failure)     a. s/p AICD ~2007 ([employer]) - previous care [facility]. No known hx CAD. b. EF 10% by echo 04/2012.  Marland Kitchen Atrial fibrillation     04/2012 - not felt to be an anticoag candidate long term at that time, but later had DVT so was on placed on Xarelto  . Coagulopathy     "Protein C deficiency"  . History of DVT (deep vein thrombosis)     a. 06/2012: RLE DVT, + protein C deficiency, placed on Xarelto. s/p IVC filter  . Noncompliance     Due to mental status, family has had to coax her to take medicines  . Hypothyroidism   . VT (ventricular tachycardia)     a. h/o ICD ~2007-2008. b. Adm 08/2012 with UTI, had VT/ICD shock x 5 (hypokalemic)  .  Protein C deficiency     Consultants: Telephone discussion with Dr. Arline Asp  Procedures: None  Antibiotics: None  Subjective: Patient asleep. Upon waking patient started crying, stating her head and legs hurt. Unable to specify further as yesterday.  Objective: Vital Signs  Filed Vitals:   11/29/12 0000 11/29/12 0400 11/29/12 1123 11/29/12 1200  BP: 96/65 116/81 115/84 113/88  Pulse: 62 75 82 77  Temp: 97.5 F (36.4 C) 97.8 F (36.6 C)  97.9 F (36.6 C)  TempSrc: Oral Oral  Oral  Resp: 18 18  18   SpO2: 91% 99%  99%     Intake/Output Summary (Last 24 hours) at 11/29/12 1410 Last data filed at 11/29/12 1125  Gross per 24 hour  Intake      3 ml  Output      0 ml  Net      3 ml   There were no vitals filed for this visit.   General appearance: alert, distracted and crying spells Head: Normocephalic, without obvious abnormality, atraumatic Resp: clear to auscultation bilaterally Cardio: S1, S2 normal, no S3 or S4, systolic murmur: early systolic 3/6, blowing throughout the precordium, no click and no rub GI: soft, non-tender; bowel sounds normal; no masses,  no organomegaly Extremities: no obvious lesions noted either LE. Mild swelling of right ankle with tenderness to movement.  Pulses: 2+ and symmetric Skin: Skin color, texture, turgor normal. No rashes or lesions Neurologic: Alert. Not answering orientation questions. 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   Lab Results:  Basic Metabolic Panel:  Recent Labs Lab 11/27/12 1815 11/28/12 0400 11/29/12 0448  NA 140 141 142  K 3.4* 3.3* 4.2  CL 103 103 104  CO2 24 25 25   GLUCOSE 87 152* 126*  BUN 18 19 21   CREATININE 0.79 0.91 0.91  CALCIUM 9.4 9.5 9.6  MG  --  1.7  --   PHOS  --  3.0  --    Liver Function Tests:  Recent Labs Lab 11/27/12 1815 11/28/12 0400  AST 22 24  ALT 10 10  ALKPHOS 67 70  BILITOT 1.7* 1.7*  PROT 7.6 8.2  ALBUMIN 3.0* 3.1*   CBC:  Recent Labs Lab 11/27/12 1815 11/28/12 0400  WBC 6.9 7.3  NEUTROABS 5.0  --   HGB 9.5* 9.7*  HCT 29.7* 31.2*  MCV 82.5 84.1  PLT 266 297   Cardiac Enzymes:  Recent Labs Lab 11/28/12 0400 11/28/12 1115 11/28/12 1519  TROPONINI <0.30 <0.30 <0.30   BNP (last 3 results)  Recent Labs  11/05/12 1607 11/06/12 0825 11/27/12 1821  PROBNP 1906.0* 12374.0* 9596.0*   CBG:  Recent Labs Lab 11/28/12 1148 11/28/12 1642 11/28/12 2054 11/29/12 0738 11/29/12 1150  GLUCAP 163* 145* 130* 196* 124*    No results found for this or any previous visit (from the past 240  hour(s)).    Studies/Results: Dg Chest 1 View  11/27/2012  *RADIOLOGY REPORT*  Clinical Data: Chest pain  CHEST - 1 VIEW  Comparison: Prior chest x-ray 11/06/2012  Findings: Marked enlargement the cardiopericardial silhouettes. Chronic blunting of the right costophrenic angle appears similar to prior.  There is pulmonary vascular congestion which appears similar to slightly increased compared to prior.  A suggestion of bilateral upper lung nodules which not present on the relatively recent prior exam.  Query radiolucent cardiac monitor electrodes. Similar appearance of right middle lobe opacity compared to prior favored to reflect atelectasis versus scarring.  Stable left subclavian approach cardiac rhythm maintenance device.  IMPRESSION:  1.  Similar to slightly increased pulmonary vascular congestion. 2.  Stable marked cardiomegaly 3.  Suggestion of bilateral upper lung pulmonary nodules which were not present on the recent prior exam.  Query radiolucent cardiac electrodes.   Original Report Authenticated By: Malachy Moan, M.D.    Dg Chest 2 View  11/28/2012  *RADIOLOGY REPORT*  Clinical Data: Evaluate nodules seen on prior chest radiograph.  CHEST - 2 VIEW  Comparison: Chest x-ray 11/27/2012.  Findings: Again noted is severe cardiomegaly.  Marked pulmonary venous congestion with some Kerley B lines in the periphery of the lungs bilaterally, suggesting mild interstitial pulmonary edema. There is also thickening of the minor fissure.  Subsegmental atelectasis in the lower lobes of the lungs bilaterally.  No definite consolidative airspace disease.  No significant pleural effusions.  Nodular opacities noted on the prior examination in the upper lungs appear similar on today's study, however, it is uncertain whether these represent true pulmonary nodules, or are simply related to distended pulmonary vessels.  Left sided pacemaker / AICD with lead tips projecting over the expected location of the right  atrium and right ventricular apex.  IMPRESSION: 1.  Cardiomegaly with mild interstitial pulmonary edema indicative of mild congestive heart failure. 2.  Nodular opacities projecting over the upper lungs appear similar to the recent prior examination, and may simply represent distended pulmonary vessels and edematous changes.  Underlying pulmonary nodules are difficult to entirely exclude, however, this would require further evaluation with chest CT if of clinical concern.  Alternatively, attention on follow-up radiographs after treatment for underlying congestive heart failure may demonstrate resolution of these findings.   Original Report Authenticated By: Trudie Reed, M.D.    Dg Ankle 2 Views Right  11/28/2012  *RADIOLOGY REPORT*  Clinical Data: Right ankle pain.  RIGHT ANKLE - 2 VIEW  Comparison: No priors.  Findings: Two views of the right ankle demonstrate osteopenia.  No acute displaced fracture, subluxation, dislocation, joint or soft tissue abnormality.  Mild degenerative changes of osteoarthritis are noted in the midfoot.  IMPRESSION: 1.  No acute radiographic abnormality of the right ankle. 2.  Osteopenia.   Original Report Authenticated By: Trudie Reed, M.D.    Ct Head Wo Contrast  11/27/2012  *RADIOLOGY REPORT*  Clinical Data: Headache.  Elevated INR.  CT HEAD WITHOUT CONTRAST  Technique:  Contiguous axial images were obtained from the base of the skull through the vertex without contrast.  Comparison: CT head without contrast 02/21/ 2014  Findings: The remote left MCA territory infarct is stable. Expected dilation of the lateral ventricles is evident.  Remote lacunar infarcts of the cerebellum are stable.  Mild white matter disease is unchanged.  No acute to infarct, hemorrhage, mass lesion is present.  The paranasal sinuses and mastoid air cells are clear.  IMPRESSION:  1.  No acute intracranial abnormality or significant interval change. 2.  Remote left MCA territory infarct.   Original  Report Authenticated By: Marin Roberts, M.D.    Dg Foot 2 Views Right  11/28/2012  *RADIOLOGY REPORT*  Clinical Data: Pain in the foot and ankle.  RIGHT FOOT - 2 VIEW  Comparison: No priors.  Findings: Bones are osteopenic.  No acute displaced fracture, subluxation, dislocation, joint or soft tissue abnormality.  Mild degenerative changes of osteoarthritis in the midfoot and at the first MTP joint.  IMPRESSION: 1.  No acute radiographic abnormality in the right foot. 2.  Mild degenerative changes of osteoarthritis as above. 3.  Osteopenia.  Original Report Authenticated By: Trudie Reed, M.D.     Medications:  Scheduled: . ALPRAZolam  0.5 mg Oral BID  . amiodarone  400 mg Oral Daily  . aspirin EC  81 mg Oral Daily  . docusate sodium  100 mg Oral BID  . escitalopram  10 mg Oral Daily  . feeding supplement  237 mL Oral BID BM  . glipiZIDE  5 mg Oral Q breakfast  . levETIRAcetam  1,000 mg Oral BID  . levothyroxine  50 mcg Oral QAC breakfast  . lisinopril  5 mg Oral Daily  . metoprolol tartrate  25 mg Oral BID  . oxyCODONE-acetaminophen  1 tablet Oral Q8H   And  . oxyCODONE  5 mg Oral Q8H  . pantoprazole  40 mg Oral Daily  . QUEtiapine  50 mg Oral Daily  . Rivaroxaban  20 mg Oral Q supper  . sodium chloride  3 mL Intravenous Q12H  . sodium chloride  3 mL Intravenous Q12H  . spironolactone  25 mg Oral Daily   Continuous:  ZOX:WRUEAV chloride, acetaminophen, acetaminophen, albuterol, HYDROcodone-acetaminophen, morphine injection, ondansetron (ZOFRAN) IV, ondansetron, sodium chloride  Assessment/Plan:  Active Problems:   Atrial fibrillation   Cardiomyopathy   Hypokalemia   Protein C deficiency   Acute on chronic systolic CHF (congestive heart failure)   Chest pain at rest   Headache    Chest pain at rest EKG's do not show acute ischemic changes. She has ruled out for ACS. Patient does not have any history of coronary artery disease. She has nonischemic cardiomyopathy  with an EF of 10%. She appears to be well compensated currently. No further work up at this time.   Leg pain/Possible Gout X rays did not reveal anything acute. Uric acid was significantly high. Given one dose of colchicine yesterday. See PN from 3/13. Continue to monitor. Use tylenol as needed.  History of DVT/PE and Protein C deficiency INR was elevated. INR and PT levels are not used for therapeutic monitoring in those on Xarelto. Anti factor xa is preferable. Continue Rivaroxaban, would not use INR to monitor. At this point is no evidence of bleeding. INR is decreasing. Discussed with Dr. Arline Asp 3/13. No clear explanation for her presentation. Could have Vit K deficiency and so was given same. Will dose again today. This will not interfere with Rivaroxaban and it's effects.  Hypokalemia Repleted. Mag was ok.  History of Atrial fibrillation Stable. Continue home medications   Non-ischemic Cardiomyopathy  Currently appears to be euvolemic. Cntinue Lasix   Headache This is chronic. CT scan unremarkable. Treat symptomatically.   Code Status Full Code  DVT Prophylaxis On Rivaroxaban  Family Communication: None at bedside  Disposition Plan: Should be able to discharge once INR is around 5. Will need home health Rn and home safety evaluation.    LOS: 2 days   [facility]  [facility] Pager [phone] 11/29/2012, 2:10 PM  If 8PM-8AM, please contact night-coverage at [email or web address], password [facility]

## 2012-11-29 NOTE — Progress Notes (Signed)
[facility] progress note. Chief complaint. Right side flaccidity. History of present illness. This 57 year old female in hospital with chest pain at rest. She has a history of PE/DVT and proteins C. deficiency. She was noted to have an elevated INR on admission and was given vitamin K. She is maintained on Zarelto as an oral anticoagulant. She was noted by the nursing staff to have a facial droop, right side flaccidity. Rapid response was notified and came to the bedside. They took the patient urgently to a CAT scan of the brain which showed old left MCA infarct. Diffuse atrophy and small vessel ischemic changes. No acute intracranial abnormality. Dr. Leroy Kennedy neuro hospitalist saw the patient in radiology and after a brief exam felt patient was not a candidate for TPA. Patient was returned to her room and I came to see the patient at bedside. Dr. Leroy Kennedy will also be up this evening to do more thorough exam and submit any orders/recommendations felt appropriate. Vital signs. Temperature 97.1, pulse 66, respiration 18, blood pressure 92/63. O2 sats 95%. General appearance. Frail appearing 56 year old female who is somnolent but aroused with minimal stimulation. She is nonverbal and does not follow commands. Cardiac. Rate and rhythm regular. Lungs. Poor inspiration effort but breath sounds clear equal bilaterally. Abdomen. Soft with positive bowel sounds. Neurologic. Patient does have facial droop present. The pupils appear equal but the patient really noncooperative with further neurologic testing. Patient has a flaccid right upper and lower extremity. Right Babinski appears upgoing. Impression/plan. Problem #1. Probable new TIA/CVA. Patient clinically appears stable. Neural hospitalist Dr. Leroy Kennedy is aware patient and will be following in consult. Her unresponsive reports the patient is not considered a candidate for TPA. Certainly the patient is already anticoagulated with Zarelto and at this point I  have nothing further to add. Neurology will likely add MRI/MRA for further review but has not done yet finished with this patient.

## 2012-11-29 NOTE — Progress Notes (Signed)
MD notified of pt's PT/INR of 59.5 & 7.71. New orders will be given. Information passed on to day shift RN. Will continue to monitor the pt. Sanda Linger

## 2012-11-30 ENCOUNTER — Inpatient Hospital Stay (HOSPITAL_COMMUNITY): Payer: Medicare Other

## 2012-11-30 DIAGNOSIS — R079 Chest pain, unspecified: Secondary | ICD-10-CM

## 2012-11-30 LAB — COMPREHENSIVE METABOLIC PANEL
ALT: 17 U/L (ref 0–35)
Alkaline Phosphatase: 83 U/L (ref 39–117)
CO2: 23 mEq/L (ref 19–32)
Calcium: 9.4 mg/dL (ref 8.4–10.5)
GFR calc Af Amer: 74 mL/min — ABNORMAL LOW (ref >90)
GFR calc non Af Amer: 64 mL/min — ABNORMAL LOW (ref >90)
Glucose, Bld: 82 mg/dL (ref 70–99)
Potassium: 5 mEq/L (ref 3.5–5.1)
Sodium: 139 mEq/L (ref 135–145)
Total Bilirubin: 1.7 mg/dL — ABNORMAL HIGH (ref 0.3–1.2)

## 2012-11-30 LAB — CBC
Hemoglobin: 8.7 g/dL — ABNORMAL LOW (ref 12.0–15.0)
MCH: 26.4 pg (ref 26.0–34.0)
RBC: 3.3 MIL/uL — ABNORMAL LOW (ref 3.87–5.11)

## 2012-11-30 LAB — GLUCOSE, CAPILLARY: Glucose-Capillary: 90 mg/dL (ref 70–99)

## 2012-11-30 LAB — PROTIME-INR: INR: 10.79 (ref 0.00–1.49)

## 2012-11-30 MED ORDER — LEVETIRACETAM 500 MG/5ML IV SOLN
1000.0000 mg | Freq: Two times a day (BID) | INTRAVENOUS | Status: DC
  Administered 2012-11-30 – 2012-12-02 (×4): 1000 mg via INTRAVENOUS
  Filled 2012-11-30 (×6): qty 10

## 2012-11-30 MED ORDER — METOPROLOL TARTRATE 1 MG/ML IV SOLN
2.5000 mg | Freq: Four times a day (QID) | INTRAVENOUS | Status: DC
  Administered 2012-12-01 (×4): 2.5 mg via INTRAVENOUS
  Filled 2012-11-30 (×10): qty 5

## 2012-11-30 MED ORDER — DEXTROSE 5 % IV SOLN
INTRAVENOUS | Status: DC
  Administered 2012-11-30 – 2012-12-03 (×4): via INTRAVENOUS

## 2012-11-30 MED ORDER — DEXTROSE 50 % IV SOLN
25.0000 mL | Freq: Once | INTRAVENOUS | Status: AC | PRN
  Administered 2012-11-30: 25 mL via INTRAVENOUS

## 2012-11-30 MED ORDER — DEXTROSE 50 % IV SOLN
INTRAVENOUS | Status: AC
  Administered 2012-11-30: 25 mL
  Filled 2012-11-30: qty 50

## 2012-11-30 NOTE — Progress Notes (Signed)
SLP Cancellation Note  Patient Details Name: Elizabeth Love MRN: [id number] DOB: November 15, 1955   Cancelled treatment:  ST received order for BSE. Attempt x1 but not completed as unable to awaken Pt.  fully to safely administer PO trials.  ST to reattempt at later date.  Moreen Fowler MS, CCC-SLP [phone] [facility] 11/30/2012, 1:44 PM

## 2012-11-30 NOTE — Progress Notes (Addendum)
Stroke Team Progress Note  HISTORY Elizabeth Love is an 57 y.o. female with hypertension, diabetes mellitus, CHF-EF 10%, atrial fibrillation, stroke with residual right sided weakness and expressive aphasia, VT status post ICD placement, protein C deficiency, DVT on xarelto, hypothyroidism, asthma, who tonight was noted by the nursing staff to have a facial droop and complete right sided flaccidity. Rapid response was notified and then I was informed about the situation by the rapid response nurse. Exact time of onset is unclear. Of importance, she had INR 10 upon admission and received vitamin K, but INR 7 today.  She underwent emergent CT brain that showed no acute ischemia or hemorrhage.    SUBJECTIVE No family is at bedside. The patient is aphasic, and she complains of a severe headache, and pain in the right leg with movement.   OBJECTIVE Most recent Vital Signs: Filed Vitals:   11/29/12 2200 11/30/12 0000 11/30/12 0400 11/30/12 0923  BP: 111/71 119/83 109/78   Pulse: 81 76 64   Temp:  97.9 F (36.6 C) 97.1 F (36.2 C)   TempSrc:  Oral Oral   Resp:  18 18   Height:    5\' 2"  (1.575 m)  Weight:    52.164 kg (115 lb)  SpO2:  95% 90%    CBG (last 3)   Recent Labs  11/29/12 1150 11/29/12 1713 11/29/12 2029  GLUCAP 124* 179* 161*    IV Fluid Intake:     MEDICATIONS  . ALPRAZolam  0.5 mg Oral BID  . amiodarone  400 mg Oral Daily  . aspirin EC  81 mg Oral Daily  . docusate sodium  100 mg Oral BID  . escitalopram  10 mg Oral Daily  . feeding supplement  237 mL Oral BID BM  . glipiZIDE  5 mg Oral Q breakfast  . levETIRAcetam  1,000 mg Oral BID  . levothyroxine  50 mcg Oral QAC breakfast  . lisinopril  5 mg Oral Daily  . metoprolol tartrate  25 mg Oral BID  . oxyCODONE-acetaminophen  1 tablet Oral Q8H   And  . oxyCODONE  5 mg Oral Q8H  . pantoprazole  40 mg Oral Daily  . QUEtiapine  50 mg Oral Daily  . Rivaroxaban  20 mg Oral Q supper  . sodium chloride  3 mL  Intravenous Q12H  . sodium chloride  3 mL Intravenous Q12H  . spironolactone  25 mg Oral Daily   PRN:  sodium chloride, acetaminophen, acetaminophen, albuterol, HYDROcodone-acetaminophen, morphine injection, ondansetron (ZOFRAN) IV, ondansetron, sodium chloride  Diet:  Cardiac thin liquids Activity:  Up with assistance DVT Prophylaxis:  Xarelto  CLINICALLY SIGNIFICANT STUDIES Basic Metabolic Panel:  Recent Labs Lab 11/27/12 1815 11/28/12 0400 11/29/12 0448  NA 140 141 142  K 3.4* 3.3* 4.2  CL 103 103 104  CO2 24 25 25   GLUCOSE 87 152* 126*  BUN 18 19 21   CREATININE 0.79 0.91 0.91  CALCIUM 9.4 9.5 9.6  MG  --  1.7  --   PHOS  --  3.0  --    Liver Function Tests:  Recent Labs Lab 11/27/12 1815 11/28/12 0400  AST 22 24  ALT 10 10  ALKPHOS 67 70  BILITOT 1.7* 1.7*  PROT 7.6 8.2  ALBUMIN 3.0* 3.1*   CBC:  Recent Labs Lab 11/27/12 1815 11/28/12 0400  WBC 6.9 7.3  NEUTROABS 5.0  --   HGB 9.5* 9.7*  HCT 29.7* 31.2*  MCV 82.5 84.1  PLT 266  297   Coagulation:  Recent Labs Lab 11/27/12 1815 11/28/12 0052 11/29/12 0418 11/30/12 0455  LABPROT 89.5* 67.9* 59.5* 76.3*  INR >10.00* 9.22* 7.71* 10.79*   Cardiac Enzymes:  Recent Labs Lab 11/28/12 0400 11/28/12 1115 11/28/12 1519  TROPONINI <0.30 <0.30 <0.30   Urinalysis:  Recent Labs Lab 11/27/12 2249  COLORURINE YELLOW  LABSPEC 1.017  PHURINE 6.0  GLUCOSEU NEGATIVE  HGBUR NEGATIVE  BILIRUBINUR SMALL*  KETONESUR NEGATIVE  PROTEINUR 30*  UROBILINOGEN 2.0*  NITRITE NEGATIVE  LEUKOCYTESUR TRACE*   Lipid Panel No results found for this basename: chol, trig, hdl, cholhdl, vldl, ldlcalc   HgbA1C  Lab Results  Component Value Date   HGBA1C 6.3* 11/06/2012    Urine Drug Screen:     Component Value Date/Time   LABOPIA NONE DETECTED 07/19/2012 2324   COCAINSCRNUR NONE DETECTED 07/19/2012 2324   LABBENZ POSITIVE* 07/19/2012 2324   AMPHETMU NONE DETECTED 07/19/2012 2324   THCU NONE DETECTED 07/19/2012  2324   LABBARB NONE DETECTED 07/19/2012 2324    Alcohol Level: No results found for this basename: ETH,  in the last 168 hours  Ct Head Wo Contrast  11/29/2012  *RADIOLOGY REPORT*  Clinical Data: New onset right-sided flaccidness.  History of strokes.  CT HEAD WITHOUT CONTRAST  Technique:  Contiguous axial images were obtained from the base of the skull through the vertex without contrast.  Comparison: 11/27/2012  Findings: There is a large area of encephalomalacia in the left cerebral hemisphere involving posterior frontal, temporal, and parietal lobes.  This is stable since the previous study and is consistent with old infarct.  There is diffuse cerebral atrophy. Asymmetric dilatation of the left lateral ventricle is consistent with negative mass effect relating to the old infarct.  No acute mass effect or midline shift.  No abnormal extra-axial fluid collections.  Remaining gray-white matter junctions are distinct. Basal cisterns are not effaced.  Scattered low attenuation changes in the deep white matter most consistent with small vessel ischemia.  The no evidence of acute intracranial hemorrhage.  No depressed skull fractures.  Visualized paranasal sinuses and mastoid air cells are not opacified.  Vascular calcifications. Generally stable appearance of the intracranial contents since previous study.  IMPRESSION: Old left MCA distribution infarct.  Diffuse atrophy and small vessel ischemic changes.  No acute intracranial abnormalities.   Original Report Authenticated By: Burman Nieves, M.D.     CT of the brain    IMPRESSION:  1. No acute intracranial abnormality or significant interval  change.  2. Remote left MCA territory infarct.  MRI of the brain    MRA of the brain    2D Echocardiogram    Carotid Doppler    CXR    EKG   Normal sinus rhythm Low voltage QRS Nonspecific T wave abnormality Prolonged QT Abnormal ECG  Therapy Recommendations   Physical Exam   The patient is  alert and cooperative, but she is aphasic.  Neurologic exam reveals full extraocular movements. Visual fields are difficult to test, she blinks to threat from the left, less well from the right.  Motor testing reveals good strength of the left extremities. On the right, there is severe weakness, decreased motor tone on the arm and leg.  The patient was unable to cooperate for cerebellar testing. Gait was not tested.  Deep tendon reflexes are symmetric, depressed.    ASSESSMENT Ms. AYSHA LIVECCHI is a 57 y.o. female presenting with increased right HP.   On Xarelto prior to admission.  INR remains significantly elevated, 10.79.   Atrial fibrillation, on anticoagulation  Protein C deficiency  Cardiomyopathy  Chronic left MCA CVA, with residual right HP, and aphasia  Hospital day # 3  TREATMENT/PLAN  The patient is on Xarelto  EEG study, rule out seizure  Repeat CT brain  Carotid doppler  Will follow  11/30/2012 10:29 AM  I have personally obtained a history, examined the patient, evaluated imaging results, and formulated the assessment and plan of care. I agree with the above. Lesly Dukes

## 2012-11-30 NOTE — Progress Notes (Addendum)
SLP Cancellation Note  Patient Details Name: Elizabeth Love MRN: [id number] DOB: November 22, 1955   Cancelled treatment:   RN paged SLP to reattempt BSE as patient more alert and cooperative.  Attempt x2 but not completed as patient continued with decreased LOA.  ST to attempt on 12/01/12.    Moreen Fowler MS, CCC-SLP [phone] [facility] 11/30/2012, 5:09 PM

## 2012-11-30 NOTE — Progress Notes (Signed)
Utilization review completed.  

## 2012-11-30 NOTE — Progress Notes (Signed)
Patient found lying in bed, minimally responsive and lethargic with right facial droop and right extremities flaccid.  Patient moans "uh huh" when asked questions, otherwise nonverbal.  Moves left extremities to command.  PERRLA.  Last seen normal ~1930 at which time she was alert and verbally responsive sitting up on bedside commode with NT at bedside.  Rapid Response RN, April, notified, as well as Lenny Pastel, NP.  Will continue to monitor.  Alonza Bogus

## 2012-11-30 NOTE — Progress Notes (Signed)
CRITICAL VALUE ALERT  Critical value received:  INR 10.79  Date of notification:  11/30/12  Time of notification:  0610  Critical value read back:yes  Nurse who received alert:  D. Buzzy Han, RN  MD notified (1st page):  Lenny Pastel, NP  Time of first page:  0615  MD notified (2nd page):  Time of second page:  Responding MD:  Lenny Pastel, NP  Time MD responded:  [phone]

## 2012-11-30 NOTE — Progress Notes (Signed)
[facility] PROGRESS NOTE  [name]:[id number] DOB: 05-06-1956 DOA: 11/27/2012  PCP: [name], [name], MD  Brief HPI: Elizabeth Love is a 57 y.o. female who has a past medical history of Seizures; Hypertension; Stroke; Asthma; Diabetes mellitus; Renal disorder; Chronic systolic CHF (congestive heart failure); Atrial fibrillation; Coagulopathy; History of DVT (deep vein thrombosis); Noncompliance; Hypothyroidism; VT (ventricular tachycardia); and Protein C deficiency. She presented with Hx of headache for long time and leg swelling. She is not able to provide any specifics. Note patient has a very complex medical history; she has history of nonischemic cardiomyopathy with EF of 10% status post AICD placement in 2007 in [place]. 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 It is not quite clear what her baseline mental status is, no family was available. She was brought in by ambulance. She also has a history of atrial fibrillation. She was not thought to be a good candidate for anticoagulation due to noncompliance. But in November 2013 she developed DVT with subsequent pulmonary embolism and was found to have protein C deficiency. She had an IVC filter placement done and was started on Xarelto which is still taking. Of note she has long-standing history of headaches which has been worked up in the past apparently the current CT scan is unremarkable. Chest history of chronic pain and also has trouble verbalizing her symptoms which makes her history taking very difficult. While in ER she had been pointing to her chest occasionally stating that she has some chest pain her troponin was obtained which was slightly elevated at 0.1. Her EKG is unchanged. The past she had had positive troponins which were thought to be secondary to cardiac strain due to CHF. Of note her INR was also  checked although she's not a Coumadin it was shown to be greater than 10. This was discussed with pharmacist and they stated that INR is not an accurate test in monitoring of Xarelto and is routinely severely elevated nonetheless they will followup all patient is here. Patient is complaining of leg pain but her legs do not appear to be swollen. In fact she does not showing signs of fluid overload.   Past medical history:  Past Medical History  Diagnosis Date  . Seizures   . Hypertension   . Stroke     2007 - R sided weakness and expressive aphasia with h/o behavioral problems related to this  . Asthma   . Diabetes mellitus   . 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 Chronic systolic CHF (congestive heart failure)     a. s/p AICD ~2007 ([employer]) - previous care [place]. No known hx CAD. b. EF 10% by echo 04/2012.  Marland Kitchen Atrial fibrillation     04/2012 - not felt to be an anticoag candidate long term at that time, but later had DVT so was on placed on Xarelto  . Coagulopathy     "Protein C deficiency"  . History of DVT (deep vein thrombosis)     a. 06/2012: RLE DVT, + protein C deficiency, placed on Xarelto. s/p IVC filter  . Noncompliance     Due to mental status, family has had to coax her to take medicines  . Hypothyroidism   . VT (ventricular tachycardia)     a. h/o ICD ~2007-2008. b. Adm 08/2012 with UTI, had VT/ICD shock x 5 (hypokalemic)  .  Protein C deficiency     Consultants: Telephone discussion with Dr. Arline Asp  Procedures: None  Antibiotics: None  Subjective: Overnight events noted. Currently patient seems a bit more lethargic than yesterday but once she woke up, her behavior was same as before. Has old right hemiparesis. Continues to complain of chest pain.  Objective: Vital Signs  Filed Vitals:   11/29/12 2030 11/29/12 2200 11/30/12 0000 11/30/12 0400  BP: 92/63 111/71 119/83 109/78  Pulse: 66 81 76 64  Temp: 97.1 F (36.2 C)  97.9 F (36.6 C) 97.1 F  (36.2 C)  TempSrc: Oral  Oral Oral  Resp: 18  18 18   SpO2: 95%  95% 90%    Intake/Output Summary (Last 24 hours) at 11/30/12 0827 Last data filed at 11/29/12 1125  Gross per 24 hour  Intake      3 ml  Output      0 ml  Net      3 ml   There were no vitals filed for this visit.   General appearance: alert, distracted and crying spells Head: Normocephalic, without obvious abnormality, atraumatic Resp: clear to auscultation bilaterally Cardio: S1, S2 normal, no S3 or S4, systolic murmur: early systolic 3/6, blowing throughout the precordium, no click and no rub GI: soft, non-tender; bowel sounds normal; no masses,  no organomegaly Extremities: no obvious lesions noted either LE. Mild swelling of right ankle with tenderness to movement.  Pulses: 2+ and symmetric Skin: Skin color, texture, turgor normal. No rashes or lesions Neurologic: No appreciable change from before except maybe slightly more lethargic and increased right weakness. She did have right weakness before as well.  Lab Results:  Basic Metabolic Panel:  Recent Labs Lab 11/27/12 1815 11/28/12 0400 11/29/12 0448  NA 140 141 142  K 3.4* 3.3* 4.2  CL 103 103 104  CO2 24 25 25   GLUCOSE 87 152* 126*  BUN 18 19 21   CREATININE 0.79 0.91 0.91  CALCIUM 9.4 9.5 9.6  MG  --  1.7  --   PHOS  --  3.0  --    Liver Function Tests:  Recent Labs Lab 11/27/12 1815 11/28/12 0400  AST 22 24  ALT 10 10  ALKPHOS 67 70  BILITOT 1.7* 1.7*  PROT 7.6 8.2  ALBUMIN 3.0* 3.1*   CBC:  Recent Labs Lab 11/27/12 1815 11/28/12 0400  WBC 6.9 7.3  NEUTROABS 5.0  --   HGB 9.5* 9.7*  HCT 29.7* 31.2*  MCV 82.5 84.1  PLT 266 297   Cardiac Enzymes:  Recent Labs Lab 11/28/12 0400 11/28/12 1115 11/28/12 1519  TROPONINI <0.30 <0.30 <0.30   BNP (last 3 results)  Recent Labs  11/05/12 1607 11/06/12 0825 11/27/12 1821  PROBNP 1906.0* 12374.0* 9596.0*   CBG:  Recent Labs Lab 11/28/12 2054 11/29/12 0738  11/29/12 1150 11/29/12 1713 11/29/12 2029  GLUCAP 130* 196* 124* 179* 161*    No results found for this or any previous visit (from the past 240 hour(s)).    Studies/Results: Dg Chest 2 View  11/28/2012  *RADIOLOGY REPORT*  Clinical Data: Evaluate nodules seen on prior chest radiograph.  CHEST - 2 VIEW  Comparison: Chest x-ray 11/27/2012.  Findings: Again noted is severe cardiomegaly.  Marked pulmonary venous congestion with some Kerley B lines in the periphery of the lungs bilaterally, suggesting mild interstitial pulmonary edema. There is also thickening of the minor fissure.  Subsegmental atelectasis in the lower lobes of the lungs bilaterally.  No definite consolidative airspace  disease.  No significant pleural effusions.  Nodular opacities noted on the prior examination in the upper lungs appear similar on today's study, however, it is uncertain whether these represent true pulmonary nodules, or are simply related to distended pulmonary vessels.  Left sided pacemaker / AICD with lead tips projecting over the expected location of the right atrium and right ventricular apex.  IMPRESSION: 1.  Cardiomegaly with mild interstitial pulmonary edema indicative of mild congestive heart failure. 2.  Nodular opacities projecting over the upper lungs appear similar to the recent prior examination, and may simply represent distended pulmonary vessels and edematous changes.  Underlying pulmonary nodules are difficult to entirely exclude, however, this would require further evaluation with chest CT if of clinical concern.  Alternatively, attention on follow-up radiographs after treatment for underlying congestive heart failure may demonstrate resolution of these findings.   Original Report Authenticated By: Trudie Reed, M.D.    Dg Ankle 2 Views Right  11/28/2012  *RADIOLOGY REPORT*  Clinical Data: Right ankle pain.  RIGHT ANKLE - 2 VIEW  Comparison: No priors.  Findings: Two views of the right ankle  demonstrate osteopenia.  No acute displaced fracture, subluxation, dislocation, joint or soft tissue abnormality.  Mild degenerative changes of osteoarthritis are noted in the midfoot.  IMPRESSION: 1.  No acute radiographic abnormality of the right ankle. 2.  Osteopenia.   Original Report Authenticated By: Trudie Reed, M.D.    Ct Head Wo Contrast  11/29/2012  *RADIOLOGY REPORT*  Clinical Data: New onset right-sided flaccidness.  History of strokes.  CT HEAD WITHOUT CONTRAST  Technique:  Contiguous axial images were obtained from the base of the skull through the vertex without contrast.  Comparison: 11/27/2012  Findings: There is a large area of encephalomalacia in the left cerebral hemisphere involving posterior frontal, temporal, and parietal lobes.  This is stable since the previous study and is consistent with old infarct.  There is diffuse cerebral atrophy. Asymmetric dilatation of the left lateral ventricle is consistent with negative mass effect relating to the old infarct.  No acute mass effect or midline shift.  No abnormal extra-axial fluid collections.  Remaining gray-white matter junctions are distinct. Basal cisterns are not effaced.  Scattered low attenuation changes in the deep white matter most consistent with small vessel ischemia.  The no evidence of acute intracranial hemorrhage.  No depressed skull fractures.  Visualized paranasal sinuses and mastoid air cells are not opacified.  Vascular calcifications. Generally stable appearance of the intracranial contents since previous study.  IMPRESSION: Old left MCA distribution infarct.  Diffuse atrophy and small vessel ischemic changes.  No acute intracranial abnormalities.   Original Report Authenticated By: Burman Nieves, M.D.    Dg Foot 2 Views Right  11/28/2012  *RADIOLOGY REPORT*  Clinical Data: Pain in the foot and ankle.  RIGHT FOOT - 2 VIEW  Comparison: No priors.  Findings: Bones are osteopenic.  No acute displaced fracture,  subluxation, dislocation, joint or soft tissue abnormality.  Mild degenerative changes of osteoarthritis in the midfoot and at the first MTP joint.  IMPRESSION: 1.  No acute radiographic abnormality in the right foot. 2.  Mild degenerative changes of osteoarthritis as above. 3.  Osteopenia.   Original Report Authenticated By: Trudie Reed, M.D.     Medications:  Scheduled: . ALPRAZolam  0.5 mg Oral BID  . amiodarone  400 mg Oral Daily  . aspirin EC  81 mg Oral Daily  . docusate sodium  100 mg Oral BID  . escitalopram  10 mg Oral Daily  . feeding supplement  237 mL Oral BID BM  . glipiZIDE  5 mg Oral Q breakfast  . levETIRAcetam  1,000 mg Oral BID  . levothyroxine  50 mcg Oral QAC breakfast  . lisinopril  5 mg Oral Daily  . metoprolol tartrate  25 mg Oral BID  . oxyCODONE-acetaminophen  1 tablet Oral Q8H   And  . oxyCODONE  5 mg Oral Q8H  . pantoprazole  40 mg Oral Daily  . QUEtiapine  50 mg Oral Daily  . Rivaroxaban  20 mg Oral Q supper  . sodium chloride  3 mL Intravenous Q12H  . sodium chloride  3 mL Intravenous Q12H  . spironolactone  25 mg Oral Daily   Continuous:  WGN:FAOZHY chloride, acetaminophen, acetaminophen, albuterol, HYDROcodone-acetaminophen, morphine injection, ondansetron (ZOFRAN) IV, ondansetron, sodium chloride  Assessment/Plan:  Active Problems:   Atrial fibrillation   Cardiomyopathy   Hypokalemia   Protein C deficiency   Acute on chronic systolic CHF (congestive heart failure)   Chest pain at rest   Headache    Right facial droop and increased Right weakness It's possible she had a new event. She does have a history of atrial fibrillation. Ct showed old stroke. MRI cannot be done due to AICD. Neurology following. Await their recommendations. On Xarelto.   Chest pain at rest EKG's do not show acute ischemic changes. She has ruled out for ACS. Patient does not have any history of coronary artery disease. She has nonischemic cardiomyopathy with an EF  of 10%. She appears to be well compensated currently. No further work up at this time.   Leg pain/Possible Gout X rays did not reveal anything acute. Uric acid was significantly high. Given one dose of colchicine yesterday. See PN from 3/13. Continue to monitor. Use tylenol as needed.  History of DVT/PE and Protein C deficiency INR is elevated. INR and PT levels are not used for therapeutic monitoring in those on Xarelto. Anti factor xa is preferable. Continue Rivaroxaban, would not use INR to monitor. At this point is no evidence of bleeding. Discussed with Dr. Arline Asp 3/13. No clear explanation for her presentation. Could have Vit K deficiency and so was given same. Will dose again today. This will not interfere with Rivaroxaban and it's effects.  Hypokalemia Repleted. Mag was ok.  History of Atrial fibrillation Stable. Continue home medications   Non-ischemic Cardiomyopathy  Currently appears to be euvolemic. Cntinue Lasix   Headache This is chronic. CT scan unremarkable. Treat symptomatically.   Code Status Full Code  DVT Prophylaxis On Rivaroxaban  Family Communication: None at bedside  Disposition Plan: Unclear for now due to new event. Will get PT/OT eval.    LOS: 3 days   [facility]  [facility] Pager [phone] 11/30/2012, 8:27 AM  If 8PM-8AM, please contact night-coverage at [email or web address], password [facility]

## 2012-11-30 NOTE — Progress Notes (Signed)
PT Cancellation Note  Patient Details Name: Elizabeth Love MRN: [id number] DOB: 1955/11/17   Cancelled Treatment:    Reason Eval/Treat Not Completed: Fatigue/lethargy limiting ability to participate (could not awaken patient)   Olivia Canter 11/30/2012, 10:37 AM

## 2012-12-01 DIAGNOSIS — I6350 Cerebral infarction due to unspecified occlusion or stenosis of unspecified cerebral artery: Secondary | ICD-10-CM

## 2012-12-01 LAB — CBC
HCT: 27.1 % — ABNORMAL LOW (ref 36.0–46.0)
Hemoglobin: 8.8 g/dL — ABNORMAL LOW (ref 12.0–15.0)
MCH: 26.3 pg (ref 26.0–34.0)
MCV: 81.1 fL (ref 78.0–100.0)
RBC: 3.34 MIL/uL — ABNORMAL LOW (ref 3.87–5.11)

## 2012-12-01 LAB — BASIC METABOLIC PANEL
CO2: 22 mEq/L (ref 19–32)
Calcium: 9.5 mg/dL (ref 8.4–10.5)
Glucose, Bld: 77 mg/dL (ref 70–99)
Sodium: 137 mEq/L (ref 135–145)

## 2012-12-01 LAB — GLUCOSE, CAPILLARY
Glucose-Capillary: 75 mg/dL (ref 70–99)
Glucose-Capillary: 178 mg/dL — ABNORMAL HIGH (ref 70–99)
Glucose-Capillary: 94 mg/dL (ref 70–99)

## 2012-12-01 LAB — POTASSIUM: Potassium: 5.1 mEq/L (ref 3.5–5.1)

## 2012-12-01 LAB — PROTIME-INR: Prothrombin Time: 64.4 seconds — ABNORMAL HIGH (ref 11.6–15.2)

## 2012-12-01 MED ORDER — VITAMIN K1 10 MG/ML IJ SOLN
10.0000 mg | Freq: Once | INTRAVENOUS | Status: AC
  Administered 2012-12-01: 10 mg via INTRAVENOUS
  Filled 2012-12-01: qty 1

## 2012-12-01 MED ORDER — DEXTROSE 50 % IV SOLN
50.0000 mL | Freq: Once | INTRAVENOUS | Status: AC | PRN
  Administered 2012-12-01: 50 mL via INTRAVENOUS
  Filled 2012-12-01: qty 50

## 2012-12-01 NOTE — Evaluation (Addendum)
Clinical/Bedside Swallow Evaluation Patient Details  Name: MAIZY DAVANZO MRN: [id number] Date of Birth: [birth year]  Today's Date: 12/01/2012 Time: 1100-1130 SLP Time Calculation (min): 30 min  Past Medical History:  Past Medical History  Diagnosis Date  . Seizures   . Hypertension   . Stroke     2007 - R sided weakness and expressive aphasia with h/o behavioral problems related to this  . Asthma   . Diabetes mellitus   . 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 Chronic systolic CHF (congestive heart failure)     a. s/p AICD ~2007 ([employer]) - previous care [place]. No known hx CAD. b. EF 10% by echo 04/2012.  Marland Kitchen Atrial fibrillation     04/2012 - not felt to be an anticoag candidate long term at that time, but later had DVT so was on placed on Xarelto  . Coagulopathy     "Protein C deficiency"  . History of DVT (deep vein thrombosis)     a. 06/2012: RLE DVT, + protein C deficiency, placed on Xarelto. s/p IVC filter  . Noncompliance     Due to mental status, family has had to coax her to take medicines  . Hypothyroidism   . VT (ventricular tachycardia)     a. h/o ICD ~2007-2008. b. Adm 08/2012 with UTI, had VT/ICD shock x 5 (hypokalemic)  . Protein C deficiency    Past Surgical History:  Past Surgical History  Procedure Laterality Date  . Cholecystectomy    . Appendectomy    . Pacemaker insertion    . Vena cava filter placement  07/19/2012    Procedure: INSERTION VENA-CAVA FILTER;  Surgeon: Larina Earthly, MD;  Location: [facility] OR;  Service: Vascular;  Laterality: [name];  . Insert / replace / remove pacemaker      ICD  . Abdominal hysterectomy     HPI:  MARVELENE STONEBERG is an 57 y.o. female with hypertension, diabetes mellitus, CHF-EF 10%, atrial fibrillation, stroke with residual right sided weakness and expressive aphasia.  Admitted to ED with headache, chest pain and felt her defibrillator fire .  VT status post ICD placement, protein C deficiency, DVT on xarelto,  hypothyroidism, asthma, who tonight was noted by the nursing staff to have a facial droop and complete right sided flaccidity.  Rapid response was notified and then I was informed about the situation by the rapid response nurse. Exact time of onset is unclear. Of importance, she had INR 10 upon admission and received vitamin K, but INR 7 today.  CT: 11/30/12: Stable appearance of remote left MCA territory infarct.  No acute intracranial abnormality.  Referred for BSE per Stroke Protocol.   Assessment / Plan / Recommendation Clinical Impression  Min to moderate oral dysphagia marked by poor oral awareness, oral holding, reduced bolus cohesion, with subsequent pocketing right buccal area with regular solids. Max verbal and tactile cues required to complete tongue sweep and take sips of liquid to clear.  Total assist for  feeding secondary to lack of cooperation from patient.  Required max encouragement from RN to complete evaluation.  No s/s of aspiration noted throughout evaluation.  Recommend to proceed with dysphagia 3 (mechanical soft)  and thin liquids with full supervision with all meals due to noted cognitive deficits.  ST to follow briefly for diet tolerance due to other risk factors for aspiration of stroke and cognitive deficit.      Aspiration Risk  Mild  Diet Recommendation Dysphagia 3 (Mechanical Soft);Thin liquid   Liquid Administration via: Cup Medication Administration: Whole meds with puree Supervision: Staff feed patient;Full supervision/cueing for compensatory strategies Compensations: Slow rate;Small sips/bites;Check for pocketing;Follow solids with liquid Postural Changes and/or Swallow Maneuvers: Seated upright 90 degrees;Upright 30-60 min after meal    Other  Recommendations Oral Care Recommendations: Oral care QID Other Recommendations: Clarify dietary restrictions   Follow Up Recommendations  None    Frequency and Duration min 1 x/week  1 week       SLP Swallow  Goals Patient will utilize recommended strategies during swallow to increase swallowing safety with: Maximum assistance   Swallow Study Prior Functional Status   Lives at home with 24 hour care on regular diet and thin liquids    General Date of Onset: 11/29/12 HPI: DAIL MEECE is an 57 y.o. female with hypertension, diabetes mellitus, CHF-EF 10%, atrial fibrillation, stroke with residual right sided weakness and expressive aphasia, VT status post ICD placement, protein C deficiency, DVT on xarelto, hypothyroidism, asthma, who tonight was noted by the nursing staff to have a facial droop and complete right sided flaccidity. Rapid response was notified and then I was informed about the situation by the rapid response nurse. Exact time of onset is unclear. Of importance, she had INR 10 upon admission and received vitamin K, but INR 7 today.   Type of Study: Bedside swallow evaluation Diet Prior to this Study: NPO Temperature Spikes Noted: No Respiratory Status: Room air History of Recent Intubation: No Behavior/Cognition: Confused;Uncooperative;Lethargic Oral Cavity - Dentition: Missing dentition Self-Feeding Abilities: Total assist Patient Positioning: Upright in bed Baseline Vocal Quality: Clear Volitional Cough: Cognitively unable to elicit Volitional Swallow: Unable to elicit    Oral/Motor/Sensory Function Overall Oral Motor/Sensory Function: Impaired at baseline Labial ROM: Reduced right Labial Symmetry: Abnormal symmetry right Labial Strength: Reduced Labial Sensation: Reduced Lingual ROM: Reduced right Lingual Symmetry: Abnormal symmetry right Lingual Strength: Reduced Lingual Sensation: Reduced Facial ROM: Reduced right Facial Symmetry: Right droop Facial Strength: Reduced Facial Sensation: Reduced Velum: Within Functional Limits Mandible: Within Functional Limits   Ice Chips Ice chips: Not tested   Thin Liquid Thin Liquid: Within functional limits    Nectar Thick  Nectar Thick Liquid: Not tested   Honey Thick Honey Thick Liquid: Not tested   Puree Puree: Not tested Other Comments: patient refused   Solid   GO    Solid: Impaired Oral Phase Impairments: Impaired anterior to posterior transit;Poor awareness of bolus Oral Phase Functional Implications: Right lateral sulci pocketing;Oral holding      Moreen Fowler MS, CCC-SLP [phone] [facility] 12/01/2012,12:12 PM

## 2012-12-01 NOTE — Evaluation (Signed)
Physical Therapy Evaluation Patient Details Name: Elizabeth Love MRN: [id number] DOB: Feb 19, 1956 Today's Date: 12/01/2012 Time: 4696-2952 PT Time Calculation (min): 9 min  PT Assessment / Plan / Recommendation Clinical Impression  Patient is a 57 yo female admitted with headache and leg pain.  On 3/14 patient with increase in weakness on right side - stroke work-up in progress.  Patient very lethargic, opening eyes to verbal and tactile stimuli throughout session.  Dense Rt. hemiplegia present, impacting mobility.  At this point, patient not able to sit or stand without total assistance.  Will benefit from acute PT to maximize mobility and decrease burden of care prior to discharge.  Recommend SNF at discharge for continued therapy.    PT Assessment  Patient needs continued PT services    Follow Up Recommendations  SNF    Does the patient have the potential to tolerate intense rehabilitation      Barriers to Discharge Decreased caregiver support      Equipment Recommendations  None recommended by PT    Recommendations for Other Services     Frequency Min 3X/week    Precautions / Restrictions Precautions Precautions: Fall Restrictions Weight Bearing Restrictions: No   Pertinent Vitals/Pain       Mobility  Bed Mobility Bed Mobility: Rolling Right;Rolling Left Rolling Right: 1: +1 Total assist Rolling Left: 1: +1 Total assist Details for Bed Mobility Assistance: Verbal and tactile cues for technique to roll to right.  Attempted to have patient reach for rail with LUE - patient unable to hold rail.  Facilitated left hip/knee flexion to assist with rolling.  Patient reaching for LLE and moaning with this movement.  To roll to right, patient required total assist - did not attempt to initiate or assist with any movement.  Used bed pad to assist patient with rolling. Transfers Transfers: Not assessed Modified Rankin (Stroke Patients Only) Pre-Morbid Rankin Score: Moderate  disability (Per brother's description) Modified Rankin: Severe disability    Exercises     PT Diagnosis: Difficulty walking;Acute pain;Hemiplegia dominant side  PT Problem List: Decreased strength;Decreased activity tolerance;Decreased balance;Decreased mobility;Decreased coordination;Decreased cognition;Decreased knowledge of use of DME;Impaired tone;Impaired sensation;Pain PT Treatment Interventions: Functional mobility training;Therapeutic activities;Balance training;Neuromuscular re-education;Patient/family education   PT Goals Acute Rehab PT Goals PT Goal Formulation: With patient/family Time For Goal Achievement: 12/15/12 Potential to Achieve Goals: Fair Pt will Roll Supine to Right Side: with min assist;with rail PT Goal: Rolling Supine to Right Side - Progress: Goal set today Pt will Roll Supine to Left Side: with mod assist PT Goal: Rolling Supine to Left Side - Progress: Goal set today Pt will go Supine/Side to Sit: with mod assist PT Goal: Supine/Side to Sit - Progress: Goal set today Pt will Sit at Edge of Bed: with min assist;3-5 min;with bilateral upper extremity support PT Goal: Sit at Edge Of Bed - Progress: Goal set today Pt will go Sit to Supine/Side: with mod assist PT Goal: Sit to Supine/Side - Progress: Goal set today Pt will Transfer Bed to Chair/Chair to Bed: with max assist PT Transfer Goal: Bed to Chair/Chair to Bed - Progress: Goal set today  Visit Information  Last PT Received On: 12/01/12 Assistance Needed: +2    Subjective Data  Subjective: Patient with aphasia. Patient Stated Goal: Unable to state   Prior Functioning  Home Living Lives With: Family (brother) Available Help at Discharge: Family;Available 24 hours/day Type of Home: House Home Access: Level entry Home Layout: Two level;Bed/bath upstairs Alternate Level Stairs-Number of  Steps: flight Alternate Level Stairs-Rails: Right Bathroom Shower/Tub: Tub/shower unit Bathroom Toilet:  Standard Bathroom Accessibility: Yes How Accessible: Accessible via walker Home Adaptive Equipment: Shower chair with back;Grab bars in shower;Bedside commode/3-in-1;Quad cane;Wheelchair - manual Additional Comments: Brother provided information.  States someone comes in to help her with her bath. Prior Function Level of Independence: Independent with assistive device(s);Needs assistance (quad cane) Needs Assistance: Bathing;Meal Prep;Light Housekeeping Bath: Minimal Meal Prep: Maximal Light Housekeeping: Maximal Able to Take Stairs?: Yes (Sits on steps and bumps up/down stairs.) Driving: No Communication Communication: Expressive difficulties (Aphasia)    Cognition  Cognition Overall Cognitive Status: Difficult to assess Difficult to assess due to: Impaired communication;Level of arousal Arousal/Alertness: Lethargic Behavior During Session: Lethargic Cognition - Other Comments: Opened eyes briefly during session.    Extremity/Trunk Assessment Right Upper Extremity Assessment RUE ROM/Strength/Tone: Deficits RUE ROM/Strength/Tone Deficits: No movement noted, with decreased tone Left Upper Extremity Assessment LUE ROM/Strength/Tone: Deficits LUE ROM/Strength/Tone Deficits: Strength at least 3/5 - unable to MMT Right Lower Extremity Assessment RLE ROM/Strength/Tone: Deficits RLE ROM/Strength/Tone Deficits: No movement;  decreased tone Left Lower Extremity Assessment LLE ROM/Strength/Tone: Deficits LLE ROM/Strength/Tone Deficits: Strength at least 2/5 - minimal spontaneous movement noted.   Balance    End of Session PT - End of Session Activity Tolerance: Treatment limited secondary to medical complications (Comment) (Patient very lethargic, though improved over yesterday.) Patient left: in bed;with call bell/phone within reach;with bed alarm set Nurse Communication: Mobility status  GP     Vena Austria 12/01/2012, 2:03 PM Durenda Hurt. Renaldo Fiddler, [facility] Acute Rehab  Services Pager [phone]

## 2012-12-01 NOTE — Progress Notes (Signed)
Stroke Team Progress Note  HISTORY Elizabeth Love is an 57 y.o. female with hypertension, diabetes mellitus, CHF-EF 10%, atrial fibrillation, stroke with residual right sided weakness and expressive aphasia, VT status post ICD placement, protein C deficiency, DVT on xarelto, hypothyroidism, asthma, who tonight was noted by the nursing staff to have a facial droop and complete right sided flaccidity. Rapid response was notified and then I was informed about the situation by the rapid response nurse. Exact time of onset is unclear. Of importance, she had INR 10 upon admission and received vitamin K, but INR 7 today.  She underwent emergent CT brain that showed no acute ischemia or hemorrhage.    SUBJECTIVE No family is at bedside. The patient is more alert today, remains quite aphasic. The patient seems to be complaining of a headache. The leg pain is much improved.   OBJECTIVE Most recent Vital Signs: Filed Vitals:   11/30/12 1300 11/30/12 2100 12/01/12 0001 12/01/12 0612  BP: 152/86 105/68 109/75 104/72  Pulse: 84 74 75 75  Temp: 96.6 F (35.9 C) 98.6 F (37 C) 97 F (36.1 C) 97.6 F (36.4 C)  TempSrc: Oral  Oral   Resp: 18 16 16 16   Height:      Weight:      SpO2: 95% 96% 97% 95%   CBG (last 3)   Recent Labs  12/01/12 0155 12/01/12 0420 12/01/12 0736  GLUCAP 74 80 75    IV Fluid Intake:   . dextrose 40 mL/hr at 11/30/12 1853    MEDICATIONS  . amiodarone  400 mg Oral Daily  . levETIRAcetam  1,000 mg Intravenous Q12H  . metoprolol  2.5 mg Intravenous Q6H  . Rivaroxaban  20 mg Oral Q supper  . sodium chloride  3 mL Intravenous Q12H  . spironolactone  25 mg Oral Daily   PRN:  acetaminophen, acetaminophen, albuterol, ondansetron (ZOFRAN) IV, ondansetron  Diet:  NPO thin liquids Activity:  Up with assistance DVT Prophylaxis:  Xarelto  CLINICALLY SIGNIFICANT STUDIES Basic Metabolic Panel:  Recent Labs Lab 11/27/12 1815 11/28/12 0400  11/30/12 1856  12/01/12 0500 12/01/12 0712  NA 140 141  < > 139 137  --   K 3.4* 3.3*  < > 5.0 6.1* 5.1  CL 103 103  < > 104 102  --   CO2 24 25  < > 23 22  --   GLUCOSE 87 152*  < > 82 77  --   BUN 18 19  < > 28* 29*  --   CREATININE 0.79 0.91  < > 0.98 0.99  --   CALCIUM 9.4 9.5  < > 9.4 9.5  --   MG  --  1.7  --   --   --   --   PHOS  --  3.0  --   --   --   --   < > = values in this interval not displayed. Liver Function Tests:   Recent Labs Lab 11/28/12 0400 11/30/12 1856  AST 24 44*  ALT 10 17  ALKPHOS 70 83  BILITOT 1.7* 1.7*  PROT 8.2 7.5  ALBUMIN 3.1* 2.8*   CBC:  Recent Labs Lab 11/27/12 1815  11/30/12 1856 12/01/12 0500  WBC 6.9  < > 6.9 7.7  NEUTROABS 5.0  --   --   --   HGB 9.5*  < > 8.7* 8.8*  HCT 29.7*  < > 27.3* 27.1*  MCV 82.5  < > 82.7 81.1  PLT 266  < > 238 294  < > = values in this interval not displayed. Coagulation:   Recent Labs Lab 11/28/12 0052 11/29/12 0418 11/30/12 0455 12/01/12 0500  LABPROT 67.9* 59.5* 76.3* 64.4*  INR 9.22* 7.71* 10.79* 8.58*   Cardiac Enzymes:   Recent Labs Lab 11/28/12 0400 11/28/12 1115 11/28/12 1519  TROPONINI <0.30 <0.30 <0.30   Urinalysis:   Recent Labs Lab 11/27/12 2249  COLORURINE YELLOW  LABSPEC 1.017  PHURINE 6.0  GLUCOSEU NEGATIVE  HGBUR NEGATIVE  BILIRUBINUR SMALL*  KETONESUR NEGATIVE  PROTEINUR 30*  UROBILINOGEN 2.0*  NITRITE NEGATIVE  LEUKOCYTESUR TRACE*   Lipid Panel No results found for this basename: chol,  trig,  hdl,  cholhdl,  vldl,  ldlcalc   HgbA1C  Lab Results  Component Value Date   HGBA1C 6.3* 11/06/2012    Urine Drug Screen:     Component Value Date/Time   LABOPIA NONE DETECTED 07/19/2012 2324   COCAINSCRNUR NONE DETECTED 07/19/2012 2324   LABBENZ POSITIVE* 07/19/2012 2324   AMPHETMU NONE DETECTED 07/19/2012 2324   THCU NONE DETECTED 07/19/2012 2324   LABBARB NONE DETECTED 07/19/2012 2324    Alcohol Level: No results found for this basename: ETH,  in the last 168  hours  Ct Head Wo Contrast  11/30/2012  *RADIOLOGY REPORT*  Clinical Data: Stroke.  CT HEAD WITHOUT CONTRAST  Technique:  Contiguous axial images were obtained from the base of the skull through the vertex without contrast.  Comparison: CT head without contrast 11/29/2012  Findings: A remote left MCA territory infarct is not significantly changed.  Ex vacuo dilation of the left lateral ventricle is again noted.  There is no hemorrhage or mass lesion.  The right ventricle is of normal size.  No significant extra-axial fluid collection is present.  The paranasal sinuses and mastoid air cells are clear.  The osseous skull is intact.  IMPRESSION:  1.  Stable appearance of remote left MCA territory infarct. 2.  No acute intracranial abnormality or significant interval change over last several exams.   Original Report Authenticated By: Marin Roberts, M.D.    Ct Head Wo Contrast  11/29/2012  *RADIOLOGY REPORT*  Clinical Data: New onset right-sided flaccidness.  History of strokes.  CT HEAD WITHOUT CONTRAST  Technique:  Contiguous axial images were obtained from the base of the skull through the vertex without contrast.  Comparison: 11/27/2012  Findings: There is a large area of encephalomalacia in the left cerebral hemisphere involving posterior frontal, temporal, and parietal lobes.  This is stable since the previous study and is consistent with old infarct.  There is diffuse cerebral atrophy. Asymmetric dilatation of the left lateral ventricle is consistent with negative mass effect relating to the old infarct.  No acute mass effect or midline shift.  No abnormal extra-axial fluid collections.  Remaining gray-white matter junctions are distinct. Basal cisterns are not effaced.  Scattered low attenuation changes in the deep white matter most consistent with small vessel ischemia.  The no evidence of acute intracranial hemorrhage.  No depressed skull fractures.  Visualized paranasal sinuses and mastoid air  cells are not opacified.  Vascular calcifications. Generally stable appearance of the intracranial contents since previous study.  IMPRESSION: Old left MCA distribution infarct.  Diffuse atrophy and small vessel ischemic changes.  No acute intracranial abnormalities.   Original Report Authenticated By: Burman Nieves, M.D.     CT of the brain    IMPRESSION:  1. No acute intracranial abnormality or significant interval  change.  2. Remote left MCA territory infarct.  MRI of the brain    MRA of the brain    2D Echocardiogram    Carotid Doppler    CXR    EKG   Normal sinus rhythm Low voltage QRS Nonspecific T wave abnormality Prolonged QT Abnormal ECG  Therapy Recommendations   Physical Exam    The patient is alert and cooperative, but she is aphasic.  Neurologic exam reveals full extraocular movements. Visual fields are difficult to test, she blinks to threat from the left, less well from the right.  Motor testing reveals good strength of the left extremities. On the right, there is severe weakness, decreased motor tone on the arm and leg.  The patient was unable to cooperate for cerebellar testing. Gait was not tested.  Deep tendon reflexes are symmetric, seem generally brisk.    ASSESSMENT Ms. AYME SHORT is a 57 y.o. female presenting with increased right HP.   On Xarelto prior to admission. INR remains significantly elevated, 10.79.   Atrial fibrillation, on anticoagulation  Protein C deficiency  Cardiomyopathy  Chronic left MCA CVA, with residual right HP, and aphasia  Hospital day # 4  Repeat CT without acute changes.  TREATMENT/PLAN  The patient is on Xarelto  EEG study, rule out seizure  Carotid doppler  Will follow  12/01/2012 9:58 AM  I have personally obtained a history, examined the patient, evaluated imaging results, and formulated the assessment and plan of care. I agree with the above. Lesly Dukes

## 2012-12-01 NOTE — Progress Notes (Signed)
*  PRELIMINARY RESULTS* Vascular Ultrasound Carotid Duplex (Doppler) has been completed.   There is no obvious evidence of hemodynamically significant internal carotid artery stenosis bilaterally. Vertebral arteries are patent with antegrade flow.  12/01/2012 11:06 AM Gertie Fey, RDMS, RDCS

## 2012-12-01 NOTE — Progress Notes (Signed)
[facility] PROGRESS NOTE  Elizabeth Love ZOX:[id number] DOB: 22-Mar-1956 DOA: 11/27/2012  PCP: [name], [name], MD  Brief HPI: Elizabeth Love is a 57 y.o. female who has a past medical history of Seizures; Hypertension; Stroke; Asthma; Diabetes mellitus; Renal disorder; Chronic systolic CHF (congestive heart failure); Atrial fibrillation; Coagulopathy; History of DVT (deep vein thrombosis); Noncompliance; Hypothyroidism; VT (ventricular tachycardia); and Protein C deficiency. She presented with Hx of headache for long time and leg swelling. She is not able to provide any specifics. Note patient has a very complex medical history; she has history of nonischemic cardiomyopathy with EF of 10% status post AICD placement in 2007 in [place]. 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 It is not quite clear what her baseline mental status is, no family was available. She was brought in by ambulance. She also has a history of atrial fibrillation. She was not thought to be a good candidate for anticoagulation due to noncompliance. But in November 2013 she developed DVT with subsequent pulmonary embolism and was found to have protein C deficiency. She had an IVC filter placement done and was started on Xarelto which is still taking. Of note she has long-standing history of headaches which has been worked up in the past apparently the current CT scan is unremarkable. Chest history of chronic pain and also has trouble verbalizing her symptoms which makes her history taking very difficult. While in ER she had been pointing to her chest occasionally stating that she has some chest pain her troponin was obtained which was slightly elevated at 0.1. Her EKG is unchanged. The past she had had positive troponins which were thought to be secondary to cardiac strain due to CHF. Of note her INR was also  checked although she's not a Coumadin it was shown to be greater than 10. This was discussed with pharmacist and they stated that INR is not an accurate Love in monitoring of Xarelto and is routinely severely elevated nonetheless they will followup all patient is here. Patient is complaining of leg pain but her legs do not appear to be swollen. In fact she does not showing signs of fluid overload.   Past medical history:  Past Medical History  Diagnosis Date  . Seizures   . Hypertension   . Stroke     2007 - R sided weakness and expressive aphasia with h/o behavioral problems related to this  . Asthma   . Diabetes mellitus   . 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 Chronic systolic CHF (congestive heart failure)     a. s/p AICD ~2007 ([employer]. Jude) - previous care [place]. No known hx CAD. b. EF 10% by echo 04/2012.  Marland Kitchen Atrial fibrillation     04/2012 - not felt to be an anticoag candidate long term at that time, but later had DVT so was on placed on Xarelto  . Coagulopathy     "Protein C deficiency"  . History of DVT (deep vein thrombosis)     a. 06/2012: RLE DVT, + protein C deficiency, placed on Xarelto. s/p IVC filter  . Noncompliance     Due to mental status, family has had to coax her to take medicines  . Hypothyroidism   . VT (ventricular tachycardia)     a. h/o ICD ~2007-2008. b. Adm 08/2012 with UTI, had VT/ICD shock x 5 (hypokalemic)  .  Protein C deficiency     Consultants: Telephone discussion with Dr. Arline Asp  Procedures: None  Antibiotics: None  Subjective: Patient remains lethargic but more responsive today. Due to previous stroke it is difficult to communicate with patient.   Objective: Vital Signs  Filed Vitals:   11/30/12 1300 11/30/12 2100 12/01/12 0001 12/01/12 0612  BP: 152/86 105/68 109/75 104/72  Pulse: 84 74 75 75  Temp: 96.6 F (35.9 C) 98.6 F (37 C) 97 F (36.1 C) 97.6 F (36.4 C)  TempSrc: Oral  Oral   Resp: 18 16 16 16   Height:       Weight:      SpO2: 95% 96% 97% 95%    Intake/Output Summary (Last 24 hours) at 12/01/12 1203 Last data filed at 12/01/12 0500  Gross per 24 hour  Intake 198.67 ml  Output    100 ml  Net  98.67 ml   Filed Weights   11/30/12 0923  Weight: 52.164 kg (115 lb)     General appearance: alert, distracted and crying spells Head: Normocephalic, without obvious abnormality, atraumatic Resp: clear to auscultation bilaterally Cardio: S1, S2 normal, no S3 or S4, systolic murmur: early systolic 3/6, blowing throughout the precordium, no click and no rub GI: soft, non-tender; bowel sounds normal; no masses,  no organomegaly Extremities: no obvious lesions noted either LE. Mild swelling of right ankle with tenderness to movement.  Pulses: 2+ and symmetric Skin: Skin color, texture, turgor normal. No rashes or lesions Neurologic: No appreciable change from before except maybe slightly more lethargic and increased right weakness. She did have right weakness before as well.  Lab Results:  Basic Metabolic Panel:  Recent Labs Lab 11/27/12 1815 11/28/12 0400 11/29/12 0448 11/30/12 1856 12/01/12 0500 12/01/12 0712  NA 140 141 142 139 137  --   K 3.4* 3.3* 4.2 5.0 6.1* 5.1  CL 103 103 104 104 102  --   CO2 24 25 25 23 22   --   GLUCOSE 87 152* 126* 82 77  --   BUN 18 19 21  28* 29*  --   CREATININE 0.79 0.91 0.91 0.98 0.99  --   CALCIUM 9.4 9.5 9.6 9.4 9.5  --   MG  --  1.7  --   --   --   --   PHOS  --  3.0  --   --   --   --    Liver Function Tests:  Recent Labs Lab 11/27/12 1815 11/28/12 0400 11/30/12 1856  AST 22 24 44*  ALT 10 10 17   ALKPHOS 67 70 83  BILITOT 1.7* 1.7* 1.7*  PROT 7.6 8.2 7.5  ALBUMIN 3.0* 3.1* 2.8*   CBC:  Recent Labs Lab 11/27/12 1815 11/28/12 0400 11/30/12 1856 12/01/12 0500  WBC 6.9 7.3 6.9 7.7  NEUTROABS 5.0  --   --   --   HGB 9.5* 9.7* 8.7* 8.8*  HCT 29.7* 31.2* 27.3* 27.1*  MCV 82.5 84.1 82.7 81.1  PLT 266 297 238 294   Cardiac  Enzymes:  Recent Labs Lab 11/28/12 0400 11/28/12 1115 11/28/12 1519  TROPONINI <0.30 <0.30 <0.30   BNP (last 3 results)  Recent Labs  11/05/12 1607 11/06/12 0825 11/27/12 1821  PROBNP 1906.0* 12374.0* 9596.0*   CBG:  Recent Labs Lab 11/30/12 2211 11/30/12 2358 12/01/12 0155 12/01/12 0420 12/01/12 0736  GLUCAP 127* 88 74 80 75    No results found for this or any previous visit (from the past 240 hour(s)).  Studies/Results: Ct Head Wo Contrast  11/30/2012  *RADIOLOGY REPORT*  Clinical Data: Stroke.  CT HEAD WITHOUT CONTRAST  Technique:  Contiguous axial images were obtained from the base of the skull through the vertex without contrast.  Comparison: CT head without contrast 11/29/2012  Findings: A remote left MCA territory infarct is not significantly changed.  Ex vacuo dilation of the left lateral ventricle is again noted.  There is no hemorrhage or mass lesion.  The right ventricle is of normal size.  No significant extra-axial fluid collection is present.  The paranasal sinuses and mastoid air cells are clear.  The osseous skull is intact.  IMPRESSION:  1.  Stable appearance of remote left MCA territory infarct. 2.  No acute intracranial abnormality or significant interval change over last several exams.   Original Report Authenticated By: Marin Roberts, M.D.    Ct Head Wo Contrast  11/29/2012  *RADIOLOGY REPORT*  Clinical Data: New onset right-sided flaccidness.  History of strokes.  CT HEAD WITHOUT CONTRAST  Technique:  Contiguous axial images were obtained from the base of the skull through the vertex without contrast.  Comparison: 11/27/2012  Findings: There is a large area of encephalomalacia in the left cerebral hemisphere involving posterior frontal, temporal, and parietal lobes.  This is stable since the previous study and is consistent with old infarct.  There is diffuse cerebral atrophy. Asymmetric dilatation of the left lateral ventricle is consistent with  negative mass effect relating to the old infarct.  No acute mass effect or midline shift.  No abnormal extra-axial fluid collections.  Remaining gray-white matter junctions are distinct. Basal cisterns are not effaced.  Scattered low attenuation changes in the deep white matter most consistent with small vessel ischemia.  The no evidence of acute intracranial hemorrhage.  No depressed skull fractures.  Visualized paranasal sinuses and mastoid air cells are not opacified.  Vascular calcifications. Generally stable appearance of the intracranial contents since previous study.  IMPRESSION: Old left MCA distribution infarct.  Diffuse atrophy and small vessel ischemic changes.  No acute intracranial abnormalities.   Original Report Authenticated By: Burman Nieves, M.D.     Medications:  Scheduled: . amiodarone  400 mg Oral Daily  . levETIRAcetam  1,000 mg Intravenous Q12H  . metoprolol  2.5 mg Intravenous Q6H  . Rivaroxaban  20 mg Oral Q supper  . sodium chloride  3 mL Intravenous Q12H  . spironolactone  25 mg Oral Daily   Continuous: . dextrose 40 mL/hr at 11/30/12 1853   ZOX:[id number], acetaminophen, albuterol, ondansetron (ZOFRAN) IV, ondansetron  Assessment/Plan:  Active Problems:   Atrial fibrillation   Cardiomyopathy   Hypokalemia   Protein C deficiency   Acute on chronic systolic CHF (congestive heart failure)   Chest pain at rest   Headache    Apparent new Right facial droop and increased Right weakness It's possible she had a new event. She does have a history of atrial fibrillation. Ct showed old stroke. MRI cannot be done due to AICD. Neurology following. EEg is pending. On Xarelto.   Chest pain at rest EKG's do not show acute ischemic changes. She has ruled out for ACS. Patient does not have any history of coronary artery disease. She has nonischemic cardiomyopathy with an EF of 10%. She appears to be well compensated currently. No further work up at this time.   Leg  pain/Possible Gout X rays did not reveal anything acute. Uric acid was significantly high. Given one dose of colchicine yesterday. See PN  from 3/13. Continue to monitor. Use tylenol as needed.  History of DVT/PE and Protein C deficiency INR is elevated. INR and PT levels are not used for therapeutic monitoring in those on Xarelto. Anti factor xa is preferable. Continue Rivaroxaban, would not use INR to monitor. At this point is no evidence of bleeding. Discussed with Dr. Arline Asp 3/13. No clear explanation for her presentation. Could have Vit K deficiency and so was given same. Will give another dose today. This will not interfere with Rivaroxaban and it's effects. Will stop checking daily INR's now, especially as there is no bleeding.   Hypokalemia K level now borderline high. Will stop spironolactone. Repeat tomorrow.  History of Atrial fibrillation Stable. Continue home medications   Non-ischemic Cardiomyopathy  Currently appears to be euvolemic. Cntinue Lasix   Headache This is chronic. CT scan unremarkable. Treat symptomatically.   Nutrition Was made NPO due to lethargy yesterday. More responsive today. Will start Dys 3 diet. If she does well, will resume her oral meds in AM.  Code Status Full Code  DVT Prophylaxis On Rivaroxaban  Family Communication: None at bedside  Disposition Plan: Unclear for now due to new event. Will get PT/OT eval.    LOS: 4 days   [facility]  [facility] Pager [phone] 12/01/2012, 12:03 PM  If 8PM-8AM, please contact night-coverage at [email or web address], password [email or web address]

## 2012-12-02 ENCOUNTER — Inpatient Hospital Stay (HOSPITAL_COMMUNITY): Payer: Medicare Other

## 2012-12-02 LAB — GLUCOSE, CAPILLARY
Glucose-Capillary: 86 mg/dL (ref 70–99)
Glucose-Capillary: 108 mg/dL — ABNORMAL HIGH (ref 70–99)

## 2012-12-02 MED ORDER — ALPRAZOLAM 0.5 MG PO TABS: 0.5000 mg | ORAL_TABLET | Freq: Every evening | ORAL | Status: DC | PRN

## 2012-12-02 MED ORDER — PANTOPRAZOLE SODIUM 40 MG PO TBEC
40.0000 mg | DELAYED_RELEASE_TABLET | Freq: Every day | ORAL | Status: DC
  Administered 2012-12-02 – 2012-12-04 (×3): 40 mg via ORAL
  Filled 2012-12-02 (×3): qty 1

## 2012-12-02 MED ORDER — METOPROLOL TARTRATE 25 MG PO TABS
25.0000 mg | ORAL_TABLET | Freq: Two times a day (BID) | ORAL | Status: DC
  Administered 2012-12-02 – 2012-12-04 (×4): 25 mg via ORAL
  Filled 2012-12-02 (×5): qty 1

## 2012-12-02 MED ORDER — LEVETIRACETAM 500 MG PO TABS
1000.0000 mg | ORAL_TABLET | Freq: Two times a day (BID) | ORAL | Status: DC
  Administered 2012-12-02 – 2012-12-04 (×4): 1000 mg via ORAL
  Filled 2012-12-02 (×5): qty 2

## 2012-12-02 MED ORDER — SPIRONOLACTONE 25 MG PO TABS
25.0000 mg | ORAL_TABLET | Freq: Every day | ORAL | Status: DC
  Administered 2012-12-02 – 2012-12-04 (×3): 25 mg via ORAL
  Filled 2012-12-02 (×3): qty 1

## 2012-12-02 MED ORDER — LEVOTHYROXINE SODIUM 50 MCG PO TABS
50.0000 ug | ORAL_TABLET | Freq: Every day | ORAL | Status: DC
  Administered 2012-12-03 – 2012-12-04 (×2): 50 ug via ORAL
  Filled 2012-12-02 (×3): qty 1

## 2012-12-02 MED ORDER — QUETIAPINE FUMARATE ER 50 MG PO TB24
50.0000 mg | ORAL_TABLET | Freq: Every day | ORAL | Status: DC
  Administered 2012-12-03 – 2012-12-04 (×2): 50 mg via ORAL
  Filled 2012-12-02 (×2): qty 1

## 2012-12-02 MED ORDER — ASPIRIN EC 81 MG PO TBEC
81.0000 mg | DELAYED_RELEASE_TABLET | Freq: Every day | ORAL | Status: DC
  Administered 2012-12-02 – 2012-12-04 (×3): 81 mg via ORAL
  Filled 2012-12-02 (×3): qty 1

## 2012-12-02 MED ORDER — ENSURE COMPLETE PO LIQD
237.0000 mL | Freq: Two times a day (BID) | ORAL | Status: DC
  Administered 2012-12-02 – 2012-12-04 (×3): 237 mL via ORAL

## 2012-12-02 MED ORDER — ESCITALOPRAM OXALATE 10 MG PO TABS
10.0000 mg | ORAL_TABLET | Freq: Every day | ORAL | Status: DC
  Administered 2012-12-02 – 2012-12-04 (×3): 10 mg via ORAL
  Filled 2012-12-02 (×3): qty 1

## 2012-12-02 NOTE — Progress Notes (Signed)
Referral received today for SNF placement. Met with pt to discuss SNF placement today. Full assessment to follow.   Sherald Barge, LCSW-A Clinical Social Worker [phone]

## 2012-12-02 NOTE — Progress Notes (Addendum)
[facility] PROGRESS NOTE  [name]:[id number] DOB: Sep 13, 1956 DOA: 11/27/2012  PCP: [name], [name], MD  Brief HPI: Elizabeth Love is a 57 y.o. female who has a past medical history of Seizures; Hypertension; Stroke; Asthma; Diabetes mellitus; Renal disorder; Chronic systolic CHF (congestive heart failure); Atrial fibrillation; Coagulopathy; History of DVT (deep vein thrombosis); Noncompliance; Hypothyroidism; VT (ventricular tachycardia); and Protein C deficiency. She presented with Hx of headache for long time and leg swelling. She is not able to provide any specifics. Note patient has a very complex medical history; she has history of nonischemic cardiomyopathy with EF of 10% status post AICD placement in 2007 in [place]. 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 It is not quite clear what her baseline mental status is, no family was available. She was brought in by ambulance. She also has a history of atrial fibrillation. She was not thought to be a good candidate for anticoagulation due to noncompliance. But in November 2013 she developed DVT with subsequent pulmonary embolism and was found to have protein C deficiency. She had an IVC filter placement done and was started on Xarelto which is still taking. Of note she has long-standing history of headaches which has been worked up in the past apparently the current CT scan is unremarkable. Chest history of chronic pain and also has trouble verbalizing her symptoms which makes her history taking very difficult. While in ER she had been pointing to her chest occasionally stating that she has some chest pain her troponin was obtained which was slightly elevated at 0.1. Her EKG is unchanged. The past she had had positive troponins which were thought to be secondary to cardiac strain due to CHF. Of note her INR was also  checked although she's not a Coumadin it was shown to be greater than 10. This was discussed with pharmacist and they stated that INR is not an accurate test in monitoring of Xarelto and is routinely severely elevated nonetheless they will followup all patient is here. Patient is complaining of leg pain but her legs do not appear to be swollen. In fact she does not showing signs of fluid overload.   Past medical history:  Past Medical History  Diagnosis Date  . Seizures   . Hypertension   . Stroke     2007 - R sided weakness and expressive aphasia with h/o behavioral problems related to this  . Asthma   . Diabetes mellitus   . 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 Chronic systolic CHF (congestive heart failure)     a. s/p AICD ~2007 ([employer]. Jude) - previous care [place]. No known hx CAD. b. EF 10% by echo 04/2012.  Marland Kitchen Atrial fibrillation     04/2012 - not felt to be an anticoag candidate long term at that time, but later had DVT so was on placed on Xarelto  . Coagulopathy     "Protein C deficiency"  . History of DVT (deep vein thrombosis)     a. 06/2012: RLE DVT, + protein C deficiency, placed on Xarelto. s/p IVC filter  . Noncompliance     Due to mental status, family has had to coax her to take medicines  . Hypothyroidism   . VT (ventricular tachycardia)     a. h/o ICD ~2007-2008. b. Adm 08/2012 with UTI, had VT/ICD shock x 5 (hypokalemic)  .  Protein C deficiency     Consultants: Telephone discussion with Dr. Arline Asp, Neurology  Procedures: None  Antibiotics: None  Subjective: Patient awake and alert today. Behavior is same with periodic crying spells. Due to previous stroke it is difficult to communicate with patient.   Objective: Vital Signs  Filed Vitals:   12/01/12 2315 12/02/12 0500 12/02/12 0807 12/02/12 1230  BP: 113/78 96/62 117/81 131/83  Pulse: 74 67 71 86  Temp:  98.3 F (36.8 C) 98.3 F (36.8 C) 98.3 F (36.8 C)  TempSrc:   Oral Oral  Resp:  18 18  18   Height:      Weight:      SpO2:  95% 91% 93%    Intake/Output Summary (Last 24 hours) at 12/02/12 1254 Last data filed at 12/02/12 0900  Gross per 24 hour  Intake 2004.67 ml  Output      0 ml  Net 2004.67 ml   Filed Weights   11/30/12 0923  Weight: 52.164 kg (115 lb)     General appearance: alert, distracted and crying spells Head: Normocephalic, without obvious abnormality, atraumatic Resp: clear to auscultation bilaterally Cardio: S1, S2 normal, no S3 or S4, systolic murmur: early systolic 3/6, blowing throughout the precordium, no click and no rub GI: soft, non-tender; bowel sounds normal; no masses,  no organomegaly Extremities: no obvious lesions noted either LE. Mild swelling of right ankle with tenderness to movement.  Pulses: 2+ and symmetric Skin: Skin color, texture, turgor normal. No rashes or lesions Neurologic: Sitting on side of bed. Right hemiparesis which is chronic.  Lab Results:  Basic Metabolic Panel:  Recent Labs Lab 11/27/12 1815 11/28/12 0400 11/29/12 0448 11/30/12 1856 12/01/12 0500 12/01/12 0712  NA 140 141 142 139 137  --   K 3.4* 3.3* 4.2 5.0 6.1* 5.1  CL 103 103 104 104 102  --   CO2 24 25 25 23 22   --   GLUCOSE 87 152* 126* 82 77  --   BUN 18 19 21  28* 29*  --   CREATININE 0.79 0.91 0.91 0.98 0.99  --   CALCIUM 9.4 9.5 9.6 9.4 9.5  --   MG  --  1.7  --   --   --   --   PHOS  --  3.0  --   --   --   --    Liver Function Tests:  Recent Labs Lab 11/27/12 1815 11/28/12 0400 11/30/12 1856  AST 22 24 44*  ALT 10 10 17   ALKPHOS 67 70 83  BILITOT 1.7* 1.7* 1.7*  PROT 7.6 8.2 7.5  ALBUMIN 3.0* 3.1* 2.8*   CBC:  Recent Labs Lab 11/27/12 1815 11/28/12 0400 11/30/12 1856 12/01/12 0500  WBC 6.9 7.3 6.9 7.7  NEUTROABS 5.0  --   --   --   HGB 9.5* 9.7* 8.7* 8.8*  HCT 29.7* 31.2* 27.3* 27.1*  MCV 82.5 84.1 82.7 81.1  PLT 266 297 238 294   Cardiac Enzymes:  Recent Labs Lab 11/28/12 0400 11/28/12 1115 11/28/12 1519   TROPONINI <0.30 <0.30 <0.30   BNP (last 3 results)  Recent Labs  11/05/12 1607 11/06/12 0825 11/27/12 1821  PROBNP 1906.0* 12374.0* 9596.0*   CBG:  Recent Labs Lab 12/01/12 2011 12/01/12 2323 12/02/12 0356 12/02/12 0730 12/02/12 1214  GLUCAP 94 86 92 82 82    No results found for this or any previous visit (from the past 240 hour(s)).    Studies/Results: No results found.  Medications:  Scheduled: . amiodarone  400 mg Oral Daily  . levETIRAcetam  1,000 mg Intravenous Q12H  . metoprolol  2.5 mg Intravenous Q6H  . Rivaroxaban  20 mg Oral Q supper  . sodium chloride  3 mL Intravenous Q12H   Continuous: . dextrose 40 mL/hr at 12/01/12 2100   YQM:[id number], acetaminophen, albuterol, ondansetron (ZOFRAN) IV, ondansetron  Assessment/Plan:  Active Problems:   Atrial fibrillation   Cardiomyopathy   Hypokalemia   Protein C deficiency   Acute on chronic systolic CHF (congestive heart failure)   Chest pain at rest   Headache    Apparent new Right facial droop and increased Right weakness It's possible she had a new event. No stroke on multiple CT's. Could have been seizure. EEG pending. Lethargy could have been from narcotics. She does have a history of atrial fibrillation. Ct showed old stroke. MRI cannot be done due to AICD. Neurology following. On Xarelto. It doesn't appear she had a stroke. It remains unclear why she has not been on a statin for her previous stroke. I have reviewed all records in Epic and find no mention of a statin. If neurology thinks she had a stroke I will initiate a statin but they don't I hesitate to do so without knowing why she wasn't on one to begin with.  Chest pain at rest EKG's do not show acute ischemic changes. She has ruled out for ACS. Patient does not have any history of coronary artery disease. She has nonischemic cardiomyopathy with an EF of 10%. She appears to be well compensated currently. No further work up at this time.  Reason for pain not clear as she is a poor historian.  Leg pain/Possible Gout X rays did not reveal anything acute. Uric acid was significantly high. Given one dose of colchicine yesterday. See PN from 3/13. Continue to monitor. Use tylenol as needed.  History of DVT/PE and Protein C deficiency INR is elevated. INR and PT levels are not used for therapeutic monitoring in those on Xarelto. Anti factor xa is preferable. Continue Rivaroxaban, would not use INR to monitor. At this point is no evidence of bleeding. Discussed with Dr. Arline Asp 3/13. No clear explanation for her presentation. Could have Vit K deficiency and so was given same. Will give another dose today. This will not interfere with Rivaroxaban and it's effects. Will stop checking daily INR's now, especially as there is no bleeding.   Hypokalemia K level now borderline high. Will stop spironolactone. Patient refused labs earlier today. Agreeable now.  History of Atrial fibrillation Stable. Continue home medications   Non-ischemic Cardiomyopathy  Currently appears to be euvolemic. Continue Lasix   Headache This is chronic. CT scan unremarkable. Treat symptomatically.   Nutrition Tolerating diet. Will resume her home medications.   History of DM2 Was on oral agents at home. Check CBG's. Has been hypoglycemic occasionally. Monitor. HBA1C was 6.3 in Feb.  Code Status Full Code  DVT Prophylaxis On Rivaroxaban  Family Communication: None at bedside  Disposition Plan: SNF recommended. Will pursue. Anticipate discharge 3/18 if EEG is unremarkable.    LOS: 5 days   [facility]  [facility] Pager [phone] 12/02/2012, 12:54 PM  If 8PM-8AM, please contact night-coverage at [email or web address], password [facility]

## 2012-12-02 NOTE — Progress Notes (Signed)
Inpatient Diabetes Program Recommendations  AACE/ADA: New Consensus Statement on Inpatient Glycemic Control (2013)  Target Ranges:  Prepandial:   less than 140 mg/dL      Peak postprandial:   less than 180 mg/dL (1-2 hours)      Critically ill patients:  140 - 180 mg/dL   Reason for Assessment: Hypoglycemia  Note:  The American Diabetes Association recommends that patients with diabetes have CBG's checked at least four times a day while in the hospital setting.  Please order CBG's ac & hs while eating or if patient becomes NPO, every 4 hours.   MD, please add a diagnosis of diabetes to the Hospital Problem List.  Thank you.  Elizabeth Love S. AmeLie Hollars, RN, CNS, CDE

## 2012-12-02 NOTE — Clinical Documentation Improvement (Signed)
CHEST PAIN DOCUMENTATION CLARIFICATION QUERY  THIS DOCUMENT IS NOT A PERMANENT PART OF THE MEDICAL RECORD  TO RESPOND TO THE THIS QUERY, FOLLOW THE INSTRUCTIONS BELOW:  1. If needed, update documentation for the patient's encounter via the notes activity.  2. Access this query again and click edit on the In [employer].  3. After updating, or not, click F2 to complete all highlighted (required) fields concerning your review. Select "additional documentation in the medical record" OR "no additional documentation provided".  4. Click Sign note button.  5. The deficiency will fall out of your In Basket *Please let us know if you are not able to complete this workflow by phone or e-mail (listed below).          12/02/12  Dear Dr. Rito Ehrlich  Marton Redwood  In an effort to better capture your patient's severity of illness/SOI, risk of mortality/ROM, reflect appropriate length of stay/LOS and utilization of resources, a review of the patient medical record has revealed the following indicators.    CURRENTLY PATIENT'S PDX IS "CHEST PAIN" WHICH IS A SYMPTOM AND LOW WEIGHTED. PLEASE INDICATE IN NOTES AND DC SUMMARY LIKELY/POSSIBLE/SUSPECTED/PRESUMED UNDERLYING CAUSE OF "CHEST PAIN" TO CLARIFY PATIENT'S SEVERITY OF ILLNESS/RISK OF MORTALITY.  THANK YOU.  Possible Clinical Conditions?   Based on your clinical judgment, please clarify cause of Chest Pain: - Cardiomyopathy - Unstable Angina  -  Atrial fib - Musculoskeletal Chest Pain - Pleuritic Chest Pain - GERD - Other Condition (please specify)   Supporting Information: - Risk Factors: NICM w/EF 10%, expressive aphasia from prior CVA, noncompliance, Protein C deficiency coagulopathy, behavior problems related to prior CVA, current probably new CVA, Afib, "past she had positive troponins which were thought to be secondary to cardiac strain due to CHF"  - Signs & Symptoms: Chest Pain at rest, Headache, leg swelling - Diagnostics:  slight elevation of Troponins on admission 0.10 - EKG : "do not show acute ischemic changes" - Treatment: tele, serial enzymes/EKG, management elevated INR :  You may use possible, probable, or suspect with inpatient documentation. possible, probable, suspected diagnoses MUST be documented at the time of discharge  Reviewed: additional documentation in the medical record  Thank You,  Beverley Fiedler RN BSN Clinical Documentation Specialist: Tele Contact: [phone]  Health Information Management [place]

## 2012-12-02 NOTE — Progress Notes (Signed)
Pt refusing all AM labs.  MD notified.  Will continue to monitor. [place], Mitzi Hansen

## 2012-12-02 NOTE — Progress Notes (Signed)
EEG completed.

## 2012-12-02 NOTE — Progress Notes (Signed)
Physical Therapy Treatment Patient Details Name: Elizabeth Love MRN: [id number] DOB: 08-Mar-1956 Today's Date: 12/02/2012 Time: 4098-1191 PT Time Calculation (min): 23 min  PT Assessment / Plan / Recommendation Comments on Treatment Session  Pt needed maximal encouragement of an extended period to get patient to participate, but once up, particpated well for the minimal agreed upon time.  Gait was paretic, but stable, sitting balance safe at SBA    Follow Up Recommendations  SNF     Does the patient have the potential to tolerate intense rehabilitation     Barriers to Discharge        Equipment Recommendations  None recommended by PT    Recommendations for Other Services    Frequency Min 3X/week   Plan Discharge plan remains appropriate;Frequency remains appropriate    Precautions / Restrictions Precautions Precautions: Fall Restrictions Weight Bearing Restrictions: No   Pertinent Vitals/Pain     Mobility  Bed Mobility Bed Mobility: Supine to Sit;Sitting - Scoot to Edge of Bed Supine to Sit: 3: Mod assist Sitting - Scoot to Edge of Bed: 3: Mod assist Details for Bed Mobility Assistance: pt resistant to participation, but assist once coersed to get up. Transfers Transfers: Stand to Sit;Sit to Stand Sit to Stand: 1: +2 Total assist;Without upper extremity assist;From bed Sit to Stand: Patient Percentage: 40% Stand to Sit: 1: +2 Total assist;With upper extremity assist;To bed Stand to Sit: Patient Percentage: 50% Details for Transfer Assistance: tc/assist to direct pt to stand.  assist to come forward over BOS Ambulation/Gait Ambulation/Gait Assistance: 1: +2 Total assist Ambulation/Gait: Patient Percentage: 60% Ambulation Distance (Feet): 28 Feet Assistive device: 2 person hand held assist;Other (Comment) (pushing IV pole) Ambulation/Gait Assistance Details: stable HP gait with minimal need for w/shift assist and more assist with turns. Gait Pattern: Step-to  pattern;Decreased stance time - right;Decreased step length - left;Narrow base of support (HP gait) Wheelchair Mobility Wheelchair Mobility: No Modified Rankin (Stroke Patients Only) Modified Rankin: Severe disability    Exercises     PT Diagnosis:    PT Problem List:   PT Treatment Interventions:     PT Goals Acute Rehab PT Goals Time For Goal Achievement: 12/15/12 Potential to Achieve Goals: Fair Pt will Roll Supine to Right Side: with min assist;with rail Pt will Sit at Edge of Bed: 3-5 min;with modified independence PT Goal: Sit at [place] Of Bed - Progress: Updated due to goal met Pt will go Sit to Supine/Side: with mod assist PT Goal: Sit to Supine/Side - Progress: Progressing toward goal Pt will Transfer Bed to Chair/Chair to Bed: with mod assist PT Transfer Goal: Bed to Chair/Chair to Bed - Progress: Updated due to goal met Additional Goals Additional Goal #1: ambulate 40 feet with mod assist and unilateral device. PT Goal: Additional Goal #1 - Progress: Goal set today  Visit Information  Last PT Received On: 12/02/12 Assistance Needed: +2    Subjective Data  Subjective: My leg won't move... It won't move! Patient Stated Goal: Unable to state   Cognition  Cognition Overall Cognitive Status: Difficult to assess Difficult to assess due to: Impaired communication;Level of arousal Arousal/Alertness: Lethargic Behavior During Session: Lethargic    Balance  Balance Balance Assessed: Yes Static Sitting Balance Static Sitting - Balance Support: Feet supported;No upper extremity supported Static Sitting - Level of Assistance: 5: Stand by assistance Static Sitting - Comment/# of Minutes: 5 min recovering from walk and waiting for a drink.  End of Session PT - End of Session  Activity Tolerance: Patient tolerated treatment well;Other (comment) (believe pt was manipulating the treatment) Patient left: in bed;with call bell/phone within reach;with bed alarm set Nurse  Communication: Mobility status   GP     Makaela Cando, Eliseo Gum 12/02/2012, 3:31 PM 12/02/2012  [place], [place] [phone] (pager)

## 2012-12-02 NOTE — Care Management Note (Addendum)
    Page 1 of 2   12/04/2012     2:19:20 PM   CARE MANAGEMENT NOTE 12/04/2012  Patient:  Elizabeth Love, Elizabeth Love   Account Number:  [id number]  Date Initiated:  11/30/2012  Documentation initiated by:  Lanier Clam  Subjective/Objective Assessment:   ADMITTED W/CHEST PAIN,HEADACHE.STROKE.     Action/Plan:   FROM HOME.PER NSG ADM SUMMARY-[facility].   Anticipated DC Date:  12/04/2012   Anticipated DC Plan:  HOME W HOME HEALTH SERVICES      DC Planning Services  CM consult      [facility] Choice  Resumption Of Svcs/PTA Provider   Choice offered to / List presented to:  C-1 Patient        HH arranged  HH-1 RN  HH-10 DISEASE MANAGEMENT  HH-2 PT  HH-3 OT  HH-6 SOCIAL WORKER  HH-4 NURSE'S AIDE      HH agency  [facility]   Status of service:  Completed, signed off Medicare Important Message given?   (If response is "NO", the following Medicare IM given date fields will be blank) Date Medicare IM given:   Date Additional Medicare IM given:    Discharge Disposition:  HOME W HOME HEALTH SERVICES  Per UR Regulation:  Reviewed for med. necessity/level of care/duration of stay  If discussed at Long Length of Stay Meetings, dates discussed:    Comments:  12-04-12 1417 Tomi Bamberger, RN,BSN [phone] Pt now stating wants [facility] services and not SNF placement. CM did resume HH services with [facility] for services. SOC to begin within 24-48 hours post d/c. No further needs from CM at this time.   12-02-12 Tomi Bamberger, RN,BSN [phone] Plan for d/c tomorrow. Possible snf placement. CSw to assist with disposition.

## 2012-12-02 NOTE — Progress Notes (Signed)
Stroke Team Progress Note  HISTORY Elizabeth Love is an 57 y.o. female with hypertension, diabetes mellitus, CHF-EF 10%, atrial fibrillation, stroke with residual right sided weakness and expressive aphasia, VT status post ICD placement, protein C deficiency, DVT on xarelto, hypothyroidism, asthma, who tonight was noted by the nursing staff to have a facial droop and complete right sided flaccidity. Rapid response was notified and then I was informed about the situation by the rapid response nurse. Exact time of onset is unclear. Of importance, she had INR 10 upon admission and received vitamin K, but INR 7 today.  She underwent emergent CT brain that showed no acute ischemia or hemorrhage.  SUBJECTIVE No complaints today,   OBJECTIVE Most recent Vital Signs: Filed Vitals:   12/01/12 2100 12/01/12 2315 12/02/12 0500 12/02/12 0807  BP: 122/75 113/78 96/62 117/81  Pulse: 75 74 67 71  Temp: 97.3 F (36.3 C)  98.3 F (36.8 C) 98.3 F (36.8 C)  TempSrc:    Oral  Resp: 18  18 18   Height:      Weight:      SpO2: 96%  95% 91%   CBG (last 3)   Recent Labs  12/01/12 2323 12/02/12 0356 12/02/12 0730  GLUCAP 86 92 82   IV Fluid Intake:   . dextrose 40 mL/hr at 12/01/12 2100   MEDICATIONS  . amiodarone  400 mg Oral Daily  . levETIRAcetam  1,000 mg Intravenous Q12H  . metoprolol  2.5 mg Intravenous Q6H  . Rivaroxaban  20 mg Oral Q supper  . sodium chloride  3 mL Intravenous Q12H   PRN:  acetaminophen, acetaminophen, albuterol, ondansetron (ZOFRAN) IV, ondansetron  Diet:  Dysphagia 3 thin liquids Activity:  Up with assistance DVT Prophylaxis:  Xarelto  CLINICALLY SIGNIFICANT STUDIES Basic Metabolic Panel:  Recent Labs Lab 11/27/12 1815 11/28/12 0400  11/30/12 1856 12/01/12 0500 12/01/12 0712  NA 140 141  < > 139 137  --   K 3.4* 3.3*  < > 5.0 6.1* 5.1  CL 103 103  < > 104 102  --   CO2 24 25  < > 23 22  --   GLUCOSE 87 152*  < > 82 77  --   BUN 18 19  < > 28* 29*  --    CREATININE 0.79 0.91  < > 0.98 0.99  --   CALCIUM 9.4 9.5  < > 9.4 9.5  --   MG  --  1.7  --   --   --   --   PHOS  --  3.0  --   --   --   --   < > = values in this interval not displayed. Liver Function Tests:   Recent Labs Lab 11/28/12 0400 11/30/12 1856  AST 24 44*  ALT 10 17  ALKPHOS 70 83  BILITOT 1.7* 1.7*  PROT 8.2 7.5  ALBUMIN 3.1* 2.8*   CBC:  Recent Labs Lab 11/27/12 1815  11/30/12 1856 12/01/12 0500  WBC 6.9  < > 6.9 7.7  NEUTROABS 5.0  --   --   --   HGB 9.5*  < > 8.7* 8.8*  HCT 29.7*  < > 27.3* 27.1*  MCV 82.5  < > 82.7 81.1  PLT 266  < > 238 294  < > = values in this interval not displayed. Coagulation:   Recent Labs Lab 11/28/12 0052 11/29/12 0418 11/30/12 0455 12/01/12 0500  LABPROT 67.9* 59.5* 76.3* 64.4*  INR 9.22*  7.71* 10.79* 8.58*   Cardiac Enzymes:   Recent Labs Lab 11/28/12 0400 11/28/12 1115 11/28/12 1519  TROPONINI <0.30 <0.30 <0.30   Urinalysis:   Recent Labs Lab 11/27/12 2249  COLORURINE YELLOW  LABSPEC 1.017  PHURINE 6.0  GLUCOSEU NEGATIVE  HGBUR NEGATIVE  BILIRUBINUR SMALL*  KETONESUR NEGATIVE  PROTEINUR 30*  UROBILINOGEN 2.0*  NITRITE NEGATIVE  LEUKOCYTESUR TRACE*   Lipid Panel No results found for this basename: chol,  trig,  hdl,  cholhdl,  vldl,  ldlcalc   HgbA1C  Lab Results  Component Value Date   HGBA1C 6.3* 11/06/2012    Urine Drug Screen:     Component Value Date/Time   LABOPIA NONE DETECTED 07/19/2012 2324   COCAINSCRNUR NONE DETECTED 07/19/2012 2324   LABBENZ POSITIVE* 07/19/2012 2324   AMPHETMU NONE DETECTED 07/19/2012 2324   THCU NONE DETECTED 07/19/2012 2324   LABBARB NONE DETECTED 07/19/2012 2324    Alcohol Level: No results found for this basename: ETH,  in the last 168 hours   CT of the brain   11/30/2012   1.  Stable appearance of remote left MCA territory infarct. 2.  No acute intracranial abnormality or significant interval change over last several exams. 11/29/2012 Old left MCA  distribution infarct. Diffuse atrophy and small vessel ischemic changes. No acute intracranial abnormalities. 11/27/2012 1. No acute intracranial abnormality or significant interval change. 2. Remote left MCA territory infarct.  MRI of the brain    MRA of the brain    2D Echocardiogram  Not ordered  Carotid Doppler  12/01/2012 No evidence of hemodynamically significant internal carotid artery stenosis. Vertebral artery flow is antegrade.   CXR 11/28/2012 1. Cardiomegaly with mild interstitial pulmonary edema indicative of mild congestive heart failure. 2. Nodular opacities projecting over the upper lungs appear similar to the recent prior examination, and may simply represent distended pulmonary vessels and edematous changes. Underlying pulmonary nodules are difficult to entirely exclude, however, this would require further evaluation with chest CT if of clinical concern. Alternatively, attention on follow-up radiographs after treatment for underlying congestive heart failure may demonstrate resolution of these findings.  11/27/2012 1. Similar to slightly increased pulmonary vascular congestion. 2.  Stable marked cardiomegaly 3. Suggestion of bilateral upper lung pulmonary  nodules which were not present on the recent prior exam. Query radiolucent  cardiac electrodes.    EKG  Normal sinus rhythm. Low voltage QRS. Nonspecific T wave abnormality. Prolonged QT. Abnormal ECG.  EEG    Therapy Recommendations SNF  Physical Exam   The patient is alert and cooperative, but she is aphasic. Neurologic exam reveals full extraocular movements. Visual fields are difficult to test, she blinks to threat from the left, less well from the right. Fundi were not visualized. Vision acuity and fields are difficult to test. Motor testing reveals good strength of the left extremities. On the right, there is severe weakness, decreased motor tone on the arm and leg. The patient was unable to cooperate for cerebellar  testing. Gait was not tested. Deep tendon reflexes are symmetric, seem generally brisk.  ASSESSMENT [name] is a 57 y.o. female presenting with increased right HP from old stroke. No new stroke on CT. Old deficits worsening typically associated with unstable medical condition, not extension of old stroke. ? Seizure ? UTI. On Xarelto prior to admission. INR remains significantly elevated, 10.79.   Atrial fibrillation, on anticoagulation  Protein C deficiency  Cardiomyopathy  Chronic left MCA CVA, with residual right HP, and aphasia  Hypertension Hx stroke 2007 - resultant right hemiparesis, expressive aphasia and behavioral problems  Hospital day # 5  TREATMENT/PLAN  Continue Xarelto for secondary stroke prevention  Recommend hydration  EEG study, rule out seizure  Will follow  Annie Main, MSN, RN, ANVP-BC, ANP-BC, GNP-BC [facility] Pager: [phone] 12/02/2012 8:27 AM  I have personally obtained a history, examined the patient, evaluated imaging results, and formulated the assessment and plan of care. I agree with the above.  Delia Heady, MD

## 2012-12-02 NOTE — Progress Notes (Signed)
OT Cancellation Note  Patient Details Name: Elizabeth Love MRN: [id number] DOB: 10/20/1955   Cancelled Treatment:    Reason Eval/Treat Not Completed: Patient at procedure or test/ unavailable (At EEG currently)  Lucile Shutters Pager: [phone]  12/02/2012, 12:07 PM

## 2012-12-03 LAB — BASIC METABOLIC PANEL
Chloride: 99 mEq/L (ref 96–112)
GFR calc Af Amer: 81 mL/min — ABNORMAL LOW (ref >90)
GFR calc non Af Amer: 70 mL/min — ABNORMAL LOW (ref >90)
Potassium: 4.9 mEq/L (ref 3.5–5.1)
Sodium: 133 mEq/L — ABNORMAL LOW (ref 135–145)

## 2012-12-03 LAB — GLUCOSE, CAPILLARY: Glucose-Capillary: 121 mg/dL — ABNORMAL HIGH (ref 70–99)

## 2012-12-03 MED ORDER — ACETAMINOPHEN 325 MG PO TABS: 650.0000 mg | ORAL_TABLET | Freq: Four times a day (QID) | ORAL | Status: DC | PRN

## 2012-12-03 MED ORDER — FUROSEMIDE 20 MG PO TABS: 20.0000 mg | ORAL_TABLET | ORAL | Status: DC | PRN

## 2012-12-03 MED ORDER — ALPRAZOLAM 0.5 MG PO TABS: 0.5000 mg | ORAL_TABLET | Freq: Every evening | ORAL | Status: DC | PRN

## 2012-12-03 MED ORDER — OXYCODONE-ACETAMINOPHEN 10-325 MG PO TABS: 1.0000 | ORAL_TABLET | Freq: Four times a day (QID) | ORAL | Status: DC | PRN

## 2012-12-03 NOTE — Progress Notes (Signed)
Stroke Team Progress Note  HISTORY Elizabeth Love is an 57 y.o. female with hypertension, diabetes mellitus, CHF-EF 10%, atrial fibrillation, stroke with residual right sided weakness and expressive aphasia, VT status post ICD placement, protein C deficiency, DVT on xarelto, hypothyroidism, asthma, who tonight was noted by the nursing staff to have a facial droop and complete right sided flaccidity. Rapid response was notified and then I was informed about the situation by the rapid response nurse. Exact time of onset is unclear. Of importance, she had INR 10 upon admission and received vitamin K, but INR 7 today.  She underwent emergent CT brain that showed no acute ischemia or hemorrhage.  SUBJECTIVE No family at the bedside.  OBJECTIVE Most recent Vital Signs: Filed Vitals:   12/02/12 2000 12/03/12 0000 12/03/12 0400 12/03/12 0800  BP: 130/87 128/89 100/71 110/80  Pulse: 93 82 62 65  Temp: 97.2 F (36.2 C) 97.6 F (36.4 C) 97.4 F (36.3 C) 97.9 F (36.6 C)  TempSrc:    Oral  Resp: 18 16 16 17   Height:      Weight:      SpO2: 94% 96% 97% 98%   CBG (last 3)   Recent Labs  12/02/12 1636 12/02/12 2019 12/03/12 0748  GLUCAP 91 108* 100*   IV Fluid Intake:   . dextrose 20 mL/hr at 12/03/12 0514   MEDICATIONS  . amiodarone  400 mg Oral Daily  . aspirin EC  81 mg Oral Daily  . escitalopram  10 mg Oral Daily  . feeding supplement  237 mL Oral BID BM  . levETIRAcetam  1,000 mg Oral BID  . levothyroxine  50 mcg Oral QAC breakfast  . metoprolol tartrate  25 mg Oral BID  . pantoprazole  40 mg Oral Daily  . QUEtiapine  50 mg Oral Daily  . Rivaroxaban  20 mg Oral Q supper  . sodium chloride  3 mL Intravenous Q12H  . spironolactone  25 mg Oral Daily   PRN:  acetaminophen, acetaminophen, albuterol, ALPRAZolam, ondansetron (ZOFRAN) IV, ondansetron  Diet:  Dysphagia 3 thin liquids Activity:  Up with assistance DVT Prophylaxis:  Xarelto  CLINICALLY SIGNIFICANT STUDIES Basic  Metabolic Panel:  Recent Labs Lab 11/27/12 1815 11/28/12 0400  11/30/12 1856 12/01/12 0500 12/01/12 0712  NA 140 141  < > 139 137  --   K 3.4* 3.3*  < > 5.0 6.1* 5.1  CL 103 103  < > 104 102  --   CO2 24 25  < > 23 22  --   GLUCOSE 87 152*  < > 82 77  --   BUN 18 19  < > 28* 29*  --   CREATININE 0.79 0.91  < > 0.98 0.99  --   CALCIUM 9.4 9.5  < > 9.4 9.5  --   MG  --  1.7  --   --   --   --   PHOS  --  3.0  --   --   --   --   < > = values in this interval not displayed. Liver Function Tests:   Recent Labs Lab 11/28/12 0400 11/30/12 1856  AST 24 44*  ALT 10 17  ALKPHOS 70 83  BILITOT 1.7* 1.7*  PROT 8.2 7.5  ALBUMIN 3.1* 2.8*   CBC:  Recent Labs Lab 11/27/12 1815  11/30/12 1856 12/01/12 0500  WBC 6.9  < > 6.9 7.7  NEUTROABS 5.0  --   --   --  HGB 9.5*  < > 8.7* 8.8*  HCT 29.7*  < > 27.3* 27.1*  MCV 82.5  < > 82.7 81.1  PLT 266  < > 238 294  < > = values in this interval not displayed. Coagulation:   Recent Labs Lab 11/28/12 0052 11/29/12 0418 11/30/12 0455 12/01/12 0500  LABPROT 67.9* 59.5* 76.3* 64.4*  INR 9.22* 7.71* 10.79* 8.58*   Cardiac Enzymes:   Recent Labs Lab 11/28/12 0400 11/28/12 1115 11/28/12 1519  TROPONINI <0.30 <0.30 <0.30   Urinalysis:   Recent Labs Lab 11/27/12 2249  COLORURINE YELLOW  LABSPEC 1.017  PHURINE 6.0  GLUCOSEU NEGATIVE  HGBUR NEGATIVE  BILIRUBINUR SMALL*  KETONESUR NEGATIVE  PROTEINUR 30*  UROBILINOGEN 2.0*  NITRITE NEGATIVE  LEUKOCYTESUR TRACE*   Lipid Panel No results found for this basename: chol,  trig,  hdl,  cholhdl,  vldl,  ldlcalc   HgbA1C  Lab Results  Component Value Date   HGBA1C 6.3* 11/06/2012   Urine Drug Screen:     Component Value Date/Time   LABOPIA NONE DETECTED 07/19/2012 2324   COCAINSCRNUR NONE DETECTED 07/19/2012 2324   LABBENZ POSITIVE* 07/19/2012 2324   AMPHETMU NONE DETECTED 07/19/2012 2324   THCU NONE DETECTED 07/19/2012 2324   LABBARB NONE DETECTED 07/19/2012 2324     Alcohol Level: No results found for this basename: ETH,  in the last 168 hours  CT of the brain   11/30/2012   1.  Stable appearance of remote left MCA territory infarct. 2.  No acute intracranial abnormality or significant interval change over last several exams. 11/29/2012 Old left MCA distribution infarct. Diffuse atrophy and small vessel ischemic changes. No acute intracranial abnormalities. 11/27/2012 1. No acute intracranial abnormality or significant interval change. 2. Remote left MCA territory infarct.  2D Echocardiogram  Not ordered  Carotid Doppler  12/01/2012 No evidence of hemodynamically significant internal carotid artery stenosis. Vertebral artery flow is antegrade.   CXR 11/28/2012 1. Cardiomegaly with mild interstitial pulmonary edema indicative of mild congestive heart failure. 2. Nodular opacities projecting over the upper lungs appear similar to the recent prior examination, and may simply represent distended pulmonary vessels and edematous changes. Underlying pulmonary nodules are difficult to entirely exclude, however, this would require further evaluation with chest CT if of clinical concern. Alternatively, attention on follow-up radiographs after treatment for underlying congestive heart failure may demonstrate resolution of these findings.  11/27/2012 1. Similar to slightly increased pulmonary vascular congestion. 2.  Stable marked cardiomegaly 3. Suggestion of bilateral upper lung pulmonary  nodules which were not present on the recent prior exam. Query radiolucent  cardiac electrodes.    EKG  Normal sinus rhythm. Low voltage QRS. Nonspecific T wave abnormality. Prolonged QT. Abnormal ECG.  EEG  Completed.  Therapy Recommendations SNF  Physical Exam   The patient is alert and cooperative, but she is aphasic with expressive and receptive language difficulties.. Neurologic exam reveals full extraocular movements. Visual fields are difficult to test, she blinks to threat  from the left, less well from the right. Fundi were not visualized. Vision acuity and fields are difficult to test. Motor testing reveals good strength of the left extremities. On the right, there is severe weakness, decreased motor tone on the arm and leg. The patient was unable to cooperate for cerebellar testing. Gait was not tested. Deep tendon reflexes are symmetric, seem generally brisk.  ASSESSMENT [name] is a 57 y.o. female presenting with increased right HP from old stroke. No new  stroke on CT. Old deficits worsening typically associated with unstable medical condition, there is no extension of old stroke. ? Seizure On Xarelto prior to admission. INR remains significantly elevated, 8.58.   Atrial fibrillation, on anticoagulation  Protein C deficiency  Cardiomyopathy  Chronic left MCA CVA, with residual right HP, and aphasia Hypertension Hx stroke 2007 - resultant right hemiparesis, expressive aphasia and behavioral problems  Hospital day # 6  TREATMENT/PLAN  Continue Xarelto for secondary stroke prevention  Recommend hydration  F/u EEG, rule out seizure  Annie Main, MSN, RN, ANVP-BC, ANP-BC, GNP-BC [facility] Pager: [phone] 12/03/2012 8:26 AM  I have personally obtained a history, examined the patient, evaluated imaging results, and formulated the assessment and plan of care. I agree with the above.  Delia Heady, MD

## 2012-12-03 NOTE — Progress Notes (Signed)
Clinical Social Work Department BRIEF PSYCHOSOCIAL ASSESSMENT 12/03/2012  Patient:  Elizabeth Love, Elizabeth Love     Account Number:  [id number]     Admit date:  11/27/2012  Clinical Social Worker:  Kirke Shaggy  Date/Time:  12/03/2012 02:34 PM  Referred by:    Date Referred:  12/02/2012 Referred for  SNF Placement   Other Referral:   Interview type:  Patient Other interview type:    PSYCHOSOCIAL DATA Living Status:  FAMILY Admitted from facility:   Level of care:   Primary support name:   Primary support relationship to patient:   Degree of support available:   questionable support    CURRENT CONCERNS Current Concerns  Post-Acute Placement   Other Concerns:   Pt needs continued assistance with PT/OT/CNA and Nursing.    SOCIAL WORK ASSESSMENT / PLAN CSW met with pt twice to discuss SNF Placement. Pt agreed to move to a SNF for continue care. FL2 bed search is being done today.   Assessment/plan status:   Other assessment/ plan:   Information/referral to community resources:    PATIENT'S/FAMILY'S RESPONSE TO PLAN OF CARE: Pt agreed to move to a SNF for continued care. CSW will continue to follow pt.

## 2012-12-03 NOTE — Progress Notes (Signed)
SLP Cancellation Note  Patient Details Name: Elizabeth Love MRN: [id number] DOB: Oct 22, 1955   Cancelled treatment:       Reason Eval/Treat Not Completed: Other (comment) (refused)   Breeanna Galgano, Riley Nearing 12/03/2012, 4:23 PM

## 2012-12-03 NOTE — Procedures (Signed)
EEG NUMBER:  REFERRING PHYSICIAN:  Dr. Anne Hahn.  HISTORY:  A 57 year old female with right-sided weakness and expressive aphasia.  MEDICATIONS:  Keppra, Lopressor, Xarelto, amiodarone.  CONDITIONS OF RECORDING:  This is a 16-channel EEG carried out with the patient in the awake and drowsy state.  DESCRIPTION:  The waking background activity is poorly sustained and consists of a 7-8 Hz theta activity at maximum.  It is often slower during the tracing.  There is a predominance of theta rhythms in the central and temporal regions as well, although some alpha is seen. Faster frequencies are noted anteriorly.  There is noted during wakefulness a persistent slowing over the left frontal temporal region with underlying polymorphic delta rhythms slowing the baseline rhythm. The patient drowses with equalization of slowing noted.  Stage II sleep was not obtained.  Hyperventilation was not performed.  Intermittent photic stimulation failed to elicit any change in the tracing.  IMPRESSION:  This is an abnormal EEG secondary to slowing noted over the left frontotemporal region.  No epileptiform activity was noted.  Also, noted during the tracing was general background slowing.  Although, this may be seen with drowse cannot rule out a more diffuse disturbance such as metabolic encephalopathy etc.          ______________________________ Thana Farr, MD    [id number] D:  12/03/2012 08:43:56  T:  12/03/2012 23:10:43  Job #:  [id number]

## 2012-12-03 NOTE — Progress Notes (Signed)
Verbal bed offer from [facility] was rescended due to the fact that the Admission Coordinator ran out of time in her day to look over this pt, but she will reconsider this pt in the morning. There is another SNF that is also considering this pt. In the morning.  CSW will continue to follow and send out request for a bed for this pt in the morning.  Sherald Barge, LCSW-A Clinical Social Worker [phone]

## 2012-12-03 NOTE — Discharge Summary (Signed)
[facility]  Physician Discharge Summary   Patient ID: Elizabeth Love MRN: [id number] DOB/AGE: [birth year] 57 y.o.  Admit date: 11/27/2012 Discharge date: 12/03/2012  PCP: Estill Cotta, MD  DISCHARGE DIAGNOSES:  Active Problems:   Atrial fibrillation   Cardiomyopathy   Hypokalemia   Protein C deficiency   Acute on chronic systolic CHF (congestive heart failure)   Chest pain at rest   Headache   DM type 2 (diabetes mellitus, type 2)   RECOMMENDATIONS FOR OUTPATIENT FOLLOW UP: 1. Bmet in 1 week 2. Monitor CBG's. No coverage for now. But if CBG stays above 150 consistently, have MD address it. 3. PCP to consider Allopurinol for elevated uric acid.  DISCHARGE CONDITION: fair  Diet recommendation: Mod Carb  Filed Weights   11/30/12 0923  Weight: 52.164 kg (115 lb)    INITIAL HISTORY: Elizabeth Love is a 57 y.o. female who has a past medical history of Seizures; Hypertension; Stroke; Asthma; Diabetes mellitus; Renal disorder; Chronic systolic CHF (congestive heart failure); Atrial fibrillation; Coagulopathy; History of DVT (deep vein thrombosis); Noncompliance; Hypothyroidism; VT (ventricular tachycardia); and Protein C deficiency. She presented with Hx of headache for long time and leg swelling. She is not able to provide any specifics. Note patient has a very complex medical history; she has history of nonischemic cardiomyopathy with EF of 10% status post AICD placement in 2007 in [place]. 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 It is not quite clear what her baseline mental status is, no family was available. She was brought in by ambulance. She also has a history of atrial fibrillation. She was not thought to be a good candidate for anticoagulation due to noncompliance. But in November 2013 she developed DVT with subsequent pulmonary embolism and  was found to have protein C deficiency. She had an IVC filter placement done and was started on Xarelto which is still taking. Of note she has long-standing history of headaches which has been worked up in the past apparently the current CT scan is unremarkable. Chest history of chronic pain and also has trouble verbalizing her symptoms which makes her history taking very difficult. While in ER she had been pointing to her chest occasionally stating that she has some chest pain her troponin was obtained which was slightly elevated at 0.1. Her EKG is unchanged. The past she had had positive troponins which were thought to be secondary to cardiac strain due to CHF. Of note her INR was also checked although she's not a Coumadin it was shown to be greater than 10. This was discussed with pharmacist and they stated that INR is not an accurate test in monitoring of Xarelto and is routinely severely elevated nonetheless they will followup all patient is here. Patient is complaining of leg pain but her legs do not appear to be swollen. In fact she does not showing signs of fluid overload.   Consultations:  Neurology  Procedures: EEG 3/17 No epileptiform activity. Discussed with Dr. Thad Ranger.  HOSPITAL COURSE:   Apparent new Right facial droop and increased Right weakness  During the course of this hospitalization the patient was noted one night to have a new facial droop on the right. She was also more lethargic. As there was a concern that she may have had a new event she underwent 2 CAT scans, both of which showed stable findings without any acute changes. It  was felt then that maybe she had a seizure. She does have a known history of seizure disorder, and is on Keppra. EEG was done, which did not show any new epileptiform activity. Other differentials include medication side effects or behavioral issues due to her significant strokes in the past. Patient is now back to baseline. MRI could not be done due to  AICD. So this is not a new stroke.   Chest pain at rest  EKG's did not show acute ischemic changes. She has ruled out for ACS. Patient does not have any history of coronary artery disease. She has nonischemic cardiomyopathy with an EF of 10%. She appears to be well compensated currently. No further work up at this time. Reason for pain not clear as she is a poor historian. She has been seen by cardiology in the recent past and they do not plan to do any further workup.  Leg pain/Possible Gout  X rays did not reveal anything acute. Uric acid was significantly high. Given one dose of colchicine due to concern of significant interaction with amiodarone.  Can use Tylenol as needed for pain control. PCP to please consider her for Allopurinol.  History of DVT/PE and Protein C deficiency  She has a history of protein C deficiency and has had DVT and PE in the past. When she presented to the hospital her INR was elevated at greater than 10. She is on Xarelto. Unclear why INR was checked to begin with. I discussed this issue with the hematologist. We don't have a clear explanation. She was given 4 doses of vitamin K. INR did come down some. Subsequently, we stopped checking it because patient was not having any bleeding. Continue Xarelto.   History of stroke This is stable. She does have some degree of aphasia. She also has behavioral issues because of the effects of the stroke.  She has not been on a statin. I reviewed her records in the EMR. Unclear why she is not on this class of medications. She has never has been on it as far as we know. Patient unable to provide this information nor her family. I would prefer her PCP initiate this if there are no documented side effects. Since she did not have a new stroke during this hospitalization there is no clear indication for same.  Hypokalemia  Potassium levels fluctuated during this hospitalization, but now is within normal limits.  History of Atrial  fibrillation  Stable. Continue home medications.   Non-ischemic Cardiomyopathy  Currently appears to be euvolemic. Continue Spiriva, lactone. Continue Lasix as before. Continue lisinopril.  Headache  This is chronic. CT scan unremarkable. Treat symptomatically.   Nutrition  Tolerating diet. Nutritional supplements.  History of DM2  HBA1C was 6.3 in Feb. She was on oral agents at home. However, her blood sugars are running borderline low. We will manage this just with diet for now and stop her oral agent. CBGs to be checked at the skilled nursing facility. If the levels consistently stay above 150 sliding scale coverage may be considered.  PERTINENT LABS: The results of significant diagnostics from this hospitalization (including imaging, microbiology, ancillary and laboratory) are listed below for reference.    Labs: Basic Metabolic Panel:  Recent Labs Lab 11/27/12 1815 11/28/12 0400 11/29/12 0448 11/30/12 1856 12/01/12 0500 12/01/12 0712 12/03/12 1055  NA 140 141 142 139 137  --  133*  K 3.4* 3.3* 4.2 5.0 6.1* 5.1 4.9  CL 103 103 104 104 102  --  99  CO2 24 25 25 23 22   --  23  GLUCOSE 87 152* 126* 82 77  --  116*  BUN 18 19 21  28* 29*  --  22  CREATININE 0.79 0.91 0.91 0.98 0.99  --  0.91  CALCIUM 9.4 9.5 9.6 9.4 9.5  --  9.5  MG  --  1.7  --   --   --   --   --   PHOS  --  3.0  --   --   --   --   --    Liver Function Tests:  Recent Labs Lab 11/27/12 1815 11/28/12 0400 11/30/12 1856  AST 22 24 44*  ALT 10 10 17   ALKPHOS 67 70 83  BILITOT 1.7* 1.7* 1.7*  PROT 7.6 8.2 7.5  ALBUMIN 3.0* 3.1* 2.8*   CBC:  Recent Labs Lab 11/27/12 1815 11/28/12 0400 11/30/12 1856 12/01/12 0500  WBC 6.9 7.3 6.9 7.7  NEUTROABS 5.0  --   --   --   HGB 9.5* 9.7* 8.7* 8.8*  HCT 29.7* 31.2* 27.3* 27.1*  MCV 82.5 84.1 82.7 81.1  PLT 266 297 238 294   Cardiac Enzymes:  Recent Labs Lab 11/28/12 0400 11/28/12 1115 11/28/12 1519  TROPONINI <0.30 <0.30 <0.30    BNP: BNP (last 3 results)  Recent Labs  11/05/12 1607 11/06/12 0825 11/27/12 1821  PROBNP 1906.0* 12374.0* 9596.0*   CBG:  Recent Labs Lab 12/02/12 1214 12/02/12 1636 12/02/12 2019 12/03/12 0748 12/03/12 1058  GLUCAP 82 91 108* 100* 121*     IMAGING STUDIES Dg Chest 1 View  11/27/2012  *RADIOLOGY REPORT*  Clinical Data: Chest pain  CHEST - 1 VIEW  Comparison: Prior chest x-ray 11/06/2012  Findings: Marked enlargement the cardiopericardial silhouettes. Chronic blunting of the right costophrenic angle appears similar to prior.  There is pulmonary vascular congestion which appears similar to slightly increased compared to prior.  A suggestion of bilateral upper lung nodules which not present on the relatively recent prior exam.  Query radiolucent cardiac monitor electrodes. Similar appearance of right middle lobe opacity compared to prior favored to reflect atelectasis versus scarring.  Stable left subclavian approach cardiac rhythm maintenance device.  IMPRESSION:  1.  Similar to slightly increased pulmonary vascular congestion. 2.  Stable marked cardiomegaly 3.  Suggestion of bilateral upper lung pulmonary nodules which were not present on the recent prior exam.  Query radiolucent cardiac electrodes.   Original Report Authenticated By: Malachy Moan, M.D.    Dg Chest 2 View  11/28/2012  *RADIOLOGY REPORT*  Clinical Data: Evaluate nodules seen on prior chest radiograph.  CHEST - 2 VIEW  Comparison: Chest x-ray 11/27/2012.  Findings: Again noted is severe cardiomegaly.  Marked pulmonary venous congestion with some Kerley B lines in the periphery of the lungs bilaterally, suggesting mild interstitial pulmonary edema. There is also thickening of the minor fissure.  Subsegmental atelectasis in the lower lobes of the lungs bilaterally.  No definite consolidative airspace disease.  No significant pleural effusions.  Nodular opacities noted on the prior examination in the upper lungs appear  similar on today's study, however, it is uncertain whether these represent true pulmonary nodules, or are simply related to distended pulmonary vessels.  Left sided pacemaker / AICD with lead tips projecting over the expected location of the right atrium and right ventricular apex.  IMPRESSION: 1.  Cardiomegaly with mild interstitial pulmonary edema indicative of mild congestive heart failure. 2.  Nodular opacities projecting over the upper  lungs appear similar to the recent prior examination, and may simply represent distended pulmonary vessels and edematous changes.  Underlying pulmonary nodules are difficult to entirely exclude, however, this would require further evaluation with chest CT if of clinical concern.  Alternatively, attention on follow-up radiographs after treatment for underlying congestive heart failure may demonstrate resolution of these findings.   Original Report Authenticated By: Trudie Reed, M.D.    Dg Chest 2 View  11/06/2012  *RADIOLOGY REPORT*  Clinical Data: Chest pain, history of diabetes  CHEST - 2 VIEW  Comparison: Chest x-ray of 09/14/2012  Findings: Moderate cardiomegaly is stable.  There does appear to be pulmonary vascular congestion present with small effusions, suggesting mild congestive heart failure.  An AICD lead is noted. The bones are osteopenic.  IMPRESSION: Probable mild CHF with small effusions and pulmonary vascular congestion.   Original Report Authenticated By: Dwyane Dee, M.D.    Dg Ankle 2 Views Right  11/28/2012  *RADIOLOGY REPORT*  Clinical Data: Right ankle pain.  RIGHT ANKLE - 2 VIEW  Comparison: No priors.  Findings: Two views of the right ankle demonstrate osteopenia.  No acute displaced fracture, subluxation, dislocation, joint or soft tissue abnormality.  Mild degenerative changes of osteoarthritis are noted in the midfoot.  IMPRESSION: 1.  No acute radiographic abnormality of the right ankle. 2.  Osteopenia.   Original Report Authenticated By: Trudie Reed, M.D.    Ct Head Wo Contrast  11/30/2012  *RADIOLOGY REPORT*  Clinical Data: Stroke.  CT HEAD WITHOUT CONTRAST  Technique:  Contiguous axial images were obtained from the base of the skull through the vertex without contrast.  Comparison: CT head without contrast 11/29/2012  Findings: A remote left MCA territory infarct is not significantly changed.  Ex vacuo dilation of the left lateral ventricle is again noted.  There is no hemorrhage or mass lesion.  The right ventricle is of normal size.  No significant extra-axial fluid collection is present.  The paranasal sinuses and mastoid air cells are clear.  The osseous skull is intact.  IMPRESSION:  1.  Stable appearance of remote left MCA territory infarct. 2.  No acute intracranial abnormality or significant interval change over last several exams.   Original Report Authenticated By: Marin Roberts, M.D.    Ct Head Wo Contrast  11/29/2012  *RADIOLOGY REPORT*  Clinical Data: New onset right-sided flaccidness.  History of strokes.  CT HEAD WITHOUT CONTRAST  Technique:  Contiguous axial images were obtained from the base of the skull through the vertex without contrast.  Comparison: 11/27/2012  Findings: There is a large area of encephalomalacia in the left cerebral hemisphere involving posterior frontal, temporal, and parietal lobes.  This is stable since the previous study and is consistent with old infarct.  There is diffuse cerebral atrophy. Asymmetric dilatation of the left lateral ventricle is consistent with negative mass effect relating to the old infarct.  No acute mass effect or midline shift.  No abnormal extra-axial fluid collections.  Remaining gray-white matter junctions are distinct. Basal cisterns are not effaced.  Scattered low attenuation changes in the deep white matter most consistent with small vessel ischemia.  The no evidence of acute intracranial hemorrhage.  No depressed skull fractures.  Visualized paranasal sinuses and  mastoid air cells are not opacified.  Vascular calcifications. Generally stable appearance of the intracranial contents since previous study.  IMPRESSION: Old left MCA distribution infarct.  Diffuse atrophy and small vessel ischemic changes.  No acute intracranial abnormalities.   Original  Report Authenticated By: Burman Nieves, M.D.    Ct Head Wo Contrast  11/27/2012  *RADIOLOGY REPORT*  Clinical Data: Headache.  Elevated INR.  CT HEAD WITHOUT CONTRAST  Technique:  Contiguous axial images were obtained from the base of the skull through the vertex without contrast.  Comparison: CT head without contrast 02/21/ 2014  Findings: The remote left MCA territory infarct is stable. Expected dilation of the lateral ventricles is evident.  Remote lacunar infarcts of the cerebellum are stable.  Mild white matter disease is unchanged.  No acute to infarct, hemorrhage, mass lesion is present.  The paranasal sinuses and mastoid air cells are clear.  IMPRESSION:  1.  No acute intracranial abnormality or significant interval change. 2.  Remote left MCA territory infarct.   Original Report Authenticated By: Marin Roberts, M.D.    Dg Foot 2 Views Right  11/28/2012  *RADIOLOGY REPORT*  Clinical Data: Pain in the foot and ankle.  RIGHT FOOT - 2 VIEW  Comparison: No priors.  Findings: Bones are osteopenic.  No acute displaced fracture, subluxation, dislocation, joint or soft tissue abnormality.  Mild degenerative changes of osteoarthritis in the midfoot and at the first MTP joint.  IMPRESSION: 1.  No acute radiographic abnormality in the right foot. 2.  Mild degenerative changes of osteoarthritis as above. 3.  Osteopenia.   Original Report Authenticated By: Trudie Reed, M.D.     DISCHARGE EXAMINATION: Filed Vitals:   12/02/12 2000 12/03/12 0000 12/03/12 0400 12/03/12 0800  BP: 130/87 128/89 100/71 110/80  Pulse: 93 82 62 65  Temp: 97.2 F (36.2 C) 97.6 F (36.4 C) 97.4 F (36.3 C) 97.9 F (36.6 C)   TempSrc:    Oral  Resp: 18 16 16 17   Height:      Weight:      SpO2: 94% 96% 97% 98%   General appearance: alert and distracted Resp: clear to auscultation bilaterally Cardio: regular rate and rhythm, S1, S2 normal, no murmur, click, rub or gallop GI: soft, non-tender; bowel sounds normal; no masses,  no organomegaly Neurologic: Old right hemiparesis  DISPOSITION: SNF  Discharge Orders   Future Appointments Provider Department Dept Phone   12/11/2012 3:30 PM Duke Salvia, MD [facility] Main Office [place]) [phone]   02/06/2013 1:45 PM Rollene Rotunda, MD [place] [facility] Main Office [place]) [phone]   Future Orders Complete By Expires     Diet Carb Modified  As directed     Discharge instructions  As directed     Comments:      Please check CBG 3 times a day. If CBG is greater than 150 consistently, please notify MD. Please check BMET in 1 week.    Increase activity slowly  As directed       Current Discharge Medication List    START taking these medications   Details  acetaminophen (TYLENOL) 325 MG tablet Take 2 tablets (650 mg total) by mouth every 6 (six) hours as needed.      CONTINUE these medications which have CHANGED   Details  ALPRAZolam (XANAX) 0.5 MG tablet Take 1 tablet (0.5 mg total) by mouth at bedtime as needed for sleep. For sleep Qty: 30 tablet, Refills: 0    furosemide (LASIX) 20 MG tablet Take 1 tablet (20 mg total) by mouth as needed (for weight gain of 2 lbs from baseline). Qty: 30 tablet, Refills: 3    oxyCODONE-acetaminophen (PERCOCET) 10-325 MG per tablet Take 1 tablet by mouth every 6 (six) hours as needed  for pain. Scheduled per pharmacy Qty: 30 tablet, Refills: 0      CONTINUE these medications which have NOT CHANGED   Details  albuterol (PROVENTIL) (2.5 MG/3ML) 0.083% nebulizer solution Take 2.5 mg by nebulization every 6 (six) hours as needed. For cough or wheeze    amiodarone (PACERONE) 400 MG tablet Take 1 tablet (400  mg total) by mouth daily. Qty: 30 tablet, Refills: 0    aspirin 81 MG EC tablet Take 1 tablet (81 mg total) by mouth daily. Swallow whole. Qty: 30 tablet, Refills: 12    escitalopram (LEXAPRO) 10 MG tablet Take 10 mg by mouth daily.    feeding supplement (ENSURE COMPLETE) LIQD Take 237 mLs by mouth 2 (two) times daily between meals.    levETIRAcetam (KEPPRA) 500 MG tablet Take 1,000 mg by mouth 2 (two) times daily.    levothyroxine (SYNTHROID, LEVOTHROID) 50 MCG tablet Take 50 mcg by mouth daily.    lisinopril (PRINIVIL,ZESTRIL) 5 MG tablet Take 5 mg by mouth daily.    metoprolol tartrate (LOPRESSOR) 25 MG tablet Take 25 mg by mouth 2 (two) times daily.    omeprazole (PRILOSEC) 20 MG capsule Take 20 mg by mouth 2 (two) times daily.    QUEtiapine (SEROQUEL XR) 50 MG TB24 Take 50 mg by mouth daily.    Rivaroxaban (XARELTO) 20 MG TABS Take 1 tablet (20 mg total) by mouth daily with supper. Qty: 30 tablet, Refills: 0    spironolactone (ALDACTONE) 25 MG tablet Take 1 tablet (25 mg total) by mouth daily. Qty: 30 tablet, Refills: 0      STOP taking these medications     glipiZIDE (GLUCOTROL XL) 5 MG 24 hr tablet        Follow-up Information   Follow up with [name], [name], MD. Schedule an appointment as soon as possible for a visit in 3 weeks.   Contact information:   [address] [place] [address] [phone]       TOTAL DISCHARGE TIME: 35 mins  [facility]  [facility] Pager [phone]  12/03/2012, 12:21 PM

## 2012-12-03 NOTE — Evaluation (Signed)
Occupational Therapy Evaluation Patient Details Name: Elizabeth Love MRN: [id number] DOB: February 17, 1956 Today's Date: 12/03/2012 Time: 4098-1191 OT Time Calculation (min): 34 min  OT Assessment / Plan / Recommendation Clinical Impression  57 yo female admitted to [facility] for protien C deficiency, cardiomyopathy, elevated INR, chronic L MCA CVA with questionable new stroke (increased R facial droop and decreased right arm and leg strength) OT to defer all further treatment to SNF at this time.    OT Assessment  All further OT needs can be met in the next venue of care    Follow Up Recommendations  SNF    Barriers to Discharge      Equipment Recommendations  None recommended by OT    Recommendations for Other Services    Frequency       Precautions / Restrictions Precautions Precautions: Fall Precaution Comments: R hemiperesis Restrictions Weight Bearing Restrictions: No   Pertinent Vitals/Pain Liable , screaming, reports HA on left side then later reports pain at Rt wrist. Pt very inconsistent in location of pain.     ADL       OT Diagnosis:    OT Problem List:   OT Treatment Interventions:     OT Goals    Visit Information  Last OT Received On: 12/03/12 Assistance Needed: +2 (for safety) PT/OT Co-Evaluation/Treatment: Yes    Subjective Data  Subjective: "I CAN"T TAKE IT ANYMORE!! NONONO NO" Patient Stated Goal: does not clearly verbalize   Prior Functioning     Home Living Available Help at Discharge: Skilled Nursing Facility Communication Communication: Expressive difficulties Dominant Hand:  (using LT due to hemiplegia)         Vision/Perception     Cognition  Cognition Overall Cognitive Status: Impaired Area of Impairment: Attention;Safety/judgement;Awareness of errors Difficult to assess due to: Impaired communication Arousal/Alertness: Awake/alert Orientation Level:  (liable difficult to assess) Behavior During Session: Agitated Current  Attention Level: Sustained Attention - Other Comments: pt internally distracted by pain, hard to focus on task at hand due to pain and screaming Following Commands: Follows one step commands with increased time;Follows one step commands inconsistently Safety/Judgement: Decreased safety judgement for tasks assessed;Decreased awareness of need for assistance Awareness of Errors: Assistance required to identify errors made;Assistance required to correct errors made Awareness of Errors - Other Comments: mildly scissoring gait pattern, needed cues to correct and at times manual assist from therapist.   Cognition - Other Comments: Pt is very child-like in her emotional presentation.  She is watching the cartoon network on TV and is screaming out as if throwing a temper-tantrum flaling her arms and legs (no actual tears).      Extremity/Trunk Assessment Right Upper Extremity Assessment RUE ROM/Strength/Tone: Deficits;Due to impaired cognition RUE ROM/Strength/Tone Deficits: Pt with anterior subluxation with scapula abduction, scapula elevation  with 3 finger subluxation. PT provided scapular mobilization to facilitation humeral head with glenohumeral joint for weight bearing. Pt with hyptonicity of scapular depressors (Brunstrom I) RUE Sensation: Deficits RUE Coordination: Deficits RUE Coordination Deficits: Brunstrom I- no edema noted Left Upper Extremity Assessment LUE ROM/Strength/Tone: WFL for tasks assessed;Due to impaired cognition Right Lower Extremity Assessment RLE Coordination:  (pt demonstrates knee hyperextension with mobility)     Mobility Bed Mobility Bed Mobility: Supine to Sit;Sitting - Scoot to Edge of Bed;Sit to Supine Supine to Sit: 4: Min guard;HOB elevated;With rails Sitting - Scoot to Edge of Bed: 4: Min guard;With rail Sit to Supine: 3: Mod assist;With rail;HOB flat Details for Bed Mobility Assistance: min  guard to get to sitting EOB with HOB raised to ~35 degrees.  Pt using  railing with left arm for support to get to sitting.  Mod assist to help lift both legs back into bed to get to supine. Pt using bed rail again for support.   Transfers Sit to Stand: 4: Min assist;With upper extremity assist;From bed;From chair/3-in-1;With armrests Stand to Sit: 4: Min assist;With upper extremity assist;With armrests;To bed;To chair/3-in-1 Details for Transfer Assistance: min assist to ensure pt is balance, heavy reliance on left arm support for balance while standing and moving to 3-in-1.       Exercise     Balance     End of Session OT - End of Session Activity Tolerance: Patient tolerated treatment well Patient left: in bed;with call bell/phone within reach;with bed alarm set Nurse Communication: Mobility status;Precautions  GO     Lucile Shutters 12/03/2012, 1:29 PM Pager: [phone]

## 2012-12-03 NOTE — Progress Notes (Signed)
Physical Therapy Treatment Patient Details Name: Elizabeth Love MRN: [id number] DOB: 11/13/1955 Today's Date: 12/03/2012 Time: 1478-2956 PT Time Calculation (min): 49 min  PT Assessment / Plan / Recommendation Comments on Treatment Session  57 y.o. 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  We continue to have a difficult time getting pt to participate in her therapy session today. She is reporting HA and screaming out as if crying with no tears.  She was eventually agreeable to participate some, but not to her full potential today.  Her emotional state will continue to affect her ability to progress with PT/OT.      Follow Up Recommendations  SNF     Does the patient have the potential to tolerate intense rehabilitation   NA  Barriers to Discharge   none      Equipment Recommendations  None recommended by PT    Recommendations for Other Services   none  Frequency Min 3X/week   Plan Discharge plan remains appropriate;Frequency needs to be updated    Precautions / Restrictions Precautions Precautions: Fall Precaution Comments: R hemiperesis Restrictions Weight Bearing Restrictions: No   Pertinent Vitals/Pain Reports 10/10 HA pain, RN aware and report gave pt pain meds less than an hour ago.      Mobility  Bed Mobility Bed Mobility: Supine to Sit;Sitting - Scoot to Edge of Bed;Sit to Supine Supine to Sit: 4: Min guard;HOB elevated;With rails Sitting - Scoot to Edge of Bed: 4: Min guard;With rail Sit to Supine: 3: Mod assist;With rail;HOB flat Details for Bed Mobility Assistance: min guard to get to sitting EOB with HOB raised to ~35 degrees.  Pt using railing with left arm for support to get to sitting.  Mod assist to help lift both legs back into bed to get to supine. Pt using bed rail again for support.   Transfers Sit to  Stand: 4: Min assist;With upper extremity assist;From bed;From chair/3-in-1;With armrests Stand to Sit: 4: Min assist;With upper extremity assist;With armrests;To bed;To chair/3-in-1 Stand Pivot Transfers: 4: Min assist;From elevated surface;With armrests Details for Transfer Assistance: min assist to ensure pt is balance, heavy reliance on left arm support for balance while standing and moving to 3-in-1.   Ambulation/Gait Ambulation/Gait Assistance: 1: +2 Total assist Ambulation/Gait: Patient Percentage: 80% Ambulation Distance (Feet): 10 Feet Assistive device: 1 person hand held assist;Other (Comment) (second person stabilizing at trunk for balance, weight shift) Ambulation/Gait Assistance Details: 1 person hand held assist on the left and one person assist at trunk for weight shift, balance, and at times to separate and forwardly progress right leg.  Gait Pattern: Decreased dorsiflexion - right;Decreased weight shift to left;Right steppage;Right genu recurvatum;Scissoring;Narrow base of support Gait velocity: less than 1.8 ft/sec putting her at risk for recurrent falls     PT Goals Acute Rehab PT Goals PT Goal: Supine/Side to Sit - Progress: Progressing toward goal Pt will go Sit to Supine/Side: with min assist;with HOB 0 degrees PT Goal: Sit to Supine/Side - Progress: Updated due to goal met Pt will Transfer Bed to Chair/Chair to Bed: with supervision PT Transfer Goal: Bed to Chair/Chair to Bed - Progress: Updated due to goal met Additional Goals PT Goal: Additional Goal #1 - Progress: Progressing toward goal  Visit Information  Last PT Received On: 12/03/12 Assistance Needed: +2 (for safety) PT/OT Co-Evaluation/Treatment: Yes    Subjective Data  Subjective: Pt screaming stating "my head" "I don't feel good"  Again, it too much convincing for pt to get up and work with PT/OT.    Cognition  Cognition Overall Cognitive Status: Impaired Area of Impairment:  Attention;Safety/judgement;Awareness of errors Arousal/Alertness: Awake/alert Behavior During Session: Agitated Current Attention Level: Sustained Attention - Other Comments: pt internally distracted by pain, hard to focus on task at hand due to pain and screaming Safety/Judgement: Decreased safety judgement for tasks assessed;Decreased awareness of need for assistance Awareness of Errors: Assistance required to identify errors made;Assistance required to correct errors made Awareness of Errors - Other Comments: mildly scissoring gait pattern, needed cues to correct and at times manual assist from therapist.   Cognition - Other Comments: Pt is very child-like in her emotional presentation.  She is watching the cartoon network on TV and is screaming out as if throwing a temper-tantrum flaling her arms and legs (no actual tears).         End of Session PT - End of Session Activity Tolerance: Patient limited by pain;Other (comment) (limited by pt's cognition and participation) Patient left: in bed;with call bell/phone within reach;with nursing in room     [place] B. Cleveland Yarbro, PT, DPT [phone]   12/03/2012, 12:42 PM

## 2012-12-04 LAB — GLUCOSE, CAPILLARY: Glucose-Capillary: 112 mg/dL — ABNORMAL HIGH (ref 70–99)
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
## 2012-12-04 NOTE — Progress Notes (Signed)
Pt son, Feliz Beam, provided with dc instructions and education. Pt son provided with dc prescriptions (percocet and xanex). Feliz Beam verbalized understanding of all medications and able to teachbackwhich medications are still needed today. Stroke education provided to son and patient. All questions answered. IV removed with tip intact. Heart monitor cleaned and returned to front. Levonne Spiller, RN
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
## 2012-12-04 NOTE — Progress Notes (Signed)
Stroke Team Progress Note  HISTORY Elizabeth Love is an 57 y.o. female with hypertension, diabetes mellitus, CHF-EF 10%, atrial fibrillation, stroke with residual right sided weakness and expressive aphasia, VT status post ICD placement, protein C deficiency, DVT on xarelto, hypothyroidism, asthma, who tonight was noted by the nursing staff to have a facial droop and complete right sided flaccidity. Rapid response was notified and then I was informed about the situation by the rapid response nurse. Exact time of onset is unclear. Of importance, she had INR 10 upon admission and received vitamin K, but INR 7 today.  She underwent emergent CT brain that showed no acute ischemia or hemorrhage.  SUBJECTIVE Patient's attending at the bedside.  OBJECTIVE Most recent Vital Signs: Filed Vitals:   12/03/12 1200 12/03/12 1600 12/03/12 2000 12/04/12 0500  BP: 115/82 118/80 114/76 111/70  Pulse: 62 65 69 65  Temp: 98.4 F (36.9 C) 97.9 F (36.6 C) 98.2 F (36.8 C) 97.6 F (36.4 C)  TempSrc: Oral Oral    Resp: 18 16 18 20   Height:      Weight:      SpO2: 97% 98% 96% 94%   CBG (last 3)   Recent Labs  12/03/12 1644 12/03/12 2001 12/04/12 0716  GLUCAP 118* 97 112*   IV Fluid Intake:   . dextrose 20 mL/hr at 12/03/12 0514   MEDICATIONS  . amiodarone  400 mg Oral Daily  . aspirin EC  81 mg Oral Daily  . escitalopram  10 mg Oral Daily  . feeding supplement  237 mL Oral BID BM  . levETIRAcetam  1,000 mg Oral BID  . levothyroxine  50 mcg Oral QAC breakfast  . metoprolol tartrate  25 mg Oral BID  . pantoprazole  40 mg Oral Daily  . QUEtiapine  50 mg Oral Daily  . Rivaroxaban  20 mg Oral Q supper  . sodium chloride  3 mL Intravenous Q12H  . spironolactone  25 mg Oral Daily   PRN:  acetaminophen, acetaminophen, albuterol, ALPRAZolam, ondansetron (ZOFRAN) IV, ondansetron  Diet:  Dysphagia 3 thin liquids Activity:  Up with assistance DVT Prophylaxis:  Xarelto  CLINICALLY SIGNIFICANT  STUDIES Basic Metabolic Panel:  Recent Labs Lab 11/27/12 1815 11/28/12 0400  12/01/12 0500 12/01/12 0712 12/03/12 1055  NA 140 141  < > 137  --  133*  K 3.4* 3.3*  < > 6.1* 5.1 4.9  CL 103 103  < > 102  --  99  CO2 24 25  < > 22  --  23  GLUCOSE 87 152*  < > 77  --  116*  BUN 18 19  < > 29*  --  22  CREATININE 0.79 0.91  < > 0.99  --  0.91  CALCIUM 9.4 9.5  < > 9.5  --  9.5  MG  --  1.7  --   --   --   --   PHOS  --  3.0  --   --   --   --   < > = values in this interval not displayed. Liver Function Tests:   Recent Labs Lab 11/28/12 0400 11/30/12 1856  AST 24 44*  ALT 10 17  ALKPHOS 70 83  BILITOT 1.7* 1.7*  PROT 8.2 7.5  ALBUMIN 3.1* 2.8*   CBC:  Recent Labs Lab 11/27/12 1815  11/30/12 1856 12/01/12 0500  WBC 6.9  < > 6.9 7.7  NEUTROABS 5.0  --   --   --  HGB 9.5*  < > 8.7* 8.8*  HCT 29.7*  < > 27.3* 27.1*  MCV 82.5  < > 82.7 81.1  PLT 266  < > 238 294  < > = values in this interval not displayed. Coagulation:   Recent Labs Lab 11/28/12 0052 11/29/12 0418 11/30/12 0455 12/01/12 0500  LABPROT 67.9* 59.5* 76.3* 64.4*  INR 9.22* 7.71* 10.79* 8.58*   Cardiac Enzymes:   Recent Labs Lab 11/28/12 0400 11/28/12 1115 11/28/12 1519  TROPONINI <0.30 <0.30 <0.30   Urinalysis:   Recent Labs Lab 11/27/12 2249  COLORURINE YELLOW  LABSPEC 1.017  PHURINE 6.0  GLUCOSEU NEGATIVE  HGBUR NEGATIVE  BILIRUBINUR SMALL*  KETONESUR NEGATIVE  PROTEINUR 30*  UROBILINOGEN 2.0*  NITRITE NEGATIVE  LEUKOCYTESUR TRACE*   Lipid Panel No results found for this basename: chol,  trig,  hdl,  cholhdl,  vldl,  ldlcalc   HgbA1C  Lab Results  Component Value Date   HGBA1C 6.3* 11/06/2012   Urine Drug Screen:     Component Value Date/Time   LABOPIA NONE DETECTED 07/19/2012 2324   COCAINSCRNUR NONE DETECTED 07/19/2012 2324   LABBENZ POSITIVE* 07/19/2012 2324   AMPHETMU NONE DETECTED 07/19/2012 2324   THCU NONE DETECTED 07/19/2012 2324   LABBARB NONE DETECTED  07/19/2012 2324    Alcohol Level: No results found for this basename: ETH,  in the last 168 hours  CT of the brain   11/30/2012   1.  Stable appearance of remote left MCA territory infarct. 2.  No acute intracranial abnormality or significant interval change over last several exams. 11/29/2012 Old left MCA distribution infarct. Diffuse atrophy and small vessel ischemic changes. No acute intracranial abnormalities. 11/27/2012 1. No acute intracranial abnormality or significant interval change. 2. Remote left MCA territory infarct.  2D Echocardiogram  Not ordered  Carotid Doppler  12/01/2012 No evidence of hemodynamically significant internal carotid artery stenosis. Vertebral artery flow is antegrade.   CXR 11/28/2012 1. Cardiomegaly with mild interstitial pulmonary edema indicative of mild congestive heart failure. 2. Nodular opacities projecting over the upper lungs appear similar to the recent prior examination, and may simply represent distended pulmonary vessels and edematous changes. Underlying pulmonary nodules are difficult to entirely exclude, however, this would require further evaluation with chest CT if of clinical concern. Alternatively, attention on follow-up radiographs after treatment for underlying congestive heart failure may demonstrate resolution of these findings.  11/27/2012 1. Similar to slightly increased pulmonary vascular congestion. 2.  Stable marked cardiomegaly 3. Suggestion of bilateral upper lung pulmonary  nodules which were not present on the recent prior exam. Query radiolucent  cardiac electrodes.    EKG  Normal sinus rhythm. Low voltage QRS. Nonspecific T wave abnormality. Prolonged QT. Abnormal ECG.  EEG  This is an abnormal EEG secondary to slowing noted over the left frontotemporal region. No epileptiform activity was noted. Also, noted during the tracing was general background slowing. Although, this may be seen with drowse cannot rule out a more diffuse disturbance  such as metabolic encephalopathy etc.  Therapy Recommendations SNF  Physical Exam   The patient is alert and cooperative, but she is aphasic with expressive and receptive language difficulties.. Neurologic exam reveals full extraocular movements. Visual fields are difficult to test, she blinks to threat from the left, less well from the right. Fundi were not visualized. Vision acuity and fields are difficult to test. Motor testing reveals good strength of the left extremities. On the right, there is severe weakness, decreased motor tone on  the arm and leg. The patient was unable to cooperate for cerebellar testing. Gait was not tested. Deep tendon reflexes are symmetric, seem generally brisk.  ASSESSMENT Elizabeth Love is a 57 y.o. female presenting with increased right HP from old stroke. No new stroke on CT. Old deficits worsening typically associated with unstable medical condition, there is no extension of old stroke. ? Seizure, not present on EEG. On Xarelto prior to admission. INR remains significantly elevated, 8.58.   Atrial fibrillation, on anticoagulation  Protein C deficiency  Cardiomyopathy  Chronic left MCA CVA, with residual right HP, and aphasia Hypertension Hx stroke 2007 - resultant right hemiparesis, expressive aphasia and behavioral problems  Hospital day # 7  TREATMENT/PLAN  Continue Xarelto for secondary stroke prevention No further stroke workup indicated. Ongoing risk factor control by Primary Care Physician Stroke Service will sign off. Please call should any needs arise. No neuro follow up recommended.  Annie Main, MSN, RN, ANVP-BC, ANP-BC, [facility] Pager: [phone] 12/04/2012 8:33 AM  I have personally obtained a history, examined the patient, evaluated imaging results, and formulated the assessment and plan of care. I agree with the above.  Delia Heady, MD

## 2012-12-04 NOTE — Discharge Summary (Signed)
[facility]  Physician Discharge Summary   Patient ID: Elizabeth Love MRN: [id number] DOB/AGE: 1955-12-08 57 y.o.  Admit date: 11/27/2012 Discharge date: 12/04/2012  PCP: Estill Cotta, MD  DISCHARGE DIAGNOSES:  Active Problems:   Atrial fibrillation   Cardiomyopathy   Hypokalemia   Protein C deficiency   Acute on chronic systolic CHF (congestive heart failure)   Chest pain at rest   Headache   DM type 2 (diabetes mellitus, type 2)   RECOMMENDATIONS FOR OUTPATIENT FOLLOW UP: 1. Bmet in 1 week 2. Monitor CBG's. No coverage for now. But if CBG stays above 150 consistently, have MD address it. 3. PCP to consider Allopurinol for elevated uric acid.  DISCHARGE CONDITION: fair  Diet recommendation: Mod Carb  Filed Weights   11/30/12 0923  Weight: 52.164 kg (115 lb)    INITIAL HISTORY: [name] is a 57 y.o. female who has a past medical history of Seizures; Hypertension; Stroke; Asthma; Diabetes mellitus; Renal disorder; Chronic systolic CHF (congestive heart failure); Atrial fibrillation; Coagulopathy; History of DVT (deep vein thrombosis); Noncompliance; Hypothyroidism; VT (ventricular tachycardia); and Protein C deficiency. She presented with Hx of headache for long time and leg swelling. She is not able to provide any specifics. Note patient has a very complex medical history; she has history of nonischemic cardiomyopathy with EF of 10% status post AICD placement in 2007 in [place]. 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 It is not quite clear what her baseline mental status is, no family was available. She was brought in by ambulance. She also has a history of atrial fibrillation. She was not thought to be a good candidate for anticoagulation due to noncompliance. But in November 2013 she developed DVT with subsequent pulmonary embolism and  was found to have protein C deficiency. She had an IVC filter placement done and was started on Xarelto which is still taking. Of note she has long-standing history of headaches which has been worked up in the past apparently the current CT scan is unremarkable. Chest history of chronic pain and also has trouble verbalizing her symptoms which makes her history taking very difficult. While in ER she had been pointing to her chest occasionally stating that she has some chest pain her troponin was obtained which was slightly elevated at 0.1. Her EKG is unchanged. The past she had had positive troponins which were thought to be secondary to cardiac strain due to CHF. Of note her INR was also checked although she's not a Coumadin it was shown to be greater than 10. This was discussed with pharmacist and they stated that INR is not an accurate test in monitoring of Xarelto and is routinely severely elevated nonetheless they will followup all patient is here. Patient is complaining of leg pain but her legs do not appear to be swollen. In fact she does not showing signs of fluid overload. Patient was seen and examined on the day of discharge on 3 1/9. No new complaints. She is agreeable to go to SNF today for rehabilitation and social worker on board facilitating the discharge.   Consultations:  Neurology  Procedures: EEG 3/17 No epileptiform activity. Discussed with Dr. Thad Ranger.  HOSPITAL COURSE:   Apparent new Right facial droop and increased Right weakness  During the course of this hospitalization the patient was noted one night to have a new facial droop on the right.  She was also more lethargic. As there was a concern that she may have had a new event she underwent 2 CAT scans, both of which showed stable findings without any acute changes. It was felt then that maybe she had a seizure. She does have a known history of seizure disorder, and is on Keppra. EEG was done, which did not show any new  epileptiform activity. Other differentials include medication side effects or behavioral issues due to her significant strokes in the past. Patient is now back to baseline. MRI could not be done due to AICD. So this is not a new stroke.   Chest pain at rest  EKG's did not show acute ischemic changes. She has ruled out for ACS. Patient does not have any history of coronary artery disease. She has nonischemic cardiomyopathy with an EF of 10%. She appears to be well compensated currently. No further work up at this time. Reason for pain not clear as she is a poor historian. She has been seen by cardiology in the recent past and they do not plan to do any further workup.  Leg pain/Possible Gout  X rays did not reveal anything acute. Uric acid was significantly high. Given one dose of colchicine due to concern of significant interaction with amiodarone.  Can use Tylenol as needed for pain control. PCP to please consider her for Allopurinol.  History of DVT/PE and Protein C deficiency  She has a history of protein C deficiency and has had DVT and PE in the past. When she presented to the hospital her INR was elevated at greater than 10. She is on Xarelto. Unclear why INR was checked to begin with. I discussed this issue with the hematologist. We don't have a clear explanation. She was given 4 doses of vitamin K. INR did come down some. Subsequently, we stopped checking it because patient was not having any bleeding. Continue Xarelto.   History of stroke This is stable. She does have some degree of aphasia. She also has behavioral issues because of the effects of the stroke.  She has not been on a statin. I reviewed her records in the EMR. Unclear why she is not on this class of medications. She has never has been on it as far as we know. Patient unable to provide this information nor her family. I would prefer her PCP initiate this if there are no documented side effects. Since she did not have a new stroke  during this hospitalization there is no clear indication for same.  Hypokalemia  Potassium levels fluctuated during this hospitalization, but now is within normal limits.  History of Atrial fibrillation  Stable. Continue home medications.   Non-ischemic Cardiomyopathy  Currently appears to be euvolemic. Continue Spiriva, lactone. Continue Lasix as before. Continue lisinopril.  Headache  This is chronic. CT scan unremarkable. Treat symptomatically.   Nutrition  Tolerating diet. Nutritional supplements.  History of DM2  HBA1C was 6.3 in Feb. She was on oral agents at home. However, her blood sugars are running borderline low. We will manage this just with diet for now and stop her oral agent. CBGs to be checked at the skilled nursing facility. If the levels consistently stay above 150 sliding scale coverage may be considered.  PERTINENT LABS: The results of significant diagnostics from this hospitalization (including imaging, microbiology, ancillary and laboratory) are listed below for reference.    Labs: Basic Metabolic Panel:  Recent Labs Lab 11/27/12 1815 11/28/12 0400 11/29/12 0448 11/30/12 1856 12/01/12  0500 12/01/12 0712 12/03/12 1055  NA 140 141 142 139 137  --  133*  K 3.4* 3.3* 4.2 5.0 6.1* 5.1 4.9  CL 103 103 104 104 102  --  99  CO2 24 25 25 23 22   --  23  GLUCOSE 87 152* 126* 82 77  --  116*  BUN 18 19 21  28* 29*  --  22  CREATININE 0.79 0.91 0.91 0.98 0.99  --  0.91  CALCIUM 9.4 9.5 9.6 9.4 9.5  --  9.5  MG  --  1.7  --   --   --   --   --   PHOS  --  3.0  --   --   --   --   --    Liver Function Tests:  Recent Labs Lab 11/27/12 1815 11/28/12 0400 11/30/12 1856  AST 22 24 44*  ALT 10 10 17   ALKPHOS 67 70 83  BILITOT 1.7* 1.7* 1.7*  PROT 7.6 8.2 7.5  ALBUMIN 3.0* 3.1* 2.8*   CBC:  Recent Labs Lab 11/27/12 1815 11/28/12 0400 11/30/12 1856 12/01/12 0500  WBC 6.9 7.3 6.9 7.7  NEUTROABS 5.0  --   --   --   HGB 9.5* 9.7* 8.7* 8.8*  HCT 29.7*  31.2* 27.3* 27.1*  MCV 82.5 84.1 82.7 81.1  PLT 266 297 238 294   Cardiac Enzymes:  Recent Labs Lab 11/28/12 0400 11/28/12 1115 11/28/12 1519  TROPONINI <0.30 <0.30 <0.30   BNP: BNP (last 3 results)  Recent Labs  11/05/12 1607 11/06/12 0825 11/27/12 1821  PROBNP 1906.0* 12374.0* 9596.0*   CBG:  Recent Labs Lab 12/03/12 0748 12/03/12 1058 12/03/12 1644 12/03/12 2001 12/04/12 0716  GLUCAP 100* 121* 118* 97 112*     IMAGING STUDIES Dg Chest 1 View  11/27/2012  *RADIOLOGY REPORT*  Clinical Data: Chest pain  CHEST - 1 VIEW  Comparison: Prior chest x-ray 11/06/2012  Findings: Marked enlargement the cardiopericardial silhouettes. Chronic blunting of the right costophrenic angle appears similar to prior.  There is pulmonary vascular congestion which appears similar to slightly increased compared to prior.  A suggestion of bilateral upper lung nodules which not present on the relatively recent prior exam.  Query radiolucent cardiac monitor electrodes. Similar appearance of right middle lobe opacity compared to prior favored to reflect atelectasis versus scarring.  Stable left subclavian approach cardiac rhythm maintenance device.  IMPRESSION:  1.  Similar to slightly increased pulmonary vascular congestion. 2.  Stable marked cardiomegaly 3.  Suggestion of bilateral upper lung pulmonary nodules which were not present on the recent prior exam.  Query radiolucent cardiac electrodes.   Original Report Authenticated By: Malachy Moan, M.D.    Dg Chest 2 View  11/28/2012  *RADIOLOGY REPORT*  Clinical Data: Evaluate nodules seen on prior chest radiograph.  CHEST - 2 VIEW  Comparison: Chest x-ray 11/27/2012.  Findings: Again noted is severe cardiomegaly.  Marked pulmonary venous congestion with some Kerley B lines in the periphery of the lungs bilaterally, suggesting mild interstitial pulmonary edema. There is also thickening of the minor fissure.  Subsegmental atelectasis in the lower  lobes of the lungs bilaterally.  No definite consolidative airspace disease.  No significant pleural effusions.  Nodular opacities noted on the prior examination in the upper lungs appear similar on today's study, however, it is uncertain whether these represent true pulmonary nodules, or are simply related to distended pulmonary vessels.  Left sided pacemaker / AICD with lead tips projecting over  the expected location of the right atrium and right ventricular apex.  IMPRESSION: 1.  Cardiomegaly with mild interstitial pulmonary edema indicative of mild congestive heart failure. 2.  Nodular opacities projecting over the upper lungs appear similar to the recent prior examination, and may simply represent distended pulmonary vessels and edematous changes.  Underlying pulmonary nodules are difficult to entirely exclude, however, this would require further evaluation with chest CT if of clinical concern.  Alternatively, attention on follow-up radiographs after treatment for underlying congestive heart failure may demonstrate resolution of these findings.   Original Report Authenticated By: Trudie Reed, M.D.    Dg Chest 2 View  11/06/2012  *RADIOLOGY REPORT*  Clinical Data: Chest pain, history of diabetes  CHEST - 2 VIEW  Comparison: Chest x-ray of 09/14/2012  Findings: Moderate cardiomegaly is stable.  There does appear to be pulmonary vascular congestion present with small effusions, suggesting mild congestive heart failure.  An AICD lead is noted. The bones are osteopenic.  IMPRESSION: Probable mild CHF with small effusions and pulmonary vascular congestion.   Original Report Authenticated By: Dwyane Dee, M.D.    Dg Ankle 2 Views Right  11/28/2012  *RADIOLOGY REPORT*  Clinical Data: Right ankle pain.  RIGHT ANKLE - 2 VIEW  Comparison: No priors.  Findings: Two views of the right ankle demonstrate osteopenia.  No acute displaced fracture, subluxation, dislocation, joint or soft tissue abnormality.  Mild  degenerative changes of osteoarthritis are noted in the midfoot.  IMPRESSION: 1.  No acute radiographic abnormality of the right ankle. 2.  Osteopenia.   Original Report Authenticated By: Trudie Reed, M.D.    Ct Head Wo Contrast  11/30/2012  *RADIOLOGY REPORT*  Clinical Data: Stroke.  CT HEAD WITHOUT CONTRAST  Technique:  Contiguous axial images were obtained from the base of the skull through the vertex without contrast.  Comparison: CT head without contrast 11/29/2012  Findings: A remote left MCA territory infarct is not significantly changed.  Ex vacuo dilation of the left lateral ventricle is again noted.  There is no hemorrhage or mass lesion.  The right ventricle is of normal size.  No significant extra-axial fluid collection is present.  The paranasal sinuses and mastoid air cells are clear.  The osseous skull is intact.  IMPRESSION:  1.  Stable appearance of remote left MCA territory infarct. 2.  No acute intracranial abnormality or significant interval change over last several exams.   Original Report Authenticated By: Marin Roberts, M.D.    Ct Head Wo Contrast  11/29/2012  *RADIOLOGY REPORT*  Clinical Data: New onset right-sided flaccidness.  History of strokes.  CT HEAD WITHOUT CONTRAST  Technique:  Contiguous axial images were obtained from the base of the skull through the vertex without contrast.  Comparison: 11/27/2012  Findings: There is a large area of encephalomalacia in the left cerebral hemisphere involving posterior frontal, temporal, and parietal lobes.  This is stable since the previous study and is consistent with old infarct.  There is diffuse cerebral atrophy. Asymmetric dilatation of the left lateral ventricle is consistent with negative mass effect relating to the old infarct.  No acute mass effect or midline shift.  No abnormal extra-axial fluid collections.  Remaining gray-white matter junctions are distinct. Basal cisterns are not effaced.  Scattered low attenuation  changes in the deep white matter most consistent with small vessel ischemia.  The no evidence of acute intracranial hemorrhage.  No depressed skull fractures.  Visualized paranasal sinuses and mastoid air cells are not opacified.  Vascular calcifications. Generally stable appearance of the intracranial contents since previous study.  IMPRESSION: Old left MCA distribution infarct.  Diffuse atrophy and small vessel ischemic changes.  No acute intracranial abnormalities.   Original Report Authenticated By: Burman Nieves, M.D.    Ct Head Wo Contrast  11/27/2012  *RADIOLOGY REPORT*  Clinical Data: Headache.  Elevated INR.  CT HEAD WITHOUT CONTRAST  Technique:  Contiguous axial images were obtained from the base of the skull through the vertex without contrast.  Comparison: CT head without contrast 02/21/ 2014  Findings: The remote left MCA territory infarct is stable. Expected dilation of the lateral ventricles is evident.  Remote lacunar infarcts of the cerebellum are stable.  Mild white matter disease is unchanged.  No acute to infarct, hemorrhage, mass lesion is present.  The paranasal sinuses and mastoid air cells are clear.  IMPRESSION:  1.  No acute intracranial abnormality or significant interval change. 2.  Remote left MCA territory infarct.   Original Report Authenticated By: Marin Roberts, M.D.    Dg Foot 2 Views Right  11/28/2012  *RADIOLOGY REPORT*  Clinical Data: Pain in the foot and ankle.  RIGHT FOOT - 2 VIEW  Comparison: No priors.  Findings: Bones are osteopenic.  No acute displaced fracture, subluxation, dislocation, joint or soft tissue abnormality.  Mild degenerative changes of osteoarthritis in the midfoot and at the first MTP joint.  IMPRESSION: 1.  No acute radiographic abnormality in the right foot. 2.  Mild degenerative changes of osteoarthritis as above. 3.  Osteopenia.   Original Report Authenticated By: Trudie Reed, M.D.     DISCHARGE EXAMINATION: Filed Vitals:    12/03/12 1200 12/03/12 1600 12/03/12 2000 12/04/12 0500  BP: 115/82 118/80 114/76 111/70  Pulse: 62 65 69 65  Temp: 98.4 F (36.9 C) 97.9 F (36.6 C) 98.2 F (36.8 C) 97.6 F (36.4 C)  TempSrc: Oral Oral    Resp: 18 16 18 20   Height:      Weight:      SpO2: 97% 98% 96% 94%   General appearance: alert and distracted Resp: clear to auscultation bilaterally Cardio: regular rate and rhythm, S1, S2 normal, no murmur, click, rub or gallop GI: soft, non-tender; bowel sounds normal; no masses,  no organomegaly Neurologic: Old right hemiparesis  DISPOSITION: SNF  Discharge Orders   Future Appointments Provider Department Dept Phone   12/11/2012 3:30 PM Duke Salvia, MD [facility] Main Office [place]) [phone]   02/06/2013 1:45 PM Rollene Rotunda, MD [place] [facility] Main Office [place]) [phone]   Future Orders Complete By Expires     Diet Carb Modified  As directed     Discharge instructions  As directed     Comments:      Please check CBG 3 times a day. If CBG is greater than 150 consistently, please notify MD. Please check BMET in 1 week.    Increase activity slowly  As directed       Current Discharge Medication List    START taking these medications   Details  acetaminophen (TYLENOL) 325 MG tablet Take 2 tablets (650 mg total) by mouth every 6 (six) hours as needed.      CONTINUE these medications which have CHANGED   Details  ALPRAZolam (XANAX) 0.5 MG tablet Take 1 tablet (0.5 mg total) by mouth at bedtime as needed for sleep. For sleep Qty: 30 tablet, Refills: 0    furosemide (LASIX) 20 MG tablet Take 1 tablet (20 mg total) by mouth  as needed (for weight gain of 2 lbs from baseline). Qty: 30 tablet, Refills: 3    oxyCODONE-acetaminophen (PERCOCET) 10-325 MG per tablet Take 1 tablet by mouth every 6 (six) hours as needed for pain. Scheduled per pharmacy Qty: 30 tablet, Refills: 0      CONTINUE these medications which have NOT CHANGED   Details   albuterol (PROVENTIL) (2.5 MG/3ML) 0.083% nebulizer solution Take 2.5 mg by nebulization every 6 (six) hours as needed. For cough or wheeze    amiodarone (PACERONE) 400 MG tablet Take 1 tablet (400 mg total) by mouth daily. Qty: 30 tablet, Refills: 0    aspirin 81 MG EC tablet Take 1 tablet (81 mg total) by mouth daily. Swallow whole. Qty: 30 tablet, Refills: 12    escitalopram (LEXAPRO) 10 MG tablet Take 10 mg by mouth daily.    feeding supplement (ENSURE COMPLETE) LIQD Take 237 mLs by mouth 2 (two) times daily between meals.    levETIRAcetam (KEPPRA) 500 MG tablet Take 1,000 mg by mouth 2 (two) times daily.    levothyroxine (SYNTHROID, LEVOTHROID) 50 MCG tablet Take 50 mcg by mouth daily.    lisinopril (PRINIVIL,ZESTRIL) 5 MG tablet Take 5 mg by mouth daily.    metoprolol tartrate (LOPRESSOR) 25 MG tablet Take 25 mg by mouth 2 (two) times daily.    omeprazole (PRILOSEC) 20 MG capsule Take 20 mg by mouth 2 (two) times daily.    QUEtiapine (SEROQUEL XR) 50 MG TB24 Take 50 mg by mouth daily.    Rivaroxaban (XARELTO) 20 MG TABS Take 1 tablet (20 mg total) by mouth daily with supper. Qty: 30 tablet, Refills: 0    spironolactone (ALDACTONE) 25 MG tablet Take 1 tablet (25 mg total) by mouth daily. Qty: 30 tablet, Refills: 0      STOP taking these medications     glipiZIDE (GLUCOTROL XL) 5 MG 24 hr tablet        Follow-up Information   Follow up with [name], [name], MD. Schedule an appointment as soon as possible for a visit in 3 weeks.   Contact information:   [address] [place] [address] [phone]       TOTAL DISCHARGE TIME: 35 mins  [facility]  [facility] Pager [phone]  12/04/2012, 11:06 AM

## 2012-12-11 ENCOUNTER — Encounter: Payer: Medicare Other | Admitting: Internal Medicine

## 2012-12-17 ENCOUNTER — Telehealth: Payer: Self-pay

## 2012-12-17 NOTE — Telephone Encounter (Signed)
FYI: We received a fax from Derrek Dorethea Clan PT at [facility] stating that pt refused her PT visit that was scheduled for 12-12-12

## 2013-01-01 ENCOUNTER — Encounter: Payer: Medicare Other | Admitting: Internal Medicine

## 2013-01-05 ENCOUNTER — Inpatient Hospital Stay (HOSPITAL_BASED_OUTPATIENT_CLINIC_OR_DEPARTMENT_OTHER)
Admission: EM | Admit: 2013-01-05 | Discharge: 2013-01-10 | DRG: 292 | Disposition: A | Discharge status: Home Health | Payer: Medicare Other | Attending: Internal Medicine | Admitting: Internal Medicine

## 2013-01-05 ENCOUNTER — Encounter (HOSPITAL_BASED_OUTPATIENT_CLINIC_OR_DEPARTMENT_OTHER): Payer: Self-pay | Admitting: *Deleted

## 2013-01-05 DIAGNOSIS — Z8249 Family history of ischemic heart disease and other diseases of the circulatory system: Secondary | ICD-10-CM

## 2013-01-05 DIAGNOSIS — D689 Coagulation defect, unspecified: Secondary | ICD-10-CM

## 2013-01-05 DIAGNOSIS — Y9210 Unspecified residential institution as the place of occurrence of the external cause: Secondary | ICD-10-CM | POA: Diagnosis not present

## 2013-01-05 DIAGNOSIS — E039 Hypothyroidism, unspecified: Secondary | ICD-10-CM | POA: Diagnosis present

## 2013-01-05 DIAGNOSIS — G40909 Epilepsy, unspecified, not intractable, without status epilepticus: Secondary | ICD-10-CM | POA: Diagnosis present

## 2013-01-05 DIAGNOSIS — I5023 Acute on chronic systolic (congestive) heart failure: Principal | ICD-10-CM | POA: Diagnosis present

## 2013-01-05 DIAGNOSIS — D649 Anemia, unspecified: Secondary | ICD-10-CM | POA: Diagnosis present

## 2013-01-05 DIAGNOSIS — Z79899 Other long term (current) drug therapy: Secondary | ICD-10-CM

## 2013-01-05 DIAGNOSIS — Z7982 Long term (current) use of aspirin: Secondary | ICD-10-CM

## 2013-01-05 DIAGNOSIS — I472 Ventricular tachycardia, unspecified: Secondary | ICD-10-CM | POA: Diagnosis not present

## 2013-01-05 DIAGNOSIS — E875 Hyperkalemia: Secondary | ICD-10-CM | POA: Diagnosis not present

## 2013-01-05 DIAGNOSIS — E119 Type 2 diabetes mellitus without complications: Secondary | ICD-10-CM | POA: Diagnosis present

## 2013-01-05 DIAGNOSIS — R32 Unspecified urinary incontinence: Secondary | ICD-10-CM | POA: Diagnosis not present

## 2013-01-05 DIAGNOSIS — Z880 Allergy status to penicillin: Secondary | ICD-10-CM

## 2013-01-05 DIAGNOSIS — Z882 Allergy status to sulfonamides status: Secondary | ICD-10-CM

## 2013-01-05 DIAGNOSIS — I429 Cardiomyopathy, unspecified: Secondary | ICD-10-CM

## 2013-01-05 DIAGNOSIS — Z9119 Patient's noncompliance with other medical treatment and regimen: Secondary | ICD-10-CM

## 2013-01-05 DIAGNOSIS — I4729 Other ventricular tachycardia: Secondary | ICD-10-CM | POA: Diagnosis not present

## 2013-01-05 DIAGNOSIS — Y849 Medical procedure, unspecified as the cause of abnormal reaction of the patient, or of later complication, without mention of misadventure at the time of the procedure: Secondary | ICD-10-CM | POA: Diagnosis not present

## 2013-01-05 DIAGNOSIS — I509 Heart failure, unspecified: Secondary | ICD-10-CM | POA: Diagnosis present

## 2013-01-05 DIAGNOSIS — Z91199 Patient's noncompliance with other medical treatment and regimen due to unspecified reason: Secondary | ICD-10-CM

## 2013-01-05 DIAGNOSIS — R29898 Other symptoms and signs involving the musculoskeletal system: Secondary | ICD-10-CM | POA: Diagnosis present

## 2013-01-05 DIAGNOSIS — Z86718 Personal history of other venous thrombosis and embolism: Secondary | ICD-10-CM

## 2013-01-05 DIAGNOSIS — IMO0002 Reserved for concepts with insufficient information to code with codable children: Secondary | ICD-10-CM | POA: Diagnosis not present

## 2013-01-05 DIAGNOSIS — I69998 Other sequelae following unspecified cerebrovascular disease: Secondary | ICD-10-CM

## 2013-01-05 DIAGNOSIS — Z95810 Presence of automatic (implantable) cardiac defibrillator: Secondary | ICD-10-CM

## 2013-01-05 DIAGNOSIS — Z7901 Long term (current) use of anticoagulants: Secondary | ICD-10-CM

## 2013-01-05 DIAGNOSIS — I4891 Unspecified atrial fibrillation: Secondary | ICD-10-CM

## 2013-01-05 DIAGNOSIS — D6859 Other primary thrombophilia: Secondary | ICD-10-CM | POA: Diagnosis present

## 2013-01-05 DIAGNOSIS — J45909 Unspecified asthma, uncomplicated: Secondary | ICD-10-CM | POA: Diagnosis present

## 2013-01-05 DIAGNOSIS — K5900 Constipation, unspecified: Secondary | ICD-10-CM

## 2013-01-05 DIAGNOSIS — I69320 Aphasia following cerebral infarction: Secondary | ICD-10-CM

## 2013-01-05 DIAGNOSIS — I428 Other cardiomyopathies: Secondary | ICD-10-CM | POA: Diagnosis present

## 2013-01-05 DIAGNOSIS — G8190 Hemiplegia, unspecified affecting unspecified side: Secondary | ICD-10-CM | POA: Diagnosis present

## 2013-01-05 DIAGNOSIS — I69920 Aphasia following unspecified cerebrovascular disease: Secondary | ICD-10-CM

## 2013-01-05 DIAGNOSIS — Z86711 Personal history of pulmonary embolism: Secondary | ICD-10-CM

## 2013-01-05 DIAGNOSIS — Z87891 Personal history of nicotine dependence: Secondary | ICD-10-CM

## 2013-01-05 NOTE — ED Notes (Signed)
Pt's family states pt has had bilateral lower extremity swelling that stated two weeks ago. Pt states that she has been feeling sob. Denies any CP. Pt with hx of CVA. Hx of recent DVT's bilateral lower extremities.

## 2013-01-06 ENCOUNTER — Inpatient Hospital Stay (HOSPITAL_COMMUNITY): Payer: Medicare Other

## 2013-01-06 ENCOUNTER — Encounter (HOSPITAL_COMMUNITY): Payer: Self-pay | Admitting: *Deleted

## 2013-01-06 ENCOUNTER — Emergency Department (HOSPITAL_BASED_OUTPATIENT_CLINIC_OR_DEPARTMENT_OTHER): Payer: Medicare Other

## 2013-01-06 DIAGNOSIS — I428 Other cardiomyopathies: Secondary | ICD-10-CM

## 2013-01-06 DIAGNOSIS — I509 Heart failure, unspecified: Secondary | ICD-10-CM

## 2013-01-06 DIAGNOSIS — I4891 Unspecified atrial fibrillation: Secondary | ICD-10-CM

## 2013-01-06 DIAGNOSIS — D689 Coagulation defect, unspecified: Secondary | ICD-10-CM

## 2013-01-06 DIAGNOSIS — I5023 Acute on chronic systolic (congestive) heart failure: Principal | ICD-10-CM

## 2013-01-06 LAB — BASIC METABOLIC PANEL
BUN: 21 mg/dL (ref 6–23)
BUN: 22 mg/dL (ref 6–23)
Calcium: 9.7 mg/dL (ref 8.4–10.5)
Chloride: 105 mEq/L (ref 96–112)
Creatinine, Ser: 1.1 mg/dL (ref 0.50–1.10)
Creatinine, Ser: 1.22 mg/dL — ABNORMAL HIGH (ref 0.50–1.10)
GFR calc Af Amer: 57 mL/min — ABNORMAL LOW (ref >90)
GFR calc non Af Amer: 56 mL/min — ABNORMAL LOW (ref >90)
Glucose, Bld: 108 mg/dL — ABNORMAL HIGH (ref 70–99)
Glucose, Bld: 104 mg/dL — ABNORMAL HIGH (ref 70–99)
Sodium: 139 mEq/L (ref 135–145)

## 2013-01-06 LAB — TROPONIN I: Troponin I: <0.3 ng/mL (ref <0.30)

## 2013-01-06 LAB — GLUCOSE, CAPILLARY
Glucose-Capillary: 96 mg/dL (ref 70–99)
Glucose-Capillary: 97 mg/dL (ref 70–99)

## 2013-01-06 LAB — URINALYSIS, ROUTINE W REFLEX MICROSCOPIC
Bilirubin Urine: NEGATIVE
Glucose, UA: NEGATIVE mg/dL
Hgb urine dipstick: NEGATIVE
Ketones, ur: NEGATIVE mg/dL
pH: 5 (ref 5.0–8.0)

## 2013-01-06 LAB — PRO B NATRIURETIC PEPTIDE: Pro B Natriuretic peptide (BNP): 10506 pg/mL — ABNORMAL HIGH (ref 0–125)

## 2013-01-06 LAB — LIPASE, BLOOD: Lipase: 13 U/L (ref 11–59)

## 2013-01-06 MED ORDER — POTASSIUM CHLORIDE CRYS ER 20 MEQ PO TBCR
40.0000 meq | EXTENDED_RELEASE_TABLET | Freq: Two times a day (BID) | ORAL | Status: DC
  Filled 2013-01-06 (×4): qty 2

## 2013-01-06 MED ORDER — ONDANSETRON HCL 4 MG/2ML IJ SOLN: 4.0000 mg | Freq: Four times a day (QID) | INTRAMUSCULAR | Status: DC | PRN

## 2013-01-06 MED ORDER — ESCITALOPRAM OXALATE 10 MG PO TABS
10.0000 mg | ORAL_TABLET | Freq: Every day | ORAL | Status: DC
  Administered 2013-01-07 – 2013-01-10 (×3): 10 mg via ORAL
  Filled 2013-01-06 (×5): qty 1

## 2013-01-06 MED ORDER — OXYCODONE HCL 5 MG PO TABS
5.0000 mg | ORAL_TABLET | Freq: Four times a day (QID) | ORAL | Status: DC | PRN
  Filled 2013-01-06 (×2): qty 1

## 2013-01-06 MED ORDER — LORAZEPAM 2 MG/ML IJ SOLN
0.5000 mg | Freq: Once | INTRAMUSCULAR | Status: AC
  Administered 2013-01-07: 0.5 mg via INTRAVENOUS
  Filled 2013-01-06: qty 1

## 2013-01-06 MED ORDER — SODIUM CHLORIDE 0.9 % IJ SOLN
3.0000 mL | INTRAMUSCULAR | Status: DC | PRN
  Administered 2013-01-06 – 2013-01-08 (×5): 3 mL via INTRAVENOUS

## 2013-01-06 MED ORDER — ACETAMINOPHEN 325 MG PO TABS: 650.0000 mg | ORAL_TABLET | ORAL | Status: DC | PRN

## 2013-01-06 MED ORDER — POLYETHYLENE GLYCOL 3350 17 G PO PACK
17.0000 g | PACK | Freq: Two times a day (BID) | ORAL | Status: DC
  Administered 2013-01-06 – 2013-01-10 (×2): 17 g via ORAL
  Filled 2013-01-06 (×10): qty 1

## 2013-01-06 MED ORDER — KETOROLAC TROMETHAMINE 30 MG/ML IJ SOLN
INTRAMUSCULAR | Status: AC
  Filled 2013-01-06: qty 1

## 2013-01-06 MED ORDER — SODIUM CHLORIDE 0.9 % IV SOLN
250.0000 mL | INTRAVENOUS | Status: DC | PRN
  Administered 2013-01-07: 250 mL via INTRAVENOUS

## 2013-01-06 MED ORDER — OXYCODONE-ACETAMINOPHEN 10-325 MG PO TABS: 1.0000 | ORAL_TABLET | Freq: Four times a day (QID) | ORAL | Status: DC | PRN

## 2013-01-06 MED ORDER — OXYCODONE-ACETAMINOPHEN 5-325 MG PO TABS
1.0000 | ORAL_TABLET | Freq: Four times a day (QID) | ORAL | Status: DC | PRN
  Administered 2013-01-06: 1 via ORAL
  Filled 2013-01-06 (×5): qty 1

## 2013-01-06 MED ORDER — LISINOPRIL 5 MG PO TABS
5.0000 mg | ORAL_TABLET | Freq: Every day | ORAL | Status: DC
  Administered 2013-01-07: 5 mg via ORAL
  Filled 2013-01-06 (×2): qty 1

## 2013-01-06 MED ORDER — ASPIRIN 81 MG PO TBEC: 81.0000 mg | DELAYED_RELEASE_TABLET | Freq: Every day | ORAL | Status: DC

## 2013-01-06 MED ORDER — LEVETIRACETAM 500 MG PO TABS
1000.0000 mg | ORAL_TABLET | Freq: Two times a day (BID) | ORAL | Status: DC
  Filled 2013-01-06 (×2): qty 2

## 2013-01-06 MED ORDER — ASPIRIN EC 81 MG PO TBEC
81.0000 mg | DELAYED_RELEASE_TABLET | Freq: Every day | ORAL | Status: DC
  Administered 2013-01-07 – 2013-01-10 (×3): 81 mg via ORAL
  Filled 2013-01-06 (×5): qty 1

## 2013-01-06 MED ORDER — METOPROLOL TARTRATE 25 MG PO TABS
25.0000 mg | ORAL_TABLET | Freq: Two times a day (BID) | ORAL | Status: DC
  Administered 2013-01-06 – 2013-01-10 (×6): 25 mg via ORAL
  Filled 2013-01-06 (×10): qty 1

## 2013-01-06 MED ORDER — RIVAROXABAN 20 MG PO TABS
20.0000 mg | ORAL_TABLET | Freq: Every day | ORAL | Status: DC
  Administered 2013-01-06 – 2013-01-09 (×3): 20 mg via ORAL
  Filled 2013-01-06 (×6): qty 1

## 2013-01-06 MED ORDER — QUETIAPINE FUMARATE ER 50 MG PO TB24
50.0000 mg | ORAL_TABLET | Freq: Every day | ORAL | Status: DC
  Administered 2013-01-08 – 2013-01-10 (×2): 50 mg via ORAL
  Filled 2013-01-06 (×5): qty 1

## 2013-01-06 MED ORDER — ALPRAZOLAM 0.5 MG PO TABS: 0.5000 mg | ORAL_TABLET | Freq: Every evening | ORAL | Status: DC | PRN

## 2013-01-06 MED ORDER — KETOROLAC TROMETHAMINE 60 MG/2ML IM SOLN
30.0000 mg | Freq: Once | INTRAMUSCULAR | Status: AC
  Administered 2013-01-06: 30 mg via INTRAMUSCULAR
  Filled 2013-01-06: qty 2

## 2013-01-06 MED ORDER — PANTOPRAZOLE SODIUM 40 MG PO TBEC
40.0000 mg | DELAYED_RELEASE_TABLET | Freq: Every day | ORAL | Status: DC
  Administered 2013-01-07 – 2013-01-10 (×3): 40 mg via ORAL
  Filled 2013-01-06 (×6): qty 1

## 2013-01-06 MED ORDER — FUROSEMIDE 10 MG/ML IJ SOLN
40.0000 mg | Freq: Four times a day (QID) | INTRAMUSCULAR | Status: DC
  Filled 2013-01-06: qty 4

## 2013-01-06 MED ORDER — SODIUM CHLORIDE 0.9 % IV SOLN
1000.0000 mg | Freq: Two times a day (BID) | INTRAVENOUS | Status: DC
  Administered 2013-01-06 – 2013-01-10 (×8): 1000 mg via INTRAVENOUS
  Filled 2013-01-06 (×10): qty 10

## 2013-01-06 MED ORDER — ENSURE COMPLETE PO LIQD
237.0000 mL | Freq: Two times a day (BID) | ORAL | Status: DC
  Administered 2013-01-07 – 2013-01-10 (×2): 237 mL via ORAL

## 2013-01-06 MED ORDER — KETOROLAC TROMETHAMINE 30 MG/ML IJ SOLN
30.0000 mg | Freq: Once | INTRAMUSCULAR | Status: AC
  Administered 2013-01-06: 30 mg via INTRAVENOUS

## 2013-01-06 MED ORDER — FUROSEMIDE 10 MG/ML IJ SOLN
60.0000 mg | Freq: Three times a day (TID) | INTRAMUSCULAR | Status: DC
  Administered 2013-01-07 – 2013-01-10 (×11): 60 mg via INTRAVENOUS
  Filled 2013-01-06 (×14): qty 6

## 2013-01-06 MED ORDER — FUROSEMIDE 10 MG/ML IJ SOLN
40.0000 mg | Freq: Once | INTRAMUSCULAR | Status: AC
  Administered 2013-01-06: 40 mg via INTRAVENOUS
  Filled 2013-01-06: qty 4

## 2013-01-06 MED ORDER — ACETAMINOPHEN 325 MG PO TABS: 650.0000 mg | ORAL_TABLET | Freq: Four times a day (QID) | ORAL | Status: DC | PRN

## 2013-01-06 MED ORDER — ALBUTEROL SULFATE (5 MG/ML) 0.5% IN NEBU: 2.5000 mg | INHALATION_SOLUTION | RESPIRATORY_TRACT | Status: DC | PRN

## 2013-01-06 MED ORDER — DOCUSATE SODIUM 100 MG PO CAPS
100.0000 mg | ORAL_CAPSULE | Freq: Two times a day (BID) | ORAL | Status: DC
  Administered 2013-01-10: 100 mg via ORAL
  Filled 2013-01-06 (×10): qty 1

## 2013-01-06 MED ORDER — ALPRAZOLAM 0.5 MG PO TABS: 0.5000 mg | ORAL_TABLET | Freq: Two times a day (BID) | ORAL | Status: DC | PRN

## 2013-01-06 MED ORDER — MAGNESIUM SULFATE 40 MG/ML IJ SOLN
2.0000 g | Freq: Once | INTRAMUSCULAR | Status: AC
  Administered 2013-01-06: 2 g via INTRAVENOUS
  Filled 2013-01-06: qty 50

## 2013-01-06 MED ORDER — LEVOTHYROXINE SODIUM 50 MCG PO TABS
50.0000 ug | ORAL_TABLET | Freq: Every day | ORAL | Status: DC
  Administered 2013-01-08 – 2013-01-10 (×4): 50 ug via ORAL
  Filled 2013-01-06 (×6): qty 1

## 2013-01-06 MED ORDER — SODIUM CHLORIDE 0.9 % IJ SOLN
3.0000 mL | Freq: Two times a day (BID) | INTRAMUSCULAR | Status: DC
  Administered 2013-01-06 – 2013-01-07 (×5): 3 mL via INTRAVENOUS

## 2013-01-06 MED ORDER — AMIODARONE HCL 200 MG PO TABS
400.0000 mg | ORAL_TABLET | Freq: Every day | ORAL | Status: DC
  Administered 2013-01-07 – 2013-01-10 (×3): 400 mg via ORAL
  Filled 2013-01-06 (×6): qty 2

## 2013-01-06 NOTE — Consult Note (Signed)
Advance Heart Failure  Referring Physician:  Primary Physician: Primary Cardiologist:  EP: Dr Graciela Husbands Hematology: Dr Myna Hidalgo  Reason for Consultation: Recurrent systolic HF   HPI: Elizabeth Love is 57 year old with a very complicated PMH of chronic systolic heart failure due to NICM (EF 10% 04/2012), status post AICD implantation (placed [facility] 2007), hypothyroidism,  seizure disorder, HTN, DM2. 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 A fib (on Xarelto),  RLE DVT, S/P IVC filter, and protein C deficiency.   Has had multiple admissions for various problems.  Most recently, admitted to [facility] 01/05/13 with bilateral lower extremity edema and volume overload. Started on 40 mg  IV lasix every 6 hours.  Pertinent admission labs included Pro BNP 10506, CEs negative, K 3.9, and Creatinine 1.1. CXR- Increasing congestive heart failure.   She has refused all medications today with the exception of Percocet. She cannot, or will not provide any meaningful history. She cries any time anyone touches her.  I have contacted her brother Elizabeth Love, to get a baseline. She lives with her brother. She requires assistance with ADLs. [facility] following HHRN and HHPT Weight at home 110-118 pounds. Her brother says that she refuses medications and they have to watch her to verify she takes medications other wise she hides them. Chronic lower extremity edema. He says she will not keep her legs up at home.   Review of Systems: Requesting IV .  Unable to obtain due to expressive aphasia and unwillingness to communicate with staff.    Cardiac Review of Systems: {Y] = yes [ ]  = no  Chest Pain [    ]  Resting SOB [   ] Exertional SOB  [  ]  Orthopnea [  ]   Pedal Edema [ Y  ]    Palpitations [  ] Syncope  [  ]   Presyncope [   ]  General Review of Systems: [Y] = yes [  ]=no Constitional: recent weight change [  ]; anorexia [  ]; fatigue [ Y ]; nausea [  ]; night sweats [  ]; fever [  ]; or chills [  ];                                                                                                                                           Dental: poor dentition[  ];  Eye : blurred vision [  ]; diplopia [   ]; vision changes [  ];  Amaurosis fugax[  ]; Resp: cough [  ];  wheezing[  ];  hemoptysis[  ]; shortness of breath[  ]; paroxysmal nocturnal dyspnea[  ]; dyspnea on exertion[  ]; or orthopnea[  ];  GI:  gallstones[  ], vomiting[  ];  dysphagia[  ]; melena[  ];  hematochezia [  ]; heartburn[  ];   Hx of  Colonoscopy[  ];  GU: kidney stones [  ]; hematuria[  ];   dysuria [  ];  nocturia[  ];  history of     obstruction [  ];                 Skin: rash, swelling[Y  ];, hair loss[  ];  peripheral edema[  ];  or itching[  ]; Musculosketetal: myalgias[  ];  joint swelling[ Y ];  joint erythema[  ];  joint pain[Y  ];  back pain[  ];  Heme/Lymph: bruising[  ];  bleeding[  ];  anemia[  ];  Neuro: TIA[  ];  headaches[  ];  stroke[ y ];  vertigo[  ];  seizures[ y ];   paresthesias[  ];  difficulty walking[ y ];  Psych:depression[  ]; anxiety[  ];  Endocrine: diabetes[Y  ];  thyroid dysfunction[ Y ];  Immunizations: Flu [  ]; Pneumococcal[  ];  Other:  Past Medical History  Diagnosis Date  . Seizures   . Hypertension   . Stroke     2007 - R sided weakness and expressive aphasia with h/o behavioral problems related to this  . Asthma   . Diabetes mellitus   . 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 Chronic systolic CHF (congestive heart failure)     a. s/p AICD ~2007 ([employer]) - previous care [facility]. No known hx CAD. b. EF 10% by echo 04/2012.  Marland Kitchen Atrial fibrillation     04/2012 - not felt to be an anticoag candidate long term at that time, but later had DVT so was on placed on Xarelto  . Coagulopathy     "Protein C deficiency"  . History of DVT (deep vein thrombosis)     a. 06/2012: RLE DVT, + protein C deficiency, placed on Xarelto. s/p IVC filter  . Noncompliance     Due to mental status,  family has had to coax her to take medicines  . Hypothyroidism   . VT (ventricular tachycardia)     a. h/o ICD ~2007-2008. b. Adm 08/2012 with UTI, had VT/ICD shock x 5 (hypokalemic)  . Protein C deficiency     Medications Prior to Admission  Medication Sig Dispense Refill  . acetaminophen (TYLENOL) 325 MG tablet Take 2 tablets (650 mg total) by mouth every 6 (six) hours as needed.      Marland Kitchen albuterol (PROVENTIL) (2.5 MG/3ML) 0.083% nebulizer solution Take 2.5 mg by nebulization every 6 (six) hours as needed. For cough or wheeze      . ALPRAZolam (XANAX) 0.5 MG tablet Take 1 tablet (0.5 mg total) by mouth at bedtime as needed for sleep. For sleep  30 tablet  0  . amiodarone (PACERONE) 400 MG tablet Take 1 tablet (400 mg total) by mouth daily.  30 tablet  0  . aspirin 81 MG EC tablet Take 1 tablet (81 mg total) by mouth daily. Swallow whole.  30 tablet  12  . escitalopram (LEXAPRO) 10 MG tablet Take 10 mg by mouth daily.      . feeding supplement (ENSURE COMPLETE) LIQD Take 237 mLs by mouth 2 (two) times daily between meals.      . furosemide (LASIX) 20 MG tablet Take 1 tablet (20 mg total) by mouth as needed (for weight gain of 2 lbs from baseline).  30 tablet  3  . levETIRAcetam (KEPPRA) 500 MG tablet Take 1,000 mg by mouth 2 (two) times daily.      Marland Kitchen  levothyroxine (SYNTHROID, LEVOTHROID) 50 MCG tablet Take 50 mcg by mouth daily.      Marland Kitchen lisinopril (PRINIVIL,ZESTRIL) 5 MG tablet Take 5 mg by mouth daily.      . metoprolol tartrate (LOPRESSOR) 25 MG tablet Take 25 mg by mouth 2 (two) times daily.      Marland Kitchen omeprazole (PRILOSEC) 20 MG capsule Take 20 mg by mouth 2 (two) times daily.      Marland Kitchen oxyCODONE-acetaminophen (PERCOCET) 10-325 MG per tablet Take 1 tablet by mouth every 6 (six) hours as needed for pain. Scheduled per pharmacy  30 tablet  0  . QUEtiapine (SEROQUEL XR) 50 MG TB24 Take 50 mg by mouth daily.      . Rivaroxaban (XARELTO) 20 MG TABS Take 1 tablet (20 mg total) by mouth daily with  supper.  30 tablet  0  . spironolactone (ALDACTONE) 25 MG tablet Take 1 tablet (25 mg total) by mouth daily.  30 tablet  0     . amiodarone  400 mg Oral Daily  . aspirin EC  81 mg Oral Daily  . docusate sodium  100 mg Oral BID  . escitalopram  10 mg Oral Daily  . feeding supplement  237 mL Oral BID BM  . furosemide  40 mg Intravenous Q6H  . levETIRAcetam  1,000 mg Oral BID  . levothyroxine  50 mcg Oral QAC breakfast  . lisinopril  5 mg Oral Daily  . metoprolol tartrate  25 mg Oral BID  . pantoprazole  40 mg Oral Daily  . polyethylene glycol  17 g Oral BID  . potassium chloride  40 mEq Oral BID  . QUEtiapine  50 mg Oral Daily  . Rivaroxaban  20 mg Oral Q supper  . sodium chloride  3 mL Intravenous Q12H    Infusions:    Allergies  Allergen Reactions  . Penicillins Rash  . Sulfa Antibiotics Rash    History   Social History  . Marital Status: Single    Spouse Name: [name]    Number of Children: [name]  . Years of Education: [name]   Occupational History  . Not on file.   Social History Main Topics  . Smoking status: Former Games developer  . Smokeless tobacco: Not on file  . Alcohol Use: No  . Drug Use: No  . Sexually Active: Not on file   Other Topics Concern  . Not on file   Social History Narrative  . No narrative on file    Family History  Problem Relation Age of Onset  . Coronary artery disease Neg Hx   . Hypertension Mother     PHYSICAL EXAM: Filed Vitals:   01/06/13 0935  BP: 117/77  Pulse: 65  Temp: 97.3 F (36.3 C)  Resp: 18     Intake/Output Summary (Last 24 hours) at 01/06/13 1323 Last data filed at 01/06/13 1321  Gross per 24 hour  Intake    110 ml  Output    200 ml  Net    -90 ml    General:  Chronically ill appearing. No respiratory difficulty Sitting in recliner. Answers some questions then falls back to sleep. Cries when touched HEENT: normal Neck: supple. JVD to jaw. Carotids 2+ bilat; no bruits. No lymphadenopathy or thryomegaly  appreciated. Cor: PMI nondisplaced. Irregular rate & rhythm. No rubs, loud S 3   3/6  MR. Lungs: clear Abdomen: soft, nontender, nondistended. No hepatosplenomegaly. No bruits or masses. Good bowel sounds. Extremities: no cyanosis, clubbing, rash, R  and LLE 3+ edema RUE and RLE hemipharesis Neuro: Awake but falls asleep quickly. +some expressive aphasia. R sided hemiparesis.    Results for orders placed during the hospital encounter of 01/05/13 (from the past 24 hour(s))  URINALYSIS, ROUTINE W REFLEX MICROSCOPIC     Status: None   Collection Time    01/06/13  1:03 AM      Result Value Range   Color, Urine YELLOW  YELLOW   APPearance CLEAR  CLEAR   Specific Gravity, Urine 1.014  1.005 - 1.030   pH 5.0  5.0 - 8.0   Glucose, UA NEGATIVE  NEGATIVE mg/dL   Hgb urine dipstick NEGATIVE  NEGATIVE   Bilirubin Urine NEGATIVE  NEGATIVE   Ketones, ur NEGATIVE  NEGATIVE mg/dL   Protein, ur NEGATIVE  NEGATIVE mg/dL   Urobilinogen, UA 1.0  0.0 - 1.0 mg/dL   Nitrite NEGATIVE  NEGATIVE   Leukocytes, UA NEGATIVE  NEGATIVE  CBC WITH DIFFERENTIAL     Status: Abnormal   Collection Time    01/06/13  1:54 AM      Result Value Range   WBC 6.6  4.0 - 10.5 K/uL   RBC 3.99  3.87 - 5.11 MIL/uL   Hemoglobin 10.1 (*) 12.0 - 15.0 g/dL   HCT 16.1 (*) 09.6 - 04.5 %   MCV 79.7  78.0 - 100.0 fL   MCH 25.3 (*) 26.0 - 34.0 pg   MCHC 31.8  30.0 - 36.0 g/dL   RDW 40.9 (*) 81.1 - 91.4 %   Platelets 278  150 - 400 K/uL   Neutrophils Relative 66  43 - 77 %   Lymphocytes Relative 24  12 - 46 %   Monocytes Relative 9  3 - 12 %   Eosinophils Relative 1  0 - 5 %   Basophils Relative 0  0 - 1 %   Neutro Abs 4.3  1.7 - 7.7 K/uL   Lymphs Abs 1.6  0.7 - 4.0 K/uL   Monocytes Absolute 0.6  0.1 - 1.0 K/uL   Eosinophils Absolute 0.1  0.0 - 0.7 K/uL   Basophils Absolute 0.0  0.0 - 0.1 K/uL   RBC Morphology RARE NRBCs     WBC Morphology VACUOLATED NEUTROPHILS     Smear Review LARGE PLATELETS PRESENT    BASIC METABOLIC  PANEL     Status: Abnormal   Collection Time    01/06/13  1:54 AM      Result Value Range   Sodium 139  135 - 145 mEq/L   Potassium 3.9  3.5 - 5.1 mEq/L   Chloride 103  96 - 112 mEq/L   CO2 24  19 - 32 mEq/L   Glucose, Bld 108 (*) 70 - 99 mg/dL   BUN 21  6 - 23 mg/dL   Creatinine, Ser 7.82  0.50 - 1.10 mg/dL   Calcium 9.7  8.4 - 95.6 mg/dL   GFR calc non Af Amer 56 (*) >90 mL/min   GFR calc Af Amer 65 (*) >90 mL/min  TROPONIN I     Status: None   Collection Time    01/06/13  1:54 AM      Result Value Range   Troponin I <0.30  <0.30 ng/mL  PRO B NATRIURETIC PEPTIDE     Status: Abnormal   Collection Time    01/06/13  1:54 AM      Result Value Range   Pro B Natriuretic peptide (BNP) 10506.0 (*) 0 - 125  pg/mL  GLUCOSE, CAPILLARY     Status: None   Collection Time    01/06/13  6:47 AM      Result Value Range   Glucose-Capillary 96  70 - 99 mg/dL   Comment 1 Notify RN    TROPONIN I     Status: None   Collection Time    01/06/13  7:30 AM      Result Value Range   Troponin I <0.30  <0.30 ng/mL  MAGNESIUM     Status: None   Collection Time    01/06/13  7:30 AM      Result Value Range   Magnesium 1.8  1.5 - 2.5 mg/dL  LIPASE, BLOOD     Status: None   Collection Time    01/06/13  9:35 AM      Result Value Range   Lipase 13  11 - 59 U/L  GLUCOSE, CAPILLARY     Status: Abnormal   Collection Time    01/06/13 11:32 AM      Result Value Range   Glucose-Capillary 100 (*) 70 - 99 mg/dL   Dg Chest 2 View  1/61/0960  *RADIOLOGY REPORT*  Clinical Data: Chest pain and leg swelling.  Hypertension. Diabetes.  Asthma.  CHEST - 2 VIEW  Comparison: 11/28/2012  Findings: Pacer / AICD device, unchanged in position.  Midline trachea.  Marked enlargement of  cardiopericardial silhouette.  No right-sided pleural effusion.  Probable small left pleural effusion.  No pneumothorax.  Increase in moderate interstitial edema. Suboptimal evaluation of the inferior left hemithorax on the frontal view.   IMPRESSION: Increasing moderate congestive heart failure.  Probable small left pleural effusion.  Marked enlargement of the cardiopericardial silhouette. Cardiomegaly.  Cannot exclude pericardial effusion.   Original Report Authenticated By: Jeronimo Greaves, M.D.      ASSESSMENT: 1. A/C systolic heart failure  - ECHO 10% 04/2012 2. Protein C Deficiency 3. A Fib - on Xarelto 4. Seizure disorder- on chronic Keppra 5. H/O DVT  6. H/C CVA - residual right-sided weakness and expressive aphasia.  7. Hypothyroidism 8. Noncompliance 9. NSVT  PLAN/DISCUSSION:  Elizabeth Head is a 57 year old with a complex medical history. Readmitted with volume overload likely in the setting of noncompliance. Presently she is refusing all medications. Volume status does appear elevated. She needs IV lasix .  I spoke with her brother and he says she often refuses medications and requires assistance with all medications.  Waiting on PICC placement.   Would benefit from pysch consult to determine capacity. Consult SW . Consult PT/OT.  ? SNF placement.   Tonye Becket, NP-C   Patient seen and examined with Tonye Becket, NP. We discussed all aspects of the encounter. I agree with the assessment and plan as stated above.   This is a nearly impossible situation. She has multiple severe comorbidities and now has had 6 admissions in the past 6 months. She currently has markedly decompensated HF with probable low output. On sitting with her and looking at her chart it appears clears to me that she is unable to care for herself. I called her son brother Elizabeth Love and her son Elizabeth Love [facility]) and they both expressed that it has been difficult to care for her at home as she often hides her medicines and eats whatever she wants. I expressed to them that her HF continues to get worse due to this noncompliance and that if we can't find some way to increase her compliance she will  likely die soon from her HF. I brought up the idea of SNF placement and  initially they were very against this but after discussing they have now agreed to consider a 2-3 week stay at a SNF at discharge to try and help stabilize her HF. We will meet tomorrow night at 5pm to discuss further.  For now, agree with placing PICC and diurese with IV lasix 80 tid. May need milrinone. Will continue low-dose ACE-I and b-blocker as tolerated. Can hold as needed.   HF team will follow. D/w Dr. Sunnie Nielsen.  Gottfried Standish,MD 7:07 PM

## 2013-01-06 NOTE — ED Notes (Signed)
Pt in radiology 

## 2013-01-06 NOTE — Progress Notes (Signed)
PT Cancellation Note  Patient Details Name: Elizabeth Love MRN: [id number] DOB: May 15, 1956   Cancelled Treatment:    Reason Eval/Treat Not Completed: Patient not medically ready. Per RN, hold PT today due to pt in acute heart failure and awaiting IV pain meds, however pt with no IV access. PT to re-attempt as able.   Marcene Brawn 01/06/2013, 2:27 PM  Lewis Shock, PT, DPT Pager #: [phone] Office #: [phone]

## 2013-01-06 NOTE — ED Notes (Signed)
Left message for brother-Travis that patient admitted to [facility]

## 2013-01-06 NOTE — Progress Notes (Signed)
Utilization Review Completed Strother Everitt J. Janaysha Depaulo, RN, BSN, NCM [phone]  

## 2013-01-06 NOTE — ED Notes (Signed)
Patient back from  X-ray 

## 2013-01-06 NOTE — Progress Notes (Signed)
[facility] PROGRESS NOTE  Elizabeth Love ZOX:[id number] DOB: Jan 21, 1956 DOA: 01/05/2013 PCP: [name], [name], MD  Assessment/Plan: 1. Acute on Chronic Systolic Heart Failure: Continue with IV lasix 40 mg IV Q 6 hours. Will add Kcl 40 mg BID. Strict I and O. Continue with lisinopril. I don't see recent ECHO since 04-2012. Will ordered ECHO. Will ask Heart Failure team for evaluation, assistance. Patient will need very close follow up to avoid frequent hospitalization.  2. Abdominal Pain: Will check KUB. Lipase.  3. A.Fib/ Protein C deficiency - continue rate control and Xarelto. Continue with amiodarone. 4. H/o Seizures - continue home keppra 5. H/O stroke with resultant Aphasia and right side weakness. Continue with Xarelto. 6. History of DVT 06/2012: RLE DVT, + protein C deficiency, placed on Xarelto. s/p IVC filter  7. History of Headaches, chronic. No worsening neuro deficit. Monitor.     Code Status: Full Family Communication: None at bedside.  Disposition Plan: To be determine.    Consultants:  Cardiology.   Procedures:  None.  Antibiotics:  None.  HPI/Subjective: Complaining of headaches, appears to be chronic.Marland Kitchen  She is also complaining of abdominal pain, no BM in 2 days.    Objective: Filed Vitals:   01/06/13 0220 01/06/13 0227 01/06/13 0231 01/06/13 0500  BP: 119/79 115/81 123/79 111/89  Pulse: 71 71 72 65  Temp: 97.4 F (36.3 C)   97.4 F (36.3 C)  TempSrc: Oral   Oral  Resp: 23 21 14 18   Height:    5\' 2"  (1.575 m)  Weight:    52.2 kg (115 lb 1.3 oz)  SpO2: 100% 100% 100% 97%    Intake/Output Summary (Last 24 hours) at 01/06/13 0905 Last data filed at 01/06/13 0839  Gross per 24 hour  Intake     50 ml  Output      0 ml  Net     50 ml   Filed Weights   01/06/13 0500  Weight: 52.2 kg (115 lb 1.3 oz)    Exam:   General: No distress, aphasic.  Cardiovascular: Distant Heart Sounds.   Respiratory: Bilateral crackles.    Abdomen: BS present, soft, Tender to palpation.   Musculoskeletal: LE bilateral edema.   Neuro : Aphasic, right side weakness.   Data Reviewed: Basic Metabolic Panel:  Recent Labs Lab 01/06/13 0154 01/06/13 0730  NA 139  --   K 3.9  --   CL 103  --   CO2 24  --   GLUCOSE 108*  --   BUN 21  --   CREATININE 1.10  --   CALCIUM 9.7  --   MG  --  1.8   Liver Function Tests: No results found for this basename: AST, ALT, ALKPHOS, BILITOT, PROT, ALBUMIN,  in the last 168 hours No results found for this basename: LIPASE, AMYLASE,  in the last 168 hours No results found for this basename: AMMONIA,  in the last 168 hours CBC:  Recent Labs Lab 01/06/13 0154  WBC 6.6  NEUTROABS 4.3  HGB 10.1*  HCT 31.8*  MCV 79.7  PLT 278   Cardiac Enzymes:  Recent Labs Lab 01/06/13 0154 01/06/13 0730  TROPONINI <0.30 <0.30   BNP (last 3 results)  Recent Labs  11/06/12 0825 11/27/12 1821 01/06/13 0154  PROBNP 12374.0* 9596.0* 10506.0*   CBG: No results found for this basename: GLUCAP,  in the last 168 hours  No results found for this or any previous visit (from the  past 240 hour(s)).   Studies: Dg Chest 2 View  01/06/2013  *RADIOLOGY REPORT*  Clinical Data: Chest pain and leg swelling.  Hypertension. Diabetes.  Asthma.  CHEST - 2 VIEW  Comparison: 11/28/2012  Findings: Pacer / AICD device, unchanged in position.  Midline trachea.  Marked enlargement of  cardiopericardial silhouette.  No right-sided pleural effusion.  Probable small left pleural effusion.  No pneumothorax.  Increase in moderate interstitial edema. Suboptimal evaluation of the inferior left hemithorax on the frontal view.  IMPRESSION: Increasing moderate congestive heart failure.  Probable small left pleural effusion.  Marked enlargement of the cardiopericardial silhouette. Cardiomegaly.  Cannot exclude pericardial effusion.   Original Report Authenticated By: Jeronimo Greaves, M.D.     Scheduled Meds: . amiodarone   400 mg Oral Daily  . aspirin EC  81 mg Oral Daily  . escitalopram  10 mg Oral Daily  . feeding supplement  237 mL Oral BID BM  . furosemide  40 mg Intravenous Q6H  . levETIRAcetam  1,000 mg Oral BID  . levothyroxine  50 mcg Oral QAC breakfast  . lisinopril  5 mg Oral Daily  . metoprolol tartrate  25 mg Oral BID  . pantoprazole  40 mg Oral Daily  . QUEtiapine  50 mg Oral Daily  . Rivaroxaban  20 mg Oral Q supper  . sodium chloride  3 mL Intravenous Q12H   Continuous Infusions:   Principal Problem:   Acute on chronic systolic CHF (congestive heart failure) Active Problems:   Aphasia as late effect of cerebrovascular accident   Atrial fibrillation   Protein C deficiency   DM type 2 (diabetes mellitus, type 2)    Time spent: 35 minutes.     Ralston Venus  [facility] Pager [phone]. If 7PM-7AM, please contact night-coverage at [email or web address], password [email or web address] 01/06/2013, 9:05 AM  LOS: 1 day

## 2013-01-06 NOTE — ED Notes (Signed)
MD at bedside. 

## 2013-01-06 NOTE — H&P (Signed)
[facility] History and Physical  Elizabeth Love ION:[id number] DOB: 09-Dec-1955 DOA: 01/05/2013  Referring physician: ED PCP: [name], [name], MD  Specialists: None  Chief Complaint: BLE edema, CHF  HPI: Elizabeth Love is a 57 y.o. female who is known to our service, patient has a very complex medical history she has history of nonischemic cardiomyopathy with EF of 10% status post AICD placement in 2007 in [place].  She also has history of noncompliance and behavioral problems thought to be secondary to history of stroke with residual right-sided weakness and aphasia which makes history taking difficult at best.  She also has a history of atrial fibrillation for thought to be initially not a good candidate for anticoagulation due to noncompliance.  But in November 2003 she developed DVT with subsequent pulmonary embolism and was found to have protein C deficiency and so has had an IVC filter placement done and was started on Xarelto which is still taking.  In the ED her troponin was negative, EKG, unchanged, CXR demonstrated pulmonary edema, cardiomegaly, and have 3+ pitting edema to her legs.  She was started on lasix and sent here for admission.  Review of Systems: Difficult to complete due to patients expressive aphasia.  Past Medical History  Diagnosis Date  . Seizures   . Hypertension   . Stroke     2007 - R sided weakness and expressive aphasia with h/o behavioral problems related to this  . Asthma   . Diabetes mellitus   . 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 Chronic systolic CHF (congestive heart failure)     a. s/p AICD ~2007 ([employer]. [employer]) - previous care [place]. No known hx CAD. b. EF 10% by echo 04/2012.  Marland Kitchen Atrial fibrillation     04/2012 - not felt to be an anticoag candidate long term at that time, but later had DVT so was on placed on Xarelto  . Coagulopathy     "Protein C deficiency"  . History of DVT (deep vein thrombosis)     a. 06/2012: RLE  DVT, + protein C deficiency, placed on Xarelto. s/p IVC filter  . Noncompliance     Due to mental status, family has had to coax her to take medicines  . Hypothyroidism   . VT (ventricular tachycardia)     a. h/o ICD ~2007-2008. b. Adm 08/2012 with UTI, had VT/ICD shock x 5 (hypokalemic)  . Protein C deficiency    Past Surgical History  Procedure Laterality Date  . Cholecystectomy    . Appendectomy    . Pacemaker insertion    . Vena cava filter placement  07/19/2012    Procedure: INSERTION VENA-CAVA FILTER;  Surgeon: Larina Earthly, MD;  Location: [facility] OR;  Service: Vascular;  Laterality: [name];  . Insert / replace / remove pacemaker      ICD  . Abdominal hysterectomy     Social History:  reports that she has quit smoking. She does not have any smokeless tobacco history on file. She reports that she does not drink alcohol or use illicit drugs.   Allergies  Allergen Reactions  . Penicillins Rash  . Sulfa Antibiotics Rash    Family History  Problem Relation Age of Onset  . Coronary artery disease Neg Hx   . Hypertension Mother     Prior to Admission medications   Medication Sig Start Date End Date Taking? Authorizing Provider  acetaminophen (TYLENOL) 325 MG tablet Take 2  tablets (650 mg total) by mouth every 6 (six) hours as needed. 12/03/12   Osvaldo Shipper, MD  albuterol (PROVENTIL) (2.5 MG/3ML) 0.083% nebulizer solution Take 2.5 mg by nebulization every 6 (six) hours as needed. For cough or wheeze    Historical Provider, MD  ALPRAZolam (XANAX) 0.5 MG tablet Take 1 tablet (0.5 mg total) by mouth at bedtime as needed for sleep. For sleep 12/03/12   Osvaldo Shipper, MD  amiodarone (PACERONE) 400 MG tablet Take 1 tablet (400 mg total) by mouth daily. 11/08/12   Kathlen Mody, MD  aspirin 81 MG EC tablet Take 1 tablet (81 mg total) by mouth daily. Swallow whole. 09/19/12   Roger A Arguello, PA-C  escitalopram (LEXAPRO) 10 MG tablet Take 10 mg by mouth daily.    Historical Provider, MD   feeding supplement (ENSURE COMPLETE) LIQD Take 237 mLs by mouth 2 (two) times daily between meals. 09/19/12   Roger A Arguello, PA-C  furosemide (LASIX) 20 MG tablet Take 1 tablet (20 mg total) by mouth as needed (for weight gain of 2 lbs from baseline). 12/03/12   Osvaldo Shipper, MD  levETIRAcetam (KEPPRA) 500 MG tablet Take 1,000 mg by mouth 2 (two) times daily.    Historical Provider, MD  levothyroxine (SYNTHROID, LEVOTHROID) 50 MCG tablet Take 50 mcg by mouth daily.    Historical Provider, MD  lisinopril (PRINIVIL,ZESTRIL) 5 MG tablet Take 5 mg by mouth daily.    Historical Provider, MD  metoprolol tartrate (LOPRESSOR) 25 MG tablet Take 25 mg by mouth 2 (two) times daily.    Historical Provider, MD  omeprazole (PRILOSEC) 20 MG capsule Take 20 mg by mouth 2 (two) times daily.    Historical Provider, MD  oxyCODONE-acetaminophen (PERCOCET) 10-325 MG per tablet Take 1 tablet by mouth every 6 (six) hours as needed for pain. Scheduled per pharmacy 12/03/12   Osvaldo Shipper, MD  QUEtiapine (SEROQUEL XR) 50 MG TB24 Take 50 mg by mouth daily.    Historical Provider, MD  Rivaroxaban (XARELTO) 20 MG TABS Take 1 tablet (20 mg total) by mouth daily with supper. 08/29/12   Penny Pia, MD  spironolactone (ALDACTONE) 25 MG tablet Take 1 tablet (25 mg total) by mouth daily. 08/29/12   Penny Pia, MD   Physical Exam: Filed Vitals:   01/06/13 1610 01/06/13 0220 01/06/13 0227 01/06/13 0231  BP:  119/79 115/81 123/79  Pulse: 66 71 71 72  Temp:  97.4 F (36.3 C)    TempSrc:  Oral    Resp: 22 23 21 14   SpO2: 100% 100% 100% 100%    General:  NAD, patient sitting up on bedside Eyes: PEERLA EOMI ENT: mucous membranes moist Neck: supple w/o JVD Cardiovascular: RRR 3/6 SEM Respiratory: decreased breathsounds Abdomen: soft, nt, nd, bs+ Skin: 3+ symmetrical B pitting edema to mid thigh Musculoskeletal: MAE, full ROM all 4 extremities Psychiatric: frustrated with her expressive aphasia, tearful  affect Neurologic: alert, seems oriented to what is going on around her though she is clearly frustrated at her aphasia  Labs on Admission:  Basic Metabolic Panel:  Recent Labs Lab 01/06/13 0154  NA 139  K 3.9  CL 103  CO2 24  GLUCOSE 108*  BUN 21  CREATININE 1.10  CALCIUM 9.7   Liver Function Tests: No results found for this basename: AST, ALT, ALKPHOS, BILITOT, PROT, ALBUMIN,  in the last 168 hours No results found for this basename: LIPASE, AMYLASE,  in the last 168 hours No results found for this basename:  AMMONIA,  in the last 168 hours CBC:  Recent Labs Lab 01/06/13 0154  WBC 6.6  NEUTROABS 4.3  HGB 10.1*  HCT 31.8*  MCV 79.7  PLT 278   Cardiac Enzymes:  Recent Labs Lab 01/06/13 0154  TROPONINI <0.30    BNP (last 3 results)  Recent Labs  11/06/12 0825 11/27/12 1821 01/06/13 0154  PROBNP 12374.0* 9596.0* 10506.0*   CBG: No results found for this basename: GLUCAP,  in the last 168 hours  Radiological Exams on Admission: Dg Chest 2 View  01/06/2013  *RADIOLOGY REPORT*  Clinical Data: Chest pain and leg swelling.  Hypertension. Diabetes.  Asthma.  CHEST - 2 VIEW  Comparison: 11/28/2012  Findings: Pacer / AICD device, unchanged in position.  Midline trachea.  Marked enlargement of  cardiopericardial silhouette.  No right-sided pleural effusion.  Probable small left pleural effusion.  No pneumothorax.  Increase in moderate interstitial edema. Suboptimal evaluation of the inferior left hemithorax on the frontal view.  IMPRESSION: Increasing moderate congestive heart failure.  Probable small left pleural effusion.  Marked enlargement of the cardiopericardial silhouette. Cardiomegaly.  Cannot exclude pericardial effusion.   Original Report Authenticated By: Jeronimo Greaves, M.D.     EKG: Independently reviewed.  Assessment/Plan Principal Problem:   Acute on chronic systolic CHF (congestive heart failure) Active Problems:   Aphasia as late effect of  cerebrovascular accident   Atrial fibrillation   Protein C deficiency   DM type 2 (diabetes mellitus, type 2)   1. Acute on chronic systolic CHF - ordering lasix 40mg  IV q 6 h for now, admitted to observation, EF 10%, ordering serial troponins, first is negative but these have elevated slightly due to her CHF in the past.  On CHF pathway but havent ordered a repeat 2D echo since one was just done in Jan. 2. A.Fib Protein C deficiency - continue rate control and Xarelto 3. DM 2 - dont see that the patient is on any specific treatment for this at home, ordering CBG checks AC/HS 4. H/o Seizures - continue home keppra    Code Status: Full Code (must indicate code status--if unknown or must be presumed, indicate so) Family Communication: No family in room (indicate person spoken with, if applicable, with phone number if by telephone) Disposition Plan: Admit to obs (indicate anticipated LOS)  Time spent: 70 min  Elizabeth Love M. [facility] Pager [phone]  If 7PM-7AM, please contact night-coverage [email or web address] Password [facility] 01/06/2013, 5:36 AM

## 2013-01-06 NOTE — Progress Notes (Signed)
Patient's IV is occluded, I unsuccessful attempt to restart, IV team paged; will continue to monitor patient. Lorretta Harp RN

## 2013-01-06 NOTE — ED Notes (Signed)
MD at bedside, continuing to attempt IV placement

## 2013-01-06 NOTE — Progress Notes (Signed)
MEDICATION RELATED NOTE   Pharmacy Re:  Rivaroxaban (Xarelto) Indication:  Afib  Assessment: This is a 57 yo female with multiple co-morbidities that is receiving rivaroxaban (Xarelto) for A-Fib.  Currently, her H/H reveals some mild anemia but no overt bleeding noted.  Unfortunately, she is currently refusing her medications which places her at increased risk for thrombotic event.  Recommendation:  Dose adjustments are required for decreasing renal function.  Since this may be due to volume overload, would recheck her creatinine in a day or two and change her dose to 15 mg/day if still elevated.  Allergies  Allergen Reactions  . Penicillins Rash  . Sulfa Antibiotics Rash   Patient Measurements: Height: 5\' 2"  (157.5 cm) Weight: 115 lb 1.3 oz (52.2 kg) (Per 11/30/12 documentation) IBW/kg (Calculated) : 50.1  Vital Signs: Temp: 97.8 F (36.6 C) (04/21 1724) Temp src: Oral (04/21 1724) BP: 104/72 mmHg (04/21 1724) Pulse Rate: 64 (04/21 1724)  Labs:  Recent Labs  01/06/13 0154 01/06/13 0730 01/06/13 1547  WBC 6.6  --   --   HGB 10.1*  --   --   HCT 31.8*  --   --   PLT 278  --   --   CREATININE 1.10  --  1.22*  MG  --  1.8  --    Estimated Creatinine Clearance: 41.7 ml/min (by C-G formula based on Cr of 1.22).   Medications:  Scheduled:  . amiodarone  400 mg Oral Daily  . aspirin EC  81 mg Oral Daily  . docusate sodium  100 mg Oral BID  . escitalopram  10 mg Oral Daily  . feeding supplement  237 mL Oral BID BM  . [COMPLETED] furosemide  40 mg Intravenous Once  . furosemide  60 mg Intravenous Q8H  . [COMPLETED] ketorolac  30 mg Intravenous Once  . [COMPLETED] ketorolac  30 mg Intramuscular Once  . levETIRAcetam  1,000 mg Intravenous Q12H  . levothyroxine  50 mcg Oral QAC breakfast  . lisinopril  5 mg Oral Daily  . magnesium sulfate 1 - 4 g bolus IVPB  2 g Intravenous Once  . metoprolol tartrate  25 mg Oral BID  . pantoprazole  40 mg Oral Daily  . polyethylene  glycol  17 g Oral BID  . potassium chloride  40 mEq Oral BID  . QUEtiapine  50 mg Oral Daily  . Rivaroxaban  20 mg Oral Q supper  . sodium chloride  3 mL Intravenous Q12H  . [DISCONTINUED] aspirin  81 mg Oral Daily  . [DISCONTINUED] furosemide  40 mg Intravenous Q6H  . [DISCONTINUED] levETIRAcetam  1,000 mg Oral BID   Nadara Mustard, PharmD., MS Clinical Pharmacist Pager:  [phone] Thank you for allowing pharmacy to be part of this patients care team. 01/06/2013,8:25 PM

## 2013-01-06 NOTE — Progress Notes (Signed)
Monitor tech reported abnormal heart strip, MD notified, orders given will continue to monitor patient. Patient is refusing lab draws and PO medications, will attempt to readminister medications and have laboratory to attempt to draw labs.  Lorretta Harp RN

## 2013-01-06 NOTE — Plan of Care (Signed)
57 yo F known to our service, CHF exacerbation, has systolic CHF with EF of 10%.  Admit to obs.

## 2013-01-06 NOTE — Progress Notes (Signed)
Patient refusing lab draws  - MD notified 

## 2013-01-06 NOTE — Progress Notes (Addendum)
PCCM was consulted in regards to non emergent central line placement.   S: The patient is in HF, has refused most of her PO medications, IV team was unable to place PICC line and PCCM was called for possible placement of CL.    The patient has one PIV on her right hand   She is on Xarelto for A.fib and protein C deficiency    The patient has been agitated today  O: RR 24, O2 sats 99%, BP 117/85  Gen: pt sitting in chair by bedside, opens her eyes occasionally, not following commands, appears somnlent  Pulm: Does not allow me to auscultate her chest, pushes the stethoscope away  Neuro: Alert, not oriented to place, person or situation. Somnolent and mildly agitated  P: Pt is on Xarelto, last dose likely <24 hours ago. She appears mildly agitated and will likely not be able to tolerate a central line placement. Unable to obtain consent from the patient at this time.  Will not proceed with central line placement unless emergent CL placement.    Ky Barban, MD PGY1  Discussed with Dr. De Burrs

## 2013-01-06 NOTE — Progress Notes (Signed)
Patient is very agitated and anxious, she will not take any PO medications, and she is refusing to stay in bed; MD has been notified, will continue to monitor patient.  Lorretta Harp RN

## 2013-01-06 NOTE — Progress Notes (Signed)
IV team unable to place PICC line, patient is very agitated, anxious and uncooperative.  Radiology was unable to get abdominal xray patient because patient would not lie still; will continue to monitor patient.  Lorretta Harp RN

## 2013-01-06 NOTE — ED Notes (Signed)
Patients brother-Travis PHONE NUMBER [phone]

## 2013-01-06 NOTE — Progress Notes (Signed)
Patient is refusing all medications and has no IV access, two IV nurses has assessed and attempted restarts but has been unsuccessful; will continue to monitor patient.  Lorretta Harp RN

## 2013-01-06 NOTE — Progress Notes (Signed)
Patient is refusing all medication at this time; will notify physician and continue to monitor patient. Lorretta Harp RN

## 2013-01-06 NOTE — Progress Notes (Signed)
Attempted to insert PICC however pt very aggitated.   Rolling around, yelling and flinging left arm.  Was able to assess veins in right arm however they were too small for PICC insertion if she would have been cooperative enough.   Has a AICD implanted on left side so unable to use left arm.   I attempted PIV access using ultrasound without success.  Arlina Robes, RN assessed also with ultrasound.   Was able to obtain PIV in right hand without ultrasound.  Primary RN made aware of inability to insert PICC.

## 2013-01-06 NOTE — Care Management Note (Signed)
    Page 1 of 1   01/06/2013     10:50:45 AM   CARE MANAGEMENT NOTE 01/06/2013  Patient:  DESA, RECH   Account Number:  [id number]  Date Initiated:  01/06/2013  Documentation initiated by:  Oletta Cohn  Subjective/Objective Assessment:   57 yo female Chief Complaint: BLE edema, CHF     Action/Plan:   Home with brother/ home with home health   Anticipated DC Date:  01/09/2013   Anticipated DC Plan:  HOME W HOME HEALTH SERVICES      DC Planning Services  CM consult      Choice offered to / List presented to:  C-1 Patient           Status of service:   Medicare Important Message given?   (If response is "NO", the following Medicare IM given date fields will be blank) Date Medicare IM given:   Date Additional Medicare IM given:    Discharge Disposition:    Per UR Regulation:  Reviewed for med. necessity/level of care/duration of stay  If discussed at Long Length of Stay Meetings, dates discussed:    Comments:  01/06/13 @ 1030.Marland KitchenMarland KitchenOletta Cohn, RN, BSN, [place] [phone] Micah Flesher to speak with pt regarding discharge planning.  Pt very agitated and refused Home Health Services at this time.  CM will continue to follow-up and perhaps speak with brother (POA) when he returns to hospital.

## 2013-01-06 NOTE — ED Provider Notes (Signed)
History  This chart was scribed for Elizabeth Haan Smitty Cords, MD by Shari Heritage, ED Scribe. The patient was seen in room MH09/MH09. Patient's care was started at 0000.   CSN: [id number]  Arrival date & time 01/05/13  2331   First MD Initiated Contact with Patient 01/06/13 0000      Chief Complaint  Patient presents with  . Leg Swelling    The history is limited by the condition of the patient. No language interpreter was used.   Level 5 Caveat - Unable to obtain full history due to patient's AMS. HPI Comments: Elizabeth Love is a 57 y.o. female with history of CHF, CVA, hypertension, stroke, atrial fibrillation, DVT, lower extremity edema and diabetes who presents to the Emergency Department complaining of severe, persistent bilateral lower extremity swelling for the past two weeks.   Past Medical History  Diagnosis Date  . Seizures   . Hypertension   . Stroke     2007 - R sided weakness and expressive aphasia with h/o behavioral problems related to this  . Asthma   . Diabetes mellitus   . 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 Chronic systolic CHF (congestive heart failure)     a. s/p AICD ~2007 ([employer]. [employer]) - previous care [place]. No known hx CAD. b. EF 10% by echo 04/2012.  Marland Kitchen Atrial fibrillation     04/2012 - not felt to be an anticoag candidate long term at that time, but later had DVT so was on placed on Xarelto  . Coagulopathy     "Protein C deficiency"  . History of DVT (deep vein thrombosis)     a. 06/2012: RLE DVT, + protein C deficiency, placed on Xarelto. s/p IVC filter  . Noncompliance     Due to mental status, family has had to coax her to take medicines  . Hypothyroidism   . VT (ventricular tachycardia)     a. h/o ICD ~2007-2008. b. Adm 08/2012 with UTI, had VT/ICD shock x 5 (hypokalemic)  . Protein C deficiency     Past Surgical History  Procedure Laterality Date  . Cholecystectomy    . Appendectomy    . Pacemaker insertion    . Vena cava filter  placement  07/19/2012    Procedure: INSERTION VENA-CAVA FILTER;  Surgeon: Larina Earthly, MD;  Location: [facility] OR;  Service: Vascular;  Laterality: [name];  . Insert / replace / remove pacemaker      ICD  . Abdominal hysterectomy      Family History  Problem Relation Age of Onset  . Coronary artery disease Neg Hx   . Hypertension Mother     History  Substance Use Topics  . Smoking status: Former Games developer  . Smokeless tobacco: Not on file  . Alcohol Use: No    OB History   Grav Para Term Preterm Abortions TAB SAB Ect Mult Living                  Review of Systems  Unable to perform ROS: Mental status change  Level 5 Caveat - Unable to complete ROS due to patient's AMS.  Allergies  Penicillins and Sulfa antibiotics  Home Medications   Current Outpatient Rx  Name  Route  Sig  Dispense  Refill  . acetaminophen (TYLENOL) 325 MG tablet   Oral   Take 2 tablets (650 mg total) by mouth every 6 (six) hours as needed.         Marland Kitchen  albuterol (PROVENTIL) (2.5 MG/3ML) 0.083% nebulizer solution   Nebulization   Take 2.5 mg by nebulization every 6 (six) hours as needed. For cough or wheeze         . ALPRAZolam (XANAX) 0.5 MG tablet   Oral   Take 1 tablet (0.5 mg total) by mouth at bedtime as needed for sleep. For sleep   30 tablet   0   . amiodarone (PACERONE) 400 MG tablet   Oral   Take 1 tablet (400 mg total) by mouth daily.   30 tablet   0   . aspirin 81 MG EC tablet   Oral   Take 1 tablet (81 mg total) by mouth daily. Swallow whole.   30 tablet   12   . escitalopram (LEXAPRO) 10 MG tablet   Oral   Take 10 mg by mouth daily.         . feeding supplement (ENSURE COMPLETE) LIQD   Oral   Take 237 mLs by mouth 2 (two) times daily between meals.         . furosemide (LASIX) 20 MG tablet   Oral   Take 1 tablet (20 mg total) by mouth as needed (for weight gain of 2 lbs from baseline).   30 tablet   3   . levETIRAcetam (KEPPRA) 500 MG tablet   Oral   Take 1,000 mg  by mouth 2 (two) times daily.         Marland Kitchen levothyroxine (SYNTHROID, LEVOTHROID) 50 MCG tablet   Oral   Take 50 mcg by mouth daily.         Marland Kitchen lisinopril (PRINIVIL,ZESTRIL) 5 MG tablet   Oral   Take 5 mg by mouth daily.         . metoprolol tartrate (LOPRESSOR) 25 MG tablet   Oral   Take 25 mg by mouth 2 (two) times daily.         Marland Kitchen omeprazole (PRILOSEC) 20 MG capsule   Oral   Take 20 mg by mouth 2 (two) times daily.         Marland Kitchen oxyCODONE-acetaminophen (PERCOCET) 10-325 MG per tablet   Oral   Take 1 tablet by mouth every 6 (six) hours as needed for pain. Scheduled per pharmacy   30 tablet   0   . QUEtiapine (SEROQUEL XR) 50 MG TB24   Oral   Take 50 mg by mouth daily.         . Rivaroxaban (XARELTO) 20 MG TABS   Oral   Take 1 tablet (20 mg total) by mouth daily with supper.   30 tablet   0   . spironolactone (ALDACTONE) 25 MG tablet   Oral   Take 1 tablet (25 mg total) by mouth daily.   30 tablet   0     Triage Vitals: BP 108/68  Pulse 67  Temp(Src) 98 F (36.7 C) (Oral)  SpO2 100%  Physical Exam  Constitutional: She appears well-developed and well-nourished.  HENT:  Head: Normocephalic and atraumatic.  Eyes: Pupils are equal, round, and reactive to light.  Neck: No tracheal deviation present.  Cardiovascular: Normal rate and regular rhythm.   Murmur heard.  Systolic murmur is present with a grade of 3/6  3/6 systolic murmur ejection.  Pulmonary/Chest: Effort normal. No respiratory distress. She has decreased breath sounds. She has no wheezes. She has no rales.  Abdominal: Soft. Bowel sounds are normal. She exhibits no distension and no mass. There is no tenderness.  There is no rebound and no guarding.  Musculoskeletal:  3+ bilateral, symmetric pitting edema up to the mid thigh, most pronounced in the dorsum of the feet, non-weeping. Clubbing of the fingers noted.  Lymphadenopathy:    She has no cervical adenopathy.  Neurological: She is alert.   Skin: Skin is warm and dry. No rash noted.    ED Course  Procedures (including critical care time) DIAGNOSTIC STUDIES: Oxygen Saturation is 100% on room air, normal by my interpretation.    COORDINATION OF CARE: 12:24 AM- Patient informed of current plan for treatment and evaluation and agrees with plan at this time.    Labs Reviewed  CBC WITH DIFFERENTIAL  BASIC METABOLIC PANEL  TROPONIN I  PRO B NATRIURETIC PEPTIDE  URINALYSIS, ROUTINE W REFLEX MICROSCOPIC     Dg Chest 2 View  01/06/2013  *RADIOLOGY REPORT*  Clinical Data: Chest pain and leg swelling.  Hypertension. Diabetes.  Asthma.  CHEST - 2 VIEW  Comparison: 11/28/2012  Findings: Pacer / AICD device, unchanged in position.  Midline trachea.  Marked enlargement of  cardiopericardial silhouette.  No right-sided pleural effusion.  Probable small left pleural effusion.  No pneumothorax.  Increase in moderate interstitial edema. Suboptimal evaluation of the inferior left hemithorax on the frontal view.  IMPRESSION: Increasing moderate congestive heart failure.  Probable small left pleural effusion.  Marked enlargement of the cardiopericardial silhouette. Cardiomegaly.  Cannot exclude pericardial effusion.   Original Report Authenticated By: Jeronimo Greaves, M.D.      No diagnosis found.    MDM  01/05/2013   Date: 01/06/2013  Rate: 68  Rhythm: normal sinus rhythm  QRS Axis: normal  Intervals: PR prolonged and QT prolonged  ST/T Wave abnormalities: nonspecific ST changes  Conduction Disutrbances:first-degree A-V block   Narrative Interpretation:   Old EKG Reviewed: changes noted        I personally performed the services described in this documentation, which was scribed in my presence. The recorded information has been reviewed and is accurate.     Zeno Hickel Smitty Cords, MD 01/06/13 [phone]

## 2013-01-06 NOTE — Progress Notes (Signed)
PICC Line placement unsuccessful, MD notified and has called to set up Central line placement via CCMD; patient is still refusing any oral medications and no IV medications has been given due to no IV access; will continue to monitor patient.  Lorretta Harp RN

## 2013-01-07 ENCOUNTER — Inpatient Hospital Stay (HOSPITAL_COMMUNITY): Payer: Medicare Other

## 2013-01-07 DIAGNOSIS — I69920 Aphasia following unspecified cerebrovascular disease: Secondary | ICD-10-CM

## 2013-01-07 DIAGNOSIS — I319 Disease of pericardium, unspecified: Secondary | ICD-10-CM

## 2013-01-07 LAB — BASIC METABOLIC PANEL
BUN: 24 mg/dL — ABNORMAL HIGH (ref 6–23)
Chloride: 105 mEq/L (ref 96–112)
Creatinine, Ser: 1.22 mg/dL — ABNORMAL HIGH (ref 0.50–1.10)
GFR calc Af Amer: 57 mL/min — ABNORMAL LOW (ref >90)
Glucose, Bld: 102 mg/dL — ABNORMAL HIGH (ref 70–99)

## 2013-01-07 LAB — GLUCOSE, CAPILLARY
Glucose-Capillary: 118 mg/dL — ABNORMAL HIGH (ref 70–99)
Glucose-Capillary: 132 mg/dL — ABNORMAL HIGH (ref 70–99)

## 2013-01-07 LAB — CBC
HCT: 26.8 % — ABNORMAL LOW (ref 36.0–46.0)
Hemoglobin: 8.5 g/dL — ABNORMAL LOW (ref 12.0–15.0)
MCH: 24.9 pg — ABNORMAL LOW (ref 26.0–34.0)
MCHC: 31.7 g/dL (ref 30.0–36.0)
MCV: 78.4 fL (ref 78.0–100.0)
RDW: 18.3 % — ABNORMAL HIGH (ref 11.5–15.5)

## 2013-01-07 MED ORDER — MORPHINE SULFATE 2 MG/ML IJ SOLN
1.0000 mg | INTRAMUSCULAR | Status: DC | PRN
  Administered 2013-01-07 – 2013-01-10 (×13): 1 mg via INTRAVENOUS
  Filled 2013-01-07 (×14): qty 1

## 2013-01-07 NOTE — Progress Notes (Signed)
Patient having multiple beats of vtach.  Dr. Gala Romney notified.  Will continue to monitor

## 2013-01-07 NOTE — Progress Notes (Signed)
[facility] PROGRESS NOTE  Elizabeth Love ZOX:[id number] DOB: Dec 09, 1955 DOA: 01/05/2013 PCP: [name], [name], MD  Assessment/Plan:   1. Acute on Chronic Systolic Heart Failure: Continue with IV lasix 60 mg IV Q 8 hours.  Strict I and O. Hold  Lisinopril due to hyperkalemia. Last ECHO 04-2012. ECHO pending. Appreciate Dr Gala Romney help. SW consulted for placement. If patient refuse SNF Will need Psych evaluation for Capacity.  2. Abdominal Pain: KUB negative for obstruction. Lipase normal. 3. A.Fib/ Protein C deficiency - continue rate control and Xarelto. Continue with amiodarone. 4. H/o Seizures - continue home keppra 5. H/O stroke with resultant Aphasia and right side weakness. Continue with Xarelto. 6. History of DVT 06/2012: RLE DVT, + protein C deficiency, placed on Xarelto. s/p IVC filter  7. History of Headaches, Chronic. No worsening neuro deficit.  CT head: no acute finding.  8. Hyperkalemia: Continue with lasix. Discontinue KCL Supplement.  9. Anemia: monitor Hb.     Code Status: Full Family Communication: None at bedside.  Disposition Plan: To be determine. Patient benefit from SNF   Consultants:  Cardiology.   Procedures:  None.  Antibiotics:  None.  HPI/Subjective: Still complaining of headache.  No Abdominal pain.    Objective: Filed Vitals:   01/06/13 1724 01/06/13 2028 01/07/13 0500 01/07/13 0935  BP: 104/72 117/85 98/64 114/71  Pulse: 64  66 69  Temp: 97.8 F (36.6 C) 97.9 F (36.6 C) 97.7 F (36.5 C) 97.4 F (36.3 C)  TempSrc: Oral Oral Oral Oral  Resp: 17 20 20 18   Height:      Weight:   53.6 kg (118 lb 2.7 oz)   SpO2: 100% 99% 99% 95%    Intake/Output Summary (Last 24 hours) at 01/07/13 1158 Last data filed at 01/07/13 1007  Gross per 24 hour  Intake    501 ml  Output     75 ml  Net    426 ml   Filed Weights   01/06/13 0500 01/07/13 0500  Weight: 52.2 kg (115 lb 1.3 oz) 53.6 kg (118 lb 2.7 oz)     Exam:   General: No distress, aphasic.  Cardiovascular: Distant Heart Sounds.   Respiratory: Bilateral crackles.   Abdomen: refuse exam.   Musculoskeletal: LE bilateral edema.   Neuro : Aphasic, right side weakness.   Data Reviewed: Basic Metabolic Panel:  Recent Labs Lab 01/06/13 0154 01/06/13 0730 01/06/13 1547 01/07/13 0829  NA 139  --  137 138  K 3.9  --  4.7 5.8*  CL 103  --  105 105  CO2 24  --  20 21  GLUCOSE 108*  --  104* 102*  BUN 21  --  22 24*  CREATININE 1.10  --  1.22* 1.22*  CALCIUM 9.7  --  9.1 9.1  MG  --  1.8  --   --    Liver Function Tests: No results found for this basename: AST, ALT, ALKPHOS, BILITOT, PROT, ALBUMIN,  in the last 168 hours  Recent Labs Lab 01/06/13 0935  LIPASE 13   No results found for this basename: AMMONIA,  in the last 168 hours CBC:  Recent Labs Lab 01/06/13 0154 01/07/13 0829  WBC 6.6 5.1  NEUTROABS 4.3  --   HGB 10.1* 8.5*  HCT 31.8* 26.8*  MCV 79.7 78.4  PLT 278 155   Cardiac Enzymes:  Recent Labs Lab 01/06/13 0154 01/06/13 0730 01/06/13 1547 01/06/13 2106  TROPONINI <0.30 <0.30 <0.30 <0.30  BNP (last 3 results)  Recent Labs  11/06/12 0825 11/27/12 1821 01/06/13 0154  PROBNP 12374.0* 9596.0* 10506.0*   CBG:  Recent Labs Lab 01/06/13 1132 01/06/13 1717 01/06/13 2125 01/07/13 0555 01/07/13 1125  GLUCAP 100* 97 111* 118* 133*    No results found for this or any previous visit (from the past 240 hour(s)).   Studies: Dg Chest 2 View  01/06/2013  *RADIOLOGY REPORT*  Clinical Data: Chest pain and leg swelling.  Hypertension. Diabetes.  Asthma.  CHEST - 2 VIEW  Comparison: 11/28/2012  Findings: Pacer / AICD device, unchanged in position.  Midline trachea.  Marked enlargement of  cardiopericardial silhouette.  No right-sided pleural effusion.  Probable small left pleural effusion.  No pneumothorax.  Increase in moderate interstitial edema. Suboptimal evaluation of the inferior left  hemithorax on the frontal view.  IMPRESSION: Increasing moderate congestive heart failure.  Probable small left pleural effusion.  Marked enlargement of the cardiopericardial silhouette. Cardiomegaly.  Cannot exclude pericardial effusion.   Original Report Authenticated By: Jeronimo Greaves, M.D.    Dg Abd 1 View  01/06/2013  *RADIOLOGY REPORT*  Clinical Data: Generalized abdominal pain.  ABDOMEN - 1 VIEW  Comparison: 08/25/2012 and chest film of earlier in the day.  Findings: 2 supine views.  The first demonstrates an AICD device terminating over the left ventricle.  An IVC filter terminates at the L2-L3 level.  No gaseous distention of bowel loops.  No gross free intraperitoneal air.  Marked enlargement of cardiopericardial silhouette.  The second image demonstrates gas within the stomach and rectosigmoid region.  No pneumatosis.  Phleboliths in the pelvis. Artifactual linear lucency projecting over the upper abdomen.  IMPRESSION: No acute findings.   Original Report Authenticated By: Jeronimo Greaves, M.D.    Ct Head Wo Contrast  01/07/2013  *RADIOLOGY REPORT*  Clinical Data: Headache.  History of stroke  CT HEAD WITHOUT CONTRAST  Technique:  Contiguous axial images were obtained from the base of the skull through the vertex without contrast.  Comparison: 11/30/2012  Findings: Large area of chronic infarction left MCA territory is unchanged.  No acute infarct.  Negative for hemorrhage or mass. Negative for hydrocephalus.  IMPRESSION: Chronic left MCA infarct.  No interval change from the prior study.   Original Report Authenticated By: Janeece Riggers, M.D.     Scheduled Meds: . amiodarone  400 mg Oral Daily  . aspirin EC  81 mg Oral Daily  . docusate sodium  100 mg Oral BID  . escitalopram  10 mg Oral Daily  . feeding supplement  237 mL Oral BID BM  . furosemide  60 mg Intravenous Q8H  . levETIRAcetam  1,000 mg Intravenous Q12H  . levothyroxine  50 mcg Oral QAC breakfast  . metoprolol tartrate  25 mg Oral  BID  . pantoprazole  40 mg Oral Daily  . polyethylene glycol  17 g Oral BID  . QUEtiapine  50 mg Oral Daily  . Rivaroxaban  20 mg Oral Q supper  . sodium chloride  3 mL Intravenous Q12H   Continuous Infusions:   Principal Problem:   Acute on chronic systolic CHF (congestive heart failure) Active Problems:   Aphasia as late effect of cerebrovascular accident   Atrial fibrillation   Protein C deficiency   DM type 2 (diabetes mellitus, type 2)    Time spent: 25 minutes.     Cariann Kinnamon  [facility] Pager [phone]. If 7PM-7AM, please contact night-coverage at [email or web address], password [facility] 01/07/2013, 11:58 AM  LOS:  2 days

## 2013-01-07 NOTE — Progress Notes (Signed)
  Echocardiogram 2D Echocardiogram has been performed.  Elizabeth Love A 01/07/2013, 3:04 PM

## 2013-01-07 NOTE — Progress Notes (Addendum)
Advanced Heart Failure Rounding Note   Subjective:    Elizabeth Love is 57 year old with a very complicated PMH of chronic systolic heart failure due to NICM (EF 10% 04/2012), status post AICD implantation (placed [facility] 2007), hypothyroidism, seizure disorder, HTN, DM2. 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 A fib (on Xarelto), RLE DVT, S/P IVC filter, and protein C deficiency.   Has had multiple admissions for various problems.   Most recently, admitted to [facility] 01/05/13 with bilateral lower extremity edema and volume overload. Started on 40 mg IV lasix every 6 hours. Pertinent admission labs included Pro BNP 10506, CEs negative, K 3.9, and Creatinine 1.1. CXR- Increasing congestive heart failure.   Ongoing refusal of oral medications. Last night she refused PICC.  Has tiny PIV in hand.   Complains of pain. Screaming intermittently. According to CMA, [name] was getting up and going to bathroom by herself yesterday but today lying in bed screaming.   Tele with multiple runs NSVT     Objective:   Weight Range:  Vital Signs:   Temp:  [97.3 F (36.3 C)-97.9 F (36.6 C)] 97.7 F (36.5 C) (04/22 0500) Pulse Rate:  [64-66] 66 (04/22 0500) Resp:  [17-20] 20 (04/22 0500) BP: (98-117)/(64-85) 98/64 mmHg (04/22 0500) SpO2:  [93 %-100 %] 99 % (04/22 0500) Weight:  [118 lb 2.7 oz (53.6 kg)] 118 lb 2.7 oz (53.6 kg) (04/22 0500) Last BM Date: 01/05/13  Weight change: Filed Weights   01/06/13 0500 01/07/13 0500  Weight: 115 lb 1.3 oz (52.2 kg) 118 lb 2.7 oz (53.6 kg)    Intake/Output:   Intake/Output Summary (Last 24 hours) at 01/07/13 0915 Last data filed at 01/07/13 0840  Gross per 24 hour  Intake    378 ml  Output    275 ml  Net    103 ml     Physical Exam: General: Chronically ill appearing. No respiratory difficulty Sitting in recliner. Answers some questions then falls back to sleep. Cries when touched  HEENT: normal  Neck: supple. JVD to jaw. Carotids  2+ bilat; no bruits. No lymphadenopathy or thryomegaly appreciated.  Cor: PMI nondisplaced. Irregular rate & rhythm. No rubs, loud S 3 3/6 MR.  Lungs: clear  Abdomen: soft, nontender, nondistended. No hepatosplenomegaly. No bruits or masses. Good bowel sounds.  Extremities: no cyanosis, clubbing, rash, R and LLE 3+ edema RUE and RLE hemipharesis  Neuro: Awake but falls asleep quickly. +some expressive aphasia. R sided hemiparesis.     Labs: Basic Metabolic Panel:  Recent Labs Lab 01/06/13 0154 01/06/13 0730 01/06/13 1547  NA 139  --  137  K 3.9  --  4.7  CL 103  --  105  CO2 24  --  20  GLUCOSE 108*  --  104*  BUN 21  --  22  CREATININE 1.10  --  1.22*  CALCIUM 9.7  --  9.1  MG  --  1.8  --     Liver Function Tests: No results found for this basename: AST, ALT, ALKPHOS, BILITOT, PROT, ALBUMIN,  in the last 168 hours  Recent Labs Lab 01/06/13 0935  LIPASE 13   No results found for this basename: AMMONIA,  in the last 168 hours  CBC:  Recent Labs Lab 01/06/13 0154 01/07/13 0829  WBC 6.6 5.1  NEUTROABS 4.3  --   HGB 10.1* 8.5*  HCT 31.8* 26.8*  MCV 79.7 78.4  PLT 278 155    Cardiac Enzymes:  Recent Labs Lab 01/06/13 0154 01/06/13 0730 01/06/13 1547 01/06/13 2106  TROPONINI <0.30 <0.30 <0.30 <0.30    BNP: BNP (last 3 results)  Recent Labs  11/06/12 0825 11/27/12 1821 01/06/13 0154  PROBNP 12374.0* 9596.0* 10506.0*     Other results:   Imaging: Dg Chest 2 View  01/06/2013  *RADIOLOGY REPORT*  Clinical Data: Chest pain and leg swelling.  Hypertension. Diabetes.  Asthma.  CHEST - 2 VIEW  Comparison: 11/28/2012  Findings: Pacer / AICD device, unchanged in position.  Midline trachea.  Marked enlargement of  cardiopericardial silhouette.  No right-sided pleural effusion.  Probable small left pleural effusion.  No pneumothorax.  Increase in moderate interstitial edema. Suboptimal evaluation of the inferior left hemithorax on the frontal view.   IMPRESSION: Increasing moderate congestive heart failure.  Probable small left pleural effusion.  Marked enlargement of the cardiopericardial silhouette. Cardiomegaly.  Cannot exclude pericardial effusion.   Original Report Authenticated By: Jeronimo Greaves, M.D.    Dg Abd 1 View  01/06/2013  *RADIOLOGY REPORT*  Clinical Data: Generalized abdominal pain.  ABDOMEN - 1 VIEW  Comparison: 08/25/2012 and chest film of earlier in the day.  Findings: 2 supine views.  The first demonstrates an AICD device terminating over the left ventricle.  An IVC filter terminates at the L2-L3 level.  No gaseous distention of bowel loops.  No gross free intraperitoneal air.  Marked enlargement of cardiopericardial silhouette.  The second image demonstrates gas within the stomach and rectosigmoid region.  No pneumatosis.  Phleboliths in the pelvis. Artifactual linear lucency projecting over the upper abdomen.  IMPRESSION: No acute findings.   Original Report Authenticated By: Jeronimo Greaves, M.D.    Ct Head Wo Contrast  01/07/2013  *RADIOLOGY REPORT*  Clinical Data: Headache.  History of stroke  CT HEAD WITHOUT CONTRAST  Technique:  Contiguous axial images were obtained from the base of the skull through the vertex without contrast.  Comparison: 11/30/2012  Findings: Large area of chronic infarction left MCA territory is unchanged.  No acute infarct.  Negative for hemorrhage or mass. Negative for hydrocephalus.  IMPRESSION: Chronic left MCA infarct.  No interval change from the prior study.   Original Report Authenticated By: Janeece Riggers, M.D.       Medications:     Scheduled Medications: . amiodarone  400 mg Oral Daily  . aspirin EC  81 mg Oral Daily  . docusate sodium  100 mg Oral BID  . escitalopram  10 mg Oral Daily  . feeding supplement  237 mL Oral BID BM  . furosemide  60 mg Intravenous Q8H  . levETIRAcetam  1,000 mg Intravenous Q12H  . levothyroxine  50 mcg Oral QAC breakfast  . lisinopril  5 mg Oral Daily  .  metoprolol tartrate  25 mg Oral BID  . pantoprazole  40 mg Oral Daily  . polyethylene glycol  17 g Oral BID  . potassium chloride  40 mEq Oral BID  . QUEtiapine  50 mg Oral Daily  . Rivaroxaban  20 mg Oral Q supper  . sodium chloride  3 mL Intravenous Q12H     Infusions:     PRN Medications:  sodium chloride, acetaminophen, albuterol, ALPRAZolam, ALPRAZolam, ondansetron (ZOFRAN) IV, oxyCODONE, oxyCODONE-acetaminophen, sodium chloride   Assessment:  1. A/C systolic heart failure - ECHO 16% 04/2012  2. Protein C Deficiency  3. A Fib - on Xarelto  4. Seizure disorder- on chronic Keppra  5. H/O DVT  6. H/C CVA - residual right-sided  weakness and expressive aphasia.  7. Hypothyroidism  8. Noncompliance  9. NSVT    Plan/Discussion:    Difficult situation because she is refusing oral medications,picc, and central line. Able to get PIV this am.   Volume status remains elevated. Weight up 3 pounds. Continue IV lasix.   Length of Stay: 2 CLEGG,AMY 01/07/2013, 9:15 AM Advanced Heart Failure Team Pager [phone] (M-F; 7a - 4p)  Please contact [facility] for night-coverage after hours (4p -7a ) and weekends on [email or web address]  Patient seen and examined with Tonye Becket, NP. We discussed all aspects of the encounter. I agree with the assessment and plan as stated above.   Very hard for me to know if her refusal of care and outbursts are voluntary on involuntary. In either case, her behavior is severely limiting her care. Would recommend having Psych see. Continue attempts to diurese as she is markedly volume overload and likely has low output physiology. Given that she appears to be suffering and her desire to avoid interventions would consider removing tele and turn off ICD.   I reviewed echo at bedside and EF 10-15%  We have very little to offer her. Will need SNF on d/c (se my consult note from yesterday).  Esbeidy Mclaine,MD 2:08 PM  Addendum:  I had talk with Ms.Vanegas  about the severity of her HF and told her that she will likely die from this in the near future. I also told her that if she continues to refuse her medications and refuse treatment that this will likely expedite the process and we should then suspend all therapies including deactivating her ICD. She then became very lucent and asked for "1 more day to think about it". I suspect that some of her behavioral issues are psychological in nature and she is using this to control her situation. I would appreciate having psych see her to understand this better.  Jaaron Oleson,MD 5:22 PM

## 2013-01-07 NOTE — Progress Notes (Addendum)
Patient vfib/vtach on the monitor at 1254. Patient assessed and is alert and oriented and returned to normal rhythm. Notified provider on call. Will continue to monitor patient. Provider paged at 1257.

## 2013-01-07 NOTE — Progress Notes (Signed)
PTOT Cancellation Note  Patient Details Name: Elizabeth Love MRN: [id number] DOB: June 04, 1956   Cancelled Treatment:    Reason Eval/Treat Not Completed: Patient not medically ready. Pt with no IV access and refusing all PO meds. Pt is in HF at this time and Potassium is elevated. Will re-attempt as able.   Marcene Brawn 01/07/2013, 11:07 AM  Lewis Shock, PT, DPT Pager #: [phone] Office #: [phone]

## 2013-01-08 ENCOUNTER — Inpatient Hospital Stay (HOSPITAL_COMMUNITY): Payer: Medicare Other

## 2013-01-08 DIAGNOSIS — Z538 Procedure and treatment not carried out for other reasons: Secondary | ICD-10-CM

## 2013-01-08 DIAGNOSIS — Z0189 Encounter for other specified special examinations: Secondary | ICD-10-CM

## 2013-01-08 LAB — GLUCOSE, CAPILLARY
Glucose-Capillary: 106 mg/dL — ABNORMAL HIGH (ref 70–99)
Glucose-Capillary: 100 mg/dL — ABNORMAL HIGH (ref 70–99)
Glucose-Capillary: 90 mg/dL (ref 70–99)

## 2013-01-08 LAB — BASIC METABOLIC PANEL
CO2: 22 mEq/L (ref 19–32)
Calcium: 9.5 mg/dL (ref 8.4–10.5)
Creatinine, Ser: 1.45 mg/dL — ABNORMAL HIGH (ref 0.50–1.10)
GFR calc non Af Amer: 40 mL/min — ABNORMAL LOW (ref >90)
Glucose, Bld: 112 mg/dL — ABNORMAL HIGH (ref 70–99)
Sodium: 139 mEq/L (ref 135–145)

## 2013-01-08 LAB — CBC
MCH: 24.9 pg — ABNORMAL LOW (ref 26.0–34.0)
MCV: 77.9 fL — ABNORMAL LOW (ref 78.0–100.0)
Platelets: 229 10*3/uL (ref 150–400)
RBC: 3.57 MIL/uL — ABNORMAL LOW (ref 3.87–5.11)
RDW: 18.1 % — ABNORMAL HIGH (ref 11.5–15.5)
WBC: 5.5 10*3/uL (ref 4.0–10.5)

## 2013-01-08 MED ORDER — BUTALBITAL-APAP-CAFFEINE 50-325-40 MG PO TABS
1.0000 | ORAL_TABLET | ORAL | Status: DC | PRN
  Administered 2013-01-08: 1 via ORAL
  Filled 2013-01-08 (×2): qty 1

## 2013-01-08 MED ORDER — LORAZEPAM 2 MG/ML IJ SOLN
1.0000 mg | Freq: Once | INTRAMUSCULAR | Status: AC
  Administered 2013-01-08: 1 mg via INTRAVENOUS

## 2013-01-08 MED ORDER — SORBITOL 70 % SOLN
30.0000 mL | Freq: Once | Status: DC
  Filled 2013-01-08: qty 30

## 2013-01-08 NOTE — Consult Note (Signed)
Reason for Consult: Capacity evaluation Referring Physician: Dr. Viviano Simas Elizabeth Love is an 57 y.o. female.  HPI: Patient is unable to participate in the capacity evaluation to date because of feeling the drowsy sedated nonverbal but able to nod her head in consistent. Staff has reported that she has a diffuse take the medication may saying 'no'.  MSE: Patient was laying down in her bed and being on sedation with medications on unable to participate for the evaluation.  Past Medical History  Diagnosis Date  . Seizures   . Hypertension   . Stroke     2007 - R sided weakness and expressive aphasia with h/o behavioral problems related to this  . Asthma   . Diabetes mellitus   . 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 Chronic systolic CHF (congestive heart failure)     a. s/p AICD ~2007 ([employer]) - previous care [facility]. No known hx CAD. b. EF 10% by echo 04/2012.  Marland Kitchen Atrial fibrillation     04/2012 - not felt to be an anticoag candidate long term at that time, but later had DVT so was on placed on Xarelto  . Coagulopathy     "Protein C deficiency"  . History of DVT (deep vein thrombosis)     a. 06/2012: RLE DVT, + protein C deficiency, placed on Xarelto. s/p IVC filter  . Noncompliance     Due to mental status, family has had to coax her to take medicines  . Hypothyroidism   . VT (ventricular tachycardia)     a. h/o ICD ~2007-2008. b. Adm 08/2012 with UTI, had VT/ICD shock x 5 (hypokalemic)  . Protein C deficiency     Past Surgical History  Procedure Laterality Date  . Cholecystectomy    . Appendectomy    . Pacemaker insertion    . Vena cava filter placement  07/19/2012    Procedure: INSERTION VENA-CAVA FILTER;  Surgeon: Larina Earthly, MD;  Location: [facility] OR;  Service: Vascular;  Laterality: [name];  . Insert / replace / remove pacemaker      ICD  . Abdominal hysterectomy      Family History  Problem Relation Age of Onset  . Coronary artery disease Neg Hx   . Hypertension  Mother     Social History:  reports that she has quit smoking. She does not have any smokeless tobacco history on file. She reports that she does not drink alcohol or use illicit drugs.  Allergies:  Allergies  Allergen Reactions  . Penicillins Rash  . Sulfa Antibiotics Rash    Medications: I have reviewed the patient's current medications.  Results for orders placed during the hospital encounter of 01/05/13 (from the past 48 hour(s))  BASIC METABOLIC PANEL     Status: Abnormal   Collection Time    01/06/13  3:47 PM      Result Value Range   Sodium 137  135 - 145 mEq/L   Potassium 4.7  3.5 - 5.1 mEq/L   Chloride 105  96 - 112 mEq/L   CO2 20  19 - 32 mEq/L   Glucose, Bld 104 (*) 70 - 99 mg/dL   BUN 22  6 - 23 mg/dL   Creatinine, Ser 1.61 (*) 0.50 - 1.10 mg/dL   Calcium 9.1  8.4 - 09.6 mg/dL   GFR calc non Af Amer 49 (*) >90 mL/min   GFR calc Af Amer 57 (*) >90 mL/min   Comment:  The eGFR has been calculated     using the CKD EPI equation.     This calculation has not been     validated in all clinical     situations.     eGFR's persistently     <90 mL/min signify     possible Chronic Kidney Disease.  TROPONIN I     Status: None   Collection Time    01/06/13  3:47 PM      Result Value Range   Troponin I <0.30  <0.30 ng/mL   Comment:            Due to the release kinetics of cTnI,     a negative result within the first hours     of the onset of symptoms does not rule out     myocardial infarction with certainty.     If myocardial infarction is still suspected,     repeat the test at appropriate intervals.  GLUCOSE, CAPILLARY     Status: None   Collection Time    01/06/13  5:17 PM      Result Value Range   Glucose-Capillary 97  70 - 99 mg/dL   Comment 1 Notify RN    TROPONIN I     Status: None   Collection Time    01/06/13  9:06 PM      Result Value Range   Troponin I <0.30  <0.30 ng/mL   Comment:            Due to the release kinetics of cTnI,      a negative result within the first hours     of the onset of symptoms does not rule out     myocardial infarction with certainty.     If myocardial infarction is still suspected,     repeat the test at appropriate intervals.  APTT     Status: Abnormal   Collection Time    01/06/13  9:06 PM      Result Value Range   aPTT 48 (*) 24 - 37 seconds   Comment:            IF BASELINE aPTT IS ELEVATED,     SUGGEST PATIENT RISK ASSESSMENT     BE USED TO DETERMINE APPROPRIATE     ANTICOAGULANT THERAPY.  GLUCOSE, CAPILLARY     Status: Abnormal   Collection Time    01/06/13  9:25 PM      Result Value Range   Glucose-Capillary 111 (*) 70 - 99 mg/dL   Comment 1 Documented in Chart     Comment 2 Notify RN    GLUCOSE, CAPILLARY     Status: Abnormal   Collection Time    01/07/13  5:55 AM      Result Value Range   Glucose-Capillary 118 (*) 70 - 99 mg/dL   Comment 1 Notify RN    BASIC METABOLIC PANEL     Status: Abnormal   Collection Time    01/07/13  8:29 AM      Result Value Range   Sodium 138  135 - 145 mEq/L   Potassium 5.8 (*) 3.5 - 5.1 mEq/L   Comment: HEMOLYSIS AT THIS LEVEL MAY AFFECT RESULT   Chloride 105  96 - 112 mEq/L   CO2 21  19 - 32 mEq/L   Glucose, Bld 102 (*) 70 - 99 mg/dL   BUN 24 (*) 6 - 23 mg/dL   Creatinine, Ser 1.61 (*) 0.50 -  1.10 mg/dL   Calcium 9.1  8.4 - 16.1 mg/dL   GFR calc non Af Amer 49 (*) >90 mL/min   GFR calc Af Amer 57 (*) >90 mL/min   Comment:            The eGFR has been calculated     using the CKD EPI equation.     This calculation has not been     validated in all clinical     situations.     eGFR's persistently     <90 mL/min signify     possible Chronic Kidney Disease.  CBC     Status: Abnormal   Collection Time    01/07/13  8:29 AM      Result Value Range   WBC 5.1  4.0 - 10.5 K/uL   RBC 3.42 (*) 3.87 - 5.11 MIL/uL   Hemoglobin 8.5 (*) 12.0 - 15.0 g/dL   HCT 09.6 (*) 04.5 - 40.9 %   MCV 78.4  78.0 - 100.0 fL   MCH 24.9 (*) 26.0 - 34.0  pg   MCHC 31.7  30.0 - 36.0 g/dL   RDW 81.1 (*) 91.4 - 78.2 %   Platelets 155  150 - 400 K/uL  GLUCOSE, CAPILLARY     Status: Abnormal   Collection Time    01/07/13 11:25 AM      Result Value Range   Glucose-Capillary 133 (*) 70 - 99 mg/dL   Comment 1 Notify RN    GLUCOSE, CAPILLARY     Status: Abnormal   Collection Time    01/07/13  4:31 PM      Result Value Range   Glucose-Capillary 132 (*) 70 - 99 mg/dL   Comment 1 Notify RN    GLUCOSE, CAPILLARY     Status: Abnormal   Collection Time    01/07/13  9:02 PM      Result Value Range   Glucose-Capillary 153 (*) 70 - 99 mg/dL   Comment 1 Notify RN    GLUCOSE, CAPILLARY     Status: Abnormal   Collection Time    01/08/13  6:15 AM      Result Value Range   Glucose-Capillary 147 (*) 70 - 99 mg/dL   Comment 1 Documented in Chart     Comment 2 Notify RN    BASIC METABOLIC PANEL     Status: Abnormal   Collection Time    01/08/13  8:17 AM      Result Value Range   Sodium 139  135 - 145 mEq/L   Potassium 4.1  3.5 - 5.1 mEq/L   Chloride 104  96 - 112 mEq/L   CO2 22  19 - 32 mEq/L   Glucose, Bld 112 (*) 70 - 99 mg/dL   BUN 26 (*) 6 - 23 mg/dL   Creatinine, Ser 9.56 (*) 0.50 - 1.10 mg/dL   Calcium 9.5  8.4 - 21.3 mg/dL   GFR calc non Af Amer 40 (*) >90 mL/min   GFR calc Af Amer 46 (*) >90 mL/min   Comment:            The eGFR has been calculated     using the CKD EPI equation.     This calculation has not been     validated in all clinical     situations.     eGFR's persistently     <90 mL/min signify     possible Chronic Kidney Disease.  CBC  Status: Abnormal   Collection Time    01/08/13  8:17 AM      Result Value Range   WBC 5.5  4.0 - 10.5 K/uL   RBC 3.57 (*) 3.87 - 5.11 MIL/uL   Hemoglobin 8.9 (*) 12.0 - 15.0 g/dL   HCT 14.7 (*) 82.9 - 56.2 %   MCV 77.9 (*) 78.0 - 100.0 fL   MCH 24.9 (*) 26.0 - 34.0 pg   MCHC 32.0  30.0 - 36.0 g/dL   RDW 13.0 (*) 86.5 - 78.4 %   Platelets 229  150 - 400 K/uL    Dg Abd 1  View  01/06/2013  *RADIOLOGY REPORT*  Clinical Data: Generalized abdominal pain.  ABDOMEN - 1 VIEW  Comparison: 08/25/2012 and chest film of earlier in the day.  Findings: 2 supine views.  The first demonstrates an AICD device terminating over the left ventricle.  An IVC filter terminates at the L2-L3 level.  No gaseous distention of bowel loops.  No gross free intraperitoneal air.  Marked enlargement of cardiopericardial silhouette.  The second image demonstrates gas within the stomach and rectosigmoid region.  No pneumatosis.  Phleboliths in the pelvis. Artifactual linear lucency projecting over the upper abdomen.  IMPRESSION: No acute findings.   Original Report Authenticated By: Jeronimo Greaves, M.D.    Ct Head Wo Contrast  01/07/2013  *RADIOLOGY REPORT*  Clinical Data: Headache.  History of stroke  CT HEAD WITHOUT CONTRAST  Technique:  Contiguous axial images were obtained from the base of the skull through the vertex without contrast.  Comparison: 11/30/2012  Findings: Large area of chronic infarction left MCA territory is unchanged.  No acute infarct.  Negative for hemorrhage or mass. Negative for hydrocephalus.  IMPRESSION: Chronic left MCA infarct.  No interval change from the prior study.   Original Report Authenticated By: Janeece Riggers, M.D.     Positive for Multiple medical problems Blood pressure 114/78, pulse 69, temperature 97.3 F (36.3 C), temperature source Oral, resp. rate 18, height 5\' 2"  (1.575 m), weight 117 lb 11.6 oz (53.4 kg), SpO2 93.00%.   Assessment/Plan: Patient will follow up tomorrow for capacity evaluation as patient was not able to participate in Capacity evaluation today.  Alanmichael Barmore,JANARDHAHA R. 01/08/2013, 3:17 PM

## 2013-01-08 NOTE — Progress Notes (Signed)
OT Cancellation Note  Patient Details Name: Elizabeth Love MRN: [id number] DOB: January 03, 1956   Cancelled Treatment:    Reason Eval/Treat Not Completed: Patient at procedure or test/ unavailable--radiology. Will attempt again tomorrow.  Evette Georges [phone] 01/08/2013, 2:24 PM

## 2013-01-08 NOTE — Progress Notes (Signed)
PT Cancellation Note  Patient Details Name: NEVIN KOZUCH MRN: [id number] DOB: [birth year]   Cancelled Treatment:    Reason Eval/Treat Not Completed: Other (comment). Pt sleeping upon arrival x 2 attempts. Pt did not awaken to name. Pt with minimal eye opening to touch on arm but unable to maintain eye opening. RN notified and reports they have been transferring her to/from the [facility] without problems. RN also reports pt to sleep a lot. Will re-attempt tomorrow and if patient un-cooperative will most likely sign off.   Marcene Brawn 01/08/2013, 11:15 AM  Lewis Shock, PT, DPT Pager #: [phone] Office #: [phone]

## 2013-01-08 NOTE — Procedures (Signed)
Interventional Radiology Procedure Note  Procedure: Unsuccessful attempt at right arm PICC 2/2 extreme patient agitation despite 1 mg Ativan IV.  Unsafe to continue until pt can be further sedated.  Complications: Small arm hematoma Recommendations: - Pt suddenly and randomly flexes arms vigorously causing the access needle to move several centimeters without control.  She is currently high risk for brachial artery or nerve injury. - Consider Haldol or other sedative prior to re-attempting PICC placement.    Signed,  Sterling Big, MD Vascular & Interventional Radiologist [facility]

## 2013-01-08 NOTE — Progress Notes (Signed)
Late entry: Palliative Medicine Team consult received; attempted to talk with patient about meeting patient initially was crying did calm down and when asked if it was ok to call her son patient stated 'no' when asked if I could call her brother patient stated'no' when asked if the team could talk just with her, patient stated 'no' when writer stated I'll come back and talk with you later, patient stated "no,no,no"- as all answers were 'no'unclear if this is patient not comprehending or that she did not want to engage - discussed with Tonye Becket NP who stated the primary team was still trying to talk with family and they will also be requesting psych consult to assess capacity- Palliative Medicine Team will follow up and re-engage once primary team has had a chance to speak with family. Will follow-up   Valente David, RN 01/08/2013, 5:10 PM Palliative Medicine Team RN Liaison [phone]

## 2013-01-08 NOTE — Progress Notes (Signed)
Clinical Social Work Department BRIEF PSYCHOSOCIAL ASSESSMENT 01/08/2013  Patient:  Elizabeth Love, Elizabeth Love     Account Number:  [id number]     Admit date:  01/05/2013  Clinical Social Worker:  Leron Croak, CLINICAL SOCIAL WORKER  Date/Time:  01/08/2013 03:17 PM  Referred by:  Physician  Date Referred:  01/08/2013 Referred for  SNF Placement   Other Referral:   Interview type:  Family Other interview type:   CSW spoke to the nurse.    PSYCHOSOCIAL DATA Living Status:  FAMILY Admitted from facility:   Level of care:   Primary support name:  Gelene Mink Primary support relationship to patient:  CHILD, ADULT Degree of support available:   Pt has minimal support    CURRENT CONCERNS Current Concerns  Post-Acute Placement   Other Concerns:    SOCIAL WORK ASSESSMENT / PLAN CSW introduced self to Pt, however Pt was unable to talk at that time. CSW then called Pt's son on the phone to discuss possible SNF placement for Pt in hopes Pt would  become more stable prior to d/c'ing to a home setting. Pt's son was hesitant about SNF placement but is accepting to placement at this time. Son voiced understanding for the need of SNF and would like the placement to be short term. CSW reinforced that placement is merely to strengthen Pt to in turn decrease hospital readmissions and promote  better health for the Pt.   Assessment/plan status:  Information/Referral to [facility] Other assessment/ plan:   Information/referral to community resources:   CSW verbally received permission to fax  information to local facilities for SNF placement.    PATIENT'S/FAMILY'S RESPONSE TO PLAN OF CARE: Son was appreciative for explanation of placement options and would like to be updated on the progress of placement.      Leron Croak, LCSWA [facility].  [phone]

## 2013-01-08 NOTE — Progress Notes (Signed)
[facility] PROGRESS NOTE  Elizabeth Love NWG:[id number] DOB: 09-Jul-1956 DOA: 01/05/2013 PCP: Elizabeth Love  Assessment/Plan:   1. Acute on Chronic Systolic Heart Failure: Continue with IV lasix 60 mg IV Q 8 hours.  Strict I and O. Hold  Lisinopril due to hyperkalemia. Last ECHO 04-2012. ECHO pending. Appreciate Heart Failure team.  SW consulted for placement. Patient in agreement for SNF placement 4/23. Psych evaluation for Capacity requested as well.  2. Abdominal Pain from massive constipation : KUB negative for obstruction. Lipase normal. Refusing laxatives.  3. A.Fib/ Protein C deficiency - continue rate control and Xarelto. Continue with amiodarone. 4. H/o Seizures - continue home keppra 5. H/O stroke with resultant Aphasia and right side weakness. Continue with Xarelto. 6. History of DVT 06/2012: RLE DVT, + protein C deficiency, placed on Xarelto. s/p IVC filter  7. History of Headaches, Chronic. No worsening neuro deficit.  CT head: no acute finding. fioricet prn  8. Hyperkalemia: Continue with lasix. Discontinued KCL Supplement.  9. Anemia: monitor Hb.     Code Status: Full Family Communication: None at bedside.  Disposition Plan: To be determine. Patient benefit from SNF   Consultants:  Cardiology.   Procedures:  None.  Antibiotics:  None.  HPI/Subjective: Still complaining of headache.  No Abdominal pain.    Objective: Filed Vitals:   01/07/13 1525 01/07/13 2048 01/08/13 0559 01/08/13 0846  BP: 102/59 109/77 103/63 114/78  Pulse: 85 88 64 69  Temp: 97.6 F (36.4 C) 97.9 F (36.6 C) 97.3 F (36.3 C) 97.3 F (36.3 C)  TempSrc: Oral Oral Oral Oral  Resp: 16 18 19    Height:      Weight:   53.4 kg (117 lb 11.6 oz)   SpO2: 97% 96% 95%     Intake/Output Summary (Last 24 hours) at 01/08/13 1011 Last data filed at 01/08/13 0903  Gross per 24 hour  Intake    641 ml  Output    500 ml  Net    141 ml   Filed Weights   01/06/13 0500  01/07/13 0500 01/08/13 0559  Weight: 52.2 kg (115 lb 1.3 oz) 53.6 kg (118 lb 2.7 oz) 53.4 kg (117 lb 11.6 oz)    Exam:   General: No distress, aphasic.  Cardiovascular: Distant Heart Sounds.   Respiratory: Bilateral crackles.   Abdomen: refuse exam.   Musculoskeletal: LE bilateral edema.   Neuro : Aphasic, right side weakness.   Data Reviewed: Basic Metabolic Panel:  Recent Labs Lab 01/06/13 0154 01/06/13 0730 01/06/13 1547 01/07/13 0829 01/08/13 0817  NA 139  --  137 138 139  K 3.9  --  4.7 5.8* 4.1  CL 103  --  105 105 104  CO2 24  --  20 21 22   GLUCOSE 108*  --  104* 102* 112*  BUN 21  --  22 24* 26*  CREATININE 1.10  --  1.22* 1.22* 1.45*  CALCIUM 9.7  --  9.1 9.1 9.5  MG  --  1.8  --   --   --    Liver Function Tests: No results found for this basename: AST, ALT, ALKPHOS, BILITOT, PROT, ALBUMIN,  in the last 168 hours  Recent Labs Lab 01/06/13 0935  LIPASE 13   No results found for this basename: AMMONIA,  in the last 168 hours CBC:  Recent Labs Lab 01/06/13 0154 01/07/13 0829 01/08/13 0817  WBC 6.6 5.1 5.5  NEUTROABS 4.3  --   --  HGB 10.1* 8.5* 8.9*  HCT 31.8* 26.8* 27.8*  MCV 79.7 78.4 77.9*  PLT 278 155 229   Cardiac Enzymes:  Recent Labs Lab 01/06/13 0154 01/06/13 0730 01/06/13 1547 01/06/13 2106  TROPONINI <0.30 <0.30 <0.30 <0.30   BNP (last 3 results)  Recent Labs  11/06/12 0825 11/27/12 1821 01/06/13 0154  PROBNP 12374.0* 9596.0* 10506.0*   CBG:  Recent Labs Lab 01/07/13 0555 01/07/13 1125 01/07/13 1631 01/07/13 2102 01/08/13 0615  GLUCAP 118* 133* 132* 153* 147*    No results found for this or any previous visit (from the past 240 hour(s)).   Studies: Dg Abd 1 View  01/06/2013  *RADIOLOGY REPORT*  Clinical Data: Generalized abdominal pain.  ABDOMEN - 1 VIEW  Comparison: 08/25/2012 and chest film of earlier in the day.  Findings: 2 supine views.  The first demonstrates an AICD device terminating over the  left ventricle.  An IVC filter terminates at the L2-L3 level.  No gaseous distention of bowel loops.  No gross free intraperitoneal air.  Marked enlargement of cardiopericardial silhouette.  The second image demonstrates gas within the stomach and rectosigmoid region.  No pneumatosis.  Phleboliths in the pelvis. Artifactual linear lucency projecting over the upper abdomen.  IMPRESSION: No acute findings.   Original Report Authenticated By: Jeronimo Love, M.D.    Ct Head Wo Contrast  01/07/2013  *RADIOLOGY REPORT*  Clinical Data: Headache.  History of stroke  CT HEAD WITHOUT CONTRAST  Technique:  Contiguous axial images were obtained from the base of the skull through the vertex without contrast.  Comparison: 11/30/2012  Findings: Large area of chronic infarction left MCA territory is unchanged.  No acute infarct.  Negative for hemorrhage or mass. Negative for hydrocephalus.  IMPRESSION: Chronic left MCA infarct.  No interval change from the prior study.   Original Report Authenticated By: Elizabeth Love, M.D.     Scheduled Meds: . amiodarone  400 mg Oral Daily  . aspirin EC  81 mg Oral Daily  . docusate sodium  100 mg Oral BID  . escitalopram  10 mg Oral Daily  . feeding supplement  237 mL Oral BID BM  . furosemide  60 mg Intravenous Q8H  . levETIRAcetam  1,000 mg Intravenous Q12H  . levothyroxine  50 mcg Oral QAC breakfast  . metoprolol tartrate  25 mg Oral BID  . pantoprazole  40 mg Oral Daily  . polyethylene glycol  17 g Oral BID  . QUEtiapine  50 mg Oral Daily  . Rivaroxaban  20 mg Oral Q supper  . sodium chloride  3 mL Intravenous Q12H  . sorbitol  30 mL Oral Once   Continuous Infusions:   Principal Problem:   Acute on chronic systolic CHF (congestive heart failure) Active Problems:   Aphasia as late effect of cerebrovascular accident   Atrial fibrillation   Protein C deficiency   DM type 2 (diabetes mellitus, type 2)      Elizabeth Love  [facility] Pager [phone]. If  7PM-7AM, please contact night-coverage at [email or web address], password [facility] 01/08/2013, 10:11 AM  LOS: 3 days

## 2013-01-08 NOTE — Progress Notes (Signed)
Pt came back from attempted PICC placement, pt appears to be more lethargic in afternoon, vitals WNL CBG WNL, Dr. [name] notified, will continue to monitor

## 2013-01-08 NOTE — Progress Notes (Signed)
Advanced Heart Failure Rounding Note   Subjective:    Elizabeth Love is 57 year old with a very complicated PMH of chronic systolic heart failure due to NICM (EF 10% 04/2012), status post AICD implantation (placed [facility] 2007), hypothyroidism, seizure disorder, HTN, DM2. 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 A fib (on Xarelto), RLE DVT, S/P IVC filter, and protein C deficiency.   Has had multiple admissions for various problems.   Most recently, admitted to [facility] 01/05/13 with bilateral lower extremity edema and volume overload. Started on 40 mg IV lasix every 6 hours. Pertinent admission labs included Pro BNP 10506, CEs negative, K 3.9, and Creatinine 1.1. CXR- Increasing congestive heart failure.    Yesterday she received 60- mg of IV lasix x 4. Weight down 1 pound. Crying in bed. Refused Miralex and docusate.    BMET pending    Objective:   Weight Range:  Vital Signs:   Temp:  [97.3 F (36.3 C)-97.9 F (36.6 C)] 97.3 F (36.3 C) (04/23 0559) Pulse Rate:  [64-88] 64 (04/23 0559) Resp:  [16-19] 19 (04/23 0559) BP: (102-114)/(59-77) 103/63 mmHg (04/23 0559) SpO2:  [95 %-97 %] 95 % (04/23 0559) Weight:  [117 lb 11.6 oz (53.4 kg)] 117 lb 11.6 oz (53.4 kg) (04/23 0559) Last BM Date: 01/05/13  Weight change: Filed Weights   01/06/13 0500 01/07/13 0500 01/08/13 0559  Weight: 115 lb 1.3 oz (52.2 kg) 118 lb 2.7 oz (53.6 kg) 117 lb 11.6 oz (53.4 kg)    Intake/Output:   Intake/Output Summary (Last 24 hours) at 01/08/13 0743 Last data filed at 01/08/13 0658  Gross per 24 hour  Intake    824 ml  Output    550 ml  Net    274 ml     Physical Exam: General: Chronically ill appearing. No respiratory difficulty Sitting in recliner. Answers some questions then falls back to sleep. Cries when touched  HEENT: normal  Neck: supple. JVD to jaw. Carotids 2+ bilat; no bruits. No lymphadenopathy or thryomegaly appreciated.  Cor: PMI nondisplaced. Irregular rate & rhythm.  No rubs, loud S 3 3/6 MR.  Lungs: clear  Abdomen: soft, nontender, nondistended. No hepatosplenomegaly. No bruits or masses. Good bowel sounds.  Extremities: no cyanosis, clubbing, rash, R and LLE 3+ edema RUE and RLE hemipharesis  Neuro: Awake but falls asleep quickly. +some expressive aphasia. R sided hemiparesis.     Labs: Basic Metabolic Panel:  Recent Labs Lab 01/06/13 0154 01/06/13 0730 01/06/13 1547 01/07/13 0829  NA 139  --  137 138  K 3.9  --  4.7 5.8*  CL 103  --  105 105  CO2 24  --  20 21  GLUCOSE 108*  --  104* 102*  BUN 21  --  22 24*  CREATININE 1.10  --  1.22* 1.22*  CALCIUM 9.7  --  9.1 9.1  MG  --  1.8  --   --     Liver Function Tests: No results found for this basename: AST, ALT, ALKPHOS, BILITOT, PROT, ALBUMIN,  in the last 168 hours  Recent Labs Lab 01/06/13 0935  LIPASE 13   No results found for this basename: AMMONIA,  in the last 168 hours  CBC:  Recent Labs Lab 01/06/13 0154 01/07/13 0829  WBC 6.6 5.1  NEUTROABS 4.3  --   HGB 10.1* 8.5*  HCT 31.8* 26.8*  MCV 79.7 78.4  PLT 278 155    Cardiac Enzymes:  Recent Labs Lab 01/06/13  0154 01/06/13 0730 01/06/13 1547 01/06/13 2106  TROPONINI <0.30 <0.30 <0.30 <0.30    BNP: BNP (last 3 results)  Recent Labs  11/06/12 0825 11/27/12 1821 01/06/13 0154  PROBNP 12374.0* 9596.0* 10506.0*     Other results:   Imaging: Dg Abd 1 View  01/06/2013  *RADIOLOGY REPORT*  Clinical Data: Generalized abdominal pain.  ABDOMEN - 1 VIEW  Comparison: 08/25/2012 and chest film of earlier in the day.  Findings: 2 supine views.  The first demonstrates an AICD device terminating over the left ventricle.  An IVC filter terminates at the L2-L3 level.  No gaseous distention of bowel loops.  No gross free intraperitoneal air.  Marked enlargement of cardiopericardial silhouette.  The second image demonstrates gas within the stomach and rectosigmoid region.  No pneumatosis.  Phleboliths in the pelvis.  Artifactual linear lucency projecting over the upper abdomen.  IMPRESSION: No acute findings.   Original Report Authenticated By: Jeronimo Greaves, M.D.    Ct Head Wo Contrast  01/07/2013  *RADIOLOGY REPORT*  Clinical Data: Headache.  History of stroke  CT HEAD WITHOUT CONTRAST  Technique:  Contiguous axial images were obtained from the base of the skull through the vertex without contrast.  Comparison: 11/30/2012  Findings: Large area of chronic infarction left MCA territory is unchanged.  No acute infarct.  Negative for hemorrhage or mass. Negative for hydrocephalus.  IMPRESSION: Chronic left MCA infarct.  No interval change from the prior study.   Original Report Authenticated By: Janeece Riggers, M.D.      Medications:     Scheduled Medications: . amiodarone  400 mg Oral Daily  . aspirin EC  81 mg Oral Daily  . docusate sodium  100 mg Oral BID  . escitalopram  10 mg Oral Daily  . feeding supplement  237 mL Oral BID BM  . furosemide  60 mg Intravenous Q8H  . levETIRAcetam  1,000 mg Intravenous Q12H  . levothyroxine  50 mcg Oral QAC breakfast  . metoprolol tartrate  25 mg Oral BID  . pantoprazole  40 mg Oral Daily  . polyethylene glycol  17 g Oral BID  . QUEtiapine  50 mg Oral Daily  . Rivaroxaban  20 mg Oral Q supper  . sodium chloride  3 mL Intravenous Q12H    Infusions:    PRN Medications: sodium chloride, acetaminophen, albuterol, ALPRAZolam, ALPRAZolam, morphine injection, ondansetron (ZOFRAN) IV, oxyCODONE, oxyCODONE-acetaminophen, sodium chloride   Assessment:  1. A/C systolic heart failure - ECHO 57% 04/2012  2. Protein C Deficiency  3. A Fib - on Xarelto  4. Seizure disorder- on chronic Keppra  5. H/O DVT  6. H/O CVA - residual right-sided weakness and expressive aphasia.  7. Hypothyroidism  8. Noncompliance  9. NSVT 10. Hyperkalemia    Plan/Discussion:    Volume status remains elevated. Continue IV lasix and diurese as much as possible. I/O not accurate due to  incontinence. BMET pending  Will need palliative care consult for goals of care.    Length of Stay: 3 Elizabeth Love,Elizabeth Love 01/08/2013, 7:43 AM Advanced Heart Failure Team Pager [phone] (M-F; 7a - 4p)  Please contact [facility] for night-coverage after hours (4p -7a ) and weekends on [email or web address]  Patient seen with NP, agree with the above.  She is screaming this morning and difficult to understand due to aphasia.  Will get psych consult and talk to family about palliative care.  She will need SNF at discharge.  She agrees to PICC this morning so will try  to get this placed as she is a difficult blood draw.  Continue Lasix IV and encourage other meds.  Elizabeth Love 01/08/2013 8:38 AM

## 2013-01-09 LAB — BASIC METABOLIC PANEL
BUN: 28 mg/dL — ABNORMAL HIGH (ref 6–23)
Calcium: 9.6 mg/dL (ref 8.4–10.5)
Creatinine, Ser: 1.41 mg/dL — ABNORMAL HIGH (ref 0.50–1.10)
GFR calc Af Amer: 48 mL/min — ABNORMAL LOW (ref >90)
GFR calc non Af Amer: 41 mL/min — ABNORMAL LOW (ref >90)

## 2013-01-09 LAB — GLUCOSE, CAPILLARY: Glucose-Capillary: 130 mg/dL — ABNORMAL HIGH (ref 70–99)

## 2013-01-09 MED ORDER — POLYETHYLENE GLYCOL 3350 17 G PO PACK
34.0000 g | PACK | Freq: Once | ORAL | Status: AC
  Administered 2013-01-09: 34 g via ORAL
  Filled 2013-01-09: qty 2

## 2013-01-09 NOTE — Progress Notes (Signed)
CSW spoke in detail with patient's son Feliz Beam today to discuss short term SNF placement. CSW has attempted to discuss with patient but she would not talk to CSW; she kept her eyes closed and her head turned away.  Patient opened her eyes and watched as I left the room but still would not speak.  CSW contacted her son Feliz Beam to discuss SNF bed search, insurance coverage etc. He was very concerned as to how long patient would have to stay in a nursing center and stated "we don't want her there very long."  After further discussion it was determined that patient currently has a CAP aide 5 hours a day- 7days a week and is followed by [facility]. Son states that his mother is "stubborn" and at home will often control when she will agree to take her meds or do things.  "She doesn't respond well to strangers telling her what to do either."  Son feels that they manage well at home and she is never left alone.  Her brother states with her during the day and he is home at night;  The aide assists with baths etc.  CSW asked son to talk with his uncle and if possible with patient to decide what disposition they want for patient. She will be medically stable for d/c tomorrow per Dr. [name].  Above was discussed with him;  CSW will call son in the a.m to decided next course of action.  [name]. [place]  [phone]

## 2013-01-09 NOTE — Plan of Care (Signed)
Problem: Phase I Progression Outcomes Goal: EF % per last Echo/documented,Core Reminder form on chart Outcome: Completed/Met Date Met:  01/09/13 EF 10%(01-07-13)

## 2013-01-09 NOTE — Progress Notes (Signed)
Advanced Heart Failure Rounding Note   Subjective:    Elizabeth Love is 57 year old with a very complicated PMH of chronic systolic heart failure due to NICM (EF 10% 04/2012), status post AICD implantation (placed [facility] 2007), hypothyroidism, seizure disorder, HTN, DM2. 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 A fib (on Xarelto), RLE DVT, S/P IVC filter, and protein C deficiency.   Has had multiple admissions for various problems.   Most recently, admitted to [facility] 01/05/13 with bilateral lower extremity edema and volume overload. Started on 40 mg IV lasix every 6 hours. Pertinent admission labs included Pro BNP 10506, CEs negative, K 3.9, and Creatinine 1.1. CXR- Increasing congestive heart failure.    Diuresing with IV lasix. Weight down 1 pound. I/O not accurate due to urinary incontinence. Refused night time metoprolol, xarelto, colace and miralex. Crying says that she feels bad.   Psych Consult - today to determine capacity.       Objective:   Weight Range:  Vital Signs:   Temp:  [97.3 F (36.3 C)-97.8 F (36.6 C)] 97.7 F (36.5 C) (04/24 0500) Pulse Rate:  [63-69] 69 (04/24 0500) Resp:  [18] 18 (04/24 0500) BP: (106-118)/(65-78) 118/77 mmHg (04/24 0500) SpO2:  [93 %-96 %] 96 % (04/24 0500) Weight:  [116 lb 13.5 oz (53 kg)] 116 lb 13.5 oz (53 kg) (04/24 0500) Last BM Date: 01/05/13  Weight change: Filed Weights   01/07/13 0500 01/08/13 0559 01/09/13 0500  Weight: 118 lb 2.7 oz (53.6 kg) 117 lb 11.6 oz (53.4 kg) 116 lb 13.5 oz (53 kg)    Intake/Output:   Intake/Output Summary (Last 24 hours) at 01/09/13 1610 Last data filed at 01/08/13 1016  Gross per 24 hour  Intake      0 ml  Output    250 ml  Net   -250 ml     Physical Exam: General: Chronically ill appearing. No respiratory difficulty Sitting in recliner. Answers some questions then falls back to sleep. Cries when touched  HEENT: normal  Neck: supple. JVD to jaw. Carotids 2+ bilat; no bruits.  No lymphadenopathy or thryomegaly appreciated.  Cor: PMI nondisplaced. Irregular rate & rhythm. No rubs, loud S 3 3/6 MR.  Lungs: clear  Abdomen: soft, nontender, nondistended. No hepatosplenomegaly. No bruits or masses. Good bowel sounds.  Extremities: no cyanosis, clubbing, rash, R and LLE 2+ edema RUE and RLE hemipharesis  Neuro: Awake but falls asleep quickly. +some expressive aphasia. R sided hemiparesis.     Labs: Basic Metabolic Panel:  Recent Labs Lab 01/06/13 0154 01/06/13 0730 01/06/13 1547 01/07/13 0829 01/08/13 0817 01/09/13 0650  NA 139  --  137 138 139 141  K 3.9  --  4.7 5.8* 4.1 4.0  CL 103  --  105 105 104 105  CO2 24  --  20 21 22 25   GLUCOSE 108*  --  104* 102* 112* 99  BUN 21  --  22 24* 26* 28*  CREATININE 1.10  --  1.22* 1.22* 1.45* 1.41*  CALCIUM 9.7  --  9.1 9.1 9.5 9.6  MG  --  1.8  --   --   --   --     Liver Function Tests: No results found for this basename: AST, ALT, ALKPHOS, BILITOT, PROT, ALBUMIN,  in the last 168 hours  Recent Labs Lab 01/06/13 0935  LIPASE 13   No results found for this basename: AMMONIA,  in the last 168 hours  CBC:  Recent  Labs Lab 01/06/13 0154 01/07/13 0829 01/08/13 0817  WBC 6.6 5.1 5.5  NEUTROABS 4.3  --   --   HGB 10.1* 8.5* 8.9*  HCT 31.8* 26.8* 27.8*  MCV 79.7 78.4 77.9*  PLT 278 155 229    Cardiac Enzymes:  Recent Labs Lab 01/06/13 0154 01/06/13 0730 01/06/13 1547 01/06/13 2106  TROPONINI <0.30 <0.30 <0.30 <0.30    BNP: BNP (last 3 results)  Recent Labs  11/06/12 0825 11/27/12 1821 01/06/13 0154  PROBNP 12374.0* 9596.0* 10506.0*     Other results:   Imaging: No results found.   Medications:     Scheduled Medications: . amiodarone  400 mg Oral Daily  . aspirin EC  81 mg Oral Daily  . docusate sodium  100 mg Oral BID  . escitalopram  10 mg Oral Daily  . feeding supplement  237 mL Oral BID BM  . furosemide  60 mg Intravenous Q8H  . levETIRAcetam  1,000 mg  Intravenous Q12H  . levothyroxine  50 mcg Oral QAC breakfast  . metoprolol tartrate  25 mg Oral BID  . pantoprazole  40 mg Oral Daily  . polyethylene glycol  17 g Oral BID  . QUEtiapine  50 mg Oral Daily  . Rivaroxaban  20 mg Oral Q supper  . sodium chloride  3 mL Intravenous Q12H  . sorbitol  30 mL Oral Once    Infusions:    PRN Medications: sodium chloride, acetaminophen, albuterol, ALPRAZolam, ALPRAZolam, butalbital-acetaminophen-caffeine, morphine injection, ondansetron (ZOFRAN) IV, oxyCODONE, oxyCODONE-acetaminophen, sodium chloride   Assessment:  1. A/C systolic heart failure - ECHO 91% 04/2012  2. Protein C Deficiency  3. A Fib - on Xarelto  4. Seizure disorder- on chronic Keppra  5. H/O DVT  6. H/O CVA - residual right-sided weakness and expressive aphasia.  7. Hypothyroidism  8. Noncompliance  9. NSVT 10. Hyperkalemia    Plan/Discussion:   Crying this am and continues to refuse some medications.   Volume status slightly better.  Continue IV lasix and diurese as much as possible. I/O not accurate due to incontinence. Creatinine up to 1.4. WIll need to consider turning off ICD.   Psych capacity pending.   Will need palliative care consult for goals of care. Will need SNF.    Length of Stay: 4 CLEGG,AMY 01/09/2013, 8:32 AM Advanced Heart Failure Team Pager [phone] (M-F; 7a - 4p)  Please contact [facility] for night-coverage after hours (4p -7a ) and weekends on [email or web address]  Patient will not talk to me this morning.  She is sleepy but awakens.  She still appears volume overloaded.  Would continue IV Lasix.  She is refusing meds frequently.  She needs psych evaluation, likely palliative care evaluation, SNF placement.  Will see about turning off ICD after psych eval.   Marca Ancona 01/09/2013 11:08 AM

## 2013-01-09 NOTE — Progress Notes (Signed)
Palliative Medicine Team SW PMT has been shadowing, were awaiting psych consult and medical team contact with family before engaging. Note pt's refusal to engage with psych's attempts to assess capacity. Met with pt at bedside who presents with restricted affect, limited eye contact, currently eating cereal. Pt did talk a bit, oriented only to self, unable to give place and time. Did give son Feliz Beam and brother Donald's names as next of kin and also endorsed sadness.   Reviewed with Dr. [name] who reports likely plan for pt to d/c to SNF, recommends outpt PCS to follow at d/c where pt can receive necessary time and encouragement to participate in goals of care discussion. PMT to sign off, please re consult as needed.   Kennieth Francois, LCSWA  PMT Phone [phone]

## 2013-01-09 NOTE — Progress Notes (Signed)
[facility] PROGRESS NOTE  Elizabeth Love ZOX:[id number] DOB: [birth year] DOA: 01/05/2013 PCP: [name], [name], MD  Assessment/Plan:   1. Acute on Chronic Systolic Heart Failure: Continue with IV lasix 60 mg IV Q 8 hours.  Strict I and O. Hold  Lisinopril due to hyperkalemia. Last ECHO 04-2012. ECHO pending. Appreciate Heart Failure team.  SW consulted for placement. Patient in agreement for SNF placement 4/23. Psych evaluation for Capacity requested as well.  2. Abdominal Pain from massive constipation : KUB negative for obstruction. Lipase normal. Refusing laxatives.  3. A.Fib/ Protein C deficiency - continue rate control and Xarelto. Continue with amiodarone. 4. H/o Seizures - continue home keppra 5. H/O stroke with resultant Aphasia and right side weakness. Continue with Xarelto. 6. History of DVT 06/2012: RLE DVT, + protein C deficiency, placed on Xarelto. s/p IVC filter  7. History of Headaches, Chronic. No worsening neuro deficit.  CT head: no acute finding. fioricet prn  8. Hyperkalemia: Continue with lasix. Discontinued KCL Supplement.  9. Anemia: monitor Hb.     Code Status: Full Family Communication: None at bedside.  Disposition Plan:  SNF   Consultants:  Cardiology.   Procedures:  None.  Antibiotics:  None.  HPI/Subjective: Still complaining of  Abdominal pain.    Objective: Filed Vitals:   01/08/13 1430 01/08/13 1736 01/08/13 2050 01/09/13 0500  BP: 116/65 106/65 111/72 118/77  Pulse: 68 63 66 69  Temp: 97.6 F (36.4 C)  97.8 F (36.6 C) 97.7 F (36.5 C)  TempSrc: Oral  Oral Oral  Resp: 18  18 18   Height:      Weight:    53 kg (116 lb 13.5 oz)  SpO2: 93% 96% 96% 96%    Intake/Output Summary (Last 24 hours) at 01/09/13 0756 Last data filed at 01/08/13 1016  Gross per 24 hour  Intake      0 ml  Output    250 ml  Net   -250 ml   Filed Weights   01/07/13 0500 01/08/13 0559 01/09/13 0500  Weight: 53.6 kg (118 lb 2.7 oz) 53.4 kg  (117 lb 11.6 oz) 53 kg (116 lb 13.5 oz)    Exam:   General: No distress, aphasic.  Cardiovascular: Distant Heart Sounds.   Respiratory: Bilateral crackles.   Abdomen: refuse exam.   Musculoskeletal: LE bilateral edema.   Neuro : Aphasic, right side weakness.   Data Reviewed: Basic Metabolic Panel:  Recent Labs Lab 01/06/13 0154 01/06/13 0730 01/06/13 1547 01/07/13 0829 01/08/13 0817 01/09/13 0650  NA 139  --  137 138 139 141  K 3.9  --  4.7 5.8* 4.1 4.0  CL 103  --  105 105 104 105  CO2 24  --  20 21 22 25   GLUCOSE 108*  --  104* 102* 112* 99  BUN 21  --  22 24* 26* 28*  CREATININE 1.10  --  1.22* 1.22* 1.45* 1.41*  CALCIUM 9.7  --  9.1 9.1 9.5 9.6  MG  --  1.8  --   --   --   --    Liver Function Tests: No results found for this basename: AST, ALT, ALKPHOS, BILITOT, PROT, ALBUMIN,  in the last 168 hours  Recent Labs Lab 01/06/13 0935  LIPASE 13   No results found for this basename: AMMONIA,  in the last 168 hours CBC:  Recent Labs Lab 01/06/13 0154 01/07/13 0829 01/08/13 0817  WBC 6.6 5.1 5.5  NEUTROABS 4.3  --   --  HGB 10.1* 8.5* 8.9*  HCT 31.8* 26.8* 27.8*  MCV 79.7 78.4 77.9*  PLT 278 155 229   Cardiac Enzymes:  Recent Labs Lab 01/06/13 0154 01/06/13 0730 01/06/13 1547 01/06/13 2106  TROPONINI <0.30 <0.30 <0.30 <0.30   BNP (last 3 results)  Recent Labs  11/06/12 0825 11/27/12 1821 01/06/13 0154  PROBNP 12374.0* 9596.0* 10506.0*   CBG:  Recent Labs Lab 01/08/13 0615 01/08/13 1127 01/08/13 1618 01/08/13 2121 01/09/13 0607  GLUCAP 147* 106* 100* 90 103*    No results found for this or any previous visit (from the past 240 hour(s)).   Studies: No results found.  Scheduled Meds: . amiodarone  400 mg Oral Daily  . aspirin EC  81 mg Oral Daily  . docusate sodium  100 mg Oral BID  . escitalopram  10 mg Oral Daily  . feeding supplement  237 mL Oral BID BM  . furosemide  60 mg Intravenous Q8H  . levETIRAcetam  1,000  mg Intravenous Q12H  . levothyroxine  50 mcg Oral QAC breakfast  . metoprolol tartrate  25 mg Oral BID  . pantoprazole  40 mg Oral Daily  . polyethylene glycol  17 g Oral BID  . QUEtiapine  50 mg Oral Daily  . Rivaroxaban  20 mg Oral Q supper  . sodium chloride  3 mL Intravenous Q12H  . sorbitol  30 mL Oral Once   Continuous Infusions:   Principal Problem:   Acute on chronic systolic CHF (congestive heart failure) Active Problems:   Aphasia as late effect of cerebrovascular accident   Atrial fibrillation   Protein C deficiency   DM type 2 (diabetes mellitus, type 2)      Elizabeth Love  [facility] Pager [phone]. If 7PM-7AM, please contact night-coverage at [email or web address], password [facility] 01/09/2013, 7:56 AM  LOS: 4 days

## 2013-01-09 NOTE — Evaluation (Signed)
Physical Therapy Evaluation Patient Details Name: Elizabeth Love MRN: [id number] DOB: 30-Jan-1956 Today's Date: 01/09/2013 Time: 4098-1191 PT Time Calculation (min): 18 min  PT Assessment / Plan / Recommendation Clinical Impression  Pt is a 57 y/o female with history of CVA with R UE/LE hemiplegia, and aphasia.  Unclear if pt has receptive aphasia or if pt was being uncooperative but pt would not follow any commands from PT.  Unlikely pt will be appropriate to participate in PT in acute setting.  Pt to have psych eval to determine pt's cognitive capacity.   Will attempt to see pt once more to determine pt ability/williness to participate in PT.     PT Assessment  Patient needs continued PT services    Follow Up Recommendations  SNF;Supervision/Assistance - 24 hour    Does the patient have the potential to tolerate intense rehabilitation      Barriers to Discharge Other (comment) unclear    Equipment Recommendations  None recommended by PT    Recommendations for Other Services     Frequency Min 2X/week    Precautions / Restrictions Precautions Precautions: Fall Restrictions Weight Bearing Restrictions: No   Pertinent Vitals/Pain C/o abdominal pain but unable to rate.        Mobility  Bed Mobility Bed Mobility: Sit to Supine;Sitting - Scoot to Edge of Bed;Rolling Left Rolling Right: 5: Supervision;With rail (Performed with OT. ) Rolling Left: 5: Supervision;With rail Right Sidelying to Sit: 3: Mod assist;HOB flat (Performed with OT.  ) Sitting - Scoot to Edge of Bed: 5: Supervision Sit to Supine: 1: +2 Total assist Sit to Supine: Patient Percentage: 0% Details for Bed Mobility Assistance: Attempted to have pt transition from sitting to supine.  Pt verbalizing "1,2,3" then pointing at foot of bed.  Unclear what pt was trying to communicate. +2 assist to manage trunk and bilateral LEs to transition to supine with no active participation from pt.   Transfers Transfers: Not  assessed Sit to Stand: Not tested (comment) (Pt refusal to participate. did perform task with OT ) Stand to Sit: Not tested (comment) (Refused to participate. Peformed with OT ) Details for Transfer Assistance: Pt refused to participate despite tactilce cues via anterior weight shifting.  Pt perfromed stand pivot transfers bed<--> 3 in 1 with OT prior to PT arrival.    Ambulation/Gait Ambulation/Gait Assistance: Not tested (comment) (Pt refused ) Wheelchair Mobility Wheelchair Mobility: No    Exercises     PT Diagnosis: Generalized weakness;Acute pain;Hemiplegia non-dominant side;Altered mental status  PT Problem List: Decreased mobility PT Treatment Interventions: Functional mobility training;Therapeutic activities;Neuromuscular re-education;Patient/family education   PT Goals Acute Rehab PT Goals PT Goal Formulation: Patient unable to participate in goal setting Time For Goal Achievement: 01/23/13 Potential to Achieve Goals: Fair Pt will go Supine/Side to Sit: with supervision PT Goal: Supine/Side to Sit - Progress: Goal set today Pt will go Sit to Supine/Side: with supervision PT Goal: Sit to Supine/Side - Progress: Goal set today Pt will Transfer Bed to Chair/Chair to Bed: with supervision PT Transfer Goal: Bed to Chair/Chair to Bed - Progress: Goal set today  Visit Information  Last PT Received On: 01/09/13 Assistance Needed: +1    Subjective Data  Subjective: Verbal communication limited secondary to aphasia, C/o pain in stomach.   Patient Stated Goal: none stated.   Prior Functioning  Home Living Additional Comments: Unknown  and pt unable to give due to aphasia Prior Function Comments: Unknown and pt unable to give due to  aphasia Communication Communication: Expressive difficulties;Receptive difficulties Dominant Hand: Left    Cognition  Cognition Arousal/Alertness: Awake/alert Behavior During Therapy: Agitated Overall Cognitive Status: Impaired/Different from  baseline    Extremity/Trunk Assessment Right Upper Extremity Assessment RUE ROM/Strength/Tone: Deficits RUE ROM/Strength/Tone Deficits: Old CVA--no functional use, pt does attend to it when moving. Left Upper Extremity Assessment LUE ROM/Strength/Tone: Within functional levels Right Lower Extremity Assessment RLE ROM/Strength/Tone: Deficits RLE ROM/Strength/Tone Deficits: Old CVA limited use of RLE unable to accurately assess due to lack of pt cooperation.   Left Lower Extremity Assessment LLE ROM/Strength/Tone: Deficits LLE ROM/Strength/Tone Deficits: Pt unable to lift LLE into bed without assistance.     Balance Balance Balance Assessed: Yes Static Sitting Balance Static Sitting - Balance Support: No upper extremity supported;Feet supported Static Sitting - Level of Assistance:  (Supervision) Static Sitting - Comment/# of Minutes: 10 minutes  End of Session PT - End of Session Equipment Utilized During Treatment: Gait belt Activity Tolerance: Treatment limited secondary to agitation;Other (comment) (questionable cognitive status ) Patient left: in bed;with call bell/phone within reach;with bed alarm set Nurse Communication: Mobility status  GP     Satia Winger 01/09/2013, 12:59 PM Ammiel Guiney L. Zechariah Bissonnette DPT [phone]

## 2013-01-09 NOTE — Evaluation (Signed)
Occupational Therapy Evaluation Patient Details Name: Elizabeth Love MRN: [id number] DOB: August 19, 1956 Today's Date: 01/09/2013 Time: 5621-3086 OT Time Calculation (min): 31 min  OT Assessment / Plan / Recommendation Clinical Impression  This 57 yo female admitted with BLE edema, CHF, and questionable worse cognitive issues presents to acute OT with problems below. Will benefit from acute OT with follow up at SNF.    OT Assessment  Patient needs continued OT Services    Follow Up Recommendations  SNF    Barriers to Discharge Decreased caregiver support    Equipment Recommendations  None recommended by OT       Frequency  Min 2X/week    Precautions / Restrictions Precautions Precautions: Fall Restrictions Weight Bearing Restrictions: No   Pertinent Vitals/Pain Stomach pain, let RN know and she came in during session to give her meds    ADL  Eating/Feeding: Performed;Set up;Supervision/safety (for drinking from cup) Where Assessed - Eating/Feeding: Edge of bed Grooming: Simulated;Set up;Supervision/safety Where Assessed - Grooming: Unsupported sitting Upper Body Bathing: Simulated;Minimal assistance Where Assessed - Upper Body Bathing: Unsupported sitting Lower Body Bathing: Simulated;Maximal assistance Where Assessed - Lower Body Bathing: Supported sit to stand Upper Body Dressing: Simulated;Maximal assistance Where Assessed - Upper Body Dressing: Unsupported sitting Lower Body Dressing: Simulated;+1 Total assistance Where Assessed - Lower Body Dressing: Supported sit to stand Toilet Transfer: Performed;Minimal assistance Toilet Transfer Method: Forensic scientist Equipment: Bedside commode (going to her left then right back to bed) Toileting - Clothing Manipulation and Hygiene: Performed;Moderate assistance Where Assessed - Toileting Clothing Manipulation and Hygiene: Standing Transfers/Ambulation Related to ADLs: Min A sit to stand and stand to sit--did not  ambulate    OT Diagnosis: Generalized weakness;Cognitive deficits;Acute pain;Hemiplegia non-dominant side  OT Problem List: Decreased range of motion;Decreased cognition;Decreased activity tolerance;Impaired balance (sitting and/or standing);Pain;Impaired UE functional use;Impaired tone OT Treatment Interventions: Self-care/ADL training;Therapeutic activities;DME and/or AE instruction;Patient/family education;Cognitive remediation/compensation;Balance training   OT Goals Acute Rehab OT Goals OT Goal Formulation: Patient unable to participate in goal setting Time For Goal Achievement: 01/23/13 Potential to Achieve Goals: Good ADL Goals Pt Will Perform Grooming: Standing at sink (min guard A ) ADL Goal: Grooming - Progress: Goal set today Pt Will Transfer to Toilet: Ambulation;with DME;3-in-1 (min guard A) ADL Goal: Toilet Transfer - Progress: Goal set today Pt Will Perform Toileting - Clothing Manipulation: with supervision;Standing ADL Goal: Toileting - Clothing Manipulation - Progress: Goal set today Pt Will Perform Toileting - Hygiene: with supervision;Sit to stand from 3-in-1/toilet ADL Goal: Toileting - Hygiene - Progress: Goal set today Miscellaneous OT Goals Miscellaneous OT Goal #1: Pt will be S in and OOB for BADLs OT Goal: Miscellaneous Goal #1 - Progress: Goal set today Miscellaneous OT Goal #2: Pt will follow one step verbal/gestural commands 25% of session OT Goal: Miscellaneous Goal #2 - Progress: Goal set today  Visit Information  Last OT Received On: 01/09/13 Assistance Needed: +1    Subjective Data  Subjective: I'm cold" (when I uncovered him)   Prior Functioning     Home Living Additional Comments: Unknown  and pt unable to give due to aphasia Prior Function Comments: Unknown and pt unable to give due to aphasia Communication Communication: Expressive difficulties;Receptive difficulties Dominant Hand: Left         Vision/Perception Vision -  History Baseline Vision:  (Unknown and pt unable to tell due to aphasia)   Cognition  Cognition Arousal/Alertness: Awake/alert Behavior During Therapy:  (Hit or miss with cooperation) Overall Cognitive Status:  Impaired/Different from baseline    Extremity/Trunk Assessment Right Upper Extremity Assessment RUE ROM/Strength/Tone: Deficits RUE ROM/Strength/Tone Deficits: Old CVA--no functional use, pt does attend to it when moving. Left Upper Extremity Assessment LUE ROM/Strength/Tone: Within functional levels     Mobility Bed Mobility Bed Mobility: Sit to Supine;Sitting - Scoot to Edge of Bed;Rolling Left Rolling Right: 5: Supervision;With rail Rolling Left: 5: Supervision;With rail Right Sidelying to Sit: 3: Mod assist;HOB flat Sitting - Scoot to Edge of Bed: 5: Supervision Sit to Supine: 1: +2 Total assist Sit to Supine: Patient Percentage: 0% (Pt would not attempt to lay down, so we had to lay her down) Transfers Transfers: Sit to Stand;Stand to Sit Sit to Stand: 4: Min assist;With upper extremity assist;From bed Stand to Sit: 4: Min assist;With upper extremity assist;With armrests;To chair/3-in-1        Balance Balance Balance Assessed: Yes Static Sitting Balance Static Sitting - Balance Support: No upper extremity supported;Feet supported Static Sitting - Level of Assistance:  (Supervision) Static Sitting - Comment/# of Minutes: 10 minutes   End of Session OT - End of Session Activity Tolerance:  (limited due to decreased cooperation) Patient left: in bed;with call bell/phone within reach;with bed alarm set       Evette Georges [phone] 01/09/2013, 10:39 AM

## 2013-01-09 NOTE — Consult Note (Signed)
Reason for Consult: Capacity evaluation Referring Physician: Dr. Viviano Simas Elizabeth Love is an 57 y.o. female.  HPI: Met with patient for psychiatric follow up on capacity evaluation. Patient would not talk to evaluator. She kept her eyes closed and her head down all the time. Patient did not answer for simple and general question. It is difficult to engage the patient. Patient opened her eyes and than closed within short time. The evaluator contacted patient staff RN who stated that patient has been playing because she does not like to talk. Informed the above behavior and uncooperativeness of the patient behavior to Dr. [name] who agree to contact psychiatry when patient become more cooperative. As per CSW note patient is "stubborn" and at home will often control when she will agree to take her meds or do things. "She doesn't respond well to strangers telling her what to do either."    Reportedly she will be medically stable for d/c tomorrow per Dr. [name]. Above was discussed with him; CSW will call son in the a.m to decided next course of action. [name]. Crowder.   MSE: Patient was laying down in her bed and seems like she is selectively talks with few staff members.  Past Medical History  Diagnosis Date  . Seizures   . Hypertension   . Stroke     2007 - R sided weakness and expressive aphasia with h/o behavioral problems related to this  . Asthma   . Diabetes mellitus   . 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 Chronic systolic CHF (congestive heart failure)     a. s/p AICD ~2007 ([employer]) - previous care [facility]. No known hx CAD. b. EF 10% by echo 04/2012.  Marland Kitchen Atrial fibrillation     04/2012 - not felt to be an anticoag candidate long term at that time, but later had DVT so was on placed on Xarelto  . Coagulopathy     "Protein C deficiency"  . History of DVT (deep vein thrombosis)     a. 06/2012: RLE DVT, + protein C deficiency, placed on Xarelto. s/p IVC filter  . Noncompliance      Due to mental status, family has had to coax her to take medicines  . Hypothyroidism   . VT (ventricular tachycardia)     a. h/o ICD ~2007-2008. b. Adm 08/2012 with UTI, had VT/ICD shock x 5 (hypokalemic)  . Protein C deficiency     Past Surgical History  Procedure Laterality Date  . Cholecystectomy    . Appendectomy    . Pacemaker insertion    . Vena cava filter placement  07/19/2012    Procedure: INSERTION VENA-CAVA FILTER;  Surgeon: Larina Earthly, MD;  Location: [facility] OR;  Service: Vascular;  Laterality: [name];  . Insert / replace / remove pacemaker      ICD  . Abdominal hysterectomy      Family History  Problem Relation Age of Onset  . Coronary artery disease Neg Hx   . Hypertension Mother     Social History:  reports that she has quit smoking. She does not have any smokeless tobacco history on file. She reports that she does not drink alcohol or use illicit drugs.  Allergies:  Allergies  Allergen Reactions  . Penicillins Rash  . Sulfa Antibiotics Rash    Medications: I have reviewed the patient's current medications.  Results for orders placed during the hospital encounter of 01/05/13 (from the past 48  hour(s))  GLUCOSE, CAPILLARY     Status: Abnormal   Collection Time    01/07/13  9:02 PM      Result Value Range   Glucose-Capillary 153 (*) 70 - 99 mg/dL   Comment 1 Notify RN    GLUCOSE, CAPILLARY     Status: Abnormal   Collection Time    01/08/13  6:15 AM      Result Value Range   Glucose-Capillary 147 (*) 70 - 99 mg/dL   Comment 1 Documented in Chart     Comment 2 Notify RN    BASIC METABOLIC PANEL     Status: Abnormal   Collection Time    01/08/13  8:17 AM      Result Value Range   Sodium 139  135 - 145 mEq/L   Potassium 4.1  3.5 - 5.1 mEq/L   Chloride 104  96 - 112 mEq/L   CO2 22  19 - 32 mEq/L   Glucose, Bld 112 (*) 70 - 99 mg/dL   BUN 26 (*) 6 - 23 mg/dL   Creatinine, Ser 8.65 (*) 0.50 - 1.10 mg/dL   Calcium 9.5  8.4 - 78.4 mg/dL   GFR calc non Af  Amer 40 (*) >90 mL/min   GFR calc Af Amer 46 (*) >90 mL/min   Comment:            The eGFR has been calculated     using the CKD EPI equation.     This calculation has not been     validated in all clinical     situations.     eGFR's persistently     <90 mL/min signify     possible Chronic Kidney Disease.  CBC     Status: Abnormal   Collection Time    01/08/13  8:17 AM      Result Value Range   WBC 5.5  4.0 - 10.5 K/uL   RBC 3.57 (*) 3.87 - 5.11 MIL/uL   Hemoglobin 8.9 (*) 12.0 - 15.0 g/dL   HCT 69.6 (*) 29.5 - 28.4 %   MCV 77.9 (*) 78.0 - 100.0 fL   MCH 24.9 (*) 26.0 - 34.0 pg   MCHC 32.0  30.0 - 36.0 g/dL   RDW 13.2 (*) 44.0 - 10.2 %   Platelets 229  150 - 400 K/uL  GLUCOSE, CAPILLARY     Status: Abnormal   Collection Time    01/08/13 11:27 AM      Result Value Range   Glucose-Capillary 106 (*) 70 - 99 mg/dL   Comment 1 Documented in Chart     Comment 2 Notify RN    GLUCOSE, CAPILLARY     Status: Abnormal   Collection Time    01/08/13  4:18 PM      Result Value Range   Glucose-Capillary 100 (*) 70 - 99 mg/dL   Comment 1 Notify RN    GLUCOSE, CAPILLARY     Status: None   Collection Time    01/08/13  9:21 PM      Result Value Range   Glucose-Capillary 90  70 - 99 mg/dL   Comment 1 Notify RN    GLUCOSE, CAPILLARY     Status: Abnormal   Collection Time    01/09/13  6:07 AM      Result Value Range   Glucose-Capillary 103 (*) 70 - 99 mg/dL   Comment 1 Notify RN    BASIC METABOLIC PANEL  Status: Abnormal   Collection Time    01/09/13  6:50 AM      Result Value Range   Sodium 141  135 - 145 mEq/L   Potassium 4.0  3.5 - 5.1 mEq/L   Chloride 105  96 - 112 mEq/L   CO2 25  19 - 32 mEq/L   Glucose, Bld 99  70 - 99 mg/dL   BUN 28 (*) 6 - 23 mg/dL   Creatinine, Ser 9.60 (*) 0.50 - 1.10 mg/dL   Calcium 9.6  8.4 - 45.4 mg/dL   GFR calc non Af Amer 41 (*) >90 mL/min   GFR calc Af Amer 48 (*) >90 mL/min   Comment:            The eGFR has been calculated     using  the CKD EPI equation.     This calculation has not been     validated in all clinical     situations.     eGFR's persistently     <90 mL/min signify     possible Chronic Kidney Disease.  GLUCOSE, CAPILLARY     Status: Abnormal   Collection Time    01/09/13 11:26 AM      Result Value Range   Glucose-Capillary 141 (*) 70 - 99 mg/dL   Comment 1 Notify RN    GLUCOSE, CAPILLARY     Status: Abnormal   Collection Time    01/09/13  4:32 PM      Result Value Range   Glucose-Capillary 115 (*) 70 - 99 mg/dL   Comment 1 Notify RN      Ir US Guide Vasc Access Right  01/09/2013  *RADIOLOGY REPORT*  IR ULTRASOUND GUIDED VASCULAR ACCESS RIGHT  Date: 01/08/2013  Clinical History: 62 old female with chronic systolic heart failure, seizure disorder, prior cerebrovascular accident and agitation.  She requires durable venous access and a PICC is requested.  Procedures Performed: 1. Ultrasound-guided puncture of the right basilic and brachial veins  Interventional Radiologist:  Sterling Big, MD  Fluoroscopy time: 12 seconds  Contrast volume: Zero  PROCEDURE/FINDINGS:  Informed consent was obtained from the patient's son following explanation of the procedure, risks, benefits and alternatives. The patient is extremely agitated and could not be capped on the fluoroscopy table safely.  Therefore, 1 mg of IV at of an was administered and after the patient was relaxed she was repositioned on the fluoroscopy table.  A time out was performed.  Maximal barrier sterile technique utilized including caps, mask, sterile gowns, sterile gloves, large sterile drape, hand hygiene, and betadine skin prep. The right upper extremity was interrogated with ultrasound.  The basilic vein and paired brachial veins are patent without evidence of thrombosis.  The vessels are fairly small.  Under direct sonographic guidance, first the basilic vein was accessed.  However, before wire could be advanced through the needle the patient flex  her arms forcefully which displace the needle and resulted in an immediate hematoma and resultant venous spasm.  Hemostasis was obtained by gentle manual pressure.  The patient was again, down.  A second attempt was made this time the right brachial vein was punctured under ultrasound guidance. Again, before a wire could be passed through the needle the patient forcefully flexed her arms and displacing the needle several centimeters within the soft tissue.  At this point, hemostasis was held and the procedure was terminated.  IMPRESSION:  Unsuccessful attempted PICC placement secondary to patient agitation and forceful moving  of her upper extremities.  In her current state, she is at relatively high risk for nerve or arterial injury.  Recommend reattempting PICC placement after appropriate sedation.  Signed,  Sterling Big, MD Vascular & Interventional Radiologist [facility]   Original Report Authenticated By: Malachy Moan, M.D.     Positive for Multiple medical problems Blood pressure 124/80, pulse 53, temperature 97.5 F (36.4 C), temperature source Oral, resp. rate 18, height 5\' 2"  (1.575 m), weight 116 lb 13.5 oz (53 kg), SpO2 99.00%.   Assessment/Plan: Case discussed with Dr. [name]. Psychiatry will sign off at this time due failed attempt due to selective mutism and un cooperative behaviors and may contact again when patient can be cooperative to participate in evaluation. Encourage support and engagement with family members for future assistance as she does not talk to strangers.  Koy Lamp,JANARDHAHA R. 01/09/2013, 7:32 PM

## 2013-01-09 NOTE — Progress Notes (Signed)
PT Cancellation Note  Patient Details Name: Elizabeth Love MRN: [id number] DOB: [birth year]   Cancelled Treatment:    Reason Eval/Treat Not Completed: Other (comment) (pt awaiting psych eval to determine capacity.  )   Raeghan Demeter 01/09/2013, 8:55 AM Theron Arista L. Terrance Usery DPT [phone]

## 2013-01-10 DIAGNOSIS — Z9119 Patient's noncompliance with other medical treatment and regimen: Secondary | ICD-10-CM

## 2013-01-10 LAB — BASIC METABOLIC PANEL
BUN: 21 mg/dL (ref 6–23)
CO2: 30 mEq/L (ref 19–32)
Chloride: 100 mEq/L (ref 96–112)
GFR calc non Af Amer: 51 mL/min — ABNORMAL LOW (ref >90)
Glucose, Bld: 107 mg/dL — ABNORMAL HIGH (ref 70–99)
Potassium: 3.3 mEq/L — ABNORMAL LOW (ref 3.5–5.1)
Sodium: 141 mEq/L (ref 135–145)

## 2013-01-10 MED ORDER — FUROSEMIDE 20 MG PO TABS: 40.0000 mg | ORAL_TABLET | Freq: Every day | ORAL | Status: DC

## 2013-01-10 MED ORDER — POTASSIUM CHLORIDE ER 20 MEQ PO TBCR: 20.0000 meq | EXTENDED_RELEASE_TABLET | Freq: Every day | ORAL | Status: DC

## 2013-01-10 MED ORDER — DSS 100 MG PO CAPS: 100.0000 mg | ORAL_CAPSULE | Freq: Two times a day (BID) | ORAL | Status: DC

## 2013-01-10 MED ORDER — FUROSEMIDE 40 MG PO TABS: 40.0000 mg | ORAL_TABLET | Freq: Two times a day (BID) | ORAL | Status: DC

## 2013-01-10 MED ORDER — DIGOXIN 125 MCG PO TABS: 0.1250 mg | ORAL_TABLET | Freq: Every day | ORAL | Status: DC

## 2013-01-10 MED ORDER — BUTALBITAL-APAP-CAFFEINE 50-325-40 MG PO TABS: 1.0000 | ORAL_TABLET | ORAL | Status: DC | PRN

## 2013-01-10 MED ORDER — POLYETHYLENE GLYCOL 3350 17 G PO PACK: 17.0000 g | PACK | Freq: Two times a day (BID) | ORAL | Status: DC

## 2013-01-10 NOTE — Clinical Social Work Placement (Signed)
     Clinical Social Work Department CLINICAL SOCIAL WORK PLACEMENT NOTE 01/10/2013  Patient:  Elizabeth Love, Elizabeth Love  Account Number:  [id number] Admit date:  01/05/2013  Clinical Social Worker:  Lupita Leash Mann Skaggs, LCSWA  Date/time:  01/08/2013 06:45 PM  Clinical Social Work is seeking post-discharge placement for this patient at the following level of care:   SKILLED NURSING   (*CSW will update this form in Epic as items are completed)   01/08/2013  Patient/family provided with [facility] Department of Clinical Social Works list of facilities offering this level of care within the geographic area requested by the patient (or if unable, by the patients family).  01/08/2013  Patient/family informed of their freedom to choose among providers that offer the needed level of care, that participate in Medicare, Medicaid or managed care program needed by the patient, have an available bed and are willing to accept the patient.  01/08/2013  Patient/family informed of [facility] ownership interest in [facility], as well as of the fact that they are under no obligation to receive care at this facility.  PASARR submitted to EDS on 01/08/2013 PASARR number received from EDS on 01/08/2013  FL2 transmitted to all facilities in geographic area requested by pt/family on  01/08/2013 FL2 transmitted to all facilities within larger geographic area on   Patient informed that his/her managed care company has contracts with or will negotiate with  certain facilities, including the following:   NA     Patient/family informed of bed offers received:  01/09/2013 Patient chooses bed at  Physician recommends and patient chooses bed at    Patient to be transferred to  on   Patient to be transferred to facility by   The following physician request were entered in Epic:   Additional Comments: 01/10/13  CSW spoke with patient's son and he has decided not to seek SNF placement for his mother. He wants  to continue Home Health with [facility] and CAP services.  Discussed with RNCM who will follow up. Son feels that he and his uncle can manage patient at home. CSW will sign off. No further CSW needs identified. [name]. Shilynn Hoch, LCSWA  [phone]

## 2013-01-10 NOTE — Progress Notes (Addendum)
Advanced Heart Failure Rounding Note   Subjective:    Mrs Gieselman is 57 year old with a very complicated PMH of chronic systolic heart failure due to NICM (EF 10% 04/2012), status post AICD implantation (placed [facility] 2007), hypothyroidism, seizure disorder, HTN, DM2. 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 A fib (on Xarelto), RLE DVT, S/P IVC filter, and protein C deficiency.   Has had multiple admissions for various problems.   Most recently, admitted to [facility] 01/05/13 with bilateral lower extremity edema and volume overload. Started on 40 mg IV lasix every 6 hours. Pertinent admission labs included Pro BNP 10506, CEs negative, K 3.9, and Creatinine 1.1. CXR- Increasing congestive heart failure.   She is peeing copious amounts but I/O not accurate due to urinary incontinence. Weight done in bed and inaccurate. Continues to refuse therapies. Psych consult unable to provide any recommendations due to patient's "selective mutism" and not talking to certain providers. Contiues to cry and scream. Family met with SW and CM and have decided to take her home again (vs SNF)   Objective:   Weight Range:  Vital Signs:   Temp:  [97.5 F (36.4 C)-98.7 F (37.1 C)] 98.7 F (37.1 C) (04/25 0551) Pulse Rate:  [53-77] 77 (04/24 2019) Resp:  [18-19] 18 (04/25 0551) BP: (99-124)/(64-80) 99/64 mmHg (04/25 0551) SpO2:  [97 %-99 %] 97 % (04/25 0551) Weight:  [53.9 kg (118 lb 13.3 oz)] 53.9 kg (118 lb 13.3 oz) (04/25 0551) Last BM Date: 01/05/13  Weight change: Filed Weights   01/08/13 0559 01/09/13 0500 01/10/13 0551  Weight: 53.4 kg (117 lb 11.6 oz) 53 kg (116 lb 13.5 oz) 53.9 kg (118 lb 13.3 oz)    Intake/Output:   Intake/Output Summary (Last 24 hours) at 01/10/13 0616 Last data filed at 01/09/13 2234  Gross per 24 hour  Intake    710 ml  Output    150 ml  Net    560 ml     Physical Exam: General: Chronically ill appearing. Answers questions only selectively HEENT: normal   Neck: supple. JVP 6-7. Carotids 2+ bilat; no bruits. No lymphadenopathy or thryomegaly appreciated.  Cor: PMI nondisplaced. Irregular rate & rhythm. No rubs,+S3 3/6 MR.  Lungs: clear  Abdomen: soft, nontender, nondistended. No hepatosplenomegaly. No bruits or masses. Good bowel sounds.  Extremities: no cyanosis, clubbing, rash, no edema. RUE and RLE hemipharesis  Neuro: Aphasic. +some expressive aphasia. R sided hemiparesis.     Labs: Basic Metabolic Panel:  Recent Labs Lab 01/06/13 0154 01/06/13 0730 01/06/13 1547 01/07/13 0829 01/08/13 0817 01/09/13 0650  NA 139  --  137 138 139 141  K 3.9  --  4.7 5.8* 4.1 4.0  CL 103  --  105 105 104 105  CO2 24  --  20 21 22 25   GLUCOSE 108*  --  104* 102* 112* 99  BUN 21  --  22 24* 26* 28*  CREATININE 1.10  --  1.22* 1.22* 1.45* 1.41*  CALCIUM 9.7  --  9.1 9.1 9.5 9.6  MG  --  1.8  --   --   --   --     Liver Function Tests: No results found for this basename: AST, ALT, ALKPHOS, BILITOT, PROT, ALBUMIN,  in the last 168 hours  Recent Labs Lab 01/06/13 0935  LIPASE 13   No results found for this basename: AMMONIA,  in the last 168 hours  CBC:  Recent Labs Lab 01/06/13 0154 01/07/13 0829 01/08/13  0817  WBC 6.6 5.1 5.5  NEUTROABS 4.3  --   --   HGB 10.1* 8.5* 8.9*  HCT 31.8* 26.8* 27.8*  MCV 79.7 78.4 77.9*  PLT 278 155 229    Cardiac Enzymes:  Recent Labs Lab 01/06/13 0154 01/06/13 0730 01/06/13 1547 01/06/13 2106  TROPONINI <0.30 <0.30 <0.30 <0.30    BNP: BNP (last 3 results)  Recent Labs  11/06/12 0825 11/27/12 1821 01/06/13 0154  PROBNP 12374.0* 9596.0* 10506.0*     Other results:   Imaging: Ir US Guide Vasc Access Right  01/09/2013  *RADIOLOGY REPORT*  IR ULTRASOUND GUIDED VASCULAR ACCESS RIGHT  Date: 01/08/2013  Clinical History: 14 old female with chronic systolic heart failure, seizure disorder, prior cerebrovascular accident and agitation.  She requires durable venous access and a PICC  is requested.  Procedures Performed: 1. Ultrasound-guided puncture of the right basilic and brachial veins  Interventional Radiologist:  Sterling Big, MD  Fluoroscopy time: 12 seconds  Contrast volume: Zero  PROCEDURE/FINDINGS:  Informed consent was obtained from the patient's son following explanation of the procedure, risks, benefits and alternatives. The patient is extremely agitated and could not be capped on the fluoroscopy table safely.  Therefore, 1 mg of IV at of an was administered and after the patient was relaxed she was repositioned on the fluoroscopy table.  A time out was performed.  Maximal barrier sterile technique utilized including caps, mask, sterile gowns, sterile gloves, large sterile drape, hand hygiene, and betadine skin prep. The right upper extremity was interrogated with ultrasound.  The basilic vein and paired brachial veins are patent without evidence of thrombosis.  The vessels are fairly small.  Under direct sonographic guidance, first the basilic vein was accessed.  However, before wire could be advanced through the needle the patient flex her arms forcefully which displace the needle and resulted in an immediate hematoma and resultant venous spasm.  Hemostasis was obtained by gentle manual pressure.  The patient was again, down.  A second attempt was made this time the right brachial vein was punctured under ultrasound guidance. Again, before a wire could be passed through the needle the patient forcefully flexed her arms and displacing the needle several centimeters within the soft tissue.  At this point, hemostasis was held and the procedure was terminated.  IMPRESSION:  Unsuccessful attempted PICC placement secondary to patient agitation and forceful moving of her upper extremities.  In her current state, she is at relatively high risk for nerve or arterial injury.  Recommend reattempting PICC placement after appropriate sedation.  Signed,  Sterling Big, MD Vascular &  Interventional Radiologist [facility]   Original Report Authenticated By: Malachy Moan, M.D.      Medications:     Scheduled Medications: . amiodarone  400 mg Oral Daily  . aspirin EC  81 mg Oral Daily  . docusate sodium  100 mg Oral BID  . escitalopram  10 mg Oral Daily  . feeding supplement  237 mL Oral BID BM  . furosemide  60 mg Intravenous Q8H  . levETIRAcetam  1,000 mg Intravenous Q12H  . levothyroxine  50 mcg Oral QAC breakfast  . metoprolol tartrate  25 mg Oral BID  . pantoprazole  40 mg Oral Daily  . polyethylene glycol  17 g Oral BID  . QUEtiapine  50 mg Oral Daily  . Rivaroxaban  20 mg Oral Q supper  . sodium chloride  3 mL Intravenous Q12H  . sorbitol  30 mL Oral  Once    Infusions:    PRN Medications: sodium chloride, acetaminophen, albuterol, ALPRAZolam, ALPRAZolam, butalbital-acetaminophen-caffeine, morphine injection, ondansetron (ZOFRAN) IV, oxyCODONE, oxyCODONE-acetaminophen, sodium chloride   Assessment:  1. A/C systolic heart failure - ECHO 16% 04/2012  2. Protein C Deficiency  3. A Fib - on Xarelto  4. Seizure disorder- on chronic Keppra  5. H/O DVT  6. H/O CVA - residual right-sided weakness and expressive aphasia.  7. Hypothyroidism  8. Noncompliance  9. NSVT 10. Hyperkalemia    Plan/Discussion:    Volume status much improved although bed weights unchanged. She again refused to talk to me today. I told her that if she refused to comply with medical treatment and follow-up for her HF we needed to deactivate her ICD (I d/w EP nurse and SM.Hribar has never come for device f/u or transmitted remotely). She then became very interactive with me and said she would comply with therapy. I told her that she has said this before but not come through on it. I have told her without adequate f/u she was subject to inappropriate and dangerous shocks from her ICD and we should not leave it on if she won't let us follow it. We have agreed that we will  deactivate her ICD today and if she followed up next week in clinic (and we could resume following her ICD regularly) I would reactivate it.  She and her family have met with case managers and SW and decided to go home (versus SNF). I am concerned about this decision but hopefully she will f/u in HF clinic next week and we can manage more closely.  HF meds on d/c  Amio 200 daily (decreased dose) Lisinopril 5mg  daily Lasix 40 bid Kcl 20 bid Xarelto 20 daily Digoxin 0.125 daily (new)  Stop lopressor and spiro.   F/u HF clinic next week. We will place appt on chart.   Arvilla Meres 01/10/2013 6:16 AM  I interrogated patient's [employer] ICD personally. Implanted in 2008. No VT/VF therapies. Nearing EOL zone. Tachy therapies programmed off.   Daniel [name],MD 7:05 AM  Spoke with patient's son, Feliz Beam, he notified me that Ms. Kang has had her ICD followed routinely in [place]. He requested that the ICD be turned back on, which we did. I reiterated the need for her to have regular HF follow-up. We have made her an appt for the Clinic. I told them that if she no-shows next week she will not be eligible for further HF visits.  Daniel [name],MD 11:39 AM

## 2013-01-10 NOTE — Clinical Social Work Psych Note (Signed)
Clinical Social Work Department CLINICAL SOCIAL WORK PSYCHIATRY SERVICE LINE ASSESSMENT 01/10/2013  Patient:  Elizabeth Love  Account:  [id number]  Admit Date:  01/05/2013  Clinical Social Worker:  Read Drivers  Date/Time:  01/10/2013 03:32 PM Referred by:  RN  Date referred:  01/10/2013 Reason for Referral  Behavioral Health Issues  Competency/Guardianship   Presenting Symptoms/Problems (In the person's/family's own words):   Psych CSW consulted for capacity    Abuse/Neglect/Trauma Comments:   unable to accurately assess    Psychiatric medications:  see current medications listed below   Current Mental Health Hospitalizations/Previous Mental Health History:   unable to accurate assess   Current provider:   unable to accurately assess   Place and Date:   unable to accurately assess   Current Medications:   Elizabeth Love, Elizabeth Love #[id number] (CSN: [id number])  (57 y.o. F) (Adm: 01/05/13)   [id number]        Scheduled Meds Sorted by Name   for Elizabeth Love, Elizabeth Love as of 01/10/13 1533   Legend:    Given Hold Not Given Due Canceled Entry Other Actions    Time Time (Time) Time Time Time-Action      Inactive     Active     Linked    Medications 01/10/13 01/11/13 01/12/13 01/13/13 01/14/13 01/15/13 01/16/13  amiodarone (PACERONE) tablet 400 mg  Dose: 400 mg Freq: Daily Route: PO  Start: 01/06/13 1000   1121       1000       1000       1000       1000       1000       1000        aspirin EC tablet 81 mg  Dose: 81 mg Freq: Daily Route: PO  Start: 01/06/13 1000   1120       1000       1000       1000       1000       1000       1000        docusate sodium (COLACE) capsule 100 mg  Dose: 100 mg Freq: 2 times daily Route: PO  Start: 01/06/13 1000   1120      2200       1000      2200       1000      2200       1000      2200       1000      2200       1000      2200       1000      2200         escitalopram (LEXAPRO) tablet 10 mg  Dose: 10 mg Freq: Daily Route: PO  Start: 01/06/13 1000   1120       1000       1000       1000       1000       1000       1000        feeding supplement (ENSURE COMPLETE) liquid 237 mL  Dose: 237 mL Freq: 2 times daily between meals Route: PO  Start: 01/06/13 1000   1121      1400       1000      1400  1000      1400       1000      1400       1000      1400       1000      1400       1000      1400        furosemide (LASIX) injection 60 mg  Dose: 60 mg Freq: Every 8 hours Route: IV  Start: 01/06/13 2200   Admin Instructions:  For IV administration: Give IV push at 20 - 40 mg/min   0633      1400      2200       0600      1400      2200       0600      1400      2200       0600      1400      2200       0600      1400      2200       0600      1400      2200       0600      1400      2200        levETIRAcetam (KEPPRA) 1,000 mg in sodium chloride 0.9 % 100 mL IVPB  Dose: 1,000 mg Freq: Every 12 hours Route: IV  Start: 01/06/13 1600   0342      1600       0400      1600       0400      1600       0400      1600       0400      1600       0400      1600       0400      1600        levothyroxine (SYNTHROID, LEVOTHROID) tablet 50 mcg  Dose: 50 mcg Freq: Daily before breakfast Route: PO  Start: 01/06/13 0630   Admin Instructions:  Give on an empty stomach, at least 30 minutes before eating.   0633      1120       0630       0630       0630       0630       0630       0630        metoprolol tartrate (LOPRESSOR) tablet 25 mg  Dose: 25 mg Freq: 2 times daily Route: PO  Start: 01/06/13 1000   Admin Instructions:  (BETA BLOCKER)   1120      2200       1000      2200       1000      2200       1000      2200       1000      2200        1000      2200       1000      2200        pantoprazole (PROTONIX) EC tablet 40 mg  Dose: 40 mg Freq: Daily Route: PO  Start: 01/06/13 1000   1120  1000       1000       1000       1000       1000       1000        polyethylene glycol (MIRALAX / GLYCOLAX) packet 17 g  Dose: 17 g Freq: 2 times daily Route: PO  Start: 01/06/13 1000   Admin Instructions:  Dissolve 17 grams in 4 ounces of water. Do NOT mix with Thick-it.   1121      2200       1000      2200       1000      2200       1000      2200       1000      2200       1000      2200       1000      2200        QUEtiapine (SEROQUEL XR) 24 hr tablet 50 mg  Dose: 50 mg Freq: Daily Route: PO  Start: 01/06/13 1000   Admin Instructions:  **DO NOT CRUSH**   1121       1000       1000       1000       1000       1000       1000        Rivaroxaban (XARELTO) tablet 20 mg  Dose: 20 mg Freq: Daily with supper Route: PO  Start: 01/06/13 1700   Admin Instructions:  High risk med: Anticoagulant   1700       1700       1700       1700       1700       1700       1700        sodium chloride 0.9 % injection 3 mL  Dose: 3 mL Freq: Every 12 hours Route: IV  Start: 01/06/13 1000   (1122) [C]      2200       1000      2200       1000      2200       1000      2200       1000      2200       1000      2200       1000      2200        sorbitol 70 % solution 30 mL  Dose: 30 mL Freq:  Once Route: PO  Start: 01/08/13 1100        Continuous Meds Sorted by Name   for Elizabeth Love, Elizabeth Love as of 01/10/13 1533   Legend:    Given Hold Not Given Due Canceled Entry Other Actions    Time Time (Time) Time Time Time-Action      Inactive     Active     Linked    Medications 01/10/13 01/11/13 01/12/13 01/13/13 01/14/13 01/15/13 01/16/13      PRN Meds Sorted by Name   for Elizabeth Love as of  01/10/13 1533   Legend:    Given Hold Not Given Due Canceled Entry Other Actions    Time Time (Time) Time Time Time-Action      Inactive  Active     Linked    Medications 01/10/13 01/11/13 01/12/13 01/13/13 01/14/13 01/15/13 01/16/13  0.9 %  sodium chloride infusion  Dose: 250 mL Freq: As needed Route: IV  PRN Comment: for IV line care  (Saline / Heparin Lock)  Start: 01/06/13 0533   Admin Instructions:  Flush tubing as needed.    acetaminophen (TYLENOL) tablet 650 mg  Dose: 650 mg Freq: Every 4 hours PRN Route: PO  PRN Reason: mild pain  Start: 01/06/13 0533 End: 01/06/13 0538   Admin Instructions:  Maximum dose of acetaminophen in 4000 mg from all sources in 24 hours.    acetaminophen (TYLENOL) tablet 650 mg  Dose: 650 mg Freq: Every 6 hours PRN Route: PO  PRN Reasons: fever,mild pain  Start: 01/06/13 0531   Admin Instructions:  Maximum dose of acetaminophen in 4000 mg from all sources in 24 hours.    albuterol (PROVENTIL) (5 MG/ML) 0.5% nebulizer solution 2.5 mg  Dose: 2.5 mg Freq: Every 2 hours PRN Route: NEBULIZATION  PRN Reasons: wheezing,shortness of breath  Start: 01/06/13 0532    ALPRAZolam (XANAX) tablet 0.5 mg  Dose: 0.5 mg Freq: 2 times daily PRN Route: PO  PRN Reason: anxiety  Start: 01/06/13 1021    ALPRAZolam (XANAX) tablet 0.5 mg  Dose: 0.5 mg Freq: At bedtime PRN Route: PO  PRN Reason: sleep  Start: 01/06/13 0531    butalbital-acetaminophen-caffeine (FIORICET, ESGIC) 50-325-40 MG per tablet 1 tablet  Dose: 1 tablet Freq: Every 4 hours PRN Route: PO  PRN Reason: headache  Start: 01/08/13 1011   Admin Instructions:  325mg  APAP/tab. Do NOT exceed 4000mg /day    morphine 2 MG/ML injection 1 mg  Dose: 1 mg Freq: Every 4 hours PRN Route: IV  PRN Reason: severe pain  Start: 01/07/13 1221   0342      0923        ondansetron (ZOFRAN) injection 4 mg  Dose: 4 mg Freq: Every 6 hours PRN Route: IV  PRN Reason: nausea  Start: 01/06/13 0533     oxyCODONE-acetaminophen (PERCOCET) 10-325 MG per tablet 1 tablet  Dose: 1 tablet Freq: Every 6 hours PRN Route: PO  PRN Reason: severe pain  Start: 01/06/13 0531 End: 01/06/13 0541   Admin Instructions:  Maximum dose of Acetaminophen is 4000 mg per 24 hr from all sour    oxyCODONE-acetaminophen (PERCOCET/ROXICET) 5-325 MG per tablet 1 tablet  Dose: 1 tablet Freq: Every 6 hours PRN Route: PO  PRN Reason: severe pain  Start: 01/06/13 0541   Admin Instructions:  Give with oxycodone 5 mg and use for Percocet 10/325  Maximum dose of acetaminophen in 4000 mg from all sources in 24 hours.    And  oxyCODONE (Oxy IR/ROXICODONE) immediate release tablet 5 mg  Dose: 5 mg Freq: Every 6 hours PRN Route: PO  PRN Reason: severe pain  Start: 01/06/13 0541   Admin Instructions:  Give with oxycodone/APAP 5/325 and use for Percocet 10/325  IMMEDIATE RELEASE - OxyIR    sodium chloride 0.9 % injection 3 mL  Dose: 3 mL Freq: As needed Route: IV  PRN Reason: line care  Start: 01/06/13 0533   Previous Impatient Admission/Date/Reason:   unable to accurately assess   Emotional Health / Current Symptoms     Suicide attempt in the past:   unable to accurately assess   Other harmful behavior:   unable to accurately assess   Psychotic/Dissociative Symptoms  Inability to care for self  Confusion  Other Psychotic/Dissociative Symptoms:   unable to completley accurately assess    Attention/Behavioral Symptoms  Unable to accurately assess   Other Attention / Behavioral Symptoms:   poor eye contact    Cognitive Impairment  Recent Memory Impairment  Unable to accurately assess   Other Cognitive Impairment:   unable to completely and accurately assess due to the pt lack of participation in the assessment    Mood and Adjustment  Confused  Flat    Stress, Anxiety, Trauma, Any Recent Loss/Stressor  None reported   Anxiety (frequency):   unable to accurately assess   Phobia  (specify):   unable to accurately assess   Compulsive behavior (specify):   unable to accurately assess   Obsessive behavior (specify):   unable to accurately assess   Other:   unable to accurately assess    SBIRT completed (please refer for detailed history):  N  Self-reported substance use:   untested   Urinary Drug Screen Completed:  N Alcohol level:   untested     Who is in the home:   pt would not complete assessment   Emergency contact:  unable to assess    Patient's Strengths and Goals (patient's own words):   Pt is in the hospital seeking medical care   Clinical Social Worker's Interpretive Summary:   Psych CSW assessed pt at bedside.  Pt was alert; however was not oriented to person, place, time or situation.  Pt made very poor eye contact and did not participate in the assessment.  Psych CSW found it difficult to assess whether pt was refusing to participate or unable to mentally participate in the assessment.  pt answered each assessment question with "I don't know".  Psych CSW believes pt to be without capacity.  P   Disposition:  Psych Clinical Social Worker signing off  Vickii Penna, [place] [phone]  Clinical Social Work

## 2013-01-10 NOTE — Plan of Care (Signed)
Problem: Phase I Progression Outcomes Goal: Up in chair, BRP Outcome: Progressing Pt up to [facility] with 1 asst

## 2013-01-10 NOTE — Progress Notes (Signed)
Pt refused lab draw this AM. Baron Hamper, RN 01/10/2013

## 2013-01-10 NOTE — Discharge Summary (Signed)
Physician Discharge Summary  Elizabeth Love GNF:[id number] DOB: [birth year] DOA: 01/05/2013  PCP: Estill Cotta, MD  Admit date: 01/05/2013 Discharge date: 01/10/2013  Time spent: 45 minutes   Discharge Diagnoses:   Acute on chronic systolic CHF (congestive heart failure)   Aphasia as late effect of cerebrovascular accident   Atrial fibrillation   Protein C deficiency   DM type 2 (diabetes mellitus, type 2) Constipation  Headaches   Discharge Condition: fair  Diet recommendation: heart helathy   Filed Weights   01/08/13 0559 01/09/13 0500 01/10/13 0551  Weight: 53.4 kg (117 lb 11.6 oz) 53 kg (116 lb 13.5 oz) 53.9 kg (118 lb 13.3 oz)    History of present illness:  Elizabeth Love is a 57 y.o. female who is known to our service, patient has a very complex medical history she has history of nonischemic cardiomyopathy with EF of 10% status post AICD placement in 2007 in [place]. She also has history of noncompliance and behavioral problems thought to be secondary to history of stroke with residual right-sided weakness and aphasia which makes history taking difficult at best. She also has a history of atrial fibrillation for thought to be initially not a good candidate for anticoagulation due to noncompliance. But in November 2003 she developed DVT with subsequent pulmonary embolism and was found to have protein C deficiency and so has had an IVC filter placement done and was started on Xarelto which is still taking.  In the ED her troponin was negative, EKG, unchanged, CXR demonstrated pulmonary edema, cardiomegaly, and have 3+ pitting edema to her legs. She was started on lasix and sent here for admission.      Hospital Course:  1. Acute on Chronic Systolic Heart Failure: Patient was placed on IV lasix 60 mg IV Q 8 hours. Strict I and O. We did hold Lisinopril due to hyperkalemia. Last ECHO 04-2012. New ECHO showed EF of 10%. Heart Failure team followed along for  optimization of treatment . Dr. Gala Romney recommended the following outpt therapy -  Amio 200 daily (decreased dose)  Lisinopril 5mg  daily  Lasix 40 bid  Kcl 20 bid  Xarelto 20 daily  Digoxin 0.125 daily (new  Dr. Gala Romney turned off AICD on 4/26   2. Abdominal Pain from massive constipation : KUB negative for obstruction. Lipase normal. Refusing laxatives but fekt better after she had a BM 3. A.Fib/ Protein C deficiency - continued on Xarelto. Continue with amiodarone. 4. H/o Seizures - continue home keppra 5. H/O stroke with resultant Aphasia and right side weakness. Patient has chronic cognitive deficits which make her unable to fully understand her current end stage condition and to take necessary steps. Family refused SNF placement for the patient. History of DVT 06/2012: RLE DVT, + protein C deficiency, placed on Xarelto. s/p IVC filter  6. History of Headaches, Chronic. No worsening neuro deficit. CT head: no acute finding. fioricet prn   Procedures: None  Consultations:  [facility]   Discharge Exam: Filed Vitals:   01/09/13 0500 01/09/13 1506 01/09/13 2019 01/10/13 0551  BP: 118/77 124/80 108/73 99/64  Pulse: 69 53 77   Temp: 97.7 F (36.5 C) 97.5 F (36.4 C) 97.8 F (36.6 C) 98.7 F (37.1 C)  TempSrc: Oral Oral Oral Axillary  Resp: 18 18 19 18   Height:      Weight: 53 kg (116 lb 13.5 oz)   53.9 kg (118 lb 13.3 oz)  SpO2: 96% 99% 99% 97%  General: alert  Cardiovascular: irreg irreg  Respiratory: clear  Discharge Instructions   Future Appointments Provider Department Dept Phone   01/14/2013 12:00 PM Minda Meo, PA-C [facility] Main Office [place]) [phone]   01/17/2013 8:45 AM [facility] [phone]   02/06/2013 1:45 PM Rollene Rotunda, MD [facility] Main Office [place]) [phone]       Medication List    STOP taking these medications       metoprolol tartrate 25  MG tablet  Commonly known as:  LOPRESSOR     spironolactone 25 MG tablet  Commonly known as:  ALDACTONE      TAKE these medications       acetaminophen 325 MG tablet  Commonly known as:  TYLENOL  Take 650 mg by mouth every 6 (six) hours as needed for pain.     albuterol (2.5 MG/3ML) 0.083% nebulizer solution  Commonly known as:  PROVENTIL  Take 2.5 mg by nebulization every 6 (six) hours as needed. For cough or wheeze     ALPRAZolam 0.5 MG tablet  Commonly known as:  XANAX  Take 1 tablet (0.5 mg total) by mouth at bedtime as needed for sleep. For sleep     amiodarone 400 MG tablet  Commonly known as:  PACERONE  Take 1 tablet (400 mg total) by mouth daily.     aspirin 81 MG EC tablet  Take 1 tablet (81 mg total) by mouth daily. Swallow whole.     butalbital-acetaminophen-caffeine 50-325-40 MG per tablet  Commonly known as:  FIORICET, ESGIC  Take 1 tablet by mouth every 4 (four) hours as needed for headache.     digoxin 0.125 MG tablet  Commonly known as:  LANOXIN  Take 1 tablet (0.125 mg total) by mouth daily.     DSS 100 MG Caps  Take 100 mg by mouth 2 (two) times daily.     escitalopram 10 MG tablet  Commonly known as:  LEXAPRO  Take 10 mg by mouth daily.     feeding supplement Liqd  Take 237 mLs by mouth 2 (two) times daily between meals.     furosemide 40 MG tablet  Commonly known as:  LASIX  Take 1 tablet (40 mg total) by mouth 2 (two) times daily.     levETIRAcetam 500 MG tablet  Commonly known as:  KEPPRA  Take 1,000 mg by mouth 2 (two) times daily.     levothyroxine 50 MCG tablet  Commonly known as:  SYNTHROID, LEVOTHROID  Take 50 mcg by mouth daily.     lisinopril 5 MG tablet  Commonly known as:  PRINIVIL,ZESTRIL  Take 5 mg by mouth daily.     omeprazole 20 MG capsule  Commonly known as:  PRILOSEC  Take 20 mg by mouth 2 (two) times daily.     oxyCODONE-acetaminophen 10-325 MG per tablet  Commonly known as:  PERCOCET  Take 1 tablet by mouth  every 6 (six) hours as needed for pain. Scheduled per pharmacy     polyethylene glycol packet  Commonly known as:  MIRALAX / GLYCOLAX  Take 17 g by mouth 2 (two) times daily.     Potassium Chloride ER 20 MEQ Tbcr  Take 20 mEq by mouth daily.     QUEtiapine 50 MG Tb24  Commonly known as:  SEROQUEL XR  Take 50 mg by mouth daily.     Rivaroxaban 20 MG Tabs  Commonly known as:  Carlena Hurl  Take 1 tablet (20 mg total) by mouth daily with supper.           Follow-up Information   Follow up with Arvilla Meres, MD On 01/17/2013. (8:45 Garage Code 0010)    Contact information:   [address] [place] [address] [phone]        The results of significant diagnostics from this hospitalization (including imaging, microbiology, ancillary and laboratory) are listed below for reference.    Significant Diagnostic Studies: Dg Chest 2 View  01/06/2013  *RADIOLOGY REPORT*  Clinical Data: Chest pain and leg swelling.  Hypertension. Diabetes.  Asthma.  CHEST - 2 VIEW  Comparison: 11/28/2012  Findings: Pacer / AICD device, unchanged in position.  Midline trachea.  Marked enlargement of  cardiopericardial silhouette.  No right-sided pleural effusion.  Probable small left pleural effusion.  No pneumothorax.  Increase in moderate interstitial edema. Suboptimal evaluation of the inferior left hemithorax on the frontal view.  IMPRESSION: Increasing moderate congestive heart failure.  Probable small left pleural effusion.  Marked enlargement of the cardiopericardial silhouette. Cardiomegaly.  Cannot exclude pericardial effusion.   Original Report Authenticated By: Jeronimo Greaves, M.D.    Dg Abd 1 View  01/06/2013  *RADIOLOGY REPORT*  Clinical Data: Generalized abdominal pain.  ABDOMEN - 1 VIEW  Comparison: 08/25/2012 and chest film of earlier in the day.  Findings: 2 supine views.  The first demonstrates an AICD device terminating over the left ventricle.  An IVC filter terminates at  the L2-L3 level.  No gaseous distention of bowel loops.  No gross free intraperitoneal air.  Marked enlargement of cardiopericardial silhouette.  The second image demonstrates gas within the stomach and rectosigmoid region.  No pneumatosis.  Phleboliths in the pelvis. Artifactual linear lucency projecting over the upper abdomen.  IMPRESSION: No acute findings.   Original Report Authenticated By: Jeronimo Greaves, M.D.    Ct Head Wo Contrast  01/07/2013  *RADIOLOGY REPORT*  Clinical Data: Headache.  History of stroke  CT HEAD WITHOUT CONTRAST  Technique:  Contiguous axial images were obtained from the base of the skull through the vertex without contrast.  Comparison: 11/30/2012  Findings: Large area of chronic infarction left MCA territory is unchanged.  No acute infarct.  Negative for hemorrhage or mass. Negative for hydrocephalus.  IMPRESSION: Chronic left MCA infarct.  No interval change from the prior study.   Original Report Authenticated By: Janeece Riggers, M.D.    Ir US Guide Vasc Access Right  01/09/2013  *RADIOLOGY REPORT*  IR ULTRASOUND GUIDED VASCULAR ACCESS RIGHT  Date: 01/08/2013  Clinical History: 47 old female with chronic systolic heart failure, seizure disorder, prior cerebrovascular accident and agitation.  She requires durable venous access and a PICC is requested.  Procedures Performed: 1. Ultrasound-guided puncture of the right basilic and brachial veins  Interventional Radiologist:  Sterling Big, MD  Fluoroscopy time: 12 seconds  Contrast volume: Zero  PROCEDURE/FINDINGS:  Informed consent was obtained from the patient's son following explanation of the procedure, risks, benefits and alternatives. The patient is extremely agitated and could not be capped on the fluoroscopy table safely.  Therefore, 1 mg of IV at of an was administered and after the patient was relaxed she was repositioned on the fluoroscopy table.  A time out was performed.  Maximal barrier sterile technique utilized  including caps, mask, sterile gowns, sterile gloves, large sterile drape, hand hygiene, and betadine skin prep. The right upper extremity was interrogated with ultrasound.  The basilic  vein and paired brachial veins are patent without evidence of thrombosis.  The vessels are fairly small.  Under direct sonographic guidance, first the basilic vein was accessed.  However, before wire could be advanced through the needle the patient flex her arms forcefully which displace the needle and resulted in an immediate hematoma and resultant venous spasm.  Hemostasis was obtained by gentle manual pressure.  The patient was again, down.  A second attempt was made this time the right brachial vein was punctured under ultrasound guidance. Again, before a wire could be passed through the needle the patient forcefully flexed her arms and displacing the needle several centimeters within the soft tissue.  At this point, hemostasis was held and the procedure was terminated.  IMPRESSION:  Unsuccessful attempted PICC placement secondary to patient agitation and forceful moving of her upper extremities.  In her current state, she is at relatively high risk for nerve or arterial injury.  Recommend reattempting PICC placement after appropriate sedation.  Signed,  Sterling Big, MD Vascular & Interventional Radiologist [facility]   Original Report Authenticated By: Malachy Moan, M.D.     Microbiology: No results found for this or any previous visit (from the past 240 hour(s)).   Labs: Basic Metabolic Panel:  Recent Labs Lab 01/06/13 0154 01/06/13 0730 01/06/13 1547 01/07/13 0829 01/08/13 0817 01/09/13 0650  NA 139  --  137 138 139 141  K 3.9  --  4.7 5.8* 4.1 4.0  CL 103  --  105 105 104 105  CO2 24  --  20 21 22 25   GLUCOSE 108*  --  104* 102* 112* 99  BUN 21  --  22 24* 26* 28*  CREATININE 1.10  --  1.22* 1.22* 1.45* 1.41*  CALCIUM 9.7  --  9.1 9.1 9.5 9.6  MG  --  1.8  --   --   --   --     Liver Function Tests: No results found for this basename: AST, ALT, ALKPHOS, BILITOT, PROT, ALBUMIN,  in the last 168 hours  Recent Labs Lab 01/06/13 0935  LIPASE 13   No results found for this basename: AMMONIA,  in the last 168 hours CBC:  Recent Labs Lab 01/06/13 0154 01/07/13 0829 01/08/13 0817  WBC 6.6 5.1 5.5  NEUTROABS 4.3  --   --   HGB 10.1* 8.5* 8.9*  HCT 31.8* 26.8* 27.8*  MCV 79.7 78.4 77.9*  PLT 278 155 229   Cardiac Enzymes:  Recent Labs Lab 01/06/13 0154 01/06/13 0730 01/06/13 1547 01/06/13 2106  TROPONINI <0.30 <0.30 <0.30 <0.30   BNP: BNP (last 3 results)  Recent Labs  11/06/12 0825 11/27/12 1821 01/06/13 0154  PROBNP 12374.0* 9596.0* 10506.0*   CBG:  Recent Labs Lab 01/09/13 0607 01/09/13 1126 01/09/13 1632 01/09/13 2114 01/10/13 0643  GLUCAP 103* 141* 115* 130* 99       Signed:  Kikuye Korenek  [facility] 01/10/2013, 10:24 AM

## 2013-01-10 NOTE — Progress Notes (Signed)
CSW spoke with patient's son Feliz Beam.  He has discussed with his uncle and they feel that patient will be more appropriate to return home than SNF.  They feel that they can manage patient better at home and that she will cooperate more with them than with nursing.  He will notify patient's CAP aide to resume services;  CSW notified Ronnie Derby- RNCM who will arrange Home Health.  Patient refused HH with RNCM but her son insists that she "does not know what she is saying" and insists that [facility] be resumed. [facility] has had [facility] prior to this admission.  RNCM will complete Home Health follow-up.  CSW will sign off.  [name]. [place]  [phone]

## 2013-01-10 NOTE — Progress Notes (Signed)
Elizabeth Love to be D/C'd Home per MD order.  Discussed with the patient and all questions fully answered.    Medication List    STOP taking these medications       metoprolol tartrate 25 MG tablet  Commonly known as:  LOPRESSOR     spironolactone 25 MG tablet  Commonly known as:  ALDACTONE      TAKE these medications       acetaminophen 325 MG tablet  Commonly known as:  TYLENOL  Take 650 mg by mouth every 6 (six) hours as needed for pain.     albuterol (2.5 MG/3ML) 0.083% nebulizer solution  Commonly known as:  PROVENTIL  Take 2.5 mg by nebulization every 6 (six) hours as needed. For cough or wheeze     ALPRAZolam 0.5 MG tablet  Commonly known as:  XANAX  Take 1 tablet (0.5 mg total) by mouth at bedtime as needed for sleep. For sleep     amiodarone 400 MG tablet  Commonly known as:  PACERONE  Take 1 tablet (400 mg total) by mouth daily.     aspirin 81 MG EC tablet  Take 1 tablet (81 mg total) by mouth daily. Swallow whole.     butalbital-acetaminophen-caffeine 50-325-40 MG per tablet  Commonly known as:  FIORICET, ESGIC  Take 1 tablet by mouth every 4 (four) hours as needed for headache.     digoxin 0.125 MG tablet  Commonly known as:  LANOXIN  Take 1 tablet (0.125 mg total) by mouth daily.     DSS 100 MG Caps  Take 100 mg by mouth 2 (two) times daily.     escitalopram 10 MG tablet  Commonly known as:  LEXAPRO  Take 10 mg by mouth daily.     feeding supplement Liqd  Take 237 mLs by mouth 2 (two) times daily between meals.     furosemide 40 MG tablet  Commonly known as:  LASIX  Take 1 tablet (40 mg total) by mouth 2 (two) times daily.     levETIRAcetam 500 MG tablet  Commonly known as:  KEPPRA  Take 1,000 mg by mouth 2 (two) times daily.     levothyroxine 50 MCG tablet  Commonly known as:  SYNTHROID, LEVOTHROID  Take 50 mcg by mouth daily.     lisinopril 5 MG tablet  Commonly known as:  PRINIVIL,ZESTRIL  Take 5 mg by mouth daily.     omeprazole  20 MG capsule  Commonly known as:  PRILOSEC  Take 20 mg by mouth 2 (two) times daily.     oxyCODONE-acetaminophen 10-325 MG per tablet  Commonly known as:  PERCOCET  Take 1 tablet by mouth every 6 (six) hours as needed for pain. Scheduled per pharmacy     polyethylene glycol packet  Commonly known as:  MIRALAX / GLYCOLAX  Take 17 g by mouth 2 (two) times daily.     Potassium Chloride ER 20 MEQ Tbcr  Take 20 mEq by mouth daily.     QUEtiapine 50 MG Tb24  Commonly known as:  SEROQUEL XR  Take 50 mg by mouth daily.     Rivaroxaban 20 MG Tabs  Commonly known as:  XARELTO  Take 1 tablet (20 mg total) by mouth daily with supper.        VVS, Skin clean, dry and intact without evidence of skin break down, no evidence of skin tears noted. IV catheter discontinued intact. Site without signs and symptoms of complications. Dressing and  pressure applied.  An After Visit Summary was printed and given to the patient. Patient escorted via WC, and D/C home via private auto.  Milta Deiters 01/10/2013 4:34 PM

## 2013-01-10 NOTE — Progress Notes (Signed)
01/10/13@ 1000.Marland KitchenMarland KitchenOletta Cohn, RN, BSN, [place] [phone] Spoke with pt son regarding discharge planning.  Relayed to son pt's refusal of HH services.  Son states that pt has had services before and does not realize what she is refusing.  Son chose to restart RN servies with [facility]).  Debbie of [facility] notified.

## 2013-01-12 DIAGNOSIS — I5022 Chronic systolic (congestive) heart failure: Secondary | ICD-10-CM

## 2013-01-12 DIAGNOSIS — I509 Heart failure, unspecified: Secondary | ICD-10-CM

## 2013-01-14 ENCOUNTER — Encounter: Payer: Medicare Other | Admitting: Cardiology

## 2013-01-17 ENCOUNTER — Ambulatory Visit (HOSPITAL_COMMUNITY)
Admit: 2013-01-17 | Discharge: 2013-01-17 | Disposition: A | Discharge status: Home / Self Care | Payer: Medicare Other | Source: Ambulatory Visit | Attending: Internal Medicine | Admitting: Internal Medicine

## 2013-01-17 ENCOUNTER — Encounter (HOSPITAL_COMMUNITY): Payer: Self-pay

## 2013-01-17 VITALS — BP 116/74 | HR 70 | Wt 116.4 lb

## 2013-01-17 DIAGNOSIS — I5022 Chronic systolic (congestive) heart failure: Secondary | ICD-10-CM

## 2013-01-17 DIAGNOSIS — I10 Essential (primary) hypertension: Secondary | ICD-10-CM | POA: Insufficient documentation

## 2013-01-17 DIAGNOSIS — E039 Hypothyroidism, unspecified: Secondary | ICD-10-CM | POA: Insufficient documentation

## 2013-01-17 DIAGNOSIS — G40909 Epilepsy, unspecified, not intractable, without status epilepticus: Secondary | ICD-10-CM | POA: Insufficient documentation

## 2013-01-17 DIAGNOSIS — I4891 Unspecified atrial fibrillation: Secondary | ICD-10-CM | POA: Insufficient documentation

## 2013-01-17 DIAGNOSIS — I5023 Acute on chronic systolic (congestive) heart failure: Secondary | ICD-10-CM | POA: Insufficient documentation

## 2013-01-17 LAB — CBC WITH DIFFERENTIAL/PLATELET
Basophils Absolute: 0 10*3/uL (ref 0.0–0.1)
Eosinophils Absolute: 0.1 10*3/uL (ref 0.0–0.7)
Lymphs Abs: 1.6 10*3/uL (ref 0.7–4.0)
MCV: 79.7 fL (ref 78.0–100.0)
Monocytes Relative: 9 % (ref 3–12)
Platelets: 278 10*3/uL (ref 150–400)
RDW: 18.6 % — ABNORMAL HIGH (ref 11.5–15.5)
WBC: 6.6 10*3/uL (ref 4.0–10.5)

## 2013-01-17 LAB — BASIC METABOLIC PANEL
BUN: 15 mg/dL (ref 6–23)
CO2: 21 mEq/L (ref 19–32)
Calcium: 8.9 mg/dL (ref 8.4–10.5)
Chloride: 99 mEq/L (ref 96–112)
Creatinine, Ser: 0.8 mg/dL (ref 0.50–1.10)
GFR calc Af Amer: >90 mL/min (ref >90)
GFR calc non Af Amer: 82 mL/min — ABNORMAL LOW (ref >90)
Glucose, Bld: 107 mg/dL — ABNORMAL HIGH (ref 70–99)
Potassium: 4.2 mEq/L (ref 3.5–5.1)
Sodium: 136 mEq/L (ref 135–145)

## 2013-01-17 MED ORDER — METOLAZONE 2.5 MG PO TABS: 2.5000 mg | ORAL_TABLET | ORAL | Status: DC | PRN

## 2013-01-17 NOTE — Progress Notes (Signed)
Patient ID: Elizabeth Love, female   DOB: Aug 11, 1956, 57 y.o.   MRN: [id number]  Weight Range 118  Baseline proBNP     HPI: Elizabeth Love is 57 year old with a very complicated PMH of chronic systolic heart failure due to NICM (EF 10% 04/2012), status post ICD implantation (placed [facility] 2007 followed in [place]), hypothyroidism, seizure disorder, HTN, DM2. 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 A fib (on Xarelto), RLE DVT, S/P IVC filter, and protein C deficiency.   Has had multiple admissions for various problems.   Admitted to [facility] 01/06/13 with volume overload in setting of medication noncompliance.  ECHO EF 10%. She was started on IV lasix. While admitted she frequently refused to talk and refused medications and treatments.  Discharge weight 118 pounds.   She returns for post hospital follow up with her son and nurse aide. Much more interactive. Denies SOB/PND/Orthopnea. Ambulates to bathroom with a walker. She is taking all  medications except potassium.  Weight at home 114 pounds. Eating high salt food such as pickles, hot dogs, potato chips and macaroni. She has a nurse aide 5 hours a day 7 days a week.      ROS: All systems negative except as listed in HPI, PMH and Problem List.  Past Medical History  Diagnosis Date  . Seizures   . Hypertension   . Stroke     2007 - R sided weakness and expressive aphasia with h/o behavioral problems related to this  . Asthma   . Diabetes mellitus   . 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 Chronic systolic CHF (congestive heart failure)     a. s/p AICD ~2007 ([employer]) - previous care [facility]. No known hx CAD. b. EF 10% by echo 04/2012.  Marland Kitchen Atrial fibrillation     04/2012 - not felt to be an anticoag candidate long term at that time, but later had DVT so was on placed on Xarelto  . Coagulopathy     "Protein C deficiency"  . History of DVT (deep vein thrombosis)     a. 06/2012: RLE DVT, + protein C deficiency, placed on  Xarelto. s/p IVC filter  . Noncompliance     Due to mental status, family has had to coax her to take medicines  . Hypothyroidism   . VT (ventricular tachycardia)     a. h/o ICD ~2007-2008. b. Adm 08/2012 with UTI, had VT/ICD shock x 5 (hypokalemic)  . Protein C deficiency     Current Outpatient Prescriptions  Medication Sig Dispense Refill  . acetaminophen (TYLENOL) 325 MG tablet Take 650 mg by mouth every 6 (six) hours as needed for pain.      Marland Kitchen albuterol (PROVENTIL) (2.5 MG/3ML) 0.083% nebulizer solution Take 2.5 mg by nebulization every 6 (six) hours as needed. For cough or wheeze      . ALPRAZolam (XANAX) 0.5 MG tablet Take 1 tablet (0.5 mg total) by mouth at bedtime as needed for sleep. For sleep  30 tablet  0  . amiodarone (PACERONE) 400 MG tablet Take 200 mg by mouth daily.      Marland Kitchen aspirin 81 MG EC tablet Take 1 tablet (81 mg total) by mouth daily. Swallow whole.  30 tablet  12  . butalbital-acetaminophen-caffeine (FIORICET, ESGIC) 50-325-40 MG per tablet Take 1 tablet by mouth every 4 (four) hours as needed for headache.  14 tablet  0  . digoxin (LANOXIN) 0.125 MG tablet Take 1  tablet (0.125 mg total) by mouth daily.  30 tablet  0  . docusate sodium 100 MG CAPS Take 100 mg by mouth 2 (two) times daily.  10 capsule    . feeding supplement (ENSURE COMPLETE) LIQD Take 237 mLs by mouth 2 (two) times daily between meals.      . furosemide (LASIX) 40 MG tablet Take 1 tablet (40 mg total) by mouth 2 (two) times daily.  60 tablet  0  . levETIRAcetam (KEPPRA) 500 MG tablet Take 1,000 mg by mouth 2 (two) times daily.      Marland Kitchen levothyroxine (SYNTHROID, LEVOTHROID) 50 MCG tablet Take 50 mcg by mouth daily.      Marland Kitchen lisinopril (PRINIVIL,ZESTRIL) 5 MG tablet Take 5 mg by mouth daily.      . metoprolol succinate (TOPROL-XL) 25 MG 24 hr tablet Take 25 mg by mouth 2 (two) times daily.      Marland Kitchen omeprazole (PRILOSEC) 20 MG capsule Take 20 mg by mouth 2 (two) times daily.      Marland Kitchen oxyCODONE-acetaminophen  (PERCOCET) 10-325 MG per tablet Take 1 tablet by mouth every 6 (six) hours as needed for pain. Scheduled per pharmacy  30 tablet  0  . polyethylene glycol (MIRALAX / GLYCOLAX) packet Take 17 g by mouth 2 (two) times daily.  14 each    . potassium chloride SA (K-DUR,KLOR-CON) 20 MEQ tablet Take 20 mEq by mouth 2 (two) times daily.      . Rivaroxaban (XARELTO) 20 MG TABS Take 1 tablet (20 mg total) by mouth daily with supper.  30 tablet  0  . spironolactone (ALDACTONE) 25 MG tablet Take 25 mg by mouth daily.       No current facility-administered medications for this encounter.     PHYSICAL EXAM: Filed Vitals:   01/17/13 0911  BP: 116/74  Pulse: 70  Weight: 116 lb 6.4 oz (52.799 kg)  SpO2: 100%    General:  Interactive. Some expressive aphasia. No resp difficulty Sitting in wheel chair. Son and Nurse Adie present HEENT: normal Neck: supple. JVP 6-7. Carotids 2+ bilaterally; no bruits. No lymphadenopathy or thryomegaly appreciated. Cor: PMI normal. Regular rate & rhythm. No rubs, gallops or murmurs. Lungs: clear Abdomen: soft, nontender, nondistended. No hepatosplenomegaly. No bruits or masses. Good bowel sounds. Extremities: no cyanosis, clubbing, rash,  R and LLE 1+ dema Neuro: alert interactive. R sided weakness. + expressive aphasia

## 2013-01-17 NOTE — Patient Instructions (Addendum)
Follow up in 2 weeks  Take a Metolazone 2.5 as needed for lower extremity edema  Do the following things EVERYDAY: 1) Weigh yourself in the morning before breakfast. Write it down and keep it in a log. 2) Take your medicines as prescribed 3) Eat low salt foods-Limit salt (sodium) to 2000 mg per day.  4) Stay as active as you can everyday 5) Limit all fluids for the day to less than 2 liters

## 2013-01-17 NOTE — Assessment & Plan Note (Addendum)
She returned for post hospital follow up with her son and nurse aide. Volume status mildly elevated due to dietary noncompliance. Instructed to continue lasix  40 mg bid and add metolazone 2.5 as needed for lower extremity edema. She is eating high salt foods such such pickles, macaroni, and potatoe chips. Provided lengthy education to son and nurse aide regarding  low salt diet, limiting fluid intake, and medication compliance. Check BMET. Follow up in 2 weeks .   Patient seen and examined with Tonye Becket, NP. We discussed all aspects of the encounter. I agree with the assessment and plan as stated above. She is surprisingly much more interactive. Doing better than I though she would. Seems to be taking most of her medications. Long talk about need for dietary compliance and use of sliding scale diuretic. We gave her several metolazone tablets in clinic to use as needed and explained need to respond to fluid increases quickly to avoid gut edema. Gave them clinic number to call as needed. Will see back in 2 weeks. Hopefully can begin to titrate meds at next visit.

## 2013-01-23 ENCOUNTER — Encounter: Payer: Self-pay | Admitting: Internal Medicine

## 2013-01-24 ENCOUNTER — Encounter (HOSPITAL_COMMUNITY): Payer: Medicare Other

## 2013-01-31 ENCOUNTER — Ambulatory Visit (HOSPITAL_COMMUNITY)
Admission: RE | Admit: 2013-01-31 | Discharge: 2013-01-31 | Disposition: A | Discharge status: Home / Self Care | Payer: Medicare Other | Source: Ambulatory Visit | Attending: Internal Medicine | Admitting: Internal Medicine

## 2013-01-31 VITALS — BP 130/70 | HR 60 | Wt 110.0 lb

## 2013-01-31 DIAGNOSIS — D6859 Other primary thrombophilia: Secondary | ICD-10-CM | POA: Insufficient documentation

## 2013-01-31 DIAGNOSIS — I69959 Hemiplegia and hemiparesis following unspecified cerebrovascular disease affecting unspecified side: Secondary | ICD-10-CM | POA: Insufficient documentation

## 2013-01-31 DIAGNOSIS — E039 Hypothyroidism, unspecified: Secondary | ICD-10-CM | POA: Insufficient documentation

## 2013-01-31 DIAGNOSIS — I4891 Unspecified atrial fibrillation: Secondary | ICD-10-CM | POA: Insufficient documentation

## 2013-01-31 DIAGNOSIS — I10 Essential (primary) hypertension: Secondary | ICD-10-CM | POA: Insufficient documentation

## 2013-01-31 DIAGNOSIS — I5022 Chronic systolic (congestive) heart failure: Secondary | ICD-10-CM | POA: Insufficient documentation

## 2013-01-31 DIAGNOSIS — E119 Type 2 diabetes mellitus without complications: Secondary | ICD-10-CM | POA: Insufficient documentation

## 2013-01-31 MED ORDER — LISINOPRIL 10 MG PO TABS: 10.0000 mg | ORAL_TABLET | Freq: Every day | ORAL | Status: DC

## 2013-01-31 NOTE — Assessment & Plan Note (Signed)
NYHA II. Volume status improved. Continue current diuretic regimen. Reviewed most recent BMET from May 2. Increase lisinopril to 10 mg daily. Congratulated her on daily weights and low salt food choices. Follow up in 3-4 weeks and continue to titrate medications as she tolerates.

## 2013-01-31 NOTE — Patient Instructions (Addendum)
Follow up in 3-4 weeks  Take Lisinopril 10 mg daily  Do the following things EVERYDAY: 1) Weigh yourself in the morning before breakfast. Write it down and keep it in a log. 2) Take your medicines as prescribed 3) Eat low salt foods-Limit salt (sodium) to 2000 mg per day.  4) Stay as active as you can everyday 5) Limit all fluids for the day to less than 2 liters

## 2013-01-31 NOTE — Progress Notes (Signed)
Patient ID: Elizabeth Love, female   DOB: 1956-08-02, 57 y.o.   MRN: [id number]   Weight Range 118  Baseline proBNP     HPI: Elizabeth Love is 57 year old with a very complicated PMH of chronic systolic heart failure due to NICM (EF 10% 04/2012), status post ICD implantation (placed [facility] 2007 followed in [place]), hypothyroidism, seizure disorder, HTN, DM2. 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 A fib (on Xarelto), RLE DVT, S/P IVC filter, and protein C deficiency.   Has had multiple admissions for various problems.   Admitted to [facility] 01/06/13 with volume overload in setting of medication noncompliance.  ECHO EF 10%. She was started on IV lasix. While admitted she frequently refused to talk and refused medications and treatments.  Discharge weight 118 pounds.   She returns for post hospital follow up with her nurse aide. Denies SOB/PND/Orthopnea. Weight at home 107-109 pounds. SBP at home 110-120. She has had one metolazone over the last 2 weeks.   Ambulates to bathroom with a walker. She is now  following low salt diet.  She has a nurse aide 5 hours a day 7 days a week.  Still not taking taking potassium daily.     ROS: All systems negative except as listed in HPI, PMH and Problem List.  Past Medical History  Diagnosis Date  . Seizures   . Hypertension   . Stroke     2007 - R sided weakness and expressive aphasia with h/o behavioral problems related to this  . Asthma   . Diabetes mellitus   . 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 Chronic systolic CHF (congestive heart failure)     a. s/p AICD ~2007 ([employer]) - previous care [place]. No known hx CAD. b. EF 10% by echo 04/2012.  Marland Kitchen Atrial fibrillation     04/2012 - not felt to be an anticoag candidate long term at that time, but later had DVT so was on placed on Xarelto  . Coagulopathy     "Protein C deficiency"  . History of DVT (deep vein thrombosis)     a. 06/2012: RLE DVT, + protein C deficiency, placed on  Xarelto. s/p IVC filter  . Noncompliance     Due to mental status, family has had to coax her to take medicines  . Hypothyroidism   . VT (ventricular tachycardia)     a. h/o ICD ~2007-2008. b. Adm 08/2012 with UTI, had VT/ICD shock x 5 (hypokalemic)  . Protein C deficiency     Current Outpatient Prescriptions  Medication Sig Dispense Refill  . acetaminophen (TYLENOL) 325 MG tablet Take 650 mg by mouth every 6 (six) hours as needed for pain.      Marland Kitchen albuterol (PROVENTIL) (2.5 MG/3ML) 0.083% nebulizer solution Take 2.5 mg by nebulization every 6 (six) hours as needed. For cough or wheeze      . ALPRAZolam (XANAX) 0.5 MG tablet Take 1 tablet (0.5 mg total) by mouth at bedtime as needed for sleep. For sleep  30 tablet  0  . amiodarone (PACERONE) 400 MG tablet Take 200 mg by mouth daily.      Marland Kitchen aspirin 81 MG EC tablet Take 1 tablet (81 mg total) by mouth daily. Swallow whole.  30 tablet  12  . butalbital-acetaminophen-caffeine (FIORICET, ESGIC) 50-325-40 MG per tablet Take 1 tablet by mouth every 4 (four) hours as needed for headache.  14 tablet  0  . docusate sodium  100 MG CAPS Take 100 mg by mouth 2 (two) times daily.  10 capsule    . feeding supplement (ENSURE COMPLETE) LIQD Take 237 mLs by mouth 2 (two) times daily between meals.      . furosemide (LASIX) 40 MG tablet Take 1 tablet (40 mg total) by mouth 2 (two) times daily.  60 tablet  0  . levETIRAcetam (KEPPRA) 500 MG tablet Take 1,000 mg by mouth 2 (two) times daily.      Marland Kitchen levothyroxine (SYNTHROID, LEVOTHROID) 50 MCG tablet Take 50 mcg by mouth daily.      Marland Kitchen lisinopril (PRINIVIL,ZESTRIL) 5 MG tablet Take 5 mg by mouth daily.      . metolazone (ZAROXOLYN) 2.5 MG tablet Take 1 tablet (2.5 mg total) by mouth as needed.  10 tablet  3  . metoprolol succinate (TOPROL-XL) 25 MG 24 hr tablet Take 25 mg by mouth 2 (two) times daily.      Marland Kitchen omeprazole (PRILOSEC) 20 MG capsule Take 20 mg by mouth 2 (two) times daily.      Marland Kitchen oxyCODONE-acetaminophen  (PERCOCET) 10-325 MG per tablet Take 1 tablet by mouth every 6 (six) hours as needed for pain. Scheduled per pharmacy  30 tablet  0  . polyethylene glycol (MIRALAX / GLYCOLAX) packet Take 17 g by mouth 2 (two) times daily.  14 each    . potassium chloride SA (K-DUR,KLOR-CON) 20 MEQ tablet Take 20 mEq by mouth 2 (two) times daily.      . Rivaroxaban (XARELTO) 20 MG TABS Take 1 tablet (20 mg total) by mouth daily with supper.  30 tablet  0  . spironolactone (ALDACTONE) 25 MG tablet Take 25 mg by mouth daily.      . digoxin (LANOXIN) 0.125 MG tablet Take 1 tablet (0.125 mg total) by mouth daily.  30 tablet  0   No current facility-administered medications for this encounter.     PHYSICAL EXAM: Filed Vitals:   01/31/13 1124  BP: 130/70  Pulse: 60  Weight: 110 lb (49.896 kg)  SpO2: 94%    General:  Interactive. Some expressive aphasia. No resp difficulty Sitting in wheel chair. Nurse Adie present HEENT: normal Neck: supple. JVP 6-7. Carotids 2+ bilaterally; no bruits. No lymphadenopathy or thryomegaly appreciated. Cor: PMI normal. Regular rate & rhythm. No rubs, gallops or murmurs. Lungs: clear Abdomen: soft, nontender, nondistended. No hepatosplenomegaly. No bruits or masses. Good bowel sounds. Extremities: no cyanosis, clubbing, rash,  edema Neuro: alert interactive. R sided weakness. + expressive aphasia

## 2013-02-06 ENCOUNTER — Ambulatory Visit: Payer: Medicare Other | Admitting: Cardiology

## 2013-02-26 ENCOUNTER — Encounter (HOSPITAL_COMMUNITY): Payer: Self-pay

## 2013-02-26 ENCOUNTER — Ambulatory Visit (HOSPITAL_COMMUNITY)
Admission: RE | Admit: 2013-02-26 | Discharge: 2013-02-26 | Disposition: A | Discharge status: Home / Self Care | Payer: Medicare Other | Source: Ambulatory Visit | Attending: Internal Medicine | Admitting: Internal Medicine

## 2013-02-26 VITALS — BP 110/68 | HR 66 | Wt 108.8 lb

## 2013-02-26 DIAGNOSIS — I5022 Chronic systolic (congestive) heart failure: Secondary | ICD-10-CM | POA: Insufficient documentation

## 2013-02-26 DIAGNOSIS — I10 Essential (primary) hypertension: Secondary | ICD-10-CM | POA: Insufficient documentation

## 2013-02-26 DIAGNOSIS — I4891 Unspecified atrial fibrillation: Secondary | ICD-10-CM

## 2013-02-26 MED ORDER — RIVAROXABAN 20 MG PO TABS: 20.0000 mg | ORAL_TABLET | Freq: Every day | ORAL | Status: DC

## 2013-02-26 NOTE — Assessment & Plan Note (Signed)
Patient seen and examined with Tonye Becket, NP. We discussed all aspects of the encounter. I agree with the assessment as stated above. My thoughts below.   She is doing great. Volume status looks very good. Congratulated her and her son on their efforts. Will continue current meds now. Will try and titrate metoprolol at next visit if tolerated.

## 2013-02-26 NOTE — Progress Notes (Signed)
Patient ID: CORRY STORIE, female   DOB: 08-02-1956, 57 y.o.   MRN: [id number]   Weight Range 108-110  Baseline proBNP     HPI: Mrs Girardot is 57 year old with a very complicated PMH of chronic systolic heart failure due to NICM (EF 10% 04/2012), status post ICD implantation (placed [facility] 2007 followed in [place]), hypothyroidism, seizure disorder, HTN, DM2. 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 A fib (on Xarelto), RLE DVT, S/P IVC filter, and protein C deficiency.   Has had multiple admissions for various problems.   Admitted to [facility] 01/06/13 with volume overload in setting of medication noncompliance.  ECHO EF 10%. She was started on IV lasix. While admitted she frequently refused to talk and refused medications and treatments.  Discharge weight 118 pounds.   She returns for follow up with her son.  SOB with exertion. Denies PND/Orthopnea. Weight at home 108 pounds. (down 10 pounds from d/c). Very stable. She has not required any extra Metolazone. Ambulates to bathroom with a walker. Trying to follow low salt diet. She has a nurse aide 5 hours a day 7 days a week.  She has been out of Xarelto for the past 3 days.     ROS: All systems negative except as listed in HPI, PMH and Problem List.  Past Medical History  Diagnosis Date  . Seizures   . Hypertension   . Stroke     2007 - R sided weakness and expressive aphasia with h/o behavioral problems related to this  . Asthma   . Diabetes mellitus   . 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 Chronic systolic CHF (congestive heart failure)     a. s/p AICD ~2007 ([employer]. [employer]) - previous care [place]. No known hx CAD. b. EF 10% by echo 04/2012.  Marland Kitchen Atrial fibrillation     04/2012 - not felt to be an anticoag candidate long term at that time, but later had DVT so was on placed on Xarelto  . Coagulopathy     "Protein C deficiency"  . History of DVT (deep vein thrombosis)     a. 06/2012: RLE DVT, + protein C deficiency, placed  on Xarelto. s/p IVC filter  . Noncompliance     Due to mental status, family has had to coax her to take medicines  . Hypothyroidism   . VT (ventricular tachycardia)     a. h/o ICD ~2007-2008. b. Adm 08/2012 with UTI, had VT/ICD shock x 5 (hypokalemic)  . Protein C deficiency     Current Outpatient Prescriptions  Medication Sig Dispense Refill  . acetaminophen (TYLENOL) 325 MG tablet Take 650 mg by mouth every 6 (six) hours as needed for pain.      Marland Kitchen albuterol (PROVENTIL) (2.5 MG/3ML) 0.083% nebulizer solution Take 2.5 mg by nebulization every 6 (six) hours as needed. For cough or wheeze      . ALPRAZolam (XANAX) 0.5 MG tablet Take 1 tablet (0.5 mg total) by mouth at bedtime as needed for sleep. For sleep  30 tablet  0  . amiodarone (PACERONE) 400 MG tablet Take 200 mg by mouth daily.      Marland Kitchen aspirin 81 MG EC tablet Take 1 tablet (81 mg total) by mouth daily. Swallow whole.  30 tablet  12  . butalbital-acetaminophen-caffeine (FIORICET, ESGIC) 50-325-40 MG per tablet Take 1 tablet by mouth every 4 (four) hours as needed for headache.  14 tablet  0  . digoxin (  LANOXIN) 0.125 MG tablet Take 1 tablet (0.125 mg total) by mouth daily.  30 tablet  0  . docusate sodium 100 MG CAPS Take 100 mg by mouth 2 (two) times daily.  10 capsule    . feeding supplement (ENSURE COMPLETE) LIQD Take 237 mLs by mouth 2 (two) times daily between meals.      . furosemide (LASIX) 40 MG tablet Take 1 tablet (40 mg total) by mouth 2 (two) times daily.  60 tablet  0  . levETIRAcetam (KEPPRA) 500 MG tablet Take 1,000 mg by mouth 2 (two) times daily.      Marland Kitchen levothyroxine (SYNTHROID, LEVOTHROID) 50 MCG tablet Take 50 mcg by mouth daily.      Marland Kitchen lisinopril (PRINIVIL,ZESTRIL) 10 MG tablet Take 1 tablet (10 mg total) by mouth daily.  30 tablet  3  . metolazone (ZAROXOLYN) 2.5 MG tablet Take 1 tablet (2.5 mg total) by mouth as needed.  10 tablet  3  . metoprolol succinate (TOPROL-XL) 25 MG 24 hr tablet Take 25 mg by mouth 2 (two)  times daily.      Marland Kitchen omeprazole (PRILOSEC) 20 MG capsule Take 20 mg by mouth 2 (two) times daily.      Marland Kitchen oxyCODONE-acetaminophen (PERCOCET) 10-325 MG per tablet Take 1 tablet by mouth every 6 (six) hours as needed for pain. Scheduled per pharmacy  30 tablet  0  . polyethylene glycol (MIRALAX / GLYCOLAX) packet Take 17 g by mouth 2 (two) times daily.  14 each    . potassium chloride SA (K-DUR,KLOR-CON) 20 MEQ tablet Take 20 mEq by mouth 2 (two) times daily.      . Rivaroxaban (XARELTO) 20 MG TABS Take 1 tablet (20 mg total) by mouth daily with supper.  30 tablet  0  . spironolactone (ALDACTONE) 25 MG tablet Take 25 mg by mouth daily.       No current facility-administered medications for this encounter.     PHYSICAL EXAM: Filed Vitals:   02/26/13 0947  BP: 110/68  Pulse: 66  Weight: 108 lb 12.8 oz (49.351 kg)  SpO2: 97%    General:  Interactive. Some expressive aphasia. No resp difficulty Sitting in wheel chair. Nurse Adie present HEENT: normal Neck: supple. JVP 6-7. Carotids 2+ bilaterally; no bruits. No lymphadenopathy or thryomegaly appreciated. Cor: PMI normal. Regular rate & rhythm. No rubs, gallops or murmurs. Lungs: clear Abdomen: soft, nontender, nondistended. No hepatosplenomegaly. No bruits or masses. Good bowel sounds. Extremities: no cyanosis, clubbing, rash,  edema Neuro: alert interactive. R sided weakness. + expressive aphasia

## 2013-02-26 NOTE — Patient Instructions (Addendum)
Follow up in 2 months  Do the following things EVERYDAY: 1) Weigh yourself in the morning before breakfast. Write it down and keep it in a log. 2) Take your medicines as prescribed 3) Eat low salt foods-Limit salt (sodium) to 2000 mg per day.  4) Stay as active as you can everyday 5) Limit all fluids for the day to less than 2 liters 

## 2013-02-26 NOTE — Assessment & Plan Note (Signed)
Maintaining NSR on amio. Will continue. Restart Xarelto.

## 2013-03-04 ENCOUNTER — Encounter: Payer: Self-pay | Admitting: Cardiology

## 2013-03-05 ENCOUNTER — Other Ambulatory Visit (HOSPITAL_COMMUNITY): Payer: Self-pay | Admitting: Adult Health

## 2013-03-09 ENCOUNTER — Other Ambulatory Visit (HOSPITAL_COMMUNITY): Payer: Self-pay | Admitting: Adult Health

## 2013-03-10 ENCOUNTER — Other Ambulatory Visit (HOSPITAL_COMMUNITY): Payer: Self-pay | Admitting: *Deleted

## 2013-03-10 MED ORDER — SPIRONOLACTONE 25 MG PO TABS: 25.0000 mg | ORAL_TABLET | Freq: Every day | ORAL | Status: DC

## 2013-03-14 ENCOUNTER — Encounter (HOSPITAL_BASED_OUTPATIENT_CLINIC_OR_DEPARTMENT_OTHER): Payer: Self-pay | Admitting: *Deleted

## 2013-03-14 ENCOUNTER — Emergency Department (HOSPITAL_BASED_OUTPATIENT_CLINIC_OR_DEPARTMENT_OTHER)
Admission: EM | Admit: 2013-03-14 | Discharge: 2013-03-14 | Disposition: A | Discharge status: Home / Self Care | Payer: Medicare Other | Attending: Emergency Medicine | Admitting: Emergency Medicine

## 2013-03-14 DIAGNOSIS — I5022 Chronic systolic (congestive) heart failure: Secondary | ICD-10-CM | POA: Insufficient documentation

## 2013-03-14 DIAGNOSIS — Z8673 Personal history of transient ischemic attack (TIA), and cerebral infarction without residual deficits: Secondary | ICD-10-CM | POA: Insufficient documentation

## 2013-03-14 DIAGNOSIS — Z862 Personal history of diseases of the blood and blood-forming organs and certain disorders involving the immune mechanism: Secondary | ICD-10-CM | POA: Insufficient documentation

## 2013-03-14 DIAGNOSIS — Z8679 Personal history of other diseases of the circulatory system: Secondary | ICD-10-CM | POA: Insufficient documentation

## 2013-03-14 DIAGNOSIS — Z87448 Personal history of other diseases of urinary system: Secondary | ICD-10-CM | POA: Insufficient documentation

## 2013-03-14 DIAGNOSIS — E119 Type 2 diabetes mellitus without complications: Secondary | ICD-10-CM | POA: Insufficient documentation

## 2013-03-14 DIAGNOSIS — Z880 Allergy status to penicillin: Secondary | ICD-10-CM | POA: Insufficient documentation

## 2013-03-14 DIAGNOSIS — G40909 Epilepsy, unspecified, not intractable, without status epilepticus: Secondary | ICD-10-CM | POA: Insufficient documentation

## 2013-03-14 DIAGNOSIS — Z87891 Personal history of nicotine dependence: Secondary | ICD-10-CM | POA: Insufficient documentation

## 2013-03-14 DIAGNOSIS — Z86718 Personal history of other venous thrombosis and embolism: Secondary | ICD-10-CM | POA: Insufficient documentation

## 2013-03-14 DIAGNOSIS — J45909 Unspecified asthma, uncomplicated: Secondary | ICD-10-CM | POA: Insufficient documentation

## 2013-03-14 DIAGNOSIS — I10 Essential (primary) hypertension: Secondary | ICD-10-CM | POA: Insufficient documentation

## 2013-03-14 DIAGNOSIS — G43909 Migraine, unspecified, not intractable, without status migrainosus: Secondary | ICD-10-CM | POA: Insufficient documentation

## 2013-03-14 DIAGNOSIS — IMO0002 Reserved for concepts with insufficient information to code with codable children: Secondary | ICD-10-CM | POA: Insufficient documentation

## 2013-03-14 DIAGNOSIS — E039 Hypothyroidism, unspecified: Secondary | ICD-10-CM | POA: Insufficient documentation

## 2013-03-14 DIAGNOSIS — Z79899 Other long term (current) drug therapy: Secondary | ICD-10-CM | POA: Insufficient documentation

## 2013-03-14 MED ORDER — SODIUM CHLORIDE 0.9 % IV BOLUS (SEPSIS)
250.0000 mL | Freq: Once | INTRAVENOUS | Status: AC
  Administered 2013-03-14: 250 mL via INTRAVENOUS

## 2013-03-14 MED ORDER — ONDANSETRON HCL 4 MG/2ML IJ SOLN
4.0000 mg | Freq: Once | INTRAMUSCULAR | Status: AC
  Administered 2013-03-14: 4 mg via INTRAVENOUS
  Filled 2013-03-14: qty 2

## 2013-03-14 MED ORDER — OXYCODONE-ACETAMINOPHEN 5-325 MG PO TABS: 1.0000 | ORAL_TABLET | Freq: Four times a day (QID) | ORAL | Status: DC | PRN

## 2013-03-14 MED ORDER — SODIUM CHLORIDE 0.9 % IV SOLN: INTRAVENOUS | Status: DC

## 2013-03-14 MED ORDER — HYDROMORPHONE HCL PF 1 MG/ML IJ SOLN
1.0000 mg | Freq: Once | INTRAMUSCULAR | Status: AC
  Administered 2013-03-14: 1 mg via INTRAVENOUS
  Filled 2013-03-14: qty 1

## 2013-03-14 NOTE — ED Notes (Signed)
Spoke with Feliz Beam patients son told him she is being discharged home has been given medications for pain and nausea also spoke with patients son about the importance of keeping her appointment with Dr Kathrynn Speed for 130 today to discuss her results of ct scan as well as possible new medications for her headaches. Son verbalizes understanding states will come pick his mother up

## 2013-03-14 NOTE — ED Provider Notes (Signed)
History    CSN: [id number] Arrival date & time 03/14/13  0805  First MD Initiated Contact with Patient 03/14/13 0809     Chief Complaint  Patient presents with  . Migraine   (Consider location/radiation/quality/duration/timing/severity/associated sxs/prior Treatment) Patient is a 57 y.o. female presenting with migraines. The history is provided by the patient, the EMS personnel and a relative. The history is limited by the condition of the patient.  Migraine This is a chronic problem. The current episode started more than 1 week ago. Associated symptoms include headaches. Pertinent negatives include no chest pain, no abdominal pain and no shortness of breath.   patient has not had a true history of migraines but isn't complaining of headaches for the past several weeks. Head CT was done June 20 by her primary care Dr. Results will be given to the patient today at her appointment. However unlikely that there was anything acute or significant otherwise patient would've been notified earlier. 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 Exact cause of this is not known. There considering neurology appointment. Patient's status post previous stroke with a right-sided paralysis left-sided works fine also has an expressive aphasia. Level V caveat applies in its difficult to communicate but had been able to communicate some. We did to the family as well.  Patient states that the headache is anterior part of the head this is typical place for her. States the pain is 10 out of 10.Lambert Mody in nature not relieved by anything. Family denies any other new symptoms.  Patient has an appointment with her primary care Dr. at [facility] at 1:00 this afternoon. The reason for the appointment further workup of his head pain. Past Medical History  Diagnosis Date  . Seizures   . Hypertension   . Stroke     2007  - R sided weakness and expressive aphasia with h/o behavioral problems related to this  . Asthma   . Diabetes mellitus   . 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 Chronic systolic CHF (congestive heart failure)     a. s/p AICD ~2007 ([employer]. [employer]) - previous care [place]. No known hx CAD. b. EF 10% by echo 04/2012.  Marland Kitchen Atrial fibrillation     04/2012 - not felt to be an anticoag candidate long term at that time, but later had DVT so was on placed on Xarelto  . Coagulopathy     "Protein C deficiency"  . History of DVT (deep vein thrombosis)     a. 06/2012: RLE DVT, + protein C deficiency, placed on Xarelto. s/p IVC filter  . Noncompliance     Due to mental status, family has had to coax her to take medicines  . Hypothyroidism   . VT (ventricular tachycardia)     a. h/o ICD ~2007-2008. b. Adm 08/2012 with UTI, had VT/ICD shock x 5 (hypokalemic)  . Protein C deficiency    Past Surgical History  Procedure Laterality Date  . Cholecystectomy    . Appendectomy    . Pacemaker insertion    . Vena cava filter placement  07/19/2012    Procedure: INSERTION VENA-CAVA FILTER;  Surgeon: Larina Earthly, MD;  Location: [facility] OR;  Service: Vascular;  Laterality: [name];  . Insert / replace / remove pacemaker      ICD  . Abdominal hysterectomy     Family History  Problem Relation Age of Onset  . Coronary artery disease Neg Hx   . Hypertension Mother    History  Substance Use Topics  . Smoking status: Former Games developer  . Smokeless tobacco: Not on file  . Alcohol Use: No   OB History   Grav Para Term Preterm Abortions TAB SAB Ect Mult Living                 Review of Systems  Constitutional: Negative for fever.  HENT: Negative for neck pain.   Eyes: Negative for visual disturbance.  Respiratory: Negative for shortness of breath.   Cardiovascular: Negative for chest pain.  Gastrointestinal: Negative for abdominal pain.  Musculoskeletal: Negative for back pain.  Skin: Negative for rash.   Neurological: Positive for headaches.  Hematological: Does not bruise/bleed easily.  Psychiatric/Behavioral: Positive for behavioral problems and agitation. Negative for confusion.   level V caveat applies but able to get lots or use his information from the patient was working with her expressive aphasia and also to the family.  Allergies  Penicillins and Sulfa antibiotics  Home Medications   Current Outpatient Rx  Name  Route  Sig  Dispense  Refill  . acetaminophen (TYLENOL) 325 MG tablet   Oral   Take 650 mg by mouth every 6 (six) hours as needed for pain.         Marland Kitchen albuterol (PROVENTIL) (2.5 MG/3ML) 0.083% nebulizer solution   Nebulization   Take 2.5 mg by nebulization every 6 (six) hours as needed. For cough or wheeze         . ALPRAZolam (XANAX) 0.5 MG tablet   Oral   Take 1 tablet (0.5 mg total) by mouth at bedtime as needed for sleep. For sleep   30 tablet   0   . amiodarone (PACERONE) 400 MG tablet   Oral   Take 200 mg by mouth daily.         Marland Kitchen aspirin 81 MG EC tablet   Oral   Take 1 tablet (81 mg total) by mouth daily. Swallow whole.   30 tablet   12   . butalbital-acetaminophen-caffeine (FIORICET, ESGIC) 50-325-40 MG per tablet   Oral   Take 1 tablet by mouth every 4 (four) hours as needed for headache.   14 tablet   0   . digoxin (LANOXIN) 0.125 MG tablet   Oral   Take 1 tablet (0.125 mg total) by mouth daily.   30 tablet   0   . docusate sodium 100 MG CAPS   Oral   Take 100 mg by mouth 2 (two) times daily.   10 capsule      . feeding supplement (ENSURE COMPLETE) LIQD   Oral   Take 237 mLs by mouth 2 (two) times daily between meals.         . furosemide (LASIX) 40 MG tablet      TAKE 1 TABLET TWICE A DAY   60 tablet   6   . levETIRAcetam (KEPPRA) 500 MG tablet   Oral   Take 1,000 mg by mouth 2 (two) times daily.         Marland Kitchen levothyroxine (SYNTHROID, LEVOTHROID) 50 MCG tablet   Oral   Take 50 mcg by mouth daily.         Marland Kitchen  lisinopril (PRINIVIL,ZESTRIL) 10 MG tablet   Oral   Take 1 tablet (10 mg total) by mouth daily.   30 tablet   3   . metolazone (  ZAROXOLYN) 2.5 MG tablet   Oral   Take 1 tablet (2.5 mg total) by mouth as needed.   10 tablet   3   . metoprolol succinate (TOPROL-XL) 25 MG 24 hr tablet   Oral   Take 25 mg by mouth 2 (two) times daily.         Marland Kitchen omeprazole (PRILOSEC) 20 MG capsule   Oral   Take 20 mg by mouth 2 (two) times daily.         Marland Kitchen oxyCODONE-acetaminophen (PERCOCET) 10-325 MG per tablet   Oral   Take 1 tablet by mouth every 6 (six) hours as needed for pain. Scheduled per pharmacy   30 tablet   0   . oxyCODONE-acetaminophen (PERCOCET/ROXICET) 5-325 MG per tablet   Oral   Take 1-2 tablets by mouth every 6 (six) hours as needed for pain.   15 tablet   0   . polyethylene glycol (MIRALAX / GLYCOLAX) packet   Oral   Take 17 g by mouth 2 (two) times daily.   14 each      . potassium chloride SA (K-DUR,KLOR-CON) 20 MEQ tablet   Oral   Take 20 mEq by mouth 2 (two) times daily.         . Rivaroxaban (XARELTO) 20 MG TABS   Oral   Take 1 tablet (20 mg total) by mouth daily with supper.   30 tablet   6   . spironolactone (ALDACTONE) 25 MG tablet   Oral   Take 1 tablet (25 mg total) by mouth daily.   30 tablet   3    BP 132/78  Pulse 68  Temp(Src) 98.9 F (37.2 C) (Oral)  Resp 20  SpO2 98% Physical Exam  Constitutional: She appears well-developed and well-nourished. She appears distressed.  HENT:  Head: Normocephalic and atraumatic.  Mouth/Throat: Oropharynx is clear and moist.  Eyes: Conjunctivae and EOM are normal. Pupils are equal, round, and reactive to light.  Neck: Normal range of motion.  Cardiovascular: Normal rate, regular rhythm and normal heart sounds.   Pulmonary/Chest: Effort normal and breath sounds normal. No respiratory distress.  Abdominal: Soft. Bowel sounds are normal. There is no tenderness.  Musculoskeletal: Normal range of  motion. She exhibits no edema.  Neurological: She is alert.  Baseline expressive aphasia. Paralysis on the right side left side with normal movement and strength.   Skin: Skin is warm. No rash noted.    ED Course  Procedures (including critical care time) Labs Reviewed - No data to display No results found. 1. Headache in front of head     MDM  Patient with several month history of chronic head pain. Followed by [facility] clinic. Patient had CAT scan of her head done on June 20 to get the final report from her doctor later today with an appointment at 1:00. They're considering a neurology consultation. Suspect that head CT without any significant findings otherwise patient would've been notified earlier. According to family members patient's been complaining of head pain constantly and feeling cold constantly for several weeks now.  Patient's vital signs here without any acute abnormalities. Patient's pain improved significantly with 1 mg of Dilaudid. Patient with pre-existing right-sided paralysis from CVA. Patient is able to ambulate with a walker. Patient will be discharged back home. Did not do a CT scan today since she had one recently.  Shelda Jakes, MD 03/14/13 [phone]

## 2013-03-14 NOTE — ED Notes (Signed)
Here via ems has caregiver at home states that she has been yelling with migraine since 0400 had ct scan done earlier in week have appointment with neurologist this afternoon for results of ct scan as well as update or new medications

## 2013-03-25 ENCOUNTER — Other Ambulatory Visit (HOSPITAL_COMMUNITY): Payer: Self-pay | Admitting: *Deleted

## 2013-03-25 MED ORDER — AMIODARONE HCL 400 MG PO TABS: 200.0000 mg | ORAL_TABLET | Freq: Every day | ORAL | Status: DC

## 2013-04-01 ENCOUNTER — Encounter (HOSPITAL_BASED_OUTPATIENT_CLINIC_OR_DEPARTMENT_OTHER): Payer: Self-pay | Admitting: *Deleted

## 2013-04-01 ENCOUNTER — Emergency Department (HOSPITAL_BASED_OUTPATIENT_CLINIC_OR_DEPARTMENT_OTHER)
Admission: EM | Admit: 2013-04-01 | Discharge: 2013-04-01 | Disposition: A | Discharge status: Home / Self Care | Payer: Medicare Other | Attending: Emergency Medicine | Admitting: Emergency Medicine

## 2013-04-01 DIAGNOSIS — D6859 Other primary thrombophilia: Secondary | ICD-10-CM | POA: Insufficient documentation

## 2013-04-01 DIAGNOSIS — Z86718 Personal history of other venous thrombosis and embolism: Secondary | ICD-10-CM | POA: Insufficient documentation

## 2013-04-01 DIAGNOSIS — I5022 Chronic systolic (congestive) heart failure: Secondary | ICD-10-CM | POA: Insufficient documentation

## 2013-04-01 DIAGNOSIS — Z8669 Personal history of other diseases of the nervous system and sense organs: Secondary | ICD-10-CM | POA: Insufficient documentation

## 2013-04-01 DIAGNOSIS — Z87891 Personal history of nicotine dependence: Secondary | ICD-10-CM | POA: Insufficient documentation

## 2013-04-01 DIAGNOSIS — Z9119 Patient's noncompliance with other medical treatment and regimen: Secondary | ICD-10-CM | POA: Insufficient documentation

## 2013-04-01 DIAGNOSIS — Z8673 Personal history of transient ischemic attack (TIA), and cerebral infarction without residual deficits: Secondary | ICD-10-CM | POA: Insufficient documentation

## 2013-04-01 DIAGNOSIS — Z880 Allergy status to penicillin: Secondary | ICD-10-CM | POA: Insufficient documentation

## 2013-04-01 DIAGNOSIS — G43909 Migraine, unspecified, not intractable, without status migrainosus: Secondary | ICD-10-CM | POA: Insufficient documentation

## 2013-04-01 DIAGNOSIS — E119 Type 2 diabetes mellitus without complications: Secondary | ICD-10-CM | POA: Insufficient documentation

## 2013-04-01 DIAGNOSIS — Z862 Personal history of diseases of the blood and blood-forming organs and certain disorders involving the immune mechanism: Secondary | ICD-10-CM | POA: Insufficient documentation

## 2013-04-01 DIAGNOSIS — Z8639 Personal history of other endocrine, nutritional and metabolic disease: Secondary | ICD-10-CM | POA: Insufficient documentation

## 2013-04-01 DIAGNOSIS — I4891 Unspecified atrial fibrillation: Secondary | ICD-10-CM | POA: Insufficient documentation

## 2013-04-01 DIAGNOSIS — E039 Hypothyroidism, unspecified: Secondary | ICD-10-CM | POA: Insufficient documentation

## 2013-04-01 DIAGNOSIS — Z87448 Personal history of other diseases of urinary system: Secondary | ICD-10-CM | POA: Insufficient documentation

## 2013-04-01 DIAGNOSIS — R42 Dizziness and giddiness: Secondary | ICD-10-CM | POA: Insufficient documentation

## 2013-04-01 DIAGNOSIS — Z7982 Long term (current) use of aspirin: Secondary | ICD-10-CM | POA: Insufficient documentation

## 2013-04-01 DIAGNOSIS — Z950 Presence of cardiac pacemaker: Secondary | ICD-10-CM | POA: Insufficient documentation

## 2013-04-01 DIAGNOSIS — Z91199 Patient's noncompliance with other medical treatment and regimen due to unspecified reason: Secondary | ICD-10-CM | POA: Insufficient documentation

## 2013-04-01 DIAGNOSIS — Z7901 Long term (current) use of anticoagulants: Secondary | ICD-10-CM | POA: Insufficient documentation

## 2013-04-01 DIAGNOSIS — J45909 Unspecified asthma, uncomplicated: Secondary | ICD-10-CM | POA: Insufficient documentation

## 2013-04-01 DIAGNOSIS — Z79899 Other long term (current) drug therapy: Secondary | ICD-10-CM | POA: Insufficient documentation

## 2013-04-01 DIAGNOSIS — I10 Essential (primary) hypertension: Secondary | ICD-10-CM | POA: Insufficient documentation

## 2013-04-01 DIAGNOSIS — F411 Generalized anxiety disorder: Secondary | ICD-10-CM | POA: Insufficient documentation

## 2013-04-01 LAB — CBC WITH DIFFERENTIAL/PLATELET
Basophils Relative: 0 % (ref 0–1)
Eosinophils Absolute: 0.1 10*3/uL (ref 0.0–0.7)
Eosinophils Relative: 1 % (ref 0–5)
Hemoglobin: 10 g/dL — ABNORMAL LOW (ref 12.0–15.0)
Lymphs Abs: 1.7 10*3/uL (ref 0.7–4.0)
MCH: 25.8 pg — ABNORMAL LOW (ref 26.0–34.0)
MCHC: 30.5 g/dL (ref 30.0–36.0)
MCV: 84.8 fL (ref 78.0–100.0)
Monocytes Relative: 13 % — ABNORMAL HIGH (ref 3–12)
Neutrophils Relative %: 59 % (ref 43–77)
Platelets: 254 10*3/uL (ref 150–400)
RBC: 3.87 MIL/uL (ref 3.87–5.11)

## 2013-04-01 LAB — BASIC METABOLIC PANEL
BUN: 18 mg/dL (ref 6–23)
Calcium: 10 mg/dL (ref 8.4–10.5)
GFR calc Af Amer: 83 mL/min — ABNORMAL LOW (ref >90)
GFR calc non Af Amer: 71 mL/min — ABNORMAL LOW (ref >90)
Glucose, Bld: 129 mg/dL — ABNORMAL HIGH (ref 70–99)

## 2013-04-01 MED ORDER — DIPHENHYDRAMINE HCL 50 MG/ML IJ SOLN
25.0000 mg | Freq: Once | INTRAMUSCULAR | Status: AC
  Administered 2013-04-01: 25 mg via INTRAVENOUS
  Filled 2013-04-01: qty 1

## 2013-04-01 MED ORDER — METOCLOPRAMIDE HCL 5 MG/ML IJ SOLN
10.0000 mg | Freq: Once | INTRAMUSCULAR | Status: AC
  Administered 2013-04-01: 10 mg via INTRAVENOUS
  Filled 2013-04-01: qty 2

## 2013-04-01 MED ORDER — ALPRAZOLAM 0.5 MG PO TABS: 0.5000 mg | ORAL_TABLET | Freq: Every evening | ORAL | Status: DC | PRN

## 2013-04-01 NOTE — ED Notes (Signed)
Per brother pt c/o HA, dizziness since 3am-pt disphasia-hx of stroke-abnormal speech is her normal-pt is alert

## 2013-04-01 NOTE — ED Notes (Signed)
MD at bedside. 

## 2013-04-01 NOTE — ED Notes (Signed)
Pt took percocet around 2 pm but is out of her xanax and cant see PMD again until July 25.

## 2013-04-01 NOTE — ED Provider Notes (Signed)
History    CSN: [id number] Arrival date & time 04/01/13  1954  First MD Initiated Contact with Patient 04/01/13 2013     Chief Complaint  Patient presents with  . Headache   (Consider location/radiation/quality/duration/timing/severity/associated sxs/prior Treatment) Patient is a 57 y.o. female presenting with headaches. The history is provided by a relative.  Headache Pain location:  L parietal Quality:  Sharp and stabbing Radiates to:  Does not radiate Severity currently:  9/10 Severity at highest:  9/10 Onset quality:  Gradual Duration:  12 hours Timing:  Constant Progression:  Unchanged Chronicity:  Chronic Similar to prior headaches: yes   Context: loud noise   Context: not exposure to bright light and not eating   Relieved by:  Nothing Worsened by:  Sound Ineffective treatments: Tried her Percocet at home which helped for a brief time and then the headache returned. Associated symptoms: dizziness   Associated symptoms: no abdominal pain, no blurred vision, no facial pain, no fever, no nausea, no near-syncope, no seizures, no sinus pressure, no syncope, no tingling, no visual change and no weakness   Associated symptoms comment:  Also states that she's been very anxious because she ran out of her Xanax and cannot see her PMD and told July 25 Risk factors comment:  Chronic headaches and prior s stroke  Past Medical History  Diagnosis Date  . Seizures   . Hypertension   . Stroke     2007 - R sided weakness and expressive aphasia with h/o behavioral problems related to this  . Asthma   . Diabetes mellitus   . 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 Chronic systolic CHF (congestive heart failure)     a. s/p AICD ~2007 (St. Jude) - previous care [place]. No known hx CAD. b. EF 10% by echo 04/2012.  Marland Kitchen Atrial fibrillation     04/2012 - not felt to be an anticoag candidate long term at that time, but later had DVT so was on placed on Xarelto  . Coagulopathy    "Protein C deficiency"  . History of DVT (deep vein thrombosis)     a. 06/2012: RLE DVT, + protein C deficiency, placed on Xarelto. s/p IVC filter  . Noncompliance     Due to mental status, family has had to coax her to take medicines  . Hypothyroidism   . VT (ventricular tachycardia)     a. h/o ICD ~2007-2008. b. Adm 08/2012 with UTI, had VT/ICD shock x 5 (hypokalemic)  . Protein C deficiency    Past Surgical History  Procedure Laterality Date  . Cholecystectomy    . Appendectomy    . Pacemaker insertion    . Vena cava filter placement  07/19/2012    Procedure: INSERTION VENA-CAVA FILTER;  Surgeon: Larina Earthly, MD;  Location: [facility] OR;  Service: Vascular;  Laterality: [name];  . Insert / replace / remove pacemaker      ICD  . Abdominal hysterectomy     Family History  Problem Relation Age of Onset  . Coronary artery disease Neg Hx   . Hypertension Mother    History  Substance Use Topics  . Smoking status: Former Games developer  . Smokeless tobacco: Not on file  . Alcohol Use: No   OB History   Grav Para Term Preterm Abortions TAB SAB Ect Mult Living                 Review of Systems  Constitutional: Negative  for fever.  HENT: Negative for sinus pressure.   Eyes: Negative for blurred vision.  Cardiovascular: Negative for syncope and near-syncope.  Gastrointestinal: Negative for nausea and abdominal pain.  Neurological: Positive for dizziness and headaches. Negative for seizures.  All other systems reviewed and are negative.    Allergies  Penicillins and Sulfa antibiotics  Home Medications   Current Outpatient Rx  Name  Route  Sig  Dispense  Refill  . acetaminophen (TYLENOL) 325 MG tablet   Oral   Take 650 mg by mouth every 6 (six) hours as needed for pain.         Marland Kitchen albuterol (PROVENTIL) (2.5 MG/3ML) 0.083% nebulizer solution   Nebulization   Take 2.5 mg by nebulization every 6 (six) hours as needed. For cough or wheeze         . ALPRAZolam (XANAX) 0.5 MG tablet    Oral   Take 1 tablet (0.5 mg total) by mouth at bedtime as needed for sleep. For sleep   15 tablet   0   . amiodarone (PACERONE) 400 MG tablet   Oral   Take 0.5 tablets (200 mg total) by mouth daily.   15 tablet   3   . aspirin 81 MG EC tablet   Oral   Take 1 tablet (81 mg total) by mouth daily. Swallow whole.   30 tablet   12   . butalbital-acetaminophen-caffeine (FIORICET, ESGIC) 50-325-40 MG per tablet   Oral   Take 1 tablet by mouth every 4 (four) hours as needed for headache.   14 tablet   0   . digoxin (LANOXIN) 0.125 MG tablet   Oral   Take 1 tablet (0.125 mg total) by mouth daily.   30 tablet   0   . docusate sodium 100 MG CAPS   Oral   Take 100 mg by mouth 2 (two) times daily.   10 capsule      . feeding supplement (ENSURE COMPLETE) LIQD   Oral   Take 237 mLs by mouth 2 (two) times daily between meals.         . furosemide (LASIX) 40 MG tablet      TAKE 1 TABLET TWICE A DAY   60 tablet   6   . levETIRAcetam (KEPPRA) 500 MG tablet   Oral   Take 1,000 mg by mouth 2 (two) times daily.         Marland Kitchen levothyroxine (SYNTHROID, LEVOTHROID) 50 MCG tablet   Oral   Take 50 mcg by mouth daily.         Marland Kitchen lisinopril (PRINIVIL,ZESTRIL) 10 MG tablet   Oral   Take 1 tablet (10 mg total) by mouth daily.   30 tablet   3   . metolazone (ZAROXOLYN) 2.5 MG tablet   Oral   Take 1 tablet (2.5 mg total) by mouth as needed.   10 tablet   3   . metoprolol succinate (TOPROL-XL) 25 MG 24 hr tablet   Oral   Take 25 mg by mouth 2 (two) times daily.         Marland Kitchen omeprazole (PRILOSEC) 20 MG capsule   Oral   Take 20 mg by mouth 2 (two) times daily.         Marland Kitchen oxyCODONE-acetaminophen (PERCOCET) 10-325 MG per tablet   Oral   Take 1 tablet by mouth every 6 (six) hours as needed for pain. Scheduled per pharmacy   30 tablet   0   .  oxyCODONE-acetaminophen (PERCOCET/ROXICET) 5-325 MG per tablet   Oral   Take 1-2 tablets by mouth every 6 (six) hours as needed for  pain.   15 tablet   0   . polyethylene glycol (MIRALAX / GLYCOLAX) packet   Oral   Take 17 g by mouth 2 (two) times daily.   14 each      . potassium chloride SA (K-DUR,KLOR-CON) 20 MEQ tablet   Oral   Take 20 mEq by mouth 2 (two) times daily.         . Rivaroxaban (XARELTO) 20 MG TABS   Oral   Take 1 tablet (20 mg total) by mouth daily with supper.   30 tablet   6   . spironolactone (ALDACTONE) 25 MG tablet   Oral   Take 1 tablet (25 mg total) by mouth daily.   30 tablet   3    BP 124/60  Pulse 118  Temp(Src) 98.7 F (37.1 C) (Oral)  Resp 20  Ht 5' (1.524 m)  Wt 118 lb (53.524 kg)  BMI 23.05 kg/m2  SpO2 100% Physical Exam  Nursing note and vitals reviewed. Constitutional: She is oriented to person, place, and time. She appears well-developed and well-nourished. She appears distressed.  HENT:  Head: Normocephalic and atraumatic.  Eyes: EOM are normal. Pupils are equal, round, and reactive to light.  Fundoscopic exam:      The right eye shows no papilledema.       The left eye shows no papilledema.  Neck: Normal range of motion. Neck supple.  Cardiovascular: Normal rate, regular rhythm, normal heart sounds and intact distal pulses.  Exam reveals no friction rub.   No murmur heard. Pulmonary/Chest: Effort normal and breath sounds normal. She has no wheezes. She has no rales.  Abdominal: Soft. Bowel sounds are normal. She exhibits no distension. There is no tenderness. There is no rebound and no guarding.  Musculoskeletal: Normal range of motion. She exhibits no tenderness.  No edema  Lymphadenopathy:    She has no cervical adenopathy.  Neurological: She is alert and oriented to person, place, and time. She has normal strength. No cranial nerve deficit or sensory deficit. Gait normal.  Baseline expressive aphasia.  Paralysis on the right side. Left side with normal movement and strength  Skin: Skin is warm and dry. No rash noted.  Psychiatric: She has a normal  mood and affect. Her behavior is normal.    ED Course  Procedures (including critical care time) Labs Reviewed  CBC WITH DIFFERENTIAL - Abnormal; Notable for the following:    Hemoglobin 10.0 (*)    HCT 32.8 (*)    MCH 25.8 (*)    RDW 18.3 (*)    Monocytes Relative 13 (*)    All other components within normal limits  BASIC METABOLIC PANEL - Abnormal; Notable for the following:    Glucose, Bld 129 (*)    GFR calc non Af Amer 71 (*)    GFR calc Af Amer 83 (*)    All other components within normal limits   No results found. 1. Migraine     MDM   Pt with typical migraine HA without sx suggestive of SAH(sudden onset, worst of life, or deficits), infection, or cavernous vein thrombosis.  Normal neuro exam and vital signs.  Brother states that the percocet she has at home helps headache some and then pain returned.  Brother also states pt fan out of her anxiety medication and had an anxiety attack  earlier today but improved now.  Patient does have mild slurred speech on exam however she has a prior stroke and this is her baseline per brother. Will give HA cocktail and on re-eval.  patient is much improved and resting comfortably with improvement of heart rate less than 100. Patient's labs here were normal without acute findings. Given no new focal neurologic findings and headaches similar 1 she's had in the past discharge home follow up with PCP. Also given prescription for some of her Xanax said that she does not have withdrawal   Gwyneth Sprout, MD 04/01/13 [phone]

## 2013-04-11 ENCOUNTER — Inpatient Hospital Stay (HOSPITAL_BASED_OUTPATIENT_CLINIC_OR_DEPARTMENT_OTHER)
Admission: EM | Admit: 2013-04-11 | Discharge: 2013-04-14 | DRG: 291 | Disposition: A | Discharge status: Home / Self Care | Payer: Medicare Other | Attending: Internal Medicine | Admitting: Internal Medicine

## 2013-04-11 ENCOUNTER — Emergency Department (HOSPITAL_BASED_OUTPATIENT_CLINIC_OR_DEPARTMENT_OTHER): Payer: Medicare Other

## 2013-04-11 ENCOUNTER — Encounter (HOSPITAL_BASED_OUTPATIENT_CLINIC_OR_DEPARTMENT_OTHER): Payer: Self-pay | Admitting: Emergency Medicine

## 2013-04-11 DIAGNOSIS — I059 Rheumatic mitral valve disease, unspecified: Secondary | ICD-10-CM

## 2013-04-11 DIAGNOSIS — R4701 Aphasia: Secondary | ICD-10-CM | POA: Diagnosis present

## 2013-04-11 DIAGNOSIS — Z79899 Other long term (current) drug therapy: Secondary | ICD-10-CM

## 2013-04-11 DIAGNOSIS — G8929 Other chronic pain: Secondary | ICD-10-CM | POA: Diagnosis present

## 2013-04-11 DIAGNOSIS — Z95810 Presence of automatic (implantable) cardiac defibrillator: Secondary | ICD-10-CM | POA: Diagnosis present

## 2013-04-11 DIAGNOSIS — I10 Essential (primary) hypertension: Secondary | ICD-10-CM | POA: Diagnosis present

## 2013-04-11 DIAGNOSIS — J9600 Acute respiratory failure, unspecified whether with hypoxia or hypercapnia: Secondary | ICD-10-CM | POA: Diagnosis present

## 2013-04-11 DIAGNOSIS — Z87891 Personal history of nicotine dependence: Secondary | ICD-10-CM

## 2013-04-11 DIAGNOSIS — I509 Heart failure, unspecified: Secondary | ICD-10-CM

## 2013-04-11 DIAGNOSIS — Z880 Allergy status to penicillin: Secondary | ICD-10-CM

## 2013-04-11 DIAGNOSIS — F1110 Opioid abuse, uncomplicated: Secondary | ICD-10-CM | POA: Diagnosis present

## 2013-04-11 DIAGNOSIS — J4489 Other specified chronic obstructive pulmonary disease: Secondary | ICD-10-CM | POA: Diagnosis present

## 2013-04-11 DIAGNOSIS — I5023 Acute on chronic systolic (congestive) heart failure: Principal | ICD-10-CM | POA: Diagnosis present

## 2013-04-11 DIAGNOSIS — F1910 Other psychoactive substance abuse, uncomplicated: Secondary | ICD-10-CM | POA: Diagnosis present

## 2013-04-11 DIAGNOSIS — I429 Cardiomyopathy, unspecified: Secondary | ICD-10-CM

## 2013-04-11 DIAGNOSIS — I69320 Aphasia following cerebral infarction: Secondary | ICD-10-CM

## 2013-04-11 DIAGNOSIS — D6859 Other primary thrombophilia: Secondary | ICD-10-CM | POA: Diagnosis present

## 2013-04-11 DIAGNOSIS — J449 Chronic obstructive pulmonary disease, unspecified: Secondary | ICD-10-CM | POA: Diagnosis present

## 2013-04-11 DIAGNOSIS — E119 Type 2 diabetes mellitus without complications: Secondary | ICD-10-CM | POA: Diagnosis present

## 2013-04-11 DIAGNOSIS — Z8673 Personal history of transient ischemic attack (TIA), and cerebral infarction without residual deficits: Secondary | ICD-10-CM

## 2013-04-11 DIAGNOSIS — I4891 Unspecified atrial fibrillation: Secondary | ICD-10-CM

## 2013-04-11 DIAGNOSIS — I252 Old myocardial infarction: Secondary | ICD-10-CM

## 2013-04-11 DIAGNOSIS — Z86718 Personal history of other venous thrombosis and embolism: Secondary | ICD-10-CM

## 2013-04-11 DIAGNOSIS — E039 Hypothyroidism, unspecified: Secondary | ICD-10-CM | POA: Diagnosis present

## 2013-04-11 LAB — CBC WITH DIFFERENTIAL/PLATELET
Basophils Absolute: 0 10*3/uL (ref 0.0–0.1)
Basophils Relative: 0 % (ref 0–1)
Eosinophils Absolute: 0.1 10*3/uL (ref 0.0–0.7)
Eosinophils Relative: 1 % (ref 0–5)
HCT: 34.8 % — ABNORMAL LOW (ref 36.0–46.0)
MCH: 25.7 pg — ABNORMAL LOW (ref 26.0–34.0)
MCHC: 29.3 g/dL — ABNORMAL LOW (ref 30.0–36.0)
Monocytes Absolute: 1.6 10*3/uL — ABNORMAL HIGH (ref 0.1–1.0)
Neutro Abs: 7.9 10*3/uL — ABNORMAL HIGH (ref 1.7–7.7)
RDW: 18.7 % — ABNORMAL HIGH (ref 11.5–15.5)

## 2013-04-11 LAB — COMPREHENSIVE METABOLIC PANEL
BUN: 13 mg/dL (ref 6–23)
CO2: 20 mEq/L (ref 19–32)
Calcium: 9.8 mg/dL (ref 8.4–10.5)
Creatinine, Ser: 0.8 mg/dL (ref 0.50–1.10)
GFR calc Af Amer: >90 mL/min (ref >90)
GFR calc non Af Amer: 82 mL/min — ABNORMAL LOW (ref >90)
Glucose, Bld: 212 mg/dL — ABNORMAL HIGH (ref 70–99)
Total Protein: 8.7 g/dL — ABNORMAL HIGH (ref 6.0–8.3)

## 2013-04-11 LAB — TROPONIN I: Troponin I: <0.3 ng/mL (ref <0.30)

## 2013-04-11 LAB — URINALYSIS, ROUTINE W REFLEX MICROSCOPIC
Nitrite: NEGATIVE
Protein, ur: 100 mg/dL — AB
Specific Gravity, Urine: 1.018 (ref 1.005–1.030)
Urobilinogen, UA: 1 mg/dL (ref 0.0–1.0)

## 2013-04-11 LAB — HEMOGLOBIN A1C: Mean Plasma Glucose: 148 mg/dL — ABNORMAL HIGH (ref <117)

## 2013-04-11 LAB — CBC
HCT: 29.3 % — ABNORMAL LOW (ref 36.0–46.0)
Hemoglobin: 8.8 g/dL — ABNORMAL LOW (ref 12.0–15.0)
MCHC: 30 g/dL (ref 30.0–36.0)
MCV: 84.4 fL (ref 78.0–100.0)
RDW: 18.2 % — ABNORMAL HIGH (ref 11.5–15.5)

## 2013-04-11 LAB — URINE MICROSCOPIC-ADD ON

## 2013-04-11 LAB — CREATININE, SERUM: GFR calc Af Amer: >90 mL/min (ref >90)

## 2013-04-11 LAB — PRO B NATRIURETIC PEPTIDE: Pro B Natriuretic peptide (BNP): 10700 pg/mL — ABNORMAL HIGH (ref 0–125)

## 2013-04-11 LAB — GLUCOSE, CAPILLARY
Glucose-Capillary: 204 mg/dL — ABNORMAL HIGH (ref 70–99)
Glucose-Capillary: 156 mg/dL — ABNORMAL HIGH (ref 70–99)

## 2013-04-11 LAB — LIPASE, BLOOD: Lipase: 15 U/L (ref 11–59)

## 2013-04-11 LAB — MRSA PCR SCREENING: MRSA by PCR: NEGATIVE

## 2013-04-11 MED ORDER — ALBUTEROL SULFATE (5 MG/ML) 0.5% IN NEBU: 2.5000 mg | INHALATION_SOLUTION | RESPIRATORY_TRACT | Status: DC

## 2013-04-11 MED ORDER — FUROSEMIDE 10 MG/ML IJ SOLN
40.0000 mg | Freq: Once | INTRAMUSCULAR | Status: AC
  Administered 2013-04-11: 40 mg via INTRAVENOUS
  Filled 2013-04-11: qty 4

## 2013-04-11 MED ORDER — BUTALBITAL-APAP-CAFFEINE 50-325-40 MG PO TABS
1.0000 | ORAL_TABLET | ORAL | Status: DC | PRN
  Administered 2013-04-12 – 2013-04-14 (×5): 1 via ORAL
  Filled 2013-04-11 (×8): qty 1

## 2013-04-11 MED ORDER — IPRATROPIUM BROMIDE 0.02 % IN SOLN
0.5000 mg | Freq: Two times a day (BID) | RESPIRATORY_TRACT | Status: DC
  Filled 2013-04-11: qty 2.5

## 2013-04-11 MED ORDER — SODIUM CHLORIDE 0.9 % IJ SOLN
3.0000 mL | Freq: Two times a day (BID) | INTRAMUSCULAR | Status: DC
  Administered 2013-04-11 – 2013-04-13 (×5): 3 mL via INTRAVENOUS

## 2013-04-11 MED ORDER — SODIUM CHLORIDE 0.9 % IV SOLN: 250.0000 mL | INTRAVENOUS | Status: DC | PRN

## 2013-04-11 MED ORDER — LISINOPRIL 10 MG PO TABS
10.0000 mg | ORAL_TABLET | Freq: Every day | ORAL | Status: DC
  Administered 2013-04-12: 10 mg via ORAL
  Filled 2013-04-11 (×4): qty 1

## 2013-04-11 MED ORDER — INSULIN ASPART 100 UNIT/ML ~~LOC~~ SOLN
0.0000 [IU] | Freq: Every day | SUBCUTANEOUS | Status: DC
  Administered 2013-04-12: 2 [IU] via SUBCUTANEOUS

## 2013-04-11 MED ORDER — DOCUSATE SODIUM 100 MG PO CAPS
100.0000 mg | ORAL_CAPSULE | Freq: Two times a day (BID) | ORAL | Status: DC
  Administered 2013-04-11 – 2013-04-13 (×3): 100 mg via ORAL
  Filled 2013-04-11 (×8): qty 1

## 2013-04-11 MED ORDER — ASPIRIN EC 81 MG PO TBEC
81.0000 mg | DELAYED_RELEASE_TABLET | Freq: Every day | ORAL | Status: DC
  Administered 2013-04-12: 81 mg via ORAL
  Filled 2013-04-11 (×4): qty 1

## 2013-04-11 MED ORDER — FUROSEMIDE 10 MG/ML IJ SOLN
INTRAMUSCULAR | Status: AC
  Administered 2013-04-11: 09:00:00
  Filled 2013-04-11: qty 8

## 2013-04-11 MED ORDER — METOPROLOL SUCCINATE ER 25 MG PO TB24
25.0000 mg | ORAL_TABLET | Freq: Two times a day (BID) | ORAL | Status: DC
  Administered 2013-04-11 – 2013-04-13 (×4): 25 mg via ORAL
  Filled 2013-04-11 (×8): qty 1

## 2013-04-11 MED ORDER — ALBUTEROL SULFATE (5 MG/ML) 0.5% IN NEBU
2.5000 mg | INHALATION_SOLUTION | Freq: Two times a day (BID) | RESPIRATORY_TRACT | Status: DC
  Filled 2013-04-11: qty 0.5

## 2013-04-11 MED ORDER — ALBUTEROL SULFATE (5 MG/ML) 0.5% IN NEBU
5.0000 mg | INHALATION_SOLUTION | Freq: Once | RESPIRATORY_TRACT | Status: AC
  Administered 2013-04-11: 5 mg via RESPIRATORY_TRACT
  Filled 2013-04-11: qty 1

## 2013-04-11 MED ORDER — DIGOXIN 125 MCG PO TABS
0.1250 mg | ORAL_TABLET | Freq: Every day | ORAL | Status: DC
  Administered 2013-04-12: 0.125 mg via ORAL
  Filled 2013-04-11 (×4): qty 1

## 2013-04-11 MED ORDER — ACETAMINOPHEN 325 MG PO TABS
650.0000 mg | ORAL_TABLET | Freq: Four times a day (QID) | ORAL | Status: DC | PRN
  Filled 2013-04-11: qty 2

## 2013-04-11 MED ORDER — AMIODARONE HCL 200 MG PO TABS
200.0000 mg | ORAL_TABLET | Freq: Every day | ORAL | Status: DC
  Administered 2013-04-12: 200 mg via ORAL
  Filled 2013-04-11 (×4): qty 1

## 2013-04-11 MED ORDER — IPRATROPIUM BROMIDE 0.02 % IN SOLN: 0.5000 mg | RESPIRATORY_TRACT | Status: DC | PRN

## 2013-04-11 MED ORDER — ALBUTEROL SULFATE (5 MG/ML) 0.5% IN NEBU
INHALATION_SOLUTION | RESPIRATORY_TRACT | Status: AC
  Administered 2013-04-11: 2.5 mg
  Filled 2013-04-11: qty 0.5

## 2013-04-11 MED ORDER — SPIRONOLACTONE 25 MG PO TABS
25.0000 mg | ORAL_TABLET | Freq: Every day | ORAL | Status: DC
  Administered 2013-04-12: 25 mg via ORAL
  Filled 2013-04-11 (×4): qty 1

## 2013-04-11 MED ORDER — POTASSIUM CHLORIDE CRYS ER 20 MEQ PO TBCR
20.0000 meq | EXTENDED_RELEASE_TABLET | Freq: Two times a day (BID) | ORAL | Status: DC
  Administered 2013-04-12 – 2013-04-13 (×3): 20 meq via ORAL
  Filled 2013-04-11 (×7): qty 1

## 2013-04-11 MED ORDER — POLYETHYLENE GLYCOL 3350 17 G PO PACK
17.0000 g | PACK | Freq: Two times a day (BID) | ORAL | Status: DC
  Administered 2013-04-11 – 2013-04-12 (×2): 17 g via ORAL
  Filled 2013-04-11 (×8): qty 1

## 2013-04-11 MED ORDER — METHYLPREDNISOLONE SODIUM SUCC 125 MG IJ SOLR
60.0000 mg | Freq: Two times a day (BID) | INTRAMUSCULAR | Status: DC
  Administered 2013-04-11 – 2013-04-12 (×4): 60 mg via INTRAVENOUS
  Filled 2013-04-11 (×5): qty 0.96

## 2013-04-11 MED ORDER — PANTOPRAZOLE SODIUM 40 MG PO TBEC
40.0000 mg | DELAYED_RELEASE_TABLET | Freq: Every day | ORAL | Status: DC
  Administered 2013-04-12: 40 mg via ORAL
  Filled 2013-04-11 (×3): qty 1

## 2013-04-11 MED ORDER — INSULIN ASPART 100 UNIT/ML ~~LOC~~ SOLN
0.0000 [IU] | Freq: Three times a day (TID) | SUBCUTANEOUS | Status: DC
  Administered 2013-04-11 – 2013-04-12 (×3): 7 [IU] via SUBCUTANEOUS
  Administered 2013-04-12: 11 [IU] via SUBCUTANEOUS
  Administered 2013-04-13: 7 [IU] via SUBCUTANEOUS
  Administered 2013-04-13 – 2013-04-14 (×2): 4 [IU] via SUBCUTANEOUS

## 2013-04-11 MED ORDER — SODIUM CHLORIDE 0.9 % IJ SOLN: 3.0000 mL | INTRAMUSCULAR | Status: DC | PRN

## 2013-04-11 MED ORDER — LEVETIRACETAM 500 MG PO TABS
1000.0000 mg | ORAL_TABLET | Freq: Two times a day (BID) | ORAL | Status: DC
  Administered 2013-04-11: 500 mg via ORAL
  Administered 2013-04-12 – 2013-04-13 (×3): 1000 mg via ORAL
  Filled 2013-04-11 (×8): qty 2

## 2013-04-11 MED ORDER — FUROSEMIDE 10 MG/ML IJ SOLN
60.0000 mg | Freq: Every day | INTRAMUSCULAR | Status: DC
  Administered 2013-04-11 – 2013-04-12 (×2): 60 mg via INTRAVENOUS
  Filled 2013-04-11 (×2): qty 6

## 2013-04-11 MED ORDER — IPRATROPIUM BROMIDE 0.02 % IN SOLN: 0.5000 mg | RESPIRATORY_TRACT | Status: DC

## 2013-04-11 MED ORDER — LEVOTHYROXINE SODIUM 50 MCG PO TABS
50.0000 ug | ORAL_TABLET | Freq: Every day | ORAL | Status: DC
  Administered 2013-04-12 – 2013-04-13 (×2): 50 ug via ORAL
  Filled 2013-04-11 (×5): qty 1

## 2013-04-11 MED ORDER — ENSURE COMPLETE PO LIQD
237.0000 mL | Freq: Two times a day (BID) | ORAL | Status: DC
  Administered 2013-04-12 (×2): 237 mL via ORAL

## 2013-04-11 MED ORDER — LORAZEPAM 2 MG/ML IJ SOLN
1.0000 mg | Freq: Once | INTRAMUSCULAR | Status: AC
  Administered 2013-04-11: 1 mg via INTRAVENOUS
  Filled 2013-04-11: qty 1

## 2013-04-11 MED ORDER — VANCOMYCIN HCL IN DEXTROSE 1-5 GM/200ML-% IV SOLN
1000.0000 mg | Freq: Once | INTRAVENOUS | Status: AC
  Administered 2013-04-11: 1000 mg via INTRAVENOUS
  Filled 2013-04-11: qty 200

## 2013-04-11 MED ORDER — LEVOFLOXACIN IN D5W 500 MG/100ML IV SOLN
500.0000 mg | Freq: Once | INTRAVENOUS | Status: AC
  Administered 2013-04-11: 500 mg via INTRAVENOUS
  Filled 2013-04-11: qty 100

## 2013-04-11 MED ORDER — AMIODARONE HCL 200 MG PO TABS: 200.0000 mg | ORAL_TABLET | Freq: Every day | ORAL | Status: DC

## 2013-04-11 MED ORDER — ALBUTEROL SULFATE (5 MG/ML) 0.5% IN NEBU
2.5000 mg | INHALATION_SOLUTION | RESPIRATORY_TRACT | Status: DC
  Administered 2013-04-11 (×2): 2.5 mg via RESPIRATORY_TRACT
  Filled 2013-04-11 (×3): qty 0.5

## 2013-04-11 MED ORDER — OXYCODONE-ACETAMINOPHEN 5-325 MG PO TABS
1.0000 | ORAL_TABLET | Freq: Four times a day (QID) | ORAL | Status: DC | PRN
  Administered 2013-04-11: 2 via ORAL
  Administered 2013-04-12: 1 via ORAL
  Administered 2013-04-12 – 2013-04-13 (×5): 2 via ORAL
  Filled 2013-04-11 (×3): qty 2
  Filled 2013-04-11: qty 1
  Filled 2013-04-11 (×2): qty 2
  Filled 2013-04-11: qty 1
  Filled 2013-04-11 (×6): qty 2

## 2013-04-11 MED ORDER — FENTANYL CITRATE 0.05 MG/ML IJ SOLN
50.0000 ug | Freq: Once | INTRAMUSCULAR | Status: AC
  Administered 2013-04-11: 50 ug via INTRAVENOUS
  Filled 2013-04-11: qty 2

## 2013-04-11 MED ORDER — RIVAROXABAN 20 MG PO TABS
20.0000 mg | ORAL_TABLET | Freq: Every day | ORAL | Status: DC
  Administered 2013-04-11 – 2013-04-13 (×3): 20 mg via ORAL
  Filled 2013-04-11 (×4): qty 1

## 2013-04-11 MED ORDER — ALPRAZOLAM 0.5 MG PO TABS
0.5000 mg | ORAL_TABLET | Freq: Every evening | ORAL | Status: DC | PRN
  Administered 2013-04-11 – 2013-04-14 (×3): 0.5 mg via ORAL
  Filled 2013-04-11 (×5): qty 1

## 2013-04-11 MED ORDER — ONDANSETRON HCL 4 MG/2ML IJ SOLN: 4.0000 mg | Freq: Four times a day (QID) | INTRAMUSCULAR | Status: DC | PRN

## 2013-04-11 MED ORDER — IPRATROPIUM BROMIDE 0.02 % IN SOLN
0.5000 mg | RESPIRATORY_TRACT | Status: DC
  Administered 2013-04-11 (×2): 0.5 mg via RESPIRATORY_TRACT
  Filled 2013-04-11 (×3): qty 2.5

## 2013-04-11 MED ORDER — ALBUTEROL SULFATE (5 MG/ML) 0.5% IN NEBU: 2.5000 mg | INHALATION_SOLUTION | RESPIRATORY_TRACT | Status: DC | PRN

## 2013-04-11 NOTE — ED Notes (Signed)
Pt c/o HA, is currently yelling out d/t agitation.

## 2013-04-11 NOTE — Progress Notes (Signed)
Increased patient's FIO2 to 50% venti mask with oxyhen saturations of 93%.

## 2013-04-11 NOTE — H&P (Signed)
[facility] History and Physical  Elizabeth Love ZOX:[id number] DOB: Feb 09, 1956 DOA: 04/11/2013  Referring physician: Dr. Nicanor Alcon PCP: [name], [name], MD  Specialists:   Chief Complaint: SOB  HPI: Elizabeth Love is a 57 y.o. female  With a hx of systolic chf with the last EF noted to be around 10% in 4/14. The patient is currently lethargic, so an accurate history cannot be obtained from the patient. Per report, the patient initially presented to an OSH where a cxr demonstrated vascular congestion vs/ pna. The patient was empirically started on abx and IV lasix was given. The patient was subsequently transferred to [facility] for further care.  Review of Systems:  Unable to obtain from the patient given her level of alertness  Past Medical History  Diagnosis Date  . Seizures   . Hypertension   . Stroke     2007 - R sided weakness and expressive aphasia with h/o behavioral problems related to this  . Asthma   . Diabetes mellitus   . 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 Chronic systolic CHF (congestive heart failure)     a. s/p AICD ~2007 (St. Jude) - previous care [place]. No known hx CAD. b. EF 10% by echo 04/2012.  Marland Kitchen Atrial fibrillation     04/2012 - not felt to be an anticoag candidate long term at that time, but later had DVT so was on placed on Xarelto  . Coagulopathy     "Protein C deficiency"  . History of DVT (deep vein thrombosis)     a. 06/2012: RLE DVT, + protein C deficiency, placed on Xarelto. s/p IVC filter  . Noncompliance     Due to mental status, family has had to coax her to take medicines  . Hypothyroidism   . VT (ventricular tachycardia)     a. h/o ICD ~2007-2008. b. Adm 08/2012 with UTI, had VT/ICD shock x 5 (hypokalemic)  . Protein C deficiency    Past Surgical History  Procedure Laterality Date  . Cholecystectomy    . Appendectomy    . Pacemaker insertion    . Vena cava filter placement  07/19/2012    Procedure:  INSERTION VENA-CAVA FILTER;  Surgeon: Larina Earthly, MD;  Location: [facility] OR;  Service: Vascular;  Laterality: [name];  . Insert / replace / remove pacemaker      ICD  . Abdominal hysterectomy     Social History:  reports that she has quit smoking. She does not have any smokeless tobacco history on file. She reports that she does not drink alcohol or use illicit drugs.  where does patient live--home, ALF, SNF? and with whom if at home?  Can patient participate in ADLs?  Allergies  Allergen Reactions  . Penicillins Rash  . Sulfa Antibiotics Rash    Family History  Problem Relation Age of Onset  . Coronary artery disease Neg Hx   . Hypertension Mother     (be sure to complete)  Prior to Admission medications   Medication Sig Start Date End Date Taking? Authorizing Provider  acetaminophen (TYLENOL) 325 MG tablet Take 650 mg by mouth every 6 (six) hours as needed for pain.    Historical Provider, MD  albuterol (PROVENTIL) (2.5 MG/3ML) 0.083% nebulizer solution Take 2.5 mg by nebulization every 6 (six) hours as needed. For cough or wheeze    Historical Provider, MD  ALPRAZolam Prudy Feeler) 0.5 MG tablet Take 1 tablet (0.5 mg total)  by mouth at bedtime as needed for sleep. For sleep 04/01/13   Gwyneth Sprout, MD  amiodarone (PACERONE) 400 MG tablet Take 0.5 tablets (200 mg total) by mouth daily. 03/25/13   Dolores Patty, MD  aspirin 81 MG EC tablet Take 1 tablet (81 mg total) by mouth daily. Swallow whole. 09/19/12   Roger A Arguello, PA-C  butalbital-acetaminophen-caffeine (FIORICET, ESGIC) [phone] MG per tablet Take 1 tablet by mouth every 4 (four) hours as needed for headache. 01/10/13   Sorin Luanne Bras, MD  digoxin (LANOXIN) 0.125 MG tablet Take 1 tablet (0.125 mg total) by mouth daily. 01/10/13   Sorin Luanne Bras, MD  docusate sodium 100 MG CAPS Take 100 mg by mouth 2 (two) times daily. 01/10/13   Sorin Luanne Bras, MD  feeding supplement (ENSURE COMPLETE) LIQD Take 237 mLs by mouth 2 (two) times daily  between meals. 09/19/12   Roger A Arguello, PA-C  furosemide (LASIX) 40 MG tablet TAKE 1 TABLET TWICE A DAY 03/05/13   Dolores Patty, MD  levETIRAcetam (KEPPRA) 500 MG tablet Take 1,000 mg by mouth 2 (two) times daily.    Historical Provider, MD  levothyroxine (SYNTHROID, LEVOTHROID) 50 MCG tablet Take 50 mcg by mouth daily.    Historical Provider, MD  lisinopril (PRINIVIL,ZESTRIL) 10 MG tablet Take 1 tablet (10 mg total) by mouth daily. 01/31/13   Amy D Filbert Schilder, NP  metolazone (ZAROXOLYN) 2.5 MG tablet Take 1 tablet (2.5 mg total) by mouth as needed. 01/17/13   Amy D Filbert Schilder, NP  metoprolol succinate (TOPROL-XL) 25 MG 24 hr tablet Take 25 mg by mouth 2 (two) times daily.    Historical Provider, MD  omeprazole (PRILOSEC) 20 MG capsule Take 20 mg by mouth 2 (two) times daily.    Historical Provider, MD  oxyCODONE-acetaminophen (PERCOCET) 10-325 MG per tablet Take 1 tablet by mouth every 6 (six) hours as needed for pain. Scheduled per pharmacy 12/03/12   Osvaldo Shipper, MD  oxyCODONE-acetaminophen (PERCOCET/ROXICET) 5-325 MG per tablet Take 1-2 tablets by mouth every 6 (six) hours as needed for pain. 03/14/13   Shelda Jakes, MD  polyethylene glycol [facility] Ethelene Hal) packet Take 17 g by mouth 2 (two) times daily. 01/10/13   Sorin Luanne Bras, MD  potassium chloride SA (K-DUR,KLOR-CON) 20 MEQ tablet Take 20 mEq by mouth 2 (two) times daily.    Historical Provider, MD  Rivaroxaban (XARELTO) 20 MG TABS Take 1 tablet (20 mg total) by mouth daily with supper. 02/26/13   Dolores Patty, MD  spironolactone (ALDACTONE) 25 MG tablet Take 1 tablet (25 mg total) by mouth daily. 03/10/13   Dolores Patty, MD   Physical Exam: Elizabeth Love:   04/11/13 0408 04/11/13 0426 04/11/13 0515 04/11/13 0700  BP: 106/69  118/52 128/64  Pulse: 86 81 82 79  Temp:    98 F (36.7 C)  TempSrc:    Oral  Resp: 39 39 27 34  SpO2: 91% 94% 96% 99%     General:  Lethargic, arouses to painful stimuli, appears older than  stated age  Eyes: PERRL B  ENT: membranes moist, dentition fair  Neck: trachea midline, neck supple  Cardiovascular: tachycardic, s1, s2  Respiratory: increased resp effort, decreased BS throughout, trace end-expiratory wheezing  Abdomen: soft, nondistended  Skin: no abnormal skin lesions seen, normal skin turgor  Musculoskeletal: perfused, no clubbing  Psychiatric: unable to assess given level of alertness  Neurologic: cn2-12 grossly intact  Labs on Admission:  Basic Metabolic  Panel:  Recent Labs Lab 04/11/13 0220  NA 143  K 3.6  CL 104  CO2 20  GLUCOSE 212*  BUN 13  CREATININE 0.80  CALCIUM 9.8   Liver Function Tests:  Recent Labs Lab 04/11/13 0220  AST 30  ALT 16  ALKPHOS 132*  BILITOT 0.9  PROT 8.7*  ALBUMIN 3.6    Recent Labs Lab 04/11/13 0220  LIPASE 15   No results found for this basename: AMMONIA,  in the last 168 hours CBC:  Recent Labs Lab 04/11/13 0220  WBC 13.7*  NEUTROABS 7.9*  HGB 10.2*  HCT 34.8*  MCV 87.7  PLT 254   Cardiac Enzymes:  Recent Labs Lab 04/11/13 0220  TROPONINI <0.30    BNP (last 3 results)  Recent Labs  11/27/12 1821 01/06/13 0154 04/11/13 0220  PROBNP 9596.0* 10506.0* 10700.0*   CBG: No results found for this basename: GLUCAP,  in the last 168 hours  Radiological Exams on Admission: Dg Abd Acute W/chest  04/11/2013   *RADIOLOGY REPORT*  Clinical Data: Chest pain and wheezing.  Abdominal pain and headache.  Shortness of breath.  ACUTE ABDOMEN SERIES (ABDOMEN 2 VIEW & CHEST 1 VIEW)  Comparison: Chest and abdomen 01/06/2013  Findings: Cardiac enlargement with pulmonary vascular congestion and perihilar and interstitial edema.  Findings have progressed since previous study.  Superimposed pneumonia is not excluded.  No blunting of costophrenic angles.  No pneumothorax.  Stable appearance of cardiac pacemaker.  Scattered gas and stool in the colon.  No small or large bowel distension.  No free  intra-abdominal air.  No radiopaque stones. Calcified phleboliths in the pelvis.  Surgical clips in the right upper quadrant.  Inferior vena caval type filter.  Visualized bones appear intact.  No significant change since previous study.  IMPRESSION: Cardiac enlargement with pulmonary vascular congestion and perihilar interstitial edema, demonstrating progression since previous study.  Superimposed pneumonia is not excluded. Nonobstructive bowel gas pattern.   Original Report Authenticated By: Burman Nieves, M.D.    Assessment/Plan Active Problems:   Chronic systolic heart failure   Atrial fibrillation   Chronic pain   DM type 2 (diabetes mellitus, type 2)   Acute systolic chf exacerbation with respiratory failure: - Presenting BNP of 10,700 - Will schedule lasix and monitor i/o and electrolytes closely - Will repeat 2D echo - Cont on ACEI - Presently on ventimask - will continue and wean as tolerated - Admit to stepdown  ?COPD: - Wheezing with decreased BS on exam - For now, will schedule nebs and start IV steroids  Hx Afib: - Mildly tachycardic presently, likely secondary to resp failure - Cont Bblocker and dig per home meds - Cont tele  DM: - On SSI - Will check a1c  Hx MI: - initial cardiac biomarker neg - Cont on tele - Cont xarelto and ASA  DVT prophylaxis: - On xarelto  Code Status: Full (must indicate code status--if unknown or must be presumed, indicate so) Family Communication: Pt in room (indicate person spoken with, if applicable, with phone number if by telephone) Disposition Plan: Pending (indicate anticipated LOS)  Time spent:  CHIU, STEPHEN K [facility] Pager [phone]  If 7PM-7AM, please contact night-coverage [email or web address] Password [facility] 04/11/2013, 7:49 AM

## 2013-04-11 NOTE — Progress Notes (Signed)
  Echocardiogram 2D Echocardiogram has been performed.  [place] Co 04/11/2013, 2:25 PM

## 2013-04-11 NOTE — Progress Notes (Signed)
Placed patient on a ventimask at 6 liters oxygen FIO2 at 30%. Patient oxygen saturations at 93%. RR 40. Will continue to monitor and treat

## 2013-04-11 NOTE — ED Notes (Signed)
Family reports stroke and patient has complete right side is flaccid, per family pt c/o abdominal pain and headache, pt with limited speech secondary stroke

## 2013-04-11 NOTE — Progress Notes (Signed)
PENDING ACCEPTANCE TRANFER NOTE:  Call received from:  Dr Nicanor Alcon  REASON FOR REQUESTING TRANSFER: further tx of CHF and possible concomitant PNA.  HPI: 57 yo with MMP presents with shortness of breath, HA, to [facility].  She has CXR with vascular congestion vs PNA, and was given IV Lasix along with antibiotics.  She is in need to continued diureses.  She is stable for telemetry.   PLAN:  According to telephone report, this patient was accepted for transfer to Telemetry ,   Under [facility] team:  10,  I have requested an order be written to call Flow Manager at [phone] upon patient arrival to the floor for final physician assignment who will do the admission and give admitting orders.  SIGNEDHouston Siren, MD [facility]  04/11/2013, 3:55 AM

## 2013-04-11 NOTE — Progress Notes (Signed)
Pt refused neb-pt wants pain meds RN aware

## 2013-04-11 NOTE — Progress Notes (Signed)
Pt arrival to unit, pt tachypnea rate 30s sustaining. Pt c/o of headache 10/10. No prn available. Paged admitting [facility] physician.

## 2013-04-11 NOTE — Progress Notes (Signed)
Utilization Review Completed.  

## 2013-04-11 NOTE — ED Provider Notes (Signed)
CSN: [id number]     Arrival date & time 04/11/13  0151 History     First MD Initiated Contact with Patient 04/11/13 0204     Chief Complaint  Patient presents with  . Abdominal Pain   (Consider location/radiation/quality/duration/timing/severity/associated sxs/prior Treatment) Patient is a 57 y.o. female presenting with abdominal pain and shortness of breath. The history is provided by a relative. The history is limited by the condition of the patient. No language interpreter was used.  Abdominal Pain This is a recurrent problem. The current episode started 6 to 12 hours ago. The problem occurs constantly. The problem has not changed since onset.Associated symptoms include abdominal pain and shortness of breath. Pertinent negatives include no chest pain and no headaches. Nothing aggravates the symptoms. Nothing relieves the symptoms. She has tried nothing for the symptoms.  Shortness of Breath Severity:  Moderate Onset quality:  Sudden Timing:  Constant Progression:  Unchanged Chronicity:  Recurrent Context: emotional upset   Context: not strong odors and not URI   Relieved by:  Nothing Worsened by:  Nothing tried Associated symptoms: abdominal pain   Associated symptoms: no chest pain and no headaches   Risk factors: no recent surgery   Did not get her meds today including her diuretic  Past Medical History  Diagnosis Date  . Seizures   . Hypertension   . Stroke     2007 - R sided weakness and expressive aphasia with h/o behavioral problems related to this  . Asthma   . Diabetes mellitus   . 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 Chronic systolic CHF (congestive heart failure)     a. s/p AICD ~2007 ([employer]) - previous care [facility]. No known hx CAD. b. EF 10% by echo 04/2012.  Marland Kitchen Atrial fibrillation     04/2012 - not felt to be an anticoag candidate long term at that time, but later had DVT so was on placed on Xarelto  . Coagulopathy     "Protein C deficiency"  .  History of DVT (deep vein thrombosis)     a. 06/2012: RLE DVT, + protein C deficiency, placed on Xarelto. s/p IVC filter  . Noncompliance     Due to mental status, family has had to coax her to take medicines  . Hypothyroidism   . VT (ventricular tachycardia)     a. h/o ICD ~2007-2008. b. Adm 08/2012 with UTI, had VT/ICD shock x 5 (hypokalemic)  . Protein C deficiency    Past Surgical History  Procedure Laterality Date  . Cholecystectomy    . Appendectomy    . Pacemaker insertion    . Vena cava filter placement  07/19/2012    Procedure: INSERTION VENA-CAVA FILTER;  Surgeon: Larina Earthly, MD;  Location: [facility] OR;  Service: Vascular;  Laterality: [name];  . Insert / replace / remove pacemaker      ICD  . Abdominal hysterectomy     Family History  Problem Relation Age of Onset  . Coronary artery disease Neg Hx   . Hypertension Mother    History  Substance Use Topics  . Smoking status: Former Games developer  . Smokeless tobacco: Not on file  . Alcohol Use: No   OB History   Grav Para Term Preterm Abortions TAB SAB Ect Mult Living                 Review of Systems  Unable to perform ROS Respiratory: Positive for shortness of breath.  Cardiovascular: Negative for chest pain.  Gastrointestinal: Positive for abdominal pain.  Neurological: Negative for headaches.    Allergies  Penicillins and Sulfa antibiotics  Home Medications   Current Outpatient Rx  Name  Route  Sig  Dispense  Refill  . acetaminophen (TYLENOL) 325 MG tablet   Oral   Take 650 mg by mouth every 6 (six) hours as needed for pain.         Marland Kitchen albuterol (PROVENTIL) (2.5 MG/3ML) 0.083% nebulizer solution   Nebulization   Take 2.5 mg by nebulization every 6 (six) hours as needed. For cough or wheeze         . ALPRAZolam (XANAX) 0.5 MG tablet   Oral   Take 1 tablet (0.5 mg total) by mouth at bedtime as needed for sleep. For sleep   15 tablet   0   . amiodarone (PACERONE) 400 MG tablet   Oral   Take 0.5 tablets  (200 mg total) by mouth daily.   15 tablet   3   . aspirin 81 MG EC tablet   Oral   Take 1 tablet (81 mg total) by mouth daily. Swallow whole.   30 tablet   12   . butalbital-acetaminophen-caffeine (FIORICET, ESGIC) 50-325-40 MG per tablet   Oral   Take 1 tablet by mouth every 4 (four) hours as needed for headache.   14 tablet   0   . digoxin (LANOXIN) 0.125 MG tablet   Oral   Take 1 tablet (0.125 mg total) by mouth daily.   30 tablet   0   . docusate sodium 100 MG CAPS   Oral   Take 100 mg by mouth 2 (two) times daily.   10 capsule      . feeding supplement (ENSURE COMPLETE) LIQD   Oral   Take 237 mLs by mouth 2 (two) times daily between meals.         . furosemide (LASIX) 40 MG tablet      TAKE 1 TABLET TWICE A DAY   60 tablet   6   . levETIRAcetam (KEPPRA) 500 MG tablet   Oral   Take 1,000 mg by mouth 2 (two) times daily.         Marland Kitchen levothyroxine (SYNTHROID, LEVOTHROID) 50 MCG tablet   Oral   Take 50 mcg by mouth daily.         Marland Kitchen lisinopril (PRINIVIL,ZESTRIL) 10 MG tablet   Oral   Take 1 tablet (10 mg total) by mouth daily.   30 tablet   3   . metolazone (ZAROXOLYN) 2.5 MG tablet   Oral   Take 1 tablet (2.5 mg total) by mouth as needed.   10 tablet   3   . metoprolol succinate (TOPROL-XL) 25 MG 24 hr tablet   Oral   Take 25 mg by mouth 2 (two) times daily.         Marland Kitchen omeprazole (PRILOSEC) 20 MG capsule   Oral   Take 20 mg by mouth 2 (two) times daily.         Marland Kitchen oxyCODONE-acetaminophen (PERCOCET) 10-325 MG per tablet   Oral   Take 1 tablet by mouth every 6 (six) hours as needed for pain. Scheduled per pharmacy   30 tablet   0   . oxyCODONE-acetaminophen (PERCOCET/ROXICET) 5-325 MG per tablet   Oral   Take 1-2 tablets by mouth every 6 (six) hours as needed for pain.   15 tablet   0   .  polyethylene glycol (MIRALAX / GLYCOLAX) packet   Oral   Take 17 g by mouth 2 (two) times daily.   14 each      . potassium chloride SA  (K-DUR,KLOR-CON) 20 MEQ tablet   Oral   Take 20 mEq by mouth 2 (two) times daily.         . Rivaroxaban (XARELTO) 20 MG TABS   Oral   Take 1 tablet (20 mg total) by mouth daily with supper.   30 tablet   6   . spironolactone (ALDACTONE) 25 MG tablet   Oral   Take 1 tablet (25 mg total) by mouth daily.   30 tablet   3    BP 145/102  Pulse 88  Resp 26 Physical Exam  Constitutional: She appears well-developed and well-nourished.  HENT:  Head: Normocephalic and atraumatic.  Mouth/Throat: Oropharynx is clear and moist.  Eyes: Conjunctivae are normal. Pupils are equal, round, and reactive to light.  Neck: Normal range of motion. Neck supple.  Cardiovascular: Regular rhythm and intact distal pulses.  Tachycardia present.   Pulmonary/Chest: No stridor. Tachypnea noted. She has wheezes.  Abdominal: Soft. Bowel sounds are normal. There is no tenderness. There is no rebound and no guarding.  Musculoskeletal: Normal range of motion.  Neurological: She is alert.  Flaccid right side intact motor on left side  Skin: Skin is warm and dry.  Psychiatric: She is agitated.    ED Course   Procedures (including critical care time)  Labs Reviewed  CBC WITH DIFFERENTIAL  COMPREHENSIVE METABOLIC PANEL  LIPASE, BLOOD  URINALYSIS, ROUTINE W REFLEX MICROSCOPIC  PRO B NATRIURETIC PEPTIDE  TROPONIN I  DIGOXIN LEVEL   No results found. No diagnosis found.  MDM   Date: 04/11/2013  Rate: 105  Rhythm: sinus tachycardia  QRS Axis: normal  Intervals: PR prolonged  ST/T Wave abnormalities: nonspecific ST changes  Conduction Disutrbances:first-degree A-V block   Narrative Interpretation:   Old EKG Reviewed: changes noted   Results for orders placed during the hospital encounter of 04/11/13  CBC WITH DIFFERENTIAL      Result Value Range   WBC 13.7 (*) 4.0 - 10.5 K/uL   RBC 3.97  3.87 - 5.11 MIL/uL   Hemoglobin 10.2 (*) 12.0 - 15.0 g/dL   HCT 29.5 (*) 62.1 - 30.8 %   MCV 87.7  78.0  - 100.0 fL   MCH 25.7 (*) 26.0 - 34.0 pg   MCHC 29.3 (*) 30.0 - 36.0 g/dL   RDW 65.7 (*) 84.6 - 96.2 %   Platelets 254  150 - 400 K/uL   Neutrophils Relative % 57  43 - 77 %   Neutro Abs 7.9 (*) 1.7 - 7.7 K/uL   Lymphocytes Relative 30  12 - 46 %   Lymphs Abs 4.1 (*) 0.7 - 4.0 K/uL   Monocytes Relative 12  3 - 12 %   Monocytes Absolute 1.6 (*) 0.1 - 1.0 K/uL   Eosinophils Relative 1  0 - 5 %   Eosinophils Absolute 0.1  0.0 - 0.7 K/uL   Basophils Relative 0  0 - 1 %   Basophils Absolute 0.0  0.0 - 0.1 K/uL   WBC Morphology VACUOLATED NEUTROPHILS     RBC Morphology ELLIPTOCYTES    COMPREHENSIVE METABOLIC PANEL      Result Value Range   Sodium 143  135 - 145 mEq/L   Potassium 3.6  3.5 - 5.1 mEq/L   Chloride 104  96 - 112 mEq/L  CO2 20  19 - 32 mEq/L   Glucose, Bld 212 (*) 70 - 99 mg/dL   BUN 13  6 - 23 mg/dL   Creatinine, Ser 1.61  0.50 - 1.10 mg/dL   Calcium 9.8  8.4 - 09.6 mg/dL   Total Protein 8.7 (*) 6.0 - 8.3 g/dL   Albumin 3.6  3.5 - 5.2 g/dL   AST 30  0 - 37 U/L   ALT 16  0 - 35 U/L   Alkaline Phosphatase 132 (*) 39 - 117 U/L   Total Bilirubin 0.9  0.3 - 1.2 mg/dL   GFR calc non Af Amer 82 (*) >90 mL/min   GFR calc Af Amer >90  >90 mL/min  LIPASE, BLOOD      Result Value Range   Lipase 15  11 - 59 U/L  PRO B NATRIURETIC PEPTIDE      Result Value Range   Pro B Natriuretic peptide (BNP) 10700.0 (*) 0 - 125 pg/mL  TROPONIN I      Result Value Range   Troponin I <0.30  <0.30 ng/mL  DIGOXIN LEVEL      Result Value Range   Digoxin Level 0.8  0.8 - 2.0 ng/mL   Dg Abd Acute W/chest  04/11/2013   *RADIOLOGY REPORT*  Clinical Data: Chest pain and wheezing.  Abdominal pain and headache.  Shortness of breath.  ACUTE ABDOMEN SERIES (ABDOMEN 2 VIEW & CHEST 1 VIEW)  Comparison: Chest and abdomen 01/06/2013  Findings: Cardiac enlargement with pulmonary vascular congestion and perihilar and interstitial edema.  Findings have progressed since previous study.  Superimposed  pneumonia is not excluded.  No blunting of costophrenic angles.  No pneumothorax.  Stable appearance of cardiac pacemaker.  Scattered gas and stool in the colon.  No small or large bowel distension.  No free intra-abdominal air.  No radiopaque stones. Calcified phleboliths in the pelvis.  Surgical clips in the right upper quadrant.  Inferior vena caval type filter.  Visualized bones appear intact.  No significant change since previous study.  IMPRESSION: Cardiac enlargement with pulmonary vascular congestion and perihilar interstitial edema, demonstrating progression since previous study.  Superimposed pneumonia is not excluded. Nonobstructive bowel gas pattern.   Original Report Authenticated By: Burman Nieves, M.D.    MDM Reviewed: previous chart, nursing note and vitals Reviewed previous: labs, ECG and x-ray Interpretation: labs, ECG and x-ray (chf by my read) Consults: admitting MD   Medications  vancomycin (VANCOCIN) IVPB 1000 mg/200 mL premix (not administered)  levofloxacin (LEVAQUIN) IVPB 500 mg (not administered)  fentaNYL (SUBLIMAZE) injection 50 mcg (50 mcg Intravenous Given 04/11/13 0224)  albuterol (PROVENTIL) (5 MG/ML) 0.5% nebulizer solution 5 mg (5 mg Nebulization Given 04/11/13 0227)  LORazepam (ATIVAN) injection 1 mg (1 mg Intravenous Given 04/11/13 0318)  furosemide (LASIX) injection 40 mg (40 mg Intravenous Given 04/11/13 0319)     CRITICAL CARE Performed by: Jasmine Awe Total critical care time: 60 minutes  Critical care time was exclusive of separately billable procedures and treating other patients. Critical care was necessary to treat or prevent imminent or life-threatening deterioration. Critical care was time spent personally by me on the following activities: development of treatment plan with patient and/or surrogate as well as nursing, discussions with consultants, evaluation of patient's response to treatment, examination of patient, obtaining history  from patient or surrogate, ordering and performing treatments and interventions, ordering and review of laboratory studies, ordering and review of radiographic studies, pulse oximetry and re-evaluation of patient's condition.  Suspect abdominal pain is due to constipation from pain medication.    Jasmine Awe, MD 04/11/13 [phone]

## 2013-04-11 NOTE — ED Notes (Signed)
Per pt's family - pt began c/o HA and abd pain today - pt has chronic rt-sided pain and HAs d/t stroke. 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 And audible atelectasis. Pt immediately placed on cardiac monitor and continuous pulse ox, IV obtained. Pt denies any constipation, states she has had x3 loose BMs today, denies n/v. Pt speaking only one word at a time between breaths.

## 2013-04-11 NOTE — Progress Notes (Signed)
7/25  Patient has history of diabetes.  Recommend check CBGs AC & HS and check HgbA1C.  Last A1C was 6.3% February, 2014. Smith Mince RN BSN CDE

## 2013-04-12 ENCOUNTER — Inpatient Hospital Stay (HOSPITAL_COMMUNITY): Payer: Medicare Other

## 2013-04-12 DIAGNOSIS — I428 Other cardiomyopathies: Secondary | ICD-10-CM

## 2013-04-12 LAB — GLUCOSE, CAPILLARY
Glucose-Capillary: 180 mg/dL — ABNORMAL HIGH (ref 70–99)
Glucose-Capillary: 263 mg/dL — ABNORMAL HIGH (ref 70–99)
Glucose-Capillary: 243 mg/dL — ABNORMAL HIGH (ref 70–99)
Glucose-Capillary: 237 mg/dL — ABNORMAL HIGH (ref 70–99)

## 2013-04-12 LAB — BASIC METABOLIC PANEL
CO2: 24 mEq/L (ref 19–32)
Calcium: 9.1 mg/dL (ref 8.4–10.5)
GFR calc non Af Amer: 65 mL/min — ABNORMAL LOW (ref >90)
Glucose, Bld: 255 mg/dL — ABNORMAL HIGH (ref 70–99)
Potassium: 3.9 mEq/L (ref 3.5–5.1)
Sodium: 138 mEq/L (ref 135–145)

## 2013-04-12 MED ORDER — MORPHINE SULFATE 2 MG/ML IJ SOLN
2.0000 mg | Freq: Once | INTRAMUSCULAR | Status: AC
  Administered 2013-04-12: 2 mg via INTRAVENOUS
  Filled 2013-04-12: qty 1

## 2013-04-12 MED ORDER — HYDROMORPHONE HCL PF 1 MG/ML IJ SOLN
0.5000 mg | Freq: Once | INTRAMUSCULAR | Status: AC
  Administered 2013-04-12: 0.5 mg via INTRAVENOUS
  Filled 2013-04-12: qty 1

## 2013-04-12 MED ORDER — ALBUTEROL SULFATE (5 MG/ML) 0.5% IN NEBU: 2.5000 mg | INHALATION_SOLUTION | RESPIRATORY_TRACT | Status: DC | PRN

## 2013-04-12 MED ORDER — IPRATROPIUM BROMIDE 0.02 % IN SOLN: 0.5000 mg | RESPIRATORY_TRACT | Status: DC | PRN

## 2013-04-12 NOTE — Progress Notes (Signed)
Patient crying and complaining of 10/10 right sided pain. PO PRN pain medication given. Patient stated pain was 10/10 upon reassessment. Patient requesting IV pain medication. Dr. Rhona Leavens notified, and made aware of patient's request. Verbal orders given to continue current PO PRN pain medication with no new orders for IV pain medication.

## 2013-04-12 NOTE — Progress Notes (Signed)
Patient complained of "burning headache."  PO PRN pain medication taken to bedside, informed patient of medication available. Patient refused to take medication by saying "no" and shaking head.

## 2013-04-12 NOTE — Progress Notes (Signed)
F/u cxr demonstrates improvement in pulmonary edema, correlating to pt's improvement in O2 requirement. Will cont as planned for now.

## 2013-04-12 NOTE — Care Management Note (Unsigned)
    Page 1 of 1   04/12/2013     9:32:20 AM   CARE MANAGEMENT NOTE 04/12/2013  Patient:  Elizabeth Love, Elizabeth Love   Account Number:  [id number]  Date Initiated:  04/11/2013  Documentation initiated by:  Alvira Philips Assessment:   57 yr-old female adm with dx of systolic failure; lives with brother, has aide through [facility], h/o home health services through [facility]     Action/Plan:   Anticipated DC Date:     Anticipated DC Plan:        DC Planning Services  CM consult      Choice offered to / List presented to:             Status of service:  In process, will continue to follow Medicare Important Message given?   (If response is "NO", the following Medicare IM given date fields will be blank) Date Medicare IM given:   Date Additional Medicare IM given:    Discharge Disposition:    Per UR Regulation:  Reviewed for med. necessity/level of care/duration of stay  If discussed at Long Length of Stay Meetings, dates discussed:    Comments:  PCP:  Dr Charlynn Court  Contact:  Jalin Erpelding, brother  [phone]   04-12-13 0930 Roselyn Bering- Bigelow,RN,BSN [phone] CM did speak to pt this am and CM placed  PT/OT orders in system. Per RN pt has r sided weakness. CM will continue to monitor for disposition needs.  04/11/13 1507 Henrietta Mayo RN MSN BSN CCM Received consult to screen for home health need, pt sleeping, no family in room.

## 2013-04-12 NOTE — Progress Notes (Addendum)
Pt. C/o 10/10 HA pain, NP Callahan notified, meds ordered for pain relief, pt. Still states 8/10 pain after 0.5 dilaudid. Pt. Resting in room, watching TV with the volume loud, encouraged to turn off tv and rest, when awakened by RN pt. Cries due to HA pain. Will continue to monitor. Jani Gravel

## 2013-04-12 NOTE — Progress Notes (Signed)
[facility] PROGRESS NOTE  Elizabeth Love YNW:[id number] DOB: 1956-04-11 DOA: 04/11/2013 PCP: [name], [name], MD  Assessment/Plan: Acute systolic chf exacerbation with respiratory failure:  - Presenting BNP of 10,700 with repeat this AM of over 20,000 - Cont lasix and monitor i/o and electrolytes closely  - Repeat 2D echo with EF of 10% - Cont on ACEI  - Have weaned O2 down to 3L [place] - on RA when examined this AM - Will repeat cxr and if there is evidence of worsening infiltrates, consider Heart Failure consult.  ?COPD:  - Stable. No wheezing - For now, will schedule nebs and wean steroids  Hx Afib:  - Cont Bblocker and dig per home meds  - Cont tele  - s/p pacemaker placement DM:  - On SSI   Hx MI:  - initial cardiac biomarker neg  - Cont on tele  - Cont xarelto and ASA  DVT prophylaxis:  - On xarelto HX of CVA with Protein C deficiency - On xarelto as above  Code Status: Full Family Communication: Pt in room (indicate person spoken with, relationship, and if by phone, the number) Disposition Plan: Pending  Procedures:  2D Echo 04/11/13 - EF 10%  HPI/Subjective: No acute events. Pt claims to still be SOB.  Objective: Filed Vitals:   04/12/13 0000 04/12/13 0009 04/12/13 0411 04/12/13 0900  BP: 115/73 115/73 121/78   Pulse: 73 76 73   Temp:  98.2 F (36.8 C) 98.2 F (36.8 C) 98.9 F (37.2 C)  TempSrc:  Oral Oral Oral  Resp: 26 23 23    Weight:   52 kg (114 lb 10.2 oz)   SpO2: 100% 100% 100%     Intake/Output Summary (Last 24 hours) at 04/12/13 1038 Last data filed at 04/12/13 0412  Gross per 24 hour  Intake    490 ml  Output   1425 ml  Net   -935 ml   Filed Weights   04/12/13 0411  Weight: 52 kg (114 lb 10.2 oz)    Exam:   General:  Awake, in nad  Cardiovascular: regular, s1, s2  Respiratory: normal resp effort, no wheezing  Abdomen: soft, nondistended       Musculoskeletal: Perfused, no clubbing or cyanosis   Data  Reviewed: Basic Metabolic Panel:  Recent Labs Lab 04/11/13 0220 04/11/13 1050 04/12/13 0500  NA 143  --  138  K 3.6  --  3.9  CL 104  --  102  CO2 20  --  24  GLUCOSE 212*  --  255*  BUN 13  --  21  CREATININE 0.80 0.82 0.97  CALCIUM 9.8  --  9.1   Liver Function Tests:  Recent Labs Lab 04/11/13 0220  AST 30  ALT 16  ALKPHOS 132*  BILITOT 0.9  PROT 8.7*  ALBUMIN 3.6    Recent Labs Lab 04/11/13 0220  LIPASE 15   No results found for this basename: AMMONIA,  in the last 168 hours CBC:  Recent Labs Lab 04/11/13 0220 04/11/13 1050  WBC 13.7* 13.3*  NEUTROABS 7.9*  --   HGB 10.2* 8.8*  HCT 34.8* 29.3*  MCV 87.7 84.4  PLT 254 218   Cardiac Enzymes:  Recent Labs Lab 04/11/13 0220  TROPONINI <0.30   BNP (last 3 results)  Recent Labs  01/06/13 0154 04/11/13 0220 04/12/13 0500  PROBNP 10506.0* 10700.0* 21955.0*   CBG:  Recent Labs Lab 04/11/13 6578 04/11/13 1243 04/11/13 1705 04/11/13 2205 04/12/13 0825  GLUCAP 185* 180* 204* 156* 263*    Recent Results (from the past 240 hour(s))  MRSA PCR SCREENING     Status: None   Collection Time    04/11/13  6:59 AM      Result Value Range Status   MRSA by PCR NEGATIVE  NEGATIVE Final   Comment:            The GeneXpert MRSA Assay (FDA     approved for NASAL specimens     only), is one component of a     comprehensive MRSA colonization     surveillance program. It is not     intended to diagnose MRSA     infection nor to guide or     monitor treatment for     MRSA infections.     Studies: Dg Abd Acute W/chest  04/11/2013   *RADIOLOGY REPORT*  Clinical Data: Chest pain and wheezing.  Abdominal pain and headache.  Shortness of breath.  ACUTE ABDOMEN SERIES (ABDOMEN 2 VIEW & CHEST 1 VIEW)  Comparison: Chest and abdomen 01/06/2013  Findings: Cardiac enlargement with pulmonary vascular congestion and perihilar and interstitial edema.  Findings have progressed since previous study.  Superimposed  pneumonia is not excluded.  No blunting of costophrenic angles.  No pneumothorax.  Stable appearance of cardiac pacemaker.  Scattered gas and stool in the colon.  No small or large bowel distension.  No free intra-abdominal air.  No radiopaque stones. Calcified phleboliths in the pelvis.  Surgical clips in the right upper quadrant.  Inferior vena caval type filter.  Visualized bones appear intact.  No significant change since previous study.  IMPRESSION: Cardiac enlargement with pulmonary vascular congestion and perihilar interstitial edema, demonstrating progression since previous study.  Superimposed pneumonia is not excluded. Nonobstructive bowel gas pattern.   Original Report Authenticated By: Burman Nieves, M.D.    Scheduled Meds: . ipratropium  0.5 mg Nebulization BID   And  . albuterol  2.5 mg Nebulization BID  . amiodarone  200 mg Oral Daily  . aspirin EC  81 mg Oral Daily  . digoxin  0.125 mg Oral Daily  . docusate sodium  100 mg Oral BID  . feeding supplement  237 mL Oral BID BM  . furosemide  60 mg Intravenous Daily  . insulin aspart  0-20 Units Subcutaneous TID WC  . insulin aspart  0-5 Units Subcutaneous QHS  . levETIRAcetam  1,000 mg Oral BID  . levothyroxine  50 mcg Oral QAC breakfast  . lisinopril  10 mg Oral Daily  . methylPREDNISolone (SOLU-MEDROL) injection  60 mg Intravenous Q12H  . metoprolol succinate  25 mg Oral BID  . pantoprazole  40 mg Oral Daily  . polyethylene glycol  17 g Oral BID  . potassium chloride SA  20 mEq Oral BID  . Rivaroxaban  20 mg Oral Q supper  . sodium chloride  3 mL Intravenous Q12H  . spironolactone  25 mg Oral Daily   Continuous Infusions:   Active Problems:   Chronic systolic heart failure   Atrial fibrillation   Narcotic abuse   Chronic pain   DM type 2 (diabetes mellitus, type 2)    Time spent:    Seara Hinesley K  [facility] Pager [phone]. If 7PM-7AM, please contact night-coverage at [email or web address], password  [facility] 04/12/2013, 10:38 AM  LOS: 1 day

## 2013-04-13 DIAGNOSIS — I69920 Aphasia following unspecified cerebrovascular disease: Secondary | ICD-10-CM

## 2013-04-13 LAB — BASIC METABOLIC PANEL
BUN: 34 mg/dL — ABNORMAL HIGH (ref 6–23)
Calcium: 9.5 mg/dL (ref 8.4–10.5)
Chloride: 99 mEq/L (ref 96–112)
Creatinine, Ser: 1.09 mg/dL (ref 0.50–1.10)
GFR calc Af Amer: 65 mL/min — ABNORMAL LOW (ref >90)
GFR calc non Af Amer: 56 mL/min — ABNORMAL LOW (ref >90)

## 2013-04-13 LAB — GLUCOSE, CAPILLARY
Glucose-Capillary: 237 mg/dL — ABNORMAL HIGH (ref 70–99)
Glucose-Capillary: 89 mg/dL (ref 70–99)

## 2013-04-13 MED ORDER — FUROSEMIDE 40 MG PO TABS: 40.0000 mg | ORAL_TABLET | Freq: Every day | ORAL | Status: DC

## 2013-04-13 MED ORDER — FUROSEMIDE 40 MG PO TABS
40.0000 mg | ORAL_TABLET | Freq: Two times a day (BID) | ORAL | Status: DC
  Administered 2013-04-13: 40 mg via ORAL
  Filled 2013-04-13 (×5): qty 1

## 2013-04-13 MED ORDER — METOPROLOL SUCCINATE ER 25 MG PO TB24
25.0000 mg | ORAL_TABLET | Freq: Every day | ORAL | Status: DC
  Filled 2013-04-13 (×2): qty 1

## 2013-04-13 MED ORDER — METOLAZONE 2.5 MG PO TABS
2.5000 mg | ORAL_TABLET | Freq: Every day | ORAL | Status: DC
  Administered 2013-04-14: 2.5 mg via ORAL
  Filled 2013-04-13 (×2): qty 1

## 2013-04-13 MED ORDER — ALPRAZOLAM 0.5 MG PO TABS: 0.5000 mg | ORAL_TABLET | Freq: Every day | ORAL | Status: DC | PRN

## 2013-04-13 MED ORDER — FUROSEMIDE 40 MG PO TABS
40.0000 mg | ORAL_TABLET | Freq: Two times a day (BID) | ORAL | Status: DC
  Filled 2013-04-13 (×2): qty 1

## 2013-04-13 NOTE — Progress Notes (Signed)
Pt refused scheduled med and also c/o H/A tylenol given refused.  Checked BS on arrival to floor greater than 200 as pt has refused coverage from previous floor.  Checked on pt resting quietly with eyes closed.  Dr, Rhona Leavens informed via text.  Amanda Pea, [name].

## 2013-04-13 NOTE — Evaluation (Signed)
Physical Therapy Evaluation Patient Details Name: Elizabeth Love MRN: [id number] DOB: [birth year] Today's Date: 04/13/2013 Time: 1191-4782 PT Time Calculation (min): 39 min  PT Assessment / Plan / Recommendation History of Present Illness  AARA JACQUOT is a 57 y.o. female With a hx of systolic chf with the last EF noted to be around 10% in 4/14. The patient is currently lethargic, so an accurate history cannot be obtained from the patient. Per report, the patient initially presented to an OSH where a cxr demonstrated vascular congestion vs/ pna.  Clinical Impression  Very limited evaluation due to pt emotional state and constantly crying saying "My head hurts so bad.".  PT entered room while pt sitting on 3n1 with RN present.  PT relieved RN so RN can assist another pt.  Pt sat on 3n1 for ~10 minutes crying "My head hurts" (RN has offered Percocet multiple times and pt continues to refuse and wants IV meds) as PT constantly asked pt "Can I help you with anything" "Are you ready to get back in bed?" Pt would not respond but continue to point to things she need such as tissue paper.  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  Unable to get PLOF or amount of caregiver support/assistance however from previous admission pt modified independent with quad cane.  Pt will benefit from acute PT services to improve overall mobility to prepare for safe d/c home.  Will continue to assess pt's caregiver support and pt's progress to determine if change of disposition is required.  Also recommend Psych consult if MD agrees   Pt was able to complete ADLs with set up such as toilet hygiene and donning socks.     PT Assessment  Patient needs continued PT services    Follow Up Recommendations  Home health PT;Supervision/Assistance - 24 hour, Psych Consult if MD agrees     Equipment Recommendations  None recommended by PT    Frequency  Min 3X/week    Precautions / Restrictions Precautions Precautions: Fall Restrictions Weight Bearing Restrictions: No   Pertinent Vitals/Pain Will not rate but continues to cry out in pain due to headache; RN has offered pain meds multiple times and pt continues to refuse; pt wants IV meds       Mobility  Bed Mobility Bed Mobility: Not assessed (Pt sitting on 3n1 when entering with RN) Transfers Transfers: Sit to Stand;Stand to Sit;Stand Pivot Transfers Sit to Stand: 4: Min assist;From chair/3-in-1 Stand to Sit: To bed;4: Min Multimedia programmer Transfers: 4: Min assist Details for Transfer Assistance: (A) to initiate transfer and slowly descend to bed with max cues for technique Ambulation/Gait Ambulation/Gait Assistance: Not tested (comment) Modified Rankin (Stroke Patients Only) Modified Rankin: Moderately severe disability    Exercises     PT Diagnosis: Abnormality of gait;Generalized weakness;Acute pain  PT Problem List: Decreased strength;Decreased activity tolerance;Decreased range of motion;Decreased balance;Decreased mobility;Decreased coordination;Decreased cognition;Decreased knowledge of use of DME;Decreased safety awareness;Pain;Impaired tone PT Treatment Interventions: DME instruction;Gait training;Functional mobility training;Therapeutic activities;Therapeutic exercise;Balance training;Neuromuscular re-education;Patient/family education     PT Goals(Current goals can be found in the care plan section) Acute Rehab PT Goals Patient Stated Goal: Did not set PT Goal Formulation: With patient Time For Goal Achievement: 04/20/13 Potential to Achieve Goals: Good  Visit Information  Last PT Received On: 04/13/13 Assistance Needed: +1 History of Present Illness: HARVEY LINGO is a 57 y.o. female With a hx  of systolic chf with the last EF noted to be around 10% in 4/14. The patient is currently lethargic, so an accurate history cannot be obtained from the patient. Per  report, the patient initially presented to an OSH where a cxr demonstrated vascular congestion vs/ pna.       Prior Functioning  Home Living Family/patient expects to be discharged to:: Private residence Living Arrangements: Other relatives Type of Home: House Home Access: Level entry Home Layout: Two level;Bed/bath upstairs Alternate Level Stairs-Number of Steps: flight Alternate Level Stairs-Rails: Right Home Equipment: Bedside commode;Cane - quad;Wheelchair - manual;Shower seat Additional Comments: Pt able to speak out.  However pt cried the whole session saying "My head hurts so bad." but pt would not speak to PT Prior Function Comments: Pt able to speak out.  However pt cried the whole session saying "My head hurts so bad." but pt would not speak to PT Communication Communication: Expressive difficulties;Receptive difficulties (Hx of expressive) Dominant Hand: Left    Cognition  Cognition Arousal/Alertness: Awake/alert Behavior During Therapy:  (emotional) Overall Cognitive Status: Difficult to assess Difficult to assess due to:  (Pt crying the entire session)    Extremity/Trunk Assessment Upper Extremity Assessment Upper Extremity Assessment: RUE deficits/detail RUE Deficits / Details: Pt with hx of stroke with right sided weakness with flexor tone.  Unable to assess Lower Extremity Assessment Lower Extremity Assessment: RLE deficits/detail RLE Deficits / Details: Hx of right sided weakness due to stroke.  Unable to fully assess due to pt emotional state   Balance Balance Balance Assessed: Yes Static Sitting Balance Static Sitting - Balance Support: Feet supported Static Sitting - Level of Assistance: 7: Independent  End of Session PT - End of Session Activity Tolerance: Patient limited by pain Patient left: in bed;with call bell/phone within reach;with bed alarm set (Sitting EOB) Nurse Communication: Mobility status;Other (comment) (Pt sitting EOB and want get back in  bed.  )  GP     Jasline Buskirk 04/13/2013, 4:58 PM  Jake Shark, PT DPT [phone]

## 2013-04-13 NOTE — Progress Notes (Signed)
Pt arrived to floor via w/c A&0x3.  Oriented to surroundings.  Nod and sign language  Form of communicating, pt with expressive aphagia.  Denies pain and discomfort.  Amanda Pea, [name].

## 2013-04-13 NOTE — Progress Notes (Signed)
[facility] PROGRESS NOTE  Elizabeth Love:[id number] DOB: 11-15-1955 DOA: 04/11/2013 PCP: Estill Cotta, MD  Assessment/Plan: Acute systolic chf exacerbation with respiratory failure:  - BUN trending up. Will change from IV lasix to 40mg  po lasix - Repeat 2D echo with EF of 10%  - Cont on ACEI  - On RA with o2 sats of 100%  when examined this AM  - Will repeat cxr and BNP in the AM and if there is evidence of worsening chf, consider Heart Failure consult.   ?COPD:  - Stable. No wheezing  - For now, will schedule nebs and wean steroids  Hx Afib:  - Cont Bblocker and dig per home meds  - Cont tele  - s/p pacemaker placement  DM:  - On SSI  Hx MI:  - initial cardiac biomarker neg  - Cont on tele  - Cont xarelto and ASA  DVT prophylaxis:  - On xarelto  HX of CVA with Protein C deficiency  - On xarelto as above  Code Status: Full Family Communication: Pt's son via phone (indicate person spoken with, relationship, and if by phone, the number) Disposition Plan: Pending   HPI/Subjective: No complaints this AM. Pt was seen with pizza at bedside, given to her by her family. Advised to avoid foods with high sodium content.  Objective: Filed Vitals:   04/12/13 2016 04/13/13 0040 04/13/13 0426 04/13/13 0742  BP: 147/71 113/53 123/91   Pulse: 67 61 66   Temp: 98.3 F (36.8 C) 98.5 F (36.9 C) 98.3 F (36.8 C) 98.7 F (37.1 C)  TempSrc: Oral Oral Oral Oral  Resp: 31 21 21    Height:      Weight:  52 kg (114 lb 10.2 oz)    SpO2:  100% 99%     Intake/Output Summary (Last 24 hours) at 04/13/13 0910 Last data filed at 04/13/13 0427  Gross per 24 hour  Intake   1758 ml  Output    750 ml  Net   1008 ml   Filed Weights   04/12/13 0411 04/13/13 0040  Weight: 52 kg (114 lb 10.2 oz) 52 kg (114 lb 10.2 oz)    Exam:   General:  Awake, in nad  Cardiovascular: regular, s1, s2  Respiratory: normal resp effort, no wheezing or crackles  Abdomen: soft,  nondistended  Musculoskeletal: perfused, no clubbing   Data Reviewed: Basic Metabolic Panel:  Recent Labs Lab 04/11/13 0220 04/11/13 1050 04/12/13 0500 04/13/13 0410  NA 143  --  138 134*  K 3.6  --  3.9 4.3  CL 104  --  102 99  CO2 20  --  24 27  GLUCOSE 212*  --  255* 212*  BUN 13  --  21 34*  CREATININE 0.80 0.82 0.97 1.09  CALCIUM 9.8  --  9.1 9.5   Liver Function Tests:  Recent Labs Lab 04/11/13 0220  AST 30  ALT 16  ALKPHOS 132*  BILITOT 0.9  PROT 8.7*  ALBUMIN 3.6    Recent Labs Lab 04/11/13 0220  LIPASE 15   No results found for this basename: AMMONIA,  in the last 168 hours CBC:  Recent Labs Lab 04/11/13 0220 04/11/13 1050  WBC 13.7* 13.3*  NEUTROABS 7.9*  --   HGB 10.2* 8.8*  HCT 34.8* 29.3*  MCV 87.7 84.4  PLT 254 218   Cardiac Enzymes:  Recent Labs Lab 04/11/13 0220  TROPONINI <0.30   BNP (last 3 results)  Recent  Labs  01/06/13 0154 04/11/13 0220 04/12/13 0500  PROBNP 10506.0* 10700.0* 21955.0*   CBG:  Recent Labs Lab 04/12/13 0825 04/12/13 1152 04/12/13 1657 04/12/13 2109 04/13/13 0807  GLUCAP 263* 243* 237* 239* 175*    Recent Results (from the past 240 hour(s))  MRSA PCR SCREENING     Status: None   Collection Time    04/11/13  6:59 AM      Result Value Range Status   MRSA by PCR NEGATIVE  NEGATIVE Final   Comment:            The GeneXpert MRSA Assay (FDA     approved for NASAL specimens     only), is one component of a     comprehensive MRSA colonization     surveillance program. It is not     intended to diagnose MRSA     infection nor to guide or     monitor treatment for     MRSA infections.     Studies: Dg Chest Port 1 View  04/12/2013   *RADIOLOGY REPORT*  Clinical Data: Congestive heart failure.  PORTABLE CHEST - 1 VIEW  Comparison: Chest x-ray 04/11/2013.  Findings: There is cephalization of the pulmonary vasculature and slight indistinctness of the interstitial markings suggestive of mild  pulmonary edema.  Lung volumes are slightly low.  No definite pleural effusions.  Moderate to severe cardiomegaly redemonstrated. The patient is rotated to the left on today's exam, resulting in distortion of the mediastinal contours and reduced diagnostic sensitivity and specificity for mediastinal pathology.  Left-sided pacemaker / AICD in place with lead tips projecting over the expected location of the right atrium and right ventricular apex.  IMPRESSION: 1.  Findings again compatible with congestive heart failure, however, the extent of pulmonary edema has decreased compared to the prior study.   Original Report Authenticated By: Trudie Reed, M.D.    Scheduled Meds: . amiodarone  200 mg Oral Daily  . aspirin EC  81 mg Oral Daily  . digoxin  0.125 mg Oral Daily  . docusate sodium  100 mg Oral BID  . feeding supplement  237 mL Oral BID BM  . furosemide  40 mg Oral Daily  . insulin aspart  0-20 Units Subcutaneous TID WC  . insulin aspart  0-5 Units Subcutaneous QHS  . levETIRAcetam  1,000 mg Oral BID  . levothyroxine  50 mcg Oral QAC breakfast  . lisinopril  10 mg Oral Daily  . metoprolol succinate  25 mg Oral BID  . pantoprazole  40 mg Oral Daily  . polyethylene glycol  17 g Oral BID  . potassium chloride SA  20 mEq Oral BID  . Rivaroxaban  20 mg Oral Q supper  . sodium chloride  3 mL Intravenous Q12H  . spironolactone  25 mg Oral Daily   Continuous Infusions:   Principal Problem:   Chronic systolic heart failure Active Problems:   Atrial fibrillation   Narcotic abuse   Protein C deficiency   Chronic pain   DM type 2 (diabetes mellitus, type 2)    Time spent:    CHIU, STEPHEN K  [facility] Pager [phone]. If 7PM-7AM, please contact night-coverage at [email or web address], password [facility] 04/13/2013, 9:10 AM  LOS: 2 days

## 2013-04-13 NOTE — Progress Notes (Signed)
In room to give patients scheduled medications, pt started crying, stating "no," refusing to take PO medications, stating she wants to "sleep." Physician aware.

## 2013-04-13 NOTE — Progress Notes (Signed)
Pt c/o H/A intermittently.  Had refused tylenol.  Dr. Red Christians informed and instructed to offer her some percocet and no iv med at this time.  Percocet 2 tabs offered to pt and she  Refused to take them.  Will continue to monitor.  Amanda Pea, [name].

## 2013-04-14 ENCOUNTER — Inpatient Hospital Stay (HOSPITAL_COMMUNITY): Payer: Medicare Other

## 2013-04-14 LAB — BASIC METABOLIC PANEL
CO2: 24 mEq/L (ref 19–32)
Chloride: 102 mEq/L (ref 96–112)
Sodium: 136 mEq/L (ref 135–145)

## 2013-04-14 LAB — GLUCOSE, CAPILLARY
Glucose-Capillary: 106 mg/dL — ABNORMAL HIGH (ref 70–99)
Glucose-Capillary: 161 mg/dL — ABNORMAL HIGH (ref 70–99)

## 2013-04-14 LAB — PRO B NATRIURETIC PEPTIDE: Pro B Natriuretic peptide (BNP): 5776 pg/mL — ABNORMAL HIGH (ref 0–125)

## 2013-04-14 MED ORDER — ADULT MULTIVITAMIN W/MINERALS CH
1.0000 | ORAL_TABLET | Freq: Every day | ORAL | Status: DC
  Filled 2013-04-14: qty 1

## 2013-04-14 NOTE — Progress Notes (Signed)
Pt keep requesting pain pill and decline any time is offered to her.  Using hand to motion no rather have iv pain med.  Also refusing her po meds.  Will continue to encourage her.  Spoke with Dr. Rhona Leavens yesterday and instructed to use po pain med.  Pt made aware of instructions received from MD also.  Will continue to monitor.  Amanda Pea, [name].

## 2013-04-14 NOTE — Progress Notes (Signed)
Pt made aware that she has d/c orders to go home.  Requested to call son., Sharde Gover.   Informed son and he instructed that he has someone on the way to pick pt up.  Also son spoke with pt.  Amanda Pea, [name].

## 2013-04-14 NOTE — Progress Notes (Signed)
INITIAL NUTRITION ASSESSMENT  DOCUMENTATION CODES Per approved criteria  -Not Applicable   INTERVENTION: Continue Ensure Complete po BID, each supplement provides 350 kcal and 13 grams of protein. Add MVI daily. RD to continue to follow nutrition care plan.  NUTRITION DIAGNOSIS: Inadequate oral intake related to pain as evidenced by poor meal completion.   Goal: Intake to meet >90% of estimated nutrition needs.  Monitor:  weight trends, lab trends, I/O's, PO intake, supplement tolerance  Reason for Assessment: Malnutrition Screening Tool  57 y.o. female  Admitting Dx: Chronic systolic heart failure  ASSESSMENT: Hx of CVA and EF (most recent EF is 10%.) Pt initially presented to OSH - workup revealed vascular congestion vs PNA.  Pt has being complaining of pain since admission and refusing po pain medications. PT recommend Pysch consult.  Currently ordered for Heart Healthy diet with 1500 ml fluid restriction. Consuming <50% of meals. Pt unable to talk to this RD, repeating over and over that she has a HA and is in severe pain. Began crying.   Pt with 14% wt loss x 7 months. This is significant for the time frame. Unable to dx with malnutrition at this time as unable to obtain nutrition hx and complete physical assessment.  Height: Ht Readings from Last 1 Encounters:  04/13/13 5' (1.524 m)    Weight: Wt Readings from Last 1 Encounters:  04/14/13 115 lb 4.8 oz (52.3 kg)    Ideal Body Weight: 125 lb  % Ideal Body Weight: 92%  Wt Readings from Last 20 Encounters:  04/14/13 115 lb 4.8 oz (52.3 kg)  04/01/13 118 lb (53.524 kg)  02/26/13 108 lb 12.8 oz (49.351 kg)  01/31/13 110 lb (49.896 kg)  01/17/13 116 lb 6.4 oz (52.799 kg)  01/10/13 118 lb 13.3 oz (53.9 kg)  11/30/12 115 lb (52.164 kg)  11/08/12 115 lb (52.164 kg)  11/05/12 116 lb 12.8 oz (52.98 kg)  09/26/12 119 lb (53.978 kg)  09/19/12 125 lb 3.2 oz (56.79 kg)  08/28/12 133 lb 9.6 oz (60.6 kg)  08/01/12  135 lb (61.236 kg)  07/25/12 135 lb (61.236 kg)  07/22/12 135 lb 2.3 oz (61.3 kg)  07/22/12 135 lb 2.3 oz (61.3 kg)  04/24/12 157 lb 11.2 oz (71.532 kg)   Usual Body Weight:  133 - 135 lb  % Usual Body Weight: 86%  BMI:  Body mass index is 22.52 kg/(m^2). WNL  Estimated Nutritional Needs: Kcal: 1400 - 1600 Protein: 50 - 60 g Fluid: 1.5 liters daily  Skin: intact  Diet Order: Cardiac  EDUCATION NEEDS: -No education needs identified at this time   Intake/Output Summary (Last 24 hours) at 04/14/13 1129 Last data filed at 04/14/13 1017  Gross per 24 hour  Intake    360 ml  Output   1400 ml  Net  -1040 ml    Last BM: 7/27  Labs:   Recent Labs Lab 04/12/13 0500 04/13/13 0410 04/14/13 0430  NA 138 134* 136  K 3.9 4.3 4.4  CL 102 99 102  CO2 24 27 24   BUN 21 34* 28*  CREATININE 0.97 1.09 0.97  CALCIUM 9.1 9.5 8.6  GLUCOSE 255* 212* 107*    CBG (last 3)   Recent Labs  04/13/13 1743 04/13/13 2254 04/14/13 0545  GLUCAP 237* 89 106*    Scheduled Meds: . amiodarone  200 mg Oral Daily  . aspirin EC  81 mg Oral Daily  . digoxin  0.125 mg Oral Daily  . docusate  sodium  100 mg Oral BID  . feeding supplement  237 mL Oral BID BM  . furosemide  40 mg Oral BID  . insulin aspart  0-20 Units Subcutaneous TID WC  . insulin aspart  0-5 Units Subcutaneous QHS  . levETIRAcetam  1,000 mg Oral BID  . levothyroxine  50 mcg Oral QAC breakfast  . lisinopril  10 mg Oral Daily  . metolazone  2.5 mg Oral Daily  . metoprolol succinate  25 mg Oral BID  . metoprolol succinate  25 mg Oral Daily  . pantoprazole  40 mg Oral Daily  . polyethylene glycol  17 g Oral BID  . potassium chloride SA  20 mEq Oral BID  . Rivaroxaban  20 mg Oral Q supper  . sodium chloride  3 mL Intravenous Q12H  . spironolactone  25 mg Oral Daily    Continuous Infusions:   Past Medical History  Diagnosis Date  . Seizures   . Hypertension   . Stroke     2007 - R sided weakness and expressive  aphasia with h/o behavioral problems related to this  . Asthma   . Diabetes mellitus   . 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 Chronic systolic CHF (congestive heart failure)     a. s/p AICD ~2007 ([employer]) - previous care [facility]. No known hx CAD. b. EF 10% by echo 04/2012.  Marland Kitchen Atrial fibrillation     04/2012 - not felt to be an anticoag candidate long term at that time, but later had DVT so was on placed on Xarelto  . Coagulopathy     "Protein C deficiency"  . History of DVT (deep vein thrombosis)     a. 06/2012: RLE DVT, + protein C deficiency, placed on Xarelto. s/p IVC filter  . Noncompliance     Due to mental status, family has had to coax her to take medicines  . Hypothyroidism   . VT (ventricular tachycardia)     a. h/o ICD ~2007-2008. b. Adm 08/2012 with UTI, had VT/ICD shock x 5 (hypokalemic)  . Protein C deficiency     Past Surgical History  Procedure Laterality Date  . Cholecystectomy    . Appendectomy    . Pacemaker insertion    . Vena cava filter placement  07/19/2012    Procedure: INSERTION VENA-CAVA FILTER;  Surgeon: Larina Earthly, MD;  Location: [facility] OR;  Service: Vascular;  Laterality: [name];  . Insert / replace / remove pacemaker      ICD  . Abdominal hysterectomy      Jarold Motto MS, RD, LDN Pager: [phone] After-hours pager: [phone]

## 2013-04-14 NOTE — Progress Notes (Signed)
All d/c instruction explained and given to pt and her brother Nelson Julson.  Verbalized understanding.  D/c off floor via w/c. To home.  Escorted by her brother.  Amanda Pea, [name].

## 2013-04-14 NOTE — Progress Notes (Signed)
CT scan reviewed. No acute findings. Only old stroke present. Per previous exam findings, no acute neurologic changes. Findings discussed with Feliz Beam, pt's son, who is aware and agrees with plan. Plan for d/c today with close outpatient follow up.

## 2013-04-14 NOTE — Progress Notes (Signed)
OT Cancellation Note  Patient Details Name: Elizabeth Love MRN: [id number] DOB: June 03, 1956   Cancelled Treatment:    Reason Eval/Treat Not Completed: Other (comment) (pt declined. stated tired and pulled covers over head.)  Lennox Laity [phone] 04/14/2013, 12:28 PM

## 2013-04-14 NOTE — Discharge Summary (Signed)
Physician Discharge Summary  Elizabeth Love ZOX:[id number] DOB: 1956/06/29 DOA: 04/11/2013  PCP: Elizabeth Love, Elizabeth Dach, MD  Admit date: 04/11/2013 Discharge date: 04/14/2013  Time spent: 30 minutes  Recommendations for Outpatient Follow-up:  1. 2 gram sodium restriction 2. Please re-evaluate patients need for chronic narcotic pain meds (see below) 3. Follow up with Cardiology in 1 week 4. Follow up with PCP in 1-2 weeks  Discharge Diagnoses:  Principal Problem:   Chronic systolic heart failure Active Problems:   Atrial fibrillation   Narcotic abuse   Protein C deficiency   Chronic pain   DM type 2 (diabetes mellitus, type 2)   Discharge Condition: Improved  Diet recommendation: 2gm restricted sodium diet  Filed Weights   04/13/13 0040 04/13/13 1323 04/14/13 0537  Weight: 52 kg (114 lb 10.2 oz) 50.7 kg (111 lb 12.4 oz) 52.3 kg (115 lb 4.8 oz)    History of present illness:  Elizabeth Love is a 57 y.o. female With a hx of systolic chf with the last EF noted to be around 10% in 4/14. The patient is currently lethargic, so an accurate history cannot be obtained from the patient. Per report, the patient initially presented to an OSH where a cxr demonstrated vascular congestion vs/ pna. The patient was empirically started on abx and IV lasix was given. The patient was subsequently transferred to [facility] for further care.  Hospital Course:  Acute systolic chf exacerbation with respiratory failure:  - The patient was initially started on schedule IV lasix - Repeat 2D echo with EF of 10%  - Continued  on ACEI  - The patient was initially on a venti mask on initial presentation and was subsequently weaned to RA with o2 sats of 100% by the day of discharge  - BNP was noted to be initially over 10,000 and was noted to be improved to 5000 on the day of discharge. ?COPD:  - Early on, the patient was noted to have wheezing - She was continued with prn nebs and steroids -  No further events through the remainder of the hospital course Hx Afib:  - Cont Bblocker and dig per home meds  - Cont tele  - s/p pacemaker placement  DM:  - The patient was continued on SSI  Hx MI:  - The patient was contiued xarelto and ASA  DVT prophylaxis:  - On xarelto  HX of CVA with Protein C deficiency  - On xarelto as above Chronic Pain: - The patient displayed pain out of proportion to her reported pain (mainly headache) - A repeat CT head w/o contrast was found to be unremarkable for acute process - The patient was noted to refuse home oral pain meds and specifically request IV dilaudid - This was discussed with the patient's son, Feliz Beam, who states that the patient has been "sneaking around" the home for pain meds, requiring family members to hide pain pills. - Will have this addressed with PCP  Discharge Exam: Filed Vitals:   04/13/13 1323 04/13/13 2202 04/14/13 0210 04/14/13 0537  BP: 105/60 129/80 108/73 114/74  Pulse: 61 65 58 60  Temp: 98.1 F (36.7 C) 98.4 F (36.9 C) 97.9 F (36.6 C) 98.3 F (36.8 C)  TempSrc: Oral Oral Oral Oral  Resp: 18 19 18 18   Height: 5' (1.524 m)     Weight: 50.7 kg (111 lb 12.4 oz)   52.3 kg (115 lb 4.8 oz)  SpO2: 98% 100% 100% 98%  General: Awake, in nad Cardiovascular: regular, s1, s2 Respiratory: normal respiratory effort, no wheezing  Discharge Instructions   Future Appointments Provider Department Dept Phone   04/30/2013 10:40 AM [facility] [phone]       Medication List    ASK your doctor about these medications       acetaminophen 325 MG tablet  Commonly known as:  TYLENOL  Take 650 mg by mouth every 6 (six) hours as needed for pain.     albuterol (2.5 MG/3ML) 0.083% nebulizer solution  Commonly known as:  PROVENTIL  Take 2.5 mg by nebulization every 6 (six) hours as needed. For cough or wheeze     ALPRAZolam 0.5 MG tablet  Commonly known as:   XANAX  Take 0.5 mg by mouth daily as needed for sleep or anxiety. For sleep or anxiety     amiodarone 400 MG tablet  Commonly known as:  PACERONE  Take 0.5 tablets (200 mg total) by mouth daily.     digoxin 0.125 MG tablet  Commonly known as:  LANOXIN  Take 1 tablet (0.125 mg total) by mouth daily.     furosemide 40 MG tablet  Commonly known as:  LASIX  Take 40 mg by mouth 2 (two) times daily.     levETIRAcetam 500 MG tablet  Commonly known as:  KEPPRA  Take 1,000 mg by mouth 2 (two) times daily.     levothyroxine 50 MCG tablet  Commonly known as:  SYNTHROID, LEVOTHROID  Take 50 mcg by mouth daily.     lisinopril 10 MG tablet  Commonly known as:  PRINIVIL,ZESTRIL  Take 1 tablet (10 mg total) by mouth daily.     metolazone 2.5 MG tablet  Commonly known as:  ZAROXOLYN  Take 2.5 mg by mouth daily as needed. For fluid retention     metoprolol succinate 25 MG 24 hr tablet  Commonly known as:  TOPROL-XL  Take 25 mg by mouth daily.     omeprazole 20 MG capsule  Commonly known as:  PRILOSEC  Take 20 mg by mouth 2 (two) times daily.     oxyCODONE-acetaminophen 10-325 MG per tablet  Commonly known as:  PERCOCET  Take 1 tablet by mouth every 6 (six) hours as needed for pain. Scheduled per pharmacy     polyethylene glycol packet  Commonly known as:  MIRALAX / GLYCOLAX  Take 17 g by mouth daily as needed. For constipation     potassium chloride SA 20 MEQ tablet  Commonly known as:  K-DUR,KLOR-CON  Take 20 mEq by mouth 2 (two) times daily.     Rivaroxaban 20 MG Tabs  Commonly known as:  XARELTO  Take 1 tablet (20 mg total) by mouth daily with supper.     spironolactone 25 MG tablet  Commonly known as:  ALDACTONE  Take 1 tablet (25 mg total) by mouth daily.       Allergies  Allergen Reactions  . Penicillins Rash  . Sulfa Antibiotics Rash      The results of significant diagnostics from this hospitalization (including imaging, microbiology, ancillary and  laboratory) are listed below for reference.    Significant Diagnostic Studies: Dg Chest Port 1 View  04/14/2013   *RADIOLOGY REPORT*  Clinical Data: Congestive heart failure  PORTABLE CHEST - 1 VIEW  Comparison: 04/12/2013  Findings: Left AICD in place.  Marked cardiomegaly/enlargement cardiomediastinal silhouette persists.  Patchy perihilar airspace opacities are again noted with interstitial  Kerley B lines and trace effusions.  No significant change since prior exam.  IMPRESSION: Cardiomegaly with interstitial pulmonary edema re-identified.  No significant change.   Original Report Authenticated By: Christiana Pellant, M.D.   Dg Chest Port 1 View  04/12/2013   *RADIOLOGY REPORT*  Clinical Data: Congestive heart failure.  PORTABLE CHEST - 1 VIEW  Comparison: Chest x-ray 04/11/2013.  Findings: There is cephalization of the pulmonary vasculature and slight indistinctness of the interstitial markings suggestive of mild pulmonary edema.  Lung volumes are slightly low.  No definite pleural effusions.  Moderate to severe cardiomegaly redemonstrated. The patient is rotated to the left on today's exam, resulting in distortion of the mediastinal contours and reduced diagnostic sensitivity and specificity for mediastinal pathology.  Left-sided pacemaker / AICD in place with lead tips projecting over the expected location of the right atrium and right ventricular apex.  IMPRESSION: 1.  Findings again compatible with congestive heart failure, however, the extent of pulmonary edema has decreased compared to the prior study.   Original Report Authenticated By: Trudie Reed, M.D.   Dg Abd Acute W/chest  04/11/2013   *RADIOLOGY REPORT*  Clinical Data: Chest pain and wheezing.  Abdominal pain and headache.  Shortness of breath.  ACUTE ABDOMEN SERIES (ABDOMEN 2 VIEW & CHEST 1 VIEW)  Comparison: Chest and abdomen 01/06/2013  Findings: Cardiac enlargement with pulmonary vascular congestion and perihilar and interstitial  edema.  Findings have progressed since previous study.  Superimposed pneumonia is not excluded.  No blunting of costophrenic angles.  No pneumothorax.  Stable appearance of cardiac pacemaker.  Scattered gas and stool in the colon.  No small or large bowel distension.  No free intra-abdominal air.  No radiopaque stones. Calcified phleboliths in the pelvis.  Surgical clips in the right upper quadrant.  Inferior vena caval type filter.  Visualized bones appear intact.  No significant change since previous study.  IMPRESSION: Cardiac enlargement with pulmonary vascular congestion and perihilar interstitial edema, demonstrating progression since previous study.  Superimposed pneumonia is not excluded. Nonobstructive bowel gas pattern.   Original Report Authenticated By: Burman Nieves, M.D.    Microbiology: Recent Results (from the past 240 hour(s))  MRSA PCR SCREENING     Status: None   Collection Time    04/11/13  6:59 AM      Result Value Range Status   MRSA by PCR NEGATIVE  NEGATIVE Final   Comment:            The GeneXpert MRSA Assay (FDA     approved for NASAL specimens     only), is one component of a     comprehensive MRSA colonization     surveillance program. It is not     intended to diagnose MRSA     infection nor to guide or     monitor treatment for     MRSA infections.     Labs: Basic Metabolic Panel:  Recent Labs Lab 04/11/13 0220 04/11/13 1050 04/12/13 0500 04/13/13 0410 04/14/13 0430  NA 143  --  138 134* 136  K 3.6  --  3.9 4.3 4.4  CL 104  --  102 99 102  CO2 20  --  24 27 24   GLUCOSE 212*  --  255* 212* 107*  BUN 13  --  21 34* 28*  CREATININE 0.80 0.82 0.97 1.09 0.97  CALCIUM 9.8  --  9.1 9.5 8.6   Liver Function Tests:  Recent Labs Lab 04/11/13 0220  AST  30  ALT 16  ALKPHOS 132*  BILITOT 0.9  PROT 8.7*  ALBUMIN 3.6    Recent Labs Lab 04/11/13 0220  LIPASE 15   No results found for this basename: AMMONIA,  in the last 168  hours CBC:  Recent Labs Lab 04/11/13 0220 04/11/13 1050  WBC 13.7* 13.3*  NEUTROABS 7.9*  --   HGB 10.2* 8.8*  HCT 34.8* 29.3*  MCV 87.7 84.4  PLT 254 218   Cardiac Enzymes:  Recent Labs Lab 04/11/13 0220  TROPONINI <0.30   BNP: BNP (last 3 results)  Recent Labs  04/11/13 0220 04/12/13 0500 04/14/13 0430  PROBNP 10700.0* 21955.0* 5776.0*   CBG:  Recent Labs Lab 04/13/13 1329 04/13/13 1743 04/13/13 2254 04/14/13 0545 04/14/13 1142  GLUCAP 258* 237* 89 106* 161*       Signed:  CHIU, STEPHEN K  [facility] 04/14/2013, 12:05 PM

## 2013-04-14 NOTE — Progress Notes (Signed)
Text page sent to Dr. Rhona Leavens pt c/o pain and has been offered pain meds, and insisting on iv med when offered.  Also VT, HR 177 and noted by monitors that rate is incorrect.  Will continue to monitor.  Amanda Pea, [name].

## 2013-04-14 NOTE — Progress Notes (Signed)
Pt's foley d/c.  Pt tolerated well.  Amanda Pea, [name].

## 2013-04-30 ENCOUNTER — Ambulatory Visit (HOSPITAL_COMMUNITY): Admission: RE | Admit: 2013-04-30 | Payer: Medicare Other | Source: Ambulatory Visit

## 2013-04-30 ENCOUNTER — Other Ambulatory Visit (HOSPITAL_COMMUNITY): Payer: Self-pay | Admitting: Internal Medicine

## 2013-05-02 ENCOUNTER — Encounter (HOSPITAL_COMMUNITY): Payer: Self-pay | Admitting: *Deleted

## 2013-05-15 ENCOUNTER — Emergency Department (HOSPITAL_BASED_OUTPATIENT_CLINIC_OR_DEPARTMENT_OTHER): Payer: Medicare Other

## 2013-05-15 ENCOUNTER — Encounter (HOSPITAL_BASED_OUTPATIENT_CLINIC_OR_DEPARTMENT_OTHER): Payer: Self-pay | Admitting: *Deleted

## 2013-05-15 ENCOUNTER — Emergency Department (HOSPITAL_BASED_OUTPATIENT_CLINIC_OR_DEPARTMENT_OTHER)
Admission: EM | Admit: 2013-05-15 | Discharge: 2013-05-15 | Disposition: A | Discharge status: Home / Self Care | Payer: Medicare Other | Attending: Emergency Medicine | Admitting: Emergency Medicine

## 2013-05-15 DIAGNOSIS — Z880 Allergy status to penicillin: Secondary | ICD-10-CM | POA: Insufficient documentation

## 2013-05-15 DIAGNOSIS — J45909 Unspecified asthma, uncomplicated: Secondary | ICD-10-CM | POA: Insufficient documentation

## 2013-05-15 DIAGNOSIS — Z95810 Presence of automatic (implantable) cardiac defibrillator: Secondary | ICD-10-CM | POA: Insufficient documentation

## 2013-05-15 DIAGNOSIS — R51 Headache: Secondary | ICD-10-CM | POA: Insufficient documentation

## 2013-05-15 DIAGNOSIS — E119 Type 2 diabetes mellitus without complications: Secondary | ICD-10-CM | POA: Insufficient documentation

## 2013-05-15 DIAGNOSIS — Z86718 Personal history of other venous thrombosis and embolism: Secondary | ICD-10-CM | POA: Insufficient documentation

## 2013-05-15 DIAGNOSIS — E039 Hypothyroidism, unspecified: Secondary | ICD-10-CM | POA: Insufficient documentation

## 2013-05-15 DIAGNOSIS — Z91199 Patient's noncompliance with other medical treatment and regimen due to unspecified reason: Secondary | ICD-10-CM | POA: Insufficient documentation

## 2013-05-15 DIAGNOSIS — Z79899 Other long term (current) drug therapy: Secondary | ICD-10-CM | POA: Insufficient documentation

## 2013-05-15 DIAGNOSIS — Z862 Personal history of diseases of the blood and blood-forming organs and certain disorders involving the immune mechanism: Secondary | ICD-10-CM | POA: Insufficient documentation

## 2013-05-15 DIAGNOSIS — Z87448 Personal history of other diseases of urinary system: Secondary | ICD-10-CM | POA: Insufficient documentation

## 2013-05-15 DIAGNOSIS — I10 Essential (primary) hypertension: Secondary | ICD-10-CM | POA: Insufficient documentation

## 2013-05-15 DIAGNOSIS — G40909 Epilepsy, unspecified, not intractable, without status epilepticus: Secondary | ICD-10-CM | POA: Insufficient documentation

## 2013-05-15 DIAGNOSIS — Z8673 Personal history of transient ischemic attack (TIA), and cerebral infarction without residual deficits: Secondary | ICD-10-CM | POA: Insufficient documentation

## 2013-05-15 DIAGNOSIS — Z87891 Personal history of nicotine dependence: Secondary | ICD-10-CM | POA: Insufficient documentation

## 2013-05-15 DIAGNOSIS — Z9119 Patient's noncompliance with other medical treatment and regimen: Secondary | ICD-10-CM | POA: Insufficient documentation

## 2013-05-15 DIAGNOSIS — Z8679 Personal history of other diseases of the circulatory system: Secondary | ICD-10-CM | POA: Insufficient documentation

## 2013-05-15 DIAGNOSIS — I509 Heart failure, unspecified: Secondary | ICD-10-CM | POA: Insufficient documentation

## 2013-05-15 DIAGNOSIS — Y838 Other surgical procedures as the cause of abnormal reaction of the patient, or of later complication, without mention of misadventure at the time of the procedure: Secondary | ICD-10-CM | POA: Insufficient documentation

## 2013-05-15 DIAGNOSIS — I5022 Chronic systolic (congestive) heart failure: Secondary | ICD-10-CM | POA: Insufficient documentation

## 2013-05-15 LAB — BASIC METABOLIC PANEL
CO2: 25 mEq/L (ref 19–32)
Calcium: 10 mg/dL (ref 8.4–10.5)
Creatinine, Ser: 0.9 mg/dL (ref 0.50–1.10)

## 2013-05-15 LAB — URINALYSIS, ROUTINE W REFLEX MICROSCOPIC
Bilirubin Urine: NEGATIVE
Hgb urine dipstick: NEGATIVE
Ketones, ur: NEGATIVE mg/dL
Nitrite: NEGATIVE
pH: 7.5 (ref 5.0–8.0)

## 2013-05-15 LAB — CBC WITH DIFFERENTIAL/PLATELET
Basophils Absolute: 0 10*3/uL (ref 0.0–0.1)
Basophils Relative: 0 % (ref 0–1)
Eosinophils Relative: 1 % (ref 0–5)
HCT: 33.6 % — ABNORMAL LOW (ref 36.0–46.0)
Lymphocytes Relative: 22 % (ref 12–46)
MCHC: 30.4 g/dL (ref 30.0–36.0)
MCV: 83 fL (ref 78.0–100.0)
Monocytes Absolute: 0.6 10*3/uL (ref 0.1–1.0)
RDW: 18.3 % — ABNORMAL HIGH (ref 11.5–15.5)

## 2013-05-15 LAB — DIGOXIN LEVEL: Digoxin Level: <0.3 ng/mL — ABNORMAL LOW (ref 0.8–2.0)

## 2013-05-15 LAB — URINE MICROSCOPIC-ADD ON

## 2013-05-15 MED ORDER — HYDROMORPHONE HCL PF 1 MG/ML IJ SOLN
1.0000 mg | Freq: Once | INTRAMUSCULAR | Status: AC
  Administered 2013-05-15: 1 mg via INTRAMUSCULAR
  Filled 2013-05-15: qty 1

## 2013-05-15 NOTE — ED Notes (Signed)
Error in charting, urine not yet collected, reordered.

## 2013-05-15 NOTE — ED Provider Notes (Signed)
Medical screening examination/treatment/procedure(s) were performed by non-physician practitioner and as supervising physician I was immediately available for consultation/collaboration.   Megan E Docherty, MD 05/15/13 2106 

## 2013-05-15 NOTE — ED Notes (Signed)
Pt to room 3 by ems via stretcher. Per ems pt is well known to them, son called 911 for pt c/o ha since having a panic attack this morning. Per ems pt has history of panic attacks and cva with residual right sided weakness and aphasia. When asked if she has any pain, pt points to her head with her left hand and states "just my head...".

## 2013-05-15 NOTE — ED Notes (Signed)
Patient transported to CT 

## 2013-05-15 NOTE — ED Notes (Signed)
Pt states "my defibrillator just fired..." pt is pointing to her left chest. Teressa Lower, NP alerted, ekg in progress.

## 2013-05-15 NOTE — ED Notes (Signed)
Via [facility]--call back on [phone]

## 2013-05-15 NOTE — ED Notes (Signed)
Pt placed on cardiac monitor. Pt states "I hate that show..." pointing to television, channel changed to pt preference.

## 2013-05-15 NOTE — ED Notes (Signed)
This rn calls son, Feliz Beam. Feliz Beam states he wanted pt seen at ed due to her complaints of having a headache after having a panic attack this am. Feliz Beam states that none of pt medications have changed since she was seen here last.

## 2013-05-15 NOTE — ED Provider Notes (Signed)
CSN: [id number]     Arrival date & time 05/15/13  1216 History   First MD Initiated Contact with Patient 05/15/13 1219     Chief Complaint  Patient presents with  . Headache   (Consider location/radiation/quality/duration/timing/severity/associated sxs/prior Treatment) HPI Comments: History limited as pt has expressive aphasia from previous stroke:pt states that she is hurting in her left head:unable to say when it started:denies taking anything at home  The history is provided by the patient and the EMS personnel. No language interpreter was used.    Past Medical History  Diagnosis Date  . Seizures   . Hypertension   . Stroke     2007 - R sided weakness and expressive aphasia with h/o behavioral problems related to this  . Asthma   . Diabetes mellitus   . 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 Chronic systolic CHF (congestive heart failure)     a. s/p AICD ~2007 ([employer]) - previous care [place]. No known hx CAD. b. EF 10% by echo 04/2012.  Marland Kitchen Atrial fibrillation     04/2012 - not felt to be an anticoag candidate long term at that time, but later had DVT so was on placed on Xarelto  . Coagulopathy     "Protein C deficiency"  . History of DVT (deep vein thrombosis)     a. 06/2012: RLE DVT, + protein C deficiency, placed on Xarelto. s/p IVC filter  . Noncompliance     Due to mental status, family has had to coax her to take medicines  . Hypothyroidism   . VT (ventricular tachycardia)     a. h/o ICD ~2007-2008. b. Adm 08/2012 with UTI, had VT/ICD shock x 5 (hypokalemic)  . Protein C deficiency    Past Surgical History  Procedure Laterality Date  . Cholecystectomy    . Appendectomy    . Pacemaker insertion    . Vena cava filter placement  07/19/2012    Procedure: INSERTION VENA-CAVA FILTER;  Surgeon: Larina Earthly, MD;  Location: [facility] OR;  Service: Vascular;  Laterality: [name];  . Insert / replace / remove pacemaker      ICD  . Abdominal hysterectomy     Family History   Problem Relation Age of Onset  . Coronary artery disease Neg Hx   . Hypertension Mother    History  Substance Use Topics  . Smoking status: Former Games developer  . Smokeless tobacco: Not on file  . Alcohol Use: No   OB History   Grav Para Term Preterm Abortions TAB SAB Ect Mult Living                 Review of Systems  Unable to perform ROS: Other    Allergies  Penicillins and Sulfa antibiotics  Home Medications   Current Outpatient Rx  Name  Route  Sig  Dispense  Refill  . acetaminophen (TYLENOL) 325 MG tablet   Oral   Take 650 mg by mouth every 6 (six) hours as needed for pain.         Marland Kitchen albuterol (PROVENTIL) (2.5 MG/3ML) 0.083% nebulizer solution   Nebulization   Take 2.5 mg by nebulization every 6 (six) hours as needed. For cough or wheeze         . ALPRAZolam (XANAX) 0.5 MG tablet   Oral   Take 0.5 mg by mouth daily as needed for sleep or anxiety. For sleep or anxiety         .  amiodarone (PACERONE) 400 MG tablet   Oral   Take 0.5 tablets (200 mg total) by mouth daily.   15 tablet   3   . digoxin (LANOXIN) 0.125 MG tablet   Oral   Take 1 tablet (0.125 mg total) by mouth daily.   30 tablet   0   . furosemide (LASIX) 40 MG tablet   Oral   Take 40 mg by mouth 2 (two) times daily.         . furosemide (LASIX) 40 MG tablet      TAKE 1 TABLET BY MOUTH TWICE DAILY   60 tablet   5   . levETIRAcetam (KEPPRA) 500 MG tablet   Oral   Take 1,000 mg by mouth 2 (two) times daily.         Marland Kitchen levothyroxine (SYNTHROID, LEVOTHROID) 50 MCG tablet   Oral   Take 50 mcg by mouth daily.         Marland Kitchen lisinopril (PRINIVIL,ZESTRIL) 10 MG tablet   Oral   Take 1 tablet (10 mg total) by mouth daily.   30 tablet   3   . metolazone (ZAROXOLYN) 2.5 MG tablet   Oral   Take 2.5 mg by mouth daily as needed. For fluid retention         . metoprolol succinate (TOPROL-XL) 25 MG 24 hr tablet   Oral   Take 25 mg by mouth daily.         Marland Kitchen omeprazole (PRILOSEC) 20  MG capsule   Oral   Take 20 mg by mouth 2 (two) times daily.         Marland Kitchen oxyCODONE-acetaminophen (PERCOCET) 10-325 MG per tablet   Oral   Take 1 tablet by mouth every 6 (six) hours as needed for pain. Scheduled per pharmacy   30 tablet   0   . polyethylene glycol (MIRALAX / GLYCOLAX) packet   Oral   Take 17 g by mouth daily as needed. For constipation         . potassium chloride SA (K-DUR,KLOR-CON) 20 MEQ tablet   Oral   Take 20 mEq by mouth 2 (two) times daily.         . Rivaroxaban (XARELTO) 20 MG TABS   Oral   Take 1 tablet (20 mg total) by mouth daily with supper.   30 tablet   6   . spironolactone (ALDACTONE) 25 MG tablet   Oral   Take 1 tablet (25 mg total) by mouth daily.   30 tablet   3    BP 109/68  Pulse 79  Temp(Src) 98.3 F (36.8 C) (Oral)  Resp 20  SpO2 98% Physical Exam  Nursing note and vitals reviewed. Constitutional: She appears well-developed.  HENT:  Head: Normocephalic and atraumatic.  Neck: Normal range of motion. Neck supple.  Cardiovascular: Normal rate and regular rhythm.   Pulmonary/Chest: Effort normal and breath sounds normal.  Abdominal: Soft. Bowel sounds are normal. There is no tenderness.  Musculoskeletal: She exhibits no tenderness.  Neurological: She is alert.  Expressive aphasia and right sided weakness from previous cva  Skin: Skin is warm and dry.  Psychiatric: She has a normal mood and affect.    ED Course  Procedures (including critical care time) Labs Review Labs Reviewed  CBC WITH DIFFERENTIAL - Abnormal; Notable for the following:    Hemoglobin 10.2 (*)    HCT 33.6 (*)    MCH 25.2 (*)    RDW 18.3 (*)  All other components within normal limits  BASIC METABOLIC PANEL - Abnormal; Notable for the following:    Glucose, Bld 128 (*)    GFR calc non Af Amer 71 (*)    GFR calc Af Amer 83 (*)    All other components within normal limits  DIGOXIN LEVEL - Abnormal; Notable for the following:    Digoxin Level  <0.3 (*)    All other components within normal limits  URINALYSIS, ROUTINE W REFLEX MICROSCOPIC - Abnormal; Notable for the following:    Protein, ur 100 (*)    Leukocytes, UA TRACE (*)    All other components within normal limits  URINE MICROSCOPIC-ADD ON   Imaging Review Dg Chest 2 View  05/15/2013   *RADIOLOGY REPORT*  Clinical Data: Defibrillator activation.  CHEST - 2 VIEW  Comparison: 04/14/2013.  Findings: The pacer wires / AICD are stable.  The heart is markedly enlarged but stable.  There is chronic vascular congestion and chronic bronchitic lung changes.  No focal infiltrates or pneumothorax.  IMPRESSION: Stable position of the pacer wires / AICD. Stable cardiac enlargement. Chronic lung changes without definite acute overlying pulmonary process.   Original Report Authenticated By: Rudie Meyer, M.D.   Ct Head Wo Contrast  05/15/2013   *RADIOLOGY REPORT*  Clinical Data: Headache.  CT HEAD WITHOUT CONTRAST  Technique:  Contiguous axial images were obtained from the base of the skull through the vertex without contrast.  Comparison: 04/14/2013.  Findings: Large remote left MCA territory infarct with encephalomalacia and ex vacuo dilatation of the left lateral ventricle.  No new/acute intracranial findings.  No extra-axial fluid collections.  No intracranial hemorrhage or mass lesion.  The brainstem and cerebellum appear stable.  Wallerian degeneration of the left pons is noted.  The bony structures are intact.  No destructive bone lesions or acute fracture.  The paranasal sinuses and mastoid air cells are clear except for scattered ethmoid disease.  The globes are intact.  IMPRESSION:  1.  Remote large left MCA territory infarct with encephalomalacia and ex vacuo dilatation of the left lateral ventricle. 2.  No new/acute intracranial findings.   Original Report Authenticated By: Rudie Meyer, M.D.   Date: 05/15/2013  Rate: 96  Rhythm: normal sinus rhythm and premature atrial contractions  (PAC)  QRS Axis: left  Intervals: normal  ST/T Wave abnormalities: nonspecific ST/T changes  Conduction Disutrbances:first-degree A-V block   Narrative Interpretation:   Old EKG Reviewed: unchanged    MDM   1. Headache   2. AICD (automatic implantable cardiover defibrillator) at end of life, initial encounter    Pt headache is better at this time:while pt was here she stated that her aicd was shocking her:the device was interrogated and it is on eri status which means that device needs to be changed:discussed with Trish from [facility] and pt is okay to go home and they will arrange follow up    Teressa Lower, NP 05/15/13 1612  Teressa Lower, NP 05/15/13 1614

## 2013-05-22 ENCOUNTER — Telehealth: Payer: Self-pay | Admitting: Internal Medicine

## 2013-05-22 NOTE — Telephone Encounter (Signed)
Called pt re [facility], they show pt reached eri 05-08-13 , we show pt no longer coming here that [place], she needs to contact that dr and let him know, she didn't seem to understand so I spoke to pt's grandmother who verbalized understanding

## 2013-05-31 ENCOUNTER — Encounter (HOSPITAL_BASED_OUTPATIENT_CLINIC_OR_DEPARTMENT_OTHER): Payer: Self-pay | Admitting: Emergency Medicine

## 2013-05-31 ENCOUNTER — Emergency Department (HOSPITAL_BASED_OUTPATIENT_CLINIC_OR_DEPARTMENT_OTHER): Payer: Medicare Other

## 2013-05-31 ENCOUNTER — Observation Stay (HOSPITAL_BASED_OUTPATIENT_CLINIC_OR_DEPARTMENT_OTHER)
Admission: EM | Admit: 2013-05-31 | Discharge: 2013-06-01 | Disposition: A | Discharge status: Home / Self Care | Payer: Medicare Other | Attending: Internal Medicine | Admitting: Internal Medicine

## 2013-05-31 DIAGNOSIS — Z7901 Long term (current) use of anticoagulants: Secondary | ICD-10-CM | POA: Insufficient documentation

## 2013-05-31 DIAGNOSIS — R519 Headache, unspecified: Secondary | ICD-10-CM

## 2013-05-31 DIAGNOSIS — Z91199 Patient's noncompliance with other medical treatment and regimen due to unspecified reason: Secondary | ICD-10-CM

## 2013-05-31 DIAGNOSIS — E119 Type 2 diabetes mellitus without complications: Secondary | ICD-10-CM | POA: Insufficient documentation

## 2013-05-31 DIAGNOSIS — I509 Heart failure, unspecified: Secondary | ICD-10-CM | POA: Insufficient documentation

## 2013-05-31 DIAGNOSIS — Z8673 Personal history of transient ischemic attack (TIA), and cerebral infarction without residual deficits: Secondary | ICD-10-CM | POA: Insufficient documentation

## 2013-05-31 DIAGNOSIS — I5022 Chronic systolic (congestive) heart failure: Secondary | ICD-10-CM

## 2013-05-31 DIAGNOSIS — I428 Other cardiomyopathies: Secondary | ICD-10-CM | POA: Insufficient documentation

## 2013-05-31 DIAGNOSIS — R0789 Other chest pain: Principal | ICD-10-CM | POA: Insufficient documentation

## 2013-05-31 DIAGNOSIS — R569 Unspecified convulsions: Secondary | ICD-10-CM | POA: Insufficient documentation

## 2013-05-31 DIAGNOSIS — E039 Hypothyroidism, unspecified: Secondary | ICD-10-CM

## 2013-05-31 DIAGNOSIS — Z9119 Patient's noncompliance with other medical treatment and regimen: Secondary | ICD-10-CM

## 2013-05-31 DIAGNOSIS — R079 Chest pain, unspecified: Secondary | ICD-10-CM | POA: Diagnosis present

## 2013-05-31 DIAGNOSIS — I4891 Unspecified atrial fibrillation: Secondary | ICD-10-CM

## 2013-05-31 DIAGNOSIS — R51 Headache: Secondary | ICD-10-CM | POA: Insufficient documentation

## 2013-05-31 DIAGNOSIS — D6859 Other primary thrombophilia: Secondary | ICD-10-CM

## 2013-05-31 DIAGNOSIS — G40909 Epilepsy, unspecified, not intractable, without status epilepticus: Secondary | ICD-10-CM

## 2013-05-31 LAB — CBC
Hemoglobin: 8.7 g/dL — ABNORMAL LOW (ref 12.0–15.0)
MCV: 83.7 fL (ref 78.0–100.0)
Platelets: 252 10*3/uL (ref 150–400)
RBC: 3.55 MIL/uL — ABNORMAL LOW (ref 3.87–5.11)
WBC: 5.4 10*3/uL (ref 4.0–10.5)

## 2013-05-31 LAB — COMPREHENSIVE METABOLIC PANEL
Albumin: 3.5 g/dL (ref 3.5–5.2)
BUN: 19 mg/dL (ref 6–23)
Creatinine, Ser: 1.2 mg/dL — ABNORMAL HIGH (ref 0.50–1.10)
Potassium: 3.7 mEq/L (ref 3.5–5.1)
Total Protein: 7.9 g/dL (ref 6.0–8.3)

## 2013-05-31 LAB — URINALYSIS, ROUTINE W REFLEX MICROSCOPIC
Ketones, ur: NEGATIVE mg/dL
Leukocytes, UA: NEGATIVE
Nitrite: NEGATIVE
Protein, ur: 30 mg/dL — AB
pH: 5.5 (ref 5.0–8.0)

## 2013-05-31 LAB — URINE MICROSCOPIC-ADD ON

## 2013-05-31 LAB — GLUCOSE, CAPILLARY

## 2013-05-31 LAB — LIPASE, BLOOD: Lipase: 11 U/L (ref 11–59)

## 2013-05-31 MED ORDER — LORAZEPAM 2 MG/ML IJ SOLN
1.0000 mg | Freq: Once | INTRAMUSCULAR | Status: AC
  Administered 2013-05-31: 1 mg via INTRAVENOUS
  Filled 2013-05-31: qty 1

## 2013-05-31 MED ORDER — METOCLOPRAMIDE HCL 5 MG/ML IJ SOLN
10.0000 mg | Freq: Once | INTRAMUSCULAR | Status: AC
  Administered 2013-05-31: 10 mg via INTRAVENOUS
  Filled 2013-05-31: qty 2

## 2013-05-31 MED ORDER — ASPIRIN 81 MG PO CHEW
324.0000 mg | CHEWABLE_TABLET | Freq: Once | ORAL | Status: AC
  Administered 2013-05-31: 324 mg via ORAL
  Filled 2013-05-31: qty 4

## 2013-05-31 MED ORDER — DIPHENHYDRAMINE HCL 50 MG/ML IJ SOLN
25.0000 mg | Freq: Once | INTRAMUSCULAR | Status: AC
  Administered 2013-05-31: 25 mg via INTRAVENOUS
  Filled 2013-05-31: qty 1

## 2013-05-31 NOTE — ED Notes (Signed)
Patient transported to CT 

## 2013-05-31 NOTE — ED Notes (Signed)
MD at bedside. 

## 2013-05-31 NOTE — Progress Notes (Signed)
[facility] PROGRESS NOTE  Elizabeth Love  ZOX:[id number]  DOB: 26-Nov-1955  DOA: 05/31/2013 PCP: [name], [name], MD  HPI/Subjective: Pt with PMH of CHF, Hypertension, DVT. Presented with chest pain.  Data Reviewed: EKG no acute ischemic changes.  Troponin negative.  Assessment and Plan: Currently chest pain free. No hemodynamic instability. Due to prior medical history, accepted for admission for observation and ACS rule out.   Author: Lynden Oxford, MD [facility] Pager: [phone] 05/31/2013 10:27 PM   If 7PM-7AM, please contact night-coverage at [email or web address], password [email or web address]

## 2013-05-31 NOTE — ED Notes (Addendum)
Report called to [place] on 3W at [facility].  Attempted to call brother- number disconnected.

## 2013-05-31 NOTE — ED Notes (Signed)
Pt given 1mg  for CT per MD order-

## 2013-05-31 NOTE — ED Provider Notes (Addendum)
CSN: [id number]     Arrival date & time 05/31/13  1636 History  This chart was scribed for Dagmar Hait, MD by Ronal Fear, ED Scribe. This patient was seen in room MH11/MH11 and the patient's care was started at 5:26 PM.    Chief Complaint  Patient presents with  . Chest Pain   Patient is a 57 y.o. female presenting with chest pain and headaches. The history is provided by the patient and a relative. No language interpreter was used.  Chest Pain Pain location:  Substernal area Pain quality: crushing   Pain radiates to:  Does not radiate Pain radiates to the back: no   Pain severity:  Moderate Duration:  2 days Timing:  Constant Progression:  Unchanged Relieved by:  Nothing Worsened by:  Nothing tried Ineffective treatments:  None tried Associated symptoms: abdominal pain, cough and headache   Associated symptoms: no back pain and no nausea   Abdominal pain:    Location:  Generalized   Severity:  Mild Cough:    Cough characteristics:  Productive   Sputum characteristics:  Clear   Severity:  Mild Headaches:    Severity:  Moderate   Onset quality:  Gradual   Timing:  Constant   Progression:  Unchanged Headache Pain location:  L parietal Onset quality:  Gradual Duration:  9 hours Progression:  Worsening Relieved by:  Nothing Worsened by:  Nothing tried Associated symptoms: abdominal pain and cough   Associated symptoms: no back pain and no nausea   cluster HA's from diabetes. Takes percocet pt is off of one of her heart meds. Pt has COPD  Past Medical History  Diagnosis Date  . Seizures   . Hypertension   . Stroke     2007 - R sided weakness and expressive aphasia with h/o behavioral problems related to this  . Asthma   . Diabetes mellitus   . 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 Chronic systolic CHF (congestive heart failure)     a. s/p AICD ~2007 ([employer]) - previous care [facility]. No known hx CAD. b. EF 10% by echo 04/2012.  Marland Kitchen Atrial  fibrillation     04/2012 - not felt to be an anticoag candidate long term at that time, but later had DVT so was on placed on Xarelto  . Coagulopathy     "Protein C deficiency"  . History of DVT (deep vein thrombosis)     a. 06/2012: RLE DVT, + protein C deficiency, placed on Xarelto. s/p IVC filter  . Noncompliance     Due to mental status, family has had to coax her to take medicines  . Hypothyroidism   . VT (ventricular tachycardia)     a. h/o ICD ~2007-2008. b. Adm 08/2012 with UTI, had VT/ICD shock x 5 (hypokalemic)  . Protein C deficiency    Past Surgical History  Procedure Laterality Date  . Cholecystectomy    . Appendectomy    . Pacemaker insertion    . Vena cava filter placement  07/19/2012    Procedure: INSERTION VENA-CAVA FILTER;  Surgeon: Larina Earthly, MD;  Location: [facility] OR;  Service: Vascular;  Laterality: [name];  . Insert / replace / remove pacemaker      ICD  . Abdominal hysterectomy     Family History  Problem Relation Age of Onset  . Coronary artery disease Neg Hx   . Hypertension Mother    History  Substance Use Topics  .  Smoking status: Former Games developer  . Smokeless tobacco: Not on file  . Alcohol Use: No   OB History   Grav Para Term Preterm Abortions TAB SAB Ect Mult Living                 Review of Systems  Respiratory: Positive for cough.   Cardiovascular: Positive for chest pain.  Gastrointestinal: Positive for abdominal pain. Negative for nausea.  Musculoskeletal: Negative for back pain.  Neurological: Positive for headaches.  All other systems reviewed and are negative.    Allergies  Penicillins and Sulfa antibiotics  Home Medications   No current outpatient prescriptions on file. BP 109/58  Pulse 79  Temp(Src) 97.1 F (36.2 C) (Oral)  Resp 20  Ht 5\' 4"  (1.626 m)  Wt 115 lb (52.164 kg)  BMI 19.73 kg/m2  SpO2 99% Physical Exam  Nursing note and vitals reviewed. Constitutional: She is oriented to person, place, and time. She appears  well-developed and well-nourished. No distress.  HENT:  Head: Normocephalic and atraumatic.  Eyes: EOM are normal.  Neck: Neck supple. No tracheal deviation present.  Pulmonary/Chest: Effort normal. No respiratory distress. She has wheezes (mild scattered). She has no rales.  Abdominal: There is tenderness (mild diffuse ). There is no rebound and no guarding.  Musculoskeletal: Normal range of motion.  Neurological: She is alert and oriented to person, place, and time.  Skin: Skin is warm and dry.  Psychiatric: She has a normal mood and affect. Her behavior is normal.    ED Course  Procedures (including critical care time)  DIAGNOSTIC STUDIES: Oxygen Saturation is 99% on RA, normal by my interpretation.    COORDINATION OF CARE: 5:51 PM- Pt advised of plan for treatment including Ct of her head, CBC and urinalysis and pt agrees.      Labs Review Labs Reviewed  PRO B NATRIURETIC PEPTIDE - Abnormal; Notable for the following:    Pro B Natriuretic peptide (BNP) 4747.0 (*)    All other components within normal limits  CBC - Abnormal; Notable for the following:    RBC 3.55 (*)    Hemoglobin 8.7 (*)    HCT 29.7 (*)    MCH 24.5 (*)    MCHC 29.3 (*)    RDW 18.4 (*)    All other components within normal limits  GLUCOSE, CAPILLARY - Abnormal; Notable for the following:    Glucose-Capillary 127 (*)    All other components within normal limits  URINALYSIS, ROUTINE W REFLEX MICROSCOPIC - Abnormal; Notable for the following:    Color, Urine AMBER (*)    Specific Gravity, Urine 1.031 (*)    Bilirubin Urine SMALL (*)    Protein, ur 30 (*)    All other components within normal limits  COMPREHENSIVE METABOLIC PANEL - Abnormal; Notable for the following:    Glucose, Bld 134 (*)    Creatinine, Ser 1.20 (*)    GFR calc non Af Amer 50 (*)    GFR calc Af Amer 58 (*)    All other components within normal limits  URINE MICROSCOPIC-ADD ON - Abnormal; Notable for the following:    Bacteria,  UA FEW (*)    Casts HYALINE CASTS (*)    All other components within normal limits  TROPONIN I  LIPASE, BLOOD  GLUCOSE, CAPILLARY  CG4 I-STAT (LACTIC ACID)   Imaging Review Dg Chest 2 View  05/31/2013   CLINICAL DATA:  Chest pain.  EXAM: CHEST  2 VIEW  COMPARISON:  05/15/2013  FINDINGS: Cardiomegaly. Left AICD is unchanged. Vascular congestion. Diffuse interstitial prominence is stable since prior study an dating back to 04/22/2012, likely chronic interstitial lung disease. It is difficult to completely exclude edema. No confluent opacities or effusions. No acute bony abnormality.  IMPRESSION: Stable cardiomegaly and vascular congestion.  Stable interstitial prominence, likely chronic interstitial disease.   Electronically Signed   By: Charlett Nose M.D.   On: 05/31/2013 18:53   Ct Head Wo Contrast  05/31/2013   CLINICAL DATA:  Right-sided weakness from prior CVA. Trauma.  EXAM: CT HEAD WITHOUT CONTRAST  TECHNIQUE: Contiguous axial images were obtained from the base of the skull through the vertex without intravenous contrast.  COMPARISON:  05/15/2013  FINDINGS: Changes of large old left MCA infarct with encephalomalacia throughout much of the left cerebral hemisphere. Associated ex vacuo dilatation of the left lateral ventricle, stable. No acute infarction or hemorrhage. No mass effect or midline shift. No mass lesion. Visualized paranasal sinuses and mastoids clear. Orbital soft tissues unremarkable.  IMPRESSION: Old left MCA infarct.  No acute findings.   Electronically Signed   By: Charlett Nose M.D.   On: 05/31/2013 19:31     Date: 05/31/2013  Rate: 73  Rhythm: normal sinus rhythm  QRS Axis: normal  Intervals: PR prolonged  ST/T Wave abnormalities: flipped T waves V5-V6 - similar to previous  Conduction Disutrbances:first-degree A-V block   Narrative Interpretation:   Old EKG Reviewed: unchanged   MDM   1. Chest pain   2. Headache    48F with hx of CHF, DM, Afib, CVA, Asthma  presents with headache and chest pain. Patient is difficult historian with speech difficulties secondary to prior stroke. Brother is helping to translate, but doesn't provide good answers. Record review shows prior echo of EF of 10%. Chest pain - hx of similar, states it feels like prior MI. Intermittent chest pain prior to arrival. Some associated SOB and cough, nonproductive. Will get CXR, labs. EKG similar to prior. Headache - hx of migraines, no trauma, L sided - brother states prior hx of cluster headaches - headache cocktail given. Since on Xarelto, will CT head Labs normal here. BNP elevated, however is elevated as her chronic BNP elevations. Troponin normal. Head CT normal. Chest x-ray without infiltrate shows chronic changes likely associated amiodarone. Admitted to medicine and transferred to [facility] for chest pain rule out.  I have reviewed all labs and imaging and considered them in my medical decision making.  I personally performed the services described in this documentation, which was scribed in my presence. The recorded information has been reviewed and is accurate.     Dagmar Hait, MD 05/31/13 2350  Dagmar Hait, MD 05/31/13 2350

## 2013-05-31 NOTE — ED Notes (Signed)
Patient transported to X-ray 

## 2013-05-31 NOTE — ED Notes (Signed)
Assigned to room 3W27 @ [facility], RN notified

## 2013-05-31 NOTE — ED Notes (Signed)
C/o headache, chest pain.  Onset this am.  Brother states patient has been out of her 'heart medicine' for one week.

## 2013-06-01 ENCOUNTER — Encounter (HOSPITAL_COMMUNITY): Payer: Self-pay | Admitting: Internal Medicine

## 2013-06-01 DIAGNOSIS — I4891 Unspecified atrial fibrillation: Secondary | ICD-10-CM

## 2013-06-01 DIAGNOSIS — G40909 Epilepsy, unspecified, not intractable, without status epilepticus: Secondary | ICD-10-CM

## 2013-06-01 DIAGNOSIS — D6859 Other primary thrombophilia: Secondary | ICD-10-CM

## 2013-06-01 DIAGNOSIS — R079 Chest pain, unspecified: Secondary | ICD-10-CM | POA: Diagnosis present

## 2013-06-01 DIAGNOSIS — Z9119 Patient's noncompliance with other medical treatment and regimen: Secondary | ICD-10-CM

## 2013-06-01 LAB — BASIC METABOLIC PANEL
Calcium: 9.4 mg/dL (ref 8.4–10.5)
GFR calc non Af Amer: 59 mL/min — ABNORMAL LOW (ref >90)
Potassium: 3.5 mEq/L (ref 3.5–5.1)
Sodium: 139 mEq/L (ref 135–145)

## 2013-06-01 LAB — GLUCOSE, CAPILLARY
Glucose-Capillary: 126 mg/dL — ABNORMAL HIGH (ref 70–99)
Glucose-Capillary: 133 mg/dL — ABNORMAL HIGH (ref 70–99)

## 2013-06-01 LAB — CBC WITH DIFFERENTIAL/PLATELET
Basophils Absolute: 0 10*3/uL (ref 0.0–0.1)
Lymphocytes Relative: 34 % (ref 12–46)
Neutro Abs: 2.7 10*3/uL (ref 1.7–7.7)
Neutrophils Relative %: 56 % (ref 43–77)
Platelets: 242 10*3/uL (ref 150–400)
RBC: 3.58 MIL/uL — ABNORMAL LOW (ref 3.87–5.11)
RDW: 18.1 % — ABNORMAL HIGH (ref 11.5–15.5)
WBC: 4.9 10*3/uL (ref 4.0–10.5)

## 2013-06-01 LAB — MAGNESIUM: Magnesium: 1.9 mg/dL (ref 1.5–2.5)

## 2013-06-01 MED ORDER — LEVETIRACETAM 500 MG PO TABS
1000.0000 mg | ORAL_TABLET | Freq: Two times a day (BID) | ORAL | Status: DC
  Filled 2013-06-01 (×3): qty 2

## 2013-06-01 MED ORDER — OMEPRAZOLE 40 MG PO CPDR: 40.0000 mg | DELAYED_RELEASE_CAPSULE | Freq: Two times a day (BID) | ORAL | Status: DC

## 2013-06-01 MED ORDER — IPRATROPIUM BROMIDE 0.02 % IN SOLN
0.5000 mg | Freq: Four times a day (QID) | RESPIRATORY_TRACT | Status: DC
  Administered 2013-06-01: 0.5 mg via RESPIRATORY_TRACT
  Filled 2013-06-01 (×2): qty 2.5

## 2013-06-01 MED ORDER — RIVAROXABAN 20 MG PO TABS
20.0000 mg | ORAL_TABLET | Freq: Every day | ORAL | Status: DC
  Filled 2013-06-01: qty 1

## 2013-06-01 MED ORDER — OXYCODONE HCL 5 MG PO TABS
5.0000 mg | ORAL_TABLET | Freq: Four times a day (QID) | ORAL | Status: DC | PRN
  Administered 2013-06-01: 5 mg via ORAL
  Filled 2013-06-01 (×2): qty 1

## 2013-06-01 MED ORDER — METOPROLOL SUCCINATE ER 25 MG PO TB24
25.0000 mg | ORAL_TABLET | Freq: Every day | ORAL | Status: DC
  Filled 2013-06-01: qty 1

## 2013-06-01 MED ORDER — ONDANSETRON HCL 4 MG/2ML IJ SOLN: 4.0000 mg | Freq: Four times a day (QID) | INTRAMUSCULAR | Status: DC | PRN

## 2013-06-01 MED ORDER — FUROSEMIDE 40 MG PO TABS
40.0000 mg | ORAL_TABLET | Freq: Two times a day (BID) | ORAL | Status: DC
  Administered 2013-06-01: 40 mg via ORAL
  Filled 2013-06-01 (×3): qty 1

## 2013-06-01 MED ORDER — SPIRONOLACTONE 25 MG PO TABS
25.0000 mg | ORAL_TABLET | Freq: Every day | ORAL | Status: DC
  Filled 2013-06-01: qty 1

## 2013-06-01 MED ORDER — OXYCODONE-ACETAMINOPHEN 10-325 MG PO TABS: 1.0000 | ORAL_TABLET | Freq: Four times a day (QID) | ORAL | Status: DC | PRN

## 2013-06-01 MED ORDER — ALPRAZOLAM 0.5 MG PO TABS: 0.5000 mg | ORAL_TABLET | Freq: Every day | ORAL | Status: DC | PRN

## 2013-06-01 MED ORDER — ALBUTEROL SULFATE (5 MG/ML) 0.5% IN NEBU: 2.5000 mg | INHALATION_SOLUTION | RESPIRATORY_TRACT | Status: DC | PRN

## 2013-06-01 MED ORDER — LEVOTHYROXINE SODIUM 50 MCG PO TABS
50.0000 ug | ORAL_TABLET | Freq: Every day | ORAL | Status: DC
  Administered 2013-06-01: 50 ug via ORAL
  Filled 2013-06-01 (×2): qty 1

## 2013-06-01 MED ORDER — PANTOPRAZOLE SODIUM 40 MG PO TBEC: 40.0000 mg | DELAYED_RELEASE_TABLET | Freq: Every day | ORAL | Status: DC

## 2013-06-01 MED ORDER — ALBUTEROL SULFATE (5 MG/ML) 0.5% IN NEBU
2.5000 mg | INHALATION_SOLUTION | RESPIRATORY_TRACT | Status: DC
  Administered 2013-06-01: 2.5 mg via RESPIRATORY_TRACT
  Filled 2013-06-01: qty 0.5

## 2013-06-01 MED ORDER — ACETAMINOPHEN 325 MG PO TABS: 650.0000 mg | ORAL_TABLET | ORAL | Status: DC | PRN

## 2013-06-01 MED ORDER — DIGOXIN 125 MCG PO TABS
0.1250 mg | ORAL_TABLET | Freq: Every day | ORAL | Status: DC
  Filled 2013-06-01: qty 1

## 2013-06-01 MED ORDER — POTASSIUM CHLORIDE CRYS ER 20 MEQ PO TBCR
20.0000 meq | EXTENDED_RELEASE_TABLET | Freq: Two times a day (BID) | ORAL | Status: DC
  Administered 2013-06-01: 20 meq via ORAL
  Filled 2013-06-01 (×3): qty 1

## 2013-06-01 MED ORDER — AMIODARONE HCL 200 MG PO TABS
200.0000 mg | ORAL_TABLET | Freq: Every day | ORAL | Status: DC
  Filled 2013-06-01: qty 1

## 2013-06-01 MED ORDER — PANTOPRAZOLE SODIUM 40 MG PO TBEC
40.0000 mg | DELAYED_RELEASE_TABLET | Freq: Every day | ORAL | Status: DC
  Filled 2013-06-01: qty 1

## 2013-06-01 MED ORDER — OXYCODONE-ACETAMINOPHEN 5-325 MG PO TABS
1.0000 | ORAL_TABLET | Freq: Four times a day (QID) | ORAL | Status: DC | PRN
  Administered 2013-06-01 (×2): 1 via ORAL
  Filled 2013-06-01 (×3): qty 1

## 2013-06-01 MED ORDER — LISINOPRIL 10 MG PO TABS
10.0000 mg | ORAL_TABLET | Freq: Every day | ORAL | Status: DC
  Filled 2013-06-01: qty 1

## 2013-06-01 MED ORDER — POLYETHYLENE GLYCOL 3350 17 G PO PACK
17.0000 g | PACK | Freq: Every day | ORAL | Status: DC | PRN
  Filled 2013-06-01: qty 1

## 2013-06-01 MED ORDER — ALBUTEROL SULFATE (5 MG/ML) 0.5% IN NEBU
2.5000 mg | INHALATION_SOLUTION | Freq: Four times a day (QID) | RESPIRATORY_TRACT | Status: DC
  Administered 2013-06-01: 2.5 mg via RESPIRATORY_TRACT
  Filled 2013-06-01 (×2): qty 0.5

## 2013-06-01 NOTE — Progress Notes (Signed)
UR Completed.  Elizabeth Love Jane [phone] 06/01/2013  

## 2013-06-01 NOTE — Discharge Summary (Signed)
Physician Discharge Summary  Elizabeth Love AOZ:[id number] DOB: 24-May-1956 DOA: 05/31/2013  PCP: [name], [name], MD  Admit date: 05/31/2013 Discharge date: 06/01/2013  Time spent: >30 minutes  Recommendations for Outpatient Follow-up:  1. CBC and BMET; to follow Hgb, electrolytes and kidney function. 2. Make sure patient follow with cardiology as instructed  Discharge Diagnoses:  Principal Problem:   Chest pain Active Problems:   Chronic systolic heart failure   Atrial fibrillation   Cardiomyopathy   Headache Seizure  Discharge Condition: stable and improved. Discharge home with family care  Diet recommendation: heart healthy diet  Filed Weights   05/31/13 1713  Weight: 52.164 kg (115 lb)    History of present illness:  57 y.o. female with history of chronic systolic heart failure last EF was 10%, atrial fibrillation, CVA with right-sided hemiparesis, hypothyroidism, seizures and history of protein C deficiency on xarelto; presents to the ER because of chest pain. Patient has expressive aphasia and has difficulty communicating. Patient says that she's been having 3 days of chest pain which patient points to the sternal area. Patient states that sometimes it radiates and last for a few minutes. Patient also has been having cough. Denies any fever chills. In addition patient has been having headaches. In the ER CT head did not show anything acute. EKG and cardiac markers were negative. Patient has been admitted for further management.   Hospital Course:  1. Chest pain - non cardiac. Negative CE'z, no signs of acute ischemia on telemetry or EKG. Will adjust dose of PPI in case pain due to GERD. Appointment will be arranged by cardiology to follow for heart failure and ICD adjustments. 2. Headache - continue with when necessary Percocet. CT head did not show anything acute. Most likely due to her migraines. 3. Chronic systolic heart failure - continue Lasix spironolactone.  Patient is also on when necessary metolazone. Compensated. Will continue b-blockers and ACE inhibitor. Patient s/p ICD 4. A. Fib - presently in sinus rhythm and rate controlled. On xarelto. Will continue digoxin and b-blocker 5. History of CVA: no new focal deficits 6. History of protein C deficiency: continue xarelto. 7. History of hypothyroidism - continue Synthroid. 8. Seizure: continue keppra.   Procedures:  See below for x-ray reports  Consultations:  Cardiology (Dr. Myrtis Ser curbside to assist arranging follow in outpatient setting for further evaluation and treatment)  Discharge Exam: Filed Vitals:   06/01/13 0539  BP: 99/42  Pulse: 78  Temp: 98 F (36.7 C)  Resp: 17    General: no SOB, mild discomfort on epigastric area; afebrile Cardiovascular: no JVD, regular rate, positive murmur, no rubs or gallops Respiratory: CTA bilaterally Abdomen:soft, no guarding, positive BS, no distension Neuro: no new focal deficit; chronic right hemiparesis and speech aphasia/dysarthria unchanged.  Discharge Instructions  Discharge Orders   Future Appointments Provider Department Dept Phone   06/18/2013 2:20 PM [facility] [phone]   Future Orders Complete By Expires   Discharge instructions  As directed    Comments:     Be compliant with medications and follow up appointments Take medications as prescribed Follow a heart healthy diet Watch your sodium and fluid intake       Medication List         acetaminophen 325 MG tablet  Commonly known as:  TYLENOL  Take 650 mg by mouth every 6 (six) hours as needed for pain.     albuterol (2.5 MG/3ML) 0.083%  nebulizer solution  Commonly known as:  PROVENTIL  Take 2.5 mg by nebulization every 6 (six) hours as needed. For cough or wheeze     ALPRAZolam 0.5 MG tablet  Commonly known as:  XANAX  Take 0.5 mg by mouth daily as needed for sleep or anxiety. For sleep or anxiety      amiodarone 400 MG tablet  Commonly known as:  PACERONE  Take 0.5 tablets (200 mg total) by mouth daily.     digoxin 0.125 MG tablet  Commonly known as:  LANOXIN  Take 1 tablet (0.125 mg total) by mouth daily.     furosemide 40 MG tablet  Commonly known as:  LASIX  TAKE 1 TABLET BY MOUTH TWICE DAILY     levETIRAcetam 500 MG tablet  Commonly known as:  KEPPRA  Take 1,000 mg by mouth 2 (two) times daily.     levothyroxine 50 MCG tablet  Commonly known as:  SYNTHROID, LEVOTHROID  Take 50 mcg by mouth daily.     lisinopril 10 MG tablet  Commonly known as:  PRINIVIL,ZESTRIL  Take 1 tablet (10 mg total) by mouth daily.     metolazone 2.5 MG tablet  Commonly known as:  ZAROXOLYN  Take 2.5 mg by mouth daily as needed. For fluid retention     metoprolol succinate 25 MG 24 hr tablet  Commonly known as:  TOPROL-XL  Take 25 mg by mouth daily.     omeprazole 20 MG capsule  Commonly known as:  PRILOSEC  Take 20 mg by mouth 2 (two) times daily.     oxyCODONE-acetaminophen 10-325 MG per tablet  Commonly known as:  PERCOCET  Take 1 tablet by mouth every 6 (six) hours as needed for pain. Scheduled per pharmacy     polyethylene glycol packet  Commonly known as:  MIRALAX / GLYCOLAX  Take 17 g by mouth daily as needed. For constipation     potassium chloride SA 20 MEQ tablet  Commonly known as:  K-DUR,KLOR-CON  Take 20 mEq by mouth 2 (two) times daily.     Rivaroxaban 20 MG Tabs tablet  Commonly known as:  XARELTO  Take 1 tablet (20 mg total) by mouth daily with supper.     spironolactone 25 MG tablet  Commonly known as:  ALDACTONE  Take 1 tablet (25 mg total) by mouth daily.       Allergies  Allergen Reactions  . Penicillins Rash  . Sulfa Antibiotics Rash       Follow-up Information   Follow up with [name], [name], MD. Schedule an appointment as soon as possible for a visit in 1 week.   Specialty:  Internal Medicine   Contact information:   [address] [place] [address] [phone]       Follow up with [facility]. (please call office to confirm appointment and to follow  with them)    Specialty:  Cardiology   Contact information:   [address] [id number] [place] [address] [phone]      The results of significant diagnostics from this hospitalization (including imaging, microbiology, ancillary and laboratory) are listed below for reference.    Significant Diagnostic Studies: Dg Chest 2 View  05/31/2013   CLINICAL DATA:  Chest pain.  EXAM: CHEST  2 VIEW  COMPARISON:  05/15/2013  FINDINGS: Cardiomegaly. Left AICD is unchanged. Vascular congestion. Diffuse interstitial prominence is stable since prior study an dating back  to 04/22/2012, likely chronic interstitial lung disease. It is difficult to completely exclude edema. No confluent opacities or effusions. No acute bony abnormality.  IMPRESSION: Stable cardiomegaly and vascular congestion.  Stable interstitial prominence, likely chronic interstitial disease.   Electronically Signed   By: Charlett Nose M.D.   On: 05/31/2013 18:53   Dg Chest 2 View  05/15/2013   *RADIOLOGY REPORT*  Clinical Data: Defibrillator activation.  CHEST - 2 VIEW  Comparison: 04/14/2013.  Findings: The pacer wires / AICD are stable.  The heart is markedly enlarged but stable.  There is chronic vascular congestion and chronic bronchitic lung changes.  No focal infiltrates or pneumothorax.  IMPRESSION: Stable position of the pacer wires / AICD. Stable cardiac enlargement. Chronic lung changes without definite acute overlying pulmonary process.   Original Report Authenticated By: Rudie Meyer, M.D.   Ct Head Wo Contrast  05/31/2013   CLINICAL DATA:  Right-sided weakness from prior CVA. Trauma.  EXAM: CT HEAD WITHOUT CONTRAST  TECHNIQUE: Contiguous axial images were obtained from the base of the skull through the vertex without intravenous  contrast.  COMPARISON:  05/15/2013  FINDINGS: Changes of large old left MCA infarct with encephalomalacia throughout much of the left cerebral hemisphere. Associated ex vacuo dilatation of the left lateral ventricle, stable. No acute infarction or hemorrhage. No mass effect or midline shift. No mass lesion. Visualized paranasal sinuses and mastoids clear. Orbital soft tissues unremarkable.  IMPRESSION: Old left MCA infarct.  No acute findings.   Electronically Signed   By: Charlett Nose M.D.   On: 05/31/2013 19:31   Ct Head Wo Contrast  05/15/2013   *RADIOLOGY REPORT*  Clinical Data: Headache.  CT HEAD WITHOUT CONTRAST  Technique:  Contiguous axial images were obtained from the base of the skull through the vertex without contrast.  Comparison: 04/14/2013.  Findings: Large remote left MCA territory infarct with encephalomalacia and ex vacuo dilatation of the left lateral ventricle.  No new/acute intracranial findings.  No extra-axial fluid collections.  No intracranial hemorrhage or mass lesion.  The brainstem and cerebellum appear stable.  Wallerian degeneration of the left pons is noted.  The bony structures are intact.  No destructive bone lesions or acute fracture.  The paranasal sinuses and mastoid air cells are clear except for scattered ethmoid disease.  The globes are intact.  IMPRESSION:  1.  Remote large left MCA territory infarct with encephalomalacia and ex vacuo dilatation of the left lateral ventricle. 2.  No new/acute intracranial findings.   Original Report Authenticated By: Rudie Meyer, M.D.   Labs: Basic Metabolic Panel:  Recent Labs Lab 05/31/13 1730 06/01/13 0350  NA 142 139  K 3.7 3.5  CL 103 103  CO2 25 25  GLUCOSE 134* 96  BUN 19 18  CREATININE 1.20* 1.05  CALCIUM 10.0 9.4  MG  --  1.9   Liver Function Tests:  Recent Labs Lab 05/31/13 1730  AST 29  ALT 13  ALKPHOS 76  BILITOT 0.9  PROT 7.9  ALBUMIN 3.5    Recent Labs Lab 05/31/13 1730  LIPASE 11    CBC:  Recent Labs Lab 05/31/13 1730 06/01/13 0350  WBC 5.4 4.9  NEUTROABS  --  2.7  HGB 8.7* 8.8*  HCT 29.7* 29.7*  MCV 83.7 83.0  PLT 252 242   Cardiac Enzymes:  Recent Labs Lab 05/31/13 1730 06/01/13 0350  TROPONINI <0.30 <0.30   BNP: BNP (last 3 results)  Recent Labs  04/12/13 0500 04/14/13 0430 05/31/13 1730  PROBNP 21955.0* 5776.0* 4747.0*   CBG:  Recent Labs Lab 05/31/13 1719 05/31/13 2317 06/01/13 0747  GLUCAP 127* 90 126*    Signed:  Phelix Fudala  [facility] 06/01/2013, 10:06 AM

## 2013-06-01 NOTE — Progress Notes (Signed)
Pt had 7 beats of Vtach and was asymptomatic. Dr. Toniann Fail made aware. MD placing orders. Will cont to monitor.

## 2013-06-01 NOTE — H&P (Signed)
[facility] History and Physical  Elizabeth Love AVW:[id number] DOB: [birth year] DOA: 05/31/2013  Referring physician: ER physician. Patient was transferred from [facility]. PCP: Estill Cotta, MD   Chief Complaint: Chest pain and headache.  HPI: Elizabeth Love is a 57 y.o. female with history of chronic systolic heart failure last EF was 10%, atrial fibrillation, CVA with right-sided hemiparesis, hypothyroidism, seizures and history of protein C deficiency on xarelto presents to the ER because of chest pain. Patient has expressive aphasia and has difficulty communicating. Patient says that she's been having 3 days of chest pain which patient points to the sternal area. Patient states that sometimes it radiates and last for a few minutes. Patient also has been having cough. Denies any fever chills. In addition patient has been having headaches. In the ER CT head did not show anything acute. EKG and cardiac markers were negative. Patient has been admitted for further management.  Review of Systems: As presented in the history of presenting illness, rest negative.  Past Medical History  Diagnosis Date  . Seizures   . Hypertension   . Stroke     2007 - R sided weakness and expressive aphasia with h/o behavioral problems related to this  . Asthma   . Diabetes mellitus   . 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 Chronic systolic CHF (congestive heart failure)     a. s/p AICD ~2007 ([employer]) - previous care [place]. No known hx CAD. b. EF 10% by echo 04/2012.  Marland Kitchen Atrial fibrillation     04/2012 - not felt to be an anticoag candidate long term at that time, but later had DVT so was on placed on Xarelto  . Coagulopathy     "Protein C deficiency"  . History of DVT (deep vein thrombosis)     a. 06/2012: RLE DVT, + protein C deficiency, placed on Xarelto. s/p IVC filter  . Noncompliance     Due to mental status, family has had to coax her to take medicines  .  Hypothyroidism   . VT (ventricular tachycardia)     a. h/o ICD ~2007-2008. b. Adm 08/2012 with UTI, had VT/ICD shock x 5 (hypokalemic)  . Protein C deficiency    Past Surgical History  Procedure Laterality Date  . Cholecystectomy    . Appendectomy    . Pacemaker insertion    . Vena cava filter placement  07/19/2012    Procedure: INSERTION VENA-CAVA FILTER;  Surgeon: Larina Earthly, MD;  Location: [facility] OR;  Service: Vascular;  Laterality: [name];  . Insert / replace / remove pacemaker      ICD  . Abdominal hysterectomy     Social History:  reports that she has quit smoking. She does not have any smokeless tobacco history on file. She reports that she does not drink alcohol or use illicit drugs. Home. where does patient live-- No. Can patient participate in ADLs?  Allergies  Allergen Reactions  . Penicillins Rash  . Sulfa Antibiotics Rash    Family History  Problem Relation Age of Onset  . Coronary artery disease Neg Hx   . Hypertension Mother       Prior to Admission medications   Medication Sig Start Date End Date Taking? Authorizing Provider  acetaminophen (TYLENOL) 325 MG tablet Take 650 mg by mouth every 6 (six) hours as needed for pain.    Historical Provider, MD  albuterol (PROVENTIL) (2.5 MG/3ML) 0.083% nebulizer  solution Take 2.5 mg by nebulization every 6 (six) hours as needed. For cough or wheeze    Historical Provider, MD  ALPRAZolam Prudy Feeler) 0.5 MG tablet Take 0.5 mg by mouth daily as needed for sleep or anxiety. For sleep or anxiety    Historical Provider, MD  amiodarone (PACERONE) 400 MG tablet Take 0.5 tablets (200 mg total) by mouth daily. 03/25/13   Dolores Patty, MD  digoxin (LANOXIN) 0.125 MG tablet Take 1 tablet (0.125 mg total) by mouth daily. 01/10/13   Sorin Luanne Bras, MD  furosemide (LASIX) 40 MG tablet Take 40 mg by mouth 2 (two) times daily.    Historical Provider, MD  furosemide (LASIX) 40 MG tablet TAKE 1 TABLET BY MOUTH TWICE DAILY 04/30/13   Dolores Patty, MD  levETIRAcetam (KEPPRA) 500 MG tablet Take 1,000 mg by mouth 2 (two) times daily.    Historical Provider, MD  levothyroxine (SYNTHROID, LEVOTHROID) 50 MCG tablet Take 50 mcg by mouth daily.    Historical Provider, MD  lisinopril (PRINIVIL,ZESTRIL) 10 MG tablet Take 1 tablet (10 mg total) by mouth daily. 01/31/13   Amy D Filbert Schilder, NP  metolazone (ZAROXOLYN) 2.5 MG tablet Take 2.5 mg by mouth daily as needed. For fluid retention    Historical Provider, MD  metoprolol succinate (TOPROL-XL) 25 MG 24 hr tablet Take 25 mg by mouth daily.    Historical Provider, MD  omeprazole (PRILOSEC) 20 MG capsule Take 20 mg by mouth 2 (two) times daily.    Historical Provider, MD  oxyCODONE-acetaminophen (PERCOCET) 10-325 MG per tablet Take 1 tablet by mouth every 6 (six) hours as needed for pain. Scheduled per pharmacy 12/03/12   Osvaldo Shipper, MD  polyethylene glycol [facility] / GLYCOLAX) packet Take 17 g by mouth daily as needed. For constipation    Historical Provider, MD  potassium chloride SA (K-DUR,KLOR-CON) 20 MEQ tablet Take 20 mEq by mouth 2 (two) times daily.    Historical Provider, MD  Rivaroxaban (XARELTO) 20 MG TABS Take 1 tablet (20 mg total) by mouth daily with supper. 02/26/13   Dolores Patty, MD  spironolactone (ALDACTONE) 25 MG tablet Take 1 tablet (25 mg total) by mouth daily. 03/10/13   Dolores Patty, MD   Physical Exam: Filed Vitals:   05/31/13 2000 05/31/13 2100 05/31/13 2200 05/31/13 2320  BP: 113/70 111/58 105/58 110/62  Pulse: 84   73  Temp:    98.7 F (37.1 C)  TempSrc:    Oral  Resp:    17  Height:      Weight:      SpO2: 91%   95%     General:  Poorly nourished not in acute distress.  Eyes: Anicteric no pallor.  ENT: No discharge from the ears eyes nose mouth.  Neck: No mass felt.  Cardiovascular: S1-S2 heard.  Respiratory: No rhonchi or crepitations.  Abdomen: Soft nontender bowel sounds present.  Skin: No rash.  Musculoskeletal: No  edema.  Psychiatric: Appears normal.  Neurologic: Alert and oriented to time place and person. Right-sided hemiparesis.  Labs on Admission:  Basic Metabolic Panel:  Recent Labs Lab 05/31/13 1730  NA 142  K 3.7  CL 103  CO2 25  GLUCOSE 134*  BUN 19  CREATININE 1.20*  CALCIUM 10.0   Liver Function Tests:  Recent Labs Lab 05/31/13 1730  AST 29  ALT 13  ALKPHOS 76  BILITOT 0.9  PROT 7.9  ALBUMIN 3.5    Recent Labs Lab 05/31/13 1730  LIPASE 11   No results found for this basename: AMMONIA,  in the last 168 hours CBC:  Recent Labs Lab 05/31/13 1730  WBC 5.4  HGB 8.7*  HCT 29.7*  MCV 83.7  PLT 252   Cardiac Enzymes:  Recent Labs Lab 05/31/13 1730  TROPONINI <0.30    BNP (last 3 results)  Recent Labs  04/12/13 0500 04/14/13 0430 05/31/13 1730  PROBNP 21955.0* 5776.0* 4747.0*   CBG:  Recent Labs Lab 05/31/13 1719 05/31/13 2317  GLUCAP 127* 90    Radiological Exams on Admission: Dg Chest 2 View  05/31/2013   CLINICAL DATA:  Chest pain.  EXAM: CHEST  2 VIEW  COMPARISON:  05/15/2013  FINDINGS: Cardiomegaly. Left AICD is unchanged. Vascular congestion. Diffuse interstitial prominence is stable since prior study an dating back to 04/22/2012, likely chronic interstitial lung disease. It is difficult to completely exclude edema. No confluent opacities or effusions. No acute bony abnormality.  IMPRESSION: Stable cardiomegaly and vascular congestion.  Stable interstitial prominence, likely chronic interstitial disease.   Electronically Signed   By: Charlett Nose M.D.   On: 05/31/2013 18:53   Ct Head Wo Contrast  05/31/2013   CLINICAL DATA:  Right-sided weakness from prior CVA. Trauma.  EXAM: CT HEAD WITHOUT CONTRAST  TECHNIQUE: Contiguous axial images were obtained from the base of the skull through the vertex without intravenous contrast.  COMPARISON:  05/15/2013  FINDINGS: Changes of large old left MCA infarct with encephalomalacia throughout much of  the left cerebral hemisphere. Associated ex vacuo dilatation of the left lateral ventricle, stable. No acute infarction or hemorrhage. No mass effect or midline shift. No mass lesion. Visualized paranasal sinuses and mastoids clear. Orbital soft tissues unremarkable.  IMPRESSION: Old left MCA infarct.  No acute findings.   Electronically Signed   By: Charlett Nose M.D.   On: 05/31/2013 19:31    EKG: Independently reviewed. Normal sinus rhythm with first degree AV block and incomplete left bundle branch block.  Assessment/Plan Principal Problem:   Chest pain Active Problems:   Chronic systolic heart failure   Atrial fibrillation   Cardiomyopathy   Headache   1. Chest pain - cycle cardiac markers. Patient is on xarelto. 2. Headache - continue with when necessary Percocet. CT head did not show anything acute. 3. Chronic systolic heart failure - continue Lasix spironolactone. Patient is also on when necessary metolazone. 4. A. Fib - presently in sinus rhythm and rate controlled. On xarelto. Check digoxin levels. 5. History of CVA. 6. History of protein C deficiency on xarelto. 7. History of hypothyroidism - continue Synthroid.    Code Status: Full code.  Family Communication: None.  Disposition Plan: Admit for observation.    Ninoshka Wainwright N. [facility] Pager [phone].  If 7PM-7AM, please contact night-coverage [email or web address] Password [facility] 06/01/2013, 2:44 AM

## 2013-06-02 ENCOUNTER — Encounter: Payer: Self-pay | Admitting: *Deleted

## 2013-06-02 ENCOUNTER — Telehealth: Payer: Self-pay | Admitting: *Deleted

## 2013-06-02 ENCOUNTER — Encounter: Payer: Self-pay | Admitting: Cardiology

## 2013-06-02 NOTE — Telephone Encounter (Signed)
[phone] is disconnected or changed. LMOM for [phone] in regards to device follow up. Certified letter will be sent to pt for follow up. Device is at [facility].

## 2013-06-02 NOTE — Progress Notes (Signed)
   The patient was discharged from the internal medicine service from [facility] June 01, 2013. I was called and asked to help arrange followup, as the patient was being discharged on a Sunday. I have reviewed the record. It shows that the patient was a no-show for a heart failure clinic visit. There also appears to be ongoing question concerning who follows her ICD. I am contacting our office today to ask that our team follow through to help clarify the situation if possible.  Jerral Bonito, MD

## 2013-06-18 ENCOUNTER — Encounter (HOSPITAL_COMMUNITY): Payer: Self-pay

## 2013-06-18 ENCOUNTER — Ambulatory Visit (HOSPITAL_COMMUNITY)
Admission: RE | Admit: 2013-06-18 | Discharge: 2013-06-18 | Disposition: A | Discharge status: Home / Self Care | Payer: Medicare Other | Source: Ambulatory Visit | Attending: Internal Medicine | Admitting: Internal Medicine

## 2013-06-18 VITALS — BP 132/78 | HR 61 | Wt 116.8 lb

## 2013-06-18 DIAGNOSIS — I5022 Chronic systolic (congestive) heart failure: Secondary | ICD-10-CM

## 2013-06-18 DIAGNOSIS — I509 Heart failure, unspecified: Secondary | ICD-10-CM | POA: Insufficient documentation

## 2013-06-18 DIAGNOSIS — I4891 Unspecified atrial fibrillation: Secondary | ICD-10-CM

## 2013-06-18 DIAGNOSIS — I5023 Acute on chronic systolic (congestive) heart failure: Secondary | ICD-10-CM

## 2013-06-18 NOTE — Progress Notes (Signed)
Patient ID: Elizabeth Love, female   DOB: [birth year], 57 y.o.   MRN: [id number]   Weight Range 108-110  Baseline proBNP     HPI: Elizabeth Love is 57 year old with a very complicated PMH of chronic systolic heart failure due to NICM (EF 10% 04/2012), status post ICD implantation (placed [facility] 2008 was followed by MD in [place]), hypothyroidism, seizure disorder, HTN, DM2. 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 A fib (on Xarelto), RLE DVT, S/P IVC filter, and protein C deficiency.   Has had multiple admissions for various problems.   Admitted to [facility] 01/06/13 with volume overload in setting of medication noncompliance.  ECHO EF 10%. She was started on IV lasix. While admitted she frequently refused to talk and refused medications and treatments.  Discharge weight 118 pounds.   Admitted 9/13 through 06/01/13 with headache and chest pain. CEs negative. Discharge weight 115 pounds.   She returns for follow up with her son. Today she is cooperative with exam. She is tearful because she is worried about her ICD. [employer] called and said that the she has about 2 months left on her ICD. Denies SOB/PND/Orthopnea. Weight at home 112-116 pounds.  he has a Engineer, civil (consulting) aide 5 hours a day 7 days a week.  She has been out of Xarelto for the past 3 days.     ROS: All systems negative except as listed in HPI, PMH and Problem List.  Past Medical History  Diagnosis Date  . Seizures   . Hypertension   . Stroke     2007 - R sided weakness and expressive aphasia with h/o behavioral problems related to this  . Asthma   . Diabetes mellitus   . 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 Chronic systolic CHF (congestive heart failure)     a. s/p AICD ~2007 ([employer]) - previous care [facility]. No known hx CAD. b. EF 10% by echo 04/2012.  Marland Kitchen Atrial fibrillation     04/2012 - not felt to be an anticoag candidate long term at that time, but later had DVT so was on placed on Xarelto  . Coagulopathy    "Protein C deficiency"  . History of DVT (deep vein thrombosis)     a. 06/2012: RLE DVT, + protein C deficiency, placed on Xarelto. s/p IVC filter  . Noncompliance     Due to mental status, family has had to coax her to take medicines  . Hypothyroidism   . VT (ventricular tachycardia)     a. h/o ICD ~2007-2008. b. Adm 08/2012 with UTI, had VT/ICD shock x 5 (hypokalemic)  . Protein C deficiency     Current Outpatient Prescriptions  Medication Sig Dispense Refill  . acetaminophen (TYLENOL) 325 MG tablet Take 650 mg by mouth every 6 (six) hours as needed for pain.      Marland Kitchen albuterol (PROVENTIL) (2.5 MG/3ML) 0.083% nebulizer solution Take 2.5 mg by nebulization every 6 (six) hours as needed. For cough or wheeze      . ALPRAZolam (XANAX) 0.5 MG tablet Take 0.5 mg by mouth daily as needed for sleep or anxiety. For sleep or anxiety      . amiodarone (PACERONE) 400 MG tablet Take 0.5 tablets (200 mg total) by mouth daily.  15 tablet  3  . digoxin (LANOXIN) 0.125 MG tablet Take 1 tablet (0.125 mg total) by mouth daily.  30 tablet  0  . furosemide (LASIX) 40 MG tablet TAKE  1 TABLET BY MOUTH TWICE DAILY  60 tablet  5  . levETIRAcetam (KEPPRA) 500 MG tablet Take 1,000 mg by mouth 2 (two) times daily.      Marland Kitchen levothyroxine (SYNTHROID, LEVOTHROID) 50 MCG tablet Take 50 mcg by mouth daily.      Marland Kitchen lisinopril (PRINIVIL,ZESTRIL) 10 MG tablet Take 1 tablet (10 mg total) by mouth daily.  30 tablet  3  . metolazone (ZAROXOLYN) 2.5 MG tablet Take 2.5 mg by mouth daily as needed. For fluid retention      . metoprolol succinate (TOPROL-XL) 25 MG 24 hr tablet Take 25 mg by mouth daily.      Marland Kitchen omeprazole (PRILOSEC) 40 MG capsule Take 1 capsule (40 mg total) by mouth 2 (two) times daily.  60 capsule  1  . oxyCODONE-acetaminophen (PERCOCET) 10-325 MG per tablet Take 1 tablet by mouth every 6 (six) hours as needed for pain. Scheduled per pharmacy  30 tablet  0  . polyethylene glycol (MIRALAX / GLYCOLAX) packet Take 17 g  by mouth daily as needed. For constipation      . potassium chloride SA (K-DUR,KLOR-CON) 20 MEQ tablet Take 20 mEq by mouth 2 (two) times daily.      . Rivaroxaban (XARELTO) 20 MG TABS Take 1 tablet (20 mg total) by mouth daily with supper.  30 tablet  6  . spironolactone (ALDACTONE) 25 MG tablet Take 1 tablet (25 mg total) by mouth daily.  30 tablet  3   No current facility-administered medications for this encounter.     PHYSICAL EXAM: Filed Vitals:   06/18/13 1427  BP: 132/78  Pulse: 61  Weight: 116 lb 12.8 oz (52.98 kg)  SpO2: 99%    General:  Interactive. Some expressive aphasia. No resp difficulty Sitting in wheel chair. Son present HEENT: normal Neck: supple. JVP 6-7. Carotids 2+ bilaterally; no bruits. No lymphadenopathy or thryomegaly appreciated. Cor: PMI normal. Regular rate & rhythm. No rubs, gallops or murmurs. Lungs: clear Abdomen: soft, nontender, nondistended. No hepatosplenomegaly. No bruits or masses. Good bowel sounds. Extremities: no cyanosis, clubbing, rash,  Edema R sided hemiparesis Neuro: alert interactive. R sided weakness. + expressive aphasia  1. Chronic Systolic Heart Failure . NYHA II-III difficult to assess to phycological challenges ECHO EF 10% 12/2012. S/P [employer] Jude ICD 2008 place in [place] - Volume status stable. Continue lasix 40 mg twice daily - Continue Toprol XL  25 mg daily Continue digoxin 0.125 mg daily - Continue lisinopril 10 mg daily - Continue spironolactone 25 mg daily -Reinforced daily weights and low salt food choices.  - BMET  06/01/13 ok. Will need BMET at next visit - Refer to EP for new battery  2. A fib Rate controlled . Continue amiodarone 200 mg daily. Continue Xarelto 20 mg daily  3. CVA R sided hemiparesis  4. RLE DVT, S/P IVC filter      Follow up in 2 months  CLEGG,AMY NP-C 4:54 PM

## 2013-06-18 NOTE — Patient Instructions (Addendum)
Follow up in 6-8 weeks.   Do the following things EVERYDAY: 1) Weigh yourself in the morning before breakfast. Write it down and keep it in a log. 2) Take your medicines as prescribed 3) Eat low salt foods-Limit salt (sodium) to 2000 mg per day.  4) Stay as active as you can everyday 5) Limit all fluids for the day to less than 2 liters 

## 2013-06-23 ENCOUNTER — Ambulatory Visit (INDEPENDENT_AMBULATORY_CARE_PROVIDER_SITE_OTHER): Payer: Medicare Other | Admitting: Internal Medicine

## 2013-06-23 ENCOUNTER — Encounter: Payer: Self-pay | Admitting: *Deleted

## 2013-06-23 ENCOUNTER — Encounter: Payer: Self-pay | Admitting: Internal Medicine

## 2013-06-23 VITALS — BP 120/75 | HR 61 | Ht 59.0 in | Wt 116.0 lb

## 2013-06-23 DIAGNOSIS — I472 Ventricular tachycardia: Secondary | ICD-10-CM

## 2013-06-23 DIAGNOSIS — I10 Essential (primary) hypertension: Secondary | ICD-10-CM

## 2013-06-23 DIAGNOSIS — I428 Other cardiomyopathies: Secondary | ICD-10-CM

## 2013-06-23 DIAGNOSIS — I5022 Chronic systolic (congestive) heart failure: Secondary | ICD-10-CM

## 2013-06-23 DIAGNOSIS — I4891 Unspecified atrial fibrillation: Secondary | ICD-10-CM

## 2013-06-23 DIAGNOSIS — Z95810 Presence of automatic (implantable) cardiac defibrillator: Secondary | ICD-10-CM | POA: Insufficient documentation

## 2013-06-23 LAB — ICD DEVICE OBSERVATION
AL AMPLITUDE: 3.3 mv
AL IMPEDENCE ICD: 400 Ohm
ATRIAL PACING ICD: 13 pct
BATTERY VOLTAGE: 2.38 V
CHARGE TIME: 16.3 s
DEV-0020ICD: NEGATIVE
HV IMPEDENCE: 40 Ohm
MODE SWITCH EPISODES: 2
RV LEAD AMPLITUDE: 11.8 mv
RV LEAD IMPEDENCE ICD: 390 Ohm
RV LEAD THRESHOLD: 1 V
VENTRICULAR PACING ICD: 4.2 pct
VF: 0

## 2013-06-23 MED ORDER — AMIODARONE HCL 400 MG PO TABS: ORAL_TABLET | ORAL | Status: DC

## 2013-06-23 NOTE — Assessment & Plan Note (Signed)
No intercurrent atrial fibrillation. We'll decrease her amiodarone from 200 mg daily to 200 mg 5 days per week. We'll check TSH as noted above

## 2013-06-23 NOTE — Progress Notes (Signed)
Patient Care Team: [name], MD as PCP - General (Internal Medicine)   HPI  Elizabeth Love is a 57 y.o. female Seen in followup for a previously implanted ICD undertaken in [place] for which she has been followed in [place] implanted originally for nonischemic heart disease. She has been followed very little heart failure clinic.  Her related medical history is notable for atrial fibrillation for which she takes Rivaroxaban she has a history of DVT status post IVC filter for protein C deficiency, she is a history of a stroke with residual 3 para cysts and expressive aphasia in the context of diabetes and hypertension.  Her device has reached ERI.  She is producing followed in [place] is noted. He is now followed heart failure clinic.  She denies chest pain or shortness of breath. She is immobile and wheelchair she describes pain around her device site  Amiodarone surveillance laboratories demonstrated normal LFTs 9/14 and a borderline elevated TSH 3/14     Past Medical History  Diagnosis Date  . Seizures   . Hypertension   . Stroke     2007 - R sided weakness and expressive aphasia with h/o behavioral problems related to this  . Asthma   . Diabetes mellitus   . 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 Chronic systolic CHF (congestive heart failure)     a. s/p AICD ~2007 ([employer]) - previous care [place]. No known hx CAD. b. EF 10% by echo 04/2012.  . Atrial fibrillation     04/2012 - not felt to be an anticoag candidate long term at that time, but later had DVT so was on placed on Xarelto  . Coagulopathy     "Protein C deficiency"  . History of DVT (deep vein thrombosis)     a. 06/2012: RLE DVT, + protein C deficiency, placed on Xarelto. s/p IVC filter  . Noncompliance     Due to mental status, family has had to coax her to take medicines  . Hypothyroidism   . VT (ventricular tachycardia)     a. h/o ICD ~2007-2008. b. Adm 08/2012 with UTI, had VT/ICD  shock x 5 (hypokalemic)  . Protein C deficiency     Past Surgical History  Procedure Laterality Date  . Cholecystectomy    . Appendectomy    . Pacemaker insertion    . Vena cava filter placement  07/19/2012    Procedure: INSERTION VENA-CAVA FILTER;  Surgeon: Todd F Early, MD;  Location: [facility] OR;  Service: Vascular;  Laterality: [name];  . Insert / replace / remove pacemaker      ICD  . Abdominal hysterectomy      Current Outpatient Prescriptions  Medication Sig Dispense Refill  . acetaminophen (TYLENOL) 325 MG tablet Take 650 mg by mouth every 6 (six) hours as needed for pain.      . albuterol (PROVENTIL) (2.5 MG/3ML) 0.083% nebulizer solution Take 2.5 mg by nebulization every 6 (six) hours as needed. For cough or wheeze      . ALPRAZolam (XANAX) 0.5 MG tablet Take 0.5 mg by mouth daily as needed for sleep or anxiety. For sleep or anxiety      . amiodarone (PACERONE) 400 MG tablet Take 0.5 tablets (200 mg total) by mouth daily.  15 tablet  3  . digoxin (LANOXIN) 0.125 MG tablet Take 1 tablet (0.125 mg total) by mouth daily.  30 tablet  0  . furosemide (LASIX) 40   MG tablet TAKE 1 TABLET BY MOUTH TWICE DAILY  60 tablet  5  . levETIRAcetam (KEPPRA) 500 MG tablet Take 1,000 mg by mouth 2 (two) times daily.      . levothyroxine (SYNTHROID, LEVOTHROID) 50 MCG tablet Take 50 mcg by mouth daily.      . lisinopril (PRINIVIL,ZESTRIL) 10 MG tablet Take 1 tablet (10 mg total) by mouth daily.  30 tablet  3  . metolazone (ZAROXOLYN) 2.5 MG tablet Take 2.5 mg by mouth daily as needed. For fluid retention      . metoprolol succinate (TOPROL-XL) 25 MG 24 hr tablet Take 25 mg by mouth daily.      . omeprazole (PRILOSEC) 40 MG capsule Take 1 capsule (40 mg total) by mouth 2 (two) times daily.  60 capsule  1  . oxyCODONE-acetaminophen (PERCOCET) 10-325 MG per tablet Take 1 tablet by mouth every 6 (six) hours as needed for pain. Scheduled per pharmacy  30 tablet  0  . polyethylene glycol (MIRALAX / GLYCOLAX)  packet Take 17 g by mouth daily as needed. For constipation      . potassium chloride SA (K-DUR,KLOR-CON) 20 MEQ tablet Take 20 mEq by mouth 2 (two) times daily.      . Rivaroxaban (XARELTO) 20 MG TABS Take 1 tablet (20 mg total) by mouth daily with supper.  30 tablet  6  . spironolactone (ALDACTONE) 25 MG tablet Take 1 tablet (25 mg total) by mouth daily.  30 tablet  3   No current facility-administered medications for this visit.    Allergies  Allergen Reactions  . Penicillins Rash  . Sulfa Antibiotics Rash    Review of Systems negative except from HPI and PMH  Physical Exam BP 120/75  Pulse 61  Ht 4' 11" (1.499 m)  Wt 116 lb (52.617 kg)  BMI 23.42 kg/m2 Well developed and cachectic in no acute distress sitting i willn HENT normal E scleral and icterus clear Neck Supple JVP flat; carotids brisk and full Clear to ausculation Device pocket well healed; without hematoma or erythema.  There is no tethering but there is tenderness  Regular rate and rhythm, no murmurs gallops or rub Soft with active bowel sounds No clubbing cyanosis none Edema Alert and oriented, aphasic and hemiparetic  Skin Warm and Dry  ECG demonstrates sinus rhythm at 60 intervals 29/11/42    Assessment and  Plan  

## 2013-06-23 NOTE — Assessment & Plan Note (Signed)
Device has reached ERI We have reviewed the benefits and risks of generator replacement.  These include but are not limited to lead fracture and infection.  The patient understands, agrees and is willing to proceed.  \

## 2013-06-23 NOTE — Assessment & Plan Note (Signed)
Patient is on multiple medications. We will check a digoxin level as last assessment suggested noncompliance

## 2013-06-23 NOTE — Assessment & Plan Note (Signed)
No intercurrent ventricular tachycardia 

## 2013-06-23 NOTE — Patient Instructions (Addendum)
Your physician has recommended you make the following change in your medication:  1) Change how you take Amiodarone - take 200 mg daily for  5 days a week  Your physician has recommended that you have a defibrillator generator change 07/03/13   Your physician recommends that you return for lab work today: TSH/Hepatic Function Panel/BMET/CBCD/INR

## 2013-06-24 ENCOUNTER — Telehealth: Payer: Self-pay | Admitting: Nurse Practitioner

## 2013-06-24 LAB — BASIC METABOLIC PANEL
BUN: 22 mg/dL (ref 6–23)
CO2: 27 mEq/L (ref 19–32)
Calcium: 9.6 mg/dL (ref 8.4–10.5)
Chloride: 101 mEq/L (ref 96–112)
Creatinine, Ser: 1 mg/dL (ref 0.4–1.2)
GFR: 76.66 mL/min (ref >60.00)
Glucose, Bld: 128 mg/dL — ABNORMAL HIGH (ref 70–99)
Potassium: 4.1 mEq/L (ref 3.5–5.1)
Sodium: 138 mEq/L (ref 135–145)

## 2013-06-24 LAB — HEPATIC FUNCTION PANEL
Albumin: 3.8 g/dL (ref 3.5–5.2)
Alkaline Phosphatase: 82 U/L (ref 39–117)
Total Protein: 8.6 g/dL — ABNORMAL HIGH (ref 6.0–8.3)

## 2013-06-24 LAB — CBC WITH DIFFERENTIAL/PLATELET
Basophils Absolute: 0.1 10*3/uL (ref 0.0–0.1)
Basophils Relative: 1 % (ref 0.0–3.0)
Eosinophils Absolute: 0.1 10*3/uL (ref 0.0–0.7)
Eosinophils Relative: 1.5 % (ref 0.0–5.0)
HCT: 35.5 % — ABNORMAL LOW (ref 36.0–46.0)
Hemoglobin: 10.8 g/dL — ABNORMAL LOW (ref 12.0–15.0)
Lymphocytes Relative: 21.3 % (ref 12.0–46.0)
Lymphs Abs: 1.2 10*3/uL (ref 0.7–4.0)
MCHC: 30.3 g/dL (ref 30.0–36.0)
MCV: 80.4 fl (ref 78.0–100.0)
Monocytes Absolute: 0.4 10*3/uL (ref 0.1–1.0)
Monocytes Relative: 6.3 % (ref 3.0–12.0)
Neutro Abs: 4.1 10*3/uL (ref 1.4–7.7)
Neutrophils Relative %: 69.9 % (ref 43.0–77.0)
Platelets: 305 10*3/uL (ref 150.0–400.0)
RBC: 4.41 Mil/uL (ref 3.87–5.11)
RDW: 18.9 % — ABNORMAL HIGH (ref 11.5–14.6)
WBC: 5.9 10*3/uL (ref 4.5–10.5)

## 2013-06-24 LAB — PROTIME-INR
INR: 1.8 ratio — ABNORMAL HIGH (ref 0.8–1.0)
Prothrombin Time: 18.5 s — ABNORMAL HIGH (ref 10.2–12.4)

## 2013-06-24 LAB — DIGOXIN LEVEL: Digoxin Level: 2.7 ng/mL (ref 0.8–2.0)

## 2013-06-24 LAB — TSH: TSH: 1 u[IU]/mL (ref 0.35–5.50)

## 2013-06-24 NOTE — Telephone Encounter (Signed)
I received a call from [facility] labs this AM reporting a critical digoxin level of 2.7.  I called the patient at the only number listed in Epic [phone]) however my call went directly to a voicemail box that is not set up.  As a result, I was unable to leave a message.  I will forward this message to the office so that patient can be contacted later in the day and advised to hold her digoxin until further notice.

## 2013-06-24 NOTE — Telephone Encounter (Signed)
Spoke w/Greg with [facility] he is with pt now, have advised him to tell pt to stop Digoxin for now and will have Dr Odessa Fleming office follow up with pt regarding dose

## 2013-06-24 NOTE — Telephone Encounter (Signed)
Called pt's home # and a lady answered and said there was no Elizabeth Love there I had the wrong #, repeated # back to her to verified, deleted out of chart, called pt's son Elizabeth Love and left VM to call back asap

## 2013-06-30 ENCOUNTER — Encounter (HOSPITAL_COMMUNITY): Payer: Self-pay | Admitting: Pharmacy Technician

## 2013-06-30 NOTE — Telephone Encounter (Signed)
Patient made aware to stay off Digoxin. Agreeable to plan.

## 2013-07-02 MED ORDER — SODIUM CHLORIDE 0.9 % IR SOLN
80.0000 mg | Status: AC
  Filled 2013-07-02 (×2): qty 2

## 2013-07-02 MED ORDER — SODIUM CHLORIDE 0.9 % IV SOLN
INTRAVENOUS | Status: DC
  Administered 2013-07-03: 14:00:00 via INTRAVENOUS

## 2013-07-02 MED ORDER — CHLORHEXIDINE GLUCONATE 4 % EX LIQD: 60.0000 mL | Freq: Once | CUTANEOUS | Status: DC

## 2013-07-02 MED ORDER — VANCOMYCIN HCL IN DEXTROSE 1-5 GM/200ML-% IV SOLN
1000.0000 mg | INTRAVENOUS | Status: AC
  Filled 2013-07-02: qty 200

## 2013-07-03 ENCOUNTER — Ambulatory Visit (HOSPITAL_COMMUNITY)
Admission: RE | Admit: 2013-07-03 | Discharge: 2013-07-03 | Disposition: A | Discharge status: Home / Self Care | Payer: Medicare Other | Source: Ambulatory Visit | Attending: Internal Medicine | Admitting: Internal Medicine

## 2013-07-03 ENCOUNTER — Encounter (HOSPITAL_COMMUNITY)
Admission: RE | Disposition: A | Discharge status: Home / Self Care | Payer: Self-pay | Source: Ambulatory Visit | Attending: Internal Medicine

## 2013-07-03 DIAGNOSIS — I69959 Hemiplegia and hemiparesis following unspecified cerebrovascular disease affecting unspecified side: Secondary | ICD-10-CM | POA: Insufficient documentation

## 2013-07-03 DIAGNOSIS — I428 Other cardiomyopathies: Secondary | ICD-10-CM

## 2013-07-03 DIAGNOSIS — Z79899 Other long term (current) drug therapy: Secondary | ICD-10-CM | POA: Diagnosis not present

## 2013-07-03 DIAGNOSIS — K219 Gastro-esophageal reflux disease without esophagitis: Secondary | ICD-10-CM | POA: Insufficient documentation

## 2013-07-03 DIAGNOSIS — J45909 Unspecified asthma, uncomplicated: Secondary | ICD-10-CM | POA: Insufficient documentation

## 2013-07-03 DIAGNOSIS — Z4502 Encounter for adjustment and management of automatic implantable cardiac defibrillator: Secondary | ICD-10-CM | POA: Diagnosis present

## 2013-07-03 DIAGNOSIS — I5022 Chronic systolic (congestive) heart failure: Secondary | ICD-10-CM

## 2013-07-03 DIAGNOSIS — I10 Essential (primary) hypertension: Secondary | ICD-10-CM | POA: Diagnosis not present

## 2013-07-03 DIAGNOSIS — I4891 Unspecified atrial fibrillation: Secondary | ICD-10-CM | POA: Insufficient documentation

## 2013-07-03 DIAGNOSIS — E119 Type 2 diabetes mellitus without complications: Secondary | ICD-10-CM | POA: Insufficient documentation

## 2013-07-03 DIAGNOSIS — Z95810 Presence of automatic (implantable) cardiac defibrillator: Secondary | ICD-10-CM

## 2013-07-03 DIAGNOSIS — I69920 Aphasia following unspecified cerebrovascular disease: Secondary | ICD-10-CM | POA: Diagnosis not present

## 2013-07-03 DIAGNOSIS — D6859 Other primary thrombophilia: Secondary | ICD-10-CM | POA: Diagnosis not present

## 2013-07-03 DIAGNOSIS — Z86718 Personal history of other venous thrombosis and embolism: Secondary | ICD-10-CM | POA: Diagnosis not present

## 2013-07-03 DIAGNOSIS — E039 Hypothyroidism, unspecified: Secondary | ICD-10-CM | POA: Insufficient documentation

## 2013-07-03 HISTORY — PX: IMPLANTABLE CARDIOVERTER DEFIBRILLATOR GENERATOR CHANGE: SHX5474

## 2013-07-03 LAB — SURGICAL PCR SCREEN
MRSA, PCR: NEGATIVE
Staphylococcus aureus: POSITIVE — AB

## 2013-07-03 LAB — GLUCOSE, CAPILLARY: Glucose-Capillary: 134 mg/dL — ABNORMAL HIGH (ref 70–99)

## 2013-07-03 SURGERY — IMPLANTABLE CARDIOVERTER DEFIBRILLATOR GENERATOR CHANGE
Anesthesia: LOCAL

## 2013-07-03 MED ORDER — ACETAMINOPHEN 325 MG PO TABS: 325.0000 mg | ORAL_TABLET | ORAL | Status: DC | PRN

## 2013-07-03 MED ORDER — MIDAZOLAM HCL 5 MG/5ML IJ SOLN
INTRAMUSCULAR | Status: AC
  Filled 2013-07-03: qty 5

## 2013-07-03 MED ORDER — LIDOCAINE HCL (PF) 1 % IJ SOLN
INTRAMUSCULAR | Status: AC
  Filled 2013-07-03: qty 60

## 2013-07-03 MED ORDER — SODIUM CHLORIDE 0.9 % IV SOLN: INTRAVENOUS | Status: AC

## 2013-07-03 MED ORDER — FENTANYL CITRATE 0.05 MG/ML IJ SOLN
INTRAMUSCULAR | Status: AC
  Filled 2013-07-03: qty 2

## 2013-07-03 MED ORDER — MUPIROCIN 2 % EX OINT
TOPICAL_OINTMENT | Freq: Once | CUTANEOUS | Status: AC
  Administered 2013-07-03: 1 via NASAL
  Filled 2013-07-03: qty 22

## 2013-07-03 MED ORDER — MUPIROCIN 2 % EX OINT
TOPICAL_OINTMENT | CUTANEOUS | Status: AC
  Administered 2013-07-03: 1 via NASAL
  Filled 2013-07-03: qty 22

## 2013-07-03 MED ORDER — ONDANSETRON HCL 4 MG/2ML IJ SOLN: 4.0000 mg | Freq: Four times a day (QID) | INTRAMUSCULAR | Status: DC | PRN

## 2013-07-03 NOTE — CV Procedure (Signed)
Preoperative diagnosis Nonischemic cardiomyopathy/prev ICD Postoperative diagnosis same/   Procedure: Generator replacement     Following informed consent the patient was brought to the electrophysiology laboratory in place of the fluoroscopic table in the supine position after routine prep and drape lidocaine was infiltrated in the region of the previous incision and carried down to later the device pocket using sharp dissection and electrocautery. The pocket was opened the device was freed up and was explanted.  Interrogation of the previously implanted ICD ventricular lead [employer]   demonstrated an R wave of 15  millivolts., and impedance of 360 ohms, and a pacing threshold of 0.8 volts at 0.5 msec.   .    The previously implanted atrial lead [employer]  demonstrated a P-wave amplitude of 3.3 milllivolts  and impedance of  367 ohms, and a pacing threshold of 0.4 volts at @ 0.78milliseconds.  The leads were inspected. Repair was not  needed. The leads were then attached to a [employer] Jude  pulse generator, serial number [id number].    Through the device the P-wave amplitude  Was  2.5 milllivolts and impedance of  360 ohms, and a pacing threshold of 0.5 volts at @ 0.14milliseconds; the ICD ventricular lead demonstrated an R wave of 11.6  millivolts., and impedance of 360 ohms, and a pacing threshold of 1.0 volts at 0.5 msec      High voltage impedances were 39 ohms  The pocket was irrigated with antibiotic containing saline solution hemostasis was assured and the leads and the device were placed in the pocket. The wound was then closed in 3 layers in normal fashion.  The patient tolerated the procedure without apparent complication.  DFT testing was not  performed  [name]   \

## 2013-07-03 NOTE — Progress Notes (Signed)
Dr. Graciela Husbands aware of PT/INR results and does not want lab repeated.

## 2013-07-03 NOTE — Progress Notes (Signed)
Discharge instruction given to family per MD order.  CG able to verbalize understanding.   Pt to car via wheelchair.

## 2013-07-03 NOTE — Interval H&P Note (Signed)
History and Physical Interval Note:  07/03/2013 3:09 PM  Elizabeth Love  has presented today for surgery, with the diagnosis of ERI  The various methods of treatment have been discussed with the patient and family. After consideration of risks, benefits and other options for treatment, the patient has consented to  Procedure(s): IMPLANTABLE CARDIOVERTER DEFIBRILLATOR GENERATOR CHANGE ([name]) as a surgical intervention .  The patient's history has been reviewed, patient examined, no change in status, stable for surgery.  I have reviewed the patient's chart and labs.  Questions were answered to the patient's satisfaction.     [name] ICD Criteria  Current LVEF (within 6 months):15%  NYHA Functional Classification: Class III  Heart Failure History:  Yes, Duration of heart failure since onset is > 9 months  Non-Ischemic Dilated Cardiomyopathy History:  Yes, timeframe is > 9 months  Atrial Fibrillation/Atrial Flutter:  Yes, A-Fib/A-Flutter type: Paroxysmal.  Ventricular Tachycardia History:  No.  Cardiac Arrest History:  No  no  Previous ICD:  Yes, ICD Type:  Dual, Reason for ICD:  Primary prevention.  LVEF is not available  Electrophysiology Study: No.  Anticoagulation Therapy:  Patient is NOT on anticoagulation therapy.   Beta Blocker Therapy:  Yes.   Ace Inhibitor/ARB Therapy:  Yes.

## 2013-07-03 NOTE — H&P (View-Only) (Signed)
Patient Care Team: Edwyna Perfect, MD as PCP - General (Internal Medicine)   HPI  Elizabeth Love is a 57 y.o. female Seen in followup for a previously implanted ICD undertaken in [facility] for which she has been followed in [employer] implanted originally for nonischemic heart disease. She has been followed very little heart failure clinic.  Her related medical history is notable for atrial fibrillation for which she takes Rivaroxaban she has a history of DVT status post IVC filter for protein C deficiency, she is a history of a stroke with residual 3 para cysts and expressive aphasia in the context of diabetes and hypertension.  Her device has reached ERI.  She is producing followed in [facility] is noted. He is now followed heart failure clinic.  She denies chest pain or shortness of breath. She is immobile and wheelchair she describes pain around her device site  Amiodarone surveillance laboratories demonstrated normal LFTs 9/14 and a borderline elevated TSH 3/14     Past Medical History  Diagnosis Date  . Seizures   . Hypertension   . Stroke     2007 - R sided weakness and expressive aphasia with h/o behavioral problems related to this  . Asthma   . Diabetes mellitus   . 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 Chronic systolic CHF (congestive heart failure)     a. s/p AICD ~2007 (St. Jude) - previous care [place]. No known hx CAD. b. EF 10% by echo 04/2012.  Marland Kitchen Atrial fibrillation     04/2012 - not felt to be an anticoag candidate long term at that time, but later had DVT so was on placed on Xarelto  . Coagulopathy     "Protein C deficiency"  . History of DVT (deep vein thrombosis)     a. 06/2012: RLE DVT, + protein C deficiency, placed on Xarelto. s/p IVC filter  . Noncompliance     Due to mental status, family has had to coax her to take medicines  . Hypothyroidism   . VT (ventricular tachycardia)     a. h/o ICD ~2007-2008. b. Adm 08/2012 with UTI, had VT/ICD  shock x 5 (hypokalemic)  . Protein C deficiency     Past Surgical History  Procedure Laterality Date  . Cholecystectomy    . Appendectomy    . Pacemaker insertion    . Vena cava filter placement  07/19/2012    Procedure: INSERTION VENA-CAVA FILTER;  Surgeon: Larina Earthly, MD;  Location: [facility] OR;  Service: Vascular;  Laterality: [name];  . Insert / replace / remove pacemaker      ICD  . Abdominal hysterectomy      Current Outpatient Prescriptions  Medication Sig Dispense Refill  . acetaminophen (TYLENOL) 325 MG tablet Take 650 mg by mouth every 6 (six) hours as needed for pain.      Marland Kitchen albuterol (PROVENTIL) (2.5 MG/3ML) 0.083% nebulizer solution Take 2.5 mg by nebulization every 6 (six) hours as needed. For cough or wheeze      . ALPRAZolam (XANAX) 0.5 MG tablet Take 0.5 mg by mouth daily as needed for sleep or anxiety. For sleep or anxiety      . amiodarone (PACERONE) 400 MG tablet Take 0.5 tablets (200 mg total) by mouth daily.  15 tablet  3  . digoxin (LANOXIN) 0.125 MG tablet Take 1 tablet (0.125 mg total) by mouth daily.  30 tablet  0  . furosemide (LASIX) 40  MG tablet TAKE 1 TABLET BY MOUTH TWICE DAILY  60 tablet  5  . levETIRAcetam (KEPPRA) 500 MG tablet Take 1,000 mg by mouth 2 (two) times daily.      Marland Kitchen levothyroxine (SYNTHROID, LEVOTHROID) 50 MCG tablet Take 50 mcg by mouth daily.      Marland Kitchen lisinopril (PRINIVIL,ZESTRIL) 10 MG tablet Take 1 tablet (10 mg total) by mouth daily.  30 tablet  3  . metolazone (ZAROXOLYN) 2.5 MG tablet Take 2.5 mg by mouth daily as needed. For fluid retention      . metoprolol succinate (TOPROL-XL) 25 MG 24 hr tablet Take 25 mg by mouth daily.      Marland Kitchen omeprazole (PRILOSEC) 40 MG capsule Take 1 capsule (40 mg total) by mouth 2 (two) times daily.  60 capsule  1  . oxyCODONE-acetaminophen (PERCOCET) 10-325 MG per tablet Take 1 tablet by mouth every 6 (six) hours as needed for pain. Scheduled per pharmacy  30 tablet  0  . polyethylene glycol (MIRALAX / GLYCOLAX)  packet Take 17 g by mouth daily as needed. For constipation      . potassium chloride SA (K-DUR,KLOR-CON) 20 MEQ tablet Take 20 mEq by mouth 2 (two) times daily.      . Rivaroxaban (XARELTO) 20 MG TABS Take 1 tablet (20 mg total) by mouth daily with supper.  30 tablet  6  . spironolactone (ALDACTONE) 25 MG tablet Take 1 tablet (25 mg total) by mouth daily.  30 tablet  3   No current facility-administered medications for this visit.    Allergies  Allergen Reactions  . Penicillins Rash  . Sulfa Antibiotics Rash    Review of Systems negative except from HPI and PMH  Physical Exam BP 120/75  Pulse 61  Ht 4\' 11"  (1.499 m)  Wt 116 lb (52.617 kg)  BMI 23.42 kg/m2 Well developed and cachectic in no acute distress sitting i willn HENT normal E scleral and icterus clear Neck Supple JVP flat; carotids brisk and full Clear to ausculation Device pocket well healed; without hematoma or erythema.  There is no tethering but there is tenderness  Regular rate and rhythm, no murmurs gallops or rub Soft with active bowel sounds No clubbing cyanosis none Edema Alert and oriented, aphasic and hemiparetic  Skin Warm and Dry  ECG demonstrates sinus rhythm at 60 intervals 29/11/42    Assessment and  Plan

## 2013-07-06 LAB — WOUND CULTURE: Gram Stain: NONE SEEN

## 2013-07-14 ENCOUNTER — Encounter (INDEPENDENT_AMBULATORY_CARE_PROVIDER_SITE_OTHER): Payer: Self-pay

## 2013-07-14 ENCOUNTER — Ambulatory Visit (INDEPENDENT_AMBULATORY_CARE_PROVIDER_SITE_OTHER): Payer: Medicare Other | Admitting: *Deleted

## 2013-07-14 DIAGNOSIS — I428 Other cardiomyopathies: Secondary | ICD-10-CM

## 2013-07-14 DIAGNOSIS — Z95810 Presence of automatic (implantable) cardiac defibrillator: Secondary | ICD-10-CM

## 2013-07-14 DIAGNOSIS — I5022 Chronic systolic (congestive) heart failure: Secondary | ICD-10-CM

## 2013-07-14 NOTE — Progress Notes (Signed)
Pt seen in device clinic for follow up of recently replaced ICD.  Wound well healed.  No redness, swelling, or edema.  Steri-strips removed today.   Device interrogated and found to be functioning normally.  Changed VF NID from 20 to 24. See PaceArt for full details.  Pt denies chest pain, shortness of breath, palpitations, or dizziness.  [place] 10/14/13 & ROV w/ Dr. Graciela Husbands in 89mo.   Elizabeth Love 07/14/2013 4:24 PM

## 2013-07-15 LAB — ICD DEVICE OBSERVATION
AL AMPLITUDE: 2.1 mv
AL IMPEDENCE ICD: 362.5 Ohm
AL THRESHOLD: 0.5 V
ATRIAL PACING ICD: 19 pct
BAMS-0001: 150 {beats}/min
BAMS-0003: 70 {beats}/min
DEV-0020ICD: NEGATIVE
DEVICE MODEL ICD: 7137541
FVT: 0
HV IMPEDENCE: 35 Ohm
MODE SWITCH EPISODES: 0
PACEART VT: 0
RV LEAD AMPLITUDE: 11.6 mv
RV LEAD THRESHOLD: 1 V
TZAT-0001SLOWVT: 1
TZAT-0004SLOWVT: 8
TZAT-0012SLOWVT: 200 ms
TZAT-0019SLOWVT: 7.5 V
TZON-0004SLOWVT: 30
TZON-0005SLOWVT: 6
TZON-0010SLOWVT: 40 ms
TZST-0001SLOWVT: 3
TZST-0003SLOWVT: 25 J
TZST-0003SLOWVT: 30 J
TZST-0003SLOWVT: 36 J
VENTRICULAR PACING ICD: 0.63 pct
VF: 0

## 2013-07-19 ENCOUNTER — Emergency Department (HOSPITAL_BASED_OUTPATIENT_CLINIC_OR_DEPARTMENT_OTHER)
Admission: EM | Admit: 2013-07-19 | Discharge: 2013-07-20 | Disposition: A | Discharge status: Home / Self Care | Payer: Medicare Other | Source: Home / Self Care | Attending: Emergency Medicine | Admitting: Emergency Medicine

## 2013-07-19 ENCOUNTER — Encounter (HOSPITAL_BASED_OUTPATIENT_CLINIC_OR_DEPARTMENT_OTHER): Payer: Self-pay | Admitting: Emergency Medicine

## 2013-07-19 ENCOUNTER — Emergency Department (HOSPITAL_BASED_OUTPATIENT_CLINIC_OR_DEPARTMENT_OTHER): Payer: Medicare Other

## 2013-07-19 DIAGNOSIS — Z87448 Personal history of other diseases of urinary system: Secondary | ICD-10-CM | POA: Insufficient documentation

## 2013-07-19 DIAGNOSIS — I4729 Other ventricular tachycardia: Secondary | ICD-10-CM | POA: Insufficient documentation

## 2013-07-19 DIAGNOSIS — E876 Hypokalemia: Secondary | ICD-10-CM | POA: Diagnosis present

## 2013-07-19 DIAGNOSIS — Z993 Dependence on wheelchair: Secondary | ICD-10-CM

## 2013-07-19 DIAGNOSIS — F411 Generalized anxiety disorder: Secondary | ICD-10-CM | POA: Diagnosis present

## 2013-07-19 DIAGNOSIS — E039 Hypothyroidism, unspecified: Secondary | ICD-10-CM | POA: Diagnosis present

## 2013-07-19 DIAGNOSIS — I69998 Other sequelae following unspecified cerebrovascular disease: Secondary | ICD-10-CM

## 2013-07-19 DIAGNOSIS — Z91199 Patient's noncompliance with other medical treatment and regimen due to unspecified reason: Secondary | ICD-10-CM | POA: Insufficient documentation

## 2013-07-19 DIAGNOSIS — I959 Hypotension, unspecified: Secondary | ICD-10-CM | POA: Diagnosis present

## 2013-07-19 DIAGNOSIS — G9340 Encephalopathy, unspecified: Secondary | ICD-10-CM | POA: Diagnosis present

## 2013-07-19 DIAGNOSIS — I428 Other cardiomyopathies: Secondary | ICD-10-CM | POA: Diagnosis present

## 2013-07-19 DIAGNOSIS — Z79899 Other long term (current) drug therapy: Secondary | ICD-10-CM

## 2013-07-19 DIAGNOSIS — Z8639 Personal history of other endocrine, nutritional and metabolic disease: Secondary | ICD-10-CM | POA: Insufficient documentation

## 2013-07-19 DIAGNOSIS — J9600 Acute respiratory failure, unspecified whether with hypoxia or hypercapnia: Secondary | ICD-10-CM | POA: Diagnosis present

## 2013-07-19 DIAGNOSIS — Z9119 Patient's noncompliance with other medical treatment and regimen: Secondary | ICD-10-CM

## 2013-07-19 DIAGNOSIS — J45909 Unspecified asthma, uncomplicated: Secondary | ICD-10-CM | POA: Insufficient documentation

## 2013-07-19 DIAGNOSIS — I472 Ventricular tachycardia, unspecified: Secondary | ICD-10-CM | POA: Diagnosis present

## 2013-07-19 DIAGNOSIS — T82198A Other mechanical complication of other cardiac electronic device, initial encounter: Secondary | ICD-10-CM | POA: Insufficient documentation

## 2013-07-19 DIAGNOSIS — I4891 Unspecified atrial fibrillation: Secondary | ICD-10-CM | POA: Insufficient documentation

## 2013-07-19 DIAGNOSIS — I5043 Acute on chronic combined systolic (congestive) and diastolic (congestive) heart failure: Principal | ICD-10-CM | POA: Diagnosis present

## 2013-07-19 DIAGNOSIS — Z86718 Personal history of other venous thrombosis and embolism: Secondary | ICD-10-CM

## 2013-07-19 DIAGNOSIS — Z8673 Personal history of transient ischemic attack (TIA), and cerebral infarction without residual deficits: Secondary | ICD-10-CM | POA: Insufficient documentation

## 2013-07-19 DIAGNOSIS — I509 Heart failure, unspecified: Secondary | ICD-10-CM | POA: Diagnosis present

## 2013-07-19 DIAGNOSIS — Z87891 Personal history of nicotine dependence: Secondary | ICD-10-CM | POA: Insufficient documentation

## 2013-07-19 DIAGNOSIS — IMO0002 Reserved for concepts with insufficient information to code with codable children: Secondary | ICD-10-CM | POA: Diagnosis present

## 2013-07-19 DIAGNOSIS — Z95810 Presence of automatic (implantable) cardiac defibrillator: Secondary | ICD-10-CM

## 2013-07-19 DIAGNOSIS — E119 Type 2 diabetes mellitus without complications: Secondary | ICD-10-CM | POA: Diagnosis present

## 2013-07-19 DIAGNOSIS — G40909 Epilepsy, unspecified, not intractable, without status epilepticus: Secondary | ICD-10-CM | POA: Insufficient documentation

## 2013-07-19 DIAGNOSIS — I10 Essential (primary) hypertension: Secondary | ICD-10-CM | POA: Diagnosis present

## 2013-07-19 DIAGNOSIS — D6859 Other primary thrombophilia: Secondary | ICD-10-CM | POA: Diagnosis present

## 2013-07-19 DIAGNOSIS — I5022 Chronic systolic (congestive) heart failure: Secondary | ICD-10-CM | POA: Insufficient documentation

## 2013-07-19 DIAGNOSIS — Z880 Allergy status to penicillin: Secondary | ICD-10-CM | POA: Insufficient documentation

## 2013-07-19 DIAGNOSIS — G43909 Migraine, unspecified, not intractable, without status migrainosus: Secondary | ICD-10-CM | POA: Diagnosis present

## 2013-07-19 DIAGNOSIS — I69920 Aphasia following unspecified cerebrovascular disease: Secondary | ICD-10-CM

## 2013-07-19 DIAGNOSIS — Y831 Surgical operation with implant of artificial internal device as the cause of abnormal reaction of the patient, or of later complication, without mention of misadventure at the time of the procedure: Secondary | ICD-10-CM | POA: Insufficient documentation

## 2013-07-19 LAB — COMPREHENSIVE METABOLIC PANEL
BUN: 13 mg/dL (ref 6–23)
Calcium: 9.7 mg/dL (ref 8.4–10.5)
Creatinine, Ser: 1 mg/dL (ref 0.50–1.10)
GFR calc Af Amer: 72 mL/min — ABNORMAL LOW (ref >90)
Glucose, Bld: 138 mg/dL — ABNORMAL HIGH (ref 70–99)
Sodium: 143 mEq/L (ref 135–145)
Total Protein: 8.1 g/dL (ref 6.0–8.3)

## 2013-07-19 LAB — CBC WITH DIFFERENTIAL/PLATELET
Eosinophils Absolute: 0.1 10*3/uL (ref 0.0–0.7)
Eosinophils Relative: 2 % (ref 0–5)
Hemoglobin: 9.6 g/dL — ABNORMAL LOW (ref 12.0–15.0)
Lymphocytes Relative: 24 % (ref 12–46)
Lymphs Abs: 1.9 10*3/uL (ref 0.7–4.0)
MCH: 24.6 pg — ABNORMAL LOW (ref 26.0–34.0)
MCHC: 30.6 g/dL (ref 30.0–36.0)
MCV: 80.5 fL (ref 78.0–100.0)
Monocytes Absolute: 0.8 10*3/uL (ref 0.1–1.0)
Monocytes Relative: 11 % (ref 3–12)
Platelets: 259 10*3/uL (ref 150–400)
RBC: 3.9 MIL/uL (ref 3.87–5.11)
WBC: 7.9 10*3/uL (ref 4.0–10.5)

## 2013-07-19 LAB — PROTIME-INR
INR: 4.39 — ABNORMAL HIGH (ref 0.00–1.49)
Prothrombin Time: 40.2 seconds — ABNORMAL HIGH (ref 11.6–15.2)

## 2013-07-19 LAB — TROPONIN I: Troponin I: <0.3 ng/mL (ref <0.30)

## 2013-07-19 MED ORDER — HYDROMORPHONE HCL PF 1 MG/ML IJ SOLN
0.5000 mg | INTRAMUSCULAR | Status: AC
Start: 2013-07-19 — End: 2013-07-19
  Administered 2013-07-19: 0.5 mg via INTRAVENOUS
  Filled 2013-07-19: qty 1

## 2013-07-19 NOTE — ED Notes (Signed)
Pt tp ED via EMS with c/o AICD firing 3-4 times tonight; battery was changed 2 wks ago.

## 2013-07-19 NOTE — ED Provider Notes (Signed)
CSN: [id number]     Arrival date & time 07/19/13  2214 History   First MD Initiated Contact with Patient 07/19/13 2231     Chief Complaint  Patient presents with  . AICD Problem   (Consider location/radiation/quality/duration/timing/severity/associated sxs/prior Treatment) HPI Comments: The patient is a 57 year old female who presents with complaint of AICD discharge which occurred approximately 45 minutes ago while at rest. The patient states that it discharged 4 times. She notes that she has had intermittent chest pain throughout the day. She continues to have chest pain on exam. Her history is greatly limited by what I suspect is expressive aphasia from a previous stroke.  The history is provided by the patient. History limited by: expressive aphasia.    Past Medical History  Diagnosis Date  . Seizures   . Hypertension   . Stroke     2007 - R sided weakness and expressive aphasia with h/o behavioral problems related to this  . Asthma   . Diabetes mellitus   . 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 Chronic systolic CHF (congestive heart failure)     a. s/p AICD ~2007 (St. Jude) - previous care [place]. No known hx CAD. b. EF 10% by echo 04/2012.  Marland Kitchen Atrial fibrillation     04/2012 - not felt to be an anticoag candidate long term at that time, but later had DVT so was on placed on Xarelto  . Coagulopathy     "Protein C deficiency"  . History of DVT (deep vein thrombosis)     a. 06/2012: RLE DVT, + protein C deficiency, placed on Xarelto. s/p IVC filter  . Noncompliance     Due to mental status, family has had to coax her to take medicines  . Hypothyroidism   . VT (ventricular tachycardia)     a. h/o ICD ~2007-2008. b. Adm 08/2012 with UTI, had VT/ICD shock x 5 (hypokalemic)  . Protein C deficiency    Past Surgical History  Procedure Laterality Date  . Cholecystectomy    . Appendectomy    . Pacemaker insertion    . Vena cava filter placement  07/19/2012    Procedure:  INSERTION VENA-CAVA FILTER;  Surgeon: Larina Earthly, MD;  Location: [facility] OR;  Service: Vascular;  Laterality: [name];  . Insert / replace / remove pacemaker      ICD  . Abdominal hysterectomy     Family History  Problem Relation Age of Onset  . Coronary artery disease Neg Hx   . Hypertension Mother    History  Substance Use Topics  . Smoking status: Former Games developer  . Smokeless tobacco: Not on file  . Alcohol Use: No   OB History   Grav Para Term Preterm Abortions TAB SAB Ect Mult Living                 Review of Systems  Constitutional: Negative for fever and fatigue.  HENT: Negative for congestion and drooling.   Eyes: Negative for pain.  Respiratory: Negative for cough and shortness of breath.   Cardiovascular: Negative for chest pain.  Gastrointestinal: Negative for nausea, vomiting, abdominal pain and diarrhea.  Genitourinary: Negative for dysuria and hematuria.  Musculoskeletal: Negative for back pain, gait problem and neck pain.  Skin: Negative for color change.  Neurological: Negative for dizziness and headaches.  Hematological: Negative for adenopathy.  Psychiatric/Behavioral: Negative for behavioral problems.  All other systems reviewed and are negative.    Allergies  Penicillins and Sulfa antibiotics  Home Medications   Current Outpatient Rx  Name  Route  Sig  Dispense  Refill  . acetaminophen (TYLENOL) 325 MG tablet   Oral   Take 650 mg by mouth every 6 (six) hours as needed for pain.         Marland Kitchen albuterol (PROVENTIL) (2.5 MG/3ML) 0.083% nebulizer solution   Nebulization   Take 2.5 mg by nebulization every 6 (six) hours as needed. For cough or wheeze         . ALPRAZolam (XANAX) 0.5 MG tablet   Oral   Take 0.5 mg by mouth daily as needed for sleep or anxiety. For sleep or anxiety         . digoxin (LANOXIN) 0.125 MG tablet   Oral   Take 1 tablet (0.125 mg total) by mouth daily.   30 tablet   0   . furosemide (LASIX) 40 MG tablet      TAKE 1  TABLET BY MOUTH TWICE DAILY   60 tablet   5   . levETIRAcetam (KEPPRA) 500 MG tablet   Oral   Take 1,000 mg by mouth 2 (two) times daily.         Marland Kitchen levothyroxine (SYNTHROID, LEVOTHROID) 50 MCG tablet   Oral   Take 50 mcg by mouth daily.         Marland Kitchen lisinopril (PRINIVIL,ZESTRIL) 10 MG tablet   Oral   Take 1 tablet (10 mg total) by mouth daily.   30 tablet   3   . metolazone (ZAROXOLYN) 2.5 MG tablet   Oral   Take 2.5 mg by mouth daily as needed. For fluid retention         . metoprolol succinate (TOPROL-XL) 25 MG 24 hr tablet   Oral   Take 25 mg by mouth daily.         Marland Kitchen omeprazole (PRILOSEC) 40 MG capsule   Oral   Take 1 capsule (40 mg total) by mouth 2 (two) times daily.   60 capsule   1   . oxyCODONE-acetaminophen (PERCOCET) 10-325 MG per tablet   Oral   Take 1 tablet by mouth every 6 (six) hours as needed for pain. Scheduled per pharmacy   30 tablet   0   . polyethylene glycol (MIRALAX / GLYCOLAX) packet   Oral   Take 17 g by mouth daily as needed. For constipation         . potassium chloride SA (K-DUR,KLOR-CON) 20 MEQ tablet   Oral   Take 20 mEq by mouth 2 (two) times daily.         . Rivaroxaban (XARELTO) 20 MG TABS   Oral   Take 1 tablet (20 mg total) by mouth daily with supper.   30 tablet   6   . spironolactone (ALDACTONE) 25 MG tablet   Oral   Take 1 tablet (25 mg total) by mouth daily.   30 tablet   3    There were no vitals taken for this visit. Physical Exam  Nursing note and vitals reviewed. Constitutional: She appears well-developed and well-nourished.  Restless and hypersensitive to touch.   HENT:  Head: Normocephalic.  Mouth/Throat: Oropharynx is clear and moist. No oropharyngeal exudate.  Eyes: Conjunctivae and EOM are normal. Pupils are equal, round, and reactive to light.  Neck: Normal range of motion. Neck supple.  Cardiovascular: Normal rate, regular rhythm, normal heart sounds and intact distal pulses.  Exam reveals  no gallop  and no friction rub.   No murmur heard. Pulmonary/Chest: Effort normal and breath sounds normal. No respiratory distress. She has no wheezes. She exhibits tenderness (diffuse chest wall ttp).  Dermabond and stitches intact over the left upper chest AICD. The wound appears clean, dry, intact. No evidence of infection. The patient is diffusely tender over the pacemaker site.  Abdominal: Soft. Bowel sounds are normal. There is no tenderness. There is no rebound and no guarding.  Musculoskeletal: Normal range of motion. She exhibits no edema and no tenderness.  Neurological: She is alert.  Skin: Skin is warm and dry.  Psychiatric: She is inattentive.    ED Course  Procedures (including critical care time) Labs Review Labs Reviewed  CBC WITH DIFFERENTIAL - Abnormal; Notable for the following:    Hemoglobin 9.6 (*)    HCT 31.4 (*)    MCH 24.6 (*)    RDW 19.1 (*)    All other components within normal limits  COMPREHENSIVE METABOLIC PANEL - Abnormal; Notable for the following:    Glucose, Bld 138 (*)    Albumin 3.4 (*)    GFR calc non Af Amer 62 (*)    GFR calc Af Amer 72 (*)    All other components within normal limits  PROTIME-INR - Abnormal; Notable for the following:    Prothrombin Time 40.2 (*)    INR 4.39 (*)    All other components within normal limits  TROPONIN I   Imaging Review Dg Chest Port 1 View  07/19/2013   CLINICAL DATA:  Chest pain, history hypertension, asthma, seizures, diabetes, protein C deficiency  EXAM: PORTABLE CHEST - 1 VIEW  COMPARISON:  Portable exam 2150 hr compared to 05/31/2013  FINDINGS: Left subclavian transvenous AICD leads project over right atrium and right ventricle.  Enlargement of cardiac silhouette with pulmonary vascular congestion.  Perihilar infiltrates likely edema and since CHF.  No gross pleural effusion or pneumothorax.  Bones unremarkable.  IMPRESSION: Chronic CHF.   Electronically Signed   By: Ulyses Southward M.D.   On: 07/19/2013 23:22     EKG Interpretation     Ventricular Rate:  82 PR Interval:  204 QRS Duration: 128 QT Interval:  438 QTC Calculation: 511 R Axis:   63 Text Interpretation:  Sinus rhythm with occasional Premature ventricular complexes and Fusion complexes Possible Left atrial enlargement Left bundle branch block            MDM   1. AICD problem    10:34 PM 56 y.o. female presents with complaint her AICD firing 4 times approximately 45 minutes ago while at rest. The patient states that she has had chest pain intermittently throughout the day. She is an incredibly poor historian and has a history of stroke. Unable to contact family for further information d/t phone number provided being disconnected. She is afebrile and vital signs are unremarkable here. Will get labs and chest x-ray. Will have the device interrogated.  Of note pt had normal interrogation of device several days ago.   12:15 AM Transferred care to Dr. Read Drivers w/ d/c pending the findings on interrogation of the device.     Junius Argyle, MD 07/20/13 1100

## 2013-07-20 NOTE — ED Provider Notes (Signed)
AICD was interrogated by techinician. No evidence of any defibrillations since previous interrogation   Hanley Seamen, MD 07/20/13 0121

## 2013-07-20 NOTE — ED Notes (Signed)
See downtime paper charting

## 2013-07-21 ENCOUNTER — Emergency Department (HOSPITAL_COMMUNITY): Payer: Medicare Other

## 2013-07-21 ENCOUNTER — Inpatient Hospital Stay (HOSPITAL_COMMUNITY): Payer: Medicare Other

## 2013-07-21 ENCOUNTER — Encounter: Payer: Self-pay | Admitting: Nurse Practitioner

## 2013-07-21 ENCOUNTER — Encounter (HOSPITAL_COMMUNITY): Payer: Self-pay | Admitting: Student

## 2013-07-21 ENCOUNTER — Inpatient Hospital Stay (HOSPITAL_COMMUNITY)
Admission: EM | Admit: 2013-07-21 | Discharge: 2013-07-25 | DRG: 291 | Disposition: A | Discharge status: Home / Self Care | Payer: Medicare Other | Attending: Internal Medicine | Admitting: Internal Medicine

## 2013-07-21 DIAGNOSIS — I10 Essential (primary) hypertension: Secondary | ICD-10-CM | POA: Insufficient documentation

## 2013-07-21 DIAGNOSIS — I161 Hypertensive emergency: Secondary | ICD-10-CM

## 2013-07-21 DIAGNOSIS — I6930 Unspecified sequelae of cerebral infarction: Secondary | ICD-10-CM | POA: Insufficient documentation

## 2013-07-21 DIAGNOSIS — I5022 Chronic systolic (congestive) heart failure: Secondary | ICD-10-CM | POA: Insufficient documentation

## 2013-07-21 DIAGNOSIS — I428 Other cardiomyopathies: Secondary | ICD-10-CM | POA: Insufficient documentation

## 2013-07-21 DIAGNOSIS — Z86718 Personal history of other venous thrombosis and embolism: Secondary | ICD-10-CM | POA: Insufficient documentation

## 2013-07-21 DIAGNOSIS — I472 Ventricular tachycardia: Secondary | ICD-10-CM | POA: Insufficient documentation

## 2013-07-21 DIAGNOSIS — I4891 Unspecified atrial fibrillation: Secondary | ICD-10-CM | POA: Insufficient documentation

## 2013-07-21 DIAGNOSIS — J9600 Acute respiratory failure, unspecified whether with hypoxia or hypercapnia: Secondary | ICD-10-CM

## 2013-07-21 DIAGNOSIS — I5041 Acute combined systolic (congestive) and diastolic (congestive) heart failure: Secondary | ICD-10-CM

## 2013-07-21 DIAGNOSIS — J45909 Unspecified asthma, uncomplicated: Secondary | ICD-10-CM | POA: Insufficient documentation

## 2013-07-21 DIAGNOSIS — N289 Disorder of kidney and ureter, unspecified: Secondary | ICD-10-CM | POA: Insufficient documentation

## 2013-07-21 LAB — POCT I-STAT 3, ART BLOOD GAS (G3+)
Acid-base deficit: 4 mmol/L — ABNORMAL HIGH (ref 0.0–2.0)
Bicarbonate: 21.9 mEq/L (ref 20.0–24.0)
Patient temperature: 98.6
TCO2: 23 mmol/L (ref 0–100)
pCO2 arterial: 40.8 mmHg (ref 35.0–45.0)

## 2013-07-21 LAB — CBC WITH DIFFERENTIAL/PLATELET
Basophils Relative: 0 % (ref 0–1)
Eosinophils Absolute: 0.2 10*3/uL (ref 0.0–0.7)
Eosinophils Relative: 2 % (ref 0–5)
HCT: 30.2 % — ABNORMAL LOW (ref 36.0–46.0)
Hemoglobin: 9.2 g/dL — ABNORMAL LOW (ref 12.0–15.0)
Hemoglobin: 8.5 g/dL — ABNORMAL LOW (ref 12.0–15.0)
Lymphocytes Relative: 20 % (ref 12–46)
Lymphocytes Relative: 20 % (ref 12–46)
Lymphs Abs: 1.6 10*3/uL (ref 0.7–4.0)
MCH: 25 pg — ABNORMAL LOW (ref 26.0–34.0)
MCHC: 30.5 g/dL (ref 30.0–36.0)
MCV: 82.1 fL (ref 78.0–100.0)
Monocytes Absolute: 1.1 10*3/uL — ABNORMAL HIGH (ref 0.1–1.0)
Monocytes Relative: 10 % (ref 3–12)
Monocytes Relative: 9 % (ref 3–12)
Neutro Abs: 7.4 10*3/uL (ref 1.7–7.7)
Neutro Abs: 5.7 10*3/uL (ref 1.7–7.7)
Neutrophils Relative %: 70 % (ref 43–77)
Platelets: 211 10*3/uL (ref 150–400)
RBC: 3.45 MIL/uL — ABNORMAL LOW (ref 3.87–5.11)
WBC: 8.2 10*3/uL (ref 4.0–10.5)

## 2013-07-21 LAB — POCT I-STAT 3, VENOUS BLOOD GAS (G3P V)
Acid-base deficit: 9 mmol/L — ABNORMAL HIGH (ref 0.0–2.0)
Bicarbonate: 18.4 mEq/L — ABNORMAL LOW (ref 20.0–24.0)
TCO2: 20 mmol/L (ref 0–100)
pCO2, Ven: 44.7 mmHg — ABNORMAL LOW (ref 45.0–50.0)
pH, Ven: 7.221 — ABNORMAL LOW (ref 7.250–7.300)
pO2, Ven: 54 mmHg — ABNORMAL HIGH (ref 30.0–45.0)

## 2013-07-21 LAB — MAGNESIUM: Magnesium: 1.7 mg/dL (ref 1.5–2.5)

## 2013-07-21 LAB — COMPREHENSIVE METABOLIC PANEL
ALT: 16 U/L (ref 0–35)
Albumin: 3.4 g/dL — ABNORMAL LOW (ref 3.5–5.2)
BUN: 16 mg/dL (ref 6–23)
CO2: 19 mEq/L (ref 19–32)
Chloride: 104 mEq/L (ref 96–112)
Creatinine, Ser: 1.15 mg/dL — ABNORMAL HIGH (ref 0.50–1.10)
GFR calc non Af Amer: 53 mL/min — ABNORMAL LOW (ref >90)
Total Bilirubin: 0.9 mg/dL (ref 0.3–1.2)

## 2013-07-21 LAB — URINALYSIS, ROUTINE W REFLEX MICROSCOPIC
Bilirubin Urine: NEGATIVE
Glucose, UA: NEGATIVE mg/dL
Ketones, ur: NEGATIVE mg/dL
Nitrite: NEGATIVE
Protein, ur: 100 mg/dL — AB
Specific Gravity, Urine: 1.026 (ref 1.005–1.030)
Urobilinogen, UA: 1 mg/dL (ref 0.0–1.0)

## 2013-07-21 LAB — TROPONIN I
Troponin I: <0.3 ng/mL (ref <0.30)
Troponin I: <0.3 ng/mL (ref <0.30)

## 2013-07-21 LAB — HEPATIC FUNCTION PANEL
AST: 26 U/L (ref 0–37)
Bilirubin, Direct: 0.4 mg/dL — ABNORMAL HIGH (ref 0.0–0.3)
Indirect Bilirubin: 0.7 mg/dL (ref 0.3–0.9)
Total Bilirubin: 1.1 mg/dL (ref 0.3–1.2)
Total Protein: 7.3 g/dL (ref 6.0–8.3)

## 2013-07-21 LAB — AMYLASE
Amylase: 64 U/L (ref 0–105)
Amylase: 61 U/L (ref 0–105)

## 2013-07-21 LAB — URINE MICROSCOPIC-ADD ON

## 2013-07-21 LAB — GLUCOSE, CAPILLARY: Glucose-Capillary: 144 mg/dL — ABNORMAL HIGH (ref 70–99)

## 2013-07-21 LAB — APTT: aPTT: 33 seconds (ref 24–37)

## 2013-07-21 LAB — LACTIC ACID, PLASMA
Lactic Acid, Venous: 7.1 mmol/L — ABNORMAL HIGH (ref 0.5–2.2)
Lactic Acid, Venous: 1 mmol/L (ref 0.5–2.2)
Lactic Acid, Venous: 1 mmol/L (ref 0.5–2.2)
Lactic Acid, Venous: 0.9 mmol/L (ref 0.5–2.2)

## 2013-07-21 LAB — LIPASE, BLOOD
Lipase: 15 U/L (ref 11–59)
Lipase: 11 U/L (ref 11–59)

## 2013-07-21 LAB — POCT I-STAT TROPONIN I: Troponin i, poc: 0.08 ng/mL (ref 0.00–0.08)

## 2013-07-21 MED ORDER — NITROGLYCERIN IN D5W 200-5 MCG/ML-% IV SOLN
INTRAVENOUS | Status: AC
  Administered 2013-07-21: 20 ug/min via INTRAVENOUS
  Filled 2013-07-21: qty 250

## 2013-07-21 MED ORDER — ALBUTEROL SULFATE (5 MG/ML) 0.5% IN NEBU: 2.5000 mg | INHALATION_SOLUTION | RESPIRATORY_TRACT | Status: DC | PRN

## 2013-07-21 MED ORDER — RIVAROXABAN 20 MG PO TABS
20.0000 mg | ORAL_TABLET | Freq: Every day | ORAL | Status: DC
  Administered 2013-07-21 – 2013-07-24 (×4): 20 mg via ORAL
  Filled 2013-07-21 (×5): qty 1

## 2013-07-21 MED ORDER — DIGOXIN 125 MCG PO TABS
0.1250 mg | ORAL_TABLET | Freq: Every day | ORAL | Status: DC
  Filled 2013-07-21: qty 1

## 2013-07-21 MED ORDER — FUROSEMIDE 10 MG/ML IJ SOLN
40.0000 mg | Freq: Once | INTRAMUSCULAR | Status: AC
  Administered 2013-07-21: 40 mg via INTRAVENOUS

## 2013-07-21 MED ORDER — NITROGLYCERIN 0.4 MG SL SUBL
0.4000 mg | SUBLINGUAL_TABLET | SUBLINGUAL | Status: DC | PRN
  Administered 2013-07-21: 0.4 mg via SUBLINGUAL

## 2013-07-21 MED ORDER — FUROSEMIDE 10 MG/ML IJ SOLN
40.0000 mg | Freq: Two times a day (BID) | INTRAMUSCULAR | Status: DC
Start: 2013-07-21 — End: 2013-07-21

## 2013-07-21 MED ORDER — INSULIN ASPART 100 UNIT/ML ~~LOC~~ SOLN
0.0000 [IU] | SUBCUTANEOUS | Status: DC
  Administered 2013-07-21: 2 [IU] via SUBCUTANEOUS

## 2013-07-21 MED ORDER — LEVOTHYROXINE SODIUM 50 MCG PO TABS
50.0000 ug | ORAL_TABLET | Freq: Every day | ORAL | Status: DC
  Administered 2013-07-22 – 2013-07-24 (×3): 50 ug via ORAL
  Filled 2013-07-21 (×4): qty 1

## 2013-07-21 MED ORDER — LISINOPRIL 10 MG PO TABS
10.0000 mg | ORAL_TABLET | Freq: Every day | ORAL | Status: DC
  Administered 2013-07-21 – 2013-07-23 (×3): 10 mg via ORAL
  Filled 2013-07-21 (×4): qty 1

## 2013-07-21 MED ORDER — ALBUTEROL SULFATE (5 MG/ML) 0.5% IN NEBU
2.5000 mg | INHALATION_SOLUTION | Freq: Once | RESPIRATORY_TRACT | Status: AC
  Administered 2013-07-21: 2.5 mg via RESPIRATORY_TRACT
  Filled 2013-07-21: qty 0.5

## 2013-07-21 MED ORDER — NITROGLYCERIN IN D5W 200-5 MCG/ML-% IV SOLN: 2.0000 ug/min | Freq: Once | INTRAVENOUS | Status: DC

## 2013-07-21 MED ORDER — OXYCODONE-ACETAMINOPHEN 5-325 MG PO TABS
1.0000 | ORAL_TABLET | Freq: Four times a day (QID) | ORAL | Status: DC | PRN
  Administered 2013-07-22 – 2013-07-23 (×3): 1 via ORAL
  Filled 2013-07-21 (×7): qty 1

## 2013-07-21 MED ORDER — MAGNESIUM SULFATE 40 MG/ML IJ SOLN
2.0000 g | Freq: Once | INTRAMUSCULAR | Status: AC
  Administered 2013-07-21: 2 g via INTRAVENOUS
  Filled 2013-07-21: qty 50

## 2013-07-21 MED ORDER — METOPROLOL TARTRATE 25 MG PO TABS
25.0000 mg | ORAL_TABLET | Freq: Two times a day (BID) | ORAL | Status: DC
  Administered 2013-07-21 – 2013-07-25 (×5): 25 mg via ORAL
  Filled 2013-07-21 (×10): qty 1

## 2013-07-21 MED ORDER — IPRATROPIUM BROMIDE 0.02 % IN SOLN
0.5000 mg | Freq: Once | RESPIRATORY_TRACT | Status: AC
  Administered 2013-07-21: 0.5 mg via RESPIRATORY_TRACT
  Filled 2013-07-21: qty 2.5

## 2013-07-21 MED ORDER — FUROSEMIDE 10 MG/ML IJ SOLN
40.0000 mg | Freq: Two times a day (BID) | INTRAMUSCULAR | Status: DC
  Administered 2013-07-21 – 2013-07-22 (×2): 40 mg via INTRAVENOUS
  Filled 2013-07-21 (×4): qty 4

## 2013-07-21 MED ORDER — AMIODARONE HCL 200 MG PO TABS
200.0000 mg | ORAL_TABLET | Freq: Every day | ORAL | Status: DC
  Administered 2013-07-21 – 2013-07-25 (×4): 200 mg via ORAL
  Filled 2013-07-21 (×5): qty 1

## 2013-07-21 MED ORDER — FUROSEMIDE 10 MG/ML IJ SOLN: 20.0000 mg | Freq: Once | INTRAMUSCULAR | Status: DC

## 2013-07-21 MED ORDER — LEVETIRACETAM 500 MG PO TABS
1000.0000 mg | ORAL_TABLET | Freq: Two times a day (BID) | ORAL | Status: DC
  Filled 2013-07-21 (×2): qty 2

## 2013-07-21 MED ORDER — FENTANYL CITRATE 0.05 MG/ML IJ SOLN
50.0000 ug | Freq: Once | INTRAMUSCULAR | Status: AC
  Administered 2013-07-21: 50 ug via INTRAVENOUS

## 2013-07-21 MED ORDER — OXYCODONE HCL 5 MG PO TABS
5.0000 mg | ORAL_TABLET | Freq: Four times a day (QID) | ORAL | Status: DC | PRN
  Administered 2013-07-21 – 2013-07-25 (×7): 5 mg via ORAL
  Filled 2013-07-21 (×11): qty 1

## 2013-07-21 MED ORDER — SODIUM CHLORIDE 0.9 % IV SOLN
INTRAVENOUS | Status: DC
  Administered 2013-07-21 – 2013-07-24 (×2): via INTRAVENOUS

## 2013-07-21 MED ORDER — LORAZEPAM 2 MG/ML IJ SOLN: 0.5000 mg | Freq: Once | INTRAMUSCULAR | Status: DC

## 2013-07-21 MED ORDER — SPIRONOLACTONE 25 MG PO TABS
25.0000 mg | ORAL_TABLET | Freq: Every day | ORAL | Status: DC
  Administered 2013-07-21 – 2013-07-25 (×4): 25 mg via ORAL
  Filled 2013-07-21 (×5): qty 1

## 2013-07-21 MED ORDER — SODIUM CHLORIDE 0.9 % IV SOLN
1000.0000 mg | Freq: Two times a day (BID) | INTRAVENOUS | Status: DC
  Administered 2013-07-21 – 2013-07-22 (×2): 1000 mg via INTRAVENOUS
  Filled 2013-07-21 (×3): qty 10

## 2013-07-21 MED ORDER — NITROGLYCERIN IN D5W 200-5 MCG/ML-% IV SOLN
2.0000 ug/min | Freq: Once | INTRAVENOUS | Status: AC
  Administered 2013-07-21: 20 ug/min via INTRAVENOUS

## 2013-07-21 MED ORDER — FENTANYL CITRATE 0.05 MG/ML IJ SOLN
INTRAMUSCULAR | Status: AC
  Administered 2013-07-21: 50 ug via INTRAVENOUS
  Filled 2013-07-21: qty 2

## 2013-07-21 MED ORDER — PANTOPRAZOLE SODIUM 40 MG PO TBEC
40.0000 mg | DELAYED_RELEASE_TABLET | Freq: Every day | ORAL | Status: DC
  Administered 2013-07-21 – 2013-07-22 (×2): 40 mg via ORAL
  Filled 2013-07-21 (×2): qty 1

## 2013-07-21 MED ORDER — NITROGLYCERIN 0.4 MG SL SUBL
SUBLINGUAL_TABLET | SUBLINGUAL | Status: AC
  Administered 2013-07-21: 0.4 mg via SUBLINGUAL
  Filled 2013-07-21: qty 25

## 2013-07-21 MED ORDER — OXYCODONE-ACETAMINOPHEN 10-325 MG PO TABS: 1.0000 | ORAL_TABLET | Freq: Four times a day (QID) | ORAL | Status: DC | PRN

## 2013-07-21 MED ORDER — FUROSEMIDE 10 MG/ML IJ SOLN
INTRAMUSCULAR | Status: AC
  Administered 2013-07-21: 40 mg via INTRAVENOUS
  Filled 2013-07-21: qty 4

## 2013-07-21 NOTE — ED Provider Notes (Addendum)
CSN: [id number]     Arrival date & time 07/21/13  0751 History   First MD Initiated Contact with Patient 07/21/13 [phone]     Chief Complaint  Patient presents with  . Abdominal Pain  . Headache  . Chest Pain   (Consider location/radiation/quality/duration/timing/severity/associated sxs/prior Treatment) HPI Comments: 57 yo AA female presenting to ER via EMS from home for L sided CP, ABD pain, HA.    History limited by pt's inability provide history.  She is anxious and moaning, but alert and oriented.  She states she was seen on Friday 07/18/13 at [facility], but no records present.   Per EMS: PMH: DM, Stroke with R sided weakness (2007), PM/ICD, HTN Allergies: Sulfa and PCN Med: lisinopril, Lasix, Synthroid, Omeprazole, Spironolactone, Lasix,    Pt's family called EMS and she was transported.   Patient is a 57 y.o. female presenting with abdominal pain, headaches, and chest pain. The history is provided by the patient and the EMS personnel.  Abdominal Pain Pain location:  Generalized Pain quality: aching   Pain radiates to:  Does not radiate Pain severity:  Mild Onset quality:  Gradual Duration:  1 day Timing:  Constant Chronicity:  Recurrent Relieved by:  None tried Worsened by:  Nothing tried Associated symptoms: chest pain   Associated symptoms: no anorexia, no belching, no chills, no constipation, no cough and no diarrhea   Risk factors: no alcohol abuse, no aspirin use, not elderly, has not had multiple surgeries, no NSAID use, not obese, not pregnant and no recent hospitalization   Headache Associated symptoms: abdominal pain   Associated symptoms: no cough and no diarrhea   Chest Pain Associated symptoms: abdominal pain and headache   Associated symptoms: no anorexia and no cough     No past medical history on file. No past surgical history on file. No family history on file. History  Substance Use Topics  . Smoking status: Not on file  . Smokeless tobacco: Not on file   . Alcohol Use: Not on file   OB History   No data available     Review of Systems  Constitutional: Negative for chills.  Respiratory: Negative for cough.   Cardiovascular: Positive for chest pain.  Gastrointestinal: Positive for abdominal pain. Negative for diarrhea, constipation and anorexia.  Neurological: Positive for headaches.    Allergies  Review of patient's allergies indicates not on file.  Home Medications  No current outpatient prescriptions on file. SpO2 100% Physical Exam  Vitals reviewed. Constitutional: She is oriented to person, place, and time. She appears well-developed and well-nourished. She is not intubated.  Pt is tearful and upset.  She is moaning, but easily distractible.    HENT:  Head: Normocephalic and atraumatic.  Eyes: Conjunctivae are normal.  Neck: JVD present.  Cardiovascular: Normal rate and regular rhythm.   Murmur heard. Pulmonary/Chest: Accessory muscle usage present. Tachypnea noted. No apnea and not bradypneic. She is not intubated. She is in respiratory distress. She has wheezes.  Abdominal: Soft. She exhibits no mass. There is tenderness. There is no rebound and no guarding.  Musculoskeletal: She exhibits edema.  Neurological: She is alert and oriented to person, place, and time.    ED Course  Procedures (including critical care time)  Angiocath insertion Performed by: Redgie Grayer, DAVID  Consent: Verbal consent obtained. Risks and benefits: risks, benefits and alternatives were discussed Time out: Immediately prior to procedure a "time out" was called to verify the correct patient, procedure, equipment, support staff and site/side  marked as required.  Preparation: Patient was prepped and draped in the usual sterile fashion.  Vein Location: R EJ  Ultrasound Guided - no  Gauge: 20  Normal blood return and flush without difficulty Patient tolerance: Patient tolerated the procedure well with no immediate  complications.  CRITICAL CARE Performed by: Redgie Grayer, DAVID   Total critical care time: 45  Critical care time was exclusive of separately billable procedures and treating other patients.  Critical care was necessary to treat or prevent imminent or life-threatening deterioration.  Critical care was time spent personally by me on the following activities: development of treatment plan with patient and/or surrogate as well as nursing, discussions with consultants, evaluation of patient's response to treatment, examination of patient, obtaining history from patient or surrogate, ordering and performing treatments and interventions, ordering and review of laboratory studies, ordering and review of radiographic studies, pulse oximetry and re-evaluation of patient's condition.     Date: 07/21/2013 @ 0757  Rate: 94  Rhythm: paced  QRS Axis: normal  Intervals: PR prolonged and QT prolonged  ST/T Wave abnormalities: nonspecific ST changes  Conduction Disutrbances:first-degree A-V block  and left bundle branch block  Narrative Interpretation:   Old EKG Reviewed: none available at time of interpretation    Results for orders placed during the hospital encounter of 07/21/13  CBC WITH DIFFERENTIAL      Result Value Range   WBC 11.0 (*) 4.0 - 10.5 K/uL   RBC 3.68 (*) 3.87 - 5.11 MIL/uL   Hemoglobin 9.2 (*) 12.0 - 15.0 g/dL   HCT 16.1 (*) 09.6 - 04.5 %   MCV 82.1  78.0 - 100.0 fL   MCH 25.0 (*) 26.0 - 34.0 pg   MCHC 30.5  30.0 - 36.0 g/dL   RDW 40.9 (*) 81.1 - 91.4 %   Platelets 253  150 - 400 K/uL   Neutrophils Relative % 68  43 - 77 %   Neutro Abs 7.4  1.7 - 7.7 K/uL   Lymphocytes Relative 20  12 - 46 %   Lymphs Abs 2.2  0.7 - 4.0 K/uL   Monocytes Relative 10  3 - 12 %   Monocytes Absolute 1.1 (*) 0.1 - 1.0 K/uL   Eosinophils Relative 2  0 - 5 %   Eosinophils Absolute 0.2  0.0 - 0.7 K/uL   Basophils Relative 0  0 - 1 %   Basophils Absolute 0.0  0.0 - 0.1 K/uL  COMPREHENSIVE  METABOLIC PANEL      Result Value Range   Sodium 140  135 - 145 mEq/L   Potassium 4.1  3.5 - 5.1 mEq/L   Chloride 104  96 - 112 mEq/L   CO2 19  19 - 32 mEq/L   Glucose, Bld 221 (*) 70 - 99 mg/dL   BUN 16  6 - 23 mg/dL   Creatinine, Ser 7.82 (*) 0.50 - 1.10 mg/dL   Calcium 9.5  8.4 - 95.6 mg/dL   Total Protein 8.3  6.0 - 8.3 g/dL   Albumin 3.4 (*) 3.5 - 5.2 g/dL   AST 29  0 - 37 U/L   ALT 16  0 - 35 U/L   Alkaline Phosphatase 80  39 - 117 U/L   Total Bilirubin 0.9  0.3 - 1.2 mg/dL   GFR calc non Af Amer 53 (*) >90 mL/min   GFR calc Af Amer 61 (*) >90 mL/min  LIPASE, BLOOD      Result Value Range   Lipase  15  11 - 59 U/L  LACTIC ACID, PLASMA      Result Value Range   Lactic Acid, Venous 7.1 (*) 0.5 - 2.2 mmol/L  URINALYSIS, ROUTINE W REFLEX MICROSCOPIC      Result Value Range   Color, Urine YELLOW  YELLOW   APPearance CLOUDY (*) CLEAR   Specific Gravity, Urine 1.026  1.005 - 1.030   pH 5.5  5.0 - 8.0   Glucose, UA NEGATIVE  NEGATIVE mg/dL   Hgb urine dipstick NEGATIVE  NEGATIVE   Bilirubin Urine NEGATIVE  NEGATIVE   Ketones, ur NEGATIVE  NEGATIVE mg/dL   Protein, ur 161 (*) NEGATIVE mg/dL   Urobilinogen, UA 1.0  0.0 - 1.0 mg/dL   Nitrite NEGATIVE  NEGATIVE   Leukocytes, UA NEGATIVE  NEGATIVE  PREGNANCY, URINE      Result Value Range   Preg Test, Ur NEGATIVE  NEGATIVE  PRO B NATRIURETIC PEPTIDE      Result Value Range   Pro B Natriuretic peptide (BNP) 9726.0 (*) 0 - 125 pg/mL  GLUCOSE, CAPILLARY      Result Value Range   Glucose-Capillary 179 (*) 70 - 99 mg/dL  TROPONIN I      Result Value Range   Troponin I <0.30  <0.30 ng/mL  URINE MICROSCOPIC-ADD ON      Result Value Range   Squamous Epithelial / LPF RARE  RARE   WBC, UA 0-2  <3 WBC/hpf   Bacteria, UA RARE  RARE   Urine-Other AMORPHOUS URATES/PHOSPHATES    POCT I-STAT TROPONIN I      Result Value Range   Troponin i, poc 0.08  0.00 - 0.08 ng/mL   Comment 3           POCT I-STAT 3, BLOOD GAS (G3P V)       Result Value Range   pH, Ven 7.221 (*) 7.250 - 7.300   pCO2, Ven 44.7 (*) 45.0 - 50.0 mmHg   pO2, Ven 54.0 (*) 30.0 - 45.0 mmHg   Bicarbonate 18.4 (*) 20.0 - 24.0 mEq/L   TCO2 20  0 - 100 mmol/L   O2 Saturation 81.0     Acid-base deficit 9.0 (*) 0.0 - 2.0 mmol/L   Sample type VENOUS    POCT I-STAT 3, BLOOD GAS (G3+)      Result Value Range   pH, Arterial 7.339 (*) 7.350 - 7.450   pCO2 arterial 40.8  35.0 - 45.0 mmHg   pO2, Arterial 225.0 (*) 80.0 - 100.0 mmHg   Bicarbonate 21.9  20.0 - 24.0 mEq/L   TCO2 23  0 - 100 mmol/L   O2 Saturation 100.0     Acid-base deficit 4.0 (*) 0.0 - 2.0 mmol/L   Patient temperature 98.6 F     Collection site RADIAL, ALLEN'S TEST ACCEPTABLE     Drawn by RT     Sample type ARTERIAL      MDM  No diagnosis found. 57 yo AA female presents with CP/ABD pain/HA.  Pt with PMH sign for HTN, Stroke R sided, CHF, DM.  Pt had recent PM and ICD placed in mid OCT.  Pt seen on 1NOV14 in ER for CP had PM interrogated and d/c home.    9:11 AM Acute decompensation; diaphoretic, more lethargic, increased wob, unable to obtain IV access, R IJ placed by me STAT CXR with cardiomegaly and pulmonary edema; BP elevated; pt given SL NTG and nitro gtt started. RT to initiate BiPap.  STAT page to  intensivist who will report to ER to assess.    1308: Consulted Cardiology based on pt's cardiac history.   9:59 AM ICU team at bedside; VSS; plan for CVL access and admission to ICU.  (CVL access performed by [facility] staff.)  Pt t/s to ICU in stable, but critical condition.    1.  Respiratory Distress 2. Lactic Acidosis 3. Altered Mental Status 4. Chest Pain 5. Headache 6. Abdominal Pain 7. Congestive Heart Failure, acute 8. Hypocoagulable state 9. Diabetes Mellitis  Darlys Gales, MD 07/21/13 Malvin Johns  Darlys Gales, MD 09/25/13 [phone]

## 2013-07-21 NOTE — Consult Note (Signed)
CARDIOLOGY CONSULT NOTE   Patient ID: Elizabeth Love MRN: [id number], DOB/AGE: 55/06/1956   Admit date: 07/21/2013 Date of Consult: 07/21/2013   Primary Physician: [name], [name], MD Primary Cardiologist: Odessa Fleming, MD   Pt. Profile  57 y/o female with h/o NICM s/p ICD who presented to the [facility] ED with progressive dyspnea this AM.  Problem List  Past Medical History:  Diagnosis  Date   .  Seizures    .  Hypertension    .  Stroke      2007 - R sided weakness and expressive aphasia with h/o behavioral problems related to this   .  Asthma    .  Diabetes mellitus    .  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  Chronic systolic CHF (congestive heart failure)      a. s/p AICD ~2007 ([employer]) - previous care [facility]. No known hx CAD. b. EF 10% by echo 04/2012.   Marland Kitchen  Atrial fibrillation      04/2012 - not felt to be an anticoag candidate long term at that time, but later had DVT so was on placed on Xarelto   .  Coagulopathy      "Protein C deficiency"   .  History of DVT (deep vein thrombosis)      a. 06/2012: RLE DVT, + protein C deficiency, placed on Xarelto. s/p IVC filter   .  Noncompliance      Due to mental status, family has had to coax her to take medicines   .  Hypothyroidism    .  VT (ventricular tachycardia)      a. h/o ICD ~2007-2008. b. Adm 08/2012 with UTI, had VT/ICD shock x 5 (hypokalemic)   .  Protein C deficiency     Past Surgical History   Procedure  Laterality  Date   .  Cholecystectomy     .  Appendectomy     .  Pacemaker insertion     .  Vena cava filter placement   07/19/2012     Procedure: INSERTION VENA-CAVA FILTER; Surgeon: Larina Earthly, MD; Location: [facility] OR; Service: Vascular; Laterality: [name];   .  Insert / replace / remove pacemaker       ICD   .  Abdominal hysterectomy      Allergies: PCN, Sulfa Drugs  HPI  Patient is a 57 year old with a PMH positive for CVA (right-sided paralysis), chronic systolic/diastolic CHF (EF 40%  04/11/13), HTN, atrial fibrillation, hx of V-Tach, hx of DVT, anxiety, asthma, and nonischemic cardiomyopathy who presented to the ED with chest pain, abdominal pain, and headache. She was seen at the medical center in [facility] on Saturday d/t concerns that her AICD fired, but assessment of the device indicated it had not been activated.   The patient is a poor historian. She reports that her chest pain began yesterday, is located directly over her device, and has felt like a constant "pressure." She denied taking anything for pain, and rates it as a 10/10. She is not sure if it radiated or not. She endorses, fatigue, weakness, fever, chills, heart palpitations, DOE, SOB, and nausea/vomiting. She also states that she felt her device fire "2-3-4 times." Her family (mother and brother) state that she was SOB last night, and did not sleep. It got progressive worse after 0400, and she was subsequently brought into the ED. Her family reports that she ate [facility] for  dinner last night, which is not a common occurrence.  She states that she has been recently ill with "flu-like" symptoms, but was unable to provide any more details. She denies any tobacco-use, alcohol-use, or illicit drug use. She also endorses a persistent headache, as well as abdominal pain which has since resolved since arrival in the ED. Inpatient Medications  . amiodarone  200 mg Oral Daily  . digoxin  0.125 mg Oral Daily  . furosemide  40 mg Intravenous Q12H  . insulin aspart  0-15 Units Subcutaneous Q4H  . levETIRAcetam  1,000 mg Oral BID  . [START ON 07/22/2013] levothyroxine  50 mcg Oral QAC breakfast  . lisinopril  10 mg Oral Daily  . metoprolol tartrate  25 mg Oral BID  . pantoprazole  40 mg Oral Daily  . spironolactone  25 mg Oral Daily    Family History Family History  Problem Relation Age of Onset  . Hypertension Mother   . Coronary artery disease Neg Hx      Social History History   Social History  . Marital Status:  Single    Spouse Name: [name]    Number of Children: [name]  . Years of Education: [name]   Occupational History  . Not on file.   Social History Main Topics  . Smoking status: Not on file  . Smokeless tobacco: Not on file  . Alcohol Use: Not on file  . Drug Use: Not on file  . Sexual Activity: Not on file   Other Topics Concern  . Not on file   Social History Narrative  . No narrative on file     Review of Systems: (Patient is a poor historian - family has provided some details)  General: Positive for fevers/chills. Family feels that weight has been stable. Cardiovascular:  Positive for chest pain, DOE, and heart palpitations. Negative for orthopnea, PND. Dermatological: No rash, lesions/masses Respiratory: Positive for SOB, DOE, Urologic: No hematuria, dysuria Abdominal:   Positive for nausea/vomiting Neurologic:  Family feels she is more distressed/anxious than baseline All other systems reviewed and are otherwise negative except as noted above.  Physical Exam  Blood pressure 122/74, pulse 72, temperature 98.4 F (36.9 C), temperature source Oral, resp. rate 23, SpO2 100.00%.  General: Ill-appearing female on bipap, appears to be in some distress Psych: Emotionally labile Neuro: Difficult to assess d/t patient-reported 10/10 pain, tearfulness, and difficulty follow commands HEENT: Bipap in place Neck: Difficult to assess d/t bipap - no obviously elevated JVP. Lungs:  Decreased breath sounds w/ crackles @ bilat bases. Heart: RRR, S3, no murmurs. Abdomen: Soft, non-distended, tender and guarded, diminished BS x 4.  Extremities: No clubbing, cyanosis or edema. DP/PT/Radials 2+ and equal bilaterally.  Labs   Recent Labs  07/21/13 0902  TROPONINI <0.30   Lab Results  Component Value Date   WBC 11.0* 07/21/2013   HGB 9.2* 07/21/2013   HCT 30.2* 07/21/2013   MCV 82.1 07/21/2013   PLT 253 07/21/2013     Recent Labs Lab 07/21/13 0902  NA 140  K 4.1  CL 104  CO2 19    BUN 16  CREATININE 1.15*  CALCIUM 9.5  PROT 8.3  BILITOT 0.9  ALKPHOS 80  ALT 16  AST 29  GLUCOSE 221*   Radiology/Studies  Dg Chest Portable 1 View  07/21/2013   CLINICAL DATA:  Shortness of breath. Respiratory distress.  EXAM: PORTABLE CHEST - 1 VIEW  COMPARISON:  None.  FINDINGS: Patient has left-sided AICD. Leads  overlie the right atrium and right ventricle.  The heart is enlarged. There are patchy densities throughout the lungs bilaterally, consistent with interstitial infiltrates/edema. Work of opacity at the left lung base may indicate atelectasis or infiltrate.  IMPRESSION: 1. Cardiomegaly and interstitial infiltrate/edema. 2. Left lower lobe infiltrate or atelectasis.   Electronically Signed   By: Rosalie Gums M.D.   On: 07/21/2013 09:06   ECG  Rsr, 94, lbbb, lae, pvc's.  ASSESSMENT AND PLAN  1. Acute on chronic combined (systolic/diastolic) CHF/NICM: -In setting of hypertensive emergency. -LVEF ~15% on 04/11/13.  Evidence of volume overload on exam. -Agree with diuresis and BP mgmt. -Cont home doses of digoxin, acei, bb, spiro.  2.  Hypertensive Emergency:   -In setting of #1.  BP currently improved on IV NTG. -Plan to wean and resume home meds. -Diurese.  Dry weight generally ~ 116 lbs per family.  3.  Chest Pain:   - Continue serial troponins - Consider d-dimer d/t patient's hx, being wheelchair-bound, and continued SOB - Continue diuresis for volume overload - Continue on tele for hx of a-fib  4. Acute respiratory failure in setting of pulmonary edema: (see #1) -CXR on 11/03 indicated cardiomegaly and interstitial infiltrate/edema. -Lactic acid of 7.1 - Management per CCM  5. Altered mental status: Acute encephalopathy w/ hx of seizures, and anxiety disorder. Patient has been complaining of severe HA - Drug screen per CCM - ? Head CT for possible ICP - Holding Xanax/Percocet per CCM  6.  PAF:   -In sinus.  Cont amio/xarelto.  7.  VT: - s/p ICD -recent  interrogation w/o evidence of VT/shocks.  SignedRochele Raring PA student 07/21/2013, 11:37 AM  Pt seen and examined with Ms. Celedonio Miyamoto. Family interviewed.  Above note addended to reflect my thoughts.  Briefly 57 y/o female with h/o NICM.  She was seen @ [facility] on Saturday due to concern over ICD shock.  Interrogation showed no such shock.  Was in [facility] yesterday.  Ate [employer] for dinner.  Last night became restless and dsypneic, which progressed this AM.  Hypertensive in ED with evidence of volume overload on exam.  Placed on bipap and admitted by CCM.  Cardiology asked to eval.  BP improved.  Crackles and S3 on exam.  Rec continued diuresis and BP mgmt.  Resume home bb/acei/spiro.  Wean bipap as oxygenation improves.  No plan at this time for further ischemic eval.  Cont xarelto for h/o paf/dvt/protein C def.  Nicolasa Ducking, NP 12:44 PM 07/21/2013  I have personally seen and examined this patient with Ward Givens, NP. I agree with the assessment and plan as outlined above. Pt with known non-ischemic cardiomyopathy and chronic systolic CHF admitted with volume overload, respiratory distress. Agree that we should start with diuresis with IV Lasix. She is currently wearing Bipap and stable. Aggressive BP control. We will follow with you.   MCALHANY,CHRISTOPHER 2:48 PM 07/21/2013

## 2013-07-21 NOTE — Procedures (Signed)
Central Venous Catheter Insertion Procedure Note Elizabeth Love [id number] 1956-06-17  Procedure: Insertion of Central Venous Catheter Indications: Drug and/or fluid administration  Procedure Details Consent: Risks of procedure as well as the alternatives and risks of each were explained to the (patient/caregiver).  Consent for procedure obtained. Time Out: Verified patient identification, verified procedure, site/side was marked, verified correct patient position, special equipment/implants available, medications/allergies/relevent history reviewed, required imaging and test results available.  Performed  Maximum sterile technique was used including antiseptics, cap, gloves, gown, hand hygiene, mask and sheet. Skin prep: Chlorhexidine; local anesthetic administered A antimicrobial bonded/coated triple lumen catheter was placed in the right internal jugular vein using the Seldinger technique. Ultrasound guidance used.yes Catheter placed to 16 cm. Blood aspirated via all 3 ports and then flushed x 3. Line sutured x 2 and dressing applied.  Evaluation Blood flow good Complications: No apparent complications Patient did tolerate procedure well. Chest X-ray ordered to verify placement.  CXR: pending.  Brett Canales Minor ACNP [facility] PCCM Pager [phone] till 3 pm If no answer page [phone] 07/21/2013, 10:17 AM  Supervised and was present through the entire procedure.  Lonia Farber, MD Pulmonary and Critical Care Medicine [facility] Pager: [phone]

## 2013-07-21 NOTE — H&P (Signed)
PULMONARY  / CRITICAL CARE MEDICINE  Name: Elizabeth Love MRN: [id number] DOB: 1955-10-30    ADMISSION DATE:  07/21/2013 CONSULTATION DATE:  07/21/2013  REFERRING MD :  EDP PRIMARY SERVICE:  PCCM  CHIEF COMPLAINT:  Respiratory distress  BRIEF PATIENT DESCRIPTION: 57 yo with past medical history of nonischemic cardiomyopathy (EF 15% 04/11/13)  brought to ED in respiratory distress.  In ED noted to be hypertensive, placed on BiPAP and Nitroglycerin gtt.  PCCM was consulted.  SIGNIFICANT EVENTS / STUDIES:  11/3  Brought in respiratory distress, hypertensive.  Placed on BiPAP.  Nitroglycerin gtt started.  LINES / TUBES: R IJ TLC 11/3 >>> Foley 11/3 >>>  CULTURES:  ANTIBIOTICS:  The patient is mechanically ventilated with NIMV and unable to provide history, which was obtained for available medical records.  HISTORY OF PRESENT ILLNESS:  57 yo with past medical history of nonischemic cardiomyopathy (EF 15% 04/11/13)  brought to ED in respiratory distress.  In ED noted to be hypertensive, placed on BiPAP and Nitroglycerin gtt.  PCCM was consulted.  Past Medical History   Diagnosis  Date   .  Seizures    .  Hypertension    .  Stroke      2007 - R sided weakness and expressive aphasia with h/o behavioral problems related to this   .  Asthma    .  Diabetes mellitus    .  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  Chronic systolic CHF (congestive heart failure)      a. s/p AICD ~2007 (St. Jude) - previous care [place]. No known hx CAD. b. EF 10% by echo 04/2012.   Marland Kitchen  Atrial fibrillation      04/2012 - not felt to be an anticoag candidate long term at that time, but later had DVT so was on placed on Xarelto   .  Coagulopathy      "Protein C deficiency"   .  History of DVT (deep vein thrombosis)      a. 06/2012: RLE DVT, + protein C deficiency, placed on Xarelto. s/p IVC filter   .  Noncompliance      Due to mental status, family has had to coax her to take medicines   .   Hypothyroidism    .  VT (ventricular tachycardia)      a. h/o ICD ~2007-2008. b. Adm 08/2012 with UTI, had VT/ICD shock x 5 (hypokalemic)   .  Protein C deficiency     Past Surgical History   Procedure  Laterality  Date   .  Cholecystectomy     .  Appendectomy     .  Pacemaker insertion     .  Vena cava filter placement   07/19/2012     Procedure: INSERTION VENA-CAVA FILTER; Surgeon: Larina Earthly, MD; Location: [facility]; Service: Vascular; Laterality: [name];   .  Insert / replace / remove pacemaker       ICD   .  Abdominal hysterectomy      Current Outpatient Prescriptions   Medication  Sig  Dispense  Refill   .  acetaminophen (TYLENOL) 325 MG tablet  Take 650 mg by mouth every 6 (six) hours as needed for pain.     Marland Kitchen  albuterol (PROVENTIL) (2.5 MG/3ML) 0.083% nebulizer solution  Take 2.5 mg by nebulization every 6 (six) hours as needed. For cough or wheeze     .  ALPRAZolam (XANAX) 0.5 MG  tablet  Take 0.5 mg by mouth daily as needed for sleep or anxiety. For sleep or anxiety     .  amiodarone (PACERONE) 400 MG tablet  Take 0.5 tablets (200 mg total) by mouth daily.  15 tablet  3   .  digoxin (LANOXIN) 0.125 MG tablet  Take 1 tablet (0.125 mg total) by mouth daily.  30 tablet  0   .  furosemide (LASIX) 40 MG tablet  TAKE 1 TABLET BY MOUTH TWICE DAILY  60 tablet  5   .  levETIRAcetam (KEPPRA) 500 MG tablet  Take 1,000 mg by mouth 2 (two) times daily.     Marland Kitchen  levothyroxine (SYNTHROID, LEVOTHROID) 50 MCG tablet  Take 50 mcg by mouth daily.     Marland Kitchen  lisinopril (PRINIVIL,ZESTRIL) 10 MG tablet  Take 1 tablet (10 mg total) by mouth daily.  30 tablet  3   .  metolazone (ZAROXOLYN) 2.5 MG tablet  Take 2.5 mg by mouth daily as needed. For fluid retention     .  metoprolol succinate (TOPROL-XL) 25 MG 24 hr tablet  Take 25 mg by mouth daily.     Marland Kitchen  omeprazole (PRILOSEC) 40 MG capsule  Take 1 capsule (40 mg total) by mouth 2 (two) times daily.  60 capsule  1   .  oxyCODONE-acetaminophen (PERCOCET) 10-325 MG  per tablet  Take 1 tablet by mouth every 6 (six) hours as needed for pain. Scheduled per pharmacy  30 tablet  0   .  polyethylene glycol (MIRALAX / GLYCOLAX) packet  Take 17 g by mouth daily as needed. For constipation     .  potassium chloride SA (K-DUR,KLOR-CON) 20 MEQ tablet  Take 20 mEq by mouth 2 (two) times daily.     .  Rivaroxaban (XARELTO) 20 MG TABS  Take 1 tablet (20 mg total) by mouth daily with supper.  30 tablet  6   .  spironolactone (ALDACTONE) 25 MG tablet  Take 1 tablet (25 mg total) by mouth daily.  30 tablet  3    No current facility-administered medications for this visit.    Allergies   Allergen  Reactions   .  Penicillins  Rash   .  Sulfa Antibiotics  Rash    REVIEW OF SYSTEMS:  Unable to provide.  INTERVAL HISTORY:  VITAL SIGNS: Temp:  [98.4 F (36.9 C)] 98.4 F (36.9 C) (11/03 0806) Pulse Rate:  [75-120] 75 (11/03 0930) Resp:  [26-35] 26 (11/03 0930) BP: (116-195)/(80-157) 116/80 mmHg (11/03 0930) SpO2:  [99 %-100 %] 100 % (11/03 0930) FiO2 (%):  [60 %] 60 % (11/03 0905)  HEMODYNAMICS:   VENTILATOR SETTINGS: Vent Mode:  [-]  FiO2 (%):  [60 %] 60 %  INTAKE / OUTPUT: Intake/Output   None    PHYSICAL EXAMINATION: General:  Appears acutely ill, mechanically ventilated with NIMV, synchronous Neuro:  Somewhat encephalopathic but follows commands, nonfocal, cough / gag present HEENT:  PERRL, BiPAP mask on Cardiovascular:  RRR, no m/r/g Lungs:  Bilateral diminished air entry, bibasilar rales Abdomen:  Soft, mild generalized tenderness without rebound, bowel sounds diminished Musculoskeletal:  Moves all extremities, no edema Skin:  Intact  LABS:  Recent Labs Lab 07/21/13 0902  HGB 9.2*  WBC 11.0*  PLT 253  LATICACIDVEN 7.1*    Recent Labs Lab 07/21/13 0809  GLUCAP 179*   CXR:  11/3 >>> Cardiomegaly, bilateral airspace / interstitial process  ASSESSMENT / PLAN:  PULMONARY A:  Acute respiratory failure  in setting of pulmonary edema.   Doubt pneumonia.  History of asthma, no active bronchospasm. P:   Gaol SpO2>92 Supplemental oxygen BiPAP PRN May need intubation / artificial ventilation Trend ABG / CXR Albuterol PRN  CARDIOVASCULAR A: Hypertensive emergency. Acute on chronic combined ( systolic / diastolic )  congestive heart failure. Non ischemic cardiomyopathy. H/o VT. S/p pacemaker placement. P:  Goal BP<160/90 Trend troponin / lactate TTE D/c Nitroglycerin gtt as rel;atively hypotensive now Continue preadmission Lisinopril, Metoprolol, Digoxin, Amiodarone Cardiology consulted by EDP Diuresis as below  RENAL A:  Fluid overload. P:   Goal CVP>10 Trend BMP BNP Lasix 40 IV q12h Continue preadmission Aldactone  GASTROINTESTINAL A:  Initially complained of abdominal pain, now resolved. P:   NPO except medications  TF if remains intubated > 24 hours Protonix as preadmission  LFT, amylase, lipase Abdomen / pelvis CT if recurrent abdominal pain  HEMATOLOGIC A:  H/o DVT s/p SVC filter placement.  Reportedly on Xarelto. Protein C deficiency.  P:  Trend CBC Xarelto per Pharmacy APTT / INR  INFECTIOUS A:  No evidence of acute infection. P:   PCT  ENDOCRINE  A:  DM.  Hyperglycemia. P:   SSI Synthroid  NEUROLOGIC A:  Acute encephalopathy. History of seizures, none since presentation. P:   Keppra as preadmission Drug screen Hold Xanax / Percocet  I have personally obtained history, examined patient, evaluated and interpreted laboratory and imaging results, reviewed medical records, formulated assessment / plan and placed orders.  CRITICAL CARE:  The patient is critically ill with multiple organ systems failure and requires high complexity decision making for assessment and support, frequent evaluation and titration of therapies, application of advanced monitoring technologies and extensive interpretation of multiple databases. Critical Care Time devoted to patient care services described in this  note is 60 minutes.   Lonia Farber, MD Pulmonary and Critical Care Medicine [facility] Pager: [phone]  07/21/2013, 9:48 AM

## 2013-07-21 NOTE — Progress Notes (Signed)
Pt transported from ED to 2M04 on Bipap. No complications noted.

## 2013-07-21 NOTE — Progress Notes (Signed)
eLink Physician-Brief Progress Note Patient Name: Elizabeth Love DOB: 08-Oct-1955 MRN: [id number]  Date of Service  07/21/2013   HPI/Events of Note   Typical hedaches described pt  Nitro reduced, no help  eICU Interventions  Add home percocet   Intervention Category Minor Interventions: Agitation / anxiety - evaluation and management  Nelda Bucks. 07/21/2013, 5:16 PM

## 2013-07-21 NOTE — Progress Notes (Signed)
Still with some  Headache. RN says cards wanted some nitro on board  Plan Reduce nitro to 2.5 Monitor   Dr. Kalman Shan, M.D., [facility].C.P Pulmonary and Critical Care Medicine Staff Physician [facility] [place] Pulmonary and Critical Care Pager: [phone], If no answer or between  15:00h - 7:00h: call [phone]  07/21/2013 8:19 PM

## 2013-07-21 NOTE — Progress Notes (Signed)
eLink Physician-Brief Progress Note Patient Name: Elizabeth Love DOB: 09/10/1956 MRN: [id number]  Date of Service  07/21/2013   HPI/Events of Note   Now with abdomonina l pain . RN Says non specifically tender  eICU Interventions  kub Amylase, lipase, ck, troponin, bmet   Intervention Category Intermediate Interventions: Abdominal pain - evaluation and management  Presten Joost 07/21/2013, 9:23 PM

## 2013-07-21 NOTE — Progress Notes (Signed)
Bipap not needed at this time vitals WNL. RT will continue to monitor.

## 2013-07-21 NOTE — ED Notes (Signed)
Received pt from home with c/o left sided chest pain, defib firing, headache and abdominal pain ongoing since Friday. Pt seen her Friday for same but not feeling any better.

## 2013-07-21 NOTE — Progress Notes (Signed)
ANTICOAGULATION CONSULT NOTE - Initial Consult  Pharmacy Consult for rivaroxaban  Indication: DVT, protein C def  Not on File  Patient Measurements:  Vital Signs: Temp: 98.4 F (36.9 C) (11/03 0806) Temp src: Oral (11/03 0806) BP: 115/78 mmHg (11/03 1251) Pulse Rate: 80 (11/03 1251)  Labs:  Recent Labs  07/21/13 0902 07/21/13 1215  HGB 9.2*  --   HCT 30.2*  --   PLT 253  --   APTT  --  33  LABPROT  --  25.8*  INR  --  2.45*  CREATININE 1.15*  --   TROPONINI <0.30  --     CrCl is unknown because there is no height on file for the current visit.   Medical History: No past medical history on file.  Medications:  Prescriptions prior to admission  Medication Sig Dispense Refill  . ALPRAZolam (XANAX) 0.5 MG tablet Take 0.5 mg by mouth at bedtime as needed for sleep.      Marland Kitchen amiodarone (PACERONE) 400 MG tablet Take 200 mg by mouth See admin instructions. Takes 200mg  on Mon, Tues, Wed, Thurs and Fri's only (none on weekends)      . furosemide (LASIX) 40 MG tablet Take 40 mg by mouth 2 (two) times daily.      Marland Kitchen levETIRAcetam (KEPPRA) 1000 MG tablet Take 2,000 mg by mouth 2 (two) times daily.      Marland Kitchen levothyroxine (SYNTHROID, LEVOTHROID) 50 MCG tablet Take 50 mcg by mouth daily before breakfast.      . lisinopril (PRINIVIL,ZESTRIL) 5 MG tablet Take 5 mg by mouth daily.      . metoprolol tartrate (LOPRESSOR) 25 MG tablet Take 25 mg by mouth 2 (two) times daily.      . mupirocin ointment (BACTROBAN) 2 % Apply topically 3 (three) times daily.      Marland Kitchen omeprazole (PRILOSEC) 20 MG capsule Take 20 mg by mouth 2 (two) times daily.      Marland Kitchen oxyCODONE-acetaminophen (PERCOCET) 10-325 MG per tablet Take 1 tablet by mouth every 8 (eight) hours as needed for pain.      . potassium chloride SA (K-DUR,KLOR-CON) 20 MEQ tablet Take 20 mEq by mouth daily.      . QUEtiapine Fumarate (SEROQUEL XR) 150 MG 24 hr tablet Take 150 mg by mouth at bedtime.      . Rivaroxaban (XARELTO) 20 MG TABS tablet  Take 20 mg by mouth daily.      Marland Kitchen spironolactone (ALDACTONE) 25 MG tablet Take 25 mg by mouth daily.       Scheduled:  . amiodarone  200 mg Oral Daily  . digoxin  0.125 mg Oral Daily  . furosemide  40 mg Intravenous Q12H  . insulin aspart  0-15 Units Subcutaneous Q4H  . levETIRAcetam  1,000 mg Intravenous Q12H  . [START ON 07/22/2013] levothyroxine  50 mcg Oral QAC breakfast  . lisinopril  10 mg Oral Daily  . magnesium sulfate 1 - 4 g bolus IVPB  2 g Intravenous Once  . metoprolol tartrate  25 mg Oral BID  . pantoprazole  40 mg Oral Daily  . spironolactone  25 mg Oral Daily   Infusions:    Assessment: Pt was admitted for resp distress and HTN urgency. She was tx to ICU. She has been on Xarelto for DVT since Oct 10/13. Cont xarelto while here.    Goal of Therapy:  Appropriate anticoagulation Monitor platelets by anticoagulation protocol: Yes   Plan:  Xarelto 20mg  PO qday  Ulyses [place] [place] 07/21/2013,1:45 PM

## 2013-07-22 DIAGNOSIS — I059 Rheumatic mitral valve disease, unspecified: Secondary | ICD-10-CM

## 2013-07-22 LAB — CBC
HCT: 27.8 % — ABNORMAL LOW (ref 36.0–46.0)
Hemoglobin: 8.7 g/dL — ABNORMAL LOW (ref 12.0–15.0)
MCH: 25.3 pg — ABNORMAL LOW (ref 26.0–34.0)
MCHC: 31.3 g/dL (ref 30.0–36.0)
RBC: 3.44 MIL/uL — ABNORMAL LOW (ref 3.87–5.11)
WBC: 7.3 10*3/uL (ref 4.0–10.5)

## 2013-07-22 LAB — GLUCOSE, CAPILLARY
Glucose-Capillary: 119 mg/dL — ABNORMAL HIGH (ref 70–99)
Glucose-Capillary: 157 mg/dL — ABNORMAL HIGH (ref 70–99)
Glucose-Capillary: 93 mg/dL (ref 70–99)

## 2013-07-22 LAB — BASIC METABOLIC PANEL
BUN: 16 mg/dL (ref 6–23)
CO2: 26 mEq/L (ref 19–32)
Calcium: 9 mg/dL (ref 8.4–10.5)
Chloride: 101 mEq/L (ref 96–112)
Glucose, Bld: 93 mg/dL (ref 70–99)
Sodium: 137 mEq/L (ref 135–145)

## 2013-07-22 LAB — URINE DRUGS OF ABUSE SCREEN W ALC, ROUTINE (REF LAB)
Amphetamine Screen, Ur: NEGATIVE
Benzodiazepines.: NEGATIVE
Ethyl Alcohol: <10 mg/dL (ref <10)
Marijuana Metabolite: NEGATIVE
Opiate Screen, Urine: NEGATIVE
Phencyclidine (PCP): NEGATIVE
Propoxyphene: NEGATIVE

## 2013-07-22 LAB — PROCALCITONIN: Procalcitonin: 0.25 ng/mL

## 2013-07-22 LAB — MAGNESIUM: Magnesium: 2.2 mg/dL (ref 1.5–2.5)

## 2013-07-22 MED ORDER — INSULIN ASPART 100 UNIT/ML ~~LOC~~ SOLN
3.0000 [IU] | Freq: Three times a day (TID) | SUBCUTANEOUS | Status: DC
  Administered 2013-07-22 – 2013-07-25 (×8): 3 [IU] via SUBCUTANEOUS

## 2013-07-22 MED ORDER — FENTANYL CITRATE 0.05 MG/ML IJ SOLN: 25.0000 ug | Freq: Once | INTRAMUSCULAR | Status: DC

## 2013-07-22 MED ORDER — INSULIN ASPART 100 UNIT/ML ~~LOC~~ SOLN
0.0000 [IU] | Freq: Three times a day (TID) | SUBCUTANEOUS | Status: DC
  Administered 2013-07-22: 3 [IU] via SUBCUTANEOUS
  Administered 2013-07-23 (×2): 2 [IU] via SUBCUTANEOUS
  Administered 2013-07-23: 13:00:00 3 [IU] via SUBCUTANEOUS
  Administered 2013-07-24 – 2013-07-25 (×2): 2 [IU] via SUBCUTANEOUS

## 2013-07-22 MED ORDER — FUROSEMIDE 40 MG PO TABS
40.0000 mg | ORAL_TABLET | Freq: Every day | ORAL | Status: DC
  Administered 2013-07-22: 40 mg via ORAL
  Filled 2013-07-22 (×3): qty 1

## 2013-07-22 MED ORDER — QUETIAPINE FUMARATE ER 50 MG PO TB24
150.0000 mg | ORAL_TABLET | Freq: Every day | ORAL | Status: DC
  Administered 2013-07-23: 22:00:00 150 mg via ORAL
  Filled 2013-07-22 (×4): qty 3

## 2013-07-22 MED ORDER — POTASSIUM CHLORIDE 10 MEQ/50ML IV SOLN
10.0000 meq | INTRAVENOUS | Status: AC
  Administered 2013-07-22 (×4): 10 meq via INTRAVENOUS
  Filled 2013-07-22 (×3): qty 50

## 2013-07-22 MED ORDER — LEVETIRACETAM 500 MG PO TABS
1000.0000 mg | ORAL_TABLET | Freq: Two times a day (BID) | ORAL | Status: DC
  Administered 2013-07-22 – 2013-07-25 (×5): 1000 mg via ORAL
  Filled 2013-07-22 (×9): qty 2

## 2013-07-22 MED ORDER — INSULIN ASPART 100 UNIT/ML ~~LOC~~ SOLN: 0.0000 [IU] | Freq: Every day | SUBCUTANEOUS | Status: DC

## 2013-07-22 MED ORDER — FENTANYL CITRATE 0.05 MG/ML IJ SOLN
50.0000 ug | Freq: Once | INTRAMUSCULAR | Status: AC
  Administered 2013-07-22: 50 ug via INTRAVENOUS
  Filled 2013-07-22: qty 2

## 2013-07-22 NOTE — Progress Notes (Signed)
[employer] ADULT ICU REPLACEMENT PROTOCOL FOR AM LAB REPLACEMENT ONLY  The patient does apply for the [facility] Adult ICU Electrolyte Replacment Protocol based on the criteria listed below:   1. Is GFR >/= 40 ml/min? yes  Patient's GFR today is 73 2. Is urine output >/= 0.5 ml/kg/hr for the last 6 hours? yes Patient's UOP is 1.4 ml/kg/hr 3. Is BUN < 60 mg/dL? yes  Patient's BUN today is 16 4. Abnormal electrolyte(s): Potassium 5. Ordered repletion with: Potassium per protocol  Elizabeth Love P 07/22/2013 5:37 AM

## 2013-07-22 NOTE — Progress Notes (Signed)
Patient Name: Elizabeth Love      SUBJECTIVE: complaints of abdominal pain,  Denies sob at this point  She has always been emotionally labile but....    PMHx  Seizures  .  Hypertension  .  Stroke  2007 - R sided weakness and expressive aphasia with h/o behavioral problems related to this  .  Asthma  .  Diabetes mellitus  .  Renal disorder  ?infection per brother  .  Chronic systolic CHF (congestive heart failure)  a. s/p AICD ~2007 ([employer]. [employer]) - previous care [place]. No known hx CAD. b. EF 10% by echo 04/2012.  Marland Kitchen  Atrial fibrillation  04/2012 - not felt to be an anticoag candidate long term at that time, but later had DVT so was on placed on Xarelto  .  Coagulopathy  "Protein C deficiency"  .  History of DVT (deep vein thrombosis)  a. 06/2012: RLE DVT, + protein C deficiency, placed on Xarelto. s/p IVC filter  .  Noncompliance  Due to mental status, family has had to coax her to take medicines  .  Hypothyroidism  .  VT (ventricular tachycardia)  a. h/o ICD ~2007-2008. b. Adm 08/2012 with UTI, had VT/ICD shock x 5 (hypokalemic)  .  Protein C deficiency    Scheduled Meds:  Scheduled Meds: . amiodarone  200 mg Oral Daily  . furosemide  40 mg Intravenous Q12H  . insulin aspart  0-15 Units Subcutaneous Q4H  . levETIRAcetam  1,000 mg Intravenous Q12H  . levothyroxine  50 mcg Oral QAC breakfast  . lisinopril  10 mg Oral Daily  . metoprolol tartrate  25 mg Oral BID  . nitroGLYCERIN  2-200 mcg/min Intravenous Once  . pantoprazole  40 mg Oral Daily  . potassium chloride  10 mEq Intravenous Q1 Hr x 4  . rivaroxaban  20 mg Oral Q supper  . spironolactone  25 mg Oral Daily   Continuous Infusions: . sodium chloride 10 mL/hr at 07/21/13 2045    PHYSICAL EXAM Filed Vitals:   07/22/13 0459 07/22/13 0500 07/22/13 0600 07/22/13 0755  BP:  106/61 112/66   Pulse:  69 66   Temp:    98.2 F (36.8 C)  TempSrc:    Oral  Resp:      Height:      Weight:  109 lb 2 oz (49.5 kg)     SpO2:  100% 95%     Alert and complaining of abd pain, crying  Clear x small wheeze RRR Device pocket well healed; without hematoma or erythema.  There is no tethering  It is tender to touch  RRR  No edema      TELEMETRY: Reviewed telemetry pt in  NSR:    Intake/Output Summary (Last 24 hours) at 07/22/13 0759 Last data filed at 07/22/13 0641  Gross per 24 hour  Intake 2008.5 ml  Output    965 ml  Net 1043.5 ml    LABS: Basic Metabolic Panel:  Recent Labs Lab 07/21/13 0902 07/21/13 1215 07/22/13 0434  NA 140  --  137  K 4.1  --  3.2*  CL 104  --  101  CO2 19  --  26  GLUCOSE 221*  --  93  BUN 16  --  16  CREATININE 1.15*  --  0.99  CALCIUM 9.5  --  9.0  MG  --  1.7 2.2  PHOS  --  3.8 4.2  Cardiac Enzymes:  Recent Labs  07/21/13 0902 07/21/13 1548 07/21/13 2123  TROPONINI <0.30 <0.30 <0.30   CBC:  Recent Labs Lab 07/21/13 0902 07/21/13 2123 07/22/13 0434  WBC 11.0* 8.2 7.3  NEUTROABS 7.4 5.7  --   HGB 9.2* 8.5* 8.7*  HCT 30.2* 27.9* 27.8*  MCV 82.1 80.9 80.8  PLT 253 211 209   PROTIME:  Recent Labs  07/21/13 1215  LABPROT 25.8*  INR 2.45*   Liver Function Tests:  Recent Labs  07/21/13 0902 07/21/13 2123  AST 29 26  ALT 16 14  ALKPHOS 80 70  BILITOT 0.9 1.1  PROT 8.3 7.3  ALBUMIN 3.4* 3.1*    Recent Labs  07/21/13 0902 07/21/13 1215 07/21/13 2123  LIPASE 15  --  11  AMYLASE  --  64 61   BNP: BNP (last 3 results)  Recent Labs  07/21/13 0902  PROBNP 9726.0*   D-Dimer: No results found for this basename: DDIMER,  in the last 72 hours Hemoglobin A1C: No results found for this basename: HGBA1C,  in the last 72 hours Fasting Lipid Panel: No results found for this basename: CHOL, HDL, LDLCALC, TRIG, CHOLHDL, LDLDIRECT,  in the last 72 hours Thyroid Function Tests:  Recent Labs  07/21/13 0902  TSH 1.706     ASSESSMENT AND PLAN:  Active Problems:   Acute combined systolic and  diastolic heart failure   Acute respiratory failure   Hypertensive emergency Blood pressure well controlled Will stop NTG  Needs further eval of abd pain K repletion-ongoing   Signed, [name] MD  07/22/2013

## 2013-07-22 NOTE — Progress Notes (Signed)
  Echocardiogram 2D Echocardiogram has not  been performed. Patient is refusing echo. Nurse notified.  Othelia Riederer 07/22/2013, 8:26 AM

## 2013-07-22 NOTE — Progress Notes (Signed)
PULMONARY  / CRITICAL CARE MEDICINE  Name: Elizabeth Love MRN: [id number] DOB: 08-15-1956    ADMISSION DATE:  07/21/2013 CONSULTATION DATE:  07/21/2013  REFERRING MD :  EDP PRIMARY SERVICE:  PCCM  CHIEF COMPLAINT:  Respiratory distress  BRIEF PATIENT DESCRIPTION: 57 yo with past medical history of nonischemic cardiomyopathy (EF 15% 04/11/13)  brought to ED in respiratory distress.  In ED noted to be hypertensive, placed on BiPAP and Nitroglycerin gtt.  PCCM was consulted.  SIGNIFICANT EVENTS / STUDIES:  11/3  Brought in respiratory distress, hypertensive.  Placed on BiPAP.  Nitroglycerin gtt started. 11/3 KUB negative 11/04 Off NTG gtt. BP control much improved. Transfer to tele. TRH to assume care as of AM 11/05 and PCCM to sign off  LINES / TUBES: R IJ TLC 11/3 >>   CULTURES:  ANTIBIOTICS:  INTERVAL HISTORY:   Patient is confused, and distraught.  Nursing reports that patient continues to complain of bad headache, and will not take her PO meds.    VITAL SIGNS: Temp:  [97.4 F (36.3 C)-99 F (37.2 C)] 98.2 F (36.8 C) (11/04 0755) Pulse Rate:  [60-97] 70 (11/04 0800) Resp:  [15-34] 28 (11/04 0800) BP: (95-126)/(58-88) 110/77 mmHg (11/04 0800) SpO2:  [95 %-100 %] 96 % (11/04 0800) FiO2 (%):  [40 %] 40 % (11/03 1200) Weight:  [109 lb 2 oz (49.5 kg)-121 lb 11.1 oz (55.2 kg)] 109 lb 2 oz (49.5 kg) (11/04 0459)  HEMODYNAMICS: CVP:  [0 mmHg-16 mmHg] 4 mmHg VENTILATOR SETTINGS: Vent Mode:  [-]  FiO2 (%):  [40 %] 40 %  INTAKE / OUTPUT: Intake/Output     11/03 0701 - 11/04 0700 11/04 0701 - 11/05 0700   I.V. (mL/kg) 463.5 (9.4)    Other 1225    IV Piggyback 320 50   Total Intake(mL/kg) 2008.5 (40.6) 50 (1)   Urine (mL/kg/hr) 965    Total Output 965     Net +1043.5 +50        Urine Occurrence 2 x     PHYSICAL EXAMINATION: General:  Crying and moaning in bed.   Neuro:  confused, Oriented to person, PERL, right sided weakness, intermittently follows commands. HEENT:   PERRL, oropharynx clear Cardiovascular:  RRR, no m/r/g Lungs:  Distant breath sounds, diminished at bases Abdomen:  Soft, bowel sounds diminished Musculoskeletal:  Moves all extremities noted weakness in right upper and lower extremities, no edema Skin:  Intact  LABS:  Recent Labs Lab 07/21/13 0902 07/21/13 0947 07/21/13 1215 07/21/13 1548 07/21/13 2123 07/21/13 2214 07/22/13 0434  HGB 9.2*  --   --   --  8.5*  --  8.7*  WBC 11.0*  --   --   --  8.2  --  7.3  PLT 253  --   --   --  211  --  209  NA 140  --   --   --   --   --  137  K 4.1  --   --   --   --   --  3.2*  CL 104  --   --   --   --   --  101  CO2 19  --   --   --   --   --  26  GLUCOSE 221*  --   --   --   --   --  93  BUN 16  --   --   --   --   --  16  CREATININE 1.15*  --   --   --   --   --  0.99  CALCIUM 9.5  --   --   --   --   --  9.0  MG  --   --  1.7  --   --   --  2.2  PHOS  --   --  3.8  --   --   --  4.2  AST 29  --   --   --  26  --   --   ALT 16  --   --   --  14  --   --   ALKPHOS 80  --   --   --  70  --   --   BILITOT 0.9  --   --   --  1.1  --   --   PROT 8.3  --   --   --  7.3  --   --   ALBUMIN 3.4*  --   --   --  3.1*  --   --   APTT  --   --  33  --   --   --   --   INR  --   --  2.45*  --   --   --   --   LATICACIDVEN 7.1*  --   --  1.0 1.0 0.9  --   TROPONINI <0.30  --   --  <0.30 <0.30  --   --   PROCALCITON  --   --   --  0.20  --   --  0.25  PROBNP 9726.0*  --   --   --   --   --   --   PHART  --  7.339*  --   --   --   --   --   PCO2ART  --  40.8  --   --   --   --   --   PO2ART  --  225.0*  --   --   --   --   --     Recent Labs Lab 07/21/13 1547 07/21/13 2000 07/21/13 2346 07/22/13 0411 07/22/13 0755  GLUCAP 106* 99 93 99 99   CXR:  NNF  ASSESSMENT / PLAN:  PULMONARY A:   Acute respiratory failure- resolved Pulm edema History of asthma, no active bronchospasm. P:   - Supplemental oxygen - Albuterol PRN  CARDIOVASCULAR A:  Hypertensive emergency  resolved Acute on chronic combined congestive heart failure.  Non ischemic cardiomyopathy  H/o VT S/p pacemaker placement. P:  Cards managing. Their assistance is appreciated  RENAL A:  hypervolemia Hypokalemia P:   Monitor BMET intermittently Correct electrolytes as indicated Change furosemide to PO 11/04  GASTROINTESTINAL A:  Abdominal pain, resolved. P:   - Advance to heart healthy diet  HEMATOLOGIC A:  H/o DVT s/p SVC filter placement H/O Protein C deficiency - chronically on Xarelto P:  Monitor CBC intermittently Resume Xarelto per Pharmacy  INFECTIOUS A:  No evidence of acute infection. P:   Micro and abx as above  ENDOCRINE  A:  DM.  Hyperglycemia. Hypothyroidism P:   Change SSI to ACHS Cont Synthroid  NEUROLOGIC A:  Acute encephalopathy.  History of seizures Afitation P:   Cont Keppra  Cont Seroquel 150 qhs Holding Xanax   Forcucci, [employer] Physician Assistant 07/22/2013, 9:14 AM  Transfer to Tele. TRH to assume care  as of AM 11/05 and PCCM to sign off  Billy Fischer, MD ; [facility] [phone].  After 5:30 PM or weekends, call [phone]

## 2013-07-22 NOTE — Progress Notes (Signed)
eLink Physician-Brief Progress Note Patient Name: Elizabeth Love DOB: [birth year] MRN: [id number]  Date of Service  07/22/2013   HPI/Events of Note  Patient with Abd pain.  So far KUB, amylase, lipase and lactate are normal.  Requesting IV pain medication.  Current VSS.   eICU Interventions  Plan: Continue to f/u labs Fentanyl 50 mcg IV now - may repeat times one Continue to monitor   Intervention Category Intermediate Interventions: Pain - evaluation and management  DETERDING,ELIZABETH 07/22/2013, 12:42 AM

## 2013-07-23 LAB — BASIC METABOLIC PANEL
BUN: 23 mg/dL (ref 6–23)
CO2: 25 mEq/L (ref 19–32)
Chloride: 100 mEq/L (ref 96–112)
Creatinine, Ser: 1.24 mg/dL — ABNORMAL HIGH (ref 0.50–1.10)
Potassium: 3.8 mEq/L (ref 3.5–5.1)

## 2013-07-23 LAB — GLUCOSE, CAPILLARY
Glucose-Capillary: 143 mg/dL — ABNORMAL HIGH (ref 70–99)
Glucose-Capillary: 161 mg/dL — ABNORMAL HIGH (ref 70–99)
Glucose-Capillary: 150 mg/dL — ABNORMAL HIGH (ref 70–99)

## 2013-07-23 NOTE — Progress Notes (Signed)
Patient: Elizabeth Love Date of Encounter: 07/23/2013, 10:05 AM Admit date: 07/21/2013     Subjective  Elizabeth Love reports headache and is requesting pain meds. She denies CP or SOB. She appears confused and tearful. Per nursing staff, this is her baseline.   Objective  Physical Exam: Vitals: BP 100/53  Pulse 59  Temp(Src) 98.6 F (37 C) (Oral)  Resp 20  Ht 4\' 11"  (1.499 m)  Wt 111 lb 1.8 oz (50.4 kg)  BMI 22.43 kg/m2  SpO2 96% General: Well developed, thin 57 year old female in no acute distress. She does not sit still during exam and frequently sits up and lays down throughout. Neck: Supple. JVD does not apprear elevated as best I can tell. Lungs: Unable to auscultate adequately. Breathing is unlabored. Heart: Limited but regular S1 S2 without murmur.  Abdomen: Appears soft, non-distended. Extremities: No clubbing or cyanosis. No edema.   Neuro: Alert but confused. Tearful. Moves all extremities spontaneously.   Intake/Output:  Intake/Output Summary (Last 24 hours) at 07/23/13 1005 Last data filed at 07/23/13 1610  Gross per 24 hour  Intake  720.4 ml  Output   1170 ml  Net -449.6 ml    Inpatient Medications:  . amiodarone  200 mg Oral Daily  . furosemide  40 mg Oral Daily  . insulin aspart  0-15 Units Subcutaneous TID WC  . insulin aspart  0-5 Units Subcutaneous QHS  . insulin aspart  3 Units Subcutaneous TID WC  . levETIRAcetam  1,000 mg Oral BID  . levothyroxine  50 mcg Oral QAC breakfast  . lisinopril  10 mg Oral Daily  . metoprolol tartrate  25 mg Oral BID  . QUEtiapine  150 mg Oral QHS  . rivaroxaban  20 mg Oral Q supper  . spironolactone  25 mg Oral Daily   . sodium chloride 10 mL/hr at 07/21/13 2045    Labs:  Recent Labs  07/21/13 1215 07/22/13 0434 07/23/13 0409  NA  --  137 137  K  --  3.2* 3.8  CL  --  101 100  CO2  --  26 25  GLUCOSE  --  93 127*  BUN  --  16 23  CREATININE  --  0.99 1.24*  CALCIUM  --  9.0 9.2  MG 1.7 2.2  --   PHOS  3.8 4.2  --     Recent Labs  07/21/13 0902 07/21/13 2123  AST 29 26  ALT 16 14  ALKPHOS 80 70  BILITOT 0.9 1.1  PROT 8.3 7.3  ALBUMIN 3.4* 3.1*    Recent Labs  07/21/13 0902 07/21/13 1215 07/21/13 2123  LIPASE 15  --  11  AMYLASE  --  64 61    Recent Labs  07/21/13 0902 07/21/13 2123 07/22/13 0434  WBC 11.0* 8.2 7.3  NEUTROABS 7.4 5.7  --   HGB 9.2* 8.5* 8.7*  HCT 30.2* 27.9* 27.8*  MCV 82.1 80.9 80.8  PLT 253 211 209    Recent Labs  07/21/13 0902 07/21/13 1548 07/21/13 2123  TROPONINI <0.30 <0.30 <0.30    Recent Labs  07/21/13 0902  TSH 1.706    Recent Labs  07/21/13 1215  INR 2.45*    Radiology/Studies: Dg Chest Port 1 View 07/21/2013    IMPRESSION: Interval improvement of minimal interstitial edema. Persistent hazy left base opacification likely a combination of small effusions/atelectasis.  Stable moderate cardiomegaly.  Right IJ central venous catheter with tip at the origin of  the cavoatrial junction. No pneumothorax.    Electronically Signed   By: Elberta Fortis M.D.   On: 07/21/2013 10:42   Dg Chest Portable 1 View 07/21/2013     IMPRESSION: 1. Cardiomegaly and interstitial infiltrate/edema. 2. Left lower lobe infiltrate or atelectasis.    Electronically Signed   By: Rosalie Gums M.D.   On: 07/21/2013 09:06   Dg Abd Portable 1v 07/21/2013     IMPRESSION: No acute findings. No evidence of a bowel obstruction.    Electronically Signed   By: Amie Portland M.D.   On: 07/21/2013 21:47   Echocardiogram: ordered by PCCM; pending Telemetry: SR with intermittent A pacing   Assessment and Plan  1. Acute on chronic combined systolic and diastolic HF - improved 2. Hypertensive emergency - improved 3. Severe NICM, EF 10% by echo August 2013 - continue current regimen 4. Paroxysmal VT - stable on amiodarone 5. Prior CVA  No further cardiac / EP work-up planned. Will sign off. Dr. Graciela Husbands to see. Signed, EDMISTEN, BROOKE PA-C  Agree with  above   Spoke to nursing re headache

## 2013-07-23 NOTE — Progress Notes (Signed)
[facility] PROGRESS NOTE  Elizabeth Love ZOX:[id number] DOB: 1955/12/08 DOA: 07/21/2013 PCP: Estill Cotta, MD  HPI: 57 yo with past medical history of nonischemic cardiomyopathy (EF 15% 04/11/13) brought to ED in respiratory distress. In ED noted to be hypertensive, placed on BiPAP and Nitroglycerin gtt. PCCM was consulted.  Assessment/Plan:  NICM with acute on chronic combined systolic and diastolic HF, EF 81%. Patient s/p ICD Acute hypoxic respiratory failure - due to hypertensive emergency, initially on BiPAP, resolved, now on room air.  HTN emergency - initially admitted to ICU, on nitro gtt.  - Lisinopril 10, Lasix 40, Spironolactone 25, Metoprolol 25 BID. Intermittently refusing medications. Controlled. Paroxysmal VT - on Amiodarone Prior CVA - no new focal deficits Headache - continue with when necessary Percocet. Most likely due to her migraines.  A. Fib - presently in sinus rhythm and rate controlled. On xarelto.  History of protein C deficiency: continue xarelto.  History of hypothyroidism - continue Synthroid.  Seizure: continue keppra.  Diet: Heart Fluids: none DVT Prophylaxis: Xarelto  Code Status: Full Family Communication: none  Disposition Plan: inpatient  Consultants:  Cardiology  PCCM  Procedures:  2D echo - patient refused.   Antibiotics  HPI/Subjective: - crying constantly, cannot elaborate why.  Objective: Filed Vitals:   07/22/13 2027 07/22/13 2053 07/23/13 0500 07/23/13 1234  BP: 77/38 84/52 100/53 104/58  Pulse: 67  59   Temp: 98.2 F (36.8 C)  98.6 F (37 C)   TempSrc: Oral  Oral   Resp: 18  20   Height:      Weight:   50.4 kg (111 lb 1.8 oz)   SpO2: 100%  96%     Intake/Output Summary (Last 24 hours) at 07/23/13 1300 Last data filed at 07/23/13 0821  Gross per 24 hour  Intake    660 ml  Output    720 ml  Net    -60 ml   Filed Weights   07/22/13 0459 07/22/13 1729 07/23/13 0500  Weight: 49.5 kg (109 lb 2  oz) 50.44 kg (111 lb 3.2 oz) 50.4 kg (111 lb 1.8 oz)    Exam:   General:  Very emotional  Cardiovascular: regular rate and rhythm, without MRG  Respiratory: patient crying could not assess  Abdomen: soft, not tender to palpation  MSK: no peripheral edema  Neuro: non focal  Data Reviewed: Basic Metabolic Panel:  Recent Labs Lab 07/21/13 0902 07/21/13 1215 07/22/13 0434 07/23/13 0409  NA 140  --  137 137  K 4.1  --  3.2* 3.8  CL 104  --  101 100  CO2 19  --  26 25  GLUCOSE 221*  --  93 127*  BUN 16  --  16 23  CREATININE 1.15*  --  0.99 1.24*  CALCIUM 9.5  --  9.0 9.2  MG  --  1.7 2.2  --   PHOS  --  3.8 4.2  --    Liver Function Tests:  Recent Labs Lab 07/21/13 0902 07/21/13 2123  AST 29 26  ALT 16 14  ALKPHOS 80 70  BILITOT 0.9 1.1  PROT 8.3 7.3  ALBUMIN 3.4* 3.1*    Recent Labs Lab 07/21/13 0902 07/21/13 1215 07/21/13 2123  LIPASE 15  --  11  AMYLASE  --  64 61   CBC:  Recent Labs Lab 07/21/13 0902 07/21/13 2123 07/22/13 0434  WBC 11.0* 8.2 7.3  NEUTROABS 7.4 5.7  --   HGB 9.2* 8.5*  8.7*  HCT 30.2* 27.9* 27.8*  MCV 82.1 80.9 80.8  PLT 253 211 209   Cardiac Enzymes:  Recent Labs Lab 07/21/13 0902 07/21/13 1548 07/21/13 2123  TROPONINI <0.30 <0.30 <0.30   BNP (last 3 results)  Recent Labs  07/21/13 0902  PROBNP 9726.0*   CBG:  Recent Labs Lab 07/22/13 1221 07/22/13 1739 07/22/13 2208 07/23/13 0615 07/23/13 1120  GLUCAP 157* 119* 130* 143* 161*   Recent Results (from the past 240 hour(s))  MRSA PCR SCREENING     Status: None   Collection Time    07/21/13  1:03 PM      Result Value Range Status   MRSA by PCR NEGATIVE  NEGATIVE Final   Comment:            The GeneXpert MRSA Assay (FDA     approved for NASAL specimens     only), is one component of a     comprehensive MRSA colonization     surveillance program. It is not     intended to diagnose MRSA     infection nor to guide or     monitor treatment for      MRSA infections.    Studies: Dg Abd Portable 1v  07/21/2013   CLINICAL DATA:  Abdominal pain and tenderness  EXAM: PORTABLE ABDOMEN - 1 VIEW  COMPARISON:  None.  FINDINGS: Normal bowel gas pattern. No evidence of obstruction.  There are surgical vascular clips in the right upper quadrant reflecting a prior cholecystectomy. A vena cava filter has its superior tip superimposed over the right lower aspect of L2. The soft tissues are otherwise unremarkable.  No acute bony abnormality.  IMPRESSION: No acute findings. No evidence of a bowel obstruction.   Electronically Signed   By: Amie Portland M.D.   On: 07/21/2013 21:47    Scheduled Meds: . amiodarone  200 mg Oral Daily  . furosemide  40 mg Oral Daily  . insulin aspart  0-15 Units Subcutaneous TID WC  . insulin aspart  0-5 Units Subcutaneous QHS  . insulin aspart  3 Units Subcutaneous TID WC  . levETIRAcetam  1,000 mg Oral BID  . levothyroxine  50 mcg Oral QAC breakfast  . lisinopril  10 mg Oral Daily  . metoprolol tartrate  25 mg Oral BID  . QUEtiapine  150 mg Oral QHS  . rivaroxaban  20 mg Oral Q supper  . spironolactone  25 mg Oral Daily   Continuous Infusions: . sodium chloride 10 mL/hr at 07/21/13 2045   Active Problems:   Acute combined systolic and diastolic heart failure   Acute respiratory failure   Hypertensive emergency  Time spent: 67  Pamella Pert, MD [facility] Pager [phone]. If 7 PM - 7 AM, please contact night-coverage at [email or web address], password [facility] 07/23/2013, 1:00 PM  LOS: 2 days

## 2013-07-23 NOTE — Progress Notes (Signed)
Pt c/o headache offered 2 different options for pain control pt refused po meds wants it in the ' IV " RN giving report stated that MD had been called late day shift day before and declined IV pain meds for headache. Asked pt a second and third time - still refused po pain meds.

## 2013-07-23 NOTE — Progress Notes (Signed)
Echocardiogram 2D Echocardiogram has been performed.  Dawayne Ohair 07/23/2013, 3:32 PM

## 2013-07-23 NOTE — Progress Notes (Signed)
Asked again for pain medicine explained to pt that meds would not be IV decided yes to pain pills. When returned to room with narcotics pt said she would take 1 of the smallest ones. Given Oxy IR 5 mg and returned percocet since pt refused that one. Screaming and calling out constantly sitting with pt much of the time providing reassurance. Housekeeper and nurse tech providing company and reassurance to pt.

## 2013-07-23 NOTE — Progress Notes (Signed)
Patient evaluated for community based chronic disease management services with [facility] Care Management Program as a benefit of patient's [facility] Medicare Insurance.  Referred patient to [facility] RN [phone]).  [employer] will notify Clinical research associate and inpatient RNCM of service eligibility.  Of note, [facility] Care Management services does not replace or interfere with any services that are arranged by inpatient case management or social work.  For additional questions or referrals please contact Anibal Henderson BSN RN [facility] Liaison at [phone].

## 2013-07-24 ENCOUNTER — Encounter: Payer: Self-pay | Admitting: Internal Medicine

## 2013-07-24 LAB — BASIC METABOLIC PANEL
BUN: 27 mg/dL — ABNORMAL HIGH (ref 6–23)
Chloride: 101 mEq/L (ref 96–112)
GFR calc Af Amer: 51 mL/min — ABNORMAL LOW (ref >90)
GFR calc non Af Amer: 44 mL/min — ABNORMAL LOW (ref >90)
Potassium: 4.4 mEq/L (ref 3.5–5.1)
Sodium: 139 mEq/L (ref 135–145)

## 2013-07-24 LAB — CBC
HCT: 31.4 % — ABNORMAL LOW (ref 36.0–46.0)
Hemoglobin: 9.5 g/dL — ABNORMAL LOW (ref 12.0–15.0)
MCHC: 30.3 g/dL (ref 30.0–36.0)
Platelets: 271 10*3/uL (ref 150–400)
RBC: 3.83 MIL/uL — ABNORMAL LOW (ref 3.87–5.11)
WBC: 8 10*3/uL (ref 4.0–10.5)

## 2013-07-24 LAB — GLUCOSE, CAPILLARY
Glucose-Capillary: 103 mg/dL — ABNORMAL HIGH (ref 70–99)
Glucose-Capillary: 117 mg/dL — ABNORMAL HIGH (ref 70–99)
Glucose-Capillary: 140 mg/dL — ABNORMAL HIGH (ref 70–99)
Glucose-Capillary: 105 mg/dL — ABNORMAL HIGH (ref 70–99)

## 2013-07-24 MED ORDER — LISINOPRIL 2.5 MG PO TABS
2.5000 mg | ORAL_TABLET | Freq: Every day | ORAL | Status: DC
  Administered 2013-07-25: 2.5 mg via ORAL
  Filled 2013-07-24: qty 1

## 2013-07-24 MED ORDER — SODIUM CHLORIDE 0.9 % IV BOLUS (SEPSIS)
250.0000 mL | Freq: Once | INTRAVENOUS | Status: AC
  Administered 2013-07-24: 250 mL via INTRAVENOUS

## 2013-07-24 MED ORDER — LEVOTHYROXINE SODIUM 50 MCG PO TABS
50.0000 ug | ORAL_TABLET | Freq: Every day | ORAL | Status: DC
  Administered 2013-07-25: 06:00:00 50 ug via ORAL
  Filled 2013-07-24 (×2): qty 1

## 2013-07-24 MED ORDER — SODIUM CHLORIDE 0.9 % IJ SOLN
10.0000 mL | INTRAMUSCULAR | Status: DC | PRN
  Administered 2013-07-24: 09:00:00 30 mL
  Administered 2013-07-24 – 2013-07-25 (×2): 20 mL

## 2013-07-24 NOTE — Progress Notes (Signed)
Patient admitted to [facility] on 11.3.14 for respiratory distress.  She is currently active with [facility] services.  Last RN Care Coordinator home visit was on 10.20.14.  Patient's primary caregiver is her son Feliz Beam.  Patient has a Merchandiser, retail who visits daily for ongoing care to the patient for bath services and light duties in the home. States pt was getting supplies from [facility] home [facility] agency, a program through [employer] that provides daily aide services.  She has been able to ambulate with use of her specialized cane and pt slides down the stairs to get downstairs at times.  Son states she has been doing this for awhile with no problems.  Also discussed pt's history with CHF but no episode over the last few months. Son Feliz Beam) states he has some scales but currently out of the home.  Caregiver states pt seen monthly by Dr. Gala Romney (CHF) assistance Tammy Sours) who visits pt in her home for her ongoing CHF issues.  Patient will receive a post discharge transition of care call and will be evaluated for monthly home visits for assessments and disease process education.  Made Inpatient Case Manager aware that [facility] Care Management following. Of note, [facility] Care Management services does not replace or interfere with any services that are arranged by inpatient case management or social work.  For additional questions or referrals please contact Anibal Henderson BSN RN [facility] Liaison at [phone].

## 2013-07-24 NOTE — Progress Notes (Signed)
[facility] PROGRESS NOTE  Elizabeth Love AVW:[id number] DOB: 08/05/1956 DOA: 07/21/2013 PCP: Estill Cotta, MD  HPI: 57 yo with past medical history of nonischemic cardiomyopathy (EF 15% 04/11/13) brought to ED in respiratory distress. In ED noted to be hypertensive, placed on BiPAP and Nitroglycerin gtt. PCCM was consulted.  Assessment/Plan:  NICM with acute on chronic combined systolic and diastolic HF, EF 82%. Patient s/p ICD - renal function mildly worsened today, Cr 1.33. She is well diuresed, holding Lasix for now.  - hypotensive, give back 250 cc NS Acute hypoxic respiratory failure - due to hypertensive emergency, initially on BiPAP, resolved, now on room air.  HTN emergency - initially admitted to ICU, on nitro gtt.  - Lisinopril 10, Lasix 40, Spironolactone 25, Metoprolol 25 BID. Intermittently refusing medications. Controlled. Paroxysmal VT - on Amiodarone Prior CVA - no new focal deficits Headache - continue with when necessary Percocet. Most likely due to her migraines.  A. Fib - presently in sinus rhythm and rate controlled. On xarelto.  History of protein C deficiency: continue xarelto.  History of hypothyroidism - continue Synthroid.  Seizure: continue keppra.  Diet: Heart Fluids: none DVT Prophylaxis: Xarelto  Code Status: Full Family Communication: none  Disposition Plan: inpatient  Consultants:  Cardiology  PCCM  Procedures:  2D echo - patient refused.   Antibiotics  HPI/Subjective: - crying constantly, cannot elaborate why.  Objective: Filed Vitals:   07/23/13 2032 07/23/13 2125 07/24/13 0254 07/24/13 0459  BP: 94/53 112/70 92/50 89/56   Pulse: 63 70 69 72  Temp: 98 F (36.7 C)   98.2 F (36.8 C)  TempSrc: Oral   Oral  Resp: 16   18  Height:      Weight:      SpO2: 100%   99%    Intake/Output Summary (Last 24 hours) at 07/24/13 0939 Last data filed at 07/24/13 0936  Gross per 24 hour  Intake    870 ml  Output    1225 ml  Net   -355 ml   Filed Weights   07/22/13 0459 07/22/13 1729 07/23/13 0500  Weight: 49.5 kg (109 lb 2 oz) 50.44 kg (111 lb 3.2 oz) 50.4 kg (111 lb 1.8 oz)    Exam:   General:  Very emotional  Cardiovascular: regular rate and rhythm, without MRG  Respiratory: patient crying could not assess  Abdomen: soft, not tender to palpation  MSK: no peripheral edema  Neuro: non focal  Data Reviewed: Basic Metabolic Panel:  Recent Labs Lab 07/21/13 0902 07/21/13 1215 07/22/13 0434 07/23/13 0409 07/24/13 0508  NA 140  --  137 137 139  K 4.1  --  3.2* 3.8 4.4  CL 104  --  101 100 101  CO2 19  --  26 25 28   GLUCOSE 221*  --  93 127* 123*  BUN 16  --  16 23 27*  CREATININE 1.15*  --  0.99 1.24* 1.33*  CALCIUM 9.5  --  9.0 9.2 9.5  MG  --  1.7 2.2  --   --   PHOS  --  3.8 4.2  --   --    Liver Function Tests:  Recent Labs Lab 07/21/13 0902 07/21/13 2123  AST 29 26  ALT 16 14  ALKPHOS 80 70  BILITOT 0.9 1.1  PROT 8.3 7.3  ALBUMIN 3.4* 3.1*    Recent Labs Lab 07/21/13 0902 07/21/13 1215 07/21/13 2123  LIPASE 15  --  11  AMYLASE  --  64 61   CBC:  Recent Labs Lab 07/21/13 0902 07/21/13 2123 07/22/13 0434 07/24/13 0508  WBC 11.0* 8.2 7.3 8.0  NEUTROABS 7.4 5.7  --   --   HGB 9.2* 8.5* 8.7* 9.5*  HCT 30.2* 27.9* 27.8* 31.4*  MCV 82.1 80.9 80.8 82.0  PLT 253 211 209 271   Cardiac Enzymes:  Recent Labs Lab 07/21/13 0902 07/21/13 1548 07/21/13 2123  TROPONINI <0.30 <0.30 <0.30   BNP (last 3 results)  Recent Labs  07/21/13 0902  PROBNP 9726.0*   CBG:  Recent Labs Lab 07/23/13 0615 07/23/13 1120 07/23/13 1559 07/23/13 2132 07/24/13 0645  GLUCAP 143* 161* 150* 103* 117*   Recent Results (from the past 240 hour(s))  MRSA PCR SCREENING     Status: None   Collection Time    07/21/13  1:03 PM      Result Value Range Status   MRSA by PCR NEGATIVE  NEGATIVE Final   Comment:            The GeneXpert MRSA Assay (FDA     approved  for NASAL specimens     only), is one component of a     comprehensive MRSA colonization     surveillance program. It is not     intended to diagnose MRSA     infection nor to guide or     monitor treatment for     MRSA infections.    Studies: No results found.  Scheduled Meds: . amiodarone  200 mg Oral Daily  . insulin aspart  0-15 Units Subcutaneous TID WC  . insulin aspart  0-5 Units Subcutaneous QHS  . insulin aspart  3 Units Subcutaneous TID WC  . levETIRAcetam  1,000 mg Oral BID  . levothyroxine  50 mcg Oral QAC breakfast  . lisinopril  10 mg Oral Daily  . metoprolol tartrate  25 mg Oral BID  . QUEtiapine  150 mg Oral QHS  . rivaroxaban  20 mg Oral Q supper  . spironolactone  25 mg Oral Daily   Continuous Infusions: . sodium chloride 20 mL/hr at 07/24/13 0841   Active Problems:   Acute combined systolic and diastolic heart failure   Acute respiratory failure   Hypertensive emergency  Time spent: 25  Pamella Pert, MD [facility] Pager [phone]. If 7 PM - 7 AM, please contact night-coverage at [email or web address], password [facility] 07/24/2013, 9:39 AM  LOS: 3 days

## 2013-07-24 NOTE — Progress Notes (Signed)
Pt complaining of headache. Pt crying and yelling out in pain. RN got patient pain medication out of pyxis. RN entered pt room with pain medication and other 10:00am medications. Pt refused all medications and is currently sleeping. Will continue to monitor.   Genevive Bi, RN

## 2013-07-24 NOTE — Plan of Care (Signed)
Problem: Phase I Progression Outcomes Goal: EF % per last Echo/documented,Core Reminder form on chart Outcome: Completed/Met Date Met:  07/24/13 20%(07-22-13)

## 2013-07-24 NOTE — Progress Notes (Signed)
Was asked by nutrition if it was ok to give pt chicken pie and blueberry cobbler for supper, since this meal would put her over her daily carb allowance.  Pt did not eat lunch and has not been eating well, therefore they were told it was ok.  Will continue to monitor.

## 2013-07-25 LAB — BASIC METABOLIC PANEL
BUN: 19 mg/dL (ref 6–23)
Calcium: 9.4 mg/dL (ref 8.4–10.5)
Chloride: 103 mEq/L (ref 96–112)
Creatinine, Ser: 1.02 mg/dL (ref 0.50–1.10)
GFR calc Af Amer: 70 mL/min — ABNORMAL LOW (ref >90)

## 2013-07-25 LAB — GLUCOSE, CAPILLARY

## 2013-07-25 MED ORDER — FUROSEMIDE 40 MG PO TABS
40.0000 mg | ORAL_TABLET | Freq: Two times a day (BID) | ORAL | Status: DC
  Administered 2013-07-25: 40 mg via ORAL
  Filled 2013-07-25 (×3): qty 1

## 2013-07-25 NOTE — Progress Notes (Signed)
Patient noncompliant and continues to try to get OOB without assistance. Bed alarm and camera in use.  Patient also refusing to take all medications, except for PRN Oxy IR.  Patient also refusing to have CBG checked.  Will continue to monitor. Troy Sine

## 2013-07-25 NOTE — Progress Notes (Signed)
ANTICOAGULATION CONSULT NOTE - Initial Consult  Pharmacy Consult for rivaroxaban  Indication: DVT, protein C def  Allergies  Allergen Reactions  . Penicillins   . Sulfa Antibiotics     Patient Measurements:  Vital Signs: Temp: 98 F (36.7 C) (11/07 0557) Temp src: Oral (11/07 0557) BP: 105/70 mmHg (11/07 0934) Pulse Rate: 76 (11/07 0934)  Labs:  Recent Labs  07/23/13 0409 07/24/13 0508 07/25/13 0500  HGB  --  9.5*  --   HCT  --  31.4*  --   PLT  --  271  --   CREATININE 1.24* 1.33* 1.02    Estimated Creatinine Clearance: 42.5 ml/min (by C-G formula based on Cr of 1.02).  Assessment:  57 YOF on Xarelto for a diagnosis of DVT. She has been on it since oct 2013, She is here for resp distress and HTN urgency, cont xarelto while here. Scr improved 1.33 >1.02, est. crcl ~ 40 ml/min, LFTs wnl, Hgb 9.5, plt 271. Plan for discharge today   Goal of Therapy:  Appropriate anticoagulation Monitor platelets by anticoagulation protocol: Yes   Plan:  Continue Xarelto 20mg  PO qday  Bayard Hugger, PharmD, BCPS  Clinical Pharmacist  Pager: [phone]   07/25/2013,11:00 AM

## 2013-07-27 NOTE — Discharge Summary (Signed)
Physician Discharge Summary  Elizabeth Love:[id number] DOB: August 20, 1956 DOA: 07/21/2013  PCP: Estill Cotta, MD  Admit date: 07/21/2013 Discharge date: 07/27/2013  Time spent: 35 minutes  Recommendations for Outpatient Follow-up:  1. Follow up with PCP in 1 week 2. Follow up in heart failure clinic in 1 week   Discharge Diagnoses:  Active Problems:   Acute combined systolic and diastolic heart failure   Acute respiratory failure   Hypertensive emergency  Discharge Condition: stable  Diet recommendation: low sodium heart healthy fluid restricted  Filed Weights   07/22/13 1729 07/23/13 0500 07/25/13 0557  Weight: 50.44 kg (111 lb 3.2 oz) 50.4 kg (111 lb 1.8 oz) 51.665 kg (113 lb 14.4 oz)    History of present illness:  57 yo with past medical history of nonischemic cardiomyopathy (EF 15% 04/11/13) brought to ED in respiratory distress. In ED noted to be hypertensive, placed on BiPAP and Nitroglycerin gtt. PCCM was consulted.  Hospital Course:  NICM with acute on chronic combined systolic and diastolic HF, EF 81%. Patient s/p ICD. Patient has received IV diuresis with good urine output and weight on discharge was 113 lb from 121 in admission. Renal function stable on discharge. On room air on discharge. Acute hypoxic respiratory failure - due to hypertensive emergency, initially on BiPAP, resolved, now on room air.  HTN emergency - initially admitted to ICU, on nitro gtt.  - Lisinopril 10, Lasix 40, Spironolactone 25, Metoprolol 25 BID restarted. Intermittently refusing medications while hospitalized. Her BP showed good control as long as she is taking her medications. BP 108/74 on discharge.   Paroxysmal VT - on Amiodarone  Prior CVA - no new focal deficits  Headache - continue with when necessary Percocet. Most likely due to her migraines.  A. Fib - presently in sinus rhythm and rate controlled. On xarelto.  History of protein C deficiency: continue xarelto.  History  of hypothyroidism - continue Synthroid.  Seizure: continue keppra.  Procedures:  2D echo  Study Conclusions  - Left ventricle: The cavity size was severely dilated. Wall thickness was increased in a pattern of mild LVH. The estimated ejection fraction was 20%. Diffuse hypokinesis. Doppler parameters are consistent with high ventricular filling pressure. - Mitral valve: Calcified annulus. Moderate regurgitation. - Left atrium: The atrium was moderately to severely dilated. - Right atrium: The atrium was mildly dilated. - Tricuspid valve: Moderate regurgitation. - Pulmonary arteries: PA peak pressure: 47mm Hg (S). - Pericardium, extracardiac: A trivial pericardial effusion was identified posterior to the heart.   Consultations:  Cardiology  Discharge Exam: Filed Vitals:   07/24/13 2259 07/25/13 0557 07/25/13 0934 07/25/13 1437  BP: 94/61 112/69 105/70 108/74  Pulse: 81 71 76 74  Temp: 98.4 F (36.9 C) 98 F (36.7 C)  98.5 F (36.9 C)  TempSrc: Oral Oral  Oral  Resp: 18 16  18   Height:      Weight:  51.665 kg (113 lb 14.4 oz)    SpO2: 97% 100%  100%   General: NAD Cardiovascular: RRR Respiratory: CTA biL  Discharge Instructions   Future Appointments Provider Department Dept Phone   08/05/2013 9:40 AM [facility] [phone]       Medication List         ALPRAZolam 0.5 MG tablet  Commonly known as:  XANAX  Take 0.5 mg by mouth at bedtime as needed for sleep.     amiodarone 400 MG tablet  Commonly known as:  PACERONE  Take 200 mg by mouth See admin instructions. Takes 200mg  on Mon, Tues, Wed, Thurs and Fri's only (none on weekends)     escitalopram 20 MG tablet  Commonly known as:  LEXAPRO  Take 20 mg by mouth daily.     furosemide 40 MG tablet  Commonly known as:  LASIX  Take 40 mg by mouth 2 (two) times daily.     hydrOXYzine 50 MG tablet  Commonly known as:  ATARAX/VISTARIL  Take 50 mg by  mouth 3 (three) times daily as needed for anxiety.     insulin lispro 100 UNIT/ML injection  Commonly known as:  HUMALOG  Inject into the skin 3 (three) times daily before meals. On sliding scale     levETIRAcetam 1000 MG tablet  Commonly known as:  KEPPRA  Take 2,000 mg by mouth 2 (two) times daily.     levothyroxine 50 MCG tablet  Commonly known as:  SYNTHROID, LEVOTHROID  Take 50 mcg by mouth daily before breakfast.     lisinopril 5 MG tablet  Commonly known as:  PRINIVIL,ZESTRIL  Take 5 mg by mouth daily.     metoprolol tartrate 25 MG tablet  Commonly known as:  LOPRESSOR  Take 25 mg by mouth 2 (two) times daily.     nystatin cream  Commonly known as:  MYCOSTATIN  Apply 1 application topically 2 (two) times daily.     omeprazole 20 MG capsule  Commonly known as:  PRILOSEC  Take 20 mg by mouth 2 (two) times daily.     oxyCODONE-acetaminophen 10-325 MG per tablet  Commonly known as:  PERCOCET  Take 1 tablet by mouth every 8 (eight) hours as needed for pain.     potassium chloride SA 20 MEQ tablet  Commonly known as:  K-DUR,KLOR-CON  Take 20 mEq by mouth daily.     SEROQUEL XR 150 MG 24 hr tablet  Generic drug:  QUEtiapine Fumarate  Take 150 mg by mouth at bedtime.     spironolactone 25 MG tablet  Commonly known as:  ALDACTONE  Take 25 mg by mouth daily.     terazosin 2 MG capsule  Commonly known as:  HYTRIN  Take 2 mg by mouth at bedtime.     Vitamin D (Ergocalciferol) 50000 UNITS Caps capsule  Commonly known as:  DRISDOL  Take 50,000 Units by mouth every 30 (thirty) days.     XARELTO 20 MG Tabs tablet  Generic drug:  Rivaroxaban  Take 20 mg by mouth daily.           Follow-up Information   Follow up with [name], [name], MD On 07/29/2013. (@ 11:15 AM with Dr. Peggyann Juba)    Specialty:  Internal Medicine   Contact information:   [address] [place] [address] [phone]       Follow up with [name], MD On  08/05/2013. (@ 9:40 AM)    Specialty:  Cardiology   Contact information:   [address] [place] [address] [phone]       Follow up with Arvilla Meres, MD On 07/31/2013. (@ 9:30 AM )    Specialty:  Cardiology   Contact information:   [address] [place] [address] [phone]       The results of significant diagnostics from this hospitalization (including imaging, microbiology, ancillary and laboratory) are listed below for reference.    Significant Diagnostic Studies:  Dg Chest Port 1 View  07/21/2013   CLINICAL DATA:  Right IJ central line placement.  EXAM: PORTABLE CHEST - 1 VIEW  COMPARISON:  07/21/2013 at 8:57 a.m.  FINDINGS: There has been interval placement of a right IJ central venous catheter with tip at the origin of the cavoatrial junction. Left-sided pacemaker is unchanged. There has been improvement in very mild hazy bilateral perihilar opacification likely improving interstitial edema. Continued hazy density over the left base which may be due to combination atelectasis/effusion. Stable moderate cardiomegaly. No evidence of right-sided pneumothorax. Remainder of the exam is unchanged.  IMPRESSION: Interval improvement of minimal interstitial edema. Persistent hazy left base opacification likely a combination of small effusions/atelectasis.  Stable moderate cardiomegaly.  Right IJ central venous catheter with tip at the origin of the cavoatrial junction. No pneumothorax.   Electronically Signed   By: Elberta Fortis M.D.   On: 07/21/2013 10:42   Dg Chest Portable 1 View  07/21/2013   CLINICAL DATA:  Shortness of breath. Respiratory distress.  EXAM: PORTABLE CHEST - 1 VIEW  COMPARISON:  None.  FINDINGS: Patient has left-sided AICD. Leads overlie the right atrium and right ventricle.  The heart is enlarged. There are patchy densities throughout the lungs bilaterally, consistent with interstitial infiltrates/edema. Work of opacity  at the left lung base may indicate atelectasis or infiltrate.  IMPRESSION: 1. Cardiomegaly and interstitial infiltrate/edema. 2. Left lower lobe infiltrate or atelectasis.   Electronically Signed   By: Rosalie Gums M.D.   On: 07/21/2013 09:06   Dg Abd Portable 1v  07/21/2013   CLINICAL DATA:  Abdominal pain and tenderness  EXAM: PORTABLE ABDOMEN - 1 VIEW  COMPARISON:  None.  FINDINGS: Normal bowel gas pattern. No evidence of obstruction.  There are surgical vascular clips in the right upper quadrant reflecting a prior cholecystectomy. A vena cava filter has its superior tip superimposed over the right lower aspect of L2. The soft tissues are otherwise unremarkable.  No acute bony abnormality.  IMPRESSION: No acute findings. No evidence of a bowel obstruction.   Electronically Signed   By: Amie Portland M.D.   On: 07/21/2013 21:47    Microbiology: Recent Results (from the past 240 hour(s))  MRSA PCR SCREENING     Status: None   Collection Time    07/21/13  1:03 PM      Result Value Range Status   MRSA by PCR NEGATIVE  NEGATIVE Final   Comment:            The GeneXpert MRSA Assay (FDA     approved for NASAL specimens     only), is one component of a     comprehensive MRSA colonization     surveillance program. It is not     intended to diagnose MRSA     infection nor to guide or     monitor treatment for     MRSA infections.     Labs: Basic Metabolic Panel:  Recent Labs Lab 07/21/13 0902 07/21/13 1215 07/22/13 0434 07/23/13 0409 07/24/13 0508 07/25/13 0500  NA 140  --  137 137 139 136  K 4.1  --  3.2* 3.8 4.4 4.6  CL 104  --  101 100 101 103  CO2 19  --  26 25 28 24   GLUCOSE 221*  --  93 127* 123* 118*  BUN 16  --  16 23 27* 19  CREATININE 1.15*  --  0.99 1.24* 1.33* 1.02  CALCIUM 9.5  --  9.0 9.2 9.5 9.4  MG  --  1.7 2.2  --   --   --   PHOS  --  3.8 4.2  --   --   --    Liver Function Tests:  Recent Labs Lab 07/21/13 0902 07/21/13 2123  AST 29 26  ALT 16 14   ALKPHOS 80 70  BILITOT 0.9 1.1  PROT 8.3 7.3  ALBUMIN 3.4* 3.1*    Recent Labs Lab 07/21/13 0902 07/21/13 1215 07/21/13 2123  LIPASE 15  --  11  AMYLASE  --  64 61   No results found for this basename: AMMONIA,  in the last 168 hours CBC:  Recent Labs Lab 07/21/13 0902 07/21/13 2123 07/22/13 0434 07/24/13 0508  WBC 11.0* 8.2 7.3 8.0  NEUTROABS 7.4 5.7  --   --   HGB 9.2* 8.5* 8.7* 9.5*  HCT 30.2* 27.9* 27.8* 31.4*  MCV 82.1 80.9 80.8 82.0  PLT 253 211 209 271   Cardiac Enzymes:  Recent Labs Lab 07/21/13 0902 07/21/13 1548 07/21/13 2123  TROPONINI <0.30 <0.30 <0.30   BNP: BNP (last 3 results)  Recent Labs  07/21/13 0902  PROBNP 9726.0*   CBG:  Recent Labs Lab 07/23/13 2132 07/24/13 0645 07/24/13 1102 07/24/13 1546 07/25/13 0605  GLUCAP 103* 117* 140* 105* 127*       Signed:  Kerianna Rawlinson  [facility] 07/27/2013, 1:55 PM

## 2013-07-28 ENCOUNTER — Encounter (HOSPITAL_COMMUNITY): Payer: Medicare Other

## 2013-07-28 ENCOUNTER — Encounter: Payer: Self-pay | Admitting: Nurse Practitioner

## 2013-07-29 ENCOUNTER — Ambulatory Visit (INDEPENDENT_AMBULATORY_CARE_PROVIDER_SITE_OTHER): Payer: Medicaid Other | Admitting: Family

## 2013-07-29 ENCOUNTER — Encounter: Payer: Self-pay | Admitting: Family

## 2013-07-29 VITALS — BP 98/58 | HR 62 | Temp 97.9°F | Resp 16 | Ht 62.0 in

## 2013-07-29 DIAGNOSIS — J9600 Acute respiratory failure, unspecified whether with hypoxia or hypercapnia: Secondary | ICD-10-CM

## 2013-07-29 DIAGNOSIS — G40909 Epilepsy, unspecified, not intractable, without status epilepticus: Secondary | ICD-10-CM

## 2013-07-29 DIAGNOSIS — E119 Type 2 diabetes mellitus without complications: Secondary | ICD-10-CM

## 2013-07-29 DIAGNOSIS — Z86718 Personal history of other venous thrombosis and embolism: Secondary | ICD-10-CM

## 2013-07-29 DIAGNOSIS — E039 Hypothyroidism, unspecified: Secondary | ICD-10-CM

## 2013-07-29 DIAGNOSIS — I5041 Acute combined systolic (congestive) and diastolic (congestive) heart failure: Secondary | ICD-10-CM

## 2013-07-29 DIAGNOSIS — E876 Hypokalemia: Secondary | ICD-10-CM

## 2013-07-29 DIAGNOSIS — R0609 Other forms of dyspnea: Secondary | ICD-10-CM

## 2013-07-29 DIAGNOSIS — R0600 Dyspnea, unspecified: Secondary | ICD-10-CM

## 2013-07-29 DIAGNOSIS — I6990 Unspecified sequelae of unspecified cerebrovascular disease: Secondary | ICD-10-CM

## 2013-07-29 DIAGNOSIS — I10 Essential (primary) hypertension: Secondary | ICD-10-CM

## 2013-07-29 DIAGNOSIS — I4891 Unspecified atrial fibrillation: Secondary | ICD-10-CM

## 2013-07-29 DIAGNOSIS — I6930 Unspecified sequelae of cerebral infarction: Secondary | ICD-10-CM

## 2013-07-29 NOTE — Assessment & Plan Note (Signed)
BP Readings from Last 3 Encounters:  07/29/13 98/58  07/25/13 108/74  07/19/13 116/69   BP is stable.  In fact a touch low after diuresis.  Pt asymptomatic. Will continue current meds.

## 2013-07-29 NOTE — Assessment & Plan Note (Signed)
2007 - R sided weakness and expressive aphasia with h/o behavioral problems related to this  Pt relies on family- especially brother for her care.

## 2013-07-29 NOTE — Assessment & Plan Note (Signed)
06/2012: RLE DVT, + protein C deficiency, placed on Xarelto. s/p IVC filter

## 2013-07-29 NOTE — Assessment & Plan Note (Signed)
Check follow up bmet.  

## 2013-07-29 NOTE — Progress Notes (Signed)
Subjective:    Patient ID: Elizabeth Love, female    DOB: 02/01/1956, 57 y.o.   MRN: [id number]  HPI  [name] is a 57 yr old female who presents today for hospital follow up.  Hospital records are reviewed.  She was hospitalized 11/3-11/9 with diagnoses of acute combined systolic and diastolic heart failure, acute respiratory failure and hypertensive urgency.  Her past medical hx is significant for nonischemic cardiomyopathy (LVEF 15 % 04/11/13).  She was in respiratory distress and hypertensive upon arrival to the ED and was placed on Bipap and nitro drip.  She has hx of ICD.  She was diuresed with IV diuretics, and lost 8 pounds during her hospitalization.  Discharge weight was 113.  She was mainteained on amiodarone due tho hx of paroxysmal VT, xarelto (due to hx of AF- though discharge summary notes that she was in AF at the time of her discharge.  Of noted she also has hx of protein C deficiency and is also continued on xarelto for this reason.  Follow up 2-D echo while hospitalized noted LVEF 20%.  Cardiology was consulted during her hospitalization.  She has a hospital follow up arranged with cardiology in 1 week.  She has been home now for 2 days.  She reports that she has mild shortness of breath since discharge but that her shortness of breath is at her baseline.  She declined to be weighed today but her husband reports that her weight yesterday was 116.  She reports at times she has some anterior chest wall pain.  She does have some tenderness of the chest wall at site of defibrillator- battery was changed 4 weeks ago.    Seizure disorder- reports + seizure 2 weeks ago. She reports that she is followed by Neurology.  Brother will call us with the name of her neurology.  She is maintained on Keppra.    She lives with her brother and her mother. Ambulates with a 4 pound walker.  Scoots down the stairs on her rear.  Able to walk up stairs with assistance.     Review of Systems    see  HPI  Past Medical History  Diagnosis Date  . Seizures   . Hypertension   . Stroke     2007 - R sided weakness and expressive aphasia with h/o behavioral problems related to this  . Asthma   . Diabetes mellitus   . 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 Chronic systolic CHF (congestive heart failure)   . Atrial fibrillation     a. On Xarelto after having DVT (prev felt to be poor anticoag cand 2/2 noncompliance).  . History of DVT (deep vein thrombosis)     a. 06/2012: RLE DVT, + protein C deficiency, placed on Xarelto. s/p IVC filter  . Noncompliance     Due to mental status, family has had to coax her to take medicines  . Hypothyroidism   . VT (ventricular tachycardia)     a. h/o ICD ~2007-2008. b. Adm 08/2012 with UTI, had VT/ICD shock x 5 (hypokalemic);  c. 06/2013 s/p ICD Gen change: SJM DC ICD ser #: [id number]  . Protein C deficiency   . NICM (nonischemic cardiomyopathy)     a. s/p AICD ~2007 ([employer]) - previous care [facility]. No known hx CAD. b. EF 10% by echo 04/2012;  c. 06/2013 s/p ICD Gen change: SJM DC ICD ser #: [id number]    History  Social History  . Marital Status: Single    Spouse Name: [name]    Number of Children: [name]  . Years of Education: [name]   Occupational History  . Not on file.   Social History Main Topics  . Smoking status: Former Games developer  . Smokeless tobacco: Never Used  . Alcohol Use: No  . Drug Use: No  . Sexual Activity: Not on file   Other Topics Concern  . Not on file   Social History Narrative   ** Merged History Encounter **        Past Surgical History  Procedure Laterality Date  . Cholecystectomy    . Appendectomy    . Pacemaker insertion    . Vena cava filter placement  07/19/2012    Procedure: INSERTION VENA-CAVA FILTER;  Surgeon: Larina Earthly, MD;  [facility];  Service: Vascular;  Laterality: [name];  . Insert / replace / remove pacemaker      ICD  . Abdominal hysterectomy      Family History  Problem Relation Age  of Onset  . Hypertension Mother   . Coronary artery disease Neg Hx     Allergies  Allergen Reactions  . Penicillins   . Sulfa Antibiotics   . Penicillins Rash  . Sulfa Antibiotics Rash    Current Outpatient Prescriptions on File Prior to Visit  Medication Sig Dispense Refill  . acetaminophen (TYLENOL) 325 MG tablet Take 650 mg by mouth every 6 (six) hours as needed for pain.      Marland Kitchen albuterol (PROVENTIL) (2.5 MG/3ML) 0.083% nebulizer solution Take 2.5 mg by nebulization every 6 (six) hours as needed. For cough or wheeze      . ALPRAZolam (XANAX) 0.5 MG tablet Take 0.5 mg by mouth daily as needed for sleep or anxiety. For sleep or anxiety      . ALPRAZolam (XANAX) 0.5 MG tablet Take 0.5 mg by mouth at bedtime as needed for sleep.      Marland Kitchen amiodarone (PACERONE) 400 MG tablet Take 200 mg by mouth See admin instructions. Takes 200mg  on Mon, Tues, Wed, Thurs and Fri's only (none on weekends)      . digoxin (LANOXIN) 0.125 MG tablet Take 1 tablet (0.125 mg total) by mouth daily.  30 tablet  0  . escitalopram (LEXAPRO) 20 MG tablet Take 20 mg by mouth daily.      . furosemide (LASIX) 40 MG tablet TAKE 1 TABLET BY MOUTH TWICE DAILY  60 tablet  5  . furosemide (LASIX) 40 MG tablet Take 40 mg by mouth 2 (two) times daily.      . hydrOXYzine (ATARAX/VISTARIL) 50 MG tablet Take 50 mg by mouth 3 (three) times daily as needed for anxiety.       . insulin lispro (HUMALOG) 100 UNIT/ML injection Inject into the skin 3 (three) times daily before meals. On sliding scale      . levETIRAcetam (KEPPRA) 1000 MG tablet Take 2,000 mg by mouth 2 (two) times daily.      Marland Kitchen levETIRAcetam (KEPPRA) 500 MG tablet Take 1,000 mg by mouth 2 (two) times daily.      Marland Kitchen levothyroxine (SYNTHROID, LEVOTHROID) 50 MCG tablet Take 50 mcg by mouth daily.      Marland Kitchen levothyroxine (SYNTHROID, LEVOTHROID) 50 MCG tablet Take 50 mcg by mouth daily before breakfast.      . lisinopril (PRINIVIL,ZESTRIL) 10 MG tablet Take 1 tablet (10 mg total)  by mouth daily.  30 tablet  3  .  lisinopril (PRINIVIL,ZESTRIL) 5 MG tablet Take 5 mg by mouth daily.      . metolazone (ZAROXOLYN) 2.5 MG tablet Take 2.5 mg by mouth daily as needed. For fluid retention      . metoprolol succinate (TOPROL-XL) 25 MG 24 hr tablet Take 25 mg by mouth daily.      . metoprolol tartrate (LOPRESSOR) 25 MG tablet Take 25 mg by mouth 2 (two) times daily.      Marland Kitchen nystatin cream (MYCOSTATIN) Apply 1 application topically 2 (two) times daily.      Marland Kitchen omeprazole (PRILOSEC) 20 MG capsule Take 20 mg by mouth 2 (two) times daily.      Marland Kitchen omeprazole (PRILOSEC) 40 MG capsule Take 1 capsule (40 mg total) by mouth 2 (two) times daily.  60 capsule  1  . oxyCODONE-acetaminophen (PERCOCET) 10-325 MG per tablet Take 1 tablet by mouth every 6 (six) hours as needed for pain. Scheduled per pharmacy  30 tablet  0  . oxyCODONE-acetaminophen (PERCOCET) 10-325 MG per tablet Take 1 tablet by mouth every 8 (eight) hours as needed for pain.      . polyethylene glycol (MIRALAX / GLYCOLAX) packet Take 17 g by mouth daily as needed. For constipation      . potassium chloride SA (K-DUR,KLOR-CON) 20 MEQ tablet Take 20 mEq by mouth 2 (two) times daily.      . potassium chloride SA (K-DUR,KLOR-CON) 20 MEQ tablet Take 20 mEq by mouth daily.      . QUEtiapine Fumarate (SEROQUEL XR) 150 MG 24 hr tablet Take 150 mg by mouth at bedtime.      . Rivaroxaban (XARELTO) 20 MG TABS tablet Take 20 mg by mouth daily.      . Rivaroxaban (XARELTO) 20 MG TABS Take 1 tablet (20 mg total) by mouth daily with supper.  30 tablet  6  . spironolactone (ALDACTONE) 25 MG tablet Take 1 tablet (25 mg total) by mouth daily.  30 tablet  3  . spironolactone (ALDACTONE) 25 MG tablet Take 25 mg by mouth daily.      Marland Kitchen terazosin (HYTRIN) 2 MG capsule Take 2 mg by mouth at bedtime.      . Vitamin D, Ergocalciferol, (DRISDOL) 50000 UNITS CAPS capsule Take 50,000 Units by mouth every 30 (thirty) days.       No current facility-administered  medications on file prior to visit.    BP 98/58  Pulse 62  Temp(Src) 97.9 F (36.6 C) (Oral)  Resp 16  Ht 5\' 2"  (1.575 m)  SpO2 99%    Objective:   Physical Exam  Constitutional:  Thin AA female seated in wheel chair. NAD  HENT:  Head: Normocephalic and atraumatic.  Eyes: No scleral icterus.  Neck: Neck supple.  Cardiovascular: Normal rate and regular rhythm.   No murmur heard. Pulmonary/Chest: Effort normal and breath sounds normal. No respiratory distress. She has no wheezes. She has no rales. She exhibits no tenderness.  Musculoskeletal: She exhibits no edema.  Neurological: She is alert.  R sided hemiparesis.. Pt seems to have some level of mental deficit from stroke  Skin: Skin is warm and dry.  Psychiatric: She has a normal mood and affect. Her behavior is normal. Judgment and thought content normal.          Assessment & Plan:

## 2013-07-29 NOTE — Assessment & Plan Note (Signed)
Lab Results  Component Value Date   HGBA1C 6.8* 04/11/2013   Plan A1C next visit.  She is maintained on humalog.

## 2013-07-29 NOTE — Assessment & Plan Note (Signed)
Clinically euvolemic today.  Continue lasix, zaroxyln, aldactone. Brother is instructed to weigh pt daily and to call if her weight is above 118.  He verbalizes understanding.

## 2013-07-29 NOTE — Assessment & Plan Note (Signed)
Had seizure about 2 weeks ago.  I have asked the pt to call us with name of her neurologist and to arrange a follow up appointment with neurology. He verbalizes understanding. Continue keppra.

## 2013-07-29 NOTE — Patient Instructions (Signed)
Please complete lab work prior to leaving. Schedule follow up with your neurologist. Keep upcoming appointment with cardiology. Follow up with [place] in 2 months.

## 2013-07-29 NOTE — Assessment & Plan Note (Signed)
Lab Results  Component Value Date   TSH 1.706 07/21/2013   TSH stable on current dose of synthroid.

## 2013-07-29 NOTE — Assessment & Plan Note (Signed)
Resolved. Monitor.  

## 2013-07-29 NOTE — Assessment & Plan Note (Addendum)
Rate stable.  On dig/amio.  Defer management to cardiology.

## 2013-08-04 ENCOUNTER — Inpatient Hospital Stay (HOSPITAL_COMMUNITY)
Admission: EM | Admit: 2013-08-04 | Discharge: 2013-08-06 | DRG: 313 | Disposition: A | Discharge status: Home Health | Payer: Medicare Other | Attending: Internal Medicine | Admitting: Internal Medicine

## 2013-08-04 ENCOUNTER — Emergency Department (HOSPITAL_COMMUNITY): Payer: Medicare Other

## 2013-08-04 ENCOUNTER — Encounter (HOSPITAL_COMMUNITY): Payer: Self-pay | Admitting: Emergency Medicine

## 2013-08-04 DIAGNOSIS — R079 Chest pain, unspecified: Secondary | ICD-10-CM

## 2013-08-04 DIAGNOSIS — E039 Hypothyroidism, unspecified: Secondary | ICD-10-CM | POA: Diagnosis present

## 2013-08-04 DIAGNOSIS — I428 Other cardiomyopathies: Secondary | ICD-10-CM | POA: Diagnosis present

## 2013-08-04 DIAGNOSIS — E119 Type 2 diabetes mellitus without complications: Secondary | ICD-10-CM | POA: Diagnosis present

## 2013-08-04 DIAGNOSIS — G8929 Other chronic pain: Secondary | ICD-10-CM | POA: Diagnosis present

## 2013-08-04 DIAGNOSIS — R0789 Other chest pain: Principal | ICD-10-CM | POA: Diagnosis present

## 2013-08-04 DIAGNOSIS — I6930 Unspecified sequelae of cerebral infarction: Secondary | ICD-10-CM

## 2013-08-04 DIAGNOSIS — I5041 Acute combined systolic (congestive) and diastolic (congestive) heart failure: Secondary | ICD-10-CM | POA: Diagnosis present

## 2013-08-04 DIAGNOSIS — I69320 Aphasia following cerebral infarction: Secondary | ICD-10-CM

## 2013-08-04 DIAGNOSIS — I69920 Aphasia following unspecified cerebrovascular disease: Secondary | ICD-10-CM

## 2013-08-04 DIAGNOSIS — Z86718 Personal history of other venous thrombosis and embolism: Secondary | ICD-10-CM

## 2013-08-04 DIAGNOSIS — Z8249 Family history of ischemic heart disease and other diseases of the circulatory system: Secondary | ICD-10-CM

## 2013-08-04 DIAGNOSIS — I5022 Chronic systolic (congestive) heart failure: Secondary | ICD-10-CM

## 2013-08-04 DIAGNOSIS — D6859 Other primary thrombophilia: Secondary | ICD-10-CM | POA: Diagnosis present

## 2013-08-04 DIAGNOSIS — Z91199 Patient's noncompliance with other medical treatment and regimen due to unspecified reason: Secondary | ICD-10-CM

## 2013-08-04 DIAGNOSIS — Z87891 Personal history of nicotine dependence: Secondary | ICD-10-CM

## 2013-08-04 DIAGNOSIS — Z9119 Patient's noncompliance with other medical treatment and regimen: Secondary | ICD-10-CM

## 2013-08-04 DIAGNOSIS — F3289 Other specified depressive episodes: Secondary | ICD-10-CM | POA: Diagnosis present

## 2013-08-04 DIAGNOSIS — J45909 Unspecified asthma, uncomplicated: Secondary | ICD-10-CM | POA: Diagnosis present

## 2013-08-04 DIAGNOSIS — Z95810 Presence of automatic (implantable) cardiac defibrillator: Secondary | ICD-10-CM

## 2013-08-04 DIAGNOSIS — G40909 Epilepsy, unspecified, not intractable, without status epilepticus: Secondary | ICD-10-CM | POA: Diagnosis present

## 2013-08-04 DIAGNOSIS — I509 Heart failure, unspecified: Secondary | ICD-10-CM

## 2013-08-04 DIAGNOSIS — I10 Essential (primary) hypertension: Secondary | ICD-10-CM

## 2013-08-04 DIAGNOSIS — I2510 Atherosclerotic heart disease of native coronary artery without angina pectoris: Secondary | ICD-10-CM | POA: Diagnosis present

## 2013-08-04 DIAGNOSIS — Z950 Presence of cardiac pacemaker: Secondary | ICD-10-CM

## 2013-08-04 DIAGNOSIS — F329 Major depressive disorder, single episode, unspecified: Secondary | ICD-10-CM | POA: Diagnosis present

## 2013-08-04 DIAGNOSIS — IMO0001 Reserved for inherently not codable concepts without codable children: Secondary | ICD-10-CM | POA: Diagnosis present

## 2013-08-04 DIAGNOSIS — I4891 Unspecified atrial fibrillation: Secondary | ICD-10-CM

## 2013-08-04 LAB — TROPONIN I: Troponin I: <0.3 ng/mL (ref <0.30)

## 2013-08-04 LAB — CBC WITH DIFFERENTIAL/PLATELET
Basophils Relative: 0 % (ref 0–1)
Eosinophils Absolute: 0.2 10*3/uL (ref 0.0–0.7)
Eosinophils Relative: 3 % (ref 0–5)
HCT: 31.7 % — ABNORMAL LOW (ref 36.0–46.0)
Hemoglobin: 10 g/dL — ABNORMAL LOW (ref 12.0–15.0)
Lymphs Abs: 2.1 10*3/uL (ref 0.7–4.0)
MCH: 25.3 pg — ABNORMAL LOW (ref 26.0–34.0)
MCHC: 31.5 g/dL (ref 30.0–36.0)
MCV: 80.3 fL (ref 78.0–100.0)
Monocytes Absolute: 0.7 10*3/uL (ref 0.1–1.0)
Monocytes Relative: 10 % (ref 3–12)
Neutro Abs: 3.9 10*3/uL (ref 1.7–7.7)
RBC: 3.95 MIL/uL (ref 3.87–5.11)

## 2013-08-04 LAB — GLUCOSE, CAPILLARY: Glucose-Capillary: 121 mg/dL — ABNORMAL HIGH (ref 70–99)

## 2013-08-04 LAB — BASIC METABOLIC PANEL
BUN: 23 mg/dL (ref 6–23)
CO2: 22 mEq/L (ref 19–32)
Chloride: 102 mEq/L (ref 96–112)
GFR calc Af Amer: 69 mL/min — ABNORMAL LOW (ref >90)
Glucose, Bld: 134 mg/dL — ABNORMAL HIGH (ref 70–99)
Potassium: 4.4 mEq/L (ref 3.5–5.1)
Sodium: 136 mEq/L (ref 135–145)

## 2013-08-04 LAB — PRO B NATRIURETIC PEPTIDE: Pro B Natriuretic peptide (BNP): 4130 pg/mL — ABNORMAL HIGH (ref 0–125)

## 2013-08-04 LAB — C-REACTIVE PROTEIN: CRP: <0.5 mg/dL — ABNORMAL LOW (ref <0.60)

## 2013-08-04 LAB — SEDIMENTATION RATE: Sed Rate: 20 mm/hr (ref 0–22)

## 2013-08-04 LAB — DIGOXIN LEVEL: Digoxin Level: <0.3 ng/mL — ABNORMAL LOW (ref 0.8–2.0)

## 2013-08-04 MED ORDER — ALPRAZOLAM 0.5 MG PO TABS
0.5000 mg | ORAL_TABLET | Freq: Every evening | ORAL | Status: DC | PRN
  Administered 2013-08-04 – 2013-08-06 (×2): 0.5 mg via ORAL
  Filled 2013-08-04 (×2): qty 1

## 2013-08-04 MED ORDER — QUETIAPINE FUMARATE ER 50 MG PO TB24
150.0000 mg | ORAL_TABLET | Freq: Every day | ORAL | Status: DC
Start: 2013-08-04 — End: 2013-08-06
  Filled 2013-08-04 (×3): qty 3

## 2013-08-04 MED ORDER — INSULIN ASPART 100 UNIT/ML ~~LOC~~ SOLN
0.0000 [IU] | Freq: Three times a day (TID) | SUBCUTANEOUS | Status: DC
  Administered 2013-08-05: 12:00:00 2 [IU] via SUBCUTANEOUS
  Administered 2013-08-06: 3 [IU] via SUBCUTANEOUS

## 2013-08-04 MED ORDER — FUROSEMIDE 40 MG PO TABS
40.0000 mg | ORAL_TABLET | Freq: Two times a day (BID) | ORAL | Status: DC
  Administered 2013-08-05: 40 mg via ORAL
  Filled 2013-08-04 (×4): qty 1

## 2013-08-04 MED ORDER — TERAZOSIN HCL 2 MG PO CAPS
2.0000 mg | ORAL_CAPSULE | Freq: Every day | ORAL | Status: DC
  Administered 2013-08-04: 22:00:00 2 mg via ORAL
  Filled 2013-08-04 (×2): qty 1

## 2013-08-04 MED ORDER — ONDANSETRON HCL 4 MG/2ML IJ SOLN: 4.0000 mg | Freq: Four times a day (QID) | INTRAMUSCULAR | Status: DC | PRN

## 2013-08-04 MED ORDER — OXYCODONE-ACETAMINOPHEN 10-325 MG PO TABS: 1.0000 | ORAL_TABLET | Freq: Three times a day (TID) | ORAL | Status: DC | PRN

## 2013-08-04 MED ORDER — LEVETIRACETAM 750 MG PO TABS
2000.0000 mg | ORAL_TABLET | Freq: Two times a day (BID) | ORAL | Status: DC
  Administered 2013-08-05 – 2013-08-06 (×2): 2000 mg via ORAL
  Filled 2013-08-04 (×6): qty 1

## 2013-08-04 MED ORDER — MORPHINE SULFATE 2 MG/ML IJ SOLN
1.0000 mg | INTRAMUSCULAR | Status: DC | PRN
  Filled 2013-08-04: qty 1

## 2013-08-04 MED ORDER — OXYCODONE-ACETAMINOPHEN 5-325 MG PO TABS
1.0000 | ORAL_TABLET | Freq: Three times a day (TID) | ORAL | Status: DC | PRN
  Administered 2013-08-05: 1 via ORAL
  Filled 2013-08-04: qty 1

## 2013-08-04 MED ORDER — DIGOXIN 125 MCG PO TABS
0.1250 mg | ORAL_TABLET | Freq: Every day | ORAL | Status: DC
  Administered 2013-08-04 – 2013-08-06 (×3): 0.125 mg via ORAL
  Filled 2013-08-04 (×3): qty 1

## 2013-08-04 MED ORDER — FUROSEMIDE 10 MG/ML IJ SOLN
20.0000 mg | Freq: Once | INTRAMUSCULAR | Status: AC
  Administered 2013-08-04: 20 mg via INTRAVENOUS
  Filled 2013-08-04: qty 2

## 2013-08-04 MED ORDER — OXYCODONE HCL 5 MG PO TABS: 5.0000 mg | ORAL_TABLET | Freq: Three times a day (TID) | ORAL | Status: DC | PRN

## 2013-08-04 MED ORDER — ESCITALOPRAM OXALATE 20 MG PO TABS
20.0000 mg | ORAL_TABLET | Freq: Every day | ORAL | Status: DC
  Administered 2013-08-05 – 2013-08-06 (×2): 20 mg via ORAL
  Filled 2013-08-04 (×2): qty 1

## 2013-08-04 MED ORDER — MORPHINE SULFATE 4 MG/ML IJ SOLN
6.0000 mg | Freq: Once | INTRAMUSCULAR | Status: AC
Start: 2013-08-04 — End: 2013-08-04
  Administered 2013-08-04: 6 mg via INTRAVENOUS
  Filled 2013-08-04: qty 2

## 2013-08-04 MED ORDER — INSULIN ASPART 100 UNIT/ML ~~LOC~~ SOLN: 0.0000 [IU] | Freq: Every day | SUBCUTANEOUS | Status: DC

## 2013-08-04 MED ORDER — CYCLOBENZAPRINE HCL 10 MG PO TABS: 5.0000 mg | ORAL_TABLET | Freq: Three times a day (TID) | ORAL | Status: DC | PRN

## 2013-08-04 MED ORDER — METOPROLOL TARTRATE 25 MG PO TABS
25.0000 mg | ORAL_TABLET | Freq: Two times a day (BID) | ORAL | Status: DC
  Administered 2013-08-04 – 2013-08-06 (×3): 25 mg via ORAL
  Filled 2013-08-04 (×5): qty 1

## 2013-08-04 MED ORDER — PANTOPRAZOLE SODIUM 40 MG PO TBEC
40.0000 mg | DELAYED_RELEASE_TABLET | Freq: Every day | ORAL | Status: DC
  Administered 2013-08-05 – 2013-08-06 (×2): 40 mg via ORAL
  Filled 2013-08-04 (×2): qty 1

## 2013-08-04 MED ORDER — RIVAROXABAN 20 MG PO TABS
20.0000 mg | ORAL_TABLET | Freq: Every day | ORAL | Status: DC
  Administered 2013-08-04 – 2013-08-05 (×2): 20 mg via ORAL
  Filled 2013-08-04 (×3): qty 1

## 2013-08-04 MED ORDER — HYDROXYZINE HCL 50 MG PO TABS
50.0000 mg | ORAL_TABLET | Freq: Three times a day (TID) | ORAL | Status: DC | PRN
  Filled 2013-08-04: qty 1

## 2013-08-04 MED ORDER — LISINOPRIL 5 MG PO TABS
5.0000 mg | ORAL_TABLET | Freq: Every day | ORAL | Status: DC
  Filled 2013-08-04: qty 1

## 2013-08-04 MED ORDER — SPIRONOLACTONE 25 MG PO TABS
25.0000 mg | ORAL_TABLET | Freq: Every day | ORAL | Status: DC
  Filled 2013-08-04: qty 1

## 2013-08-04 MED ORDER — AMIODARONE HCL 200 MG PO TABS
200.0000 mg | ORAL_TABLET | ORAL | Status: DC
  Administered 2013-08-05 – 2013-08-06 (×2): 200 mg via ORAL
  Filled 2013-08-04 (×2): qty 1

## 2013-08-04 MED ORDER — ACETAMINOPHEN 325 MG PO TABS: 650.0000 mg | ORAL_TABLET | ORAL | Status: DC | PRN

## 2013-08-04 MED ORDER — MORPHINE SULFATE 4 MG/ML IJ SOLN
4.0000 mg | Freq: Once | INTRAMUSCULAR | Status: AC
  Administered 2013-08-04: 4 mg via INTRAVENOUS
  Filled 2013-08-04: qty 1

## 2013-08-04 MED ORDER — LEVOTHYROXINE SODIUM 50 MCG PO TABS
50.0000 ug | ORAL_TABLET | Freq: Every day | ORAL | Status: DC
  Administered 2013-08-05 – 2013-08-06 (×2): 50 ug via ORAL
  Filled 2013-08-04 (×3): qty 1

## 2013-08-04 NOTE — H&P (Signed)
[facility] History and Physical  Patient: Elizabeth Love  ZHY:[id number]  DOB: 1956-03-04  DOS: 08/04/2013 PCP: [name], [name], MD  Chief Complaint: Chest pain  HPI: Elizabeth Love is a 57 y.o. female with Past medical history of coronary artery disease, atrial fibrillation, DVT, noncompliance, seizure, stroke coma diabetes. The patient is coming from home. The patient is presenting with the complaint of chest pain. Her chest pain has been ongoing since last few days. It is located on the left side although the pacemaker insertion site and she mentions that the pain gets worse when she takes a deep breath as well as movement. The pain felt like a sharp pain. She denies any similar pain in the past. She denies any fever, chills, cough, shortness of breath, nausea, abdominal pain, diarrhea, burning urination. She complains that she has significant pain all over her right leg which is chronic for her.  Review of Systems: as mentioned in the history of present illness.  A Comprehensive review of the other systems is negative.  Past Medical History  Diagnosis Date  . Seizures   . Hypertension   . Stroke     2007 - R sided weakness and expressive aphasia with h/o behavioral problems related to this  . Asthma   . Diabetes mellitus   . Chronic systolic CHF (congestive heart failure)   . Atrial fibrillation     a. On Xarelto after having DVT (prev felt to be poor anticoag cand 2/2 noncompliance).  . History of DVT (deep vein thrombosis)     a. 06/2012: RLE DVT, + protein C deficiency, placed on Xarelto. s/p IVC filter  . Noncompliance     Due to mental status, family has had to coax her to take medicines  . Hypothyroidism   . VT (ventricular tachycardia)     a. h/o ICD ~2007-2008. b. Adm 08/2012 with UTI, had VT/ICD shock x 5 (hypokalemic);  c. 06/2013 s/p ICD Gen change: SJM DC ICD ser #: [id number]  . Protein C deficiency   . NICM (nonischemic cardiomyopathy)     a. s/p  AICD ~2007 (St. Jude) - previous care [place]. No known hx CAD. b. EF 10% by echo 04/2012;  c. 06/2013 s/p ICD Gen change: SJM DC ICD ser #: [id number]  . 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 Cholecystectomy    . Appendectomy    . Pacemaker insertion    . Vena cava filter placement  07/19/2012    Procedure: INSERTION VENA-CAVA FILTER;  Surgeon: Larina Earthly, MD;  Location: [facility] OR;  Service: Vascular;  Laterality: [name];  . Insert / replace / remove pacemaker      ICD  . Abdominal hysterectomy     Social History:  reports that she has quit smoking. She has never used smokeless tobacco. She reports that she does not drink alcohol or use illicit drugs. Independent for most of her  ADL.  Allergies  Allergen Reactions  . Penicillins   . Sulfa Antibiotics   . Penicillins Rash  . Sulfa Antibiotics Rash    Family History  Problem Relation Age of Onset  . Hypertension Mother   . Coronary artery disease Neg Hx     Prior to Admission medications   Medication Sig Start Date End Date Taking? Authorizing Provider  acetaminophen (TYLENOL) 325 MG tablet Take 650 mg by mouth every 6 (six) hours as needed  for pain.    Historical Provider, MD  albuterol (PROVENTIL) (2.5 MG/3ML) 0.083% nebulizer solution Take 2.5 mg by nebulization every 6 (six) hours as needed. For cough or wheeze    Historical Provider, MD  ALPRAZolam Prudy Feeler) 0.5 MG tablet Take 0.5 mg by mouth at bedtime as needed for sleep.    Historical Provider, MD  amiodarone (PACERONE) 400 MG tablet Take 200 mg by mouth See admin instructions. Takes 200mg  on Mon, Tues, Wed, Thurs and Fri's only (none on weekends)    Historical Provider, MD  digoxin (LANOXIN) 0.125 MG tablet Take 1 tablet (0.125 mg total) by mouth daily. 01/10/13   Sorin Luanne Bras, MD  escitalopram (LEXAPRO) 20 MG tablet Take 20 mg by mouth daily.    Historical Provider, MD  furosemide (LASIX) 40 MG tablet Take 40 mg by mouth  2 (two) times daily.    Historical Provider, MD  hydrOXYzine (ATARAX/VISTARIL) 50 MG tablet Take 50 mg by mouth 3 (three) times daily as needed for anxiety.     Historical Provider, MD  insulin lispro (HUMALOG) 100 UNIT/ML injection Inject into the skin 3 (three) times daily before meals. On sliding scale    Historical Provider, MD  levETIRAcetam (KEPPRA) 1000 MG tablet Take 2,000 mg by mouth 2 (two) times daily.    Historical Provider, MD  levothyroxine (SYNTHROID, LEVOTHROID) 50 MCG tablet Take 50 mcg by mouth daily before breakfast.    Historical Provider, MD  lisinopril (PRINIVIL,ZESTRIL) 5 MG tablet Take 5 mg by mouth daily.    Historical Provider, MD  metolazone (ZAROXOLYN) 2.5 MG tablet Take 2.5 mg by mouth daily as needed. For fluid retention    Historical Provider, MD  metoprolol tartrate (LOPRESSOR) 25 MG tablet Take 25 mg by mouth 2 (two) times daily.    Historical Provider, MD  nystatin cream (MYCOSTATIN) Apply 1 application topically 2 (two) times daily.    Historical Provider, MD  omeprazole (PRILOSEC) 20 MG capsule Take 20 mg by mouth 2 (two) times daily.    Historical Provider, MD  oxyCODONE-acetaminophen (PERCOCET) 10-325 MG per tablet Take 1 tablet by mouth every 8 (eight) hours as needed for pain.    Historical Provider, MD  polyethylene glycol (MIRALAX / GLYCOLAX) packet Take 17 g by mouth daily as needed. For constipation    Historical Provider, MD  QUEtiapine Fumarate (SEROQUEL XR) 150 MG 24 hr tablet Take 150 mg by mouth at bedtime.    Historical Provider, MD  Rivaroxaban (XARELTO) 20 MG TABS tablet Take 20 mg by mouth daily.    Historical Provider, MD  spironolactone (ALDACTONE) 25 MG tablet Take 25 mg by mouth daily.    Historical Provider, MD  terazosin (HYTRIN) 2 MG capsule Take 2 mg by mouth at bedtime.    Historical Provider, MD  Vitamin D, Ergocalciferol, (DRISDOL) 50000 UNITS CAPS capsule Take 50,000 Units by mouth every 30 (thirty) days.    Historical Provider, MD     Physical Exam: Filed Vitals:   08/04/13 1715 08/04/13 1745 08/04/13 1930 08/04/13 2017  BP: 104/80 100/63 101/69 141/96  Pulse: 75 73 67 90  Temp:    97.9 F (36.6 C)  TempSrc:    Oral  Resp: 21 20 11 20   SpO2: 98% 94% 100% 96%    General: Alert, Awake and Oriented to Time, Place and Person. Appear in mild distress Eyes: PERRL ENT: Oral Mucosa clear moist. Neck: no JVD Cardiovascular: S1 and S2 Present, aortic systolic Murmur, Peripheral Pulses Present Respiratory: Bilateral  Air entry equal and Decreased, Basal Crackles, no wheezes Abdomen: Bowel Sound Present, Soft and Non tender Skin: no Rash Extremities: no Pedal edema, no calf tenderness Neurologic: Grossly Unremarkable.  Labs on Admission:  CBC:  Recent Labs Lab 08/04/13 1508  WBC 6.9  NEUTROABS 3.9  HGB 10.0*  HCT 31.7*  MCV 80.3  PLT 317    CMP     Component Value Date/Time   NA 136 08/04/2013 1508   K 4.4 08/04/2013 1508   CL 102 08/04/2013 1508   CO2 22 08/04/2013 1508   GLUCOSE 134* 08/04/2013 1508   BUN 23 08/04/2013 1508   CREATININE 1.04 08/04/2013 1508   CALCIUM 9.5 08/04/2013 1508   PROT 7.3 07/21/2013 2123   ALBUMIN 3.1* 07/21/2013 2123   AST 26 07/21/2013 2123   ALT 14 07/21/2013 2123   ALKPHOS 70 07/21/2013 2123   BILITOT 1.1 07/21/2013 2123   GFRNONAA 59* 08/04/2013 1508   GFRAA 69* 08/04/2013 1508    No results found for this basename: LIPASE, AMYLASE,  in the last 168 hours No results found for this basename: AMMONIA,  in the last 168 hours   Recent Labs Lab 08/04/13 1508 08/04/13 1855  TROPONINI <0.30 <0.30   BNP (last 3 results)  Recent Labs  05/31/13 1730 07/21/13 0902 08/04/13 1508  PROBNP 4747.0* 9726.0* 4130.0*    Radiological Exams on Admission: Dg Chest 2 View  08/04/2013   CLINICAL DATA:  Chest pain.  EXAM: CHEST  2 VIEW  COMPARISON:  07/19/2013.  FINDINGS: The pacer wires/ AICD are stable. The heart is enlarged but unchanged. There is central vascular  congestion and increased interstitial markings but no overt pulmonary edema. No definite pleural effusions or focal infiltrates.  IMPRESSION: Cardiac enlargement and vascular congestion without overt pulmonary edema or pleural effusion.   Electronically Signed   By: Loralie Champagne M.D.   On: 08/04/2013 16:22    EKG: Independently reviewed. unchanged from previous tracings, nonspecific ST and T waves changes.  Assessment/Plan Principal Problem:   Chest pain Active Problems:   Aphasia as late effect of cerebrovascular accident   Hypothyroidism   Seizure disorder   Protein C deficiency   Chronic pain   DM type 2 (diabetes mellitus, type 2)   Acute combined systolic and diastolic heart failure   1. Chest pain The patient is presenting with chest pain. Her initial EKG does not show any new changes and troponin level has been negative. Would admit her for observation and obtain serial troponins as well as monitor her on telemetry. Further workup will be based on the results. She has recently changed ICD but the pacemaker pocket site appears without any significant swelling or discharge. Continue to monitor.  2. Acute on chronic combined heart failure Patient has been given one dose of IV Lasix in the ED. Since she is saturating well and is responding I will dose her tomorrow morning.  3. A. fib and hypertension Continue home dose of antihypertensive including amiodarone, metoprolol, lisinopril  4. Diabetes mellitus On sliding scale  DVT Prophylaxis: On Xarelto Nutrition: Cardiac diet  Code Status: Full  Disposition: Admitted to observation in telemetry unit.  Author: Lynden Oxford, MD [facility] Pager: [phone] 08/04/2013, 8:25 PM    If 7PM-7AM, please contact night-coverage [email or web address] Password [email or web address]

## 2013-08-04 NOTE — Progress Notes (Addendum)
ANTICOAGULATION CONSULT NOTE - Initial Consult  Pharmacy Consult for Xarelto Indication: Non-valvular atrial fibrillation, hx RLE DVT, Protein C deficiency  Allergies  Allergen Reactions  . Penicillins   . Sulfa Antibiotics   . Penicillins Rash  . Sulfa Antibiotics Rash   Patient Measurements: Weight 51.7 kg on 07/25/13 Vital Signs: Temp: 97.9 F (36.6 C) (11/17 2017) Temp src: Oral (11/17 2017) BP: 141/96 mmHg (11/17 2017) Pulse Rate: 90 (11/17 2017) Labs:  Recent Labs  08/04/13 1508 08/04/13 1855  HGB 10.0*  --   HCT 31.7*  --   PLT 317  --   CREATININE 1.04  --   TROPONINI <0.30 <0.30   The CrCl is unknown because both a height and weight (above a minimum accepted value) are required for this calculation.   Medical History: Past Medical History  Diagnosis Date  . Seizures   . Hypertension   . Stroke     2007 - R sided weakness and expressive aphasia with h/o behavioral problems related to this  . Asthma   . Diabetes mellitus   . Chronic systolic CHF (congestive heart failure)   . Atrial fibrillation     a. On Xarelto after having DVT (prev felt to be poor anticoag cand 2/2 noncompliance).  . History of DVT (deep vein thrombosis)     a. 06/2012: RLE DVT, + protein C deficiency, placed on Xarelto. s/p IVC filter  . Noncompliance     Due to mental status, family has had to coax her to take medicines  . Hypothyroidism   . VT (ventricular tachycardia)     a. h/o ICD ~2007-2008. b. Adm 08/2012 with UTI, had VT/ICD shock x 5 (hypokalemic);  c. 06/2013 s/p ICD Gen change: SJM DC ICD ser #: [id number]  . Protein C deficiency   . NICM (nonischemic cardiomyopathy)     a. s/p AICD ~2007 (St. Jude) - previous care [place]. No known hx CAD. b. EF 10% by echo 04/2012;  c. 06/2013 s/p ICD Gen change: SJM DC ICD ser #: [id number]  . Renal disorder     ?infection per brother   Medications:  Xarelto 20mg  daily PTA (last dose 08/03/13 per son)  Assessment: 25 YOF with  non-ischemic CM (EF 15%) s/p recent admission for HTN emergency and acute respiratory failure discharged on 07/27/13 now re-admitted for respiratory distress and HTN. Patient was on Xarelto PTA for hx DVT, Protein C deficiency, and nonvalvular afib at dose of 20mg  daily maintained while in-patient on last admission and discharged on same dosage.   To continue Xarelto while inpatient. SCr is 1.04/estCrCl~ 35-61mL/min. Hgb 10/Hct 31.7 (stable from last admit). Platelets are wnl at 317. No bleeding issues reported.   Spoke with patient's son briefly on the phone. Patient's last doses were 08/03/13 in the PM. Son is going to come to complete medication history in the AM.   Goal of Therapy:  Appropriate anticoagulation Monitor platelets by anticoagulation protocol: Yes   Plan:  Continue Xarelto 20mg  po daily - next dose tonight.  Monitor renal function and need for dose adjustment.   Link Snuffer, PharmD, BCPS Clinical Pharmacist [phone] 08/04/2013,8:39 PM

## 2013-08-04 NOTE — ED Provider Notes (Signed)
CSN: [id number]     Arrival date & time 08/04/13  1444 History   First MD Initiated Contact with Patient 08/04/13 1503     Chief Complaint  Patient presents with  . Chest Pain   (Consider location/radiation/quality/duration/timing/severity/associated sxs/prior Treatment) HPI Comments: 57 yo female with DM, stroke hx, DVT, a fib, VT, recent defibrillator adjustment or placed 3 wks prior, pt lives in [place] present with constant ache in left upper chest.  Non radiating.  No exertional component, worse with pressing on defib site.  Subj fevers at home, no discharge.  Constant.  No new sob or leg swelling. Nothing improves/  Patient is a 57 y.o. female presenting with chest pain. The history is provided by the patient.  Chest Pain Associated symptoms: headache (similar to previous, gradual onset), shortness of breath (chronic) and weakness (chronic from stroke)   Associated symptoms: no abdominal pain, no back pain, no fever and not vomiting     Past Medical History  Diagnosis Date  . Seizures   . Hypertension   . Stroke     2007 - R sided weakness and expressive aphasia with h/o behavioral problems related to this  . Asthma   . Diabetes mellitus   . Chronic systolic CHF (congestive heart failure)   . Atrial fibrillation     a. On Xarelto after having DVT (prev felt to be poor anticoag cand 2/2 noncompliance).  . History of DVT (deep vein thrombosis)     a. 06/2012: RLE DVT, + protein C deficiency, placed on Xarelto. s/p IVC filter  . Noncompliance     Due to mental status, family has had to coax her to take medicines  . Hypothyroidism   . VT (ventricular tachycardia)     a. h/o ICD ~2007-2008. b. Adm 08/2012 with UTI, had VT/ICD shock x 5 (hypokalemic);  c. 06/2013 s/p ICD Gen change: SJM DC ICD ser #: [id number]  . Protein C deficiency   . NICM (nonischemic cardiomyopathy)     a. s/p AICD ~2007 (St. Jude) - previous care [place]. No known hx CAD. b. EF 10% by echo 04/2012;  c.  06/2013 s/p ICD Gen change: SJM DC ICD ser #: [id number]  . 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 Cholecystectomy    . Appendectomy    . Pacemaker insertion    . Vena cava filter placement  07/19/2012    Procedure: INSERTION VENA-CAVA FILTER;  Surgeon: Larina Earthly, MD;  Location: [facility] OR;  Service: Vascular;  Laterality: [name];  . Insert / replace / remove pacemaker      ICD  . Abdominal hysterectomy     Family History  Problem Relation Age of Onset  . Hypertension Mother   . Coronary artery disease Neg Hx    History  Substance Use Topics  . Smoking status: Former Games developer  . Smokeless tobacco: Never Used  . Alcohol Use: No   OB History   Grav Para Term Preterm Abortions TAB SAB Ect Mult Living                 Review of Systems  Constitutional: Negative for fever and chills.  HENT: Negative for congestion.   Eyes: Negative for visual disturbance.  Respiratory: Positive for shortness of breath (chronic).   Cardiovascular: Positive for chest pain.  Gastrointestinal: Negative for vomiting and abdominal pain.  Genitourinary: Negative for dysuria and flank pain.  Musculoskeletal:  Negative for back pain, neck pain and neck stiffness.  Skin: Positive for wound (surgical). Negative for rash.  Neurological: Positive for weakness (chronic from stroke) and headaches (similar to previous, gradual onset). Negative for light-headedness.    Allergies  Penicillins; Sulfa antibiotics; Penicillins; and Sulfa antibiotics  Home Medications   Current Outpatient Rx  Name  Route  Sig  Dispense  Refill  . acetaminophen (TYLENOL) 325 MG tablet   Oral   Take 650 mg by mouth every 6 (six) hours as needed for pain.         Marland Kitchen albuterol (PROVENTIL) (2.5 MG/3ML) 0.083% nebulizer solution   Nebulization   Take 2.5 mg by nebulization every 6 (six) hours as needed. For cough or wheeze         . ALPRAZolam (XANAX) 0.5 MG tablet   Oral    Take 0.5 mg by mouth at bedtime as needed for sleep.         Marland Kitchen amiodarone (PACERONE) 400 MG tablet   Oral   Take 200 mg by mouth See admin instructions. Takes 200mg  on Mon, Tues, Wed, Thurs and Fri's only (none on weekends)         . digoxin (LANOXIN) 0.125 MG tablet   Oral   Take 1 tablet (0.125 mg total) by mouth daily.   30 tablet   0   . escitalopram (LEXAPRO) 20 MG tablet   Oral   Take 20 mg by mouth daily.         . furosemide (LASIX) 40 MG tablet   Oral   Take 40 mg by mouth 2 (two) times daily.         . hydrOXYzine (ATARAX/VISTARIL) 50 MG tablet   Oral   Take 50 mg by mouth 3 (three) times daily as needed for anxiety.          . insulin lispro (HUMALOG) 100 UNIT/ML injection   Subcutaneous   Inject into the skin 3 (three) times daily before meals. On sliding scale         . levETIRAcetam (KEPPRA) 1000 MG tablet   Oral   Take 2,000 mg by mouth 2 (two) times daily.         Marland Kitchen levETIRAcetam (KEPPRA) 500 MG tablet   Oral   Take 1,000 mg by mouth 2 (two) times daily.         Marland Kitchen levothyroxine (SYNTHROID, LEVOTHROID) 50 MCG tablet   Oral   Take 50 mcg by mouth daily before breakfast.         . lisinopril (PRINIVIL,ZESTRIL) 5 MG tablet   Oral   Take 5 mg by mouth daily.         . metolazone (ZAROXOLYN) 2.5 MG tablet   Oral   Take 2.5 mg by mouth daily as needed. For fluid retention         . metoprolol tartrate (LOPRESSOR) 25 MG tablet   Oral   Take 25 mg by mouth 2 (two) times daily.         Marland Kitchen nystatin cream (MYCOSTATIN)   Topical   Apply 1 application topically 2 (two) times daily.         Marland Kitchen omeprazole (PRILOSEC) 20 MG capsule   Oral   Take 20 mg by mouth 2 (two) times daily.         Marland Kitchen oxyCODONE-acetaminophen (PERCOCET) 10-325 MG per tablet   Oral   Take 1 tablet by mouth every 6 (six) hours as needed for pain.  Scheduled per pharmacy   30 tablet   0   . oxyCODONE-acetaminophen (PERCOCET) 10-325 MG per tablet   Oral    Take 1 tablet by mouth every 8 (eight) hours as needed for pain.         . polyethylene glycol (MIRALAX / GLYCOLAX) packet   Oral   Take 17 g by mouth daily as needed. For constipation         . potassium chloride SA (K-DUR,KLOR-CON) 20 MEQ tablet   Oral   Take 20 mEq by mouth 2 (two) times daily.         . potassium chloride SA (K-DUR,KLOR-CON) 20 MEQ tablet   Oral   Take 20 mEq by mouth daily.         . QUEtiapine Fumarate (SEROQUEL XR) 150 MG 24 hr tablet   Oral   Take 150 mg by mouth at bedtime.         . Rivaroxaban (XARELTO) 20 MG TABS tablet   Oral   Take 20 mg by mouth daily.         . Rivaroxaban (XARELTO) 20 MG TABS   Oral   Take 1 tablet (20 mg total) by mouth daily with supper.   30 tablet   6   . spironolactone (ALDACTONE) 25 MG tablet   Oral   Take 1 tablet (25 mg total) by mouth daily.   30 tablet   3   . spironolactone (ALDACTONE) 25 MG tablet   Oral   Take 25 mg by mouth daily.         Marland Kitchen terazosin (HYTRIN) 2 MG capsule   Oral   Take 2 mg by mouth at bedtime.         . Vitamin D, Ergocalciferol, (DRISDOL) 50000 UNITS CAPS capsule   Oral   Take 50,000 Units by mouth every 30 (thirty) days.          BP 121/76  Pulse 85  Temp(Src) 98.3 F (36.8 C) (Oral)  Resp 16  SpO2 98% Physical Exam  Nursing note and vitals reviewed. Constitutional: She is oriented to person, place, and time. She appears well-developed and well-nourished.  HENT:  Head: Normocephalic and atraumatic.  Eyes: Conjunctivae are normal. Right eye exhibits no discharge. Left eye exhibits no discharge.  Neck: Normal range of motion. Neck supple. No tracheal deviation present.  Cardiovascular: Normal rate and regular rhythm.   Pulmonary/Chest: Effort normal and breath sounds normal.  Abdominal: Soft. She exhibits no distension. There is no tenderness. There is no guarding.  Musculoskeletal: She exhibits tenderness (left chest wall at defib site, no warmth/  induration or erythema). She exhibits no edema.  Neurological: She is alert and oriented to person, place, and time.  Right sided weakness chronic Mild difficulty with exp aphasia chronic  Skin: Skin is warm. No rash noted.  Psychiatric: She has a normal mood and affect.    ED Course  Procedures (including critical care time) Labs Review Labs Reviewed  CBC WITH DIFFERENTIAL - Abnormal; Notable for the following:    Hemoglobin 10.0 (*)    HCT 31.7 (*)    MCH 25.3 (*)    RDW 18.6 (*)    All other components within normal limits  CULTURE, BLOOD (ROUTINE X 2)  CULTURE, BLOOD (ROUTINE X 2)  BASIC METABOLIC PANEL  TROPONIN I  PRO B NATRIURETIC PEPTIDE  DIGOXIN LEVEL  SEDIMENTATION RATE  C-REACTIVE PROTEIN   Imaging Review Dg Chest 2 View  08/04/2013  CLINICAL DATA:  Chest pain.  EXAM: CHEST  2 VIEW  COMPARISON:  07/19/2013.  FINDINGS: The pacer wires/ AICD are stable. The heart is enlarged but unchanged. There is central vascular congestion and increased interstitial markings but no overt pulmonary edema. No definite pleural effusions or focal infiltrates.  IMPRESSION: Cardiac enlargement and vascular congestion without overt pulmonary edema or pleural effusion.   Electronically Signed   By: Loralie Champagne M.D.   On: 08/04/2013 16:22    EKG Interpretation    Date/Time:  Monday August 04 2013 14:53:23 EST Ventricular Rate:  80 PR Interval:  247 QRS Duration: 125 QT Interval:  434 QTC Calculation: 501 R Axis:   39 Text Interpretation:  Sinus rhythm Prolonged PR interval Left atrial enlargement Left bundle branch block similar to previous            MDM   1. Acute chest pain   2. CHF (congestive heart failure)   3. Acute combined systolic and diastolic heart failure   4. Atrial fibrillation   5. Chronic pain   6. DM type 2 (diabetes mellitus, type 2)   7. History of DVT (deep vein thrombosis)   8. Seizure disorder    Cardiac hx, plan for screening ekg/  enzymes however clinically msk vs deep infection to defib pocket/ muscle spasm, no fevers, blood work  Pt continued to have intermittent pain in ED.   Discussed with hospitalist for admission, requested lasix in ED.   Pt mild improvement on recheck, pain meds given.  Chest pain acute, DVT, DM, CHF   Enid Skeens, MD 08/05/13 [phone]

## 2013-08-04 NOTE — ED Notes (Signed)
Pt brought via EMS for L sided reproducible chest pain 2-weeks post defibrillator/pacemaker placement.

## 2013-08-04 NOTE — Progress Notes (Signed)
Pt refusing to take Keppra and Seroquel this PM. Pt states she already had this medicine today. Pt stating she is still in pan right now, pt refusing pain medicine at this time. Will continue to monitor. Baron Hamper, RN

## 2013-08-04 NOTE — ED Notes (Signed)
Attempt at lab draw with no success

## 2013-08-05 ENCOUNTER — Inpatient Hospital Stay (HOSPITAL_COMMUNITY): Admission: RE | Admit: 2013-08-05 | Payer: Medicare Other | Source: Ambulatory Visit

## 2013-08-05 DIAGNOSIS — R079 Chest pain, unspecified: Secondary | ICD-10-CM

## 2013-08-05 LAB — CBC WITH DIFFERENTIAL/PLATELET
Basophils Absolute: 0 10*3/uL (ref 0.0–0.1)
Basophils Relative: 0 % (ref 0–1)
Eosinophils Absolute: 0.1 10*3/uL (ref 0.0–0.7)
Eosinophils Relative: 2 % (ref 0–5)
Hemoglobin: 9.7 g/dL — ABNORMAL LOW (ref 12.0–15.0)
Lymphocytes Relative: 21 % (ref 12–46)
MCH: 24.5 pg — ABNORMAL LOW (ref 26.0–34.0)
MCHC: 30.5 g/dL (ref 30.0–36.0)
Monocytes Absolute: 0.4 10*3/uL (ref 0.1–1.0)
Monocytes Relative: 8 % (ref 3–12)
Neutro Abs: 3.3 10*3/uL (ref 1.7–7.7)
Platelets: 281 10*3/uL (ref 150–400)
RDW: 18.5 % — ABNORMAL HIGH (ref 11.5–15.5)
WBC: 4.8 10*3/uL (ref 4.0–10.5)

## 2013-08-05 LAB — TROPONIN I
Troponin I: <0.3 ng/mL (ref <0.30)
Troponin I: <0.3 ng/mL (ref <0.30)

## 2013-08-05 LAB — GLUCOSE, CAPILLARY
Glucose-Capillary: 111 mg/dL — ABNORMAL HIGH (ref 70–99)
Glucose-Capillary: 132 mg/dL — ABNORMAL HIGH (ref 70–99)

## 2013-08-05 MED ORDER — FUROSEMIDE 10 MG/ML IJ SOLN
20.0000 mg | Freq: Once | INTRAMUSCULAR | Status: AC
  Administered 2013-08-05: 12:00:00 20 mg via INTRAVENOUS

## 2013-08-05 NOTE — Progress Notes (Signed)
INITIAL NUTRITION ASSESSMENT  DOCUMENTATION CODES Per approved criteria  -Not Applicable   INTERVENTION: Add Resource Breeze po BID, each supplement provides 250 kcal and 9 grams of protein. RD to continue to follow nutrition care plan.  NUTRITION DIAGNOSIS: Inadequate oral intake related to poor appetite and food preferences as evidenced by poor meal completion.   Goal: Intake to meet >90% of estimated nutrition needs.  Monitor:  weight trends, lab trends, I/O's, PO intake, supplement tolerance  Reason for Assessment: Malnutrition Screening Tool  57 y.o. female  Admitting Dx: Chest pain  ASSESSMENT: PMHx significant for CAD, afib, DVT, noncompliance, seizure, stroke, coma, DM. Admitted with chest pain x few days. Work-up ongoing - cardiac markers are negative, EKG is unchanged. Cards has been consulted.  Pt with approximately 16% wt loss x 1 year, this is not significant. Also, pt's weight has been very stable (within 4 lb range x 4 months.) Currently ordered for a Heart Healthy diet. Meal intake is poor, pt is consuming 0-25%. Attempted to speak with patient 3 times today, however pt was either asleep or busy with RN. Per nursing staff, pt reported that she didn't like her lunch order, so she was ordered an alternative lunch, however this lunch remains untouched as pt is currently sleeping.  Pt is at nutrition risk 2/2 remote weight loss, chronic medical issues and noncompliance.  Height: Ht Readings from Last 1 Encounters:  08/04/13 5\' 3"  (1.6 m)    Weight: Wt Readings from Last 1 Encounters:  08/05/13 114 lb 10.2 oz (52 kg)    Ideal Body Weight: 115 lb  % Ideal Body Weight: 99%  Wt Readings from Last 30 Encounters:  08/05/13 114 lb 10.2 oz (52 kg)  07/25/13 113 lb 14.4 oz (51.665 kg)  07/03/13 116 lb (52.617 kg)  07/03/13 116 lb (52.617 kg)  06/23/13 116 lb (52.617 kg)  06/18/13 116 lb 12.8 oz (52.98 kg)  05/31/13 115 lb (52.164 kg)  04/14/13 115 lb 4.8 oz  (52.3 kg)  04/01/13 118 lb (53.524 kg)  02/26/13 108 lb 12.8 oz (49.351 kg)  01/31/13 110 lb (49.896 kg)  01/17/13 116 lb 6.4 oz (52.799 kg)  01/10/13 118 lb 13.3 oz (53.9 kg)  11/30/12 115 lb (52.164 kg)  11/08/12 115 lb (52.164 kg)  11/05/12 116 lb 12.8 oz (52.98 kg)  09/26/12 119 lb (53.978 kg)  09/19/12 125 lb 3.2 oz (56.79 kg)  08/28/12 133 lb 9.6 oz (60.6 kg)  08/01/12 135 lb (61.236 kg)  07/25/12 135 lb (61.236 kg)  07/22/12 135 lb 2.3 oz (61.3 kg)  07/22/12 135 lb 2.3 oz (61.3 kg)  04/24/12 157 lb 11.2 oz (71.532 kg)    Usual Body Weight: 135 lb  % Usual Body Weight: 84%  BMI:  Body mass index is 20.31 kg/(m^2). WNL  Estimated Nutritional Needs: Kcal: 1500 - 1700 Protein: 52 - 65 g Fluid: 1.5 - 1.7 liters  Skin: intact  Diet Order: Cardiac  EDUCATION NEEDS: -No education needs identified at this time   Intake/Output Summary (Last 24 hours) at 08/05/13 1229 Last data filed at 08/05/13 1100  Gross per 24 hour  Intake    240 ml  Output    500 ml  Net   -260 ml    Last BM: 11/17  Labs:   Recent Labs Lab 08/04/13 1508  NA 136  K 4.4  CL 102  CO2 22  BUN 23  CREATININE 1.04  CALCIUM 9.5  GLUCOSE 134*  CBG (last 3)   Recent Labs  08/04/13 2036 08/05/13 0621 08/05/13 1153  GLUCAP 121* 111* 132*    Scheduled Meds: . amiodarone  200 mg Oral Custom  . digoxin  0.125 mg Oral Daily  . escitalopram  20 mg Oral Daily  . furosemide  40 mg Oral BID  . insulin aspart  0-15 Units Subcutaneous TID WC  . insulin aspart  0-5 Units Subcutaneous QHS  . levETIRAcetam  2,000 mg Oral BID  . levothyroxine  50 mcg Oral QAC breakfast  . metoprolol tartrate  25 mg Oral BID  . pantoprazole  40 mg Oral Daily  . QUEtiapine Fumarate  150 mg Oral QHS  . rivaroxaban  20 mg Oral Q supper    Continuous Infusions:  none  Past Medical History  Diagnosis Date  . Seizures   . Hypertension   . Stroke     2007 - R sided weakness and expressive aphasia with  h/o behavioral problems related to this  . Asthma   . Diabetes mellitus   . Chronic systolic CHF (congestive heart failure)   . Atrial fibrillation     a. On Xarelto after having DVT (prev felt to be poor anticoag cand 2/2 noncompliance).  . History of DVT (deep vein thrombosis)     a. 06/2012: RLE DVT, + protein C deficiency, placed on Xarelto. s/p IVC filter  . Noncompliance     Due to mental status, family has had to coax her to take medicines  . Hypothyroidism   . VT (ventricular tachycardia)     a. h/o ICD ~2007-2008. b. Adm 08/2012 with UTI, had VT/ICD shock x 5 (hypokalemic);  c. 06/2013 s/p ICD Gen change: SJM DC ICD ser #: [id number]  . Protein C deficiency   . NICM (nonischemic cardiomyopathy)     a. s/p AICD ~2007 ([employer]. Jude) - previous care [place]. No known hx CAD. b. EF 10% by echo 04/2012;  c. 06/2013 s/p ICD Gen change: SJM DC ICD ser #: [id number]  . 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 Cholecystectomy    . Appendectomy    . Pacemaker insertion    . Vena cava filter placement  07/19/2012    Procedure: INSERTION VENA-CAVA FILTER;  Surgeon: Larina Earthly, MD;  Location: [facility];  Service: Vascular;  Laterality: [name];  . Insert / replace / remove pacemaker      ICD  . Abdominal hysterectomy      Jarold Motto MS, RD, LDN Pager: [phone] After-hours pager: [phone]

## 2013-08-05 NOTE — Consult Note (Addendum)
ELECTROPHYSIOLOGY CONSULT NOTE    Patient ID: Elizabeth Love MRN: [id number], DOB/AGE: [birth year] 57 y.o.  Admit date: 08/04/2013 Date of Consult: 08-05-2013  Primary Physician: [name], [name], MD Primary Cardiologist: [name] (CHF clinic) Electrophysiologist: [name], MD  Reason for Consultation: Pain around ICD site  HPI:  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  She has had multiple admission for various problems.  The last was 07-21-13 for acute on chronic CHF and HTN, she was diuresed and discharged on 07-27-2013.  She has had multiple complaints of pain over her ICD site for several months.  This is reflected in Dr Odessa Fleming note prior to her generator change in October of this year.  Her history is very difficult secondary to her expressive aphasia.  She is tearful and appears anxious today.  She states that she has had pain over her ICD site for 5 days.  There is no drainage or swelling apparent.  She also says that she has had leg swelling and shortness of breath.  She states that she has been compliant with her medications.  Last echo 07-22-13 demonstrated an EF of 20% with diffuse hypokinesis, moderate MR, moderate TR, LA 56.  Labs this admission are notable for negative cardiac enzymes, normal white count, final blood cultures pending (no growth to date), normal sed rate.  ROS is negative except as outlined above.  EP has been asked to evaluate for pain around ICD site.   Past Medical History  Diagnosis Date  . Seizures   . Hypertension   .  Stroke     2007 - R sided weakness and expressive aphasia with h/o behavioral problems related to this  . Asthma   . Diabetes mellitus   . Chronic systolic CHF (congestive heart failure)   . Atrial fibrillation     a. On Xarelto after having DVT (prev felt to be poor anticoag cand 2/2 noncompliance).  . History of DVT (deep vein thrombosis)     a. 06/2012: RLE DVT, + protein C deficiency, placed on Xarelto. s/p IVC filter  . Noncompliance     Due to mental status, family has had to coax her to take medicines  . Hypothyroidism   . VT (ventricular tachycardia)     a. h/o ICD ~2007-2008. b. Adm 08/2012 with UTI, had VT/ICD shock x 5 (hypokalemic);  c. 06/2013 s/p ICD Gen change: SJM DC ICD ser #: [id number]  . Protein C deficiency   . NICM (nonischemic cardiomyopathy)     a. s/p AICD ~2007 ([employer]. Jude) - previous care [place]. No known hx CAD. b. EF 10% by echo 04/2012;  c. 06/2013 s/p ICD Gen change: SJM DC ICD ser #: [id number]  . Renal disorder     ?infection per brother     Surgical History:  Past Surgical History  Procedure Laterality Date  . Cholecystectomy    . Appendectomy    . Pacemaker insertion    . Vena cava filter placement  07/19/2012    Procedure: INSERTION VENA-CAVA FILTER;  Surgeon: Larina Earthly, MD;  Location: [facility] OR;  Service: Vascular;  Laterality:  [name];  . Insert / replace / remove pacemaker      ICD  . Abdominal hysterectomy       Prescriptions prior to admission  Medication Sig Dispense Refill  . acetaminophen (TYLENOL) 325 MG tablet Take 650 mg by mouth every 6 (six) hours as needed for pain.      Marland Kitchen albuterol (PROVENTIL) (2.5 MG/3ML) 0.083% nebulizer solution Take 2.5 mg by nebulization every 6 (six) hours as needed. For cough or wheeze      . ALPRAZolam (XANAX) 0.5 MG tablet Take 0.5 mg by mouth at bedtime as needed for sleep.      Marland Kitchen amiodarone (PACERONE) 400 MG tablet Take 200 mg by mouth See admin instructions. Takes 200mg  on Mon, Tues, Wed, Thurs and Fri's only  (none on weekends)      . digoxin (LANOXIN) 0.125 MG tablet Take 1 tablet (0.125 mg total) by mouth daily.  30 tablet  0  . escitalopram (LEXAPRO) 20 MG tablet Take 20 mg by mouth daily.      . furosemide (LASIX) 40 MG tablet Take 40 mg by mouth 2 (two) times daily.      . hydrOXYzine (ATARAX/VISTARIL) 50 MG tablet Take 50 mg by mouth 3 (three) times daily as needed for anxiety.       . insulin lispro (HUMALOG) 100 UNIT/ML injection Inject into the skin 3 (three) times daily before meals. On sliding scale      . levETIRAcetam (KEPPRA) 1000 MG tablet Take 2,000 mg by mouth 2 (two) times daily.      Marland Kitchen levothyroxine (SYNTHROID, LEVOTHROID) 50 MCG tablet Take 50 mcg by mouth daily before breakfast.      . lisinopril (PRINIVIL,ZESTRIL) 5 MG tablet Take 5 mg by mouth daily.      . metolazone (ZAROXOLYN) 2.5 MG tablet Take 2.5 mg by mouth daily as needed. For fluid retention      . metoprolol tartrate (LOPRESSOR) 25 MG tablet Take 25 mg by mouth 2 (two) times daily.      Marland Kitchen nystatin cream (MYCOSTATIN) Apply 1 application topically 2 (two) times daily.      Marland Kitchen omeprazole (PRILOSEC) 20 MG capsule Take 20 mg by mouth 2 (two) times daily.      Marland Kitchen oxyCODONE-acetaminophen (PERCOCET) 10-325 MG per tablet Take 1 tablet by mouth every 8 (eight) hours as needed for pain.      . polyethylene glycol (MIRALAX / GLYCOLAX) packet Take 17 g by mouth daily as needed. For constipation      . QUEtiapine Fumarate (SEROQUEL XR) 150 MG 24 hr tablet Take 150 mg by mouth at bedtime.      . Rivaroxaban (XARELTO) 20 MG TABS tablet Take 20 mg by mouth daily.      Marland Kitchen spironolactone (ALDACTONE) 25 MG tablet Take 25 mg by mouth daily.      Marland Kitchen terazosin (HYTRIN) 2 MG capsule Take 2 mg by mouth at bedtime.      . Vitamin D, Ergocalciferol, (DRISDOL) 50000 UNITS CAPS capsule Take 50,000 Units by mouth every 30 (thirty) days.        Inpatient Medications:  . amiodarone  200 mg Oral Custom  . digoxin  0.125 mg Oral Daily  . escitalopram   20 mg Oral Daily  . furosemide  20 mg Intravenous Once  . furosemide  40 mg Oral BID  . insulin aspart  0-15 Units Subcutaneous TID WC  . insulin aspart  0-5 Units Subcutaneous QHS  . levETIRAcetam  2,000 mg Oral BID  . levothyroxine  50 mcg Oral QAC breakfast  . metoprolol tartrate  25 mg Oral BID  . pantoprazole  40 mg Oral Daily  . QUEtiapine Fumarate  150 mg Oral QHS  . rivaroxaban  20 mg Oral Q supper    Allergies:  Allergies  Allergen Reactions  . Penicillins   . Sulfa Antibiotics   . Penicillins Rash  . Sulfa Antibiotics Rash    History   Social History  . Marital Status: Single    Spouse Name: [name]    Number of Children: [name]  . Years of Education: [name]   Occupational History  . Not on file.   Social History Main Topics  . Smoking status: Former Games developer  . Smokeless tobacco: Never Used  . Alcohol Use: No  . Drug Use: No  . Sexual Activity: No   Other Topics Concern  . Not on file   Social History Narrative   ** Merged History Encounter **         Family History  Problem Relation Age of Onset  . Hypertension Mother   . Coronary artery disease Neg Hx     Physical Exam: Filed Vitals:   08/04/13 2155 08/05/13 0247 08/05/13 0548 08/05/13 1211  BP: 120/99 95/56 93/59  114/81  Pulse: 75 63 61 77  Temp:  98.5 F (36.9 C) 97.4 F (36.3 C)   TempSrc:  Oral Oral   Resp:  18 17   Height:      Weight:   114 lb 10.2 oz (52 kg)   SpO2:  97% 100%     GEN- The patient is chronically ill and thin appearing, she is easily agitated and tearful, she does not wish to be examined Head- normocephalic, atraumatic Eyes-  Sclera clear, conjunctiva pink Ears- hearing intact Oropharynx- clear Neck- supple,  Lungs- Clear to ausculation bilaterally, normal work of breathing Heart- Regular rate and rhythm GI unable to examine   Extremities- no clubbing, cyanosis, or edema MS- diffuse muscle atrophy Skin- no rash or lesion ICD pocket is without warm, fluctuance, or  drainage,  Pocket looks good without redness, no evidence of infection   Labs:   Lab Results  Component Value Date   WBC 6.9 08/04/2013   HGB 10.0* 08/04/2013   HCT 31.7* 08/04/2013   MCV 80.3 08/04/2013   PLT 317 08/04/2013    Recent Labs Lab 08/04/13 1508  NA 136  K 4.4  CL 102  CO2 22  BUN 23  CREATININE 1.04  CALCIUM 9.5  GLUCOSE 134*   Lab Results  Component Value Date   CKTOTAL 100 04/23/2012   CKMB 2.6 04/23/2012   TROPONINI <0.30 08/05/2013     Radiology/Studies: Dg Chest 2 View 08/04/2013   CLINICAL DATA:  Chest pain.  EXAM: CHEST  2 VIEW  COMPARISON:  07/19/2013.  FINDINGS: The pacer wires/ AICD are stable. The heart is enlarged but unchanged. There is central vascular congestion and increased interstitial markings but no overt pulmonary edema. No definite pleural effusions or focal infiltrates.  IMPRESSION: Cardiac enlargement and vascular congestion without overt pulmonary edema or pleural effusion.   Electronically Signed   By: Loralie Champagne M.D.   On: 08/04/2013 16:22   ZOX:WRUEA rhythm at 80, PR 247  TELEMETRY: sinus rhythm with occasional atrial pacing, occasional PVC's  Assessment and Plan:  1.  Atypical chest pain Her pain is chest wall pain.  The pain is clearly reproducible on exam and is around  her ICD site.  Her CMs are negative.  I do not feel that further CV risk stratification is required at this time.  She has a h/o hypercoagulability for which she is on xarelto.  I think that PTE is less likely but will defer this workup to the primary team.  She does have a fair bit of ICD pocket tenderness The pocket looks good.  Upon review of medical records, her pain precedes her recent generator change.  Her pocket was unremarkable at time of recent generator change. Supportive care is advised.  Tylenol for pain.  I would not advise IV pain medicine. She will follow with Dr Graciela Husbands

## 2013-08-05 NOTE — Progress Notes (Signed)
[facility] PROGRESS NOTE Interim History: 57 y.o. female with Past medical history of coronary artery disease, atrial fibrillation, DVT, noncompliance, seizure, stroke coma diabetes.  The patient is coming from home.  The patient is presenting with the complaint of chest pain. Her chest pain has been ongoing since last few days. It is located on the left side although the pacemaker insertion site and she mentions that the pain gets worse when she takes a deep breath as well as movement. The pain felt like a sharp pain. She denies any similar pain in the past. She denies any fever, chills, cough, shortness of breath, nausea, abdominal pain, diarrhea, burning urination.  Filed Weights   08/04/13 2017 08/05/13 0548  Weight: 52.073 kg (114 lb 12.8 oz) 52 kg (114 lb 10.2 oz)        Intake/Output Summary (Last 24 hours) at 08/05/13 1056 Last data filed at 08/05/13 0117  Gross per 24 hour  Intake    120 ml  Output    200 ml  Net    -80 ml     Assessment/Plan: Chest pain: -  Cardiac markers negative x 3. EKG unchanged from previous. - pacemaker site exquisitely tender to palpation. 06/2013 s/p ICD Gen change - CRP < 0.5 - Unclear etiology of chest pain. Consult cards.  Chronic systolic and diastolic heart failure - Oral lasix, strict I and O's, low sodium diet. - No JVD, trace edema on her lower extremity. - lungs are clear.  DM type 2 (diabetes mellitus, type 2) - cont SSI.  Protein C deficiency - cont xarelto.     Code Status: Full  Disposition: Admitted to observation in telemetry unit.    Consultants:  cardiology  Procedures: ECHO: none  Antibiotics:  none (indicate start date, and stop date if known)  HPI/Subjective: Complaining of chest pain around the pacemaker site.  Objective: Filed Vitals:   08/04/13 2017 08/04/13 2155 08/05/13 0247 08/05/13 0548  BP: 141/96 120/99 95/56 93/59   Pulse: 90 75 63 61  Temp: 97.9 F (36.6 C)  98.5 F (36.9 C)  97.4 F (36.3 C)  TempSrc: Oral  Oral Oral  Resp: 20  18 17   Height: 5\' 3"  (1.6 m)     Weight: 52.073 kg (114 lb 12.8 oz)   52 kg (114 lb 10.2 oz)  SpO2: 96%  97% 100%     Exam:  General: Alert, awake, oriented x1, in no acute distress.  HEENT: No bruits, no goiter.  Heart: Regular rate and rhythm, without murmurs, rubs, gallops.  Lungs: Good air movement, cleat to auscultation Abdomen: Soft, nontender, nondistended, positive bowel sounds.     Data Reviewed: Basic Metabolic Panel:  Recent Labs Lab 08/04/13 1508  NA 136  K 4.4  CL 102  CO2 22  GLUCOSE 134*  BUN 23  CREATININE 1.04  CALCIUM 9.5   Liver Function Tests: No results found for this basename: AST, ALT, ALKPHOS, BILITOT, PROT, ALBUMIN,  in the last 168 hours No results found for this basename: LIPASE, AMYLASE,  in the last 168 hours No results found for this basename: AMMONIA,  in the last 168 hours CBC:  Recent Labs Lab 08/04/13 1508  WBC 6.9  NEUTROABS 3.9  HGB 10.0*  HCT 31.7*  MCV 80.3  PLT 317   Cardiac Enzymes:  Recent Labs Lab 08/04/13 1508 08/04/13 1855 08/05/13 0125 08/05/13 0700  TROPONINI <0.30 <0.30 <0.30 <0.30   BNP (last 3 results)  Recent Labs  05/31/13 1730 07/21/13  0902 08/04/13 1508  PROBNP 4747.0* 9726.0* 4130.0*   CBG:  Recent Labs Lab 08/04/13 2036 08/05/13 0621  GLUCAP 121* 111*    Recent Results (from the past 240 hour(s))  CULTURE, BLOOD (ROUTINE X 2)     Status: None   Collection Time    08/04/13  4:24 PM      Result Value Range Status   Specimen Description BLOOD RIGHT ANTECUBITAL   Final   Special Requests BOTTLES DRAWN AEROBIC AND ANAEROBIC 10CC   Final   Culture  Setup Time     Final   Value: 08/04/2013 22:44     Performed at [facility]   Culture     Final   Value:        BLOOD CULTURE RECEIVED NO GROWTH TO DATE CULTURE WILL BE HELD FOR 5 DAYS BEFORE ISSUING A FINAL NEGATIVE REPORT     Performed at [facility]   Report  Status PENDING   Incomplete  CULTURE, BLOOD (ROUTINE X 2)     Status: None   Collection Time    08/04/13  4:35 PM      Result Value Range Status   Specimen Description BLOOD ARM LEFT   Final   Special Requests BOTTLES DRAWN AEROBIC AND ANAEROBIC 5CC   Final   Culture  Setup Time     Final   Value: 08/04/2013 22:44     Performed at [facility]   Culture     Final   Value:        BLOOD CULTURE RECEIVED NO GROWTH TO DATE CULTURE WILL BE HELD FOR 5 DAYS BEFORE ISSUING A FINAL NEGATIVE REPORT     Performed at [facility]   Report Status PENDING   Incomplete     Studies: Dg Chest 2 View  08/04/2013   CLINICAL DATA:  Chest pain.  EXAM: CHEST  2 VIEW  COMPARISON:  07/19/2013.  FINDINGS: The pacer wires/ AICD are stable. The heart is enlarged but unchanged. There is central vascular congestion and increased interstitial markings but no overt pulmonary edema. No definite pleural effusions or focal infiltrates.  IMPRESSION: Cardiac enlargement and vascular congestion without overt pulmonary edema or pleural effusion.   Electronically Signed   By: Loralie Champagne M.D.   On: 08/04/2013 16:22    Scheduled Meds: . amiodarone  200 mg Oral Custom  . digoxin  0.125 mg Oral Daily  . escitalopram  20 mg Oral Daily  . furosemide  20 mg Intravenous Once  . furosemide  40 mg Oral BID  . insulin aspart  0-15 Units Subcutaneous TID WC  . insulin aspart  0-5 Units Subcutaneous QHS  . levETIRAcetam  2,000 mg Oral BID  . levothyroxine  50 mcg Oral QAC breakfast  . metoprolol tartrate  25 mg Oral BID  . pantoprazole  40 mg Oral Daily  . QUEtiapine Fumarate  150 mg Oral QHS  . rivaroxaban  20 mg Oral Q supper   Continuous Infusions:    Marinda Elk  [facility] Pager [phone]. If 8PM-8AM, please contact night-coverage at [email or web address], password [facility] 08/05/2013, 10:56 AM  LOS: 1 day

## 2013-08-05 NOTE — Evaluation (Signed)
Physical Therapy Evaluation Patient Details Name: Elizabeth Love MRN: [id number] DOB: [birth year] Today's Date: 08/05/2013 Time: 4098-1191 PT Time Calculation (min): 16 min  PT Assessment / Plan / Recommendation History of Present Illness   pt is a 57 y.o. 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 Adm secodnary to acute on chronic CHF and HTN, recently discharged.   Clinical Impression  Pt adm due to above. Presents with decreased independence with mobility secondary to deficits listed below (see PT problem list). Pt to benefit from skilled PT to address deficits listed below and decrease caregiver burden. Pt very tearful and a poor historian throughout session. PLOF and family involvement information received from chart and nursing. Pt will need 24/7 caregiver support upon D/C. Pt declined OOB activities this session.     PT Assessment  Patient needs continued PT services    Follow Up Recommendations  Home health PT;Supervision/Assistance - 24 hour (pending family support; if not able to (A) will require SNF)    Does the patient have the potential to tolerate intense rehabilitation      Barriers to Discharge Other (comment) unclear of home enviroment; recieved info from nursing    Equipment Recommendations  Other (comment) (TBD)    Recommendations for Other Services OT consult   Frequency Min 3X/week    Precautions / Restrictions Precautions Precautions: Fall Restrictions Weight Bearing Restrictions: No   Pertinent Vitals/Pain Pt tearful; did not rate or c/o pain       Mobility  Bed Mobility Bed Mobility: Supine to Sit;Sitting - Scoot to Edge of Bed;Sit to Supine Supine to Sit: 5: Supervision;HOB flat;With rails Sitting - Scoot to Edge of Bed: 5: Supervision Sit to Supine: 4: Min assist;HOB flat Details for Bed  Mobility Assistance: pt required (A) to bring Rt LE onto bed secondary to decreased strength; max cues and encouragement for sequencing and hand placement  Transfers Transfers: Not assessed (pt tearful and refused to attempt SPT) Ambulation/Gait Ambulation/Gait Assistance: Not tested (comment) (pt stated "i dont walk") Stairs: No Wheelchair Mobility Wheelchair Mobility: No         PT Diagnosis: Difficulty walking;Generalized weakness  PT Problem List: Decreased strength;Decreased range of motion;Decreased activity tolerance;Decreased balance;Decreased mobility;Decreased cognition;Decreased safety awareness;Decreased knowledge of use of DME PT Treatment Interventions: DME instruction;Gait training;Functional mobility training;Therapeutic activities;Therapeutic exercise;Balance training;Neuromuscular re-education;Patient/family education     PT Goals(Current goals can be found in the care plan section) Acute Rehab PT Goals Patient Stated Goal: to call my son PT Goal Formulation: With patient Time For Goal Achievement: 08/19/13 Potential to Achieve Goals: Good  Visit Information  Last PT Received On: 08/05/13 Assistance Needed: +1       Prior Functioning  Home Living Family/patient expects to be discharged to:: Private residence Living Arrangements: Children;Other relatives Available Help at Discharge: Family;Available 24 hours/day Type of Home: House Home Access: Level entry Home Layout: Two level;Bed/bath upstairs Alternate Level Stairs-Number of Steps: flight Alternate Level Stairs-Rails: Right Home Equipment: Bedside commode;Cane - quad;Wheelchair - manual;Shower seat Additional Comments: pt is a poor historian  Prior Function Level of Independence: Needs assistance Gait / Transfers Assistance Needed: pt reports she stays in her wheelchair most of the time ADL's / Homemaking Assistance Needed: pt reports her son dresses her Comments: it is unclear as to how much pt is  able to do indepenently; no family present at this time  Communication Communication: Expressive difficulties;Receptive difficulties Dominant Hand: Left    Cognition  Cognition Arousal/Alertness: Awake/alert Behavior During Therapy: Anxious Overall Cognitive Status: Impaired/Different from baseline Area of Impairment: Following commands;Problem solving;Safety/judgement Following Commands: Follows one step commands inconsistently Safety/Judgement: Decreased awareness of safety;Decreased awareness of deficits Problem Solving: Slow processing;Decreased initiation;Difficulty sequencing;Requires verbal cues;Requires tactile cues Difficult to assess due to: Impaired communication    Extremity/Trunk Assessment Upper Extremity Assessment Upper Extremity Assessment: RUE deficits/detail RUE Deficits / Details: Rt UE limitied ROM and strength due to previous CVA  Lower Extremity Assessment Lower Extremity Assessment: RLE deficits/detail RLE Deficits / Details: Rt LE limited due to previous CVA Cervical / Trunk Assessment Cervical / Trunk Assessment: Normal   Balance Balance Balance Assessed: Yes Static Sitting Balance Static Sitting - Balance Support: Left upper extremity supported;Feet unsupported Static Sitting - Level of Assistance: 5: Stand by assistance Static Sitting - Comment/# of Minutes: pt tolerated sitting EOB ~4 min; became tearful and returned to supine   End of Session PT - End of Session Activity Tolerance: Other (comment) (pt tearful and anxious) Patient left: in bed;with call bell/phone within reach;with bed alarm set Nurse Communication: Mobility status  GP     Donell Sievert,  [phone] 08/05/2013, 2:30 PM

## 2013-08-06 DIAGNOSIS — I509 Heart failure, unspecified: Secondary | ICD-10-CM

## 2013-08-06 DIAGNOSIS — Z95810 Presence of automatic (implantable) cardiac defibrillator: Secondary | ICD-10-CM

## 2013-08-06 DIAGNOSIS — D6859 Other primary thrombophilia: Secondary | ICD-10-CM

## 2013-08-06 DIAGNOSIS — G40909 Epilepsy, unspecified, not intractable, without status epilepticus: Secondary | ICD-10-CM

## 2013-08-06 DIAGNOSIS — I6990 Unspecified sequelae of unspecified cerebrovascular disease: Secondary | ICD-10-CM

## 2013-08-06 DIAGNOSIS — I5022 Chronic systolic (congestive) heart failure: Secondary | ICD-10-CM

## 2013-08-06 DIAGNOSIS — I10 Essential (primary) hypertension: Secondary | ICD-10-CM

## 2013-08-06 LAB — GLUCOSE, CAPILLARY: Glucose-Capillary: 123 mg/dL — ABNORMAL HIGH (ref 70–99)

## 2013-08-06 MED ORDER — SPIRONOLACTONE 25 MG PO TABS: 12.5000 mg | ORAL_TABLET | Freq: Every day | ORAL | Status: DC

## 2013-08-06 MED ORDER — TRAMADOL HCL 50 MG PO TABS: 50.0000 mg | ORAL_TABLET | Freq: Four times a day (QID) | ORAL | Status: DC | PRN

## 2013-08-06 MED ORDER — LISINOPRIL 5 MG PO TABS: 2.5000 mg | ORAL_TABLET | Freq: Every day | ORAL | Status: DC

## 2013-08-06 MED ORDER — CYCLOBENZAPRINE HCL 5 MG PO TABS: 5.0000 mg | ORAL_TABLET | Freq: Every evening | ORAL | Status: DC | PRN

## 2013-08-06 NOTE — Progress Notes (Signed)
The patient is refusing medications; will continue to monitor patient. Lorretta Harp RN

## 2013-08-06 NOTE — Discharge Summary (Signed)
Physician Discharge Summary  Elizabeth Love XBJ:[id number] DOB: 02/22/1956 DOA: 08/04/2013  PCP: Estill Cotta, MD  Admit date: 08/04/2013 Discharge date: 08/06/2013  Time spent: >30 minutes  Recommendations for Outpatient Follow-up:  BMET renal function and electrolytes  Discharge Diagnoses:  Principal Problem:   Chest pain Active Problems:   Aphasia as late effect of cerebrovascular accident   Hypothyroidism   Seizure disorder   Protein C deficiency   Chronic pain   DM type 2 (diabetes mellitus, type 2)   Acute combined systolic and diastolic heart failure   Discharge Condition: Stable and improved. He still with mild discomfort on her chest but not on acute distress and non-cardiac in etiology. Patient will be discharged home with home health services.  Diet recommendation: Heart healthy diet (low sodium); modify carbohydrates diet.  Filed Weights   08/04/13 2017 08/05/13 0548 08/06/13 0638  Weight: 52.073 kg (114 lb 12.8 oz) 52 kg (114 lb 10.2 oz) 51.7 kg (113 lb 15.7 oz)    History of present illness:  57 y.o. female with Past medical history of coronary artery disease, atrial fibrillation, DVT, noncompliance, seizure, stroke coma diabetes.  The patient is coming from home.  The patient is presenting with the complaint of chest pain. Her chest pain has been ongoing since last few days. It is located on the left side although the pacemaker insertion site and she mentions that the pain gets worse when she takes a deep breath as well as movement. The pain felt like a sharp pain. She denies any similar pain in the past. She denies any fever, chills, cough, shortness of breath, nausea, abdominal pain, diarrhea, burning urination.  She complains that she has significant pain all over her right leg which is chronic for her.   Hospital Course:  Chest pain:  - Cardiac markers negative x 3. EKG unchanged from previous and no acute ischemic changes on her telemetry..   - pacemaker site exquisitely tender to palpation. 06/2013 s/p ICD Gen change; but pain in this area has proceed procedures in October 2014. -Following cardiology (electrophysiology service) recommendations will use Tylenol, low-dose Flexeril and also tramadol as needed for musculoskeletal discomfort. If pain persist we'll add intakes as analgesics to her pain regimen (initially avoided since the patient is on chronic xarelto) - CRP < 0.5   Chronic systolic and diastolic heart failure  -compensated currently -continue home medication regimen, strict I and O's and low sodium diet.  - No JVD or edema on her LE's - lungs clear to auscultation.  -Followup with cardiology in outpatient status for further medication adjustment.  DM type 2 (diabetes mellitus, type 2)  - cont  SSI and low carb diet  Protein C deficiency  - cont xarelto.   Depression: -continue Lexapro  Seizure disorder: -No seizure activity reported during this admission. -Continue home medication regimen.  Hypothyroidism: -Continue Synthroid  Atrial fibrillation -Currently in sinus rhythm -Continue digoxin, amiodarone, metoprolol and felt to  Physical deconditioning and medication compliance: -Will arrange home health services including PT, OT, nurse, social worker and home health aid.   Procedures:  See below for x-ray reports.  Last 2-D echo done on 07/22/2013 as demonstrated ejection fraction of 20%; no wall motion abnormalities and just a trivial pericardial effusion identified on the posterior aspect of the heart. Moderate tricuspid and mitral valve regurgitation  Consultations:  Cardiology (electrophysiologist)  Discharge Exam: Filed Vitals:   08/06/13 1030  BP: 124/74  Pulse: 77  Temp:  Resp:     General: Alert, awake and oriented x3; no acute distress. she still complains of some musculoskeletal discomfort around her ICD pocket site Cardiovascular: RRR, no rubs or gallops Respiratory: CTA  bilaterally Abdomen: Soft, active bowel sounds, no hepatomegaly or splenomegaly; no guarding or rebound on exam. Extremities: No edema, no cyanosis and no clubbing Neurologic exam: No new focal deficit appreciated. Patient baseline right side hemiparesis unchanged from previous CVA.  Discharge Instructions  Discharge Orders   Future Appointments Provider Department Dept Phone   10/14/2013 9:05 AM [facility] Rem[facility] [place] Office [phone]   Future Orders Complete By Expires   Discharge instructions  As directed    Comments:     Follow a low sodium diet Take medications as prescribed Arrange follow up with PCP in 10 days   Face-to-face encounter (required for Medicare/Medicaid patients)  As directed    Comments:     I Leshae Mcclay certify that this patient is under my care and that I, or a nurse practitioner or physician's assistant working with me, had a face-to-face encounter that meets the physician face-to-face encounter requirements with this patient on 08/06/2013. The encounter with the patient was in whole, or in part for the following medical condition(s) which is the primary reason for home health care (List medical condition): Heart failure and hx of stroke with right side residual deficit; patient with multiple admission; big concerns on medication compliance, home safety and assistance/support needs   Questions:     The encounter with the patient was in whole, or in part, for the following medical condition, which is the primary reason for home health care:  Heart failure and hx of stroke with right side residual deficit; patient with multiple admission; big concerns on medication compliance, home safety and assistance/support needs   I certify that, based on my findings, the following services are medically necessary home health services:  Nursing   Physical therapy   My clinical findings support the need for the above services:  Unable to leave home  safely without assistance and/or assistive device   Further, I certify that my clinical findings support that this patient is homebound due to:  Unable to leave home safely without assistance   Reason for Medically Necessary Home Health Services:  Skilled Nursing- Change/Decline in Patient Status   Skilled Nursing- Skilled Assessment/Observation   Therapy- Investment banker, operational, Patent examiner   Therapy- Home Adaptation to Facilitate Safety   Therapy- Instruction on Safe use of Assistive Devices for ADLs   Home Health  As directed    Questions:     To provide the following care/treatments:  PT   OT   RN   Home Health Aide   Social work       Medication List         acetaminophen 325 MG tablet  Commonly known as:  TYLENOL  Take 650 mg by mouth every 6 (six) hours as needed for pain.     albuterol (2.5 MG/3ML) 0.083% nebulizer solution  Commonly known as:  PROVENTIL  Take 2.5 mg by nebulization every 6 (six) hours as needed. For cough or wheeze     ALPRAZolam 0.5 MG tablet  Commonly known as:  XANAX  Take 0.5 mg by mouth at bedtime as needed for sleep.     amiodarone 400 MG tablet  Commonly known as:  PACERONE  Take 200 mg by mouth See admin instructions. Takes 200mg  on Mon, Tues, Wed,  Thurs and Fri's only (none on weekends)     cyclobenzaprine 5 MG tablet  Commonly known as:  FLEXERIL  Take 1 tablet (5 mg total) by mouth at bedtime as needed for muscle spasms.     digoxin 0.125 MG tablet  Commonly known as:  LANOXIN  Take 1 tablet (0.125 mg total) by mouth daily.     escitalopram 20 MG tablet  Commonly known as:  LEXAPRO  Take 20 mg by mouth daily.     furosemide 40 MG tablet  Commonly known as:  LASIX  Take 40 mg by mouth 2 (two) times daily.     hydrOXYzine 50 MG tablet  Commonly known as:  ATARAX/VISTARIL  Take 50 mg by mouth 3 (three) times daily as needed for anxiety.     insulin lispro 100 UNIT/ML injection  Commonly known as:  HUMALOG   Inject into the skin 3 (three) times daily before meals. On sliding scale     levETIRAcetam 1000 MG tablet  Commonly known as:  KEPPRA  Take 2,000 mg by mouth 2 (two) times daily.     levothyroxine 50 MCG tablet  Commonly known as:  SYNTHROID, LEVOTHROID  Take 50 mcg by mouth daily before breakfast.     lisinopril 5 MG tablet  Commonly known as:  PRINIVIL,ZESTRIL  Take 0.5 tablets (2.5 mg total) by mouth daily.     metolazone 2.5 MG tablet  Commonly known as:  ZAROXOLYN  Take 2.5 mg by mouth daily as needed. For fluid retention     metoprolol tartrate 25 MG tablet  Commonly known as:  LOPRESSOR  Take 25 mg by mouth 2 (two) times daily.     nystatin cream  Commonly known as:  MYCOSTATIN  Apply 1 application topically 2 (two) times daily.     omeprazole 20 MG capsule  Commonly known as:  PRILOSEC  Take 20 mg by mouth 2 (two) times daily.     oxyCODONE-acetaminophen 10-325 MG per tablet  Commonly known as:  PERCOCET  Take 1 tablet by mouth every 8 (eight) hours as needed for pain.     polyethylene glycol packet  Commonly known as:  MIRALAX / GLYCOLAX  Take 17 g by mouth daily as needed. For constipation     SEROQUEL XR 150 MG 24 hr tablet  Generic drug:  QUEtiapine Fumarate  Take 150 mg by mouth at bedtime.     spironolactone 25 MG tablet  Commonly known as:  ALDACTONE  Take 0.5 tablets (12.5 mg total) by mouth daily.     terazosin 2 MG capsule  Commonly known as:  HYTRIN  Take 2 mg by mouth at bedtime.     traMADol 50 MG tablet  Commonly known as:  ULTRAM  Take 1 tablet (50 mg total) by mouth every 6 (six) hours as needed for moderate pain (for pain not relief by tylenol).     Vitamin D (Ergocalciferol) 50000 UNITS Caps capsule  Commonly known as:  DRISDOL  Take 50,000 Units by mouth every 30 (thirty) days.     XARELTO 20 MG Tabs tablet  Generic drug:  Rivaroxaban  Take 20 mg by mouth daily.       Allergies  Allergen Reactions  . Penicillins   .  Sulfa Antibiotics   . Penicillins Rash  . Sulfa Antibiotics Rash       Follow-up Information   Follow up with [name], [name], MD. Schedule an appointment as soon as possible for a  visit in 10 days.   Specialty:  Internal Medicine   Contact information:   [address] [place] [address] [phone]        The results of significant diagnostics from this hospitalization (including imaging, microbiology, ancillary and laboratory) are listed below for reference.    Significant Diagnostic Studies: Dg Chest 2 View  08/04/2013   CLINICAL DATA:  Chest pain.  EXAM: CHEST  2 VIEW  COMPARISON:  07/19/2013.  FINDINGS: The pacer wires/ AICD are stable. The heart is enlarged but unchanged. There is central vascular congestion and increased interstitial markings but no overt pulmonary edema. No definite pleural effusions or focal infiltrates.  IMPRESSION: Cardiac enlargement and vascular congestion without overt pulmonary edema or pleural effusion.   Electronically Signed   By: Loralie Champagne M.D.   On: 08/04/2013 16:22   Dg Chest Port 1 View  07/21/2013   CLINICAL DATA:  Right IJ central line placement.  EXAM: PORTABLE CHEST - 1 VIEW  COMPARISON:  07/21/2013 at 8:57 a.m.  FINDINGS: There has been interval placement of a right IJ central venous catheter with tip at the origin of the cavoatrial junction. Left-sided pacemaker is unchanged. There has been improvement in very mild hazy bilateral perihilar opacification likely improving interstitial edema. Continued hazy density over the left base which may be due to combination atelectasis/effusion. Stable moderate cardiomegaly. No evidence of right-sided pneumothorax. Remainder of the exam is unchanged.  IMPRESSION: Interval improvement of minimal interstitial edema. Persistent hazy left base opacification likely a combination of small effusions/atelectasis.  Stable moderate cardiomegaly.  Right IJ central venous catheter with tip  at the origin of the cavoatrial junction. No pneumothorax.   Electronically Signed   By: Elberta Fortis M.D.   On: 07/21/2013 10:42   Dg Chest Portable 1 View  07/21/2013   CLINICAL DATA:  Shortness of breath. Respiratory distress.  EXAM: PORTABLE CHEST - 1 VIEW  COMPARISON:  None.  FINDINGS: Patient has left-sided AICD. Leads overlie the right atrium and right ventricle.  The heart is enlarged. There are patchy densities throughout the lungs bilaterally, consistent with interstitial infiltrates/edema. Work of opacity at the left lung base may indicate atelectasis or infiltrate.  IMPRESSION: 1. Cardiomegaly and interstitial infiltrate/edema. 2. Left lower lobe infiltrate or atelectasis.   Electronically Signed   By: Rosalie Gums M.D.   On: 07/21/2013 09:06   Dg Chest Port 1 View  07/19/2013   CLINICAL DATA:  Chest pain, history hypertension, asthma, seizures, diabetes, protein C deficiency  EXAM: PORTABLE CHEST - 1 VIEW  COMPARISON:  Portable exam 2150 hr compared to 05/31/2013  FINDINGS: Left subclavian transvenous AICD leads project over right atrium and right ventricle.  Enlargement of cardiac silhouette with pulmonary vascular congestion.  Perihilar infiltrates likely edema and since CHF.  No gross pleural effusion or pneumothorax.  Bones unremarkable.  IMPRESSION: Chronic CHF.   Electronically Signed   By: Ulyses Southward M.D.   On: 07/19/2013 23:22   Dg Abd Portable 1v  07/21/2013   CLINICAL DATA:  Abdominal pain and tenderness  EXAM: PORTABLE ABDOMEN - 1 VIEW  COMPARISON:  None.  FINDINGS: Normal bowel gas pattern. No evidence of obstruction.  There are surgical vascular clips in the right upper quadrant reflecting a prior cholecystectomy. A vena cava filter has its superior tip superimposed over the right lower aspect of L2. The soft tissues are otherwise unremarkable.  No acute bony abnormality.  IMPRESSION: No acute findings. No evidence of a  bowel obstruction.   Electronically Signed   By: Amie Portland M.D.   On: 07/21/2013 21:47    Microbiology: Recent Results (from the past 240 hour(s))  CULTURE, BLOOD (ROUTINE X 2)     Status: None   Collection Time    08/04/13  4:24 PM      Result Value Range Status   Specimen Description BLOOD RIGHT ANTECUBITAL   Final   Special Requests BOTTLES DRAWN AEROBIC AND ANAEROBIC 10CC   Final   Culture  Setup Time     Final   Value: 08/04/2013 22:44     Performed at [facility]   Culture     Final   Value:        BLOOD CULTURE RECEIVED NO GROWTH TO DATE CULTURE WILL BE HELD FOR 5 DAYS BEFORE ISSUING A FINAL NEGATIVE REPORT     Performed at [facility]   Report Status PENDING   Incomplete  CULTURE, BLOOD (ROUTINE X 2)     Status: None   Collection Time    08/04/13  4:35 PM      Result Value Range Status   Specimen Description BLOOD ARM LEFT   Final   Special Requests BOTTLES DRAWN AEROBIC AND ANAEROBIC 5CC   Final   Culture  Setup Time     Final   Value: 08/04/2013 22:44     Performed at [facility]   Culture     Final   Value:        BLOOD CULTURE RECEIVED NO GROWTH TO DATE CULTURE WILL BE HELD FOR 5 DAYS BEFORE ISSUING A FINAL NEGATIVE REPORT     Performed at [facility]   Report Status PENDING   Incomplete     Labs: Basic Metabolic Panel:  Recent Labs Lab 08/04/13 1508  NA 136  K 4.4  CL 102  CO2 22  GLUCOSE 134*  BUN 23  CREATININE 1.04  CALCIUM 9.5   CBC:  Recent Labs Lab 08/04/13 1508 08/05/13 1015  WBC 6.9 4.8  NEUTROABS 3.9 3.3  HGB 10.0* 9.7*  HCT 31.7* 31.8*  MCV 80.3 80.3  PLT 317 281   Cardiac Enzymes:  Recent Labs Lab 08/04/13 1508 08/04/13 1855 08/05/13 0125 08/05/13 0700 08/05/13 1015  TROPONINI <0.30 <0.30 <0.30 <0.30 <0.30   BNP: BNP (last 3 results)  Recent Labs  05/31/13 1730 07/21/13 0902 08/04/13 1508  PROBNP 4747.0* 9726.0* 4130.0*   CBG:  Recent Labs Lab 08/05/13 1153 08/05/13 1626 08/05/13 2208 08/06/13 0619 08/06/13 1116   GLUCAP 132* 110* 123* 118* 201*     Signed:  Erdem Naas  [facility] 08/06/2013, 1:46 PM

## 2013-08-06 NOTE — Progress Notes (Signed)
ELECTROPHYSIOLOGY ROUNDING NOTE    Patient Name: Elizabeth Love Date of Encounter: 08/06/2013    SUBJECTIVE:Patient not very communicative this morning.  Does nod her head that she is still having pain at ICD site  Cardiac enzymes remained negative, WBC not elevated, cultures negative to date.   TELEMETRY: Reviewed telemetry pt in sinus rhythm with occasional atrial pacing Filed Vitals:   08/05/13 1712 08/05/13 2023 08/05/13 2235 08/06/13 0638  BP: 107/69 97/57 102/61 104/69  Pulse: 63 69 68 77  Temp: 97.9 F (36.6 C) 97.8 F (36.6 C)  97.8 F (36.6 C)  TempSrc: Oral Oral  Oral  Resp: 18 16  18   Height:      Weight:    113 lb 15.7 oz (51.7 kg)  SpO2: 98% 95%  100%    Intake/Output Summary (Last 24 hours) at 08/06/13 0703 Last data filed at 08/06/13 0529  Gross per 24 hour  Intake    360 ml  Output   1003 ml  Net   -643 ml    CURRENT MEDICATIONS: . amiodarone  200 mg Oral Custom  . digoxin  0.125 mg Oral Daily  . escitalopram  20 mg Oral Daily  . furosemide  40 mg Oral BID  . insulin aspart  0-15 Units Subcutaneous TID WC  . insulin aspart  0-5 Units Subcutaneous QHS  . levETIRAcetam  2,000 mg Oral BID  . levothyroxine  50 mcg Oral QAC breakfast  . metoprolol tartrate  25 mg Oral BID  . pantoprazole  40 mg Oral Daily  . QUEtiapine Fumarate  150 mg Oral QHS  . rivaroxaban  20 mg Oral Q supper    LABS: Basic Metabolic Panel:  Recent Labs  91/47/82 1508  NA 136  K 4.4  CL 102  CO2 22  GLUCOSE 134*  BUN 23  CREATININE 1.04  CALCIUM 9.5   CBC:  Recent Labs  08/04/13 1508 08/05/13 1015  WBC 6.9 4.8  NEUTROABS 3.9 3.3  HGB 10.0* 9.7*  HCT 31.7* 31.8*  MCV 80.3 80.3  PLT 317 281   Cardiac Enzymes:  Recent Labs  08/05/13 0125 08/05/13 0700 08/05/13 1015  TROPONINI <0.30 <0.30 <0.30    Radiology/Studies:  Dg Chest 2 View 08/04/2013   CLINICAL DATA:  Chest pain.  EXAM: CHEST  2 VIEW  COMPARISON:  07/19/2013.  FINDINGS: The pacer wires/  AICD are stable. The heart is enlarged but unchanged. There is central vascular congestion and increased interstitial markings but no overt pulmonary edema. No definite pleural effusions or focal infiltrates.  IMPRESSION: Cardiac enlargement and vascular congestion without overt pulmonary edema or pleural effusion.   Electronically Signed   By: Loralie Champagne M.D.   On: 08/04/2013 16:22   PHYSICAL EXAM Well developed and nourished in no acute distress HENT normal Neck supplev [place] with tenderness  There is also circumferential tenderness that extends to the sternum Regular rate and rhythm, no murmurs or gallops Abd-soft with active BS without hepatomegaly No Clubbing cyanosis edema Skin-warm and dry A & Oriented expressive aphasia   DEVICE INTERROGATION: Device interrogated by industry 08-05-2013.  Normal device function. See paper chart.   Principal Problem:   Chest pain Active Problems:   Aphasia as late effect of cerebrovascular accident   Hypothyroidism   Seizure disorder   Protein C deficiency   Chronic pain   DM type 2 (diabetes mellitus, type 2)   Acute combined systolic and diastolic heart failure   Nothing at  this point for device apart from nonaddicting analgesics, tylenol or NSAIDs Would like to try alphabet board to facilitate communication

## 2013-08-06 NOTE — Progress Notes (Signed)
Patient states that she can not read or write words.  Unsure if this just because she is frustrated about having difficulty communicating or if she really can not read and write words.

## 2013-08-06 NOTE — Care Management Note (Signed)
    Page 1 of 2   08/06/2013     11:35:21 AM   CARE MANAGEMENT NOTE 08/06/2013  Patient:  Elizabeth Love, Elizabeth Love   Account Number:  [id number]  Date Initiated:  08/06/2013  Documentation initiated by:  Oletta Cohn  Subjective/Objective Assessment:   57 y.o. female with Past medical history of coronary artery disease, atrial fibrillation, DVT, noncompliance, seizure, stroke coma diabetes.//home with son     Action/Plan:   obtain serial troponins as well as monitor her on telemetry.//home with home health   Anticipated DC Date:  08/06/2013   Anticipated DC Plan:  HOME W HOME HEALTH SERVICES      DC Planning Services  CM consult      [facility] Choice  HOME HEALTH   Choice offered to / List presented to:  C-1 Patient        HH arranged  HH-1 RN  HH-10 DISEASE MANAGEMENT  HH-2 PT  HH-3 OT  HH-6 SOCIAL WORKER      HH agency  [facility]   Status of service:  Completed, signed off Medicare Important Message given?   (If response is "NO", the following Medicare IM given date fields will be blank) Date Medicare IM given:   Date Additional Medicare IM given:    Discharge Disposition:    Per UR Regulation:    If discussed at Long Length of Stay Meetings, dates discussed:    Comments:  08/06/13 1100 Camellia Wood, RN, BSN, [employer] [phone] NCM spoke with patient concerning discharge planning. Pt offered choice for [facility] for [facility] services upon discharge. Per pt choice [facility] to provide [facility] services.  [facility] rep Kizzie Furnish, RN contacted concerning new referral. Pt to discharge home with son. Pt request HHRN/PT/OT/SW for disease management upon discharge. No DME needs identified at this time.

## 2013-08-06 NOTE — Progress Notes (Signed)
I cosign Elizabeth Love's (student nurse) assessment, I&O, care plan, education, and med administration.    

## 2013-08-10 LAB — CULTURE, BLOOD (ROUTINE X 2)
Culture: NO GROWTH
Culture: NO GROWTH

## 2013-08-18 ENCOUNTER — Emergency Department (HOSPITAL_COMMUNITY)
Admission: EM | Admit: 2013-08-18 | Discharge: 2013-08-18 | Disposition: A | Discharge status: Home / Self Care | Payer: Medicare Other | Attending: Emergency Medicine | Admitting: Emergency Medicine

## 2013-08-18 ENCOUNTER — Emergency Department (HOSPITAL_COMMUNITY): Payer: Medicare Other

## 2013-08-18 ENCOUNTER — Encounter (HOSPITAL_COMMUNITY): Payer: Self-pay | Admitting: Emergency Medicine

## 2013-08-18 DIAGNOSIS — Z91199 Patient's noncompliance with other medical treatment and regimen due to unspecified reason: Secondary | ICD-10-CM | POA: Insufficient documentation

## 2013-08-18 DIAGNOSIS — I10 Essential (primary) hypertension: Secondary | ICD-10-CM | POA: Insufficient documentation

## 2013-08-18 DIAGNOSIS — I4891 Unspecified atrial fibrillation: Secondary | ICD-10-CM | POA: Insufficient documentation

## 2013-08-18 DIAGNOSIS — E119 Type 2 diabetes mellitus without complications: Secondary | ICD-10-CM | POA: Insufficient documentation

## 2013-08-18 DIAGNOSIS — R51 Headache: Secondary | ICD-10-CM | POA: Diagnosis not present

## 2013-08-18 DIAGNOSIS — Z794 Long term (current) use of insulin: Secondary | ICD-10-CM | POA: Insufficient documentation

## 2013-08-18 DIAGNOSIS — F919 Conduct disorder, unspecified: Secondary | ICD-10-CM | POA: Insufficient documentation

## 2013-08-18 DIAGNOSIS — G40909 Epilepsy, unspecified, not intractable, without status epilepticus: Secondary | ICD-10-CM | POA: Insufficient documentation

## 2013-08-18 DIAGNOSIS — Z95810 Presence of automatic (implantable) cardiac defibrillator: Secondary | ICD-10-CM | POA: Insufficient documentation

## 2013-08-18 DIAGNOSIS — E039 Hypothyroidism, unspecified: Secondary | ICD-10-CM | POA: Diagnosis not present

## 2013-08-18 DIAGNOSIS — Z79899 Other long term (current) drug therapy: Secondary | ICD-10-CM | POA: Insufficient documentation

## 2013-08-18 DIAGNOSIS — R071 Chest pain on breathing: Secondary | ICD-10-CM | POA: Diagnosis not present

## 2013-08-18 DIAGNOSIS — Z9119 Patient's noncompliance with other medical treatment and regimen: Secondary | ICD-10-CM | POA: Insufficient documentation

## 2013-08-18 DIAGNOSIS — R5381 Other malaise: Secondary | ICD-10-CM | POA: Diagnosis not present

## 2013-08-18 DIAGNOSIS — Z86718 Personal history of other venous thrombosis and embolism: Secondary | ICD-10-CM | POA: Insufficient documentation

## 2013-08-18 DIAGNOSIS — Z880 Allergy status to penicillin: Secondary | ICD-10-CM | POA: Insufficient documentation

## 2013-08-18 DIAGNOSIS — G8929 Other chronic pain: Secondary | ICD-10-CM | POA: Insufficient documentation

## 2013-08-18 DIAGNOSIS — J45901 Unspecified asthma with (acute) exacerbation: Secondary | ICD-10-CM | POA: Insufficient documentation

## 2013-08-18 DIAGNOSIS — Z7901 Long term (current) use of anticoagulants: Secondary | ICD-10-CM | POA: Insufficient documentation

## 2013-08-18 DIAGNOSIS — Z8639 Personal history of other endocrine, nutritional and metabolic disease: Secondary | ICD-10-CM | POA: Insufficient documentation

## 2013-08-18 DIAGNOSIS — Z87891 Personal history of nicotine dependence: Secondary | ICD-10-CM | POA: Insufficient documentation

## 2013-08-18 DIAGNOSIS — R079 Chest pain, unspecified: Secondary | ICD-10-CM | POA: Diagnosis present

## 2013-08-18 DIAGNOSIS — Z8673 Personal history of transient ischemic attack (TIA), and cerebral infarction without residual deficits: Secondary | ICD-10-CM | POA: Insufficient documentation

## 2013-08-18 DIAGNOSIS — I2510 Atherosclerotic heart disease of native coronary artery without angina pectoris: Secondary | ICD-10-CM | POA: Insufficient documentation

## 2013-08-18 DIAGNOSIS — I5022 Chronic systolic (congestive) heart failure: Secondary | ICD-10-CM | POA: Insufficient documentation

## 2013-08-18 MED ORDER — OXYCODONE-ACETAMINOPHEN 5-325 MG PO TABS: 1.0000 | ORAL_TABLET | ORAL | Status: DC | PRN

## 2013-08-18 MED ORDER — HYDROMORPHONE HCL PF 1 MG/ML IJ SOLN: 0.5000 mg | Freq: Once | INTRAMUSCULAR | Status: DC

## 2013-08-18 MED ORDER — HYDROMORPHONE HCL PF 1 MG/ML IJ SOLN
0.5000 mg | Freq: Once | INTRAMUSCULAR | Status: AC
  Administered 2013-08-18: 0.5 mg via INTRAMUSCULAR
  Filled 2013-08-18: qty 1

## 2013-08-18 NOTE — ED Notes (Addendum)
Spoke with travis Klaas. He states to call an ambulance to take pt home. States his grandmother is at home to receive her . ptr called. Lunch ordered

## 2013-08-18 NOTE — ED Provider Notes (Signed)
CSN: [id number]     Arrival date & time 08/18/13  9147 History   First MD Initiated Contact with Patient 08/18/13 0710     Chief Complaint  Patient presents with  . Headache  . Chest Pain    near pacemaker site   Patient is a 57 y.o. female presenting with headaches and chest pain.  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  At that time, she was admitted to hospitalist service, discharged on 11/19 with pain thought due to defib placement; patient given Tylenol, tramadol, and flexeril for pain control.     Chest pain is in upper left chest localized to defib site, constant, non-radiating, non-exertional.  Area is tender but has not been red.  Nothing improves pain.   Associated with headache, shortness of breath (chronic), right sided weakness (chronic 2/2 CVA).  Denies abdominal pain, back pain, leg swelling.    Past Medical History  Diagnosis Date  . Seizures   . Hypertension   . Stroke     2007 - R sided weakness and expressive aphasia with h/o behavioral problems related to this  . Asthma   . Diabetes mellitus   . Chronic systolic CHF (congestive heart failure)   . Atrial fibrillation     a. On Xarelto after having DVT (prev felt to be poor anticoag cand 2/2 noncompliance).  . History of DVT (deep vein thrombosis)     a. 06/2012: RLE DVT, + protein C deficiency, placed on Xarelto. s/p IVC  filter  . Noncompliance     Due to mental status, family has had to coax her to take medicines  . Hypothyroidism   . VT (ventricular tachycardia)     a. h/o ICD ~2007-2008. b. Adm 08/2012 with UTI, had VT/ICD shock x 5 (hypokalemic);  c. 06/2013 s/p ICD Gen change: SJM DC ICD ser #: [id number]  . Protein C deficiency   . NICM (nonischemic cardiomyopathy)     a. s/p AICD ~2007 ([employer]) - previous care [place]. No known hx CAD. b. EF 10% by echo 04/2012;  c. 06/2013 s/p ICD Gen change: SJM DC ICD ser #: [id number]  . 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 Cholecystectomy    . Appendectomy    . Pacemaker insertion    . Vena cava filter placement  07/19/2012    Procedure: INSERTION VENA-CAVA FILTER;  Surgeon: Larina Earthly, MD;  Location: [facility] OR;  Service: Vascular;  Laterality: [name];  . Insert / replace / remove pacemaker      ICD  . Abdominal hysterectomy     Family History  Problem Relation Age of Onset  . Hypertension Mother   . Coronary artery disease Neg Hx    History  Substance Use Topics  . Smoking status: Former Games developer  . Smokeless tobacco: Never Used  . Alcohol Use:  No   OB History   Grav Para Term Preterm Abortions TAB SAB Ect Mult Living                 Review of Systems  Constitutional: Negative for fever, activity change and appetite change.  Eyes: Negative for visual disturbance.  Respiratory: Positive for shortness of breath. Negative for chest tightness.   Cardiovascular: Positive for chest pain. Negative for leg swelling.  Gastrointestinal: Negative for nausea, vomiting, abdominal pain, constipation and blood in stool.  Genitourinary: Negative for difficulty urinating.  Musculoskeletal: Negative for back pain.  Neurological: Positive for weakness and headaches. Negative for dizziness.  Psychiatric/Behavioral: Positive for behavioral problems.   Review of Systems  Constitutional: Negative for fever,  activity change and appetite change.  Eyes: Negative for visual disturbance.  Respiratory: Positive for shortness of breath. Negative for chest tightness.   Cardiovascular: Positive for chest pain. Negative for leg swelling.  Gastrointestinal: Negative for nausea, vomiting, abdominal pain, constipation and blood in stool.  Genitourinary: Negative for difficulty urinating.  Musculoskeletal: Negative for back pain.  Neurological: Positive for weakness and headaches. Negative for dizziness.  Psychiatric/Behavioral: Positive for behavioral problems.    Allergies  Penicillins; Sulfa antibiotics; Penicillins; and Sulfa antibiotics  Home Medications   Current Outpatient Rx  Name  Route  Sig  Dispense  Refill  . acetaminophen (TYLENOL) 325 MG tablet   Oral   Take 650 mg by mouth every 6 (six) hours as needed for pain.         Marland Kitchen albuterol (PROVENTIL) (2.5 MG/3ML) 0.083% nebulizer solution   Nebulization   Take 2.5 mg by nebulization every 6 (six) hours as needed. For cough or wheeze         . ALPRAZolam (XANAX) 0.5 MG tablet   Oral   Take 0.5 mg by mouth at bedtime as needed for sleep.         Marland Kitchen amiodarone (PACERONE) 400 MG tablet   Oral   Take 200 mg by mouth See admin instructions. Takes 200mg  on Mon, Tues, Wed, Thurs and Fri's only (none on weekends)         . cyclobenzaprine (FLEXERIL) 5 MG tablet   Oral   Take 1 tablet (5 mg total) by mouth at bedtime as needed for muscle spasms.   20 tablet   0   . digoxin (LANOXIN) 0.125 MG tablet   Oral   Take 1 tablet (0.125 mg total) by mouth daily.   30 tablet   0   . escitalopram (LEXAPRO) 20 MG tablet   Oral   Take 20 mg by mouth daily.         . furosemide (LASIX) 40 MG tablet   Oral   Take 40 mg by mouth 2 (two) times daily.         . hydrOXYzine (ATARAX/VISTARIL) 50 MG tablet   Oral   Take 50 mg by mouth 3 (three) times daily as needed for anxiety.          . insulin lispro (HUMALOG) 100 UNIT/ML  injection   Subcutaneous   Inject into the skin 3 (three) times daily before meals. On sliding scale         . levETIRAcetam (KEPPRA) 1000 MG tablet   Oral   Take 2,000 mg by mouth 2 (two) times daily.         Marland Kitchen levothyroxine (SYNTHROID, LEVOTHROID) 50 MCG tablet   Oral   Take 50 mcg by mouth daily before  breakfast.         . lisinopril (PRINIVIL,ZESTRIL) 5 MG tablet   Oral   Take 0.5 tablets (2.5 mg total) by mouth daily.         . metolazone (ZAROXOLYN) 2.5 MG tablet   Oral   Take 2.5 mg by mouth daily as needed. For fluid retention         . metoprolol tartrate (LOPRESSOR) 25 MG tablet   Oral   Take 25 mg by mouth 2 (two) times daily.         Marland Kitchen nystatin cream (MYCOSTATIN)   Topical   Apply 1 application topically 2 (two) times daily.         Marland Kitchen omeprazole (PRILOSEC) 20 MG capsule   Oral   Take 20 mg by mouth 2 (two) times daily.         Marland Kitchen oxyCODONE-acetaminophen (PERCOCET) 10-325 MG per tablet   Oral   Take 1 tablet by mouth every 8 (eight) hours as needed for pain.         . polyethylene glycol (MIRALAX / GLYCOLAX) packet   Oral   Take 17 g by mouth daily as needed. For constipation         . QUEtiapine Fumarate (SEROQUEL XR) 150 MG 24 hr tablet   Oral   Take 150 mg by mouth at bedtime.         . Rivaroxaban (XARELTO) 20 MG TABS tablet   Oral   Take 20 mg by mouth daily.         Marland Kitchen spironolactone (ALDACTONE) 25 MG tablet   Oral   Take 0.5 tablets (12.5 mg total) by mouth daily.         Marland Kitchen terazosin (HYTRIN) 2 MG capsule   Oral   Take 2 mg by mouth at bedtime.         . traMADol (ULTRAM) 50 MG tablet   Oral   Take 1 tablet (50 mg total) by mouth every 6 (six) hours as needed for moderate pain (for pain not relief by tylenol).   45 tablet   0   . Vitamin D, Ergocalciferol, (DRISDOL) 50000 UNITS CAPS capsule   Oral   Take 50,000 Units by mouth every 30 (thirty) days.          BP 147/93  Pulse 86  Temp(Src) 97.8 F (36.6  C) (Oral)  Resp 34  SpO2 99% Physical Exam  Constitutional: She is oriented to person, place, and time. She appears well-developed. She appears distressed.  HENT:  Head: Normocephalic and atraumatic.  Eyes: Conjunctivae and EOM are normal. No scleral icterus.  Neck: Normal range of motion. Neck supple.  Cardiovascular: Normal rate and regular rhythm.  Exam reveals no gallop and no friction rub.   No murmur heard. Pulmonary/Chest: Effort normal and breath sounds normal. No respiratory distress. She exhibits tenderness.  Abdominal: Soft. Bowel sounds are normal. There is no tenderness.  Musculoskeletal: She exhibits no edema.  Neurological: She is alert and oriented to person, place, and time. No cranial nerve deficit.  Skin: Skin is warm and dry. No erythema.    ED Course  Procedures (including critical care time) Labs Review Labs Reviewed  TROPONIN I   Imaging Review Dg Chest 2 View  08/18/2013   CLINICAL DATA:  Chest pain  EXAM: CHEST  2 VIEW  COMPARISON:  August 04, 2013  FINDINGS: There is diffuse cardiomegaly with pulmonary venous hypertension. There is upper lobe interstitial edema, stable. Pacemaker  leads are attached to the right atrium and middle cardiac vein region, stable. No pneumothorax. No adenopathy.  No new opacity.  IMPRESSION: Congestive heart failure. Slight interstitial edema in the upper lobes. No consolidation. Stable diffuse cardiac enlargement. Underlying pericardial effusion cannot be excluded radiographically.   Electronically Signed   By: Bretta Bang M.D.   On: 08/18/2013 09:24    EKG Interpretation    Date/Time:  Monday August 18 2013 07:44:36 EST Ventricular Rate:  87 PR Interval:  232 QRS Duration: 131 QT Interval:  441 QTC Calculation: 531 R Axis:   67 Text Interpretation:  Sinus rhythm Prolonged PR interval Probable left atrial enlargement Left bundle branch block No significant change since last tracing Confirmed by SHELDON  MD, CHARLES  ([id number]) on 08/18/2013 7:47:41 AM            MDM  Atypical chest pain- similar presentation to prior, pain reproducible with palpation though patient would not permit much examination, jumping and pushing examiner away even with stethoscope placement on right side of chest (expect behavioral component).  Defibrillator site clean dry and intact without erythema or discharge.  EKG degraded due to motion but sinus rhythm, no ST or T wave changes and no changes from prior. Troponin x 1 negative, CXR unchanged from prior.  Afebrile, little to no concern for infection as dibfibrillator site clean dry and intact without erythema, edema, or discharge.  Pain control with dilaudid 0.5 mg IM once (RN could not get IV).   Called PCP (Dr. Kathrynn Speed) office, RN who stated patient has not been seen since 10/2 as she has no showed for several appointments.  At that visit, prescriptions for percocet #90 and Xanax #30 were given, no refills since given lack of follow-up.  Also noted behavioral issues in past as well as medication non-adherence possibly due to family not picking up patient's prescriptions at pharmacy (Keppra levels have been undetectable at previous hospitalizations).  Will discharge patient with oxycodone 5-325 mg #10.  Patient should follow-up with PCP as outpatient.      Rocco Serene, MD 08/18/13 1610  Rocco Serene, MD 08/18/13 1004

## 2013-08-18 NOTE — ED Notes (Signed)
Pt has wet her diaper. Cleaned and changed diaper

## 2013-08-18 NOTE — ED Provider Notes (Signed)
I saw and evaluated the patient, reviewed the resident's note and I agree with the findings and plan.  EKG Interpretation    Date/Time:  Monday August 18 2013 07:44:36 EST Ventricular Rate:  87 PR Interval:  232 QRS Duration: 131 QT Interval:  441 QTC Calculation: 531 R Axis:   67 Text Interpretation:  Sinus rhythm Prolonged PR interval Probable left atrial enlargement Left bundle branch block No significant change since last tracing Confirmed by SHELDON  MD, CHARLES ([id number]) on 08/18/2013 7:47:41 AM            Pt with chronic chest wall pain at the site of her AICD ongoing for months and preceding recent generator change. She was admitted for same about 2 weeks ago with neg workup. She is complaining of pain to that area as well as diffuse headache. She is a difficult historian due to aphasia from prior stroke, but able to answer yes/no questions. She was previously on chronic narcotics from her PCP, Dr. Kathrynn Speed, in [facility] but last Rx was about 2 months ago. She does not know why he stopped those meds, currently has none at home.   Charles B. Bernette Mayers, MD 08/18/13 1003

## 2013-08-18 NOTE — ED Notes (Signed)
IV team called. Pt placed on list.

## 2013-08-18 NOTE — ED Notes (Signed)
Pt to Pod C to await ambulance.

## 2013-08-18 NOTE — ED Notes (Signed)
Pt attempting to have BM. Pt is moaning continually.According to previous RN this is baseline behavior,.

## 2013-08-18 NOTE — ED Notes (Signed)
Unable to locate pt's family at either number given, [facility] can not transport pt to her home unless family is there to receive pt. Per Lequita Halt RN pt can be taken to Pod C until family is located. Report given to [facility]

## 2013-08-18 NOTE — ED Notes (Signed)
Patient states headache and chest pain in pacemaker site, patient with history of chronic pain and anxiety, patient with stroke history and is hard to answer questions

## 2013-08-18 NOTE — ED Notes (Signed)
ptr has arrived to transport pt to her home.

## 2013-08-18 NOTE — ED Notes (Addendum)
Patient transported to X-ray 

## 2013-08-18 NOTE — ED Notes (Signed)
MD, Bernette Mayers, at bedside.

## 2013-08-26 ENCOUNTER — Encounter (HOSPITAL_COMMUNITY): Payer: Self-pay | Admitting: Emergency Medicine

## 2013-08-26 ENCOUNTER — Emergency Department (HOSPITAL_COMMUNITY)
Admission: EM | Admit: 2013-08-26 | Discharge: 2013-08-26 | Disposition: A | Discharge status: Home / Self Care | Payer: Medicare Other | Attending: Emergency Medicine | Admitting: Emergency Medicine

## 2013-08-26 ENCOUNTER — Emergency Department (HOSPITAL_COMMUNITY): Payer: Medicare Other

## 2013-08-26 DIAGNOSIS — I4891 Unspecified atrial fibrillation: Secondary | ICD-10-CM | POA: Insufficient documentation

## 2013-08-26 DIAGNOSIS — Z91199 Patient's noncompliance with other medical treatment and regimen due to unspecified reason: Secondary | ICD-10-CM | POA: Insufficient documentation

## 2013-08-26 DIAGNOSIS — I10 Essential (primary) hypertension: Secondary | ICD-10-CM | POA: Insufficient documentation

## 2013-08-26 DIAGNOSIS — Z86718 Personal history of other venous thrombosis and embolism: Secondary | ICD-10-CM | POA: Insufficient documentation

## 2013-08-26 DIAGNOSIS — E119 Type 2 diabetes mellitus without complications: Secondary | ICD-10-CM | POA: Insufficient documentation

## 2013-08-26 DIAGNOSIS — Z9119 Patient's noncompliance with other medical treatment and regimen: Secondary | ICD-10-CM | POA: Insufficient documentation

## 2013-08-26 DIAGNOSIS — I2510 Atherosclerotic heart disease of native coronary artery without angina pectoris: Secondary | ICD-10-CM | POA: Insufficient documentation

## 2013-08-26 DIAGNOSIS — R071 Chest pain on breathing: Secondary | ICD-10-CM | POA: Insufficient documentation

## 2013-08-26 DIAGNOSIS — Z8673 Personal history of transient ischemic attack (TIA), and cerebral infarction without residual deficits: Secondary | ICD-10-CM | POA: Insufficient documentation

## 2013-08-26 DIAGNOSIS — I5022 Chronic systolic (congestive) heart failure: Secondary | ICD-10-CM | POA: Insufficient documentation

## 2013-08-26 DIAGNOSIS — Z87891 Personal history of nicotine dependence: Secondary | ICD-10-CM | POA: Insufficient documentation

## 2013-08-26 DIAGNOSIS — E039 Hypothyroidism, unspecified: Secondary | ICD-10-CM | POA: Insufficient documentation

## 2013-08-26 DIAGNOSIS — G40909 Epilepsy, unspecified, not intractable, without status epilepticus: Secondary | ICD-10-CM | POA: Insufficient documentation

## 2013-08-26 DIAGNOSIS — Z794 Long term (current) use of insulin: Secondary | ICD-10-CM | POA: Insufficient documentation

## 2013-08-26 DIAGNOSIS — Z862 Personal history of diseases of the blood and blood-forming organs and certain disorders involving the immune mechanism: Secondary | ICD-10-CM | POA: Insufficient documentation

## 2013-08-26 DIAGNOSIS — J45909 Unspecified asthma, uncomplicated: Secondary | ICD-10-CM | POA: Insufficient documentation

## 2013-08-26 DIAGNOSIS — Z95810 Presence of automatic (implantable) cardiac defibrillator: Secondary | ICD-10-CM | POA: Insufficient documentation

## 2013-08-26 DIAGNOSIS — I472 Ventricular tachycardia, unspecified: Secondary | ICD-10-CM | POA: Insufficient documentation

## 2013-08-26 DIAGNOSIS — Z880 Allergy status to penicillin: Secondary | ICD-10-CM | POA: Insufficient documentation

## 2013-08-26 DIAGNOSIS — Z87448 Personal history of other diseases of urinary system: Secondary | ICD-10-CM | POA: Insufficient documentation

## 2013-08-26 DIAGNOSIS — R059 Cough, unspecified: Secondary | ICD-10-CM | POA: Insufficient documentation

## 2013-08-26 DIAGNOSIS — R05 Cough: Secondary | ICD-10-CM | POA: Insufficient documentation

## 2013-08-26 DIAGNOSIS — Z7901 Long term (current) use of anticoagulants: Secondary | ICD-10-CM | POA: Insufficient documentation

## 2013-08-26 DIAGNOSIS — Z79899 Other long term (current) drug therapy: Secondary | ICD-10-CM | POA: Insufficient documentation

## 2013-08-26 DIAGNOSIS — R0789 Other chest pain: Secondary | ICD-10-CM

## 2013-08-26 DIAGNOSIS — Z950 Presence of cardiac pacemaker: Secondary | ICD-10-CM | POA: Insufficient documentation

## 2013-08-26 DIAGNOSIS — I4729 Other ventricular tachycardia: Secondary | ICD-10-CM | POA: Insufficient documentation

## 2013-08-26 DIAGNOSIS — J00 Acute nasopharyngitis [common cold]: Secondary | ICD-10-CM | POA: Insufficient documentation

## 2013-08-26 LAB — CBC WITH DIFFERENTIAL/PLATELET
Eosinophils Absolute: 0.1 10*3/uL (ref 0.0–0.7)
HCT: 28.3 % — ABNORMAL LOW (ref 36.0–46.0)
Hemoglobin: 8.5 g/dL — ABNORMAL LOW (ref 12.0–15.0)
Lymphocytes Relative: 21 % (ref 12–46)
MCH: 24.8 pg — ABNORMAL LOW (ref 26.0–34.0)
MCHC: 30 g/dL (ref 30.0–36.0)
MCV: 82.5 fL (ref 78.0–100.0)
Monocytes Absolute: 0.7 10*3/uL (ref 0.1–1.0)
Monocytes Relative: 11 % (ref 3–12)
Neutro Abs: 4 10*3/uL (ref 1.7–7.7)
RDW: 19.3 % — ABNORMAL HIGH (ref 11.5–15.5)
WBC: 6.1 10*3/uL (ref 4.0–10.5)

## 2013-08-26 LAB — BASIC METABOLIC PANEL
BUN: 17 mg/dL (ref 6–23)
CO2: 25 mEq/L (ref 19–32)
Chloride: 102 mEq/L (ref 96–112)
Creatinine, Ser: 0.94 mg/dL (ref 0.50–1.10)
GFR calc Af Amer: 78 mL/min — ABNORMAL LOW (ref >90)

## 2013-08-26 LAB — POCT I-STAT TROPONIN I: Troponin i, poc: 0.07 ng/mL (ref 0.00–0.08)

## 2013-08-26 MED ORDER — FUROSEMIDE 20 MG PO TABS
40.0000 mg | ORAL_TABLET | Freq: Once | ORAL | Status: AC
  Administered 2013-08-26: 40 mg via ORAL
  Filled 2013-08-26: qty 2

## 2013-08-26 MED ORDER — FUROSEMIDE 20 MG PO TABS: 20.0000 mg | ORAL_TABLET | Freq: Once | ORAL | Status: DC

## 2013-08-26 MED ORDER — ASPIRIN EC 325 MG PO TBEC
325.0000 mg | DELAYED_RELEASE_TABLET | Freq: Once | ORAL | Status: AC
  Administered 2013-08-26: 325 mg via ORAL
  Filled 2013-08-26: qty 1

## 2013-08-26 NOTE — ED Provider Notes (Signed)
CSN: [id number]     Arrival date & time 08/26/13  1452 History   First MD Initiated Contact with Patient 08/26/13 1514     Chief Complaint  Patient presents with  . Chest Pain   (Consider location/radiation/quality/duration/timing/severity/associated sxs/prior Treatment) HPI  Chief complaint: Chest pain next  Patient is a 57 year old female past medical history significant for stroke with right-sided weakness and expressive aphasia, diabetes, CHF, CAD, ventricular tachycardia status Naomii Kreger ICD placement comes in with chief complaint chest pain. Patient reports having pain located over defibrillator. This is been present for several days. Is unchanged. Describes this as a 10 out of 10 pain. Unable to qualify pain. Patient was seen recently and determine this was chest wall pain. She has been noncompliant with medications given for chest wall pain. Patient does endorse some cough and cold symptoms recently. However unable to give further information (expressive aphasia). Patient also concerned defibrillator may have gone off this afternoon.  Past Medical History  Diagnosis Date  . Seizures   . Hypertension   . Stroke     2007 - R sided weakness and expressive aphasia with h/o behavioral problems related to this  . Asthma   . Diabetes mellitus   . Chronic systolic CHF (congestive heart failure)   . Atrial fibrillation     a. On Xarelto after having DVT (prev felt to be poor anticoag cand 2/2 noncompliance).  . History of DVT (deep vein thrombosis)     a. 06/2012: RLE DVT, + protein C deficiency, placed on Xarelto. s/p IVC filter  . Noncompliance     Due to mental status, family has had to coax her to take medicines  . Hypothyroidism   . VT (ventricular tachycardia)     a. h/o ICD ~2007-2008. b. Adm 08/2012 with UTI, had VT/ICD shock x 5 (hypokalemic);  c. 06/2013 s/p ICD Gen change: SJM DC ICD ser #: [id number]  . Protein C deficiency   . NICM (nonischemic cardiomyopathy)     a. s/p AICD  ~2007 (St. Jude) - previous care [place]. No known hx CAD. b. EF 10% by echo 04/2012;  c. 06/2013 s/p ICD Gen change: SJM DC ICD ser #: [id number]  . 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 Cholecystectomy    . Appendectomy    . Pacemaker insertion    . Vena cava filter placement  07/19/2012    Procedure: INSERTION VENA-CAVA FILTER;  Surgeon: Larina Earthly, MD;  Location: [facility] OR;  Service: Vascular;  Laterality: [name];  . Insert / replace / remove pacemaker      ICD  . Abdominal hysterectomy     Family History  Problem Relation Age of Onset  . Hypertension Mother   . Coronary artery disease Neg Hx    History  Substance Use Topics  . Smoking status: Former Games developer  . Smokeless tobacco: Never Used  . Alcohol Use: No   OB History   Grav Para Term Preterm Abortions TAB SAB Ect Mult Living                 Review of Systems  Unable to perform ROS: Patient nonverbal    Allergies  Penicillins; Sulfa antibiotics; Penicillins; and Sulfa antibiotics  Home Medications   Current Outpatient Rx  Name  Route  Sig  Dispense  Refill  . acetaminophen (TYLENOL) 325 MG tablet   Oral   Take 650 mg by  mouth every 6 (six) hours as needed for pain.         Marland Kitchen albuterol (PROVENTIL) (2.5 MG/3ML) 0.083% nebulizer solution   Nebulization   Take 2.5 mg by nebulization every 6 (six) hours as needed. For cough or wheeze         . ALPRAZolam (XANAX) 0.5 MG tablet   Oral   Take 0.5 mg by mouth at bedtime as needed for sleep.         Marland Kitchen amiodarone (PACERONE) 400 MG tablet   Oral   Take 200 mg by mouth See admin instructions. Takes 200mg  on Mon, Tues, Wed, Thurs and Fri's only (none on weekends)         . cyclobenzaprine (FLEXERIL) 5 MG tablet   Oral   Take 1 tablet (5 mg total) by mouth at bedtime as needed for muscle spasms.   20 tablet   0   . digoxin (LANOXIN) 0.125 MG tablet   Oral   Take 1 tablet (0.125 mg total) by mouth  daily.   30 tablet   0   . escitalopram (LEXAPRO) 20 MG tablet   Oral   Take 20 mg by mouth daily.         . furosemide (LASIX) 40 MG tablet   Oral   Take 40 mg by mouth 2 (two) times daily.         . hydrOXYzine (ATARAX/VISTARIL) 50 MG tablet   Oral   Take 50 mg by mouth 3 (three) times daily as needed for anxiety.          . insulin lispro (HUMALOG) 100 UNIT/ML injection   Subcutaneous   Inject into the skin 3 (three) times daily before meals. On sliding scale         . levETIRAcetam (KEPPRA) 1000 MG tablet   Oral   Take 2,000 mg by mouth 2 (two) times daily.         Marland Kitchen levothyroxine (SYNTHROID, LEVOTHROID) 50 MCG tablet   Oral   Take 50 mcg by mouth daily before breakfast.         . lisinopril (PRINIVIL,ZESTRIL) 5 MG tablet   Oral   Take 0.5 tablets (2.5 mg total) by mouth daily.         . metolazone (ZAROXOLYN) 2.5 MG tablet   Oral   Take 2.5 mg by mouth daily as needed. For fluid retention         . metoprolol tartrate (LOPRESSOR) 25 MG tablet   Oral   Take 25 mg by mouth 2 (two) times daily.         Marland Kitchen nystatin cream (MYCOSTATIN)   Topical   Apply 1 application topically 2 (two) times daily.         Marland Kitchen omeprazole (PRILOSEC) 20 MG capsule   Oral   Take 20 mg by mouth 2 (two) times daily.         Marland Kitchen oxyCODONE-acetaminophen (PERCOCET) 10-325 MG per tablet   Oral   Take 1 tablet by mouth every 8 (eight) hours as needed for pain.         Marland Kitchen oxyCODONE-acetaminophen (ROXICET) 5-325 MG per tablet   Oral   Take 1 tablet by mouth every 4 (four) hours as needed for severe pain.   10 tablet   0   . polyethylene glycol (MIRALAX / GLYCOLAX) packet   Oral   Take 17 g by mouth daily as needed. For constipation         .  QUEtiapine Fumarate (SEROQUEL XR) 150 MG 24 hr tablet   Oral   Take 150 mg by mouth at bedtime.         . Rivaroxaban (XARELTO) 20 MG TABS tablet   Oral   Take 20 mg by mouth daily.         Marland Kitchen spironolactone  (ALDACTONE) 25 MG tablet   Oral   Take 0.5 tablets (12.5 mg total) by mouth daily.         Marland Kitchen terazosin (HYTRIN) 2 MG capsule   Oral   Take 2 mg by mouth at bedtime.         . traMADol (ULTRAM) 50 MG tablet   Oral   Take 1 tablet (50 mg total) by mouth every 6 (six) hours as needed for moderate pain (for pain not relief by tylenol).   45 tablet   0   . Vitamin D, Ergocalciferol, (DRISDOL) 50000 UNITS CAPS capsule   Oral   Take 50,000 Units by mouth every 30 (thirty) days.          BP 153/73  Pulse 76  Temp(Src) 98.2 F (36.8 C) (Oral)  Resp 16  SpO2 99% Physical Exam  Nursing note and vitals reviewed. Constitutional: She appears well-developed and well-nourished.  HENT:  Head: Normocephalic and atraumatic.  Eyes: Pupils are equal, round, and reactive to light.  Neck: Normal range of motion.  Cardiovascular: Normal rate and intact distal pulses.   Pulmonary/Chest: Effort normal and breath sounds normal. No respiratory distress. She has no wheezes. She exhibits tenderness (Tenderness palpation of the defibrillator site).  Sat 98% room air  Abdominal: She exhibits no distension. There is no tenderness. There is no rebound and no guarding.  Musculoskeletal: She exhibits no edema.  Neurological: She is alert. She has normal reflexes. No cranial nerve deficit. She exhibits normal muscle tone.  Residual right-sided weakness. Review of previous notes unchanged from baseline (previous CVA). Patient also with expressive aphasia. No new motor or sensory issues noted. Not able to test coordination as patient is uncooperative with exam  Skin: Skin is warm and dry.  Psychiatric: She is slowed. She is noncommunicative.    ED Course  Procedures (including critical care time) Labs Review Labs Reviewed  CBC WITH DIFFERENTIAL - Abnormal; Notable for the following:    RBC 3.43 (*)    Hemoglobin 8.5 (*)    HCT 28.3 (*)    MCH 24.8 (*)    RDW 19.3 (*)    All other components  within normal limits  BASIC METABOLIC PANEL - Abnormal; Notable for the following:    Glucose, Bld 126 (*)    GFR calc non Af Amer 67 (*)    GFR calc Af Amer 78 (*)    All other components within normal limits  PRO B NATRIURETIC PEPTIDE - Abnormal; Notable for the following:    Pro B Natriuretic peptide (BNP) 6710.0 (*)    All other components within normal limits  POCT I-STAT TROPONIN I   Imaging Review Dg Chest 2 View  08/26/2013   CLINICAL DATA:  Left chest pain, shortness of breath  EXAM: CHEST  2 VIEW  COMPARISON:  08/18/2013  FINDINGS: Left pacer wires/AICD are stable. Marked cardiomegaly with central vascular congestion and chronic basilar scarring versus atelectasis. No definite superimposed pneumonia or CHF. No effusion or pneumothorax. Trachea midline. IVC filter noted. Prior cholecystectomy evident.  IMPRESSION: Cardiomegaly with vascular congestion but no definite CHF, pneumonia or pleural effusion. Overall stable exam.  Electronically Signed   By: Ruel Favors M.D.   On: 08/26/2013 16:36    EKG Interpretation   None      Date: 08/26/2013  Rate: 73  Rhythm: normal sinus rhythm  QRS Axis: normal  Intervals: PR prolonged and QT prolonged  ST/T Wave abnormalities: nonspecific ST changes  Conduction Disutrbances:none  Narrative Interpretation:   Old EKG Reviewed: unchanged    MDM   1. Chest wall pain     On arrival afebrile vital signs within normal limits. Patient with extensive cardiac history with defibrillator placement. On exam patient has tenderness palpation of the pacemaker site. She's complaining of only pain there. Similar to previous pain which was diagnosed as chest wall pain. EKG is unremarkable and troponin negative. Not consistent with cardiac related chest pain. Doubt CAD, CHF or other cardiac pathology. Patient also concerns her to fill her mailbox afternoon. The defibrillator was interrogated and discover there has been no discharge the defibrillator.  No abnormal cardiac events noted. CBC and BMP within normal limits. Patient does have a slightly elevated BNP. However satting 98% on room air. Chest x-ray shows no effusions of note. Patient with history of CHF. Do not think is acute exacerbation. Patient exam and history not consistent with aortic pathology. We'll recommend continuing on Lasix and followup with PCP. Strict return precautions were given for worsening pain, shortness of breath or other concerning signs or symptoms. Recommend continuing previously prescribed Norflex and pain medication for chest wall pain. No further acute issues until discharge.   Bridgett Larsson, MD 08/26/13 [phone]

## 2013-08-26 NOTE — ED Notes (Signed)
[facility]AR here to escort Pt. Home.

## 2013-08-26 NOTE — ED Notes (Addendum)
Pt. Reports having chest pain center of the chest for a few days .  Productive cough and cold symptoms.  Pt. Is alert and oriented X3.  Pt. Has a hx of stroke rt. Side weakness.  Pt. Rates her chest pain 10/10, Skin is warm and dry.  Paramedics reported that she refused iv and treatment en route.  Pt. Would not speak to them.  Upon arrival to Ed, when asked where she was , pt. Stated, "I don't know".  When asked who called the ambulance, pt. Stated, "I did." Pt. Is in NAD

## 2013-08-27 NOTE — ED Provider Notes (Addendum)
  This patient was seen in conjunction with the Resident Physician, Dr. Arlie Solomons. The documentation is accurate, and reflective of the encounter.  On my exam, this patient was a very reticent historian, though she was in no distress, and appeared stable.  Patient's evaluation here is largely reassuring, though with some concern from the elevated BNP.  Patient has followup capacity, was discharged in stable condition.  I saw the ECG, and my interpretation is in the EMR   Gerhard Munch, MD 08/27/13 0981  Gerhard Munch, MD 09/03/13 2328

## 2013-08-29 ENCOUNTER — Telehealth (HOSPITAL_COMMUNITY): Payer: Self-pay | Admitting: *Deleted

## 2013-08-29 MED ORDER — METOLAZONE 2.5 MG PO TABS: 2.5000 mg | ORAL_TABLET | ORAL | Status: DC | PRN

## 2013-08-29 NOTE — Telephone Encounter (Signed)
Pt's son called concerned about wt gain, he states pt is up about 6 lb in past couple of days, she has been taking lasix but does not have any metolazone, rx sent in he will give it to her today and tomorrow and call us back if wt not coming down

## 2013-08-31 ENCOUNTER — Emergency Department (HOSPITAL_COMMUNITY): Payer: Medicare Other

## 2013-08-31 ENCOUNTER — Observation Stay (HOSPITAL_COMMUNITY)
Admission: EM | Admit: 2013-08-31 | Discharge: 2013-09-01 | Disposition: A | Discharge status: Home / Self Care | Payer: Medicare Other | Attending: Internal Medicine | Admitting: Internal Medicine

## 2013-08-31 ENCOUNTER — Encounter (HOSPITAL_COMMUNITY): Payer: Self-pay | Admitting: Emergency Medicine

## 2013-08-31 DIAGNOSIS — Z794 Long term (current) use of insulin: Secondary | ICD-10-CM | POA: Insufficient documentation

## 2013-08-31 DIAGNOSIS — I69320 Aphasia following cerebral infarction: Secondary | ICD-10-CM

## 2013-08-31 DIAGNOSIS — D6859 Other primary thrombophilia: Secondary | ICD-10-CM

## 2013-08-31 DIAGNOSIS — Z86718 Personal history of other venous thrombosis and embolism: Secondary | ICD-10-CM | POA: Insufficient documentation

## 2013-08-31 DIAGNOSIS — G8929 Other chronic pain: Secondary | ICD-10-CM

## 2013-08-31 DIAGNOSIS — E039 Hypothyroidism, unspecified: Secondary | ICD-10-CM

## 2013-08-31 DIAGNOSIS — I509 Heart failure, unspecified: Secondary | ICD-10-CM | POA: Insufficient documentation

## 2013-08-31 DIAGNOSIS — J9600 Acute respiratory failure, unspecified whether with hypoxia or hypercapnia: Secondary | ICD-10-CM

## 2013-08-31 DIAGNOSIS — Z7901 Long term (current) use of anticoagulants: Secondary | ICD-10-CM | POA: Insufficient documentation

## 2013-08-31 DIAGNOSIS — I472 Ventricular tachycardia: Secondary | ICD-10-CM

## 2013-08-31 DIAGNOSIS — J45909 Unspecified asthma, uncomplicated: Secondary | ICD-10-CM

## 2013-08-31 DIAGNOSIS — N289 Disorder of kidney and ureter, unspecified: Secondary | ICD-10-CM

## 2013-08-31 DIAGNOSIS — I428 Other cardiomyopathies: Secondary | ICD-10-CM

## 2013-08-31 DIAGNOSIS — E876 Hypokalemia: Secondary | ICD-10-CM | POA: Insufficient documentation

## 2013-08-31 DIAGNOSIS — Z87891 Personal history of nicotine dependence: Secondary | ICD-10-CM | POA: Insufficient documentation

## 2013-08-31 DIAGNOSIS — I6930 Unspecified sequelae of cerebral infarction: Secondary | ICD-10-CM

## 2013-08-31 DIAGNOSIS — F1110 Opioid abuse, uncomplicated: Secondary | ICD-10-CM

## 2013-08-31 DIAGNOSIS — I5022 Chronic systolic (congestive) heart failure: Secondary | ICD-10-CM | POA: Diagnosis present

## 2013-08-31 DIAGNOSIS — D696 Thrombocytopenia, unspecified: Secondary | ICD-10-CM

## 2013-08-31 DIAGNOSIS — R51 Headache: Secondary | ICD-10-CM | POA: Insufficient documentation

## 2013-08-31 DIAGNOSIS — I10 Essential (primary) hypertension: Secondary | ICD-10-CM | POA: Insufficient documentation

## 2013-08-31 DIAGNOSIS — Z9119 Patient's noncompliance with other medical treatment and regimen: Secondary | ICD-10-CM

## 2013-08-31 DIAGNOSIS — E119 Type 2 diabetes mellitus without complications: Secondary | ICD-10-CM | POA: Insufficient documentation

## 2013-08-31 DIAGNOSIS — G40909 Epilepsy, unspecified, not intractable, without status epilepticus: Secondary | ICD-10-CM

## 2013-08-31 DIAGNOSIS — R079 Chest pain, unspecified: Principal | ICD-10-CM | POA: Insufficient documentation

## 2013-08-31 DIAGNOSIS — I4891 Unspecified atrial fibrillation: Secondary | ICD-10-CM | POA: Insufficient documentation

## 2013-08-31 DIAGNOSIS — Z95810 Presence of automatic (implantable) cardiac defibrillator: Secondary | ICD-10-CM | POA: Insufficient documentation

## 2013-08-31 DIAGNOSIS — I5041 Acute combined systolic (congestive) and diastolic (congestive) heart failure: Secondary | ICD-10-CM

## 2013-08-31 LAB — GLUCOSE, CAPILLARY: Glucose-Capillary: 107 mg/dL — ABNORMAL HIGH (ref 70–99)

## 2013-08-31 LAB — COMPREHENSIVE METABOLIC PANEL
ALT: 22 U/L (ref 0–35)
Alkaline Phosphatase: 79 U/L (ref 39–117)
CO2: 30 mEq/L (ref 19–32)
Calcium: 9.3 mg/dL (ref 8.4–10.5)
Chloride: 93 mEq/L — ABNORMAL LOW (ref 96–112)
GFR calc Af Amer: 76 mL/min — ABNORMAL LOW (ref >90)
GFR calc non Af Amer: 65 mL/min — ABNORMAL LOW (ref >90)
Glucose, Bld: 81 mg/dL (ref 70–99)
Potassium: 2.7 mEq/L — CL (ref 3.5–5.1)
Sodium: 136 mEq/L (ref 135–145)
Total Bilirubin: 1 mg/dL (ref 0.3–1.2)

## 2013-08-31 LAB — CBC WITH DIFFERENTIAL/PLATELET
Basophils Relative: 0 % (ref 0–1)
Eosinophils Relative: 3 % (ref 0–5)
HCT: 32 % — ABNORMAL LOW (ref 36.0–46.0)
Lymphocytes Relative: 19 % (ref 12–46)
Lymphs Abs: 1.1 10*3/uL (ref 0.7–4.0)
MCV: 80 fL (ref 78.0–100.0)
Monocytes Absolute: 0.9 10*3/uL (ref 0.1–1.0)
Neutro Abs: 3.8 10*3/uL (ref 1.7–7.7)
Neutrophils Relative %: 64 % (ref 43–77)
Platelets: 252 10*3/uL (ref 150–400)
RBC: 4 MIL/uL (ref 3.87–5.11)
WBC: 6 10*3/uL (ref 4.0–10.5)

## 2013-08-31 LAB — TROPONIN I: Troponin I: <0.3 ng/mL (ref <0.30)

## 2013-08-31 MED ORDER — MORPHINE SULFATE 2 MG/ML IJ SOLN
2.0000 mg | INTRAMUSCULAR | Status: DC | PRN
  Administered 2013-08-31 – 2013-09-01 (×3): 2 mg via INTRAVENOUS
  Filled 2013-08-31 (×3): qty 1

## 2013-08-31 MED ORDER — QUETIAPINE FUMARATE ER 50 MG PO TB24
150.0000 mg | ORAL_TABLET | Freq: Every day | ORAL | Status: DC
  Filled 2013-08-31 (×2): qty 3

## 2013-08-31 MED ORDER — ASPIRIN 325 MG PO TABS
325.0000 mg | ORAL_TABLET | Freq: Once | ORAL | Status: AC
  Administered 2013-08-31: 325 mg via ORAL
  Filled 2013-08-31: qty 1

## 2013-08-31 MED ORDER — RIVAROXABAN 20 MG PO TABS
20.0000 mg | ORAL_TABLET | Freq: Every day | ORAL | Status: DC
Start: 2013-08-31 — End: 2013-09-01
  Filled 2013-08-31 (×2): qty 1

## 2013-08-31 MED ORDER — METOPROLOL TARTRATE 25 MG PO TABS
25.0000 mg | ORAL_TABLET | Freq: Two times a day (BID) | ORAL | Status: DC
  Administered 2013-09-01: 25 mg via ORAL
  Filled 2013-08-31 (×3): qty 1

## 2013-08-31 MED ORDER — POTASSIUM CHLORIDE CRYS ER 20 MEQ PO TBCR
60.0000 meq | EXTENDED_RELEASE_TABLET | Freq: Four times a day (QID) | ORAL | Status: AC
  Filled 2013-08-31: qty 3

## 2013-08-31 MED ORDER — SODIUM CHLORIDE 0.9 % IJ SOLN
3.0000 mL | Freq: Two times a day (BID) | INTRAMUSCULAR | Status: DC
  Administered 2013-08-31 – 2013-09-01 (×2): 3 mL via INTRAVENOUS

## 2013-08-31 MED ORDER — HYDROXYZINE HCL 50 MG PO TABS
50.0000 mg | ORAL_TABLET | Freq: Three times a day (TID) | ORAL | Status: DC | PRN
  Administered 2013-09-01: 50 mg via ORAL
  Filled 2013-08-31 (×2): qty 1

## 2013-08-31 MED ORDER — SPIRONOLACTONE 50 MG PO TABS
50.0000 mg | ORAL_TABLET | Freq: Every day | ORAL | Status: DC
  Administered 2013-09-01: 11:00:00 50 mg via ORAL
  Filled 2013-08-31 (×2): qty 1

## 2013-08-31 MED ORDER — PANTOPRAZOLE SODIUM 40 MG PO TBEC
40.0000 mg | DELAYED_RELEASE_TABLET | Freq: Every day | ORAL | Status: DC
  Administered 2013-09-01: 40 mg via ORAL

## 2013-08-31 MED ORDER — POLYETHYLENE GLYCOL 3350 17 G PO PACK
17.0000 g | PACK | Freq: Every day | ORAL | Status: DC | PRN
  Filled 2013-08-31: qty 1

## 2013-08-31 MED ORDER — ALUM & MAG HYDROXIDE-SIMETH 200-200-20 MG/5ML PO SUSP: 30.0000 mL | Freq: Four times a day (QID) | ORAL | Status: DC | PRN

## 2013-08-31 MED ORDER — POTASSIUM CHLORIDE 10 MEQ/100ML IV SOLN
10.0000 meq | INTRAVENOUS | Status: DC
  Administered 2013-08-31 (×2): 10 meq via INTRAVENOUS
  Filled 2013-08-31 (×3): qty 100

## 2013-08-31 MED ORDER — ACETAMINOPHEN 325 MG PO TABS: 650.0000 mg | ORAL_TABLET | Freq: Four times a day (QID) | ORAL | Status: DC | PRN

## 2013-08-31 MED ORDER — LISINOPRIL 2.5 MG PO TABS
2.5000 mg | ORAL_TABLET | Freq: Every day | ORAL | Status: DC
  Administered 2013-09-01: 11:00:00 2.5 mg via ORAL
  Filled 2013-08-31 (×2): qty 1

## 2013-08-31 MED ORDER — VITAMIN D (ERGOCALCIFEROL) 1.25 MG (50000 UNIT) PO CAPS: 50000.0000 [IU] | ORAL_CAPSULE | ORAL | Status: DC

## 2013-08-31 MED ORDER — ONDANSETRON HCL 4 MG/2ML IJ SOLN: 4.0000 mg | Freq: Four times a day (QID) | INTRAMUSCULAR | Status: DC | PRN

## 2013-08-31 MED ORDER — LEVETIRACETAM 500 MG PO TABS
2000.0000 mg | ORAL_TABLET | Freq: Two times a day (BID) | ORAL | Status: DC
Start: 2013-08-31 — End: 2013-09-01
  Administered 2013-09-01: 11:00:00 2000 mg via ORAL
  Filled 2013-08-31 (×3): qty 1

## 2013-08-31 MED ORDER — AMIODARONE HCL 200 MG PO TABS
200.0000 mg | ORAL_TABLET | ORAL | Status: DC
  Administered 2013-09-01: 11:00:00 200 mg via ORAL
  Filled 2013-08-31: qty 1

## 2013-08-31 MED ORDER — ALPRAZOLAM 0.5 MG PO TABS
0.5000 mg | ORAL_TABLET | Freq: Every evening | ORAL | Status: DC | PRN
  Administered 2013-09-01: 02:00:00 0.5 mg via ORAL
  Filled 2013-08-31 (×2): qty 1

## 2013-08-31 MED ORDER — DIGOXIN 125 MCG PO TABS
0.1250 mg | ORAL_TABLET | Freq: Every day | ORAL | Status: DC
  Administered 2013-09-01: 11:00:00 0.125 mg via ORAL
  Filled 2013-08-31 (×2): qty 1

## 2013-08-31 MED ORDER — TERAZOSIN HCL 2 MG PO CAPS
2.0000 mg | ORAL_CAPSULE | Freq: Every day | ORAL | Status: DC
  Filled 2013-08-31 (×2): qty 1

## 2013-08-31 MED ORDER — ACETAMINOPHEN 650 MG RE SUPP: 650.0000 mg | Freq: Four times a day (QID) | RECTAL | Status: DC | PRN

## 2013-08-31 MED ORDER — INSULIN ASPART 100 UNIT/ML ~~LOC~~ SOLN
0.0000 [IU] | Freq: Three times a day (TID) | SUBCUTANEOUS | Status: DC
  Administered 2013-09-01: 11:00:00 2 [IU] via SUBCUTANEOUS

## 2013-08-31 MED ORDER — OXYCODONE-ACETAMINOPHEN 5-325 MG PO TABS
1.0000 | ORAL_TABLET | ORAL | Status: DC | PRN
  Filled 2013-08-31: qty 1

## 2013-08-31 MED ORDER — LEVOTHYROXINE SODIUM 50 MCG PO TABS
50.0000 ug | ORAL_TABLET | Freq: Every day | ORAL | Status: DC
  Administered 2013-09-01: 06:00:00 50 ug via ORAL
  Filled 2013-08-31 (×2): qty 1

## 2013-08-31 MED ORDER — FUROSEMIDE 40 MG PO TABS
40.0000 mg | ORAL_TABLET | Freq: Every day | ORAL | Status: DC
  Administered 2013-09-01: 40 mg via ORAL
  Filled 2013-08-31 (×2): qty 1

## 2013-08-31 MED ORDER — ESCITALOPRAM OXALATE 20 MG PO TABS
20.0000 mg | ORAL_TABLET | Freq: Every day | ORAL | Status: DC
  Administered 2013-09-01: 11:00:00 20 mg via ORAL
  Filled 2013-08-31 (×2): qty 1

## 2013-08-31 MED ORDER — ONDANSETRON HCL 4 MG PO TABS: 4.0000 mg | ORAL_TABLET | Freq: Four times a day (QID) | ORAL | Status: DC | PRN

## 2013-08-31 MED ORDER — MAGNESIUM SULFATE 40 MG/ML IJ SOLN
2.0000 g | Freq: Once | INTRAMUSCULAR | Status: AC
  Administered 2013-09-01: 01:00:00 2 g via INTRAVENOUS
  Filled 2013-08-31: qty 50

## 2013-08-31 NOTE — ED Provider Notes (Signed)
CSN: [id number]     Arrival date & time 08/31/13  1556 History   First MD Initiated Contact with Patient 08/31/13 1602     Chief Complaint  Patient presents with  . Chest Pain   (Consider location/radiation/quality/duration/timing/severity/associated sxs/prior Treatment) HPI Elizabeth Love is a 57 y.o. female who presents to the emergency department for concern of chest pain.  History very difficult to obtain as she has a history of stroke with expressive aphasia.  But she reports that she has a two day history of chest pain.  Centrally located.  Worse with movement.  Also feeling palpitations and possibly that her defibrillator went off.  Associated with some SOB.  Described as moderate.  Similar to previous.  No alleviating/aggravating factors.  No other symptoms.  Past Medical History  Diagnosis Date  . Seizures   . Hypertension   . Stroke     2007 - R sided weakness and expressive aphasia with h/o behavioral problems related to this  . Asthma   . Diabetes mellitus   . Chronic systolic CHF (congestive heart failure)   . Atrial fibrillation     a. On Xarelto after having DVT (prev felt to be poor anticoag cand 2/2 noncompliance).  . History of DVT (deep vein thrombosis)     a. 06/2012: RLE DVT, + protein C deficiency, placed on Xarelto. s/p IVC filter  . Noncompliance     Due to mental status, family has had to coax her to take medicines  . Hypothyroidism   . VT (ventricular tachycardia)     a. h/o ICD ~2007-2008. b. Adm 08/2012 with UTI, had VT/ICD shock x 5 (hypokalemic);  c. 06/2013 s/p ICD Gen change: SJM DC ICD ser #: [id number]  . Protein C deficiency   . NICM (nonischemic cardiomyopathy)     a. s/p AICD ~2007 (St. Jude) - previous care [place]. No known hx CAD. b. EF 10% by echo 04/2012;  c. 06/2013 s/p ICD Gen change: SJM DC ICD ser #: [id number]  . 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 Cholecystectomy    .  Appendectomy    . Pacemaker insertion    . Vena cava filter placement  07/19/2012    Procedure: INSERTION VENA-CAVA FILTER;  Surgeon: Larina Earthly, MD;  Location: [facility] OR;  Service: Vascular;  Laterality: [name];  . Insert / replace / remove pacemaker      ICD  . Abdominal hysterectomy     Family History  Problem Relation Age of Onset  . Hypertension Mother   . Coronary artery disease Neg Hx    History  Substance Use Topics  . Smoking status: Former Games developer  . Smokeless tobacco: Never Used  . Alcohol Use: No   OB History   Grav Para Term Preterm Abortions TAB SAB Ect Mult Living                 Review of Systems  Constitutional: Negative for chills.  HENT: Negative for congestion and rhinorrhea.   Gastrointestinal: Negative for diarrhea and abdominal distention.  Endocrine: Negative for polyuria.  Genitourinary: Negative for dysuria.  Musculoskeletal: Negative for neck pain and neck stiffness.  Skin: Negative for rash.  Psychiatric/Behavioral: Negative.     Allergies  Penicillins; Sulfa antibiotics; Penicillins; and Sulfa antibiotics  Home Medications   Current Outpatient Rx  Name  Route  Sig  Dispense  Refill  . acetaminophen (TYLENOL)  325 MG tablet   Oral   Take 650 mg by mouth every 6 (six) hours as needed for pain.         Marland Kitchen albuterol (PROVENTIL) (2.5 MG/3ML) 0.083% nebulizer solution   Nebulization   Take 2.5 mg by nebulization every 6 (six) hours as needed. For cough or wheeze         . ALPRAZolam (XANAX) 0.5 MG tablet   Oral   Take 0.5 mg by mouth at bedtime as needed for sleep.         Marland Kitchen amiodarone (PACERONE) 400 MG tablet   Oral   Take 200 mg by mouth See admin instructions. Takes 200mg  on Mon, Tues, Wed, Thurs and Fri's only (none on weekends)         . cyclobenzaprine (FLEXERIL) 5 MG tablet   Oral   Take 1 tablet (5 mg total) by mouth at bedtime as needed for muscle spasms.   20 tablet   0   . digoxin (LANOXIN) 0.125 MG tablet   Oral    Take 1 tablet (0.125 mg total) by mouth daily.   30 tablet   0   . escitalopram (LEXAPRO) 20 MG tablet   Oral   Take 20 mg by mouth daily.         . furosemide (LASIX) 40 MG tablet   Oral   Take 40 mg by mouth 2 (two) times daily.         . hydrOXYzine (ATARAX/VISTARIL) 50 MG tablet   Oral   Take 50 mg by mouth 3 (three) times daily as needed for anxiety.          . insulin lispro (HUMALOG) 100 UNIT/ML injection   Subcutaneous   Inject into the skin 3 (three) times daily before meals. On sliding scale         . levETIRAcetam (KEPPRA) 1000 MG tablet   Oral   Take 2,000 mg by mouth 2 (two) times daily.         Marland Kitchen levothyroxine (SYNTHROID, LEVOTHROID) 50 MCG tablet   Oral   Take 50 mcg by mouth daily before breakfast.         . lisinopril (PRINIVIL,ZESTRIL) 5 MG tablet   Oral   Take 0.5 tablets (2.5 mg total) by mouth daily.         . metolazone (ZAROXOLYN) 2.5 MG tablet   Oral   Take 1 tablet (2.5 mg total) by mouth as needed. For fluid retention   5 tablet   1   . metoprolol tartrate (LOPRESSOR) 25 MG tablet   Oral   Take 25 mg by mouth 2 (two) times daily.         Marland Kitchen nystatin cream (MYCOSTATIN)   Topical   Apply 1 application topically 2 (two) times daily.         Marland Kitchen omeprazole (PRILOSEC) 20 MG capsule   Oral   Take 20 mg by mouth 2 (two) times daily.         Marland Kitchen oxyCODONE-acetaminophen (PERCOCET) 10-325 MG per tablet   Oral   Take 1 tablet by mouth every 8 (eight) hours as needed for pain.         Marland Kitchen oxyCODONE-acetaminophen (ROXICET) 5-325 MG per tablet   Oral   Take 1 tablet by mouth every 4 (four) hours as needed for severe pain.   10 tablet   0   . polyethylene glycol (MIRALAX / GLYCOLAX) packet   Oral   Take 17  g by mouth daily as needed. For constipation         . QUEtiapine Fumarate (SEROQUEL XR) 150 MG 24 hr tablet   Oral   Take 150 mg by mouth at bedtime.         . Rivaroxaban (XARELTO) 20 MG TABS tablet   Oral   Take  20 mg by mouth daily.         Marland Kitchen spironolactone (ALDACTONE) 25 MG tablet   Oral   Take 0.5 tablets (12.5 mg total) by mouth daily.         Marland Kitchen terazosin (HYTRIN) 2 MG capsule   Oral   Take 2 mg by mouth at bedtime.         . traMADol (ULTRAM) 50 MG tablet   Oral   Take 1 tablet (50 mg total) by mouth every 6 (six) hours as needed for moderate pain (for pain not relief by tylenol).   45 tablet   0   . Vitamin D, Ergocalciferol, (DRISDOL) 50000 UNITS CAPS capsule   Oral   Take 50,000 Units by mouth every 30 (thirty) days.          BP 103/44  Pulse 66  Temp(Src) 98.2 F (36.8 C) (Oral)  Resp 25  Ht 5' (1.524 m)  Wt 113 lb (51.256 kg)  BMI 22.07 kg/m2  SpO2 100% Physical Exam  Nursing note and vitals reviewed. Constitutional: She appears well-developed and well-nourished. No distress.  HENT:  Head: Normocephalic and atraumatic.  Right Ear: External ear normal.  Left Ear: External ear normal.  Nose: Nose normal.  Mouth/Throat: Oropharynx is clear and moist. No oropharyngeal exudate.  Eyes: EOM are normal. Pupils are equal, round, and reactive to light.  Neck: Normal range of motion. Neck supple. No tracheal deviation present.  Cardiovascular: Normal rate.   Pulmonary/Chest: Effort normal and breath sounds normal. No stridor. No respiratory distress. She has no wheezes. She has no rales.  Abdominal: Soft. She exhibits no distension. There is no tenderness. There is no rebound.  Musculoskeletal: Normal range of motion.  Neurological: She is alert.  Expressive aphasia  Skin: Skin is warm and dry. She is not diaphoretic.    ED Course  Procedures (including critical care time) Labs Review Labs Reviewed  CBC WITH DIFFERENTIAL - Abnormal; Notable for the following:    Hemoglobin 9.9 (*)    HCT 32.0 (*)    MCH 24.8 (*)    RDW 18.3 (*)    Monocytes Relative 15 (*)    All other components within normal limits  COMPREHENSIVE METABOLIC PANEL - Abnormal; Notable for  the following:    Potassium 2.7 (*)    Chloride 93 (*)    Albumin 3.3 (*)    AST 43 (*)    GFR calc non Af Amer 65 (*)    GFR calc Af Amer 76 (*)    All other components within normal limits  TROPONIN I  D-DIMER, QUANTITATIVE   Imaging Review Dg Chest 1 View  08/31/2013   CLINICAL DATA:  Upper left side chest pain, headaches, history stroke, hypertension, diabetes, asthma, CHF, non ischemic cardiomyopathy, protein C deficiency  EXAM: CHEST - 1 VIEW  COMPARISON:  08/26/2013  FINDINGS: Left subclavian AICD leads project over right atrium and right ventricle.  Significant enlargement of cardiac silhouette with pulmonary vascular congestion.  Persistent mild perihilar interstitial prominence compatible with minimal chronic failure.  No segmental consolidation, pleural effusion or pneumothorax.  Bones demineralized.  IMPRESSION: Chronic  CHF.   Electronically Signed   By: Ulyses Southward M.D.   On: 08/31/2013 18:22   Ct Head Wo Contrast  08/31/2013   CLINICAL DATA:  Chest pain. Head pain. History of left-sided stroke.  EXAM: CT HEAD WITHOUT CONTRAST  TECHNIQUE: Contiguous axial images were obtained from the base of the skull through the vertex without intravenous contrast.  COMPARISON:  05/31/2013.  FINDINGS: No mass lesion, mass effect, midline shift, hydrocephalus, hemorrhage. No territorial ischemia or acute infarction. Chronic encephalomalacia in the left MCA distribution. Mastoid air cells clear. Paranasal sinuses clear.  IMPRESSION: Old large left MCA infarct.  No acute abnormality.   Electronically Signed   By: Andreas Newport M.D.   On: 08/31/2013 18:15    EKG Interpretation    Date/Time:  Sunday August 31 2013 16:01:39 EST Ventricular Rate:  82 PR Interval:  208 QRS Duration: 134 QT Interval:  516 QTC Calculation: 603 R Axis:   49 Text Interpretation:  Atrial-paced complexes Paired ventricular premature complexes Borderline prolonged PR interval Left atrial enlargement Left bundle  branch block Multiple PVCs No significant changes since last tracing Confirmed by Gwendolyn Grant  MD, BLAIR ([id number]) on 08/31/2013 5:25:15 PM            MDM   1. Chest pain   2. Automatic implantable cardiac defibrillator in situ   3. Chronic pain   4. Chronic systolic CHF (congestive heart failure)   5. DM type 2 (diabetes mellitus, type 2)   6. History of DVT (deep vein thrombosis)   7. Hypokalemia     Elizabeth Love is a 57 y.o. female with history of DVT on anticoagulation, CAD, CHF with EF of 15-20%, and stroke with residual aphashia who presents to the emergency department for concern of chest pain.  EKG done adn with numerous PVCs.  ICD interrogated and without defibrillation.  Labs done and with significant hypokalemia.  PE ruled out with D-dimer.  Patient at high risk for coronary event.  Hospitalists consulted for admission.  Patient safe for admission to telemetry.  Patient admitted.    Arloa Koh, MD 08/31/13 [phone]

## 2013-08-31 NOTE — ED Provider Notes (Signed)
I saw and evaluated the patient, reviewed the resident's note and I agree with the findings and plan.  EKG Interpretation    Date/Time:  Sunday August 31 2013 16:01:39 EST Ventricular Rate:  82 PR Interval:  208 QRS Duration: 134 QT Interval:  516 QTC Calculation: 603 R Axis:   49 Text Interpretation:  Atrial-paced complexes Paired ventricular premature complexes Borderline prolonged PR interval Left atrial enlargement Left bundle branch block Multiple PVCs No significant changes since last tracing Confirmed by Gwendolyn Grant  MD, Monique Gift ([id number]) on 08/31/2013 5:25:15 PM            Patient here with chest pain. Difficult exam secondary to expressive aphasia. Seen here for CP 4 days ago, sent home. Patient stated her defibrillator was going off - interrogated here, no major problems. Patient's EKG as above, no significant changes except for multiple PVCs. Patient had labs showing hypokalemia. Admitted to medicine.  Dagmar Hait, MD 08/31/13 [phone]

## 2013-08-31 NOTE — ED Notes (Addendum)
Patient refused to let EMS start IV and this RN. Patient requested IV team. IV team called

## 2013-08-31 NOTE — ED Notes (Signed)
Interrogated and transmitted PM via st jude device

## 2013-08-31 NOTE — ED Notes (Signed)
Patient presents to ED via EMS from home with complaints of chest pain, shortness of breath

## 2013-08-31 NOTE — ED Notes (Signed)
MD at bedside. 

## 2013-08-31 NOTE — H&P (Signed)
[facility] History and Physical  Elizabeth Love WUJ:[id number] DOB: 1956-03-10 DOA: 08/31/2013  Referring physician:  PCP: [name], [name], MD   Chief Complaint: Chest pain  HPI: Elizabeth Love is a 57 y.o. female with complex past medical history including chronic CHF with LVEF of 20% status post pacemaker/AICD, seizure, history of CVA, DVT and noncompliance. Patient came in to the hospital because of chest pain. Patient is very poor historian and she was crying when I was interviewing her, she was doing fine until yesterday when she started to have generalized pain, muscle cramps and started to have headache today so she came in to the hospital for further evaluation. She said she also has some chest pain and pointing to her defibrillator. In the ED potassium was found to be 2.7, negative D. dimers of 0.27, chest x-ray is negative, 12 EKG showed paced rhythm was multiple PVCs, has negative first set of troponin. Patient admitted to the hospital for further evaluation.  Review of Systems:  Very difficult to get the review of system out of her because of crying, slurred speech. Constitutional: Generalized weakness and "pain all over" Eyes: negative for irritation, redness and visual disturbance Ears, nose, mouth, throat, and face: negative for earaches, epistaxis, nasal congestion and sore throat Respiratory: negative for cough, dyspnea on exertion, sputum and wheezing Cardiovascular: negative for chest pain, dyspnea, lower extremity edema, orthopnea, palpitations and syncope Gastrointestinal: negative for abdominal pain, constipation, diarrhea, melena, nausea and vomiting Genitourinary:negative for dysuria, frequency and hematuria Hematologic/lymphatic: negative for bleeding, easy bruising and lymphadenopathy Musculoskeletal: Myalgia and spasms Neurological: Headache Endocrine: negative for diabetic symptoms including polydipsia, polyuria and weight  loss Allergic/Immunologic: negative for anaphylaxis, hay fever and urticaria .   Past Medical History  Diagnosis Date  . Seizures   . Hypertension   . Stroke     2007 - R sided weakness and expressive aphasia with h/o behavioral problems related to this  . Asthma   . Diabetes mellitus   . Chronic systolic CHF (congestive heart failure)   . Atrial fibrillation     a. On Xarelto after having DVT (prev felt to be poor anticoag cand 2/2 noncompliance).  . History of DVT (deep vein thrombosis)     a. 06/2012: RLE DVT, + protein C deficiency, placed on Xarelto. s/p IVC filter  . Noncompliance     Due to mental status, family has had to coax her to take medicines  . Hypothyroidism   . VT (ventricular tachycardia)     a. h/o ICD ~2007-2008. b. Adm 08/2012 with UTI, had VT/ICD shock x 5 (hypokalemic);  c. 06/2013 s/p ICD Gen change: SJM DC ICD ser #: [id number]  . Protein C deficiency   . NICM (nonischemic cardiomyopathy)     a. s/p AICD ~2007 ([employer]) - previous care [place]. No known hx CAD. b. EF 10% by echo 04/2012;  c. 06/2013 s/p ICD Gen change: SJM DC ICD ser #: [id number]  . 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 Cholecystectomy    . Appendectomy    . Pacemaker insertion    . Vena cava filter placement  07/19/2012    Procedure: INSERTION VENA-CAVA FILTER;  Surgeon: Larina Earthly, MD;  Location: [facility] OR;  Service: Vascular;  Laterality: [name];  . Insert / replace / remove pacemaker      ICD  . Abdominal hysterectomy  Social History:  reports that she has quit smoking. She has never used smokeless tobacco. She reports that she does not drink alcohol or use illicit drugs.  Allergies  Allergen Reactions  . Penicillins   . Sulfa Antibiotics   . Penicillins Rash  . Sulfa Antibiotics Rash    Family History  Problem Relation Age of Onset  . Hypertension Mother   . Coronary artery disease Neg Hx      Prior to Admission  medications   Medication Sig Start Date End Date Taking? Authorizing Provider  acetaminophen (TYLENOL) 325 MG tablet Take 650 mg by mouth every 6 (six) hours as needed for pain.    Historical Provider, MD  albuterol (PROVENTIL) (2.5 MG/3ML) 0.083% nebulizer solution Take 2.5 mg by nebulization every 6 (six) hours as needed. For cough or wheeze    Historical Provider, MD  ALPRAZolam Prudy Feeler) 0.5 MG tablet Take 0.5 mg by mouth at bedtime as needed for sleep.    Historical Provider, MD  amiodarone (PACERONE) 400 MG tablet Take 200 mg by mouth See admin instructions. Takes 200mg  on Mon, Tues, Wed, Thurs and Fri's only (none on weekends)    Historical Provider, MD  cyclobenzaprine (FLEXERIL) 5 MG tablet Take 1 tablet (5 mg total) by mouth at bedtime as needed for muscle spasms. 08/06/13   Vassie Loll, MD  digoxin (LANOXIN) 0.125 MG tablet Take 1 tablet (0.125 mg total) by mouth daily. 01/10/13   Sorin Luanne Bras, MD  escitalopram (LEXAPRO) 20 MG tablet Take 20 mg by mouth daily.    Historical Provider, MD  furosemide (LASIX) 40 MG tablet Take 40 mg by mouth 2 (two) times daily.    Historical Provider, MD  hydrOXYzine (ATARAX/VISTARIL) 50 MG tablet Take 50 mg by mouth 3 (three) times daily as needed for anxiety.     Historical Provider, MD  insulin lispro (HUMALOG) 100 UNIT/ML injection Inject into the skin 3 (three) times daily before meals. On sliding scale    Historical Provider, MD  levETIRAcetam (KEPPRA) 1000 MG tablet Take 2,000 mg by mouth 2 (two) times daily.    Historical Provider, MD  levothyroxine (SYNTHROID, LEVOTHROID) 50 MCG tablet Take 50 mcg by mouth daily before breakfast.    Historical Provider, MD  lisinopril (PRINIVIL,ZESTRIL) 5 MG tablet Take 0.5 tablets (2.5 mg total) by mouth daily. 08/06/13   Vassie Loll, MD  metolazone (ZAROXOLYN) 2.5 MG tablet Take 1 tablet (2.5 mg total) by mouth as needed. For fluid retention 08/29/13   Dolores Patty, MD  metoprolol tartrate (LOPRESSOR) 25 MG  tablet Take 25 mg by mouth 2 (two) times daily.    Historical Provider, MD  nystatin cream (MYCOSTATIN) Apply 1 application topically 2 (two) times daily.    Historical Provider, MD  omeprazole (PRILOSEC) 20 MG capsule Take 20 mg by mouth 2 (two) times daily.    Historical Provider, MD  oxyCODONE-acetaminophen (PERCOCET) 10-325 MG per tablet Take 1 tablet by mouth every 8 (eight) hours as needed for pain.    Historical Provider, MD  oxyCODONE-acetaminophen (ROXICET) 5-325 MG per tablet Take 1 tablet by mouth every 4 (four) hours as needed for severe pain. 08/18/13   Rocco Serene, MD  polyethylene glycol [facility] Ethelene Hal) packet Take 17 g by mouth daily as needed. For constipation    Historical Provider, MD  QUEtiapine Fumarate (SEROQUEL XR) 150 MG 24 hr tablet Take 150 mg by mouth at bedtime.    Historical Provider, MD  Rivaroxaban (XARELTO) 20 MG TABS  tablet Take 20 mg by mouth daily.    Historical Provider, MD  spironolactone (ALDACTONE) 25 MG tablet Take 0.5 tablets (12.5 mg total) by mouth daily. 08/06/13   Vassie Loll, MD  terazosin (HYTRIN) 2 MG capsule Take 2 mg by mouth at bedtime.    Historical Provider, MD  traMADol (ULTRAM) 50 MG tablet Take 1 tablet (50 mg total) by mouth every 6 (six) hours as needed for moderate pain (for pain not relief by tylenol). 08/06/13   Vassie Loll, MD  Vitamin D, Ergocalciferol, (DRISDOL) 50000 UNITS CAPS capsule Take 50,000 Units by mouth every 30 (thirty) days.    Historical Provider, MD   Physical Exam:  BP 103/44  Pulse 66  Temp(Src) 98.2 F (36.8 C) (Oral)  Resp 25  Ht 5' (1.524 m)  Wt 51.256 kg (113 lb)  BMI 22.07 kg/m2  SpO2 100% General appearance: Emotional, she is crying, slurred speech.  Head: Normocephalic, without obvious abnormality, atraumatic  Eyes: conjunctivae/corneas clear. PERRL, EOM's intact. Fundi benign.  Nose: Nares normal. Septum midline. Mucosa normal. No drainage or sinus tenderness.  Throat: lips, mucosa, and  tongue normal; teeth and gums normal  Neck: Supple, no masses, no cervical lymphadenopathy, no JVD appreciated, no meningeal signs Resp: clear to auscultation bilaterally  Chest wall: no tenderness  Cardio: regular rate and rhythm, S1, S2 normal, no murmur, click, rub or gallop  GI: soft, non-tender; bowel sounds normal; no masses, no organomegaly  Extremities: extremities normal, atraumatic, no cyanosis or edema  Skin: Skin color, texture, turgor normal. No rashes or lesions  Neurologic: Alert and oriented X 3, right dense hemiplegia, slurred speech   Labs on Admission:  Basic Metabolic Panel:  Recent Labs Lab 08/26/13 1643 08/31/13 1649  NA 138 136  K 3.6 2.7*  CL 102 93*  CO2 25 30  GLUCOSE 126* 81  BUN 17 17  CREATININE 0.94 0.96  CALCIUM 9.1 9.3   Liver Function Tests:  Recent Labs Lab 08/31/13 1649  AST 43*  ALT 22  ALKPHOS 79  BILITOT 1.0  PROT 7.9  ALBUMIN 3.3*   No results found for this basename: LIPASE, AMYLASE,  in the last 168 hours No results found for this basename: AMMONIA,  in the last 168 hours CBC:  Recent Labs Lab 08/26/13 1643 08/31/13 1649  WBC 6.1 6.0  NEUTROABS 4.0 3.8  HGB 8.5* 9.9*  HCT 28.3* 32.0*  MCV 82.5 80.0  PLT 201 252   Cardiac Enzymes:  Recent Labs Lab 08/31/13 1649  TROPONINI <0.30    BNP (last 3 results)  Recent Labs  07/21/13 0902 08/04/13 1508 08/26/13 1642  PROBNP 9726.0* 4130.0* 6710.0*   CBG: No results found for this basename: GLUCAP,  in the last 168 hours  Radiological Exams on Admission: Dg Chest 1 View  08/31/2013   CLINICAL DATA:  Upper left side chest pain, headaches, history stroke, hypertension, diabetes, asthma, CHF, non ischemic cardiomyopathy, protein C deficiency  EXAM: CHEST - 1 VIEW  COMPARISON:  08/26/2013  FINDINGS: Left subclavian AICD leads project over right atrium and right ventricle.  Significant enlargement of cardiac silhouette with pulmonary vascular congestion.  Persistent  mild perihilar interstitial prominence compatible with minimal chronic failure.  No segmental consolidation, pleural effusion or pneumothorax.  Bones demineralized.  IMPRESSION: Chronic CHF.   Electronically Signed   By: Ulyses Southward M.D.   On: 08/31/2013 18:22   Ct Head Wo Contrast  08/31/2013   CLINICAL DATA:  Chest pain. Head pain. History  of left-sided stroke.  EXAM: CT HEAD WITHOUT CONTRAST  TECHNIQUE: Contiguous axial images were obtained from the base of the skull through the vertex without intravenous contrast.  COMPARISON:  05/31/2013.  FINDINGS: No mass lesion, mass effect, midline shift, hydrocephalus, hemorrhage. No territorial ischemia or acute infarction. Chronic encephalomalacia in the left MCA distribution. Mastoid air cells clear. Paranasal sinuses clear.  IMPRESSION: Old large left MCA infarct.  No acute abnormality.   Electronically Signed   By: Andreas Newport M.D.   On: 08/31/2013 18:15    EKG: Independently reviewed.   Assessment/Plan Active Problems:   Hypokalemia   DM type 2 (diabetes mellitus, type 2)   Chronic systolic CHF (congestive heart failure)   Chest pain   Headache   Chest pain -I'm not sure she has cardiac chest pain, patient is a poor historian. -12-lead EKG showed paced rhythm with multiple PVCs, first set of troponin is negative. -Cycle 3 sets of cardiac enzymes to rule out ACS.  Hypokalemia -Likely secondary to diuretics, patient is on Lasix, Zaroxolyn and low dose of aspirin Aldactone. -Patient seems dehydrated rather than fluid overloaded. -I discontinued the Zaroxolyn, decrease Lasix dose from 40 twice a day to 40 once a day. -Increased the Aldactone dose from 12.5 to 50 mg. -I'll give total of 120 mEq of potassium, give 2 g of IV magnesium. Check potassium and magnesium in a.m.  Diabetes mellitus type 2 -She is on 3 units of NovoLog with meals, DC and start this is on a scale. -If compliance is an issue, can likely be switched to oral medicine  like Tradjenta.  Chronic CHF -With LVEF of 20% on 07/22/2013. Patient has AICD. -Continue metoprolol, lisinopril and adjust diuretics as above. -Diuretic therapy can be further adjusted based on the clinical course.  History of DVT -On Xarelto.   Code Status: Full code Family Communication: Plan discussed with patient Disposition Plan: Observation, telemetry  Time spent: 70 minutes  [facility] Pager [phone]

## 2013-09-01 ENCOUNTER — Ambulatory Visit: Payer: Self-pay | Admitting: Family

## 2013-09-01 DIAGNOSIS — I5022 Chronic systolic (congestive) heart failure: Secondary | ICD-10-CM

## 2013-09-01 DIAGNOSIS — E119 Type 2 diabetes mellitus without complications: Secondary | ICD-10-CM

## 2013-09-01 DIAGNOSIS — E876 Hypokalemia: Secondary | ICD-10-CM

## 2013-09-01 DIAGNOSIS — I509 Heart failure, unspecified: Secondary | ICD-10-CM

## 2013-09-01 LAB — BASIC METABOLIC PANEL
Calcium: 8.8 mg/dL (ref 8.4–10.5)
Creatinine, Ser: 0.92 mg/dL (ref 0.50–1.10)
GFR calc non Af Amer: 69 mL/min — ABNORMAL LOW (ref >90)
Glucose, Bld: 108 mg/dL — ABNORMAL HIGH (ref 70–99)
Potassium: 3.6 mEq/L (ref 3.5–5.1)
Sodium: 137 mEq/L (ref 135–145)

## 2013-09-01 LAB — GLUCOSE, CAPILLARY: Glucose-Capillary: 166 mg/dL — ABNORMAL HIGH (ref 70–99)

## 2013-09-01 LAB — TROPONIN I: Troponin I: <0.3 ng/mL (ref <0.30)

## 2013-09-01 LAB — TSH: TSH: 0.399 u[IU]/mL (ref 0.350–4.500)

## 2013-09-01 MED ORDER — PNEUMOCOCCAL VAC POLYVALENT 25 MCG/0.5ML IJ INJ: 0.5000 mL | INJECTION | INTRAMUSCULAR | Status: DC

## 2013-09-01 MED ORDER — QUETIAPINE FUMARATE ER 150 MG PO TB24: 150.0000 mg | ORAL_TABLET | Freq: Every day | ORAL | Status: DC

## 2013-09-01 MED ORDER — ESCITALOPRAM OXALATE 20 MG PO TABS: 20.0000 mg | ORAL_TABLET | Freq: Every day | ORAL | Status: DC

## 2013-09-01 MED ORDER — ENSURE COMPLETE PO LIQD
237.0000 mL | ORAL | Status: DC
  Administered 2013-09-01: 14:00:00 237 mL via ORAL

## 2013-09-01 MED ORDER — POTASSIUM CHLORIDE 20 MEQ/15ML (10%) PO LIQD: 20.0000 meq | Freq: Every day | ORAL | Status: DC

## 2013-09-01 MED ORDER — POTASSIUM CHLORIDE 20 MEQ/15ML (10%) PO LIQD
20.0000 meq | Freq: Two times a day (BID) | ORAL | Status: DC
  Administered 2013-09-01: 15:00:00 20 meq via ORAL
  Filled 2013-09-01 (×2): qty 15

## 2013-09-01 MED ORDER — METOPROLOL TARTRATE 25 MG PO TABS: 25.0000 mg | ORAL_TABLET | Freq: Two times a day (BID) | ORAL | Status: DC

## 2013-09-01 MED ORDER — DIGOXIN 125 MCG PO TABS: 0.1250 mg | ORAL_TABLET | Freq: Every day | ORAL | Status: DC

## 2013-09-01 MED ORDER — SODIUM CHLORIDE 0.9 % IV BOLUS (SEPSIS)
1000.0000 mL | Freq: Once | INTRAVENOUS | Status: AC
  Administered 2013-09-01: 1000 mL via INTRAVENOUS

## 2013-09-01 MED ORDER — POTASSIUM CHLORIDE CRYS ER 20 MEQ PO TBCR
40.0000 meq | EXTENDED_RELEASE_TABLET | Freq: Two times a day (BID) | ORAL | Status: DC
Start: 2013-09-01 — End: 2013-09-01
  Filled 2013-09-01: qty 2

## 2013-09-01 NOTE — Progress Notes (Signed)
Pt starting crying after intake of 10am meds and pointing to chest and ICD. EKG done, (no change from pervious). Morphine given for pain. MD aware, Charge RN comforting PT. Will continue to monitor

## 2013-09-01 NOTE — Progress Notes (Signed)
UR completed 

## 2013-09-01 NOTE — Progress Notes (Addendum)
Pt alarm 10 beat run of VT, MD reviewed, states it was a false alarm. Suction set up in room d/t hx of Sz. Will continue to monitor

## 2013-09-01 NOTE — Progress Notes (Signed)
CSW received request per nursing to arrange for EMS transport home for patient; she does not have any family that can come get her or manage her in a car.  EMS arranged for pick up; family is at home to await patient per patient's son.  Patient's nurse- Hessie Diener is aware of above. CSW signing off.  [name]. [place]  [phone]

## 2013-09-01 NOTE — Progress Notes (Signed)
Pt up in , RN set up for breakfast, lab did 740 blood draw, Pt refused 830 labs, RN told lab to add BMP n trop to 730 draw. Will continue to monitor Pt

## 2013-09-01 NOTE — Discharge Summary (Addendum)
Physician Discharge Summary  Elizabeth Love ZOX:[id number] DOB: Jun 05, 1956 DOA: 08/31/2013  PCP: Estill Cotta, MD  Admit date: 08/31/2013 Discharge date: 09/01/2013  Time spent: 35 minutes  Recommendations for Outpatient Follow-up:  1. Follow up with PCP in 2 weeks.  Discharge Diagnoses:  Active Problems:   Hypokalemia   DM type 2 (diabetes mellitus, type 2)   Chronic systolic CHF (congestive heart failure)   Chest pain   Headache   Discharge Condition: Stable  Diet recommendation: heart healthy   Filed Weights   08/31/13 1601 08/31/13 2019 09/01/13 0459  Weight: 51.256 kg (113 lb) 51.393 kg (113 lb 4.8 oz) 54.7 kg (120 lb 9.5 oz)    History of present illness:  57 y.o. female with complex past medical history including chronic CHF with LVEF of 20% status post pacemaker/AICD, seizure, history of CVA, DVT and noncompliance. Patient came in to the hospital because of chest pain. Patient is very poor historian and she was crying when I was interviewing her, she was doing fine until yesterday when she started to have generalized pain, muscle cramps and started to have headache today so she came in to the hospital for further evaluation. She said she also has some chest pain and pointing to her defibrillator.   Hospital Course:  Chest pain  - Unlikely Cardiac, her skin is sensitive, actually exquisitely tender. -12-lead EKG showed paced rhythm with multiple PVCs, first set of troponin is negative.  - 3 sets of cardiac enzymes negative. - The area is tender to examination.  Hypokalemia  - Likely secondary to diuretics, patient is on Lasix, Zaroxolyn and low dose of aspirin Aldactone.  - Patient seems dehydrated rather than fluid overloaded.   Held the [place], decrease Lasix dose. - Increased the Aldactone dose 25 mg.  Diabetes mellitus type 2  _ no changes were made.  Chronic CHF  -With LVEF of 20% on 07/22/2013. Patient has AICD.  -Continue metoprolol,  lisinopril.  - resume diuretics.  History of DVT  -On Xarelto.   Procedures:  CXR  Consultations:  none  Discharge Exam: Filed Vitals:   09/01/13 1029  BP: 119/74  Pulse: 75  Temp:   Resp: 20    General: A&O x3 Cardiovascular: RRR Respiratory: good air movement CTA B/L  Discharge Instructions      Discharge Orders   Future Appointments Provider Department Dept Phone   09/08/2013 4:30 PM Sandford Craze, NP [facility] [phone]   10/14/2013 9:05 AM [facility] [employer] [phone]   Future Orders Complete By Expires   Diet - low sodium heart healthy  As directed    Increase activity slowly  As directed        Medication List    STOP taking these medications       potassium chloride SA 20 MEQ tablet  Commonly known as:  K-DUR,KLOR-CON      TAKE these medications       acetaminophen 325 MG tablet  Commonly known as:  TYLENOL  Take 650 mg by mouth every 6 (six) hours as needed for pain.     albuterol (2.5 MG/3ML) 0.083% nebulizer solution  Commonly known as:  PROVENTIL  Take 2.5 mg by nebulization every 6 (six) hours as needed. For cough or wheeze     ALPRAZolam 0.5 MG tablet  Commonly known as:  XANAX  Take 0.5 mg by mouth at bedtime as needed for sleep.  amiodarone 400 MG tablet  Commonly known as:  PACERONE  Take 200 mg by mouth See admin instructions. Takes 200mg  on Mon, Tues, Wed, Thurs and Fri's only (none on weekends)     digoxin 0.125 MG tablet  Commonly known as:  LANOXIN  Take 1 tablet (0.125 mg total) by mouth daily.     escitalopram 20 MG tablet  Commonly known as:  LEXAPRO  Take 1 tablet (20 mg total) by mouth daily.     furosemide 40 MG tablet  Commonly known as:  LASIX  Take 40 mg by mouth 2 (two) times daily.     insulin lispro 100 UNIT/ML injection  Commonly known as:  HUMALOG  Inject into the skin 3 (three) times daily before meals. On sliding scale      levETIRAcetam 1000 MG tablet  Commonly known as:  KEPPRA  Take 2,000 mg by mouth 2 (two) times daily.     levothyroxine 50 MCG tablet  Commonly known as:  SYNTHROID, LEVOTHROID  Take 50 mcg by mouth daily before breakfast.     lisinopril 5 MG tablet  Commonly known as:  PRINIVIL,ZESTRIL  Take 5 mg by mouth daily.     metolazone 2.5 MG tablet  Commonly known as:  ZAROXOLYN  Take 1 tablet (2.5 mg total) by mouth as needed. For fluid retention     metoprolol tartrate 25 MG tablet  Commonly known as:  LOPRESSOR  Take 1 tablet (25 mg total) by mouth 2 (two) times daily.     omeprazole 20 MG capsule  Commonly known as:  PRILOSEC  Take 20 mg by mouth 2 (two) times daily.     oxyCODONE-acetaminophen 10-325 MG per tablet  Commonly known as:  PERCOCET  Take 1 tablet by mouth every 8 (eight) hours as needed for pain.     polyethylene glycol packet  Commonly known as:  MIRALAX / GLYCOLAX  Take 17 g by mouth daily as needed. For constipation     potassium chloride 20 MEQ/15ML (10%) solution  Take 15 mLs (20 mEq total) by mouth daily.     QUEtiapine Fumarate 150 MG 24 hr tablet  Commonly known as:  SEROQUEL XR  Take 1 tablet (150 mg total) by mouth at bedtime.     spironolactone 25 MG tablet  Commonly known as:  ALDACTONE  Take 25 mg by mouth daily.     Vitamin D (Ergocalciferol) 50000 UNITS Caps capsule  Commonly known as:  DRISDOL  Take 50,000 Units by mouth every 30 (thirty) days.     XARELTO 20 MG Tabs tablet  Generic drug:  Rivaroxaban  Take 20 mg by mouth daily.       Allergies  Allergen Reactions  . Penicillins   . Sulfa Antibiotics   . Penicillins Rash  . Sulfa Antibiotics Rash   Follow-up Information   Follow up with [name], [name], MD On 09/08/2013. (hospital follow up 12/22 @ 430pm)    Specialty:  Internal Medicine   Contact information:   [address] [place] [address] [phone]        The results of significant  diagnostics from this hospitalization (including imaging, microbiology, ancillary and laboratory) are listed below for reference.    Significant Diagnostic Studies: Dg Chest 1 View  08/31/2013   CLINICAL DATA:  Upper left side chest pain, headaches, history stroke, hypertension, diabetes, asthma, CHF, non ischemic cardiomyopathy, protein C deficiency  EXAM: CHEST - 1 VIEW  COMPARISON:  08/26/2013  FINDINGS: Left subclavian AICD leads project over right atrium and right ventricle.  Significant enlargement of cardiac silhouette with pulmonary vascular congestion.  Persistent mild perihilar interstitial prominence compatible with minimal chronic failure.  No segmental consolidation, pleural effusion or pneumothorax.  Bones demineralized.  IMPRESSION: Chronic CHF.   Electronically Signed   By: Ulyses Southward M.D.   On: 08/31/2013 18:22   Dg Chest 2 View  08/26/2013   CLINICAL DATA:  Left chest pain, shortness of breath  EXAM: CHEST  2 VIEW  COMPARISON:  08/18/2013  FINDINGS: Left pacer wires/AICD are stable. Marked cardiomegaly with central vascular congestion and chronic basilar scarring versus atelectasis. No definite superimposed pneumonia or CHF. No effusion or pneumothorax. Trachea midline. IVC filter noted. Prior cholecystectomy evident.  IMPRESSION: Cardiomegaly with vascular congestion but no definite CHF, pneumonia or pleural effusion. Overall stable exam.   Electronically Signed   By: Ruel Favors M.D.   On: 08/26/2013 16:36   Dg Chest 2 View  08/18/2013   CLINICAL DATA:  Chest pain  EXAM: CHEST  2 VIEW  COMPARISON:  August 04, 2013  FINDINGS: There is diffuse cardiomegaly with pulmonary venous hypertension. There is upper lobe interstitial edema, stable. Pacemaker leads are attached to the right atrium and middle cardiac vein region, stable. No pneumothorax. No adenopathy.  No new opacity.  IMPRESSION: Congestive heart failure. Slight interstitial edema in the upper lobes. No consolidation. Stable  diffuse cardiac enlargement. Underlying pericardial effusion cannot be excluded radiographically.   Electronically Signed   By: Bretta Bang M.D.   On: 08/18/2013 09:24   Dg Chest 2 View  08/04/2013   CLINICAL DATA:  Chest pain.  EXAM: CHEST  2 VIEW  COMPARISON:  07/19/2013.  FINDINGS: The pacer wires/ AICD are stable. The heart is enlarged but unchanged. There is central vascular congestion and increased interstitial markings but no overt pulmonary edema. No definite pleural effusions or focal infiltrates.  IMPRESSION: Cardiac enlargement and vascular congestion without overt pulmonary edema or pleural effusion.   Electronically Signed   By: Loralie Champagne M.D.   On: 08/04/2013 16:22   Ct Head Wo Contrast  08/31/2013   CLINICAL DATA:  Chest pain. Head pain. History of left-sided stroke.  EXAM: CT HEAD WITHOUT CONTRAST  TECHNIQUE: Contiguous axial images were obtained from the base of the skull through the vertex without intravenous contrast.  COMPARISON:  05/31/2013.  FINDINGS: No mass lesion, mass effect, midline shift, hydrocephalus, hemorrhage. No territorial ischemia or acute infarction. Chronic encephalomalacia in the left MCA distribution. Mastoid air cells clear. Paranasal sinuses clear.  IMPRESSION: Old large left MCA infarct.  No acute abnormality.   Electronically Signed   By: Andreas Newport M.D.   On: 08/31/2013 18:15    Microbiology: No results found for this or any previous visit (from the past 240 hour(s)).   Labs: Basic Metabolic Panel:  Recent Labs Lab 08/26/13 1643 08/31/13 1649 09/01/13 0751  NA 138 136 137  K 3.6 2.7* 3.6  CL 102 93* 98  CO2 25 30 26   GLUCOSE 126* 81 108*  BUN 17 17 14   CREATININE 0.94 0.96 0.92  CALCIUM 9.1 9.3 8.8   Liver Function Tests:  Recent Labs Lab 08/31/13 1649  AST 43*  ALT 22  ALKPHOS 79  BILITOT 1.0  PROT 7.9  ALBUMIN 3.3*   No results found for this basename: LIPASE, AMYLASE,  in the last 168 hours No results found  for this basename: AMMONIA,  in the last  168 hours CBC:  Recent Labs Lab 08/26/13 1643 08/31/13 1649  WBC 6.1 6.0  NEUTROABS 4.0 3.8  HGB 8.5* 9.9*  HCT 28.3* 32.0*  MCV 82.5 80.0  PLT 201 252   Cardiac Enzymes:  Recent Labs Lab 08/31/13 1649 08/31/13 2231 09/01/13 0751  TROPONINI <0.30 <0.30 <0.30   BNP: BNP (last 3 results)  Recent Labs  07/21/13 0902 08/04/13 1508 08/26/13 1642  PROBNP 9726.0* 4130.0* 6710.0*   CBG:  Recent Labs Lab 08/31/13 2105 09/01/13 0606 09/01/13 1108  GLUCAP 107* 109* 177*       Signed:  FELIZ ORTIZ, ABRAHAM  [facility] 09/01/2013, 3:05 PM

## 2013-09-01 NOTE — Progress Notes (Addendum)
INITIAL NUTRITION ASSESSMENT  DOCUMENTATION CODES Per approved criteria  -Not Applicable   INTERVENTION: Add Ensure Complete po daily, each supplement provides 350 kcal and 13 grams of protein. RD to continue to follow nutrition care plan.  NUTRITION DIAGNOSIS: Inadequate oral intake related to AMS and variable appetite as evidenced by limited meal completion.   Goal: Intake to meet >90% of estimated nutrition needs.  Monitor:  weight trends, lab trends, I/O's, PO intake, supplement tolerance  Reason for Assessment: Malnutrition Screening Tool  57 y.o. female  Admitting Dx: chest pain/hypokalemia  ASSESSMENT: PMHx significant for CHF with LVEF of 20% s/p pacemaker/AICD, seizure, hx of CVA and noncompliance. Admitted with chest pain. Work-up reveals hypokalemia, likely secondary to diuretic use.  Pt is a very poor historian. Currently ordered for a Heart Healthy diet. Consumed 25% of breakfast - confirmed with nurse tech who reports that pt did not like breakfast that was sent to her, so an alternate tray was sent to her.  RD attempted to obtain hx from patient, however she wouldn't answer any questions. She took a few bites of her lunch this afternoon - consumed some of her pot roast.  Pt is at nutrition risk given multiple medical issues and noncompliance. Weight has been relatively stable over the past 5 months.  Height: Ht Readings from Last 1 Encounters:  08/31/13 5' (1.524 m)    Weight: Wt Readings from Last 1 Encounters:  09/01/13 120 lb 9.5 oz (54.7 kg)    Ideal Body Weight: 100 lb  % Ideal Body Weight: 120%  Wt Readings from Last 10 Encounters:  09/01/13 120 lb 9.5 oz (54.7 kg)  08/06/13 113 lb 15.7 oz (51.7 kg)  07/25/13 113 lb 14.4 oz (51.665 kg)  07/03/13 116 lb (52.617 kg)  07/03/13 116 lb (52.617 kg)  06/23/13 116 lb (52.617 kg)  06/18/13 116 lb 12.8 oz (52.98 kg)  05/31/13 115 lb (52.164 kg)  04/14/13 115 lb 4.8 oz (52.3 kg)  04/01/13 118 lb  (53.524 kg)    Usual Body Weight: 118 lb  % Usual Body Weight: 102%  BMI:  Body mass index is 23.55 kg/(m^2). Normal weight  Estimated Nutritional Needs: Kcal: 1650 - 1800 kcal Protein: 54 - 65 g Fluid: 1.6 - 1.8 liters  Skin: intact  Diet Order: Cardiac  EDUCATION NEEDS: -No education needs identified at this time   Intake/Output Summary (Last 24 hours) at 09/01/13 1122 Last data filed at 09/01/13 1029  Gross per 24 hour  Intake    240 ml  Output    200 ml  Net     40 ml    Last BM: PTA  Labs:   Recent Labs Lab 08/26/13 1643 08/31/13 1649 09/01/13 0751  NA 138 136 137  K 3.6 2.7* 3.6  CL 102 93* 98  CO2 25 30 26   BUN 17 17 14   CREATININE 0.94 0.96 0.92  CALCIUM 9.1 9.3 8.8  GLUCOSE 126* 81 108*    CBG (last 3)   Recent Labs  08/31/13 2105 09/01/13 0606 09/01/13 1108  GLUCAP 107* 109* 177*    Scheduled Meds: . amiodarone  200 mg Oral Custom  . digoxin  0.125 mg Oral Daily  . escitalopram  20 mg Oral Daily  . furosemide  40 mg Oral Daily  . insulin aspart  0-9 Units Subcutaneous TID WC  . levETIRAcetam  2,000 mg Oral BID  . levothyroxine  50 mcg Oral QAC breakfast  . lisinopril  2.5 mg Oral  Daily  . metoprolol tartrate  25 mg Oral BID  . pantoprazole  40 mg Oral Daily  . QUEtiapine Fumarate  150 mg Oral QHS  . Rivaroxaban  20 mg Oral Q supper  . sodium chloride  3 mL Intravenous Q12H  . spironolactone  50 mg Oral Daily  . terazosin  2 mg Oral QHS  . Vitamin D (Ergocalciferol)  50,000 Units Oral Q30 days    Continuous Infusions:  none  Past Medical History  Diagnosis Date  . Seizures   . Hypertension   . Stroke     2007 - R sided weakness and expressive aphasia with h/o behavioral problems related to this  . Asthma   . Diabetes mellitus   . Chronic systolic CHF (congestive heart failure)   . Atrial fibrillation     a. On Xarelto after having DVT (prev felt to be poor anticoag cand 2/2 noncompliance).  . History of DVT (deep  vein thrombosis)     a. 06/2012: RLE DVT, + protein C deficiency, placed on Xarelto. s/p IVC filter  . Noncompliance     Due to mental status, family has had to coax her to take medicines  . Hypothyroidism   . VT (ventricular tachycardia)     a. h/o ICD ~2007-2008. b. Adm 08/2012 with UTI, had VT/ICD shock x 5 (hypokalemic);  c. 06/2013 s/p ICD Gen change: SJM DC ICD ser #: [id number]  . Protein C deficiency   . NICM (nonischemic cardiomyopathy)     a. s/p AICD ~2007 ([employer]) - previous care [place]. No known hx CAD. b. EF 10% by echo 04/2012;  c. 06/2013 s/p ICD Gen change: SJM DC ICD ser #: [id number]  . 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 Cholecystectomy    . Appendectomy    . Pacemaker insertion    . Vena cava filter placement  07/19/2012    Procedure: INSERTION VENA-CAVA FILTER;  Surgeon: Larina Earthly, MD;  Location: [facility] OR;  Service: Vascular;  Laterality: [name];  . Insert / replace / remove pacemaker      ICD  . Abdominal hysterectomy      Jarold Motto MS, RD, LDN Pager: [phone] After-hours pager: [phone]

## 2013-09-01 NOTE — Progress Notes (Signed)
Pt d/c home. D/c instructions and medications reviewed. Pt states understanding. All pt questions answered

## 2013-09-08 ENCOUNTER — Ambulatory Visit: Payer: Self-pay | Admitting: Family

## 2013-09-16 ENCOUNTER — Encounter (HOSPITAL_COMMUNITY): Payer: Self-pay | Admitting: Cardiology

## 2013-09-16 ENCOUNTER — Telehealth (HOSPITAL_COMMUNITY): Payer: Self-pay | Admitting: Cardiology

## 2013-09-16 NOTE — Telephone Encounter (Signed)
Attempting to contact pt for follow up appt I have been unable to reach this patient by phone.  A letter is being sent to the last known home address.

## 2013-09-19 ENCOUNTER — Emergency Department (HOSPITAL_COMMUNITY)
Admission: EM | Admit: 2013-09-19 | Discharge: 2013-09-20 | Disposition: A | Discharge status: Home / Self Care | Payer: Medicare Other | Attending: Emergency Medicine | Admitting: Emergency Medicine

## 2013-09-19 ENCOUNTER — Emergency Department (HOSPITAL_COMMUNITY): Payer: Medicare Other

## 2013-09-19 ENCOUNTER — Other Ambulatory Visit: Payer: Self-pay

## 2013-09-19 ENCOUNTER — Encounter (HOSPITAL_COMMUNITY): Payer: Self-pay | Admitting: Emergency Medicine

## 2013-09-19 DIAGNOSIS — T82198A Other mechanical complication of other cardiac electronic device, initial encounter: Secondary | ICD-10-CM | POA: Insufficient documentation

## 2013-09-19 DIAGNOSIS — Z8673 Personal history of transient ischemic attack (TIA), and cerebral infarction without residual deficits: Secondary | ICD-10-CM | POA: Insufficient documentation

## 2013-09-19 DIAGNOSIS — Z79899 Other long term (current) drug therapy: Secondary | ICD-10-CM | POA: Insufficient documentation

## 2013-09-19 DIAGNOSIS — R079 Chest pain, unspecified: Secondary | ICD-10-CM

## 2013-09-19 DIAGNOSIS — E119 Type 2 diabetes mellitus without complications: Secondary | ICD-10-CM | POA: Insufficient documentation

## 2013-09-19 DIAGNOSIS — Z87891 Personal history of nicotine dependence: Secondary | ICD-10-CM | POA: Insufficient documentation

## 2013-09-19 DIAGNOSIS — J45909 Unspecified asthma, uncomplicated: Secondary | ICD-10-CM | POA: Insufficient documentation

## 2013-09-19 DIAGNOSIS — Z9119 Patient's noncompliance with other medical treatment and regimen: Secondary | ICD-10-CM | POA: Insufficient documentation

## 2013-09-19 DIAGNOSIS — Z794 Long term (current) use of insulin: Secondary | ICD-10-CM | POA: Insufficient documentation

## 2013-09-19 DIAGNOSIS — Z7901 Long term (current) use of anticoagulants: Secondary | ICD-10-CM | POA: Insufficient documentation

## 2013-09-19 DIAGNOSIS — Z880 Allergy status to penicillin: Secondary | ICD-10-CM | POA: Insufficient documentation

## 2013-09-19 DIAGNOSIS — R4701 Aphasia: Secondary | ICD-10-CM | POA: Insufficient documentation

## 2013-09-19 DIAGNOSIS — G40909 Epilepsy, unspecified, not intractable, without status epilepticus: Secondary | ICD-10-CM | POA: Insufficient documentation

## 2013-09-19 DIAGNOSIS — Z87448 Personal history of other diseases of urinary system: Secondary | ICD-10-CM | POA: Insufficient documentation

## 2013-09-19 DIAGNOSIS — I10 Essential (primary) hypertension: Secondary | ICD-10-CM | POA: Insufficient documentation

## 2013-09-19 DIAGNOSIS — I4891 Unspecified atrial fibrillation: Secondary | ICD-10-CM | POA: Insufficient documentation

## 2013-09-19 DIAGNOSIS — I5022 Chronic systolic (congestive) heart failure: Secondary | ICD-10-CM | POA: Insufficient documentation

## 2013-09-19 DIAGNOSIS — Z86718 Personal history of other venous thrombosis and embolism: Secondary | ICD-10-CM | POA: Insufficient documentation

## 2013-09-19 DIAGNOSIS — Z9089 Acquired absence of other organs: Secondary | ICD-10-CM | POA: Insufficient documentation

## 2013-09-19 DIAGNOSIS — Y831 Surgical operation with implant of artificial internal device as the cause of abnormal reaction of the patient, or of later complication, without mention of misadventure at the time of the procedure: Secondary | ICD-10-CM | POA: Insufficient documentation

## 2013-09-19 DIAGNOSIS — Z91199 Patient's noncompliance with other medical treatment and regimen due to unspecified reason: Secondary | ICD-10-CM | POA: Insufficient documentation

## 2013-09-19 DIAGNOSIS — E039 Hypothyroidism, unspecified: Secondary | ICD-10-CM | POA: Insufficient documentation

## 2013-09-19 LAB — CBC
HCT: 31 % — ABNORMAL LOW (ref 36.0–46.0)
Hemoglobin: 9.3 g/dL — ABNORMAL LOW (ref 12.0–15.0)
MCH: 24.4 pg — ABNORMAL LOW (ref 26.0–34.0)
MCHC: 30 g/dL (ref 30.0–36.0)
MCV: 81.4 fL (ref 78.0–100.0)
PLATELETS: 258 10*3/uL (ref 150–400)
RBC: 3.81 MIL/uL — ABNORMAL LOW (ref 3.87–5.11)
RDW: 18.9 % — ABNORMAL HIGH (ref 11.5–15.5)
WBC: 6.8 10*3/uL (ref 4.0–10.5)

## 2013-09-19 LAB — COMPREHENSIVE METABOLIC PANEL
ALK PHOS: 86 U/L (ref 39–117)
ALT: 32 U/L (ref 0–35)
AST: 56 U/L — AB (ref 0–37)
Albumin: 3.1 g/dL — ABNORMAL LOW (ref 3.5–5.2)
BILIRUBIN TOTAL: 0.7 mg/dL (ref 0.3–1.2)
BUN: 20 mg/dL (ref 6–23)
CO2: 26 meq/L (ref 19–32)
Calcium: 8.8 mg/dL (ref 8.4–10.5)
Chloride: 101 mEq/L (ref 96–112)
Creatinine, Ser: 1.02 mg/dL (ref 0.50–1.10)
GFR calc Af Amer: 70 mL/min — ABNORMAL LOW (ref >90)
GFR, EST NON AFRICAN AMERICAN: 61 mL/min — AB (ref >90)
Glucose, Bld: 147 mg/dL — ABNORMAL HIGH (ref 70–99)
Potassium: 3.6 mEq/L — ABNORMAL LOW (ref 3.7–5.3)
SODIUM: 140 meq/L (ref 137–147)
Total Protein: 7.9 g/dL (ref 6.0–8.3)

## 2013-09-19 LAB — POCT I-STAT TROPONIN I: Troponin i, poc: 0.08 ng/mL (ref 0.00–0.08)

## 2013-09-19 LAB — PROTIME-INR
INR: 3.31 — ABNORMAL HIGH (ref 0.00–1.49)
Prothrombin Time: 32.4 seconds — ABNORMAL HIGH (ref 11.6–15.2)

## 2013-09-19 LAB — PRO B NATRIURETIC PEPTIDE: Pro B Natriuretic peptide (BNP): 3631 pg/mL — ABNORMAL HIGH (ref 0–125)

## 2013-09-19 MED ORDER — ASPIRIN 81 MG PO CHEW
324.0000 mg | CHEWABLE_TABLET | Freq: Once | ORAL | Status: AC
  Administered 2013-09-19: 324 mg via ORAL
  Filled 2013-09-19: qty 4

## 2013-09-19 NOTE — Discharge Instructions (Signed)
As discussed, it is important that you follow up as soon as possible with your physician for continued management of your condition. ° °If you develop any new, or concerning changes in your condition, please return to the emergency department immediately. ° °

## 2013-09-19 NOTE — ED Provider Notes (Signed)
CSN: [id number]     Arrival date & time 09/19/13  1753 History   First MD Initiated Contact with Patient 09/19/13 1756     Chief Complaint  Patient presents with  . Chest Pain    HPI  Patient presents with concerns of chest pain.  She said she does have some pain.  Today following a firing of her defibrillator she developed increased pain.  The pain is focally about the left upper chest.  There is no no associated dyspnea, syncope, vomiting, diarrhea. History of present illness is somewhat limited secondary to the patient's expressive aphasia from a prior stroke. She denies neurologic changes. No relief with anything of her chest pain.   Past Medical History  Diagnosis Date  . Seizures   . Hypertension   . Stroke     2007 - R sided weakness and expressive aphasia with h/o behavioral problems related to this  . Asthma   . Diabetes mellitus   . Chronic systolic CHF (congestive heart failure)   . Atrial fibrillation     a. On Xarelto after having DVT (prev felt to be poor anticoag cand 2/2 noncompliance).  . History of DVT (deep vein thrombosis)     a. 06/2012: RLE DVT, + protein C deficiency, placed on Xarelto. s/p IVC filter  . Noncompliance     Due to mental status, family has had to coax her to take medicines  . Hypothyroidism   . VT (ventricular tachycardia)     a. h/o ICD ~2007-2008. b. Adm 08/2012 with UTI, had VT/ICD shock x 5 (hypokalemic);  c. 06/2013 s/p ICD Gen change: SJM DC ICD ser #: [id number]  . Protein C deficiency   . NICM (nonischemic cardiomyopathy)     a. s/p AICD ~2007 ([employer] Jude) - previous care [place]. No known hx CAD. b. EF 10% by echo 04/2012;  c. 06/2013 s/p ICD Gen change: SJM DC ICD ser #: [id number]  . 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 Cholecystectomy    . Appendectomy    . Pacemaker insertion    . Vena cava filter placement  07/19/2012    Procedure: INSERTION VENA-CAVA FILTER;  Surgeon:  Larina Earthly, MD;  Location: [facility] OR;  Service: Vascular;  Laterality: [name];  . Insert / replace / remove pacemaker      ICD  . Abdominal hysterectomy     Family History  Problem Relation Age of Onset  . Hypertension Mother   . Coronary artery disease Neg Hx    History  Substance Use Topics  . Smoking status: Former Games developer  . Smokeless tobacco: Never Used  . Alcohol Use: No   OB History   Grav Para Term Preterm Abortions TAB SAB Ect Mult Living                 Review of Systems  Unable to perform ROS: Patient nonverbal    Allergies  Penicillins; Sulfa antibiotics; Penicillins; and Sulfa antibiotics  Home Medications   Current Outpatient Rx  Name  Route  Sig  Dispense  Refill  . acetaminophen (TYLENOL) 325 MG tablet   Oral   Take 650 mg by mouth every 6 (six) hours as needed for pain.         Marland Kitchen albuterol (PROVENTIL) (2.5 MG/3ML) 0.083% nebulizer solution   Nebulization   Take 2.5 mg by nebulization every 6 (six) hours as needed. For cough  or wheeze         . ALPRAZolam (XANAX) 0.5 MG tablet   Oral   Take 0.5 mg by mouth at bedtime as needed for sleep.         Marland Kitchen amiodarone (PACERONE) 400 MG tablet   Oral   Take 200 mg by mouth See admin instructions. Takes 200mg  on Mon, Tues, Wed, Thurs and Fri's only (none on weekends)         . digoxin (LANOXIN) 0.125 MG tablet   Oral   Take 1 tablet (0.125 mg total) by mouth daily.   30 tablet   0   . escitalopram (LEXAPRO) 20 MG tablet   Oral   Take 1 tablet (20 mg total) by mouth daily.         . furosemide (LASIX) 40 MG tablet   Oral   Take 40 mg by mouth 2 (two) times daily.         . insulin lispro (HUMALOG) 100 UNIT/ML injection   Subcutaneous   Inject into the skin 3 (three) times daily before meals. On sliding scale         . levETIRAcetam (KEPPRA) 1000 MG tablet   Oral   Take 2,000 mg by mouth 2 (two) times daily.         Marland Kitchen levothyroxine (SYNTHROID, LEVOTHROID) 50 MCG tablet   Oral   Take  50 mcg by mouth daily before breakfast.         . lisinopril (PRINIVIL,ZESTRIL) 5 MG tablet   Oral   Take 5 mg by mouth daily.         . metolazone (ZAROXOLYN) 2.5 MG tablet   Oral   Take 1 tablet (2.5 mg total) by mouth as needed. For fluid retention   5 tablet   1   . metoprolol tartrate (LOPRESSOR) 25 MG tablet   Oral   Take 1 tablet (25 mg total) by mouth 2 (two) times daily.         Marland Kitchen omeprazole (PRILOSEC) 20 MG capsule   Oral   Take 20 mg by mouth 2 (two) times daily.         Marland Kitchen oxyCODONE-acetaminophen (PERCOCET) 10-325 MG per tablet   Oral   Take 1 tablet by mouth every 8 (eight) hours as needed for pain.         . polyethylene glycol (MIRALAX / GLYCOLAX) packet   Oral   Take 17 g by mouth daily as needed. For constipation         . potassium chloride 20 MEQ/15ML (10%) solution   Oral   Take 15 mLs (20 mEq total) by mouth daily.   500 mL   0   . QUEtiapine Fumarate (SEROQUEL XR) 150 MG 24 hr tablet   Oral   Take 1 tablet (150 mg total) by mouth at bedtime.   30 tablet   0   . Rivaroxaban (XARELTO) 20 MG TABS tablet   Oral   Take 20 mg by mouth daily.         Marland Kitchen spironolactone (ALDACTONE) 25 MG tablet   Oral   Take 25 mg by mouth daily.         . Vitamin D, Ergocalciferol, (DRISDOL) 50000 UNITS CAPS capsule   Oral   Take 50,000 Units by mouth every 30 (thirty) days.          BP 114/53  Pulse 81  Temp(Src) 98.5 F (36.9 C) (Oral)  Resp 17  Wt  110 lb (49.896 kg)  SpO2 98% Physical Exam  Nursing note and vitals reviewed. Constitutional: She appears well-developed and well-nourished.  HENT:  Head: Normocephalic and atraumatic.  Eyes: Pupils are equal, round, and reactive to light.  Neck: Normal range of motion.  Cardiovascular: Normal rate and intact distal pulses.   Pulmonary/Chest: Effort normal and breath sounds normal. No respiratory distress. She has no wheezes. She exhibits no tenderness (no appreciable wound at defib site).   Sat 98% room air  Abdominal: She exhibits no distension. There is no tenderness. There is no rebound and no guarding.  Musculoskeletal: She exhibits no edema.  Neurological: She is alert. She has normal reflexes. No cranial nerve deficit. She exhibits normal muscle tone.  Residual right-sided weakness. Review of previous notes unchanged from baseline (previous CVA). Patient also with expressive aphasia. No new motor or sensory issues noted. Not able to test coordination as patient is uncooperative with exam  Skin: Skin is warm and dry.  Psychiatric: She is slowed. She is noncommunicative.    ED Course  Procedures (including critical care time) Labs Review Labs Reviewed  PRO B NATRIURETIC PEPTIDE  CBC  COMPREHENSIVE METABOLIC PANEL  PROTIME-INR   Imaging Review No results found.  EKG Interpretation   None      EKG has sinus rhythm, rate 89, artifacts, intraventricular conduction delay, no significant changes from prior.  I reviewed the patient's chart, including recent hospitalization.  10:51 PM Patient asleep, clearly in no distress   Patient's labs notable for appropriate anticoagulation, decreasing BNP from recent admission, no notable abnormalities. MDM   1. Chest pain    Patient presents with chest pain.  Notably, the patient is a long history of pain and well-documented poor cardiac function. On initial exam the patient appears uncomfortable, but was sleeping during her second exam. Patient's history is substantial, but this evaluation is largely reassuring, with decreased BNP from recent admission, no evidence of infection, no evidence of significant ongoing coronary ischemia, no evidence of distress.  Patient was discharged in stable condition, though her medical complexity is noted, to followup with her primary care physician.   Gerhard Munchobert [place] Perry, MD 09/19/13 2306

## 2013-09-19 NOTE — ED Notes (Signed)
Attempted to interrogate pt's pacemaker.  Pt unaware of brand.  Attempted [employer] and [employer] without success.  Primary RN and EDP Jeraldine Loots notified of same.

## 2013-09-19 NOTE — ED Notes (Signed)
Called [facility] and is making arrangements to pick up patient.

## 2013-09-19 NOTE — ED Notes (Signed)
Patient presents via EMS with a chief complaint of chest pain that began today. Patient reports she felt her defibrillator fire this afternoon around 1pm and, patient reports chest pain 10/10, patient refused IV/meds from EMS

## 2013-10-06 ENCOUNTER — Other Ambulatory Visit: Payer: Self-pay

## 2013-10-06 MED ORDER — AMIODARONE HCL 400 MG PO TABS: 200.0000 mg | ORAL_TABLET | ORAL | Status: DC

## 2013-10-12 ENCOUNTER — Emergency Department (HOSPITAL_COMMUNITY): Payer: Medicare Other

## 2013-10-12 ENCOUNTER — Emergency Department (HOSPITAL_COMMUNITY)
Admission: EM | Admit: 2013-10-12 | Discharge: 2013-10-12 | Disposition: A | Discharge status: Home / Self Care | Payer: Medicare Other | Attending: Emergency Medicine | Admitting: Emergency Medicine

## 2013-10-12 ENCOUNTER — Encounter (HOSPITAL_COMMUNITY): Payer: Self-pay | Admitting: Emergency Medicine

## 2013-10-12 DIAGNOSIS — Z79899 Other long term (current) drug therapy: Secondary | ICD-10-CM | POA: Diagnosis not present

## 2013-10-12 DIAGNOSIS — Z7901 Long term (current) use of anticoagulants: Secondary | ICD-10-CM | POA: Insufficient documentation

## 2013-10-12 DIAGNOSIS — E039 Hypothyroidism, unspecified: Secondary | ICD-10-CM | POA: Diagnosis not present

## 2013-10-12 DIAGNOSIS — Z794 Long term (current) use of insulin: Secondary | ICD-10-CM | POA: Diagnosis not present

## 2013-10-12 DIAGNOSIS — I5022 Chronic systolic (congestive) heart failure: Secondary | ICD-10-CM | POA: Diagnosis not present

## 2013-10-12 DIAGNOSIS — R5383 Other fatigue: Secondary | ICD-10-CM | POA: Diagnosis not present

## 2013-10-12 DIAGNOSIS — I10 Essential (primary) hypertension: Secondary | ICD-10-CM | POA: Diagnosis not present

## 2013-10-12 DIAGNOSIS — E119 Type 2 diabetes mellitus without complications: Secondary | ICD-10-CM | POA: Diagnosis not present

## 2013-10-12 DIAGNOSIS — Z9119 Patient's noncompliance with other medical treatment and regimen: Secondary | ICD-10-CM | POA: Insufficient documentation

## 2013-10-12 DIAGNOSIS — G40909 Epilepsy, unspecified, not intractable, without status epilepticus: Secondary | ICD-10-CM | POA: Insufficient documentation

## 2013-10-12 DIAGNOSIS — Z91199 Patient's noncompliance with other medical treatment and regimen due to unspecified reason: Secondary | ICD-10-CM | POA: Insufficient documentation

## 2013-10-12 DIAGNOSIS — Z950 Presence of cardiac pacemaker: Secondary | ICD-10-CM | POA: Diagnosis not present

## 2013-10-12 DIAGNOSIS — Z86718 Personal history of other venous thrombosis and embolism: Secondary | ICD-10-CM | POA: Diagnosis not present

## 2013-10-12 DIAGNOSIS — I509 Heart failure, unspecified: Secondary | ICD-10-CM

## 2013-10-12 DIAGNOSIS — I4891 Unspecified atrial fibrillation: Secondary | ICD-10-CM | POA: Insufficient documentation

## 2013-10-12 DIAGNOSIS — R5381 Other malaise: Secondary | ICD-10-CM | POA: Diagnosis not present

## 2013-10-12 DIAGNOSIS — Z9089 Acquired absence of other organs: Secondary | ICD-10-CM | POA: Diagnosis not present

## 2013-10-12 DIAGNOSIS — Z87448 Personal history of other diseases of urinary system: Secondary | ICD-10-CM | POA: Diagnosis not present

## 2013-10-12 DIAGNOSIS — Z8673 Personal history of transient ischemic attack (TIA), and cerebral infarction without residual deficits: Secondary | ICD-10-CM | POA: Insufficient documentation

## 2013-10-12 DIAGNOSIS — J45909 Unspecified asthma, uncomplicated: Secondary | ICD-10-CM | POA: Diagnosis not present

## 2013-10-12 DIAGNOSIS — R0789 Other chest pain: Secondary | ICD-10-CM | POA: Diagnosis present

## 2013-10-12 DIAGNOSIS — Z880 Allergy status to penicillin: Secondary | ICD-10-CM | POA: Diagnosis not present

## 2013-10-12 DIAGNOSIS — Z87891 Personal history of nicotine dependence: Secondary | ICD-10-CM | POA: Diagnosis not present

## 2013-10-12 DIAGNOSIS — G4700 Insomnia, unspecified: Secondary | ICD-10-CM | POA: Diagnosis not present

## 2013-10-12 LAB — COMPREHENSIVE METABOLIC PANEL
ALK PHOS: 86 U/L (ref 39–117)
ALT: 19 U/L (ref 0–35)
AST: 36 U/L (ref 0–37)
Albumin: 3.3 g/dL — ABNORMAL LOW (ref 3.5–5.2)
BUN: 26 mg/dL — AB (ref 6–23)
CO2: 20 mEq/L (ref 19–32)
Calcium: 9.5 mg/dL (ref 8.4–10.5)
Chloride: 107 mEq/L (ref 96–112)
Creatinine, Ser: 0.9 mg/dL (ref 0.50–1.10)
GFR calc non Af Amer: 71 mL/min — ABNORMAL LOW (ref >90)
GFR, EST AFRICAN AMERICAN: 82 mL/min — AB (ref >90)
GLUCOSE: 187 mg/dL — AB (ref 70–99)
POTASSIUM: 4.2 meq/L (ref 3.7–5.3)
Sodium: 142 mEq/L (ref 137–147)
Total Bilirubin: 1.3 mg/dL — ABNORMAL HIGH (ref 0.3–1.2)
Total Protein: 8.1 g/dL (ref 6.0–8.3)

## 2013-10-12 LAB — URINALYSIS, ROUTINE W REFLEX MICROSCOPIC
BILIRUBIN URINE: NEGATIVE
GLUCOSE, UA: NEGATIVE mg/dL
HGB URINE DIPSTICK: NEGATIVE
Ketones, ur: NEGATIVE mg/dL
Leukocytes, UA: NEGATIVE
Nitrite: NEGATIVE
Protein, ur: NEGATIVE mg/dL
Specific Gravity, Urine: 1.009 (ref 1.005–1.030)
UROBILINOGEN UA: 1 mg/dL (ref 0.0–1.0)
pH: 5 (ref 5.0–8.0)

## 2013-10-12 LAB — CBC WITH DIFFERENTIAL/PLATELET
Basophils Absolute: 0 10*3/uL (ref 0.0–0.1)
Basophils Relative: 0 % (ref 0–1)
EOS ABS: 0 10*3/uL (ref 0.0–0.7)
Eosinophils Relative: 1 % (ref 0–5)
HEMATOCRIT: 28.3 % — AB (ref 36.0–46.0)
HEMOGLOBIN: 8.4 g/dL — AB (ref 12.0–15.0)
LYMPHS ABS: 1.1 10*3/uL (ref 0.7–4.0)
Lymphocytes Relative: 18 % (ref 12–46)
MCH: 23.3 pg — AB (ref 26.0–34.0)
MCHC: 29.7 g/dL — ABNORMAL LOW (ref 30.0–36.0)
MCV: 78.4 fL (ref 78.0–100.0)
MONOS PCT: 10 % (ref 3–12)
Monocytes Absolute: 0.6 10*3/uL (ref 0.1–1.0)
Neutro Abs: 4.3 10*3/uL (ref 1.7–7.7)
Neutrophils Relative %: 72 % (ref 43–77)
Platelets: 245 10*3/uL (ref 150–400)
RBC: 3.61 MIL/uL — ABNORMAL LOW (ref 3.87–5.11)
RDW: 18.3 % — ABNORMAL HIGH (ref 11.5–15.5)
WBC: 5.9 10*3/uL (ref 4.0–10.5)

## 2013-10-12 LAB — TROPONIN I

## 2013-10-12 LAB — DIGOXIN LEVEL: Digoxin Level: <0.3 ng/mL — ABNORMAL LOW (ref 0.8–2.0)

## 2013-10-12 MED ORDER — IOHEXOL 300 MG/ML  SOLN: 50.0000 mL | Freq: Once | INTRAMUSCULAR | Status: DC | PRN

## 2013-10-12 MED ORDER — IOHEXOL 300 MG/ML  SOLN
100.0000 mL | Freq: Once | INTRAMUSCULAR | Status: AC | PRN
  Administered 2013-10-12: 100 mL via INTRAVENOUS

## 2013-10-12 MED ORDER — FUROSEMIDE 10 MG/ML IJ SOLN
40.0000 mg | Freq: Once | INTRAMUSCULAR | Status: AC
  Administered 2013-10-12: 40 mg via INTRAVENOUS
  Filled 2013-10-12: qty 4

## 2013-10-12 NOTE — ED Notes (Signed)
Report received, assumed care.  

## 2013-10-12 NOTE — ED Notes (Addendum)
Pt continues to be drowsy, sleepy & arouses only to mild painful stimuli. HOB elevated, sitting upright, encouraged to drink oral contrast. Will not stay awake to drink. ED MD informed & aware.  Remains NSR on monitor, resp e/u, no distress.

## 2013-10-12 NOTE — ED Notes (Signed)
Patient transported to CT 

## 2013-10-12 NOTE — Discharge Instructions (Signed)
Increase your lasix to 2 tablets a day for the next 3 days and then go back to normal dose.  Heart Failure Heart failure is a condition in which the heart has trouble pumping blood. This means your heart does not pump blood efficiently for your body to work well. In some cases of heart failure, fluid may back up into your lungs or you may have swelling (edema) in your lower legs. Heart failure is usually a long-term (chronic) condition. It is important for you to take good care of yourself and follow your caregiver's treatment plan. CAUSES  Some health conditions can cause heart failure. Those health conditions include:  High blood pressure (hypertension) causes the heart muscle to work harder than normal. When pressure in the blood vessels is high, the heart needs to pump (contract) with more force in order to circulate blood throughout the body. High blood pressure eventually causes the heart to become stiff and weak.  Coronary artery disease (CAD) is the buildup of cholesterol and fat (plaque) in the arteries of the heart. The blockage in the arteries deprives the heart muscle of oxygen and blood. This can cause chest pain and may lead to a heart attack. High blood pressure can also contribute to CAD.  Heart attack (myocardial infarction) occurs when 1 or more arteries in the heart become blocked. The loss of oxygen damages the muscle tissue of the heart. When this happens, part of the heart muscle dies. The injured tissue does not contract as well and weakens the heart's ability to pump blood.  Abnormal heart valves can cause heart failure when the heart valves do not open and close properly. This makes the heart muscle pump harder to keep the blood flowing.  Heart muscle disease (cardiomyopathy or myocarditis) is damage to the heart muscle from a variety of causes. These can include drug or alcohol abuse, infections, or unknown reasons. These can increase the risk of heart failure.  Lung  disease makes the heart work harder because the lungs do not work properly. This can cause a strain on the heart, leading it to fail.  Diabetes increases the risk of heart failure. High blood sugar contributes to high fat (lipid) levels in the blood. Diabetes can also cause slow damage to tiny blood vessels that carry important nutrients to the heart muscle. When the heart does not get enough oxygen and food, it can cause the heart to become weak and stiff. This leads to a heart that does not contract efficiently.  Other conditions can contribute to heart failure. These include abnormal heart rhythms, thyroid problems, and low blood counts (anemia). Certain unhealthy behaviors can increase the risk of heart failure. Those unhealthy behaviors include:  Being overweight.  Smoking or chewing tobacco.  Eating foods high in fat and cholesterol.  Abusing illicit drugs or alcohol.  Lacking physical activity. SYMPTOMS  Heart failure symptoms may vary and can be hard to detect. Symptoms may include:  Shortness of breath with activity, such as climbing stairs.  Persistent cough.  Swelling of the feet, ankles, legs, or abdomen.  Unexplained weight gain.  Difficulty breathing when lying flat (orthopnea).  Waking from sleep because of the need to sit up and get more air.  Rapid heartbeat.  Fatigue and loss of energy.  Feeling lightheaded, dizzy, or close to fainting.  Loss of appetite.  Nausea.  Increased urination during the night (nocturia). DIAGNOSIS  A diagnosis of heart failure is based on your history, symptoms, physical examination, and  diagnostic tests. Diagnostic tests for heart failure may include:  Echocardiography.  Electrocardiography.  Chest X-ray.  Blood tests.  Exercise stress test.  Cardiac angiography.  Radionuclide scans. TREATMENT  Treatment is aimed at managing the symptoms of heart failure. Medicines, behavioral changes, or surgical intervention  may be necessary to treat heart failure.  Medicines to help treat heart failure may include:  Angiotensin-converting enzyme (ACE) inhibitors. This type of medicine blocks the effects of a blood protein called angiotensin-converting enzyme. ACE inhibitors relax (dilate) the blood vessels and help lower blood pressure.  Angiotensin receptor blockers. This type of medicine blocks the actions of a blood protein called angiotensin. Angiotensin receptor blockers dilate the blood vessels and help lower blood pressure.  Water pills (diuretics). Diuretics cause the kidneys to remove salt and water from the blood. The extra fluid is removed through urination. This loss of extra fluid lowers the volume of blood the heart pumps.  Beta blockers. These prevent the heart from beating too fast and improve heart muscle strength.  Digitalis. This increases the force of the heartbeat.  Healthy behavior changes include:  Obtaining and maintaining a healthy weight.  Stopping smoking or chewing tobacco.  Eating heart healthy foods.  Limiting or avoiding alcohol.  Stopping illicit drug use.  Physical activity as directed by your caregiver.  Surgical treatment for heart failure may include:  A procedure to open blocked arteries, repair damaged heart valves, or remove damaged heart muscle tissue.  A pacemaker to improve heart muscle function and control certain abnormal heart rhythms.  An internal cardioverter defibrillator to treat certain serious abnormal heart rhythms.  A left ventricular assist device to assist the pumping ability of the heart. HOME CARE INSTRUCTIONS   Take your medicine as directed by your caregiver. Medicines are important in reducing the workload of your heart, slowing the progression of heart failure, and improving your symptoms.  Do not stop taking your medicine unless directed by your caregiver.  Do not skip any dose of medicine.  Refill your prescriptions before you  run out of medicine. Your medicines are needed every day.  Take over-the-counter medicine only as directed by your caregiver or pharmacist.  Engage in moderate physical activity if directed by your caregiver. Moderate physical activity can benefit some people. The elderly and people with severe heart failure should consult with a caregiver for physical activity recommendations.  Eat heart healthy foods. Food choices should be free of trans fat and low in saturated fat, cholesterol, and salt (sodium). Healthy choices include fresh or frozen fruits and vegetables, fish, lean meats, legumes, fat-free or low-fat dairy products, and whole grain or high fiber foods. Talk to a dietitian to learn more about heart healthy foods.  Limit sodium if directed by your caregiver. Sodium restriction may reduce symptoms of heart failure in some people. Talk to a dietitian to learn more about heart healthy seasonings.  Use healthy cooking methods. Healthy cooking methods include roasting, grilling, broiling, baking, poaching, steaming, or stir-frying. Talk to a dietitian to learn more about healthy cooking methods.  Limit fluids if directed by your caregiver. Fluid restriction may reduce symptoms of heart failure in some people.  Weigh yourself every day. Daily weights are important in the early recognition of excess fluid. You should weigh yourself every morning after you urinate and before you eat breakfast. Wear the same amount of clothing each time you weigh yourself. Record your daily weight. Provide your caregiver with your weight record.  Monitor and record your  blood pressure if directed by your caregiver.  Check your pulse if directed by your caregiver.  Lose weight if directed by your caregiver. Weight loss may reduce symptoms of heart failure in some people.  Stop smoking or chewing tobacco. Nicotine makes your heart work harder by causing your blood vessels to constrict. Do not use nicotine gum or  patches before talking to your caregiver.  Schedule and attend follow-up visits as directed by your caregiver. It is important to keep all your appointments.  Limit alcohol intake to no more than 1 drink per day for nonpregnant women and 2 drinks per day for men. Drinking more than that is harmful to your heart. Tell your caregiver if you drink alcohol several times a week. Talk with your caregiver about whether alcohol is safe for you. If your heart has already been damaged by alcohol or you have severe heart failure, drinking alcohol should be stopped completely.  Stop illicit drug use.  Stay up-to-date with immunizations. It is especially important to prevent respiratory infections through current pneumococcal and influenza immunizations.  Manage other health conditions such as hypertension, diabetes, thyroid disease, or abnormal heart rhythms as directed by your caregiver.  Learn to manage stress.  Plan rest periods when fatigued.  Learn strategies to manage high temperatures. If the weather is extremely hot:  Avoid vigorous physical activity.  Use air conditioning or fans or seek a cooler location.  Avoid caffeine and alcohol.  Wear loose-fitting, lightweight, and light-colored clothing.  Learn strategies to manage cold temperatures. If the weather is extremely cold:  Avoid vigorous physical activity.  Layer clothes.  Wear mittens or gloves, a hat, and a scarf when going outside.  Avoid alcohol.  Obtain ongoing education and support as needed.  Participate or seek rehabilitation as needed to maintain or improve independence and quality of life. SEEK MEDICAL CARE IF:   Your weight increases by 03 lb/1.4 kg in 1 day or 05 lb/2.3 kg in a week.  You have increasing shortness of breath that is unusual for you.  You are unable to participate in your usual physical activities.  You tire easily.  You cough more than normal, especially with physical activity.  You have  any or more swelling in areas such as your hands, feet, ankles, or abdomen.  You are unable to sleep because it is hard to breathe.  You feel like your heart is beating fast (palpitations).  You become dizzy or lightheaded upon standing up. SEEK IMMEDIATE MEDICAL CARE IF:   You have difficulty breathing.  There is a change in mental status such as decreased alertness or difficulty with concentration.  You have a pain or discomfort in your chest.  You have an episode of fainting (syncope). MAKE SURE YOU:   Understand these instructions.  Will watch your condition.  Will get help right away if you are not doing well or get worse. Document Released: 09/04/2005 Document Revised: 12/30/2012 Document Reviewed: 09/26/2012 [facility] Patient Information 2014 [place], [place].

## 2013-10-12 NOTE — ED Notes (Signed)
Per EMS, pt husband called 911 because the pt had not slept in three days and he was concerned something was wrong.  Pt specifically requested transport to [facility].  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

## 2013-10-12 NOTE — ED Notes (Signed)
Pt's Mother Contact Number: [phone]

## 2013-10-12 NOTE — ED Provider Notes (Addendum)
CT's and UA neg.  Pt has some mild heart failure and vascular congestion however pt has been sleeping the entire time she has been here and has had normal oxygen saturations without resp issues.  Pt was instructed to increase lasix for the next 3 days.  She has not had any problems sleeping here and no signs of infection.  Dr. Judd Lien has spoken with pt's son earlier and given labs are not significant for acute findings will d/c home.  Gwyneth Sprout, MD 10/12/13 1450  Gwyneth Sprout, MD 10/12/13 [phone]

## 2013-10-12 NOTE — ED Notes (Signed)
Pt currently resting in room comfortably EDP at bedside.

## 2013-10-12 NOTE — ED Provider Notes (Signed)
CSN: [id number]     Arrival date & time 10/12/13  0431 History   First MD Initiated Contact with Patient 10/12/13 0444     Chief Complaint  Patient presents with  . Insomnia   (Consider location/radiation/quality/duration/timing/severity/associated sxs/prior Treatment) HPI Comments: Patient is a 58 year old female with extensive past medical history including seizures, CVA, hypertension, diabetes, atrial fibrillation. Per EMS, the patient is here because she has not slept in several days and her husband believes that something may be wrong. The patient has a difficult time staying awake to answer my questions, however she tells me she has been having chest discomfort for the past several days. She also reports cough, and generally not feeling well. She has history of stroke and I suspect this may have affected her speech somewhat as she has a hard time expressing how she is feeling.  The history is provided by the patient.    Past Medical History  Diagnosis Date  . Seizures   . Hypertension   . Stroke     2007 - R sided weakness and expressive aphasia with h/o behavioral problems related to this  . Asthma   . Diabetes mellitus   . Chronic systolic CHF (congestive heart failure)   . Atrial fibrillation     a. On Xarelto after having DVT (prev felt to be poor anticoag cand 2/2 noncompliance).  . History of DVT (deep vein thrombosis)     a. 06/2012: RLE DVT, + protein C deficiency, placed on Xarelto. s/p IVC filter  . Noncompliance     Due to mental status, family has had to coax her to take medicines  . Hypothyroidism   . VT (ventricular tachycardia)     a. h/o ICD ~2007-2008. b. Adm 08/2012 with UTI, had VT/ICD shock x 5 (hypokalemic);  c. 06/2013 s/p ICD Gen change: SJM DC ICD ser #: [id number]  . Protein C deficiency   . NICM (nonischemic cardiomyopathy)     a. s/p AICD ~2007 ([employer]) - previous care [place]. No known hx CAD. b. EF 10% by echo 04/2012;  c. 06/2013 s/p ICD Gen change:  SJM DC ICD ser #: [id number]  . 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 Cholecystectomy    . Appendectomy    . Pacemaker insertion    . Vena cava filter placement  07/19/2012    Procedure: INSERTION VENA-CAVA FILTER;  Surgeon: Larina Earthly, MD;  [facility];  Service: Vascular;  Laterality: [name];  . Insert / replace / remove pacemaker      ICD  . Abdominal hysterectomy     Family History  Problem Relation Age of Onset  . Hypertension Mother   . Coronary artery disease Neg Hx    History  Substance Use Topics  . Smoking status: Former Games developer  . Smokeless tobacco: Never Used  . Alcohol Use: No   OB History   Grav Para Term Preterm Abortions TAB SAB Ect Mult Living                 Review of Systems  Constitutional: Positive for fatigue. Negative for chills.  Cardiovascular: Positive for chest pain.  All other systems reviewed and are negative.    Allergies  Penicillins; Sulfa antibiotics; Penicillins; and Sulfa antibiotics  Home Medications   Current Outpatient Rx  Name  Route  Sig  Dispense  Refill  . acetaminophen (TYLENOL) 325 MG tablet  Oral   Take 650 mg by mouth every 6 (six) hours as needed for pain.         Marland Kitchen. albuterol (PROVENTIL) (2.5 MG/3ML) 0.083% nebulizer solution   Nebulization   Take 2.5 mg by nebulization every 6 (six) hours as needed. For cough or wheeze         . ALPRAZolam (XANAX) 0.5 MG tablet   Oral   Take 0.5 mg by mouth at bedtime as needed for sleep.         Marland Kitchen. amiodarone (PACERONE) 400 MG tablet   Oral   Take 0.5 tablets (200 mg total) by mouth See admin instructions. Takes 200mg  on Mon, Tues, Wed, Thurs and Fri's only (none on weekends)   90 tablet   1   . digoxin (LANOXIN) 0.125 MG tablet   Oral   Take 1 tablet (0.125 mg total) by mouth daily.   30 tablet   0   . escitalopram (LEXAPRO) 20 MG tablet   Oral   Take 1 tablet (20 mg total) by mouth daily.         .  furosemide (LASIX) 40 MG tablet   Oral   Take 40 mg by mouth 2 (two) times daily.         . insulin lispro (HUMALOG) 100 UNIT/ML injection   Subcutaneous   Inject into the skin 3 (three) times daily before meals. On sliding scale         . levETIRAcetam (KEPPRA) 1000 MG tablet   Oral   Take 2,000 mg by mouth 2 (two) times daily.         Marland Kitchen. levothyroxine (SYNTHROID, LEVOTHROID) 50 MCG tablet   Oral   Take 50 mcg by mouth daily before breakfast.         . lisinopril (PRINIVIL,ZESTRIL) 5 MG tablet   Oral   Take 5 mg by mouth daily.         . metolazone (ZAROXOLYN) 2.5 MG tablet   Oral   Take 1 tablet (2.5 mg total) by mouth as needed. For fluid retention   5 tablet   1   . metoprolol tartrate (LOPRESSOR) 25 MG tablet   Oral   Take 1 tablet (25 mg total) by mouth 2 (two) times daily.         Marland Kitchen. omeprazole (PRILOSEC) 20 MG capsule   Oral   Take 20 mg by mouth 2 (two) times daily.         Marland Kitchen. oxyCODONE-acetaminophen (PERCOCET) 10-325 MG per tablet   Oral   Take 1 tablet by mouth every 8 (eight) hours as needed for pain.         . polyethylene glycol (MIRALAX / GLYCOLAX) packet   Oral   Take 17 g by mouth daily as needed. For constipation         . potassium chloride 20 MEQ/15ML (10%) solution   Oral   Take 15 mLs (20 mEq total) by mouth daily.   500 mL   0   . QUEtiapine Fumarate (SEROQUEL XR) 150 MG 24 hr tablet   Oral   Take 1 tablet (150 mg total) by mouth at bedtime.   30 tablet   0   . Rivaroxaban (XARELTO) 20 MG TABS tablet   Oral   Take 20 mg by mouth daily.         Marland Kitchen. spironolactone (ALDACTONE) 25 MG tablet   Oral   Take 25 mg by mouth daily.         .Marland Kitchen  Vitamin D, Ergocalciferol, (DRISDOL) 50000 UNITS CAPS capsule   Oral   Take 50,000 Units by mouth every 30 (thirty) days.          BP 114/77  Pulse 75  Temp(Src) 97.8 F (36.6 C) (Oral)  Resp 23  SpO2 100% Physical Exam  Nursing note and vitals reviewed. Constitutional: She  is oriented to person, place, and time. She appears well-developed and well-nourished. No distress.  HENT:  Head: Normocephalic and atraumatic.  Mouth/Throat: Oropharynx is clear and moist.  Eyes: EOM are normal. Pupils are equal, round, and reactive to light.  Neck: Normal range of motion. Neck supple.  Cardiovascular: Normal rate, regular rhythm, normal heart sounds and intact distal pulses.   No murmur heard. Pulmonary/Chest: Effort normal and breath sounds normal. No respiratory distress. She has no wheezes.  Abdominal: Soft. Bowel sounds are normal. She exhibits no distension. There is no tenderness.  Musculoskeletal: Normal range of motion. She exhibits no edema.  Lymphadenopathy:    She has no cervical adenopathy.  Neurological: She is alert and oriented to person, place, and time.  Patient is somnolent but arousable. She moves all extremities. She has weakness of her right arm and leg that appears chronic.  Skin: Skin is warm and dry. She is not diaphoretic.    ED Course  Procedures (including critical care time) Labs Review Labs Reviewed  CBC WITH DIFFERENTIAL  COMPREHENSIVE METABOLIC PANEL  TROPONIN I   Imaging Review No results found.   Date: 10/12/2013  Rate: 66  Rhythm: normal sinus rhythm  QRS Axis: normal  Intervals: PR prolonged  ST/T Wave abnormalities: LVH with repolarization abnormality  Conduction Disutrbances:first-degree A-V block   Narrative Interpretation:   Old EKG Reviewed: unchanged    MDM  No diagnosis found. Patient is a 58 year old female with extensive medical history sent for evaluation of what was originally reported to me as insomnia. EMS crew states that a family member on scene said that she hasn't slept in several days and felt as though something wasn't quite right. The patient has history of stroke and added very little useful information. She appears quite somnolent but is arousable. She will begin to history my questions and drift  off to sleep. She appears in no acute distress and her vital signs are unremarkable. Workup reveals an unchanged EKG, no elevation of white count, unremarkable electrolytes, and troponin that is negative. There is evidence for vascular congestion for which she was given an additional dose of Lasix. A CT scan of her head was also obtained to to her increased somnolence history of stroke. This was unremarkable.  I spoke with the patient's son with whom she lives. He informed me the reason she was here was related to her stomach. He explained to me that she has been complaining of abdominal pain for the past 5 days. Her abdomen was then reexamined and revealed mild tenderness to palpation in all 4 quadrants. According to the son, she did have gallbladder surgery but knows of no other surgeries in the past. I have ordered a CT of the abdomen and pelvis which will be performed to rule out acute intra-abdominal pathology. At this point the patient's care will be signed out to the oncoming provider at shift change. Who will obtain the results of the CT scan and determine the final disposition.    Geoffery Lyons, MD 10/12/13 [phone]

## 2013-10-12 NOTE — ED Notes (Signed)
[place], CT tech delivered oral contrast to patient room at 0650. Unable to rouse patient to start drinking oral contrast- Notified Zach, RN that contrast was at bedside, and to scan it into [facility] when patient starts drinking oral contrast.

## 2013-10-13 ENCOUNTER — Telehealth: Payer: Self-pay | Admitting: *Deleted

## 2013-10-13 MED ORDER — AMIODARONE HCL 200 MG PO TABS: ORAL_TABLET | ORAL | Status: DC

## 2013-10-13 NOTE — Telephone Encounter (Signed)
Formulary change per Wyvonnia Dusky, Pharm D per insurance purposes

## 2013-10-14 ENCOUNTER — Encounter: Payer: Medicare Other | Admitting: *Deleted

## 2013-10-24 ENCOUNTER — Encounter: Payer: Self-pay | Admitting: *Deleted

## 2013-11-12 ENCOUNTER — Emergency Department (HOSPITAL_COMMUNITY): Payer: Medicare Other

## 2013-11-12 ENCOUNTER — Emergency Department (HOSPITAL_COMMUNITY)
Admission: EM | Admit: 2013-11-12 | Discharge: 2013-11-12 | Disposition: A | Discharge status: Home / Self Care | Payer: Medicare Other | Attending: Emergency Medicine | Admitting: Emergency Medicine

## 2013-11-12 ENCOUNTER — Encounter (HOSPITAL_COMMUNITY): Payer: Self-pay | Admitting: Emergency Medicine

## 2013-11-12 DIAGNOSIS — I4891 Unspecified atrial fibrillation: Secondary | ICD-10-CM | POA: Insufficient documentation

## 2013-11-12 DIAGNOSIS — I10 Essential (primary) hypertension: Secondary | ICD-10-CM | POA: Insufficient documentation

## 2013-11-12 DIAGNOSIS — R0789 Other chest pain: Secondary | ICD-10-CM

## 2013-11-12 DIAGNOSIS — Z91199 Patient's noncompliance with other medical treatment and regimen due to unspecified reason: Secondary | ICD-10-CM | POA: Insufficient documentation

## 2013-11-12 DIAGNOSIS — Z7901 Long term (current) use of anticoagulants: Secondary | ICD-10-CM | POA: Insufficient documentation

## 2013-11-12 DIAGNOSIS — Z87448 Personal history of other diseases of urinary system: Secondary | ICD-10-CM | POA: Insufficient documentation

## 2013-11-12 DIAGNOSIS — J811 Chronic pulmonary edema: Secondary | ICD-10-CM | POA: Insufficient documentation

## 2013-11-12 DIAGNOSIS — R109 Unspecified abdominal pain: Secondary | ICD-10-CM | POA: Insufficient documentation

## 2013-11-12 DIAGNOSIS — G8929 Other chronic pain: Secondary | ICD-10-CM | POA: Insufficient documentation

## 2013-11-12 DIAGNOSIS — I5022 Chronic systolic (congestive) heart failure: Secondary | ICD-10-CM | POA: Insufficient documentation

## 2013-11-12 DIAGNOSIS — Z9119 Patient's noncompliance with other medical treatment and regimen: Secondary | ICD-10-CM | POA: Insufficient documentation

## 2013-11-12 DIAGNOSIS — Z95810 Presence of automatic (implantable) cardiac defibrillator: Secondary | ICD-10-CM | POA: Insufficient documentation

## 2013-11-12 DIAGNOSIS — E119 Type 2 diabetes mellitus without complications: Secondary | ICD-10-CM | POA: Insufficient documentation

## 2013-11-12 DIAGNOSIS — R1110 Vomiting, unspecified: Secondary | ICD-10-CM | POA: Insufficient documentation

## 2013-11-12 DIAGNOSIS — E039 Hypothyroidism, unspecified: Secondary | ICD-10-CM | POA: Insufficient documentation

## 2013-11-12 DIAGNOSIS — R4701 Aphasia: Secondary | ICD-10-CM | POA: Insufficient documentation

## 2013-11-12 DIAGNOSIS — J45901 Unspecified asthma with (acute) exacerbation: Secondary | ICD-10-CM | POA: Insufficient documentation

## 2013-11-12 DIAGNOSIS — Z86718 Personal history of other venous thrombosis and embolism: Secondary | ICD-10-CM | POA: Insufficient documentation

## 2013-11-12 DIAGNOSIS — R197 Diarrhea, unspecified: Secondary | ICD-10-CM | POA: Insufficient documentation

## 2013-11-12 DIAGNOSIS — Z87891 Personal history of nicotine dependence: Secondary | ICD-10-CM | POA: Insufficient documentation

## 2013-11-12 DIAGNOSIS — Z794 Long term (current) use of insulin: Secondary | ICD-10-CM | POA: Insufficient documentation

## 2013-11-12 DIAGNOSIS — G40909 Epilepsy, unspecified, not intractable, without status epilepticus: Secondary | ICD-10-CM | POA: Insufficient documentation

## 2013-11-12 DIAGNOSIS — Z79899 Other long term (current) drug therapy: Secondary | ICD-10-CM | POA: Insufficient documentation

## 2013-11-12 DIAGNOSIS — R071 Chest pain on breathing: Secondary | ICD-10-CM | POA: Insufficient documentation

## 2013-11-12 DIAGNOSIS — Z8673 Personal history of transient ischemic attack (TIA), and cerebral infarction without residual deficits: Secondary | ICD-10-CM | POA: Insufficient documentation

## 2013-11-12 DIAGNOSIS — R51 Headache: Secondary | ICD-10-CM | POA: Insufficient documentation

## 2013-11-12 LAB — COMPREHENSIVE METABOLIC PANEL
ALT: 30 U/L (ref 0–35)
AST: 55 U/L — ABNORMAL HIGH (ref 0–37)
Albumin: 3.2 g/dL — ABNORMAL LOW (ref 3.5–5.2)
Alkaline Phosphatase: 95 U/L (ref 39–117)
BUN: 16 mg/dL (ref 6–23)
CALCIUM: 9.1 mg/dL (ref 8.4–10.5)
CO2: 27 meq/L (ref 19–32)
CREATININE: 0.92 mg/dL (ref 0.50–1.10)
Chloride: 102 mEq/L (ref 96–112)
GFR, EST AFRICAN AMERICAN: 80 mL/min — AB (ref >90)
GFR, EST NON AFRICAN AMERICAN: 69 mL/min — AB (ref >90)
GLUCOSE: 237 mg/dL — AB (ref 70–99)
Potassium: 3.3 mEq/L — ABNORMAL LOW (ref 3.7–5.3)
Sodium: 142 mEq/L (ref 137–147)
TOTAL PROTEIN: 8.5 g/dL — AB (ref 6.0–8.3)
Total Bilirubin: 0.9 mg/dL (ref 0.3–1.2)

## 2013-11-12 LAB — CBC WITH DIFFERENTIAL/PLATELET
Basophils Absolute: 0 10*3/uL (ref 0.0–0.1)
Basophils Relative: 0 % (ref 0–1)
EOS ABS: 0.1 10*3/uL (ref 0.0–0.7)
EOS PCT: 2 % (ref 0–5)
HEMATOCRIT: 30.7 % — AB (ref 36.0–46.0)
Hemoglobin: 8.8 g/dL — ABNORMAL LOW (ref 12.0–15.0)
LYMPHS PCT: 18 % (ref 12–46)
Lymphs Abs: 1.2 10*3/uL (ref 0.7–4.0)
MCH: 22.2 pg — AB (ref 26.0–34.0)
MCHC: 28.7 g/dL — ABNORMAL LOW (ref 30.0–36.0)
MCV: 77.3 fL — AB (ref 78.0–100.0)
MONO ABS: 0.6 10*3/uL (ref 0.1–1.0)
Monocytes Relative: 9 % (ref 3–12)
Neutro Abs: 4.6 10*3/uL (ref 1.7–7.7)
Neutrophils Relative %: 71 % (ref 43–77)
PLATELETS: 250 10*3/uL (ref 150–400)
RBC: 3.97 MIL/uL (ref 3.87–5.11)
RDW: 19.9 % — AB (ref 11.5–15.5)
WBC: 6.5 10*3/uL (ref 4.0–10.5)

## 2013-11-12 LAB — URINE MICROSCOPIC-ADD ON

## 2013-11-12 LAB — TROPONIN I

## 2013-11-12 LAB — URINALYSIS, ROUTINE W REFLEX MICROSCOPIC
Bilirubin Urine: NEGATIVE
Glucose, UA: NEGATIVE mg/dL
HGB URINE DIPSTICK: NEGATIVE
Ketones, ur: NEGATIVE mg/dL
LEUKOCYTES UA: NEGATIVE
NITRITE: NEGATIVE
Protein, ur: 30 mg/dL — AB
Specific Gravity, Urine: 1.018 (ref 1.005–1.030)
UROBILINOGEN UA: 1 mg/dL (ref 0.0–1.0)
pH: 6 (ref 5.0–8.0)

## 2013-11-12 LAB — LIPASE, BLOOD: LIPASE: 21 U/L (ref 11–59)

## 2013-11-12 LAB — PRO B NATRIURETIC PEPTIDE: Pro B Natriuretic peptide (BNP): 5947 pg/mL — ABNORMAL HIGH (ref 0–125)

## 2013-11-12 LAB — I-STAT CG4 LACTIC ACID, ED: Lactic Acid, Venous: 1.88 mmol/L (ref 0.5–2.2)

## 2013-11-12 MED ORDER — ONDANSETRON HCL 4 MG/2ML IJ SOLN: 4.0000 mg | Freq: Once | INTRAMUSCULAR | Status: DC

## 2013-11-12 MED ORDER — FUROSEMIDE 10 MG/ML IJ SOLN
40.0000 mg | Freq: Once | INTRAMUSCULAR | Status: AC
  Administered 2013-11-12: 40 mg via INTRAVENOUS
  Filled 2013-11-12: qty 4

## 2013-11-12 MED ORDER — HYDROMORPHONE HCL PF 1 MG/ML IJ SOLN
1.0000 mg | Freq: Once | INTRAMUSCULAR | Status: AC
  Administered 2013-11-12: 1 mg via INTRAVENOUS
  Filled 2013-11-12: qty 1

## 2013-11-12 MED ORDER — PROMETHAZINE HCL 25 MG/ML IJ SOLN
25.0000 mg | Freq: Once | INTRAMUSCULAR | Status: AC
  Administered 2013-11-12: 25 mg via INTRAVENOUS
  Filled 2013-11-12: qty 1

## 2013-11-12 MED ORDER — DIPHENHYDRAMINE HCL 50 MG/ML IJ SOLN
12.5000 mg | Freq: Once | INTRAMUSCULAR | Status: AC
  Administered 2013-11-12: 12.5 mg via INTRAVENOUS
  Filled 2013-11-12: qty 1

## 2013-11-12 MED ORDER — MORPHINE SULFATE 4 MG/ML IJ SOLN
4.0000 mg | Freq: Once | INTRAMUSCULAR | Status: AC
  Administered 2013-11-12: 4 mg via INTRAVENOUS
  Filled 2013-11-12: qty 1

## 2013-11-12 MED ORDER — MORPHINE SULFATE 2 MG/ML IJ SOLN
4.0000 mg | Freq: Once | INTRAMUSCULAR | Status: DC
  Filled 2013-11-12: qty 2

## 2013-11-12 MED ORDER — MORPHINE SULFATE 4 MG/ML IJ SOLN: 4.0000 mg | Freq: Once | INTRAMUSCULAR | Status: DC

## 2013-11-12 MED ORDER — LORAZEPAM 2 MG/ML IJ SOLN
1.0000 mg | Freq: Four times a day (QID) | INTRAMUSCULAR | Status: DC | PRN
  Administered 2013-11-12: 1 mg via INTRAVENOUS
  Filled 2013-11-12: qty 1

## 2013-11-12 NOTE — Discharge Instructions (Signed)
Abdominal Pain, Women °Abdominal (stomach, pelvic, or belly) pain can be caused by many things. It is important to tell your doctor: °· The location of the pain. °· Does it come and go or is it present all the time? °· Are there things that start the pain (eating certain foods, exercise)? °· Are there other symptoms associated with the pain (fever, nausea, vomiting, diarrhea)? °All of this is helpful to know when trying to find the cause of the pain. °CAUSES  °· Stomach: virus or bacteria infection, or ulcer. °· Intestine: appendicitis (inflamed appendix), regional ileitis (Crohn's disease), ulcerative colitis (inflamed colon), irritable bowel syndrome, diverticulitis (inflamed diverticulum of the colon), or cancer of the stomach or intestine. °· Gallbladder disease or stones in the gallbladder. °· Kidney disease, kidney stones, or infection. °· Pancreas infection or cancer. °· Fibromyalgia (pain disorder). °· Diseases of the female organs: °· Uterus: fibroid (non-cancerous) tumors or infection. °· Fallopian tubes: infection or tubal pregnancy. °· Ovary: cysts or tumors. °· Pelvic adhesions (scar tissue). °· Endometriosis (uterus lining tissue growing in the pelvis and on the pelvic organs). °· Pelvic congestion syndrome (female organs filling up with blood just before the menstrual period). °· Pain with the menstrual period. °· Pain with ovulation (producing an egg). °· Pain with an IUD (intrauterine device, birth control) in the uterus. °· Cancer of the female organs. °· Functional pain (pain not caused by a disease, may improve without treatment). °· Psychological pain. °· Depression. °DIAGNOSIS  °Your doctor will decide the seriousness of your pain by doing an examination. °· Blood tests. °· X-rays. °· Ultrasound. °· CT scan (computed tomography, special type of X-ray). °· MRI (magnetic resonance imaging). °· Cultures, for infection. °· Barium enema (dye inserted in the large intestine, to better view it with  X-rays). °· Colonoscopy (looking in intestine with a lighted tube). °· Laparoscopy (minor surgery, looking in abdomen with a lighted tube). °· Major abdominal exploratory surgery (looking in abdomen with a large incision). °TREATMENT  °The treatment will depend on the cause of the pain.  °· Many cases can be observed and treated at home. °· Over-the-counter medicines recommended by your caregiver. °· Prescription medicine. °· Antibiotics, for infection. °· Birth control pills, for painful periods or for ovulation pain. °· Hormone treatment, for endometriosis. °· Nerve blocking injections. °· Physical therapy. °· Antidepressants. °· Counseling with a psychologist or psychiatrist. °· Minor or major surgery. °HOME CARE INSTRUCTIONS  °· Do not take laxatives, unless directed by your caregiver. °· Take over-the-counter pain medicine only if ordered by your caregiver. Do not take aspirin because it can cause an upset stomach or bleeding. °· Try a clear liquid diet (broth or water) as ordered by your caregiver. Slowly move to a bland diet, as tolerated, if the pain is related to the stomach or intestine. °· Have a thermometer and take your temperature several times a day, and record it. °· Bed rest and sleep, if it helps the pain. °· Avoid sexual intercourse, if it causes pain. °· Avoid stressful situations. °· Keep your follow-up appointments and tests, as your caregiver orders. °· If the pain does not go away with medicine or surgery, you may try: °· Acupuncture. °· Relaxation exercises (yoga, meditation). °· Group therapy. °· Counseling. °SEEK MEDICAL CARE IF:  °· You notice certain foods cause stomach pain. °· Your home care treatment is not helping your pain. °· You need stronger pain medicine. °· You want your IUD removed. °· You feel faint or   lightheaded.  You develop nausea and vomiting.  You develop a rash.  You are having side effects or an allergy to your medicine. SEEK IMMEDIATE MEDICAL CARE IF:   Your  pain does not go away or gets worse.  You have a fever.  Your pain is felt only in portions of the abdomen. The right side could possibly be appendicitis. The left lower portion of the abdomen could be colitis or diverticulitis.  You are passing blood in your stools (bright red or black tarry stools, with or without vomiting).  You have blood in your urine.  You develop chills, with or without a fever.  You pass out. MAKE SURE YOU:   Understand these instructions.  Will watch your condition.  Will get help right away if you are not doing well or get worse. Document Released: 07/02/2007 Document Revised: 11/27/2011 Document Reviewed: 07/22/2009 [facility] Patient Information 2014 [place], [place].  Chest Wall Pain Chest wall pain is pain in or around the bones and muscles of your chest. It may take up to 6 weeks to get better. It may take longer if you must stay physically active in your work and activities.  CAUSES  Chest wall pain may happen on its own. However, it may be caused by:  A viral illness like the flu.  Injury.  Coughing.  Exercise.  Arthritis.  Fibromyalgia.  Shingles. HOME CARE INSTRUCTIONS   Avoid overtiring physical activity. Try not to strain or perform activities that cause pain. This includes any activities using your chest or your abdominal and side muscles, especially if heavy weights are used.  Put ice on the sore area.  Put ice in a plastic bag.  Place a towel between your skin and the bag.  Leave the ice on for 15-20 minutes per hour while awake for the first 2 days.  Only take over-the-counter or prescription medicines for pain, discomfort, or fever as directed by your caregiver. SEEK IMMEDIATE MEDICAL CARE IF:   Your pain increases, or you are very uncomfortable.  You have a fever.  Your chest pain becomes worse.  You have new, unexplained symptoms.  You have nausea or vomiting.  You feel sweaty or lightheaded.  You have a  cough with phlegm (sputum), or you cough up blood. MAKE SURE YOU:   Understand these instructions.  Will watch your condition.  Will get help right away if you are not doing well or get worse. Document Released: 09/04/2005 Document Revised: 11/27/2011 Document Reviewed: 05/01/2011 [facility] Patient Information 2014 [place], [place].  Migraine Headache A migraine headache is an intense, throbbing pain on one or both sides of your head. A migraine can last for 30 minutes to several hours. CAUSES  The exact cause of a migraine headache is not always known. However, a migraine may be caused when nerves in the brain become irritated and release chemicals that cause inflammation. This causes pain. Certain things may also trigger migraines, such as:  Alcohol.  Smoking.  Stress.  Menstruation.  Aged cheeses.  Foods or drinks that contain nitrates, glutamate, aspartame, or tyramine.  Lack of sleep.  Chocolate.  Caffeine.  Hunger.  Physical exertion.  Fatigue.  Medicines used to treat chest pain (nitroglycerine), birth control pills, estrogen, and some blood pressure medicines. SIGNS AND SYMPTOMS  Pain on one or both sides of your head.  Pulsating or throbbing pain.  Severe pain that prevents daily activities.  Pain that is aggravated by any physical activity.  Nausea, vomiting, or both.  Dizziness.  Pain with exposure to bright lights, loud noises, or activity.  General sensitivity to bright lights, loud noises, or smells. Before you get a migraine, you may get warning signs that a migraine is coming (aura). An aura may include:  Seeing flashing lights.  Seeing bright spots, halos, or zig-zag lines.  Having tunnel vision or blurred vision.  Having feelings of numbness or tingling.  Having trouble talking.  Having muscle weakness. DIAGNOSIS  A migraine headache is often diagnosed based on:  Symptoms.  Physical exam.  A CT scan or MRI of your head.  These imaging tests cannot diagnose migraines, but they can help rule out other causes of headaches. TREATMENT Medicines may be given for pain and nausea. Medicines can also be given to help prevent recurrent migraines.  HOME CARE INSTRUCTIONS  Only take over-the-counter or prescription medicines for pain or discomfort as directed by your health care provider. The use of long-term narcotics is not recommended.  Lie down in a dark, quiet room when you have a migraine.  Keep a journal to find out what may trigger your migraine headaches. For example, write down:  What you eat and drink.  How much sleep you get.  Any change to your diet or medicines.  Limit alcohol consumption.  Quit smoking if you smoke.  Get 7 9 hours of sleep, or as recommended by your health care provider.  Limit stress.  Keep lights dim if bright lights bother you and make your migraines worse. SEEK IMMEDIATE MEDICAL CARE IF:   Your migraine becomes severe.  You have a fever.  You have a stiff neck.  You have vision loss.  You have muscular weakness or loss of muscle control.  You start losing your balance or have trouble walking.  You feel faint or pass out.  You have severe symptoms that are different from your first symptoms. MAKE SURE YOU:   Understand these instructions.  Will watch your condition.  Will get help right away if you are not doing well or get worse. Document Released: 09/04/2005 Document Revised: 06/25/2013 Document Reviewed: 05/12/2013 [facility] Patient Information 2014 [place], [place]. He

## 2013-11-12 NOTE — ED Provider Notes (Signed)
TIME SEEN: 8:36 AM  CHIEF COMPLAINT: Headache, and left chest wall pain, abdominal pain, shortness of breath  HPI: Patient is a 58 year old female with a history of prior left MCA infarct right-sided hemiparesis and expressive aphasia, hypertension, systolic CHF with ejection fraction of 15-20% status post pacemaker/AICD, atrial fibrillation and prior DVT on Xarelto, hypothyroidism who presents to the emergency department with multiple complaints. History is limited as patient has expressive aphasia. When asked where she is hurting she points to the left side of her head, her left chest and her left abdomen. She nods yes when she is asked if she has had this pain before.  She denies sudden onset, thunderclap, worst HA of her life.  No new neuro deficits.  Per EMS, patient and her family report she has had vomiting and diarrhea. Patient does state she feels short of breath but is difficult to obtain it this is secondary to pain as she is appears uncomfortable and is hyperventilating.  Patient points to her defibrillator as were the source of her pain is in her chest wall is tender to palpation. There is no new redness, swelling, warmth, induration or fluctuance. She denies that her difficulty has fired recently. She's been seen in the emergency department several times for chest wall pain. She denies that this is any different, states this is chronic. She was seen in the emergency room one month ago for similar abdominal pain and at that time had a negative CT of her abdomen and pelvis. She denies any fevers or chills.  She nods yes to having dysuria and no to hematuria.  ROS: See HPI Constitutional: no fever  Eyes: no drainage  ENT: no runny nose   Cardiovascular:   chest pain  Resp:  SOB GI: vomiting GU: no dysuria Integumentary: no rash  Allergy: no hives  Musculoskeletal: no leg swelling  Neurological: no slurred speech ROS otherwise negative  PAST MEDICAL HISTORY/PAST SURGICAL HISTORY:  Past  Medical History  Diagnosis Date  . Seizures   . Hypertension   . Stroke     2007 - R sided weakness and expressive aphasia with h/o behavioral problems related to this  . Asthma   . Diabetes mellitus   . Chronic systolic CHF (congestive heart failure)   . Atrial fibrillation     a. On Xarelto after having DVT (prev felt to be poor anticoag cand 2/2 noncompliance).  . History of DVT (deep vein thrombosis)     a. 06/2012: RLE DVT, + protein C deficiency, placed on Xarelto. s/p IVC filter  . Noncompliance     Due to mental status, family has had to coax her to take medicines  . Hypothyroidism   . VT (ventricular tachycardia)     a. h/o ICD ~2007-2008. b. Adm 08/2012 with UTI, had VT/ICD shock x 5 (hypokalemic);  c. 06/2013 s/p ICD Gen change: SJM DC ICD ser #: [id number]  . Protein C deficiency   . NICM (nonischemic cardiomyopathy)     a. s/p AICD ~2007 ([employer]) [place]. No known hx CAD. b. EF 10% by echo 04/2012;  c. 06/2013 s/p ICD Gen change: SJM DC ICD ser #: [id number]  . 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 Authorizing Provider  acetaminophen (TYLENOL) 325 MG tablet Take 650 mg by mouth every 6 (six) hours as needed for pain.  Historical Provider, MD  albuterol (PROVENTIL) (2.5 MG/3ML) 0.083% nebulizer solution Take 2.5 mg by nebulization every 6 (six) hours as needed. For cough or wheeze    Historical Provider, MD  ALPRAZolam Prudy Feeler) 0.5 MG tablet Take 0.5 mg by mouth at bedtime as needed for sleep.    Historical Provider, MD  amiodarone (PACERONE) 200 MG tablet Take 2 tablets daily 10/13/13   Duke Salvia, MD  digoxin (LANOXIN) 0.125 MG tablet Take 1 tablet (0.125 mg total) by mouth daily. 09/01/13   Marinda Elk, MD  escitalopram (LEXAPRO) 20 MG tablet Take 1 tablet (20 mg total) by mouth daily. 09/01/13   Marinda Elk, MD  furosemide (LASIX) 40 MG  tablet Take 40 mg by mouth 2 (two) times daily.    Historical Provider, MD  insulin lispro (HUMALOG) 100 UNIT/ML injection Inject into the skin 3 (three) times daily before meals. On sliding scale    Historical Provider, MD  levETIRAcetam (KEPPRA) 1000 MG tablet Take 2,000 mg by mouth 2 (two) times daily.    Historical Provider, MD  levothyroxine (SYNTHROID, LEVOTHROID) 50 MCG tablet Take 50 mcg by mouth daily before breakfast.    Historical Provider, MD  lisinopril (PRINIVIL,ZESTRIL) 5 MG tablet Take 5 mg by mouth daily.    Historical Provider, MD  metolazone (ZAROXOLYN) 2.5 MG tablet Take 1 tablet (2.5 mg total) by mouth as needed. For fluid retention 08/29/13   Dolores Patty, MD  metoprolol tartrate (LOPRESSOR) 25 MG tablet Take 1 tablet (25 mg total) by mouth 2 (two) times daily. 09/01/13   Marinda Elk, MD  omeprazole (PRILOSEC) 20 MG capsule Take 20 mg by mouth 2 (two) times daily.    Historical Provider, MD  oxyCODONE-acetaminophen (PERCOCET) 10-325 MG per tablet Take 1 tablet by mouth every 8 (eight) hours as needed for pain.    Historical Provider, MD  polyethylene glycol (MIRALAX / GLYCOLAX) packet Take 17 g by mouth daily as needed. For constipation    Historical Provider, MD  potassium chloride 20 MEQ/15ML (10%) solution Take 15 mLs (20 mEq total) by mouth daily. 09/01/13   Marinda Elk, MD  QUEtiapine Fumarate (SEROQUEL XR) 150 MG 24 hr tablet Take 1 tablet (150 mg total) by mouth at bedtime. 09/01/13   Marinda Elk, MD  Rivaroxaban (XARELTO) 20 MG TABS tablet Take 20 mg by mouth daily.    Historical Provider, MD  spironolactone (ALDACTONE) 25 MG tablet Take 25 mg by mouth daily.    Historical Provider, MD  Vitamin D, Ergocalciferol, (DRISDOL) 50000 UNITS CAPS capsule Take 50,000 Units by mouth every 30 (thirty) days.    Historical Provider, MD    ALLERGIES:  Allergies  Allergen Reactions  . Penicillins Rash  . Sulfa Antibiotics Rash    SOCIAL HISTORY:   History  Substance Use Topics  . Smoking status: Former Games developer  . Smokeless tobacco: Never Used  . Alcohol Use: No    FAMILY HISTORY: Family History  Problem Relation Age of Onset  . Hypertension Mother   . Coronary artery disease Neg Hx     EXAM: BP 138/78  Pulse 86  Temp(Src) 98 F (36.7 C) (Oral)  Resp 29  SpO2 96% CONSTITUTIONAL: Alert and oriented and responds appropriately to questions. Well-appearing; well-nourished; appears uncomfortable HEAD: Normocephalic EYES: Conjunctivae clear, PERRL ENT: normal nose; no rhinorrhea; moist mucous membranes; pharynx without lesions noted NECK: Supple, no meningismus, no LAD  CARD: RRR; S1 and S2 appreciated; no murmurs, no clicks,  no rubs, no gallops RESP: Normal chest excursion without splinting; patient is tachypneic but this may be secondary to pain, no hypoxia, no increased work of breathing, breath sounds clear and equal bilaterally; no wheezes, no rhonchi, no rales, patient is tender to palpation over her pacemaker site with no fluctuance or induration or erythema or warmth ABD/GI: Normal bowel sounds; non-distended; soft, diffusely tender to palpation across her abdomen, worse in the left upper quadrant without rebound or guarding or peritoneal signs BACK:  The back appears normal and is non-tender to palpation, there is no CVA tenderness EXT: Normal ROM in all joints; non-tender to palpation; no edema; normal capillary refill; no cyanosis    SKIN: Normal color for age and race; warm NEURO: Patient has right-sided hemiparesis and expressive aphasia at baseline, otherwise no new cranial nerve deficits or weakness in her left upper arm lower extremity, sensation to light touch intact in her left side PSYCH: The patient's mood and manner are appropriate. Grooming and personal hygiene are appropriate.  MEDICAL DECISION MAKING: Patient here with complaints of headache, chest pain and abdominal pain but all appear to be chronic. Given  her history of expressive aphasia, it is difficult to obtain a history from the patient. Given her multiple significant medical problems, will obtain labs, urine, abdominal series. We'll give IV morphine and Zofran and reassess.  ED PROGRESS: The patient is now more agitated after receiving Phenergan. We'll give Benadryl and monitor. Likely dystonic reaction. She is also pointing to her chest as the site of her pain. We're unable to help she feels that she is receiving defibrillator shocks. Will interrogate her pacemaker.  Repeat EKG is unchanged.  11:02 AM  Pt's [employer] pacemaker interrogated. There is been no defibrillation, no arrhythmia. Patient is reportedly well known to the [facility]. Jude's interrogation team. They report she goes to multiple hospitals with similar complaints of left chest pain over her defibrillator site. Have also discussed patient's case with her son he states today she was complaining of feeling short of breath and he felt she was having a panic attack and began hyperventilating. Here she has no increased work of breathing or respiratory distress. She initially was tachypneic but when resting she is breathing normally.  She reports she is chronically on oxygen at home. Her oxygen saturation is 98% on 2 L. On my reevaluation, patient is currently playing a game on her cell phone and appears very comfortable. Her chest x-ray did show cardiomegaly with interstitial edema.  We'll get a dose of IV Lasix and instruct her to increase her Lasix for the next 3 days. Her abdominal x-ray shows possible ileus versus low-grade neuritis but nothing to suggest bowel obstruction. When distracted, patient's abdominal exam is completely benign. She reports that her abdominal pain is the same pain that she had when she was here in January 2015 and had a negative CT scan.   2:03 PM  Pt has been resting comfortably. She is hemodynamically stable. CT head unremarkable. She has no vomiting or diarrhea in the  ED. Repeat troponin is negative. Urine shows no sign of infection. The patient is safe to be discharged now as I feel most of her symptoms are chronic in nature and there is no life-threatening illness. She does have mild pulmonary edema for which I'll have her increase her Lasix for the next 3 days and followup with her primary doctor that she has no hypoxia or increased work of breathing or respiratory distress and is satting  well on her normal 2 L of oxygen which she wears at home. We'll discharge with return precautions. I do not feel the patient needs to be discharged with narcotics at this time as I feel her symptoms are chronic and she needs to followup with her primary doctor   EKG Interpretation    Date/Time:  Wednesday November 12 2013 08:32:23 EST Ventricular Rate:  87 PR Interval:  214 QRS Duration: 126 QT Interval:  427 QTC Calculation: 514 R Axis:   90 Text Interpretation:  Sinus rhythm Atrial premature complex Prolonged PR interval Left atrial enlargement LVH with secondary repolarization abnormality Prolonged QT interval No significant change since last tracing Confirmed by Nera Haworth  DO, Aiyannah Fayad ([id number]) on 11/12/2013 8:44:15 AM             EKG Interpretation    Date/Time:  Wednesday November 12 2013 09:32:24 EST Ventricular Rate:  101 PR Interval:  208 QRS Duration: 129 QT Interval:  406 QTC Calculation: 526 R Axis:   53 Text Interpretation:  Sinus tachycardia Paired ventricular premature complexes Prolonged PR interval Probable left atrial enlargement Left bundle branch block No significant change since last tracing Confirmed by Lukisha Procida  DO, Anniebelle Devore ([id number]) on 11/12/2013 10:01:15 AM             Layla Maw Aaliyah Cancro, DO 11/12/13 1405

## 2013-11-12 NOTE — ED Notes (Signed)
Patient transported to CT 

## 2013-11-12 NOTE — ED Notes (Signed)
Pt from home, c/o left abd pain and sob. Per EMS pt has hx of expressive aphasia and right sided deficits from previous stroke. Pt cbg was 348 on scene. Pt tacypnic, rr rate at 31

## 2013-11-24 ENCOUNTER — Telehealth: Payer: Self-pay | Admitting: *Deleted

## 2013-11-24 MED ORDER — AMIODARONE HCL 200 MG PO TABS: ORAL_TABLET | ORAL | Status: DC

## 2013-11-24 NOTE — Telephone Encounter (Signed)
Clarification of amiodarone dose, takes 20 mg 5 days week, none on weekend

## 2013-11-24 NOTE — Telephone Encounter (Signed)
PA to [employer] for [employer]

## 2013-11-26 NOTE — Telephone Encounter (Signed)
PA not needed for amiodarone per [facility]

## 2013-12-17 ENCOUNTER — Ambulatory Visit (HOSPITAL_COMMUNITY)
Admission: RE | Admit: 2013-12-17 | Discharge: 2013-12-17 | Disposition: A | Discharge status: Home / Self Care | Payer: Medicare Other | Source: Ambulatory Visit | Attending: Internal Medicine | Admitting: Internal Medicine

## 2013-12-17 ENCOUNTER — Encounter (HOSPITAL_COMMUNITY): Payer: Self-pay

## 2013-12-17 VITALS — BP 128/70 | HR 85 | Wt 113.8 lb

## 2013-12-17 DIAGNOSIS — I4891 Unspecified atrial fibrillation: Secondary | ICD-10-CM | POA: Insufficient documentation

## 2013-12-17 DIAGNOSIS — I5022 Chronic systolic (congestive) heart failure: Secondary | ICD-10-CM

## 2013-12-17 DIAGNOSIS — E119 Type 2 diabetes mellitus without complications: Secondary | ICD-10-CM

## 2013-12-17 DIAGNOSIS — I509 Heart failure, unspecified: Secondary | ICD-10-CM

## 2013-12-17 MED ORDER — LISINOPRIL 5 MG PO TABS: 5.0000 mg | ORAL_TABLET | Freq: Every day | ORAL | Status: DC

## 2013-12-17 NOTE — Progress Notes (Signed)
Patient ID: Elizabeth Love, female   DOB: May 01, 1956, 58 y.o.   MRN: [id number]    Weight Range 108-110  Baseline proBNP   EP : Dr Graciela Husbands   HPI: Mrs Elizabeth Love is 39 year old with a very complicated PMH of chronic systolic heart failure due to NICM (EF 10% 04/2012), status post ICD implantation (placed [facility] 2008 was followed by MD in [place]), hypothyroidism, seizure disorder, HTN, DM2. 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 A fib (on Xarelto), RLE DVT, S/P IVC filter, and protein C deficiency.   Admitted to [facility] 01/06/13 with volume overload in setting of medication noncompliance.  ECHO EF 10%. She was started on IV lasix. While admitted she frequently refused to talk and refused medications and treatments.  Discharge weight 118 pounds.   She returns for follow up with her borhter. . Denies SOB/PND/Orthopnea. Weight at home 113 pounds.  She has a nurse aide 5 hours a day 7 days a week.  She has been out of lisinopril for a week. Tries to follow low salt diet but has lots of snacks.    Labs 11/12/13 K 3.3 Creatinine 0.92   ROS: All systems negative except as listed in HPI, PMH and Problem List.  Past Medical History  Diagnosis Date  . Seizures   . Hypertension   . Stroke     2007 - R sided weakness and expressive aphasia with h/o behavioral problems related to this  . Asthma   . Diabetes mellitus   . Chronic systolic CHF (congestive heart failure)   . Atrial fibrillation     a. On Xarelto after having DVT (prev felt to be poor anticoag cand 2/2 noncompliance).  . History of DVT (deep vein thrombosis)     a. 06/2012: RLE DVT, + protein C deficiency, placed on Xarelto. s/p IVC filter  . Noncompliance     Due to mental status, family has had to coax her to take medicines  . Hypothyroidism   . VT (ventricular tachycardia)     a. h/o ICD ~2007-2008. b. Adm 08/2012 with UTI, had VT/ICD shock x 5 (hypokalemic);  c. 06/2013 s/p ICD Gen change: SJM DC ICD ser #: [id number]  .  Protein C deficiency   . NICM (nonischemic cardiomyopathy)     a. s/p AICD ~2007 ([employer]) - previous care [place]. No known hx CAD. b. EF 10% by echo 04/2012;  c. 06/2013 s/p ICD Gen change: SJM DC ICD ser #: [id number]  . Renal disorder     ?infection per brother    Current Outpatient Prescriptions  Medication Sig Dispense Refill  . acetaminophen (TYLENOL) 325 MG tablet Take 650 mg by mouth every 6 (six) hours as needed for pain.      Marland Kitchen albuterol (PROVENTIL) (2.5 MG/3ML) 0.083% nebulizer solution Take 2.5 mg by nebulization every 6 (six) hours as needed. For cough or wheeze      . ALPRAZolam (XANAX) 0.5 MG tablet Take 0.5 mg by mouth at bedtime as needed for sleep.      Marland Kitchen amiodarone (PACERONE) 200 MG tablet Take 1 tablet daily except none on weekend confirmed with family member 11/24/2013  30 tablet  6  . digoxin (LANOXIN) 0.125 MG tablet Take 1 tablet (0.125 mg total) by mouth daily.  30 tablet  0  . escitalopram (LEXAPRO) 20 MG tablet Take 1 tablet (20 mg total) by mouth daily.      . furosemide (LASIX) 40 MG tablet Take 40 mg  by mouth 2 (two) times daily.      . insulin lispro (HUMALOG) 100 UNIT/ML injection Inject into the skin 3 (three) times daily before meals. On sliding scale      . levETIRAcetam (KEPPRA) 1000 MG tablet Take 2,000 mg by mouth 2 (two) times daily.      Marland Kitchen levothyroxine (SYNTHROID, LEVOTHROID) 50 MCG tablet Take 50 mcg by mouth daily before breakfast.      . metolazone (ZAROXOLYN) 2.5 MG tablet Take 1 tablet (2.5 mg total) by mouth as needed. For fluid retention  5 tablet  1  . metoprolol tartrate (LOPRESSOR) 25 MG tablet Take 1 tablet (25 mg total) by mouth 2 (two) times daily.      Marland Kitchen omeprazole (PRILOSEC) 20 MG capsule Take 20 mg by mouth 2 (two) times daily.      Marland Kitchen oxyCODONE-acetaminophen (PERCOCET) 10-325 MG per tablet Take 1 tablet by mouth every 8 (eight) hours as needed for pain.      . polyethylene glycol (MIRALAX / GLYCOLAX) packet Take 17 g by mouth daily as  needed. For constipation      . QUEtiapine Fumarate (SEROQUEL XR) 150 MG 24 hr tablet Take 1 tablet (150 mg total) by mouth at bedtime.  30 tablet  0  . Rivaroxaban (XARELTO) 20 MG TABS tablet Take 20 mg by mouth daily.      Marland Kitchen spironolactone (ALDACTONE) 25 MG tablet Take 25 mg by mouth daily.      . Vitamin D, Ergocalciferol, (DRISDOL) 50000 UNITS CAPS capsule Take 50,000 Units by mouth every 30 (thirty) days.      Marland Kitchen lisinopril (PRINIVIL,ZESTRIL) 5 MG tablet Take 5 mg by mouth daily.      . potassium chloride 20 MEQ/15ML (10%) solution Take 15 mLs (20 mEq total) by mouth daily.  500 mL  0   No current facility-administered medications for this encounter.     PHYSICAL EXAM: Filed Vitals:   12/17/13 1336  BP: 128/70  Pulse: 85  Weight: 113 lb 12.8 oz (51.619 kg)  SpO2: 100%    General: Cooperative. Some expressive aphasia. No resp difficulty Sitting in wheel chair. Brother present HEENT: normal Neck: supple. JVP 6-7. Carotids 2+ bilaterally; no bruits. No lymphadenopathy or thryomegaly appreciated. Cor: PMI normal. Regular rate & rhythm. No rubs, gallops or murmurs. L upper chest scar ICD Lungs: clear Abdomen: soft, nontender, nondistended. No hepatosplenomegaly. No bruits or masses. Good bowel sounds. Extremities: no cyanosis, clubbing, rash,  Edema R sided hemiparesis Neuro: alert interactive. R sided weakness. + expressive aphasia  1. Chronic Systolic Heart Failure . NYHA II-III difficult to assess to phycological challenges ECHO EF 10% 12/2012. S/P [employer] ICD generator change 2014.  - Volume status stable. Continue lasix 40 mg twice daily and spironolactone 25 mg daily with metolazone as needed  I encouraged her to take potassium.  - Continue Toprol XL  25 mg twice a day  and digoxin 0.125 mg daily - Restart lisinopril 10 mg daily she has been out of for a week.  -Reinforced daily weights and low salt food choices. I have asked her brother to encourage medication compliance. She  declines lab draw.  2. A fib-Rate controlled . Continue amiodarone 200 mg daily. Continue Xarelto 20 mg daily. Needs LFTs but she declines lab work.  3. CVA R sided hemiparesis - has follow up with neurologist April 15.  4. S/P [employer] Jude ICD- refer to EP for follow up regarding pain at ICD site.  Follow up in 3  months  CLEGG,AMY NP-C 1:51 PM

## 2013-12-17 NOTE — Patient Instructions (Signed)
Follow up in 3 months   Do the following things EVERYDAY: 1) Weigh yourself in the morning before breakfast. Write it down and keep it in a log. 2) Take your medicines as prescribed 3) Eat low salt foods-Limit salt (sodium) to 2000 mg per day.  4) Stay as active as you can everyday 5) Limit all fluids for the day to less than 2 liters  

## 2013-12-25 ENCOUNTER — Other Ambulatory Visit (HOSPITAL_COMMUNITY): Payer: Self-pay | Admitting: Internal Medicine

## 2013-12-26 ENCOUNTER — Encounter: Payer: Self-pay | Admitting: Cardiology

## 2014-01-10 ENCOUNTER — Emergency Department (HOSPITAL_COMMUNITY)
Admission: EM | Admit: 2014-01-10 | Discharge: 2014-01-10 | Disposition: A | Discharge status: Home / Self Care | Payer: Medicare Other | Attending: Emergency Medicine | Admitting: Emergency Medicine

## 2014-01-10 ENCOUNTER — Emergency Department (HOSPITAL_COMMUNITY): Payer: Medicare Other

## 2014-01-10 ENCOUNTER — Encounter (HOSPITAL_COMMUNITY): Payer: Self-pay | Admitting: Emergency Medicine

## 2014-01-10 DIAGNOSIS — Z880 Allergy status to penicillin: Secondary | ICD-10-CM | POA: Insufficient documentation

## 2014-01-10 DIAGNOSIS — Z91199 Patient's noncompliance with other medical treatment and regimen due to unspecified reason: Secondary | ICD-10-CM | POA: Insufficient documentation

## 2014-01-10 DIAGNOSIS — Z9119 Patient's noncompliance with other medical treatment and regimen: Secondary | ICD-10-CM | POA: Insufficient documentation

## 2014-01-10 DIAGNOSIS — Z8673 Personal history of transient ischemic attack (TIA), and cerebral infarction without residual deficits: Secondary | ICD-10-CM | POA: Insufficient documentation

## 2014-01-10 DIAGNOSIS — Z87448 Personal history of other diseases of urinary system: Secondary | ICD-10-CM | POA: Insufficient documentation

## 2014-01-10 DIAGNOSIS — R51 Headache: Secondary | ICD-10-CM | POA: Insufficient documentation

## 2014-01-10 DIAGNOSIS — I10 Essential (primary) hypertension: Secondary | ICD-10-CM | POA: Insufficient documentation

## 2014-01-10 DIAGNOSIS — Z87891 Personal history of nicotine dependence: Secondary | ICD-10-CM | POA: Insufficient documentation

## 2014-01-10 DIAGNOSIS — I4891 Unspecified atrial fibrillation: Secondary | ICD-10-CM | POA: Insufficient documentation

## 2014-01-10 DIAGNOSIS — J45901 Unspecified asthma with (acute) exacerbation: Secondary | ICD-10-CM | POA: Insufficient documentation

## 2014-01-10 DIAGNOSIS — E119 Type 2 diabetes mellitus without complications: Secondary | ICD-10-CM | POA: Insufficient documentation

## 2014-01-10 DIAGNOSIS — Z79899 Other long term (current) drug therapy: Secondary | ICD-10-CM | POA: Insufficient documentation

## 2014-01-10 DIAGNOSIS — G40909 Epilepsy, unspecified, not intractable, without status epilepticus: Secondary | ICD-10-CM | POA: Insufficient documentation

## 2014-01-10 DIAGNOSIS — Z794 Long term (current) use of insulin: Secondary | ICD-10-CM | POA: Insufficient documentation

## 2014-01-10 DIAGNOSIS — Z86718 Personal history of other venous thrombosis and embolism: Secondary | ICD-10-CM | POA: Insufficient documentation

## 2014-01-10 DIAGNOSIS — E039 Hypothyroidism, unspecified: Secondary | ICD-10-CM | POA: Insufficient documentation

## 2014-01-10 DIAGNOSIS — R079 Chest pain, unspecified: Secondary | ICD-10-CM | POA: Insufficient documentation

## 2014-01-10 DIAGNOSIS — F411 Generalized anxiety disorder: Secondary | ICD-10-CM | POA: Insufficient documentation

## 2014-01-10 DIAGNOSIS — Z950 Presence of cardiac pacemaker: Secondary | ICD-10-CM | POA: Insufficient documentation

## 2014-01-10 DIAGNOSIS — F419 Anxiety disorder, unspecified: Secondary | ICD-10-CM

## 2014-01-10 DIAGNOSIS — I5022 Chronic systolic (congestive) heart failure: Secondary | ICD-10-CM | POA: Insufficient documentation

## 2014-01-10 LAB — CBC WITH DIFFERENTIAL/PLATELET
BASOS PCT: 0 % (ref 0–1)
Basophils Absolute: 0 10*3/uL (ref 0.0–0.1)
EOS ABS: 0 10*3/uL (ref 0.0–0.7)
Eosinophils Relative: 0 % (ref 0–5)
HCT: 30.5 % — ABNORMAL LOW (ref 36.0–46.0)
Hemoglobin: 8.7 g/dL — ABNORMAL LOW (ref 12.0–15.0)
Lymphocytes Relative: 15 % (ref 12–46)
Lymphs Abs: 1 10*3/uL (ref 0.7–4.0)
MCH: 21.2 pg — AB (ref 26.0–34.0)
MCHC: 28.5 g/dL — AB (ref 30.0–36.0)
MCV: 74.4 fL — ABNORMAL LOW (ref 78.0–100.0)
Monocytes Absolute: 0.5 10*3/uL (ref 0.1–1.0)
Monocytes Relative: 8 % (ref 3–12)
NEUTROS PCT: 77 % (ref 43–77)
Neutro Abs: 5.1 10*3/uL (ref 1.7–7.7)
PLATELETS: 237 10*3/uL (ref 150–400)
RBC: 4.1 MIL/uL (ref 3.87–5.11)
RDW: 20.3 % — AB (ref 11.5–15.5)
WBC: 6.6 10*3/uL (ref 4.0–10.5)

## 2014-01-10 LAB — PRO B NATRIURETIC PEPTIDE: PRO B NATRI PEPTIDE: 7500 pg/mL — AB (ref 0–125)

## 2014-01-10 LAB — BASIC METABOLIC PANEL
BUN: 16 mg/dL (ref 6–23)
CO2: 22 mEq/L (ref 19–32)
Calcium: 9.2 mg/dL (ref 8.4–10.5)
Chloride: 102 mEq/L (ref 96–112)
Creatinine, Ser: 0.93 mg/dL (ref 0.50–1.10)
GFR, EST AFRICAN AMERICAN: 79 mL/min — AB (ref >90)
GFR, EST NON AFRICAN AMERICAN: 68 mL/min — AB (ref >90)
Glucose, Bld: 172 mg/dL — ABNORMAL HIGH (ref 70–99)
Potassium: 3.8 mEq/L (ref 3.7–5.3)
SODIUM: 139 meq/L (ref 137–147)

## 2014-01-10 LAB — TROPONIN I
Troponin I: <0.3 ng/mL (ref <0.30)
Troponin I: <0.3 ng/mL (ref <0.30)

## 2014-01-10 LAB — MAGNESIUM: Magnesium: 1.6 mg/dL (ref 1.5–2.5)

## 2014-01-10 MED ORDER — ALPRAZOLAM 1 MG PO TABS: 1.0000 mg | ORAL_TABLET | Freq: Every evening | ORAL | Status: DC | PRN

## 2014-01-10 MED ORDER — MORPHINE SULFATE 4 MG/ML IJ SOLN
4.0000 mg | INTRAMUSCULAR | Status: DC | PRN
  Administered 2014-01-10: 4 mg via INTRAVENOUS
  Filled 2014-01-10 (×2): qty 1

## 2014-01-10 MED ORDER — ONDANSETRON HCL 4 MG/2ML IJ SOLN
4.0000 mg | Freq: Once | INTRAMUSCULAR | Status: AC
  Administered 2014-01-10: 4 mg via INTRAVENOUS
  Filled 2014-01-10: qty 2

## 2014-01-10 MED ORDER — LORAZEPAM 2 MG/ML IJ SOLN
1.0000 mg | INTRAMUSCULAR | Status: AC | PRN
  Administered 2014-01-10 (×2): 1 mg via INTRAVENOUS
  Filled 2014-01-10 (×2): qty 1

## 2014-01-10 NOTE — ED Notes (Signed)
Pt continuing to hit call bell shouting out "xanax". MD made aware.

## 2014-01-10 NOTE — ED Notes (Signed)
Continues to ask for xanax, pt informed she has already had 2 mg of ativan. md made aware.

## 2014-01-10 NOTE — ED Notes (Signed)
Pt reports she is now having cp and felt her d/fib go off this morning.

## 2014-01-10 NOTE — ED Notes (Signed)
Pt yelling out screaming incomprehensible words. Pt starting to cry. Asked what is wrong, pt will not answer in words. Pt shaking head.

## 2014-01-10 NOTE — ED Notes (Signed)
Spoke with family member they will be coming to the hospital soon.

## 2014-01-10 NOTE — ED Notes (Signed)
Pt requesting more anxiety medication.

## 2014-01-10 NOTE — Discharge Instructions (Signed)
Follow up with your regular physician to refill your medications.  You have been given a prescription for Xanax only for a few days.  Do not reliably upon the emergency room to refill your prescriptions. Contact your physician before you run out

## 2014-01-10 NOTE — ED Notes (Signed)
Provided 8 oz. Soft drink with cup/ice per pt request.

## 2014-01-10 NOTE — ED Provider Notes (Addendum)
CSN: [id number]     Arrival date & time 01/10/14  1504 History   First MD Initiated Contact with Patient 01/10/14 [phone]     Chief Complaint  Patient presents with  . Chest Pain  . Anxiety      HPI  She presents via EMS claiming shortness of breath, anxiety, chest pain. Significant history including protein C deficiency, IVC filter, narcotic abuse, history of nonischemic cardiomyopathy, status post AICD, MCA sided CVA with incomplete aphasia. Chronic systolic congestive heart failure with EF of 10%. Paroxysmal A. fib. VT status post AICD shock rescues.  She's been out of her Xanax for several days. Progressive anxiety. Also states she "thinks" that her AICD fired today. Denies syncope. Complains of headache and chest pain. Symptoms started 2-3 days ago with anxiety and restlessness. AICD shocks according to the patient with this morning.  Past Medical History  Diagnosis Date  . Seizures   . Hypertension   . Stroke     2007 - R sided weakness and expressive aphasia with h/o behavioral problems related to this  . Asthma   . Diabetes mellitus   . Chronic systolic CHF (congestive heart failure)   . Atrial fibrillation     a. On Xarelto after having DVT (prev felt to be poor anticoag cand 2/2 noncompliance).  . History of DVT (deep vein thrombosis)     a. 06/2012: RLE DVT, + protein C deficiency, placed on Xarelto. s/p IVC filter  . Noncompliance     Due to mental status, family has had to coax her to take medicines  . Hypothyroidism   . VT (ventricular tachycardia)     a. h/o ICD ~2007-2008. b. Adm 08/2012 with UTI, had VT/ICD shock x 5 (hypokalemic);  c. 06/2013 s/p ICD Gen change: SJM DC ICD ser #: [id number]  . Protein C deficiency   . NICM (nonischemic cardiomyopathy)     a. s/p AICD ~2007 ([employer] Jude) - previous care [place]. No known hx CAD. b. EF 10% by echo 04/2012;  c. 06/2013 s/p ICD Gen change: SJM DC ICD ser #: [id number]  . Renal disorder     ?infection per brother   Past  Surgical History  Procedure Laterality Date  . Cholecystectomy    . Appendectomy    . Pacemaker insertion      [employer]  . Vena cava filter placement  07/19/2012    Procedure: INSERTION VENA-CAVA FILTER;  Surgeon: Larina Earthly, MD;  Location: [facility] OR;  Service: Vascular;  Laterality: [name];  . Insert / replace / remove pacemaker      ICD. [employer]  . Abdominal hysterectomy     Family History  Problem Relation Age of Onset  . Hypertension Mother   . Coronary artery disease Neg Hx    History  Substance Use Topics  . Smoking status: Former Games developer  . Smokeless tobacco: Never Used  . Alcohol Use: No   OB History   Grav Para Term Preterm Abortions TAB SAB Ect Mult Living                 Review of Systems  Constitutional: Negative for fever, chills, diaphoresis, appetite change and fatigue.  HENT: Negative for mouth sores, sore throat and trouble swallowing.   Eyes: Negative for visual disturbance.  Respiratory: Positive for chest tightness and shortness of breath. Negative for cough and wheezing.   Cardiovascular: Positive for chest pain.  Gastrointestinal: Negative for nausea, vomiting, abdominal pain, diarrhea and abdominal  distention.  Endocrine: Negative for polydipsia, polyphagia and polyuria.  Genitourinary: Negative for dysuria, frequency and hematuria.  Musculoskeletal: Negative for gait problem.  Skin: Negative for color change, pallor and rash.  Neurological: Positive for headaches. Negative for dizziness, syncope and light-headedness.  Hematological: Does not bruise/bleed easily.  Psychiatric/Behavioral: Negative for behavioral problems and confusion. The patient is nervous/anxious.       Allergies  Penicillins and Sulfa antibiotics  Home Medications   Prior to Admission medications   Medication Sig Start Date End Date Taking? Authorizing Provider  acetaminophen (TYLENOL) 325 MG tablet Take 650 mg by mouth every 6 (six) hours as needed for pain.    Historical  Provider, MD  albuterol (PROVENTIL) (2.5 MG/3ML) 0.083% nebulizer solution Take 2.5 mg by nebulization every 6 (six) hours as needed. For cough or wheeze    Historical Provider, MD  ALPRAZolam Prudy Feeler) 0.5 MG tablet Take 0.5 mg by mouth at bedtime as needed for sleep.    Historical Provider, MD  amiodarone (PACERONE) 200 MG tablet Take 1 tablet daily except none on weekend confirmed with family member 11/24/2013 11/24/13   Duke Salvia, MD  digoxin (LANOXIN) 0.125 MG tablet Take 1 tablet (0.125 mg total) by mouth daily. 09/01/13   Marinda Elk, MD  escitalopram (LEXAPRO) 20 MG tablet Take 1 tablet (20 mg total) by mouth daily. 09/01/13   Marinda Elk, MD  furosemide (LASIX) 40 MG tablet Take 40 mg by mouth 2 (two) times daily.    Historical Provider, MD  insulin lispro (HUMALOG) 100 UNIT/ML injection Inject into the skin 3 (three) times daily before meals. On sliding scale    Historical Provider, MD  levETIRAcetam (KEPPRA) 1000 MG tablet Take 2,000 mg by mouth 2 (two) times daily.    Historical Provider, MD  levothyroxine (SYNTHROID, LEVOTHROID) 50 MCG tablet Take 50 mcg by mouth daily before breakfast.    Historical Provider, MD  lisinopril (PRINIVIL,ZESTRIL) 5 MG tablet Take 1 tablet (5 mg total) by mouth daily. 12/17/13   Amy D Clegg, NP  metolazone (ZAROXOLYN) 2.5 MG tablet TAKE 1 TABLET BY MOUTH AS NEEDED. FOR FLUID RETENTION 12/25/13   Dolores Patty, MD  metoprolol tartrate (LOPRESSOR) 25 MG tablet Take 1 tablet (25 mg total) by mouth 2 (two) times daily. 09/01/13   Marinda Elk, MD  omeprazole (PRILOSEC) 20 MG capsule Take 20 mg by mouth 2 (two) times daily.    Historical Provider, MD  oxyCODONE-acetaminophen (PERCOCET) 10-325 MG per tablet Take 1 tablet by mouth every 8 (eight) hours as needed for pain.    Historical Provider, MD  polyethylene glycol (MIRALAX / GLYCOLAX) packet Take 17 g by mouth daily as needed. For constipation    Historical Provider, MD  potassium  chloride 20 MEQ/15ML (10%) solution Take 15 mLs (20 mEq total) by mouth daily. 09/01/13   Marinda Elk, MD  QUEtiapine Fumarate (SEROQUEL XR) 150 MG 24 hr tablet Take 1 tablet (150 mg total) by mouth at bedtime. 09/01/13   Marinda Elk, MD  Rivaroxaban (XARELTO) 20 MG TABS tablet Take 20 mg by mouth daily.    Historical Provider, MD  spironolactone (ALDACTONE) 25 MG tablet Take 25 mg by mouth daily.    Historical Provider, MD  Vitamin D, Ergocalciferol, (DRISDOL) 50000 UNITS CAPS capsule Take 50,000 Units by mouth every 30 (thirty) days.    Historical Provider, MD   BP 102/65  Pulse 88  Temp(Src) 97.9 F (36.6 C) (Oral)  Resp 24  SpO2 94% Physical Exam  Constitutional: She is oriented to person, place, and time. No distress.  Thin black female. Appears dyspneic and anxious.  HENT:  Head: Normocephalic.  No cranial nerve deficits. No facial droop.  Eyes: Conjunctivae are normal. Pupils are equal, round, and reactive to light. No scleral icterus.  Neck: Normal range of motion. Neck supple. No thyromegaly present.  Cardiovascular: Normal rate and regular rhythm.  Exam reveals no gallop and no friction rub.   No murmur heard. Sinus rhythm on the monitor.  Pulmonary/Chest: Tachypnea noted. No respiratory distress. She has no wheezes. She has no rales.  Clear bilateral breath sounds. Anxious and tachypneic  Abdominal: Soft. Bowel sounds are normal. She exhibits no distension. There is no tenderness. There is no rebound.  Musculoskeletal: Normal range of motion.  Neurological: She is alert and oriented to person, place, and time.  Skin: Skin is warm and dry. No rash noted.  No peripheral edema  Psychiatric: Her mood appears anxious.    ED Course  Procedures (including critical care time) Labs Review Labs Reviewed  CBC WITH DIFFERENTIAL - Abnormal; Notable for the following:    Hemoglobin 8.7 (*)    HCT 30.5 (*)    MCV 74.4 (*)    MCH 21.2 (*)    MCHC 28.5 (*)    RDW  20.3 (*)    All other components within normal limits  BASIC METABOLIC PANEL - Abnormal; Notable for the following:    Glucose, Bld 172 (*)    GFR calc non Af Amer 68 (*)    GFR calc Af Amer 79 (*)    All other components within normal limits  PRO B NATRIURETIC PEPTIDE - Abnormal; Notable for the following:    Pro B Natriuretic peptide (BNP) 7500.0 (*)    All other components within normal limits  TROPONIN I  MAGNESIUM  TROPONIN I    Imaging Review Ct Head Wo Contrast  01/10/2014   CLINICAL DATA:  Headache, vertigo, hypertension.  EXAM: CT HEAD WITHOUT CONTRAST  TECHNIQUE: Contiguous axial images were obtained from the base of the skull through the vertex without intravenous contrast.  COMPARISON:  11/12/2013  FINDINGS: Encephalomalacia is again seen in the left temporal, frontal, and parietal lobes secondary to remote large left MCA territory infarct. Ex vacuo dilatation left lateral ventricle is unchanged. There is no evidence of acute cortical infarct, intracranial hemorrhage, mass midline shift, or shortness of collection. Mastoid air cells are clear. Opacification of a posterior right ethmoid air cells unchanged.  IMPRESSION: No evidence of acute intracranial abnormality. Remote left MCA infarct.   Electronically Signed   By: Sebastian Ache   On: 01/10/2014 11:23   Dg Chest Port 1 View  01/10/2014   CLINICAL DATA:  Mid chest pain  EXAM: PORTABLE CHEST - 1 VIEW  COMPARISON:  DG ABD ACUTE W/CHEST dated 11/12/2013; DG CHEST 1 VIEW dated 10/12/2013; DG CHEST 2 VIEW dated 09/19/2013  FINDINGS: Severe cardiac enlargement stable. Cardiac pacer in unchanged position. Mild vascular congestion stable from prior study. No consolidation effusion or edema. No pneumothorax.  IMPRESSION: No acute findings.  Stable severe cardiac enlargement.   Electronically Signed   By: Esperanza Heir M.D.   On: 01/10/2014 09:49     EKG Interpretation None      MDM   Final diagnoses:  Anxiety  Anxiety disorder     Symptoms most consistent with anxiety. With her history she'll require extensive rule out peritonitis EKG, serial enzymes, labs  including enzymes/electrolytes/Mag chest x-ray. AICD interrogation. Benzodiazepines. Reevaluation.  10:35:  HB 8.7 (Baseline).  Trop normal.  Pro BNP 7500.  CXR s Failure.  Pt sleeping after Ativan.  Much less anxiety.  C/o HA.  CT requested 2/2 Ha on Xaralto-r/o bleed.  Irrigation a pacemaker shows no atrial ventricular events. No delivered shocks. No rescue pacing. Last episode of activity was 12/16/2013.  13:44:  Patient continues with intermittent episodes of anxiety. Discharged home. Given number of 10 Xanax to use at home. Have asked her to contact her physician regarding refill and could not rely upon the emergency room for medication refills in the future.  Do not find evidence of hypoxemia ACS CHF The patient rests comfortably in-between doses of medications    Rolland PorterMark Rashun Grattan, MD 01/10/14 1345  Rolland PorterMark Johnica Armwood, MD 01/10/14 [phone]

## 2014-01-10 NOTE — ED Notes (Signed)
[facility] still hasn't arrived. Had secretary call them again.

## 2014-01-10 NOTE — ED Notes (Signed)
[facility] at bedside 

## 2014-01-10 NOTE — ED Notes (Signed)
Per EMS - pt had anxiety last night and all through out the night. This morning pt c/o CP, 12 lead unremarkable. LBBB. Denies CP with EMS. Pt has hx of anxiety and is out of her medicine so she hasn't been able to take it. Pt reports this is what her anxiety attacks feel like. Denies sob/n/v. 98% on room air. Right sided deficits from previous stroke, pt's speech is baseline per family. Family also sts that this is a typical anxiety attack. BP 120/90 HR 82. CBG 199.

## 2014-01-14 ENCOUNTER — Encounter: Payer: Self-pay | Admitting: Internal Medicine

## 2014-01-14 ENCOUNTER — Ambulatory Visit (INDEPENDENT_AMBULATORY_CARE_PROVIDER_SITE_OTHER): Payer: Medicare Other | Admitting: Cardiology

## 2014-01-14 VITALS — BP 112/70 | HR 73

## 2014-01-14 DIAGNOSIS — Z95810 Presence of automatic (implantable) cardiac defibrillator: Secondary | ICD-10-CM

## 2014-01-14 DIAGNOSIS — I428 Other cardiomyopathies: Secondary | ICD-10-CM

## 2014-01-14 DIAGNOSIS — G8918 Other acute postprocedural pain: Secondary | ICD-10-CM

## 2014-01-14 LAB — MDC_IDC_ENUM_SESS_TYPE_INCLINIC
Battery Remaining Longevity: 98.4 mo
Brady Statistic RV Percent Paced: 0.33 %
Date Time Interrogation Session: 20150429185617
HighPow Impedance: 33.6477
Implantable Pulse Generator Model: 2411
Lead Channel Impedance Value: 340 Ohm
Lead Channel Impedance Value: 350 Ohm
Lead Channel Pacing Threshold Pulse Width: 0.5 ms
Lead Channel Sensing Intrinsic Amplitude: 11.6 mV
Lead Channel Setting Pacing Pulse Width: 0.5 ms
MDC IDC MSMT LEADCHNL RA PACING THRESHOLD AMPLITUDE: 0.5 V
MDC IDC MSMT LEADCHNL RA PACING THRESHOLD PULSEWIDTH: 0.5 ms
MDC IDC MSMT LEADCHNL RA SENSING INTR AMPL: 2.4 mV
MDC IDC MSMT LEADCHNL RV PACING THRESHOLD AMPLITUDE: 1 V
MDC IDC PG SERIAL: 7137541
MDC IDC SET LEADCHNL RA PACING AMPLITUDE: 2 V
MDC IDC SET LEADCHNL RV PACING AMPLITUDE: 2.5 V
MDC IDC SET LEADCHNL RV SENSING SENSITIVITY: 0.5 mV
MDC IDC SET ZONE DETECTION INTERVAL: 300 ms
MDC IDC STAT BRADY RA PERCENT PACED: 4.1 %
Zone Setting Detection Interval: 250 ms

## 2014-01-14 NOTE — Progress Notes (Signed)
ELECTROPHYSIOLOGY OFFICE NOTE   Patient ID: Elizabeth Love MRN: [id number], DOB/AGE: 1956/08/28   Date of Visit: 01/14/2014  Primary Physician: [name], [name], MD Primary Cardiologist: Gala Romney, MD Primary EP: Graciela Husbands, MD Reason for Visit: EP/device follow-up  History of Present Illness  Elizabeth Love is a 58 y.o. 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 She has had multiple visits for ICD implant site pain. She presents today for EP follow-up and evaluation of ICD implant site pain. She is accompanied by her brother. She points to her ICD site when asked about any pain. She answers yes that it is sharp and intermittent. She denies SOB. She denies palpitations or ICD shocks. She denies syncope.  Past Medical History Past Medical History  Diagnosis Date  . Seizures   . Hypertension   . Stroke     2007 - R sided weakness and expressive aphasia with h/o behavioral problems related to this  . Asthma   . Diabetes mellitus   . Chronic systolic CHF (congestive heart failure)   . Atrial fibrillation     a. On Xarelto after having DVT (prev felt to be poor anticoag cand 2/2 noncompliance).  . History of DVT (deep vein thrombosis)     a. 06/2012: RLE DVT, + protein C deficiency, placed on Xarelto. s/p IVC filter  . Noncompliance     Due to mental status, family has had to coax her to take medicines  . Hypothyroidism   . VT (ventricular tachycardia)     a. h/o ICD ~2007-2008. b. Adm 08/2012 with UTI, had VT/ICD shock x 5 (hypokalemic);  c. 06/2013 s/p ICD Gen change: SJM DC ICD ser #: [id number]  . Protein C deficiency   . NICM (nonischemic cardiomyopathy)     a. s/p AICD  ~2007 ([employer]) - previous care [facility]. No known hx CAD. b. EF 10% by echo 04/2012;  c. 06/2013 s/p ICD Gen change: SJM DC ICD ser #: [id number]  . Renal disorder     ?infection per brother    Past Surgical History Past Surgical History  Procedure Laterality Date  . Cholecystectomy    . Appendectomy    . Pacemaker insertion      [employer]  . Vena cava filter placement  07/19/2012    Procedure: INSERTION VENA-CAVA FILTER;  Surgeon: Larina Earthly, MD;  Location: [facility];  Service: Vascular;  Laterality: [name];  . Insert / replace / remove pacemaker      ICD. [employer]  . Abdominal hysterectomy      Allergies/Intolerances Allergies  Allergen Reactions  . Penicillins Rash  . Sulfa Antibiotics Rash    Current Home Medications Current Outpatient Prescriptions  Medication Sig Dispense Refill  . acetaminophen (TYLENOL) 325 MG tablet Take 650 mg by mouth every 6 (six) hours as needed for pain.      Marland Kitchen albuterol (PROVENTIL) (2.5 MG/3ML) 0.083% nebulizer solution Take 2.5 mg by nebulization every 6 (six) hours as needed. For cough or wheeze      . ALPRAZolam (XANAX) 0.5 MG tablet Take 0.5 mg by mouth at bedtime as needed for sleep.      Marland Kitchen ALPRAZolam (XANAX) 1 MG tablet Take 1 tablet (1 mg  total) by mouth at bedtime as needed for anxiety.  10 tablet  0  . amiodarone (PACERONE) 200 MG tablet Take 1 tablet daily except none on weekend confirmed with family member 11/24/2013  30 tablet  6  . digoxin (LANOXIN) 0.125 MG tablet Take 1 tablet (0.125 mg total) by mouth daily.  30 tablet  0  . escitalopram (LEXAPRO) 20 MG tablet Take 1 tablet (20 mg total) by mouth daily.      . furosemide (LASIX) 40 MG tablet Take 40 mg by mouth 2 (two) times daily.      . insulin lispro (HUMALOG) 100 UNIT/ML injection Inject into the skin 3 (three) times daily before meals. On sliding scale      . levETIRAcetam (KEPPRA) 1000 MG tablet Take 2,000 mg by mouth 2 (two) times daily.      Marland Kitchen. levothyroxine (SYNTHROID, LEVOTHROID)  50 MCG tablet Take 50 mcg by mouth daily before breakfast.      . lisinopril (PRINIVIL,ZESTRIL) 5 MG tablet Take 1 tablet (5 mg total) by mouth daily.  30 tablet  6  . metolazone (ZAROXOLYN) 2.5 MG tablet TAKE 1 TABLET BY MOUTH AS NEEDED. FOR FLUID RETENTION  5 tablet  6  . metoprolol tartrate (LOPRESSOR) 25 MG tablet Take 1 tablet (25 mg total) by mouth 2 (two) times daily.      Marland Kitchen. omeprazole (PRILOSEC) 20 MG capsule Take 20 mg by mouth 2 (two) times daily.      Marland Kitchen. oxyCODONE-acetaminophen (PERCOCET) 10-325 MG per tablet Take 1 tablet by mouth every 8 (eight) hours as needed for pain.      . polyethylene glycol (MIRALAX / GLYCOLAX) packet Take 17 g by mouth daily as needed. For constipation      . potassium chloride 20 MEQ/15ML (10%) solution Take 15 mLs (20 mEq total) by mouth daily.  500 mL  0  . QUEtiapine Fumarate (SEROQUEL XR) 150 MG 24 hr tablet Take 1 tablet (150 mg total) by mouth at bedtime.  30 tablet  0  . Rivaroxaban (XARELTO) 20 MG TABS tablet Take 20 mg by mouth daily.      Marland Kitchen. spironolactone (ALDACTONE) 25 MG tablet Take 25 mg by mouth daily.      . Vitamin D, Ergocalciferol, (DRISDOL) 50000 UNITS CAPS capsule Take 50,000 Units by mouth every 30 (thirty) days.       No current facility-administered medications for this visit.    Social History History   Social History  . Marital Status: Single    Spouse Name: [name]    Number of Children: [name]  . Years of Education: [name]   Occupational History  . Not on file.   Social History Main Topics  . Smoking status: Former Games developermoker  . Smokeless tobacco: Never Used  . Alcohol Use: No  . Drug Use: No  . Sexual Activity: No   Other Topics Concern  . Not on file   Social History Narrative   ** Merged History Encounter **         Review of Systems General: No chills, fever, night sweats or weight changes Cardiovascular: No chest pain, dyspnea on exertion, edema, orthopnea, palpitations, paroxysmal nocturnal dyspnea Dermatological:  No rash, lesions or masses Respiratory: No cough, dyspnea Urologic: No hematuria, dysuria Abdominal: No nausea, vomiting, diarrhea, bright red blood per rectum, melena, or hematemesis Neurologic: No visual changes, weakness, changes in mental status All other systems reviewed and are otherwise negative except as noted above.  Physical Exam Vitals: Blood  pressure 112/70, pulse 73.  General: Well developed 58 y.o. female in no acute distress. HEENT: Normocephalic, atraumatic. EOMs intact. Sclera nonicteric. Oropharynx clear.  Neck: Supple. No JVD. Lungs: Respirations regular and unlabored, CTA bilaterally. No wheezes, rales or rhonchi. Heart: RRR. S1, S2 present. No murmurs, rub, S3 or S4. Abdomen: Soft, non-distended.  Extremities: No clubbing, cyanosis or edema. PT/Radials 2+ and equal bilaterally. Psych: Normal affect. Neuro: Alert and oriented X 3. Moves all extremities spontaneously. Skin: Left upper chest / implant site intact and well healed. No erythema, edema, warmth, drainage or hematoma. Skin freely mobile over device with no evidence of tethering.   Diagnostics Echocardiogram November 2014 Study Conclusions - Left ventricle: The cavity size was severely dilated. Wall thickness was increased in a pattern of mild LVH. The estimated ejection fraction was 20%. Diffuse hypokinesis. Doppler parameters are consistent with high ventricular filling pressure. - Mitral valve: Calcified annulus. Moderate regurgitation. - Left atrium: The atrium was moderately to severely dilated. - Right atrium: The atrium was mildly dilated. - Tricuspid valve: Moderate regurgitation. - Pulmonary arteries: PA peak pressure: 19mm Hg (S). - Pericardium, extracardiac: A trivial pericardial effusion was identified posterior to the heart. Device interrogation today - Normal device function. Thresholds and sensing consistent with previous device measurements. Impedance trends stable over time. No evidence  of any ventricular arrhythmias. 1 mode switch episode, 6 seconds in duration, EGM reviewed and no evidence of AFib. Histogram distribution appropriate for patient and level of activity. No changes made this session. Device programmed at appropriate safety margins. Device programmed to optimize intrinsic conduction. Estimated longevity 6.4 - 8.2 years.   Assessment and Plan  1. ICD implant site pain Site is intact and well healed No evidence of inflammation or infection Provided reassurance and instructed her and her brother to contact us if she develops any signs of infection, ie-redness, swelling or warmth at site and fever / chills  2. NICM s/p ICD implant Normal device function No ventricular arrhythmias No programming changes made Continue routine remote ICD follow-up every 3 months Return for follow-up with Dr. Graciela Husbands as scheduled in 3 months  Signed, Minda Meo, PA-C 01/14/2014, 6:57 PM

## 2014-01-14 NOTE — Patient Instructions (Signed)
Your physician recommends that you schedule a follow-up appointment in: 3 months with Dr, Graciela Husbands  Your physician recommends that you continue on your current medications as directed. Please refer to the Current Medication list given to you today.

## 2014-01-15 ENCOUNTER — Encounter: Payer: Self-pay | Admitting: Cardiology

## 2014-01-16 ENCOUNTER — Telehealth: Payer: Self-pay | Admitting: Family

## 2014-01-16 NOTE — Telephone Encounter (Signed)
Please contact pt and arrange OV. She is past due.

## 2014-01-16 NOTE — Telephone Encounter (Signed)
Left message for patient/son to return my call.

## 2014-01-18 ENCOUNTER — Encounter: Payer: Self-pay | Admitting: Cardiology

## 2014-01-20 NOTE — Telephone Encounter (Signed)
Left message for patient to return my call.

## 2014-01-22 ENCOUNTER — Telehealth: Payer: Self-pay | Admitting: *Deleted

## 2014-01-22 NOTE — Telephone Encounter (Signed)
Left message for patient to return my call.

## 2014-01-22 NOTE — Telephone Encounter (Signed)
PA to [employer] for amiodarone

## 2014-01-23 NOTE — Telephone Encounter (Signed)
Mailed letter °

## 2014-01-26 NOTE — Telephone Encounter (Signed)
No PA needed for the amiodarone 200 mg, pharmacy notified

## 2014-02-11 ENCOUNTER — Encounter (HOSPITAL_COMMUNITY): Payer: Self-pay | Admitting: Emergency Medicine

## 2014-02-11 ENCOUNTER — Emergency Department (HOSPITAL_COMMUNITY)
Admission: EM | Admit: 2014-02-11 | Discharge: 2014-02-11 | Disposition: A | Discharge status: Home / Self Care | Payer: Medicare Other | Attending: Emergency Medicine | Admitting: Emergency Medicine

## 2014-02-11 ENCOUNTER — Emergency Department (HOSPITAL_COMMUNITY): Payer: Medicare Other

## 2014-02-11 DIAGNOSIS — Z7901 Long term (current) use of anticoagulants: Secondary | ICD-10-CM | POA: Insufficient documentation

## 2014-02-11 DIAGNOSIS — I4891 Unspecified atrial fibrillation: Secondary | ICD-10-CM | POA: Insufficient documentation

## 2014-02-11 DIAGNOSIS — R079 Chest pain, unspecified: Secondary | ICD-10-CM | POA: Insufficient documentation

## 2014-02-11 DIAGNOSIS — Z950 Presence of cardiac pacemaker: Secondary | ICD-10-CM | POA: Insufficient documentation

## 2014-02-11 DIAGNOSIS — Z86718 Personal history of other venous thrombosis and embolism: Secondary | ICD-10-CM | POA: Insufficient documentation

## 2014-02-11 DIAGNOSIS — Z87891 Personal history of nicotine dependence: Secondary | ICD-10-CM | POA: Insufficient documentation

## 2014-02-11 DIAGNOSIS — G8929 Other chronic pain: Secondary | ICD-10-CM | POA: Insufficient documentation

## 2014-02-11 DIAGNOSIS — I10 Essential (primary) hypertension: Secondary | ICD-10-CM | POA: Insufficient documentation

## 2014-02-11 DIAGNOSIS — E039 Hypothyroidism, unspecified: Secondary | ICD-10-CM | POA: Insufficient documentation

## 2014-02-11 DIAGNOSIS — J45909 Unspecified asthma, uncomplicated: Secondary | ICD-10-CM | POA: Insufficient documentation

## 2014-02-11 DIAGNOSIS — Z8673 Personal history of transient ischemic attack (TIA), and cerebral infarction without residual deficits: Secondary | ICD-10-CM | POA: Insufficient documentation

## 2014-02-11 DIAGNOSIS — Z87448 Personal history of other diseases of urinary system: Secondary | ICD-10-CM | POA: Insufficient documentation

## 2014-02-11 DIAGNOSIS — G40909 Epilepsy, unspecified, not intractable, without status epilepticus: Secondary | ICD-10-CM | POA: Insufficient documentation

## 2014-02-11 DIAGNOSIS — M79609 Pain in unspecified limb: Secondary | ICD-10-CM | POA: Insufficient documentation

## 2014-02-11 DIAGNOSIS — Z794 Long term (current) use of insulin: Secondary | ICD-10-CM | POA: Insufficient documentation

## 2014-02-11 DIAGNOSIS — Z79899 Other long term (current) drug therapy: Secondary | ICD-10-CM | POA: Insufficient documentation

## 2014-02-11 DIAGNOSIS — Z880 Allergy status to penicillin: Secondary | ICD-10-CM | POA: Insufficient documentation

## 2014-02-11 DIAGNOSIS — I5022 Chronic systolic (congestive) heart failure: Secondary | ICD-10-CM | POA: Insufficient documentation

## 2014-02-11 DIAGNOSIS — E119 Type 2 diabetes mellitus without complications: Secondary | ICD-10-CM | POA: Insufficient documentation

## 2014-02-11 LAB — I-STAT CHEM 8, ED
BUN: 23 mg/dL (ref 6–23)
Calcium, Ion: 1.18 mmol/L (ref 1.12–1.23)
Chloride: 101 mEq/L (ref 96–112)
Creatinine, Ser: 1 mg/dL (ref 0.50–1.10)
Glucose, Bld: 166 mg/dL — ABNORMAL HIGH (ref 70–99)
HEMATOCRIT: 37 % (ref 36.0–46.0)
HEMOGLOBIN: 12.6 g/dL (ref 12.0–15.0)
Potassium: 4.8 mEq/L (ref 3.7–5.3)
SODIUM: 140 meq/L (ref 137–147)
TCO2: 24 mmol/L (ref 0–100)

## 2014-02-11 LAB — I-STAT TROPONIN, ED: TROPONIN I, POC: 0.05 ng/mL (ref 0.00–0.08)

## 2014-02-11 MED ORDER — LORAZEPAM 2 MG/ML IJ SOLN
1.0000 mg | Freq: Once | INTRAMUSCULAR | Status: AC
  Administered 2014-02-11: 1 mg via INTRAVENOUS
  Filled 2014-02-11: qty 1

## 2014-02-11 MED ORDER — HYDROMORPHONE HCL PF 1 MG/ML IJ SOLN
0.5000 mg | Freq: Once | INTRAMUSCULAR | Status: AC
  Administered 2014-02-11: 0.5 mg via INTRAVENOUS
  Filled 2014-02-11: qty 1

## 2014-02-11 MED ORDER — HYDROCODONE-ACETAMINOPHEN 5-325 MG PO TABS
2.0000 | ORAL_TABLET | Freq: Once | ORAL | Status: AC
  Administered 2014-02-11: 1 via ORAL
  Filled 2014-02-11: qty 2

## 2014-02-11 MED ORDER — HYDROCODONE-ACETAMINOPHEN 5-325 MG PO TABS
1.0000 | ORAL_TABLET | Freq: Once | ORAL | Status: AC
  Administered 2014-02-11: 1 via ORAL
  Filled 2014-02-11: qty 1

## 2014-02-11 MED ORDER — ALPRAZOLAM 0.25 MG PO TABS
0.2500 mg | ORAL_TABLET | Freq: Once | ORAL | Status: AC
  Administered 2014-02-11: 0.25 mg via ORAL
  Filled 2014-02-11: qty 1

## 2014-02-11 MED ORDER — ALPRAZOLAM 0.25 MG PO TABS
1.0000 mg | ORAL_TABLET | Freq: Once | ORAL | Status: AC
  Administered 2014-02-11: 0.75 mg via ORAL
  Filled 2014-02-11: qty 4

## 2014-02-11 NOTE — Progress Notes (Signed)
  CARE MANAGEMENT ED NOTE 02/11/2014  Patient:  Elizabeth Love, Elizabeth Love   Account Number:  [id number]  Date Initiated:  02/11/2014  Documentation initiated by:  Ferdinand Cava  Subjective/Objective Assessment:   58 yo female presenting to the ED with pain     Subjective/Objective Assessment Detail:   HPI Comments: 58 year old female with complicated medical history including chronic pain, diabetes, stroke and aphasia, narcotic abuse, hypothyroidism, AICD, DVT, high blood pressure presents with worsening bilateral leg pain worse in the right hand pain at defibrillator site. Patient has had both of these 4 weeks and is similar but more severe. Worse with palpation and skin is hypersensitive. No new shortness of breath or chest pain. Difficult history of present illness do 2 aphasia.     Action/Plan:   Patient to be followed by [facility] RN for medication management and increased PCP communication for health care needs.   Action/Plan Detail:   Anticipated DC Date:       Status Recommendation to Physician:   Result of Recommendation:        Choice offered to / List presented to:            Status of service:    ED Comments:   ED Comments Detail:  CM consulted for home health needs. CM spoke with patient that has expressive aphasia which made information gathering difficult. Patient was able to answer few questions but otherwise would answer "I don't know". This CM called the patient son, Sharlisa Kanatzar, and he stated that he lives with his mother and his uncle Dorinda Hill also lives in the home. Feliz Beam stated that his mother uses a walker in the home and he manages her medications. He stated that either he or his uncle are in the home with the patient and she is never alone. Feliz Beam stated that the patient had [facility] HH services in the past and he would like to use [facility] again. Also explained to Mora that the medical work up in the ED has been negative and there is no medical need to admit his mother therefore  she will be discharge home with [facility] services. Feliz Beam stated that he understood and was appreciative of information. [facility] was contacted to initiate [facility] services EDP updated and HH order entered.

## 2014-02-11 NOTE — ED Notes (Signed)
Pt arrives via EMS from home. Family reports that pt was c/o leg pain yesterday and then the pain subsided. States it started again today at 5p. In the room the pt is c/o leg pain and then brings her hand all the way up her body and points to her pacemaker. States that pacer/defibrillator hurts. Also c/o headache Pt has hx seizures, stroke with rt sided deficits and speech impairment. Pts family states that they have asked her dr why she is sometimes in unbearable pain and he dr told them because she has neurological hx. 120/79 HR 80. 98% RA. Pt is tearful in room.

## 2014-02-11 NOTE — ED Provider Notes (Signed)
0800 - Patient getting seen by Sue Lush with Case Management. She spoke with patient's son who is at home and helps with medication management. Son is [employer]. Patient does not meet admission criteria, however could use some help in the home with medication management, overall care based on Andrea's assessment after talking with the son and evaluating the patient. Home Health Face-to-Face assessment ordered. Patient stable for discharge.   1. Chronic pain      Dagmar Hait, MD 02/11/14 [phone]

## 2014-02-11 NOTE — ED Notes (Signed)
Pt back from x-ray.

## 2014-02-11 NOTE — ED Notes (Signed)
Social work at bedside.  

## 2014-02-11 NOTE — Discharge Instructions (Signed)

## 2014-02-11 NOTE — ED Provider Notes (Signed)
CSN: [id number]     Arrival date & time 02/11/14  0056 History   First MD Initiated Contact with Patient 02/11/14 0138     Chief Complaint  Patient presents with  . Pain     (Consider location/radiation/quality/duration/timing/severity/associated sxs/prior Treatment) HPI Comments: 58 year old female with complicated medical history including chronic pain, diabetes, stroke and aphasia, narcotic abuse, hypothyroidism, AICD, DVT, high blood pressure presents with worsening bilateral leg pain worse in the right hand pain at defibrillator site. Patient has had both of these 4 weeks and is similar but more severe. Worse with palpation and skin is hypersensitive. No new shortness of breath or chest pain. Difficult history of present illness do 2 aphasia.    The history is provided by the patient and medical records.    Past Medical History  Diagnosis Date  . Seizures   . Hypertension   . Stroke     2007 - R sided weakness and expressive aphasia with h/o behavioral problems related to this  . Asthma   . Diabetes mellitus   . Chronic systolic CHF (congestive heart failure)   . Atrial fibrillation     a. On Xarelto after having DVT (prev felt to be poor anticoag cand 2/2 noncompliance).  . History of DVT (deep vein thrombosis)     a. 06/2012: RLE DVT, + protein C deficiency, placed on Xarelto. s/p IVC filter  . Noncompliance     Due to mental status, family has had to coax her to take medicines  . Hypothyroidism   . VT (ventricular tachycardia)     a. h/o ICD ~2007-2008. b. Adm 08/2012 with UTI, had VT/ICD shock x 5 (hypokalemic);  c. 06/2013 s/p ICD Gen change: SJM DC ICD ser #: [id number]  . Protein C deficiency   . NICM (nonischemic cardiomyopathy)     a. s/p AICD ~2007 ([employer]) - previous care [facility]. No known hx CAD. b. EF 10% by echo 04/2012;  c. 06/2013 s/p ICD Gen change: SJM DC ICD ser #: [id number]  . Renal disorder     ?infection per brother   Past Surgical History  Procedure  Laterality Date  . Cholecystectomy    . Appendectomy    . Pacemaker insertion      [employer]  . Vena cava filter placement  07/19/2012    Procedure: INSERTION VENA-CAVA FILTER;  Surgeon: Larina Earthlyodd F Early, MD;  [facility];  Service: Vascular;  Laterality: [name];  . Insert / replace / remove pacemaker      ICD. [employer]  . Abdominal hysterectomy     Family History  Problem Relation Age of Onset  . Hypertension Mother   . Coronary artery disease Neg Hx    History  Substance Use Topics  . Smoking status: Former Games developermoker  . Smokeless tobacco: Never Used  . Alcohol Use: No   OB History   Grav Para Term Preterm Abortions TAB SAB Ect Mult Living                 Review of Systems  Constitutional: Negative for fever and chills.  HENT: Negative for congestion.   Eyes: Negative for visual disturbance.  Respiratory: Negative for shortness of breath.   Cardiovascular: Positive for chest pain (defibrillator site pain).  Gastrointestinal: Negative for vomiting and abdominal pain.  Genitourinary: Negative for dysuria and flank pain.  Musculoskeletal: Positive for arthralgias. Negative for back pain, neck pain and neck stiffness.  Skin: Negative for rash.  Neurological: Negative  for light-headedness and headaches.      Allergies  Penicillins and Sulfa antibiotics  Home Medications   Prior to Admission medications   Medication Sig Start Date End Date Taking? Authorizing Provider  acetaminophen (TYLENOL) 325 MG tablet Take 650 mg by mouth every 6 (six) hours as needed for pain.    Historical Provider, MD  albuterol (PROVENTIL) (2.5 MG/3ML) 0.083% nebulizer solution Take 2.5 mg by nebulization every 6 (six) hours as needed. For cough or wheeze    Historical Provider, MD  ALPRAZolam Prudy Feeler) 0.5 MG tablet Take 0.5 mg by mouth at bedtime as needed for sleep.    Historical Provider, MD  ALPRAZolam Prudy Feeler) 1 MG tablet Take 1 tablet (1 mg total) by mouth at bedtime as needed for anxiety.  01/10/14   Rolland Porter, MD  amiodarone (PACERONE) 200 MG tablet Take 1 tablet daily except none on weekend confirmed with family member 11/24/2013 11/24/13   Duke Salvia, MD  digoxin (LANOXIN) 0.125 MG tablet Take 1 tablet (0.125 mg total) by mouth daily. 09/01/13   Marinda Elk, MD  escitalopram (LEXAPRO) 20 MG tablet Take 1 tablet (20 mg total) by mouth daily. 09/01/13   Marinda Elk, MD  furosemide (LASIX) 40 MG tablet Take 40 mg by mouth 2 (two) times daily.    Historical Provider, MD  insulin lispro (HUMALOG) 100 UNIT/ML injection Inject into the skin 3 (three) times daily before meals. On sliding scale    Historical Provider, MD  levETIRAcetam (KEPPRA) 1000 MG tablet Take 2,000 mg by mouth 2 (two) times daily.    Historical Provider, MD  levothyroxine (SYNTHROID, LEVOTHROID) 50 MCG tablet Take 50 mcg by mouth daily before breakfast.    Historical Provider, MD  lisinopril (PRINIVIL,ZESTRIL) 5 MG tablet Take 1 tablet (5 mg total) by mouth daily. 12/17/13   Amy D Clegg, NP  metolazone (ZAROXOLYN) 2.5 MG tablet TAKE 1 TABLET BY MOUTH AS NEEDED. FOR FLUID RETENTION 12/25/13   Dolores Patty, MD  metoprolol tartrate (LOPRESSOR) 25 MG tablet Take 1 tablet (25 mg total) by mouth 2 (two) times daily. 09/01/13   Marinda Elk, MD  omeprazole (PRILOSEC) 20 MG capsule Take 20 mg by mouth 2 (two) times daily.    Historical Provider, MD  oxyCODONE-acetaminophen (PERCOCET) 10-325 MG per tablet Take 1 tablet by mouth every 8 (eight) hours as needed for pain.    Historical Provider, MD  polyethylene glycol (MIRALAX / GLYCOLAX) packet Take 17 g by mouth daily as needed. For constipation    Historical Provider, MD  potassium chloride 20 MEQ/15ML (10%) solution Take 15 mLs (20 mEq total) by mouth daily. 09/01/13   Marinda Elk, MD  QUEtiapine Fumarate (SEROQUEL XR) 150 MG 24 hr tablet Take 1 tablet (150 mg total) by mouth at bedtime. 09/01/13   Marinda Elk, MD  Rivaroxaban (XARELTO)  20 MG TABS tablet Take 20 mg by mouth daily.    Historical Provider, MD  spironolactone (ALDACTONE) 25 MG tablet Take 25 mg by mouth daily.    Historical Provider, MD  Vitamin D, Ergocalciferol, (DRISDOL) 50000 UNITS CAPS capsule Take 50,000 Units by mouth every 30 (thirty) days.    Historical Provider, MD   BP 110/71  Pulse 77  Temp(Src) 98.4 F (36.9 C) (Oral)  Resp 29  Ht 5\' 6"  (1.676 m)  Wt 115 lb (52.164 kg)  BMI 18.57 kg/m2  SpO2 96% Physical Exam  Nursing note and vitals reviewed. Constitutional: She is oriented to  person, place, and time. She appears well-developed and well-nourished.  HENT:  Head: Normocephalic and atraumatic.  Eyes: Conjunctivae are normal. Right eye exhibits no discharge. Left eye exhibits no discharge.  Neck: Normal range of motion. Neck supple. No tracheal deviation present.  Cardiovascular: Normal rate and regular rhythm.   Pulmonary/Chest: Effort normal. She has rales (few at bases).  Abdominal: Soft. She exhibits no distension. There is no tenderness. There is no guarding.  Musculoskeletal: She exhibits tenderness. She exhibits no edema.  Patient has hypersensitive/painful lower extremities bilaterally and feet worse on the right. Full range of motion of joints, no sign of infection or significant swelling. Patient has mild pain at AICD site without swelling warmth or sign of infection.  Neurological: She is alert and oriented to person, place, and time.  Aphasia chronic  Skin: Skin is warm. No rash noted.  Psychiatric: She has a normal mood and affect.    ED Course  Procedures (including critical care time) Labs Review Labs Reviewed  I-STAT CHEM 8, ED - Abnormal; Notable for the following:    Glucose, Bld 166 (*)    All other components within normal limits  Rosezena Sensor, ED    Imaging Review Dg Chest 2 View  02/11/2014   CLINICAL DATA:  Chest pain.  EXAM: CHEST  2 VIEW  COMPARISON:  Chest radiograph performed 01/10/2014  FINDINGS: The  lungs are well-aerated. Vascular congestion is noted. Increased interstitial markings raise concern for mild pulmonary edema. Right basilar airspace opacity is seen. There is no evidence of pleural effusion or pneumothorax.  The heart is enlarged. A pacemaker/AICD is noted at the left chest wall, with leads ending at the right atrium and right ventricle. No acute osseous abnormalities are seen.  IMPRESSION: Mild right basilar airspace opacity may reflect atelectasis. Vascular congestion and cardiomegaly noted. Increased interstitial markings could reflect mild interstitial edema.   Electronically Signed   By: Roanna Raider M.D.   On: 02/11/2014 03:48   Dg Ankle Complete Right  02/11/2014   CLINICAL DATA:  Pain.  EXAM: RIGHT FOOT COMPLETE - 3+ VIEW; RIGHT ANKLE - COMPLETE 3+ VIEW  COMPARISON:  Right ankle radiograph November 28, 2012.  FINDINGS: No fracture deformity nor dislocation. The ankle mortise appears congruent and the tibiofibular syndesmosis intact. No destructive bony lesions. Patient is osteopenic. Moderate first metatarsophalangeal osteoarthrosis. Type 2 navicular bone. Soft tissue planes are non-suspicious.  IMPRESSION: No acute fracture deformity dislocation.  Moderate first metatarsophalangeal osteoarthrosis.  Osteopenia, which decreases sensitivity for acute nondisplaced fractures.   Electronically Signed   By: Awilda Metro   On: 02/11/2014 03:48   Dg Foot Complete Left  02/11/2014   CLINICAL DATA:  Bilateral foot pain.  EXAM: LEFT FOOT - COMPLETE 3+ VIEW  COMPARISON:  None.  FINDINGS: There is no evidence of fracture or dislocation. The joint spaces are preserved. There is no evidence of talar subluxation; the subtalar joint is unremarkable in appearance. Os naviculare fragments are noted.  No significant soft tissue abnormalities are seen.  IMPRESSION: 1. No evidence of fracture or dislocation. 2. Os naviculare noted.   Electronically Signed   By: Roanna Raider M.D.   On: 02/11/2014 03:49    Dg Foot Complete Right  02/11/2014   CLINICAL DATA:  Pain.  EXAM: RIGHT FOOT COMPLETE - 3+ VIEW; RIGHT ANKLE - COMPLETE 3+ VIEW  COMPARISON:  Right ankle radiograph November 28, 2012.  FINDINGS: No fracture deformity nor dislocation. The ankle mortise appears congruent and the tibiofibular  syndesmosis intact. No destructive bony lesions. Patient is osteopenic. Moderate first metatarsophalangeal osteoarthrosis. Type 2 navicular bone. Soft tissue planes are non-suspicious.  IMPRESSION: No acute fracture deformity dislocation.  Moderate first metatarsophalangeal osteoarthrosis.  Osteopenia, which decreases sensitivity for acute nondisplaced fractures.   Electronically Signed   By: Awilda Metro   On: 02/11/2014 03:48     EKG Interpretation   Date/Time:  Wednesday Feb 11 2014 01:05:45 EDT Ventricular Rate:  85 PR Interval:  140 QRS Duration: 132 QT Interval:  444 QTC Calculation: 528 R Axis:   84 Text Interpretation:  Age not entered, assumed to be  58 years old for  purpose of ECG interpretation Ectopic atrial rhythm Left bundle branch  block similar to previous Confirmed by Cherly Erno  MD, Nikoleta Dady ([id number]) on  02/11/2014 5:05:41 AM      MDM   Final diagnoses:  None   Patient with worsening pain at multiple sites. Clinical differential post stroke pain, musculoskeletal, occult fracture. Although patient has complicated medical history I do not feel she is presenting with cardiac cause of her symptoms. Narcotic pain medicines given in x-rays performed with no acute findings, mild arthritis seen. EKG left bundle branch block similar previous. On recheck patient had only mild improvement of symptoms. Oral Norco ordered and social work consult to ensure patient has assistance at home. Patient asking to be admitted for pain control however I feel the risks of obtaining an infection and no medical reason for admission at this time thus outpatient followup would be more appropriate.  I discussed  the case with case management for assistance in providing close followup outpatient and home health. They will evaluate and give further recommendations and assistance. Patient pulmonary primary reason for the ED visit is for pain in her legs and at her AICD site.  Final disposition pending but likely close outpatient followup.  Leg pain bilateral, CHF  Enid Skeens, MD 02/11/14 [id number]

## 2014-02-11 NOTE — ED Notes (Signed)
[facility] arrived to Transport pt home.

## 2014-02-11 NOTE — ED Notes (Signed)
Pt taken to xray 

## 2014-02-12 ENCOUNTER — Emergency Department (HOSPITAL_BASED_OUTPATIENT_CLINIC_OR_DEPARTMENT_OTHER)
Admission: EM | Admit: 2014-02-12 | Discharge: 2014-02-13 | Disposition: A | Discharge status: Home / Self Care | Payer: Medicare Other | Attending: Emergency Medicine | Admitting: Emergency Medicine

## 2014-02-12 ENCOUNTER — Encounter (HOSPITAL_BASED_OUTPATIENT_CLINIC_OR_DEPARTMENT_OTHER): Payer: Self-pay | Admitting: Emergency Medicine

## 2014-02-12 DIAGNOSIS — Z7901 Long term (current) use of anticoagulants: Secondary | ICD-10-CM | POA: Insufficient documentation

## 2014-02-12 DIAGNOSIS — G43909 Migraine, unspecified, not intractable, without status migrainosus: Secondary | ICD-10-CM | POA: Insufficient documentation

## 2014-02-12 DIAGNOSIS — Z8673 Personal history of transient ischemic attack (TIA), and cerebral infarction without residual deficits: Secondary | ICD-10-CM | POA: Insufficient documentation

## 2014-02-12 DIAGNOSIS — Z794 Long term (current) use of insulin: Secondary | ICD-10-CM | POA: Diagnosis not present

## 2014-02-12 DIAGNOSIS — Z950 Presence of cardiac pacemaker: Secondary | ICD-10-CM | POA: Diagnosis not present

## 2014-02-12 DIAGNOSIS — Z87891 Personal history of nicotine dependence: Secondary | ICD-10-CM | POA: Insufficient documentation

## 2014-02-12 DIAGNOSIS — Z79899 Other long term (current) drug therapy: Secondary | ICD-10-CM | POA: Insufficient documentation

## 2014-02-12 DIAGNOSIS — J45909 Unspecified asthma, uncomplicated: Secondary | ICD-10-CM | POA: Insufficient documentation

## 2014-02-12 DIAGNOSIS — E039 Hypothyroidism, unspecified: Secondary | ICD-10-CM | POA: Insufficient documentation

## 2014-02-12 DIAGNOSIS — Z87448 Personal history of other diseases of urinary system: Secondary | ICD-10-CM | POA: Diagnosis not present

## 2014-02-12 DIAGNOSIS — I10 Essential (primary) hypertension: Secondary | ICD-10-CM | POA: Diagnosis not present

## 2014-02-12 DIAGNOSIS — I5022 Chronic systolic (congestive) heart failure: Secondary | ICD-10-CM | POA: Diagnosis not present

## 2014-02-12 DIAGNOSIS — I4891 Unspecified atrial fibrillation: Secondary | ICD-10-CM | POA: Insufficient documentation

## 2014-02-12 DIAGNOSIS — G8929 Other chronic pain: Secondary | ICD-10-CM | POA: Insufficient documentation

## 2014-02-12 DIAGNOSIS — E119 Type 2 diabetes mellitus without complications: Secondary | ICD-10-CM | POA: Insufficient documentation

## 2014-02-12 DIAGNOSIS — Z880 Allergy status to penicillin: Secondary | ICD-10-CM | POA: Insufficient documentation

## 2014-02-12 DIAGNOSIS — F410 Panic disorder [episodic paroxysmal anxiety] without agoraphobia: Secondary | ICD-10-CM | POA: Diagnosis present

## 2014-02-12 DIAGNOSIS — Z86718 Personal history of other venous thrombosis and embolism: Secondary | ICD-10-CM | POA: Insufficient documentation

## 2014-02-12 NOTE — ED Notes (Signed)
Pt sleeping. 

## 2014-02-12 NOTE — ED Notes (Signed)
From home  Dc from [facility] this am from pantic attack,  Wanted to be brought to ed for same,  Pt screaming on arrival,   At present pt sleeping

## 2014-02-12 NOTE — ED Notes (Signed)
Took two EMTs to Place her onto bed

## 2014-02-12 NOTE — ED Notes (Addendum)
Pt had panic attack and called neighbor to call EMS.  Pt dc'd from [facility] today after admission for anxiety. EMS stated that family did not want pt brought to ED but pt insisted.  Stated she started screaming and would not stop until they brought her. Pt will not answer any questions in triage.

## 2014-02-12 NOTE — Discharge Instructions (Signed)
Panic Attacks  Panic attacks are sudden, short-lived surges of severe anxiety, fear, or discomfort. They may occur for no reason when you are relaxed, when you are anxious, or when you are sleeping. Panic attacks may occur for a number of reasons:   · Healthy people occasionally have panic attacks in extreme, life-threatening situations, such as war or natural disasters. Normal anxiety is a protective mechanism of the body that helps us react to danger (fight or flight response).  · Panic attacks are often seen with anxiety disorders, such as panic disorder, social anxiety disorder, generalized anxiety disorder, and phobias. Anxiety disorders cause excessive or uncontrollable anxiety. They may interfere with your relationships or other life activities.  · Panic attacks are sometimes seen with other mental illnesses such as depression and posttraumatic stress disorder.  · Certain medical conditions, prescription medicines, and drugs of abuse can cause panic attacks.  SYMPTOMS   Panic attacks start suddenly, peak within 20 minutes, and are accompanied by four or more of the following symptoms:  · Pounding heart or fast heart rate (palpitations).  · Sweating.  · Trembling or shaking.  · Shortness of breath or feeling smothered.  · Feeling choked.  · Chest pain or discomfort.  · Nausea or strange feeling in your stomach.  · Dizziness, lightheadedness, or feeling like you will faint.  · Chills or hot flushes.  · Numbness or tingling in your lips or hands and feet.  · Feeling that things are not real or feeling that you are not yourself.  · Fear of losing control or going crazy.  · Fear of dying.  Some of these symptoms can mimic serious medical conditions. For example, you may think you are having a heart attack. Although panic attacks can be very scary, they are not life threatening.  DIAGNOSIS   Panic attacks are diagnosed through an assessment by your health care provider. Your health care provider will ask questions  about your symptoms, such as where and when they occurred. Your health care provider will also ask about your medical history and use of alcohol and drugs, including prescription medicines. Your health care provider may order blood tests or other studies to rule out a serious medical condition. Your health care provider may refer you to a mental health professional for further evaluation.  TREATMENT   · Most healthy people who have one or two panic attacks in an extreme, life-threatening situation will not require treatment.  · The treatment for panic attacks associated with anxiety disorders or other mental illness typically involves counseling with a mental health professional, medicine, or a combination of both. Your health care provider will help determine what treatment is best for you.  · Panic attacks due to physical illness usually goes away with treatment of the illness. If prescription medicine is causing panic attacks, talk with your health care provider about stopping the medicine, decreasing the dose, or substituting another medicine.  · Panic attacks due to alcohol or drug abuse goes away with abstinence. Some adults need professional help in order to stop drinking or using drugs.  HOME CARE INSTRUCTIONS   · Take all your medicines as prescribed.    · Check with your health care provider before starting new prescription or over-the-counter medicines.  · Keep all follow up appointments with your health care provider.  SEEK MEDICAL CARE IF:  · You are not able to take your medicines as prescribed.  · Your symptoms do not improve or get worse.  SEEK IMMEDIATE   MEDICAL CARE IF:   · You experience panic attack symptoms that are different than your usual symptoms.  · You have serious thoughts about hurting yourself or others.  · You are taking medicine for panic attacks and have a serious side effect.  MAKE SURE YOU:  · Understand these instructions.  · Will watch your condition.  · Will get help right away  if you are not doing well or get worse.  Document Released: 09/04/2005 Document Revised: 06/25/2013 Document Reviewed: 04/18/2013  [employer]® Patient Information ©2014 [employer].

## 2014-02-12 NOTE — ED Provider Notes (Signed)
CSN: [id number]     Arrival date & time 02/12/14  2030 History  This chart was scribed for Delailah Spieth Smitty Cords, MD by Quintella Reichert, ED scribe.  This patient was seen in room MH10/MH10 and the patient's care was started at 11:47 PM.   Chief Complaint  Patient presents with  . Panic Attack    Patient is a 58 y.o. female presenting with anxiety. The history is provided by the patient. The history is limited by the condition of the patient (refusal to comply). No language interpreter was used.  Anxiety This is a new problem. The current episode started 6 to 12 hours ago. The problem occurs constantly. The problem has not changed since onset.Nothing aggravates the symptoms. Nothing relieves the symptoms. She has tried nothing for the symptoms. The treatment provided significant relief.    Level 5 Caveat: Pt not speaking  HPI Comments: Elizabeth Love is a 58 y.o. female with h/o stroke brought in by EMS to the Emergency Department for a possible panic attack that occurred earlier tonight.  Per triage note, pt was discharged from [facility] today after being seen in the ED for anxiety and diagnosed with chronic pain and given home health assistance by social work.  Later this evening pt had a panic attack and called a neighbor to call EMS.  EMS states that family did not want pt to be brought to the ED but she insisted.  Per EMS, she began screaming and would not stop until they brought her here.  Pt has not been answering any questions since arrival to ED.  And is sleeping soundly but easily arouses to verbal stimuli   Past Medical History  Diagnosis Date  . Seizures   . Hypertension   . Stroke     2007 - R sided weakness and expressive aphasia with h/o behavioral problems related to this  . Asthma   . Diabetes mellitus   . Chronic systolic CHF (congestive heart failure)   . Atrial fibrillation     a. On Xarelto after having DVT (prev felt to be poor anticoag cand 2/2 noncompliance).   . History of DVT (deep vein thrombosis)     a. 06/2012: RLE DVT, + protein C deficiency, placed on Xarelto. s/p IVC filter  . Noncompliance     Due to mental status, family has had to coax her to take medicines  . Hypothyroidism   . VT (ventricular tachycardia)     a. h/o ICD ~2007-2008. b. Adm 08/2012 with UTI, had VT/ICD shock x 5 (hypokalemic);  c. 06/2013 s/p ICD Gen change: SJM DC ICD ser #: [id number]  . Protein C deficiency   . NICM (nonischemic cardiomyopathy)     a. s/p AICD ~2007 (St. Jude) - previous care [place]. No known hx CAD. b. EF 10% by echo 04/2012;  c. 06/2013 s/p ICD Gen change: SJM DC ICD ser #: [id number]  . 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 Cholecystectomy    . Appendectomy    . Pacemaker insertion      [employer]  . Vena cava filter placement  07/19/2012    Procedure: INSERTION VENA-CAVA FILTER;  Surgeon: Larina Earthly, MD;  Location: [facility] OR;  Service: Vascular;  Laterality: [name];  . Insert / replace / remove pacemaker      ICD. [employer]  . Abdominal hysterectomy      Family  History  Problem Relation Age of Onset  . Hypertension Mother   . Coronary artery disease Neg Hx     History  Substance Use Topics  . Smoking status: Former Games developermoker  . Smokeless tobacco: Never Used  . Alcohol Use: No    OB History   Grav Para Term Preterm Abortions TAB SAB Ect Mult Living                   Review of Systems  Unable to perform ROS: Other      Allergies  Penicillins and Sulfa antibiotics  Home Medications   Prior to Admission medications   Medication Sig Start Date End Date Taking? Authorizing Provider  acetaminophen (TYLENOL) 325 MG tablet Take 650 mg by mouth every 6 (six) hours as needed for pain.    Historical Provider, MD  albuterol (PROVENTIL) (2.5 MG/3ML) 0.083% nebulizer solution Take 2.5 mg by nebulization every 6 (six) hours as needed. For cough or wheeze    Historical Provider, MD   ALPRAZolam Prudy Feeler(XANAX) 0.5 MG tablet Take 0.5 mg by mouth at bedtime as needed for sleep.    Historical Provider, MD  ALPRAZolam Prudy Feeler(XANAX) 1 MG tablet Take 1 tablet (1 mg total) by mouth at bedtime as needed for anxiety. 01/10/14   Rolland PorterMark James, MD  amiodarone (PACERONE) 200 MG tablet Take 1 tablet daily except none on weekend confirmed with family member 11/24/2013 11/24/13   Duke SalviaSteven C Klein, MD  digoxin (LANOXIN) 0.125 MG tablet Take 1 tablet (0.125 mg total) by mouth daily. 09/01/13   Marinda ElkAbraham Feliz Ortiz, MD  escitalopram (LEXAPRO) 20 MG tablet Take 1 tablet (20 mg total) by mouth daily. 09/01/13   Marinda ElkAbraham Feliz Ortiz, MD  furosemide (LASIX) 40 MG tablet Take 40 mg by mouth 2 (two) times daily.    Historical Provider, MD  insulin lispro (HUMALOG) 100 UNIT/ML injection Inject into the skin 3 (three) times daily before meals. On sliding scale    Historical Provider, MD  levETIRAcetam (KEPPRA) 1000 MG tablet Take 2,000 mg by mouth 2 (two) times daily.    Historical Provider, MD  levothyroxine (SYNTHROID, LEVOTHROID) 50 MCG tablet Take 50 mcg by mouth daily before breakfast.    Historical Provider, MD  lisinopril (PRINIVIL,ZESTRIL) 5 MG tablet Take 1 tablet (5 mg total) by mouth daily. 12/17/13   Amy D Clegg, NP  metolazone (ZAROXOLYN) 2.5 MG tablet TAKE 1 TABLET BY MOUTH AS NEEDED. FOR FLUID RETENTION 12/25/13   Dolores Pattyaniel R [name], MD  metoprolol tartrate (LOPRESSOR) 25 MG tablet Take 1 tablet (25 mg total) by mouth 2 (two) times daily. 09/01/13   Marinda ElkAbraham Feliz Ortiz, MD  omeprazole (PRILOSEC) 20 MG capsule Take 20 mg by mouth 2 (two) times daily.    Historical Provider, MD  oxyCODONE-acetaminophen (PERCOCET) 10-325 MG per tablet Take 1 tablet by mouth every 8 (eight) hours as needed for pain.    Historical Provider, MD  polyethylene glycol (MIRALAX / GLYCOLAX) packet Take 17 g by mouth daily as needed. For constipation    Historical Provider, MD  potassium chloride 20 MEQ/15ML (10%) solution Take 15 mLs (20 mEq  total) by mouth daily. 09/01/13   Marinda ElkAbraham Feliz Ortiz, MD  QUEtiapine Fumarate (SEROQUEL XR) 150 MG 24 hr tablet Take 1 tablet (150 mg total) by mouth at bedtime. 09/01/13   Marinda ElkAbraham Feliz Ortiz, MD  Rivaroxaban (XARELTO) 20 MG TABS tablet Take 20 mg by mouth daily.    Historical Provider, MD  spironolactone (ALDACTONE) 25 MG tablet  Take 25 mg by mouth daily.    Historical Provider, MD  Vitamin D, Ergocalciferol, (DRISDOL) 50000 UNITS CAPS capsule Take 50,000 Units by mouth every 30 (thirty) days.    Historical Provider, MD   BP 107/70  Pulse 79  Temp(Src) 98.5 F (36.9 C) (Oral)  Resp 20  Ht 5\' 5"  (1.651 m)  SpO2 100%   Physical Exam  Nursing note and vitals reviewed. Constitutional: She appears well-developed and well-nourished. No distress.  HENT:  Head: Normocephalic and atraumatic.  Mouth/Throat: Oropharynx is clear and moist.  Eyes: EOM are normal. Pupils are equal, round, and reactive to light.  Neck: Neck supple. No tracheal deviation present.  Cardiovascular: Normal rate, regular rhythm and intact distal pulses.   Pulmonary/Chest: Effort normal and breath sounds normal. No respiratory distress. She has no wheezes. She has no rales.  Abdominal: Soft. Bowel sounds are normal. There is no tenderness. There is no rebound and no guarding.  Musculoskeletal: Normal range of motion. She exhibits no edema.  Neurological: She is alert. She has normal reflexes. She exhibits normal muscle tone.  Skin: Skin is warm and dry. She is not diaphoretic.  Psychiatric:  Goes to sleep and swats at provider     ED Course  Procedures (including critical care time)  DIAGNOSTIC STUDIES: Oxygen Saturation is 100% on room air, normal by my interpretation.    COORDINATION OF CARE: 11:49 PM-Discussed treatment plan with pt at bedside and pt agreed to plan.    Labs Review Labs Reviewed - No data to display   Imaging Review Dg Chest 2 View  02/11/2014   CLINICAL DATA:  Chest pain.  EXAM:  CHEST  2 VIEW  COMPARISON:  Chest radiograph performed 01/10/2014  FINDINGS: The lungs are well-aerated. Vascular congestion is noted. Increased interstitial markings raise concern for mild pulmonary edema. Right basilar airspace opacity is seen. There is no evidence of pleural effusion or pneumothorax.  The heart is enlarged. A pacemaker/AICD is noted at the left chest wall, with leads ending at the right atrium and right ventricle. No acute osseous abnormalities are seen.  IMPRESSION: Mild right basilar airspace opacity may reflect atelectasis. Vascular congestion and cardiomegaly noted. Increased interstitial markings could reflect mild interstitial edema.   Electronically Signed   By: Roanna Raider M.D.   On: 02/11/2014 03:48   Dg Ankle Complete Right  02/11/2014   CLINICAL DATA:  Pain.  EXAM: RIGHT FOOT COMPLETE - 3+ VIEW; RIGHT ANKLE - COMPLETE 3+ VIEW  COMPARISON:  Right ankle radiograph November 28, 2012.  FINDINGS: No fracture deformity nor dislocation. The ankle mortise appears congruent and the tibiofibular syndesmosis intact. No destructive bony lesions. Patient is osteopenic. Moderate first metatarsophalangeal osteoarthrosis. Type 2 navicular bone. Soft tissue planes are non-suspicious.  IMPRESSION: No acute fracture deformity dislocation.  Moderate first metatarsophalangeal osteoarthrosis.  Osteopenia, which decreases sensitivity for acute nondisplaced fractures.   Electronically Signed   By: Awilda Metro   On: 02/11/2014 03:48   Dg Foot Complete Left  02/11/2014   CLINICAL DATA:  Bilateral foot pain.  EXAM: LEFT FOOT - COMPLETE 3+ VIEW  COMPARISON:  None.  FINDINGS: There is no evidence of fracture or dislocation. The joint spaces are preserved. There is no evidence of talar subluxation; the subtalar joint is unremarkable in appearance. Os naviculare fragments are noted.  No significant soft tissue abnormalities are seen.  IMPRESSION: 1. No evidence of fracture or dislocation. 2. Os  naviculare noted.   Electronically Signed   By:  Roanna Raider M.D.   On: 02/11/2014 03:49   Dg Foot Complete Right  02/11/2014   CLINICAL DATA:  Pain.  EXAM: RIGHT FOOT COMPLETE - 3+ VIEW; RIGHT ANKLE - COMPLETE 3+ VIEW  COMPARISON:  Right ankle radiograph November 28, 2012.  FINDINGS: No fracture deformity nor dislocation. The ankle mortise appears congruent and the tibiofibular syndesmosis intact. No destructive bony lesions. Patient is osteopenic. Moderate first metatarsophalangeal osteoarthrosis. Type 2 navicular bone. Soft tissue planes are non-suspicious.  IMPRESSION: No acute fracture deformity dislocation.  Moderate first metatarsophalangeal osteoarthrosis.  Osteopenia, which decreases sensitivity for acute nondisplaced fractures.   Electronically Signed   By: Awilda Metro   On: 02/11/2014 03:48     EKG Interpretation None      MDM   Final diagnoses:  Panic attack    Patient was reportedly screaming at home for unknown reason but now will not say anything and is sound asleep in the room.  Patient swats at EDP with attempt to perform exam.  Exam and vitals benign and reassuring.  Do not see need for testing was just seen at [facility] for chronic pain issues.  Patient should follow up with her PMD for ongoing care.     I personally performed the services described in this documentation, which was scribed in my presence. The recorded information has been reviewed and is accurate.    Jasmine Awe, MD 02/13/14 [phone]

## 2014-02-13 ENCOUNTER — Encounter (HOSPITAL_BASED_OUTPATIENT_CLINIC_OR_DEPARTMENT_OTHER): Payer: Self-pay | Admitting: Emergency Medicine

## 2014-02-25 ENCOUNTER — Telehealth: Payer: Self-pay | Admitting: Family

## 2014-02-25 NOTE — Telephone Encounter (Signed)
Please contact pt to arrange a follow up visit.  

## 2014-02-25 NOTE — Telephone Encounter (Signed)
Appointment scheduled for 02/27/14 °

## 2014-02-27 ENCOUNTER — Ambulatory Visit: Payer: Self-pay | Admitting: Family

## 2014-03-21 ENCOUNTER — Emergency Department (HOSPITAL_BASED_OUTPATIENT_CLINIC_OR_DEPARTMENT_OTHER): Payer: Medicare Other

## 2014-03-21 ENCOUNTER — Observation Stay (HOSPITAL_BASED_OUTPATIENT_CLINIC_OR_DEPARTMENT_OTHER)
Admission: EM | Admit: 2014-03-21 | Discharge: 2014-03-24 | Disposition: A | Discharge status: Home / Self Care | Payer: Medicare Other | Attending: Internal Medicine | Admitting: Internal Medicine

## 2014-03-21 ENCOUNTER — Encounter (HOSPITAL_BASED_OUTPATIENT_CLINIC_OR_DEPARTMENT_OTHER): Payer: Self-pay | Admitting: Emergency Medicine

## 2014-03-21 ENCOUNTER — Other Ambulatory Visit: Payer: Self-pay

## 2014-03-21 ENCOUNTER — Observation Stay (HOSPITAL_BASED_OUTPATIENT_CLINIC_OR_DEPARTMENT_OTHER): Payer: Medicare Other

## 2014-03-21 ENCOUNTER — Inpatient Hospital Stay (HOSPITAL_COMMUNITY): Payer: Medicare Other

## 2014-03-21 DIAGNOSIS — F1110 Opioid abuse, uncomplicated: Secondary | ICD-10-CM

## 2014-03-21 DIAGNOSIS — F419 Anxiety disorder, unspecified: Secondary | ICD-10-CM

## 2014-03-21 DIAGNOSIS — Z95810 Presence of automatic (implantable) cardiac defibrillator: Secondary | ICD-10-CM

## 2014-03-21 DIAGNOSIS — Z9119 Patient's noncompliance with other medical treatment and regimen: Secondary | ICD-10-CM

## 2014-03-21 DIAGNOSIS — I5041 Acute combined systolic (congestive) and diastolic (congestive) heart failure: Secondary | ICD-10-CM

## 2014-03-21 DIAGNOSIS — I059 Rheumatic mitral valve disease, unspecified: Secondary | ICD-10-CM

## 2014-03-21 DIAGNOSIS — I4891 Unspecified atrial fibrillation: Secondary | ICD-10-CM

## 2014-03-21 DIAGNOSIS — I5023 Acute on chronic systolic (congestive) heart failure: Secondary | ICD-10-CM

## 2014-03-21 DIAGNOSIS — I10 Essential (primary) hypertension: Secondary | ICD-10-CM | POA: Insufficient documentation

## 2014-03-21 DIAGNOSIS — I482 Chronic atrial fibrillation, unspecified: Secondary | ICD-10-CM

## 2014-03-21 DIAGNOSIS — R109 Unspecified abdominal pain: Secondary | ICD-10-CM | POA: Diagnosis present

## 2014-03-21 DIAGNOSIS — D696 Thrombocytopenia, unspecified: Secondary | ICD-10-CM

## 2014-03-21 DIAGNOSIS — F411 Generalized anxiety disorder: Secondary | ICD-10-CM | POA: Diagnosis not present

## 2014-03-21 DIAGNOSIS — J9600 Acute respiratory failure, unspecified whether with hypoxia or hypercapnia: Secondary | ICD-10-CM | POA: Diagnosis not present

## 2014-03-21 DIAGNOSIS — D649 Anemia, unspecified: Secondary | ICD-10-CM

## 2014-03-21 DIAGNOSIS — J9621 Acute and chronic respiratory failure with hypoxia: Secondary | ICD-10-CM

## 2014-03-21 DIAGNOSIS — I2510 Atherosclerotic heart disease of native coronary artery without angina pectoris: Secondary | ICD-10-CM | POA: Insufficient documentation

## 2014-03-21 DIAGNOSIS — R569 Unspecified convulsions: Secondary | ICD-10-CM

## 2014-03-21 DIAGNOSIS — G40909 Epilepsy, unspecified, not intractable, without status epilepticus: Secondary | ICD-10-CM

## 2014-03-21 DIAGNOSIS — I6930 Unspecified sequelae of cerebral infarction: Secondary | ICD-10-CM

## 2014-03-21 DIAGNOSIS — Z86718 Personal history of other venous thrombosis and embolism: Secondary | ICD-10-CM

## 2014-03-21 DIAGNOSIS — G8929 Other chronic pain: Secondary | ICD-10-CM

## 2014-03-21 DIAGNOSIS — I428 Other cardiomyopathies: Secondary | ICD-10-CM

## 2014-03-21 DIAGNOSIS — I5022 Chronic systolic (congestive) heart failure: Secondary | ICD-10-CM

## 2014-03-21 DIAGNOSIS — E119 Type 2 diabetes mellitus without complications: Secondary | ICD-10-CM | POA: Diagnosis present

## 2014-03-21 DIAGNOSIS — G9340 Encephalopathy, unspecified: Secondary | ICD-10-CM | POA: Diagnosis present

## 2014-03-21 DIAGNOSIS — D6859 Other primary thrombophilia: Secondary | ICD-10-CM

## 2014-03-21 DIAGNOSIS — I472 Ventricular tachycardia, unspecified: Secondary | ICD-10-CM

## 2014-03-21 DIAGNOSIS — J45909 Unspecified asthma, uncomplicated: Secondary | ICD-10-CM | POA: Diagnosis present

## 2014-03-21 DIAGNOSIS — I69320 Aphasia following cerebral infarction: Secondary | ICD-10-CM

## 2014-03-21 DIAGNOSIS — D509 Iron deficiency anemia, unspecified: Secondary | ICD-10-CM

## 2014-03-21 DIAGNOSIS — J9620 Acute and chronic respiratory failure, unspecified whether with hypoxia or hypercapnia: Secondary | ICD-10-CM

## 2014-03-21 DIAGNOSIS — Z91199 Patient's noncompliance with other medical treatment and regimen due to unspecified reason: Secondary | ICD-10-CM

## 2014-03-21 DIAGNOSIS — N289 Disorder of kidney and ureter, unspecified: Secondary | ICD-10-CM

## 2014-03-21 DIAGNOSIS — E876 Hypokalemia: Secondary | ICD-10-CM

## 2014-03-21 HISTORY — DX: Panic disorder (episodic paroxysmal anxiety): F41.0

## 2014-03-21 LAB — RAPID URINE DRUG SCREEN, HOSP PERFORMED
Amphetamines: NOT DETECTED
Barbiturates: NOT DETECTED
Benzodiazepines: NOT DETECTED
Cocaine: NOT DETECTED
OPIATES: NOT DETECTED
Tetrahydrocannabinol: NOT DETECTED

## 2014-03-21 LAB — URINE MICROSCOPIC-ADD ON

## 2014-03-21 LAB — CBC WITH DIFFERENTIAL/PLATELET
BASOS PCT: 0 % (ref 0–1)
Basophils Absolute: 0 10*3/uL (ref 0.0–0.1)
EOS ABS: 0 10*3/uL (ref 0.0–0.7)
Eosinophils Relative: 0 % (ref 0–5)
HCT: 27.3 % — ABNORMAL LOW (ref 36.0–46.0)
HEMOGLOBIN: 7.8 g/dL — AB (ref 12.0–15.0)
Lymphocytes Relative: 13 % (ref 12–46)
Lymphs Abs: 0.8 10*3/uL (ref 0.7–4.0)
MCH: 21.1 pg — ABNORMAL LOW (ref 26.0–34.0)
MCHC: 28.6 g/dL — AB (ref 30.0–36.0)
MCV: 74 fL — ABNORMAL LOW (ref 78.0–100.0)
Monocytes Absolute: 0.7 10*3/uL (ref 0.1–1.0)
Monocytes Relative: 11 % (ref 3–12)
NEUTROS PCT: 76 % (ref 43–77)
Neutro Abs: 4.7 10*3/uL (ref 1.7–7.7)
PLATELETS: 239 10*3/uL (ref 150–400)
RBC: 3.69 MIL/uL — ABNORMAL LOW (ref 3.87–5.11)
RDW: 21 % — ABNORMAL HIGH (ref 11.5–15.5)
WBC: 6.2 10*3/uL (ref 4.0–10.5)

## 2014-03-21 LAB — ABO/RH: ABO/RH(D): A POS

## 2014-03-21 LAB — TROPONIN I
Troponin I: <0.3 ng/mL (ref <0.30)
Troponin I: <0.3 ng/mL (ref <0.30)
Troponin I: <0.3 ng/mL (ref <0.30)

## 2014-03-21 LAB — HEMOGLOBIN A1C
Hgb A1c MFr Bld: 8.2 % — ABNORMAL HIGH (ref <5.7)
MEAN PLASMA GLUCOSE: 189 mg/dL — AB (ref <117)

## 2014-03-21 LAB — MRSA PCR SCREENING: MRSA by PCR: NEGATIVE

## 2014-03-21 LAB — BLOOD GAS, ARTERIAL
Acid-base deficit: 4.5 mmol/L — ABNORMAL HIGH (ref 0.0–2.0)
Bicarbonate: 19.4 mEq/L — ABNORMAL LOW (ref 20.0–24.0)
Drawn by: 24686
O2 CONTENT: 4 L/min
O2 SAT: 98.2 %
PH ART: 7.408 (ref 7.350–7.450)
Patient temperature: 98.6
TCO2: 20.3 mmol/L (ref 0–100)
pCO2 arterial: 31.4 mmHg — ABNORMAL LOW (ref 35.0–45.0)
pO2, Arterial: 112 mmHg — ABNORMAL HIGH (ref 80.0–100.0)

## 2014-03-21 LAB — AMMONIA: AMMONIA: 24 umol/L (ref 11–60)

## 2014-03-21 LAB — COMPREHENSIVE METABOLIC PANEL
ALBUMIN: 3.1 g/dL — AB (ref 3.5–5.2)
ALT: 10 U/L (ref 0–35)
ANION GAP: 15 (ref 5–15)
AST: 24 U/L (ref 0–37)
Alkaline Phosphatase: 67 U/L (ref 39–117)
BILIRUBIN TOTAL: 1.5 mg/dL — AB (ref 0.3–1.2)
BUN: 19 mg/dL (ref 6–23)
CALCIUM: 9.8 mg/dL (ref 8.4–10.5)
CHLORIDE: 103 meq/L (ref 96–112)
CO2: 20 mEq/L (ref 19–32)
CREATININE: 1 mg/dL (ref 0.50–1.10)
GFR calc Af Amer: 72 mL/min — ABNORMAL LOW (ref >90)
GFR calc non Af Amer: 62 mL/min — ABNORMAL LOW (ref >90)
Glucose, Bld: 224 mg/dL — ABNORMAL HIGH (ref 70–99)
Potassium: 4.1 mEq/L (ref 3.7–5.3)
Sodium: 138 mEq/L (ref 137–147)
Total Protein: 8.2 g/dL (ref 6.0–8.3)

## 2014-03-21 LAB — IRON AND TIBC
Iron: 16 ug/dL — ABNORMAL LOW (ref 42–135)
SATURATION RATIOS: 3 % — AB (ref 20–55)
TIBC: 562 ug/dL — ABNORMAL HIGH (ref 250–470)
UIBC: 546 ug/dL — ABNORMAL HIGH (ref 125–400)

## 2014-03-21 LAB — HEMOGLOBIN AND HEMATOCRIT, BLOOD
HEMATOCRIT: 29.3 % — AB (ref 36.0–46.0)
HEMATOCRIT: 30.7 % — AB (ref 36.0–46.0)
HEMOGLOBIN: 8.1 g/dL — AB (ref 12.0–15.0)
Hemoglobin: 9.1 g/dL — ABNORMAL LOW (ref 12.0–15.0)

## 2014-03-21 LAB — RETICULOCYTES
RBC.: 3.84 MIL/uL — ABNORMAL LOW (ref 3.87–5.11)
RETIC CT PCT: 2.1 % (ref 0.4–3.1)
Retic Count, Absolute: 80.6 10*3/uL (ref 19.0–186.0)

## 2014-03-21 LAB — TSH: TSH: 1.85 u[IU]/mL (ref 0.350–4.500)

## 2014-03-21 LAB — APTT: APTT: 35 s (ref 24–37)

## 2014-03-21 LAB — URINALYSIS, ROUTINE W REFLEX MICROSCOPIC
GLUCOSE, UA: NEGATIVE mg/dL
Hgb urine dipstick: NEGATIVE
KETONES UR: NEGATIVE mg/dL
LEUKOCYTES UA: NEGATIVE
NITRITE: NEGATIVE
Protein, ur: 100 mg/dL — AB
Specific Gravity, Urine: 1.028 (ref 1.005–1.030)
Urobilinogen, UA: 1 mg/dL (ref 0.0–1.0)
pH: 5 (ref 5.0–8.0)

## 2014-03-21 LAB — FERRITIN: FERRITIN: 26 ng/mL (ref 10–291)

## 2014-03-21 LAB — PREPARE RBC (CROSSMATCH)

## 2014-03-21 LAB — FOLATE: Folate: 11.9 ng/mL

## 2014-03-21 LAB — PROTIME-INR
INR: 2.5 — AB (ref 0.00–1.49)
PROTHROMBIN TIME: 27 s — AB (ref 11.6–15.2)

## 2014-03-21 LAB — LIPASE, BLOOD: LIPASE: 26 U/L (ref 11–59)

## 2014-03-21 LAB — PRO B NATRIURETIC PEPTIDE: Pro B Natriuretic peptide (BNP): 11633 pg/mL — ABNORMAL HIGH (ref 0–125)

## 2014-03-21 LAB — VITAMIN B12: Vitamin B-12: 829 pg/mL (ref 211–911)

## 2014-03-21 MED ORDER — ENOXAPARIN SODIUM 40 MG/0.4ML ~~LOC~~ SOLN: 40.0000 mg | SUBCUTANEOUS | Status: DC

## 2014-03-21 MED ORDER — FUROSEMIDE 10 MG/ML IJ SOLN
40.0000 mg | Freq: Once | INTRAMUSCULAR | Status: AC
  Administered 2014-03-21: 40 mg via INTRAVENOUS
  Filled 2014-03-21: qty 4

## 2014-03-21 MED ORDER — LORAZEPAM 1 MG PO TABS
1.0000 mg | ORAL_TABLET | Freq: Once | ORAL | Status: AC
  Administered 2014-03-21: 1 mg via ORAL
  Filled 2014-03-21: qty 1

## 2014-03-21 MED ORDER — IOHEXOL 300 MG/ML  SOLN: 100.0000 mL | Freq: Once | INTRAMUSCULAR | Status: AC | PRN

## 2014-03-21 MED ORDER — FUROSEMIDE 10 MG/ML IJ SOLN
20.0000 mg | Freq: Once | INTRAMUSCULAR | Status: AC
  Administered 2014-03-21: 20 mg via INTRAVENOUS

## 2014-03-21 MED ORDER — FUROSEMIDE 10 MG/ML IJ SOLN
40.0000 mg | Freq: Two times a day (BID) | INTRAMUSCULAR | Status: DC
  Administered 2014-03-21 – 2014-03-22 (×4): 40 mg via INTRAVENOUS
  Filled 2014-03-21 (×7): qty 4

## 2014-03-21 MED ORDER — TRAMADOL HCL 50 MG PO TABS
50.0000 mg | ORAL_TABLET | Freq: Once | ORAL | Status: AC
  Administered 2014-03-21: 50 mg via ORAL
  Filled 2014-03-21: qty 1

## 2014-03-21 MED ORDER — LEVOTHYROXINE SODIUM 100 MCG IV SOLR
25.0000 ug | Freq: Every day | INTRAVENOUS | Status: DC
  Administered 2014-03-21: 11:00:00 via INTRAVENOUS
  Filled 2014-03-21 (×2): qty 5

## 2014-03-21 MED ORDER — ENOXAPARIN SODIUM 40 MG/0.4ML ~~LOC~~ SOLN
40.0000 mg | SUBCUTANEOUS | Status: DC
  Filled 2014-03-21: qty 0.4

## 2014-03-21 MED ORDER — SODIUM CHLORIDE 0.9 % IV SOLN: 250.0000 mL | INTRAVENOUS | Status: DC | PRN

## 2014-03-21 MED ORDER — ACETAMINOPHEN 650 MG RE SUPP: 650.0000 mg | Freq: Four times a day (QID) | RECTAL | Status: DC | PRN

## 2014-03-21 MED ORDER — SODIUM CHLORIDE 0.9 % IV SOLN
250.0000 mL | INTRAVENOUS | Status: DC | PRN
  Administered 2014-03-21: 10 mL/h via INTRAVENOUS

## 2014-03-21 MED ORDER — ZIPRASIDONE MESYLATE 20 MG IM SOLR
10.0000 mg | Freq: Once | INTRAMUSCULAR | Status: AC
  Administered 2014-03-21: 10 mg via INTRAMUSCULAR
  Filled 2014-03-21: qty 20

## 2014-03-21 MED ORDER — ALBUTEROL SULFATE (2.5 MG/3ML) 0.083% IN NEBU: 2.5000 mg | INHALATION_SOLUTION | Freq: Four times a day (QID) | RESPIRATORY_TRACT | Status: DC | PRN

## 2014-03-21 MED ORDER — LORAZEPAM 2 MG/ML IJ SOLN
1.0000 mg | Freq: Four times a day (QID) | INTRAMUSCULAR | Status: DC | PRN
  Administered 2014-03-22: 1 mg via INTRAVENOUS
  Filled 2014-03-21 (×2): qty 1

## 2014-03-21 MED ORDER — SODIUM CHLORIDE 0.9 % IJ SOLN
3.0000 mL | Freq: Two times a day (BID) | INTRAMUSCULAR | Status: DC
  Administered 2014-03-21 – 2014-03-23 (×4): 3 mL via INTRAVENOUS

## 2014-03-21 MED ORDER — SODIUM CHLORIDE 0.9 % IJ SOLN: 3.0000 mL | INTRAMUSCULAR | Status: DC | PRN

## 2014-03-21 MED ORDER — ZIPRASIDONE MESYLATE 20 MG IM SOLR
INTRAMUSCULAR | Status: AC
  Administered 2014-03-21: 10 mg via INTRAMUSCULAR
  Filled 2014-03-21: qty 20

## 2014-03-21 MED ORDER — LORAZEPAM 2 MG/ML IJ SOLN
1.0000 mg | Freq: Once | INTRAMUSCULAR | Status: AC
  Administered 2014-03-21: 1 mg via INTRAVENOUS

## 2014-03-21 MED ORDER — IPRATROPIUM-ALBUTEROL 0.5-2.5 (3) MG/3ML IN SOLN
3.0000 mL | Freq: Four times a day (QID) | RESPIRATORY_TRACT | Status: DC
  Administered 2014-03-22: 3 mL via RESPIRATORY_TRACT
  Filled 2014-03-21: qty 3

## 2014-03-21 MED ORDER — ONDANSETRON HCL 4 MG/2ML IJ SOLN: 4.0000 mg | Freq: Four times a day (QID) | INTRAMUSCULAR | Status: DC | PRN

## 2014-03-21 MED ORDER — DEXAMETHASONE SODIUM PHOSPHATE 10 MG/ML IJ SOLN
10.0000 mg | Freq: Once | INTRAMUSCULAR | Status: AC
  Administered 2014-03-21: 10 mg via INTRAVENOUS
  Filled 2014-03-21: qty 1

## 2014-03-21 MED ORDER — LORAZEPAM 2 MG/ML IJ SOLN
INTRAMUSCULAR | Status: AC
  Filled 2014-03-21: qty 1

## 2014-03-21 MED ORDER — LEVETIRACETAM 500 MG/5ML IV SOLN
2000.0000 mg | Freq: Two times a day (BID) | INTRAVENOUS | Status: DC
  Administered 2014-03-21: 2000 mg via INTRAVENOUS
  Filled 2014-03-21 (×2): qty 20

## 2014-03-21 MED ORDER — ONDANSETRON HCL 4 MG PO TABS: 4.0000 mg | ORAL_TABLET | Freq: Four times a day (QID) | ORAL | Status: DC | PRN

## 2014-03-21 MED ORDER — PANTOPRAZOLE SODIUM 40 MG IV SOLR
40.0000 mg | Freq: Two times a day (BID) | INTRAVENOUS | Status: DC
  Administered 2014-03-21 – 2014-03-22 (×4): 40 mg via INTRAVENOUS
  Filled 2014-03-21 (×5): qty 40

## 2014-03-21 MED ORDER — LEVETIRACETAM 100 MG/ML PO SOLN
2000.0000 mg | Freq: Two times a day (BID) | ORAL | Status: DC
  Administered 2014-03-21 – 2014-03-24 (×6): 2000 mg via ORAL
  Filled 2014-03-21 (×8): qty 20

## 2014-03-21 MED ORDER — SODIUM CHLORIDE 0.9 % IV SOLN
2000.0000 mg | Freq: Two times a day (BID) | INTRAVENOUS | Status: AC
  Administered 2014-03-21: 2000 mg via INTRAVENOUS
  Filled 2014-03-21: qty 20

## 2014-03-21 MED ORDER — ZIPRASIDONE MESYLATE 20 MG IM SOLR
10.0000 mg | Freq: Once | INTRAMUSCULAR | Status: AC
  Administered 2014-03-21: 10 mg via INTRAMUSCULAR

## 2014-03-21 MED ORDER — ACETAMINOPHEN 325 MG PO TABS: 650.0000 mg | ORAL_TABLET | Freq: Four times a day (QID) | ORAL | Status: DC | PRN

## 2014-03-21 MED ORDER — IPRATROPIUM-ALBUTEROL 0.5-2.5 (3) MG/3ML IN SOLN
3.0000 mL | RESPIRATORY_TRACT | Status: DC
  Administered 2014-03-21 (×3): 3 mL via RESPIRATORY_TRACT
  Filled 2014-03-21 (×3): qty 3

## 2014-03-21 MED ORDER — SODIUM CHLORIDE 0.9 % IJ SOLN: 3.0000 mL | Freq: Two times a day (BID) | INTRAMUSCULAR | Status: DC

## 2014-03-21 NOTE — H&P (Addendum)
History and Physical       Hospital Admission Note Date: 03/21/2014  Patient name: Elizabeth Love Medical record number: [id number] Date of birth: 11/10/1955 Age: 58 y.o. Gender: female  PCP: Letitia LibraHodgin Jr, Ala Dachhomas Whitson, MD    Chief Complaint:  Lethargic, abdominal pain, behavioral problems  HPI: Patient is a 58 year old female with history of prior CVA in 2007, with residual right-sided hemiparesis and expressive aphasia, chronic systolic CHF, atrial fibrillation on anticoagulation, ICD, asthma, diabetes was [facility] for acute agitation, abdominal pain. Patient is unable to provide any history herself, lethargic due to multiple Ativan and Geodon she received at the ER prior to [facility].  History is based on on my conversation with patient's brother, Elizabeth Love on the phone [phone]), who reported that at baseline patient only says yes and now, is alert but not conversant due to her stroke. He reported that she was at her baseline yesterday, around midnight woke up screaming (and per the brother she does that usually due to her anxiety attacks) and was agitated. Patient had run out of her Xanax and had not been taking her medications including the Lasix. She has been having abdominal pain for last 3 days, headache which is chronic and she takes Percocet for that. Per her brother, she usually walks a little bit with a walker. She has significant behavioral problems including agitation after the stroke. At the time of my encounter, patient is lethargic and unable to provide any history although she points out that her abdomen for pain. Patient was wheezing, and was on BiPAP in med [facility], currently off. Patient has a history of seizures, but her brother reported that he did not notice any seizures last night. He also reported that patient lives with her family, including  him.  Review of Systems:  Unable to obtain from the patient due to her mental status   Past Medical History: Past Medical History  Diagnosis Date  . Seizures   . Hypertension   . Stroke     2007 - R sided weakness and expressive aphasia with h/o behavioral problems related to this  . Asthma   . Diabetes mellitus   . Chronic systolic CHF (congestive heart failure)   . Atrial fibrillation     a. On Xarelto after having DVT (prev felt to be poor anticoag cand 2/2 noncompliance).  . History of DVT (deep vein thrombosis)     a. 06/2012: RLE DVT, + protein C deficiency, placed on Xarelto. s/p IVC filter  . Noncompliance     Due to mental status, family has had to coax her to take medicines  . Hypothyroidism   . VT (ventricular tachycardia)     a. h/o ICD ~2007-2008. b. Adm 08/2012 with UTI, had VT/ICD shock x 5 (hypokalemic);  c. 06/2013 s/p ICD Gen change: SJM DC ICD ser #: [id number]  . Protein C deficiency   . NICM (nonischemic cardiomyopathy)     a. s/p AICD ~2007 ([employer]. Jude) - [place]. No known hx CAD. b. EF 10% by echo 04/2012;  c. 06/2013 s/p ICD Gen change: SJM DC ICD ser #: [id number]  . 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 Panic attacks    Past Surgical History  Procedure Laterality Date  . Cholecystectomy    . Appendectomy    . Pacemaker insertion      [employer]  .  Vena cava filter placement  07/19/2012    Procedure: INSERTION VENA-CAVA FILTER;  Surgeon: Larina Earthly, MD;  Location: [facility] OR;  Service: Vascular;  Laterality: [name];  . Insert / replace / remove pacemaker      ICD. [employer]  . Abdominal hysterectomy      Medications: Prior to Admission medications   Medication Sig Start Date End Date Taking? Authorizing Provider  acetaminophen (TYLENOL) 325 MG tablet Take 650 mg by mouth every 6 (six) hours as needed for pain.    Historical Provider, MD  albuterol (PROVENTIL) (2.5 MG/3ML) 0.083% nebulizer solution Take 2.5 mg by nebulization every 6  (six) hours as needed. For cough or wheeze    Historical Provider, MD  ALPRAZolam Prudy Feeler) 0.5 MG tablet Take 0.5 mg by mouth at bedtime as needed for sleep.    Historical Provider, MD  ALPRAZolam Prudy Feeler) 1 MG tablet Take 1 tablet (1 mg total) by mouth at bedtime as needed for anxiety. 01/10/14   Rolland Porter, MD  amiodarone (PACERONE) 200 MG tablet Take 1 tablet daily except none on weekend confirmed with family member 11/24/2013 11/24/13   Duke Salvia, MD  digoxin (LANOXIN) 0.125 MG tablet Take 1 tablet (0.125 mg total) by mouth daily. 09/01/13   Marinda Elk, MD  escitalopram (LEXAPRO) 20 MG tablet Take 1 tablet (20 mg total) by mouth daily. 09/01/13   Marinda Elk, MD  furosemide (LASIX) 40 MG tablet Take 40 mg by mouth 2 (two) times daily.    Historical Provider, MD  insulin lispro (HUMALOG) 100 UNIT/ML injection Inject into the skin 3 (three) times daily before meals. On sliding scale    Historical Provider, MD  levETIRAcetam (KEPPRA) 1000 MG tablet Take 2,000 mg by mouth 2 (two) times daily.    Historical Provider, MD  levothyroxine (SYNTHROID, LEVOTHROID) 50 MCG tablet Take 50 mcg by mouth daily before breakfast.    Historical Provider, MD  lisinopril (PRINIVIL,ZESTRIL) 5 MG tablet Take 1 tablet (5 mg total) by mouth daily. 12/17/13   Amy D Clegg, NP  metolazone (ZAROXOLYN) 2.5 MG tablet TAKE 1 TABLET BY MOUTH AS NEEDED. FOR FLUID RETENTION 12/25/13   Dolores Patty, MD  metoprolol tartrate (LOPRESSOR) 25 MG tablet Take 1 tablet (25 mg total) by mouth 2 (two) times daily. 09/01/13   Marinda Elk, MD  omeprazole (PRILOSEC) 20 MG capsule Take 20 mg by mouth 2 (two) times daily.    Historical Provider, MD  oxyCODONE-acetaminophen (PERCOCET) 10-325 MG per tablet Take 1 tablet by mouth every 8 (eight) hours as needed for pain.    Historical Provider, MD  polyethylene glycol (MIRALAX / GLYCOLAX) packet Take 17 g by mouth daily as needed. For constipation    Historical Provider, MD   potassium chloride 20 MEQ/15ML (10%) solution Take 15 mLs (20 mEq total) by mouth daily. 09/01/13   Marinda Elk, MD  QUEtiapine Fumarate (SEROQUEL XR) 150 MG 24 hr tablet Take 1 tablet (150 mg total) by mouth at bedtime. 09/01/13   Marinda Elk, MD  Rivaroxaban (XARELTO) 20 MG TABS tablet Take 20 mg by mouth daily.    Historical Provider, MD  spironolactone (ALDACTONE) 25 MG tablet Take 25 mg by mouth daily.    Historical Provider, MD  Vitamin D, Ergocalciferol, (DRISDOL) 50000 UNITS CAPS capsule Take 50,000 Units by mouth every 30 (thirty) days.    Historical Provider, MD    Allergies:   Allergies  Allergen Reactions  . Penicillins Rash  .  Sulfa Antibiotics Rash    Social History:  reports that she has quit smoking. She has never used smokeless tobacco. She reports that she does not drink alcohol or use illicit drugs.  Family History: Family History  Problem Relation Age of Onset  . Hypertension Mother   . Coronary artery disease Neg Hx     Physical Exam: Blood pressure 112/61, pulse 87, temperature 97.8 F (36.6 C), temperature source Oral, resp. rate 35, height 5\' 2"  (1.575 m), weight 52.164 kg (115 lb), SpO2 100.00%. General: Somnolent and lethargic, no nuchal rigidity HEENT: normocephalic, atraumatic, anicteric sclera, pink conjunctiva, pupils equal and reactive to light and accomodation, oropharynx clear Neck: supple, no masses or lymphadenopathy, no goiter, no bruits  Heart: Regular rate and rhythm, without murmurs, rubs or gallops. Lungs: Scattered wheezing bilaterally with decreased breath sounds Abdomen: Soft, nontender, nondistended, positive bowel sounds, no masses. Extremities: No clubbing, cyanosis or edema with positive pedal pulses. Neuro: Patient does not follow any verbal commands Psych: Somnolent and lethargic Skin: no rashes or lesions, warm and dry   LABS on Admission:  Basic Metabolic Panel:  Recent Labs Lab 03/21/14 0249  NA 138   K 4.1  CL 103  CO2 20  GLUCOSE 224*  BUN 19  CREATININE 1.00  CALCIUM 9.8   Liver Function Tests:  Recent Labs Lab 03/21/14 0249  AST 24  ALT 10  ALKPHOS 67  BILITOT 1.5*  PROT 8.2  ALBUMIN 3.1*    Recent Labs Lab 03/21/14 0249  LIPASE 26   No results found for this basename: AMMONIA,  in the last 168 hours CBC:  Recent Labs Lab 03/21/14 0249  WBC 6.2  NEUTROABS 4.7  HGB 7.8*  HCT 27.3*  MCV 74.0*  PLT 239   Cardiac Enzymes:  Recent Labs Lab 03/21/14 0249  TROPONINI <0.30   BNP: No components found with this basename: POCBNP,  CBG: No results found for this basename: GLUCAP,  in the last 168 hours   Radiological Exams on Admission: Dg Abd Acute W/chest  03/21/2014   CLINICAL DATA:  Abdominal pain for 3 days  EXAM: ACUTE ABDOMEN SERIES (ABDOMEN 2 VIEW & CHEST 1 VIEW)  COMPARISON:  02/11/2014 chest radiograph  FINDINGS: Dual-chamber left approach ICD/ pacer leads have unchanged orientation. There is chronic, marked cardiopericardial enlargement. There is bronchial cuffing, cephalized blood flow, and Kerley lines. The retrocardiac lung is obscured by the heart.  The bowel gas pattern is nonobstructed. Cholecystectomy clips are noted. The liver shadow is prominent, possibly hepatic congestion. No evidence for urolithiasis. No pneumoperitoneum.  IMPRESSION: 1. CHF. 2. Negative abdominal radiographs.   Electronically Signed   By: Tiburcio Pea M.D.   On: 03/21/2014 03:33    Assessment/Plan Principal Problem:   Acute encephalopathy: Differential diagnosis includes multiple sedating medications including Ativan, Geodon received prior to my encounter, TIA/CVA, metabolic encephalopathy, seizure. UA shows no UTI, - Admit to step down, obtain stat CT head, may need to pursue MRI if CT head is negative, due to prior history of stroke, EEG, serial neurochecks, seizure precautions (had run out off Xanax and prior history of seizures). - Obtain stat ABG, ammonia level,  urine drug screen, TSH - Neurology consulted, called Dr Thad Ranger - N.p.o. until alert and awake, monitor respiratory status closely, currently protecting airway - No haldol due to prolonged QTC  Active Problems:   Chronic pain - Hold off on any pain medications until alert and awake  Anemia with Abdominal pain: Unsure of chronicity of  the pain or if any GI bleed, no vomiting or hematemesis per brother, still possibility of GI bleed or retroperitoneal bleed as patient is on anticoagulation - Obtain stat CT abdomen and pelvis for further workup, stool occult test - Obtain anemia panel - Hemoglobin 7.8 (was 12.6 on 5/27), serial H&H, place on IV Protonix - Hold off on xarelto - GI consult as indicated  Acute on chronic combined systolic and diastolic CHF: Medically noncompliant, history of ICD, NICM - BNP 11,633, was given Lasix in ED - Placed on CHF pathway, daily weights, strict I.'s and O.'s, place on IV Lasix 40mg  IV q12hours - Obtain 2-D echocardiogram - Prior echo in 07/2013 head showed EF of 20% with diffuse hypokinesis    DM type 2 (diabetes mellitus, type 2) - Currently n.p.o., will place on sliding scale insulin    History of stroke with residual effects: With right-sided hemiparesis per patient's brother, no dysphagia - continue n.p.o., await CT head    Hypertension - Currently stable     Asthma with Acute on chronic respiratory failure - Currently off BiPAP, obtain ABG, placed on duo nebs    History of Seizures - Family had not noticed any seizure activity, placed on IV Keppra   Hypothyroidism - Obtain TSH, placed on IV Synthroid  DVT prophylaxis:  SCDs   CODE STATUS:  full CODE STATUS   Family Communication: Admission, patients condition and plan of care including tests being ordered have been discussed with the patient's brother, Elizabeth Love  who indicates understanding and agree with the plan and Code Status   Further plan will depend as patient's  clinical course evolves and further radiologic and laboratory data become available.   Time Spent on Admission:  1 hour   Sonyia Muro M.D. [facility] 03/21/2014, 11:01 AM Pager: [phone]  If 7PM-7AM, please contact night-coverage [email or web address] Password [email or web address]  **Disclaimer: This note was dictated with voice recognition software. Similar sounding words can inadvertently be transcribed and this note may contain transcription errors which may not have been corrected upon publication of note.**

## 2014-03-21 NOTE — Progress Notes (Signed)
Echocardiogram 2D Echocardiogram has been performed.  Elizabeth Love 03/21/2014, 2:18 PM

## 2014-03-21 NOTE — ED Notes (Signed)
Pt yelling, sitting on bed rocking back and forth, pt continues to refuse to give urine sample or allow an i/o cath. Explained poc to patient and she agreed at this time

## 2014-03-21 NOTE — ED Provider Notes (Addendum)
CSN: [id number]     Arrival date & time 03/21/14  0157 History   First MD Initiated Contact with Patient 03/21/14 0204     Chief Complaint  Patient presents with  . Abdominal Pain     (Consider location/radiation/quality/duration/timing/severity/associated sxs/prior Treatment) HPI Patient is a very difficult historian due to CVA with right-sided weakness and expressive aphasia. Patient also has history of behavioral problems related to this. Per EMS patient is complaining of abdominal pain for 3 days. She states she's been out of her Xanax. Patient states she's having abdominal pain but when asked about the chronicity of this she states has been going on for years. She also complains of frontal headache of unknown chronicity. The headache appears more facial. Past Medical History  Diagnosis Date  . Seizures   . Hypertension   . Stroke     2007 - R sided weakness and expressive aphasia with h/o behavioral problems related to this  . Asthma   . Diabetes mellitus   . Chronic systolic CHF (congestive heart failure)   . Atrial fibrillation     a. On Xarelto after having DVT (prev felt to be poor anticoag cand 2/2 noncompliance).  . History of DVT (deep vein thrombosis)     a. 06/2012: RLE DVT, + protein C deficiency, placed on Xarelto. s/p IVC filter  . Noncompliance     Due to mental status, family has had to coax her to take medicines  . Hypothyroidism   . VT (ventricular tachycardia)     a. h/o ICD ~2007-2008. b. Adm 08/2012 with UTI, had VT/ICD shock x 5 (hypokalemic);  c. 06/2013 s/p ICD Gen change: SJM DC ICD ser #: [id number]  . Protein C deficiency   . NICM (nonischemic cardiomyopathy)     a. s/p AICD ~2007 ([employer]) - previous care [place]. No known hx CAD. b. EF 10% by echo 04/2012;  c. 06/2013 s/p ICD Gen change: SJM DC ICD ser #: [id number]  . 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 Panic attacks    Past Surgical History  Procedure Laterality Date  . Cholecystectomy     . Appendectomy    . Pacemaker insertion      [employer]  . Vena cava filter placement  07/19/2012    Procedure: INSERTION VENA-CAVA FILTER;  Surgeon: Larina Earthly, MD;  Location: [facility] OR;  Service: Vascular;  Laterality: [name];  . Insert / replace / remove pacemaker      ICD. [employer]  . Abdominal hysterectomy     Family History  Problem Relation Age of Onset  . Hypertension Mother   . Coronary artery disease Neg Hx    History  Substance Use Topics  . Smoking status: Former Games developer  . Smokeless tobacco: Never Used  . Alcohol Use: No   OB History   Grav Para Term Preterm Abortions TAB SAB Ect Mult Living                 Review of Systems  Unable to perform ROS: Other  Gastrointestinal: Positive for abdominal pain.  Neurological: Positive for headaches.      Allergies  Penicillins and Sulfa antibiotics  Home Medications   Prior to Admission medications   Medication Sig Start Date End Date Taking? Authorizing Provider  acetaminophen (TYLENOL) 325 MG tablet Take 650 mg by mouth every 6 (six) hours as needed for pain.    Historical Provider, MD  albuterol (PROVENTIL) (2.5 MG/3ML)  0.083% nebulizer solution Take 2.5 mg by nebulization every 6 (six) hours as needed. For cough or wheeze    Historical Provider, MD  ALPRAZolam Prudy Feeler) 0.5 MG tablet Take 0.5 mg by mouth at bedtime as needed for sleep.    Historical Provider, MD  ALPRAZolam Prudy Feeler) 1 MG tablet Take 1 tablet (1 mg total) by mouth at bedtime as needed for anxiety. 01/10/14   Rolland Porter, MD  amiodarone (PACERONE) 200 MG tablet Take 1 tablet daily except none on weekend confirmed with family member 11/24/2013 11/24/13   Duke Salvia, MD  digoxin (LANOXIN) 0.125 MG tablet Take 1 tablet (0.125 mg total) by mouth daily. 09/01/13   Marinda Elk, MD  escitalopram (LEXAPRO) 20 MG tablet Take 1 tablet (20 mg total) by mouth daily. 09/01/13   Marinda Elk, MD  furosemide (LASIX) 40 MG tablet Take 40 mg by mouth 2 (two)  times daily.    Historical Provider, MD  insulin lispro (HUMALOG) 100 UNIT/ML injection Inject into the skin 3 (three) times daily before meals. On sliding scale    Historical Provider, MD  levETIRAcetam (KEPPRA) 1000 MG tablet Take 2,000 mg by mouth 2 (two) times daily.    Historical Provider, MD  levothyroxine (SYNTHROID, LEVOTHROID) 50 MCG tablet Take 50 mcg by mouth daily before breakfast.    Historical Provider, MD  lisinopril (PRINIVIL,ZESTRIL) 5 MG tablet Take 1 tablet (5 mg total) by mouth daily. 12/17/13   Amy D Clegg, NP  metolazone (ZAROXOLYN) 2.5 MG tablet TAKE 1 TABLET BY MOUTH AS NEEDED. FOR FLUID RETENTION 12/25/13   Dolores Patty, MD  metoprolol tartrate (LOPRESSOR) 25 MG tablet Take 1 tablet (25 mg total) by mouth 2 (two) times daily. 09/01/13   Marinda Elk, MD  omeprazole (PRILOSEC) 20 MG capsule Take 20 mg by mouth 2 (two) times daily.    Historical Provider, MD  oxyCODONE-acetaminophen (PERCOCET) 10-325 MG per tablet Take 1 tablet by mouth every 8 (eight) hours as needed for pain.    Historical Provider, MD  polyethylene glycol (MIRALAX / GLYCOLAX) packet Take 17 g by mouth daily as needed. For constipation    Historical Provider, MD  potassium chloride 20 MEQ/15ML (10%) solution Take 15 mLs (20 mEq total) by mouth daily. 09/01/13   Marinda Elk, MD  QUEtiapine Fumarate (SEROQUEL XR) 150 MG 24 hr tablet Take 1 tablet (150 mg total) by mouth at bedtime. 09/01/13   Marinda Elk, MD  Rivaroxaban (XARELTO) 20 MG TABS tablet Take 20 mg by mouth daily.    Historical Provider, MD  spironolactone (ALDACTONE) 25 MG tablet Take 25 mg by mouth daily.    Historical Provider, MD  Vitamin D, Ergocalciferol, (DRISDOL) 50000 UNITS CAPS capsule Take 50,000 Units by mouth every 30 (thirty) days.    Historical Provider, MD   BP 132/77  Pulse 123  Temp(Src) 97.7 F (36.5 C) (Oral)  Resp 22  SpO2 99% Physical Exam  Nursing note and vitals reviewed. Constitutional: She  appears well-developed and well-nourished. No distress.  Emotionally labile.  HENT:  Head: Normocephalic and atraumatic.  Mouth/Throat: Oropharynx is clear and moist. No oropharyngeal exudate.  Tenderness to percussion over the left frontal and maxillary sinuses. Patient has bilateral nasal mucosal edema.  Eyes: EOM are normal. Pupils are equal, round, and reactive to light.  Neck: Normal range of motion. Neck supple.  No meningismus  Cardiovascular: Normal rate and regular rhythm.  Exam reveals no gallop and no friction rub.  No murmur heard. Pulmonary/Chest: Effort normal and breath sounds normal. No respiratory distress. She has no wheezes. She has no rales.  Abdominal: Soft. Bowel sounds are normal. She exhibits distension (distended abdomen). She exhibits no mass. There is no tenderness (no appreciable tenderness with palpation.). There is no rebound and no guarding.  Musculoskeletal: Normal range of motion. She exhibits no edema and no tenderness.  No calf swelling or tenderness.  Neurological: She is alert.  Right upper and lower extremity deficits. Patient with expressive aphasia. This baseline. No new weakness or numbness.  Skin: Skin is warm and dry. No rash noted. No erythema.    ED Course  Procedures (including critical care time) Labs Review Labs Reviewed  CBC WITH DIFFERENTIAL - Abnormal; Notable for the following:    RBC 3.69 (*)    Hemoglobin 7.8 (*)    HCT 27.3 (*)    MCV 74.0 (*)    MCH 21.1 (*)    MCHC 28.6 (*)    RDW 21.0 (*)    All other components within normal limits  COMPREHENSIVE METABOLIC PANEL - Abnormal; Notable for the following:    Glucose, Bld 224 (*)    Albumin 3.1 (*)    Total Bilirubin 1.5 (*)    GFR calc non Af Amer 62 (*)    GFR calc Af Amer 72 (*)    All other components within normal limits  URINALYSIS, ROUTINE W REFLEX MICROSCOPIC - Abnormal; Notable for the following:    Color, Urine AMBER (*)    Bilirubin Urine SMALL (*)    Protein,  ur 100 (*)    All other components within normal limits  URINE MICROSCOPIC-ADD ON - Abnormal; Notable for the following:    Casts HYALINE CASTS (*)    Crystals CA OXALATE CRYSTALS (*)    All other components within normal limits  LIPASE, BLOOD  TROPONIN I    Imaging Review Dg Abd Acute W/chest  03/21/2014   CLINICAL DATA:  Abdominal pain for 3 days  EXAM: ACUTE ABDOMEN SERIES (ABDOMEN 2 VIEW & CHEST 1 VIEW)  COMPARISON:  02/11/2014 chest radiograph  FINDINGS: Dual-chamber left approach ICD/ pacer leads have unchanged orientation. There is chronic, marked cardiopericardial enlargement. There is bronchial cuffing, cephalized blood flow, and Kerley lines. The retrocardiac lung is obscured by the heart.  The bowel gas pattern is nonobstructed. Cholecystectomy clips are noted. The liver shadow is prominent, possibly hepatic congestion. No evidence for urolithiasis. No pneumoperitoneum.  IMPRESSION: 1. CHF. 2. Negative abdominal radiographs.   Electronically Signed   By: Tiburcio Pea M.D.   On: 03/21/2014 03:33     EKG Interpretation None     CRITICAL CARE Performed by: Ranae Palms, Preslei Blakley Total critical care time: 40 min  Critical care time was exclusive of separately billable procedures and treating other patients. Critical care was necessary to treat or prevent imminent or life-threatening deterioration. Critical care was time spent personally by me on the following activities: development of treatment plan with patient and/or surrogate as well as nursing, discussions with consultants, evaluation of patient's response to treatment, examination of patient, obtaining history from patient or surrogate, ordering and performing treatments and interventions, ordering and review of laboratory studies, ordering and review of radiographic studies, pulse oximetry and re-evaluation of patient's condition.  MDM   Final diagnoses:  None    Patient is a very poor historian. Her exam appears benign.  Will screen with basic labs and plain x-rays.  Patient has a history of anemia  with gradual worsening over the last few months. She does have an outlier laboratory value which was taken one month ago as an i-STAT. I doubt the validity of this is value. Her other hemoglobin values have been consistent with gradual decrease. She's not been transfused recently.   Patient is demonstrating behavioral issues and agitation. Required multiple doses of benzodiazepines and antipsychotics. Initially, decided to get CT head because of the patient's mental status however she was uncooperative and would not lie flat. She is observed in the emergency apartment for multiple hours. According to previous records this appears to be her baseline mental status. Her vital signs remained stable.  Discussed at length with the patient's brother. He was with the patient and called EMS this evening. He states he called because she woke up and was panicked. She normally takes Xanax at night for agitation and ran out. He states that she is currently at her baseline mental status and the his behavior is common for the patient.  Patient appears to have increasing work of breathing. She is unable to lie flat. She is maintaining her oxygen saturations were heart rate is now pretty consistently in the 120s. She does have a history of congestive heart failure and this is visualized on her chest x-ray. Given her poor history and worsening exam with elevated heart rate and decreasing hemoglobin believe that she likely needs to be admitted. We'll discuss with [facility] hospitalist.  Patient now more comfortable after IV Ativan and being placed on BiPAP. Heart rate beginning to trend down. Discussed with Dr. Allena KatzPatel. Will accept in transfer to step down bed for observation. Agrees with plan to give IV Lasix. Patient does have some upper airway wheezing sounds the lungs are clear. This may be laryngeal spasm. I do not believe the patient needs to be  intubated at this point. We'll give a dose of Decadron and monitor closely.   Patient is now much more comfortable. She is resting but awakens to stimuli. Upper airway breath sounds have improved. She maintains high oxygen saturation.  Loren Raceravid Ilayda Toda, MD 03/21/14 [phone]  Spoke with patient's brother and [facility].  Loren Raceravid Amani Marseille, MD 03/21/14 [phone]

## 2014-03-21 NOTE — ED Notes (Signed)
Pt c/o abd pain x 3 days  Denies n/v/d  Also c/o ha

## 2014-03-21 NOTE — ED Notes (Signed)
Pt yelling again, rocking back and forth, cursing, throwing things, asking to get oob but no answering any other questions. md notified. Attempted ct scan but patient refused to lay on table and was attempting to get up so scan was aborted for patient safety

## 2014-03-21 NOTE — ED Notes (Signed)
patient awake & alert at transfer, able to follow commands

## 2014-03-21 NOTE — ED Notes (Addendum)
C/o abd pain x 3 days  Denies n/v/d  States is out of xanax

## 2014-03-21 NOTE — Progress Notes (Signed)
Nutrition Brief Note  Malnutrition Screening Tool result is inaccurate.  Please consult if nutrition needs are identified.  Charlott Rakes MS, RD, LDN [phone] Weekend/After Hours Pager

## 2014-03-21 NOTE — Consult Note (Signed)
Reason for Consult:Altered mental status Referring Physician: Rai  CC: Altered mental status  HPI: Elizabeth Love is an 58 y.o. female admitted with altered mental status.  Family is not available to obtain history therefore history is obtained from the chart.  It seems that the patient around midnight woke up screaming (and per the brother she does that usually due to her anxiety attacks) and was agitated. Patient had run out of her Xanax and had not been taking her medications including the Lasix. She has been having abdominal pain for last 3 days.  Due to her abdominal pain and agitation the patient was brought to the ED for evaluation.  The patient has a history of stroke and seizure but has not been noted to have a seizure by family.  Was given multiple doses of Ativan and Geodon on the ED. At baseline the patient is alert but only says "yes" and "no".  She is often agitated and will wake up screaming.  She takes Xanax for agitation but has been out.  Patient usually ambulates although minimally with a walker.     Past Medical History  Diagnosis Date  . Seizures   . Hypertension   . Stroke     2007 - R sided weakness and expressive aphasia with h/o behavioral problems related to this  . Asthma   . Diabetes mellitus   . Chronic systolic CHF (congestive heart failure)   . Atrial fibrillation     a. On Xarelto after having DVT (prev felt to be poor anticoag cand 2/2 noncompliance).  . History of DVT (deep vein thrombosis)     a. 06/2012: RLE DVT, + protein C deficiency, placed on Xarelto. s/p IVC filter  . Noncompliance     Due to mental status, family has had to coax her to take medicines  . Hypothyroidism   . VT (ventricular tachycardia)     a. h/o ICD ~2007-2008. b. Adm 08/2012 with UTI, had VT/ICD shock x 5 (hypokalemic);  c. 06/2013 s/p ICD Gen change: SJM DC ICD ser #: [id number]  . Protein C deficiency   . NICM (nonischemic cardiomyopathy)     a. s/p AICD ~2007 ([employer]) -  previous [place]. No known hx CAD. b. EF 10% by echo 04/2012;  c. 06/2013 s/p ICD Gen change: SJM DC ICD ser #: [id number]  . 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 Panic attacks     Past Surgical History  Procedure Laterality Date  . Cholecystectomy    . Appendectomy    . Pacemaker insertion      [employer]  . Vena cava filter placement  07/19/2012    Procedure: INSERTION VENA-CAVA FILTER;  Surgeon: Larina Earthlyodd F Early, MD;  [facility];  Service: Vascular;  Laterality: [name];  . Insert / replace / remove pacemaker      ICD. [employer]  . Abdominal hysterectomy      Family History  Problem Relation Age of Onset  . Hypertension Mother   . Coronary artery disease Neg Hx     Social History:  reports that she has quit smoking. She has never used smokeless tobacco. She reports that she does not drink alcohol or use illicit drugs.  Allergies  Allergen Reactions  . Penicillins Rash  . Sulfa Antibiotics Rash    Medications:  I have reviewed the patient's current medications. Prior to Admission:  Prescriptions prior to admission  Medication Sig Dispense Refill  .  acetaminophen (TYLENOL) 325 MG tablet Take 650 mg by mouth every 6 (six) hours as needed for pain.      Marland Kitchen albuterol (PROVENTIL) (2.5 MG/3ML) 0.083% nebulizer solution Take 2.5 mg by nebulization every 6 (six) hours as needed. For cough or wheeze      . ALPRAZolam (XANAX) 0.5 MG tablet Take 0.5 mg by mouth at bedtime as needed for sleep.      Marland Kitchen ALPRAZolam (XANAX) 1 MG tablet Take 1 tablet (1 mg total) by mouth at bedtime as needed for anxiety.  10 tablet  0  . amiodarone (PACERONE) 200 MG tablet Take 1 tablet daily except none on weekend confirmed with family member 11/24/2013  30 tablet  6  . digoxin (LANOXIN) 0.125 MG tablet Take 1 tablet (0.125 mg total) by mouth daily.  30 tablet  0  . escitalopram (LEXAPRO) 20 MG tablet Take 1 tablet (20 mg total) by mouth daily.      . furosemide (LASIX) 40 MG tablet Take 40 mg by  mouth 2 (two) times daily.      . insulin lispro (HUMALOG) 100 UNIT/ML injection Inject into the skin 3 (three) times daily before meals. On sliding scale      . levETIRAcetam (KEPPRA) 1000 MG tablet Take 2,000 mg by mouth 2 (two) times daily.      Marland Kitchen levothyroxine (SYNTHROID, LEVOTHROID) 50 MCG tablet Take 50 mcg by mouth daily before breakfast.      . lisinopril (PRINIVIL,ZESTRIL) 5 MG tablet Take 1 tablet (5 mg total) by mouth daily.  30 tablet  6  . metolazone (ZAROXOLYN) 2.5 MG tablet TAKE 1 TABLET BY MOUTH AS NEEDED. FOR FLUID RETENTION  5 tablet  6  . metoprolol tartrate (LOPRESSOR) 25 MG tablet Take 1 tablet (25 mg total) by mouth 2 (two) times daily.      Marland Kitchen omeprazole (PRILOSEC) 20 MG capsule Take 20 mg by mouth 2 (two) times daily.      Marland Kitchen oxyCODONE-acetaminophen (PERCOCET) 10-325 MG per tablet Take 1 tablet by mouth every 8 (eight) hours as needed for pain.      . polyethylene glycol (MIRALAX / GLYCOLAX) packet Take 17 g by mouth daily as needed. For constipation      . potassium chloride 20 MEQ/15ML (10%) solution Take 15 mLs (20 mEq total) by mouth daily.  500 mL  0  . QUEtiapine Fumarate (SEROQUEL XR) 150 MG 24 hr tablet Take 1 tablet (150 mg total) by mouth at bedtime.  30 tablet  0  . Rivaroxaban (XARELTO) 20 MG TABS tablet Take 20 mg by mouth daily.      Marland Kitchen spironolactone (ALDACTONE) 25 MG tablet Take 25 mg by mouth daily.      . Vitamin D, Ergocalciferol, (DRISDOL) 50000 UNITS CAPS capsule Take 50,000 Units by mouth every 30 (thirty) days.       Scheduled: . furosemide  40 mg Intravenous Q12H  . ipratropium-albuterol  3 mL Nebulization Q4H  . levETIRAcetam  2,000 mg Oral BID  . levothyroxine  25 mcg Intravenous Daily  . pantoprazole (PROTONIX) IV  40 mg Intravenous Q12H  . sodium chloride  3 mL Intravenous Q12H  . sodium chloride  3 mL Intravenous Q12H    ROS: Unable to obtain other than that included in HPI  Physical Examination: Blood pressure 114/63, pulse 87,  temperature 97.3 F (36.3 C), temperature source Axillary, resp. rate 35, height 5\' 2"  (1.575 m), weight 52.164 kg (115 lb), SpO2 93.00%.  Neurologic Examination  Mental Status: Patient does not respond to verbal stimuli.  Grimaces to deep sternal rub.  Does not follow commands.  No verbalizations noted.  Cranial Nerves: II: patient does not respond confrontation bilaterally, pupils right 3 mm, left 3 mm,and reactive bilaterally III,IV,VI: doll's response present bilaterally.  V,VII: corneal reflex reduced on the left  VIII: patient does not respond to verbal stimuli IX,X: gag reflex reduced, XI: trapezius strength unable to test bilaterally XII: tongue strength unable to test Motor: Patient shows no spontaneous movements.  Does show withdrawal to pain on the left Sensory: Responds to noxious stimuli on the left. Deep Tendon Reflexes:  Patient receiving blood and right upper extremity not tested.  2+ in the left upper extremity.  Absent in the lower extremities.   Plantars: downgoing on the left.  Mute on the right Cerebellar: Unable to perform    Laboratory Studies:   Basic Metabolic Panel:  Recent Labs Lab 03/21/14 0249  NA 138  K 4.1  CL 103  CO2 20  GLUCOSE 224*  BUN 19  CREATININE 1.00  CALCIUM 9.8    Liver Function Tests:  Recent Labs Lab 03/21/14 0249  AST 24  ALT 10  ALKPHOS 67  BILITOT 1.5*  PROT 8.2  ALBUMIN 3.1*    Recent Labs Lab 03/21/14 0249  LIPASE 26    Recent Labs Lab 03/21/14 1205  AMMONIA 24    CBC:  Recent Labs Lab 03/21/14 0249 03/21/14 1205  WBC 6.2  --   NEUTROABS 4.7  --   HGB 7.8* 8.1*  HCT 27.3* 29.3*  MCV 74.0*  --   PLT 239  --     Cardiac Enzymes:  Recent Labs Lab 03/21/14 0249 03/21/14 1200  TROPONINI <0.30 <0.30    BNP: No components found with this basename: POCBNP,   CBG: No results found for this basename: GLUCAP,  in the last 168 hours  Microbiology: Results for orders placed during the  hospital encounter of 03/21/14  MRSA PCR SCREENING     Status: None   Collection Time    03/21/14  8:59 AM      Result Value Ref Range Status   MRSA by PCR NEGATIVE  NEGATIVE Final   Comment:            The GeneXpert MRSA Assay (FDA     approved for NASAL specimens     only), is one component of a     comprehensive MRSA colonization     surveillance program. It is not     intended to diagnose MRSA     infection nor to guide or     monitor treatment for     MRSA infections.    Coagulation Studies:  Recent Labs  03/21/14 0633  LABPROT 27.0*  INR 2.50*    Urinalysis:  Recent Labs Lab 03/21/14 0458  COLORURINE AMBER*  LABSPEC 1.028  PHURINE 5.0  GLUCOSEU NEGATIVE  HGBUR NEGATIVE  BILIRUBINUR SMALL*  KETONESUR NEGATIVE  PROTEINUR 100*  UROBILINOGEN 1.0  NITRITE NEGATIVE  LEUKOCYTESUR NEGATIVE    Lipid Panel:  No results found for this basename: chol, trig, hdl, cholhdl, vldl, ldlcalc    HgbA1C:  Lab Results  Component Value Date   HGBA1C 6.8* 04/11/2013    Urine Drug Screen:     Component Value Date/Time   LABOPIA NONE DETECTED 03/21/2014 1149   LABOPIA NEGATIVE 07/21/2013 1217   COCAINSCRNUR NONE DETECTED 03/21/2014 1149   COCAINSCRNUR NEGATIVE 07/21/2013 1217   LABBENZ NONE  DETECTED 03/21/2014 1149   LABBENZ NEGATIVE 07/21/2013 1217   AMPHETMU NONE DETECTED 03/21/2014 1149   AMPHETMU NEGATIVE 07/21/2013 1217   THCU NONE DETECTED 03/21/2014 1149   LABBARB NONE DETECTED 03/21/2014 1149    Alcohol Level: No results found for this basename: ETH,  in the last 168 hours  Other results: EKG: sinus rhythm at 91 bpm.  Imaging: Ct Head Wo Contrast  03/21/2014   CLINICAL DATA:  Mental status changes  EXAM: CT HEAD WITHOUT CONTRAST  TECHNIQUE: Contiguous axial images were obtained from the base of the skull through the vertex without intravenous contrast.  COMPARISON:  Head CT 01/10/2014  FINDINGS: Encephalomalacia change again appreciated in a left MCA distribution involving  the left temporal, frontal and parietal lobes, consistent with chronic infarction. There is left lateral ventricular hydrocephalus ex vacuo. No further evidence of extra-axial fluid collections nor acute hemorrhage. No mass effect. The calvarium is negative. The visualized paranasal sinuses and mastoid air cells are patent.  IMPRESSION: No acute intracranial abnormality.  Stable chronic changes.   Electronically Signed   By: Salome Holmes M.D.   On: 03/21/2014 13:34   Ct Abdomen Pelvis W Contrast  03/21/2014   CLINICAL DATA:  Abdominal pain, too lethargic to tolerate oral contrast  EXAM: CT ABDOMEN AND PELVIS WITH CONTRAST  TECHNIQUE: Multidetector CT imaging of the abdomen and pelvis was performed using the standard protocol following bolus administration of intravenous contrast.  CONTRAST:  100 mL Omnipaque 300  COMPARISON:  None.  FINDINGS: Severe multichamber cardiomegaly. Very small right pleural effusion.  The liver, spleen, adrenals, pancreas, right kidney are unremarkable. Small stable benign-appearing cyst left kidney otherwise unremarkable.  An inferior vena caval filter appreciated below the renal veins. There is no abdominal aortic aneurysm. Atherosclerotic calcifications are appreciated within the aorta and iliac vessels.  No abdominal pelvic masses, or loculated fluid collections. Trace amount of ascites along the inferior border of the liver. Small amount of free fluid within the dependent portion of pelvis.  The bowel and appendix are negative, patient status post cholecystectomy.  No abdominal wall nor inguinal hernia.  No aggressive appearing osseous lesions.  IMPRESSION: Mild area is of ascites along the inferior border of the liver and deep in the portion of the pelvis. These findings are nonspecific. There is otherwise no CT evidence of obstructive or inflammatory abnormalities.  Multichamber cardiomegaly  Small right pleural effusion   Electronically Signed   By: Salome Holmes M.D.   On:  03/21/2014 13:52   Dg Abd Acute W/chest  03/21/2014   CLINICAL DATA:  Abdominal pain for 3 days  EXAM: ACUTE ABDOMEN SERIES (ABDOMEN 2 VIEW & CHEST 1 VIEW)  COMPARISON:  02/11/2014 chest radiograph  FINDINGS: Dual-chamber left approach ICD/ pacer leads have unchanged orientation. There is chronic, marked cardiopericardial enlargement. There is bronchial cuffing, cephalized blood flow, and Kerley lines. The retrocardiac lung is obscured by the heart.  The bowel gas pattern is nonobstructed. Cholecystectomy clips are noted. The liver shadow is prominent, possibly hepatic congestion. No evidence for urolithiasis. No pneumoperitoneum.  IMPRESSION: 1. CHF. 2. Negative abdominal radiographs.   Electronically Signed   By: Tiburcio Pea M.D.   On: 03/21/2014 03:33     Assessment/Plan: 58 year old female presenting due to agitation and pain, now lethargic and minimally responsive.  Has had doses of Ativan and Geodon.  Patient's lethargy likely secondary to medications.  Head CT reviewed and shows no acute changes.  No new focality on neurological  examination.    Recommendations: 1.  Would continue to follow clinically with neurochecks to determine wearing off effect of medications. 2.  Agree with seizure precautions  3.  Continue Keppra at home dose of 2000mg  BID 4.  EEG 5.  If patient not more alert in 24 hours will consider repeat head CT.  Patient with ICD unable to have a MRI  Thana Farr, MD [facility] [phone] 03/21/2014, 4:54 PM

## 2014-03-22 ENCOUNTER — Encounter (HOSPITAL_COMMUNITY): Payer: Self-pay | Admitting: Physician Assistant

## 2014-03-22 DIAGNOSIS — G8929 Other chronic pain: Secondary | ICD-10-CM

## 2014-03-22 DIAGNOSIS — I5023 Acute on chronic systolic (congestive) heart failure: Secondary | ICD-10-CM

## 2014-03-22 DIAGNOSIS — J9600 Acute respiratory failure, unspecified whether with hypoxia or hypercapnia: Secondary | ICD-10-CM | POA: Diagnosis not present

## 2014-03-22 DIAGNOSIS — D509 Iron deficiency anemia, unspecified: Secondary | ICD-10-CM

## 2014-03-22 DIAGNOSIS — I509 Heart failure, unspecified: Secondary | ICD-10-CM

## 2014-03-22 LAB — URINE CULTURE
Colony Count: NO GROWTH
Culture: NO GROWTH

## 2014-03-22 LAB — CBC
HEMATOCRIT: 29.9 % — AB (ref 36.0–46.0)
HEMOGLOBIN: 8.6 g/dL — AB (ref 12.0–15.0)
MCH: 21.6 pg — ABNORMAL LOW (ref 26.0–34.0)
MCHC: 28.8 g/dL — ABNORMAL LOW (ref 30.0–36.0)
MCV: 74.9 fL — ABNORMAL LOW (ref 78.0–100.0)
Platelets: 244 10*3/uL (ref 150–400)
RBC: 3.99 MIL/uL (ref 3.87–5.11)
RDW: 19.9 % — AB (ref 11.5–15.5)
WBC: 4.6 10*3/uL (ref 4.0–10.5)

## 2014-03-22 LAB — HEMOGLOBIN A1C
HEMOGLOBIN A1C: 7.7 % — AB (ref <5.7)
Mean Plasma Glucose: 174 mg/dL — ABNORMAL HIGH (ref <117)

## 2014-03-22 LAB — TYPE AND SCREEN
ABO/RH(D): A POS
Antibody Screen: NEGATIVE
Unit division: 0

## 2014-03-22 LAB — HEMOGLOBIN AND HEMATOCRIT, BLOOD
HCT: 30.4 % — ABNORMAL LOW (ref 36.0–46.0)
HEMATOCRIT: 29.6 % — AB (ref 36.0–46.0)
Hemoglobin: 8.8 g/dL — ABNORMAL LOW (ref 12.0–15.0)
Hemoglobin: 9.1 g/dL — ABNORMAL LOW (ref 12.0–15.0)

## 2014-03-22 LAB — BASIC METABOLIC PANEL
Anion gap: 23 — ABNORMAL HIGH (ref 5–15)
BUN: 25 mg/dL — AB (ref 6–23)
CO2: 15 mEq/L — ABNORMAL LOW (ref 19–32)
Calcium: 9.9 mg/dL (ref 8.4–10.5)
Chloride: 100 mEq/L (ref 96–112)
Creatinine, Ser: 1.15 mg/dL — ABNORMAL HIGH (ref 0.50–1.10)
GFR calc Af Amer: 61 mL/min — ABNORMAL LOW (ref >90)
GFR calc non Af Amer: 53 mL/min — ABNORMAL LOW (ref >90)
GLUCOSE: 193 mg/dL — AB (ref 70–99)
POTASSIUM: 4.8 meq/L (ref 3.7–5.3)
Sodium: 138 mEq/L (ref 137–147)

## 2014-03-22 LAB — OCCULT BLOOD X 1 CARD TO LAB, STOOL: Fecal Occult Bld: NEGATIVE

## 2014-03-22 LAB — GLUCOSE, CAPILLARY
Glucose-Capillary: 176 mg/dL — ABNORMAL HIGH (ref 70–99)
Glucose-Capillary: 167 mg/dL — ABNORMAL HIGH (ref 70–99)
Glucose-Capillary: 120 mg/dL — ABNORMAL HIGH (ref 70–99)

## 2014-03-22 MED ORDER — OXYCODONE-ACETAMINOPHEN 5-325 MG PO TABS
1.0000 | ORAL_TABLET | Freq: Three times a day (TID) | ORAL | Status: DC | PRN
  Administered 2014-03-22 – 2014-03-23 (×2): 1 via ORAL
  Filled 2014-03-22 (×2): qty 1

## 2014-03-22 MED ORDER — QUETIAPINE FUMARATE ER 50 MG PO TB24
150.0000 mg | ORAL_TABLET | Freq: Every day | ORAL | Status: DC
  Administered 2014-03-22 – 2014-03-23 (×2): 150 mg via ORAL
  Filled 2014-03-22 (×4): qty 3

## 2014-03-22 MED ORDER — POLYETHYLENE GLYCOL 3350 17 G PO PACK
17.0000 g | PACK | Freq: Every day | ORAL | Status: DC | PRN
  Filled 2014-03-22: qty 1

## 2014-03-22 MED ORDER — INSULIN ASPART 100 UNIT/ML ~~LOC~~ SOLN
0.0000 [IU] | Freq: Three times a day (TID) | SUBCUTANEOUS | Status: DC
  Administered 2014-03-22 (×2): 2 [IU] via SUBCUTANEOUS
  Administered 2014-03-23: 1 [IU] via SUBCUTANEOUS
  Administered 2014-03-24: 5 [IU] via SUBCUTANEOUS

## 2014-03-22 MED ORDER — LEVOTHYROXINE SODIUM 50 MCG PO TABS
50.0000 ug | ORAL_TABLET | Freq: Every day | ORAL | Status: DC
  Administered 2014-03-22 – 2014-03-23 (×2): 50 ug via ORAL
  Filled 2014-03-22 (×4): qty 1

## 2014-03-22 MED ORDER — OXYCODONE HCL 5 MG PO TABS: 5.0000 mg | ORAL_TABLET | Freq: Three times a day (TID) | ORAL | Status: DC | PRN

## 2014-03-22 MED ORDER — OXYCODONE-ACETAMINOPHEN 10-325 MG PO TABS: 1.0000 | ORAL_TABLET | Freq: Three times a day (TID) | ORAL | Status: DC | PRN

## 2014-03-22 MED ORDER — ACETAMINOPHEN 325 MG PO TABS: 650.0000 mg | ORAL_TABLET | Freq: Four times a day (QID) | ORAL | Status: DC | PRN

## 2014-03-22 MED ORDER — CARVEDILOL 3.125 MG PO TABS
3.1250 mg | ORAL_TABLET | Freq: Two times a day (BID) | ORAL | Status: DC
  Administered 2014-03-22 – 2014-03-23 (×2): 3.125 mg via ORAL
  Filled 2014-03-22 (×6): qty 1

## 2014-03-22 MED ORDER — INSULIN ASPART 100 UNIT/ML ~~LOC~~ SOLN: 0.0000 [IU] | Freq: Every day | SUBCUTANEOUS | Status: DC

## 2014-03-22 MED ORDER — LORAZEPAM 1 MG PO TABS
1.0000 mg | ORAL_TABLET | Freq: Four times a day (QID) | ORAL | Status: DC | PRN
  Administered 2014-03-22 (×2): 1 mg via ORAL
  Filled 2014-03-22: qty 2
  Filled 2014-03-22: qty 1
  Filled 2014-03-22: qty 2
  Filled 2014-03-22: qty 1

## 2014-03-22 MED ORDER — METOPROLOL TARTRATE 25 MG PO TABS
25.0000 mg | ORAL_TABLET | Freq: Two times a day (BID) | ORAL | Status: DC
  Administered 2014-03-22: 25 mg via ORAL
  Filled 2014-03-22 (×2): qty 1

## 2014-03-22 MED ORDER — IPRATROPIUM-ALBUTEROL 0.5-2.5 (3) MG/3ML IN SOLN
3.0000 mL | Freq: Three times a day (TID) | RESPIRATORY_TRACT | Status: DC
  Administered 2014-03-22 – 2014-03-23 (×5): 3 mL via RESPIRATORY_TRACT
  Filled 2014-03-22 (×5): qty 3

## 2014-03-22 MED ORDER — DIGOXIN 125 MCG PO TABS
0.1250 mg | ORAL_TABLET | Freq: Every day | ORAL | Status: DC
  Administered 2014-03-22 – 2014-03-23 (×2): 0.125 mg via ORAL
  Filled 2014-03-22 (×3): qty 1

## 2014-03-22 MED ORDER — ESCITALOPRAM OXALATE 20 MG PO TABS
20.0000 mg | ORAL_TABLET | Freq: Every day | ORAL | Status: DC
  Administered 2014-03-22 – 2014-03-23 (×2): 20 mg via ORAL
  Filled 2014-03-22 (×3): qty 1

## 2014-03-22 NOTE — Progress Notes (Signed)
Patient ID: Elizabeth Love  female  VVZ:[id number]    DOB: 1956/08/22    DOA: 03/21/2014  PCP: Estill Cotta, MD  Assessment/Plan:  Principal Problem:  Acute encephalopathy: Improving, patient is alert, awake and conversing, has dysarthria due to prior stroke  - CT head negative for any acute stroke, cannot have MRI due to ICD - EEG pending, continue serial neuro checks, seizure precautions, seizure precautions (had run out off Xanax and prior history of seizures). Neurology following - Ammonia level normal, urine drug screen negative, TSH 1.8  - No haldol due to prolonged QTC   Active Problems:  Chronic pain  - Asking for pain medications, placed on home dose of Percocet, no escalation of pain medications  Anemia with Abdominal pain: Unsure of chronicity of the pain or if any GI bleed, no vomiting or hematemesis per brother, still possibility of GI bleed as patient is on anticoagulation , no active bleeding -CT abdomen pelvis negative for any acute abdominal pathology or retroperitoneal bleed - Status post one unit packed RBC transfusion 7/4 - anemia panel consistent with iron deficiency, ferritin 26, iron level 16, Saturation ratio 3 - Hold off on xarelto  - GI consult called, discussed with Dr. Marina Goodell - Placed on clear liquid diet  Acute on chronic combined systolic and diastolic CHF: Medically noncompliant, history of ICD, NICM  - BNP 11,633, was given Lasix in ED  - Negative 274 cc on I.'s and O.'s, continue IV diuresis - 2-D echo showed EF of 15% with diffuse hypokinesis, prior echo in 07/2013 head showed EF of 20% with diffuse hypokinesis  - Cardiology consult called  History of atrial fibrillation - Currently rate controlled, hold xarelto (anemia with ? GI bleed) - Patient is on amiodarone on weekdays (?), per PTA meds, does not take on the weekend, will defer to cardiology for further recommendations   DM type 2 (diabetes mellitus, type 2)  - Continue sliding  scale insulin  History of stroke with residual effects: With right-sided hemiparesis per patient's brother, no dysphagia  - continue clear liquid diet, will advance diet as recommended by GI  Hypertension  - Currently stable   Asthma with Acute on chronic respiratory failure  -Currently stable continue duonebs  History of Seizures  - Family had not noticed any seizure activity, continue Keppra   Hypothyroidism  - Continue Synthroid, TSH 1.8  DVT Prophylaxis: SCDs  Code Status: Full code  Family Communication:  Disposition:  Consultants:  Cardiology  Neurology  GI  Procedures:  None  Antibiotics:  None    Subjective: Patient seen and examined, sitting up in the bed, crying, asking for pain medication for headache  Objective: Weight change:   Intake/Output Summary (Last 24 hours) at 03/22/14 1017 Last data filed at 03/22/14 0900  Gross per 24 hour  Intake  925.5 ml  Output   1200 ml  Net -274.5 ml   Blood pressure 121/77, pulse 96, temperature 98 F (36.7 C), temperature source Oral, resp. rate 37, height 5\' 2"  (1.575 m), weight 53.6 kg (118 lb 2.7 oz), SpO2 94.00%.  Physical Exam: General: Alert and awake, oriented to self, crying CVS: S1-S2 clear, no murmur rubs or gallops Chest: clear to auscultation bilaterally, no wheezing, rales or rhonchi Abdomen: soft nontender, nondistended, normal bowel sounds  Extremities: no cyanosis, clubbing or edema noted bilaterally Neuro: Right-sided hemiparesis, dysarthria from previous stroke  Lab Results: Basic Metabolic Panel:  Recent Labs Lab 03/21/14 0249 03/22/14 0315  NA 138 138  K 4.1 4.8  CL 103 100  CO2 20 15*  GLUCOSE 224* 193*  BUN 19 25*  CREATININE 1.00 1.15*  CALCIUM 9.8 9.9   Liver Function Tests:  Recent Labs Lab 03/21/14 0249  AST 24  ALT 10  ALKPHOS 67  BILITOT 1.5*  PROT 8.2  ALBUMIN 3.1*    Recent Labs Lab 03/21/14 0249  LIPASE 26    Recent Labs Lab  03/21/14 1205  AMMONIA 24   CBC:  Recent Labs Lab 03/21/14 0249  03/21/14 1855 03/22/14 0315  WBC 6.2  --   --  4.6  NEUTROABS 4.7  --   --   --   HGB 7.8*  < > 9.1* 8.6*  HCT 27.3*  < > 30.7* 29.9*  MCV 74.0*  --   --  74.9*  PLT 239  --   --  244  < > = values in this interval not displayed. Cardiac Enzymes:  Recent Labs Lab 03/21/14 1200 03/21/14 1855 03/21/14 2200  TROPONINI <0.30 <0.30 <0.30   BNP: No components found with this basename: POCBNP,  CBG: No results found for this basename: GLUCAP,  in the last 168 hours   Micro Results: Recent Results (from the past 240 hour(s))  MRSA PCR SCREENING     Status: None   Collection Time    03/21/14  8:59 AM      Result Value Ref Range Status   MRSA by PCR NEGATIVE  NEGATIVE Final   Comment:            The GeneXpert MRSA Assay (FDA     approved for NASAL specimens     only), is one component of a     comprehensive MRSA colonization     surveillance program. It is not     intended to diagnose MRSA     infection nor to guide or     monitor treatment for     MRSA infections.    Studies/Results: Ct Head Wo Contrast  03/21/2014   CLINICAL DATA:  Mental status changes  EXAM: CT HEAD WITHOUT CONTRAST  TECHNIQUE: Contiguous axial images were obtained from the base of the skull through the vertex without intravenous contrast.  COMPARISON:  Head CT 01/10/2014  FINDINGS: Encephalomalacia change again appreciated in a left MCA distribution involving the left temporal, frontal and parietal lobes, consistent with chronic infarction. There is left lateral ventricular hydrocephalus ex vacuo. No further evidence of extra-axial fluid collections nor acute hemorrhage. No mass effect. The calvarium is negative. The visualized paranasal sinuses and mastoid air cells are patent.  IMPRESSION: No acute intracranial abnormality.  Stable chronic changes.   Electronically Signed   By: Salome Holmes M.D.   On: 03/21/2014 13:34   Ct Abdomen  Pelvis W Contrast  03/21/2014   CLINICAL DATA:  Abdominal pain, too lethargic to tolerate oral contrast  EXAM: CT ABDOMEN AND PELVIS WITH CONTRAST  TECHNIQUE: Multidetector CT imaging of the abdomen and pelvis was performed using the standard protocol following bolus administration of intravenous contrast.  CONTRAST:  100 mL Omnipaque 300  COMPARISON:  None.  FINDINGS: Severe multichamber cardiomegaly. Very small right pleural effusion.  The liver, spleen, adrenals, pancreas, right kidney are unremarkable. Small stable benign-appearing cyst left kidney otherwise unremarkable.  An inferior vena caval filter appreciated below the renal veins. There is no abdominal aortic aneurysm. Atherosclerotic calcifications are appreciated within the aorta and iliac vessels.  No abdominal pelvic masses, or loculated  fluid collections. Trace amount of ascites along the inferior border of the liver. Small amount of free fluid within the dependent portion of pelvis.  The bowel and appendix are negative, patient status post cholecystectomy.  No abdominal wall nor inguinal hernia.  No aggressive appearing osseous lesions.  IMPRESSION: Mild area is of ascites along the inferior border of the liver and deep in the portion of the pelvis. These findings are nonspecific. There is otherwise no CT evidence of obstructive or inflammatory abnormalities.  Multichamber cardiomegaly  Small right pleural effusion   Electronically Signed   By: Salome HolmesHector  Cooper M.D.   On: 03/21/2014 13:52   Dg Abd Acute W/chest  03/21/2014   CLINICAL DATA:  Abdominal pain for 3 days  EXAM: ACUTE ABDOMEN SERIES (ABDOMEN 2 VIEW & CHEST 1 VIEW)  COMPARISON:  02/11/2014 chest radiograph  FINDINGS: Dual-chamber left approach ICD/ pacer leads have unchanged orientation. There is chronic, marked cardiopericardial enlargement. There is bronchial cuffing, cephalized blood flow, and Kerley lines. The retrocardiac lung is obscured by the heart.  The bowel gas pattern is  nonobstructed. Cholecystectomy clips are noted. The liver shadow is prominent, possibly hepatic congestion. No evidence for urolithiasis. No pneumoperitoneum.  IMPRESSION: 1. CHF. 2. Negative abdominal radiographs.   Electronically Signed   By: Tiburcio PeaJonathan  Watts M.D.   On: 03/21/2014 03:33    Medications: Scheduled Meds: . digoxin  0.125 mg Oral Daily  . escitalopram  20 mg Oral Daily  . furosemide  40 mg Intravenous Q12H  . insulin aspart  0-5 Units Subcutaneous QHS  . insulin aspart  0-9 Units Subcutaneous TID WC  . ipratropium-albuterol  3 mL Nebulization TID  . levETIRAcetam  2,000 mg Oral BID  . levothyroxine  50 mcg Oral QAC breakfast  . metoprolol tartrate  25 mg Oral BID  . pantoprazole (PROTONIX) IV  40 mg Intravenous Q12H  . QUEtiapine Fumarate  150 mg Oral QHS  . sodium chloride  3 mL Intravenous Q12H  . sodium chloride  3 mL Intravenous Q12H      LOS: 1 day   Markiesha Delia M.D. [facility] 03/22/2014, 10:17 AM Pager: [phone]  If 7PM-7AM, please contact night-coverage [email or web address] Password [email or web address]  **Disclaimer: This note was dictated with voice recognition software. Similar sounding words can inadvertently be transcribed and this note may contain transcription errors which may not have been corrected upon publication of note.**

## 2014-03-22 NOTE — Progress Notes (Signed)
Pt had 9 beat run of vtach.  She was resting comfortably when RN entered room.  Rhythm had returned to SR with occasional to frequent pvc's with a HR of 78, BP 11163. Pt denies pain/sob. MD notified of same.  Will continue to monitor.

## 2014-03-22 NOTE — Consult Note (Signed)
[facility] Consult: 10:18 AM 03/22/2014  LOS: 1 day    Referring Provider: Dr   Primary Care Physician:  [name], [name], MD Primary Gastroenterologist:  none     Reason for Consultation: Anemia, possibly iron deficient   HPI: Elizabeth Love is a 58 y.o. female.  CVA in 2007 with right side weakness and expressive aphasia, panic/agitation issues.  Systolic/diastolic heart failure. Afib with hx RVR.  Hx DVT.  S/p IVC filter and on Xarelto. S/p ICD.  DM2. Anemia.   Baseline function is limited mobility with cane.  Not very verbal but has limited speech.     [facility] (lives with [name] [phone]) yesterday with agitation (screaming) and acute onset abdomiinal pain.  Brother says she was pointing to left abdomen as pain locus.  No diarrhea, no nausea.  No previous belly pain. Sxs began at midnight. Notes say abd pain an issue for 3 days, but brotherconfirmed more of a 24 hour picture.   Apparently was not compliant with meds including Lasix and Xanax (ran out)  Labs show anemia:  HGB 7.8, with MCV 74.  Baseline Hgb Jan - April 2015: 8.5 to 9.3 with outlier of 12.6 late May 2015. Transfused with PRBCs to 8.6. Stool not FOBT tested.  LFTs unremarkable except T bili of 1.5.  Urine is negative.  + AKI .  BNP is 11633.  Contrasted CT scan with mild ascites at inferior liver margin.   After repeated doses of Ativan for Agitation yesterday, required Bipap for resp support but now off this and a bit agitated.  By exam and radiology she is volume overloaded.  Neurology and cardiology involved.  Head CT negtive for acute changes.   Previously no problems with diarrhea or bloody stools.  Has Miralax to use if constipated.  Brother says she has daily, formed stools.      Past Medical History  Diagnosis Date  . Seizures   . Hypertension   . Stroke     2007 - R sided weakness and expressive aphasia with h/o behavioral problems related to this  . Asthma   . Diabetes mellitus   . Chronic systolic CHF (congestive heart failure)   . Atrial fibrillation     a. On Xarelto after having DVT (prev felt to be poor anticoag cand 2/2 noncompliance).  . History of DVT (deep vein thrombosis)     a. 06/2012: RLE DVT, + protein C deficiency, placed on Xarelto. s/p IVC filter  . Noncompliance     Due to mental status, family has had to coax her to take medicines  . Hypothyroidism   . VT (ventricular tachycardia)     a. h/o ICD ~2007-2008. b. Adm 08/2012 with UTI, had VT/ICD shock x 5 (hypokalemic);  c. 06/2013 s/p ICD Gen change: SJM DC ICD ser #: [id number]  . Protein C deficiency   . NICM (nonischemic cardiomyopathy)     a. s/p AICD ~2008 ([employer]) - previous care [place]. No known hx  CAD. b. EF 10% by echo 04/2012;  c. 06/2013 s/p ICD Gen change: SJM DC ICD ser #: [id number]  . 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 Panic attacks     Past Surgical History  Procedure Laterality Date  . Cholecystectomy    . Appendectomy    . Pacemaker insertion      [employer]  . Vena cava filter placement  07/19/2012    Procedure: INSERTION VENA-CAVA FILTER;  Surgeon: Larina Earthly, MD;  Location: [facility] OR;  Service: Vascular;  Laterality: [name];  . Insert / replace / remove pacemaker  2014    ICD. [employer] Jude; inserted 2008 [facility]); gen change 2014 SK  . Abdominal hysterectomy      Prior to Admission medications   Medication Sig Start Date End Date Taking? Authorizing Provider  acetaminophen (TYLENOL) 325 MG tablet Take 650 mg by mouth every 6 (six) hours as needed for pain.    Historical Provider, MD  albuterol (PROVENTIL) (2.5 MG/3ML) 0.083% nebulizer solution Take 2.5 mg by nebulization every 6 (six) hours as needed. For cough or wheeze    Historical Provider, MD   ALPRAZolam Prudy Feeler) 0.5 MG tablet Take 0.5 mg by mouth at bedtime as needed for sleep.    Historical Provider, MD  ALPRAZolam Prudy Feeler) 1 MG tablet Take 1 tablet (1 mg total) by mouth at bedtime as needed for anxiety. 01/10/14   Rolland Porter, MD  amiodarone (PACERONE) 200 MG tablet Take 1 tablet daily except none on weekend confirmed with family member 11/24/2013 11/24/13   Duke Salvia, MD  digoxin (LANOXIN) 0.125 MG tablet Take 1 tablet (0.125 mg total) by mouth daily. 09/01/13   Marinda Elk, MD  escitalopram (LEXAPRO) 20 MG tablet Take 1 tablet (20 mg total) by mouth daily. 09/01/13   Marinda Elk, MD  furosemide (LASIX) 40 MG tablet Take 40 mg by mouth 2 (two) times daily.    Historical Provider, MD  insulin lispro (HUMALOG) 100 UNIT/ML injection Inject into the skin 3 (three) times daily before meals. On sliding scale    Historical Provider, MD  levETIRAcetam (KEPPRA) 1000 MG tablet Take 2,000 mg by mouth 2 (two) times daily.    Historical Provider, MD  levothyroxine (SYNTHROID, LEVOTHROID) 50 MCG tablet Take 50 mcg by mouth daily before breakfast.    Historical Provider, MD  lisinopril (PRINIVIL,ZESTRIL) 5 MG tablet Take 1 tablet (5 mg total) by mouth daily. 12/17/13   Amy D Clegg, NP  metolazone (ZAROXOLYN) 2.5 MG tablet TAKE 1 TABLET BY MOUTH AS NEEDED. FOR FLUID RETENTION 12/25/13   Dolores Patty, MD  metoprolol tartrate (LOPRESSOR) 25 MG tablet Take 1 tablet (25 mg total) by mouth 2 (two) times daily. 09/01/13   Marinda Elk, MD  omeprazole (PRILOSEC) 20 MG capsule Take 20 mg by mouth 2 (two) times daily.    Historical Provider, MD  oxyCODONE-acetaminophen (PERCOCET) 10-325 MG per tablet Take 1 tablet by mouth every 8 (eight) hours as needed for pain.    Historical Provider, MD  polyethylene glycol (MIRALAX / GLYCOLAX) packet Take 17 g by mouth daily as needed. For constipation    Historical Provider, MD  potassium chloride 20 MEQ/15ML (10%) solution Take 15 mLs (20 mEq  total) by mouth daily. 09/01/13   Marinda Elk, MD  QUEtiapine Fumarate (SEROQUEL XR) 150 MG 24 hr tablet Take 1 tablet (150 mg total) by mouth at bedtime. 09/01/13   Marinda Elk,  MD  Rivaroxaban (XARELTO) 20 MG TABS tablet Take 20 mg by mouth daily.    Historical Provider, MD  spironolactone (ALDACTONE) 25 MG tablet Take 25 mg by mouth daily.    Historical Provider, MD  Vitamin D, Ergocalciferol, (DRISDOL) 50000 UNITS CAPS capsule Take 50,000 Units by mouth every 30 (thirty) days.    Historical Provider, MD    Scheduled Meds: . digoxin  0.125 mg Oral Daily  . escitalopram  20 mg Oral Daily  . furosemide  40 mg Intravenous Q12H  . insulin aspart  0-5 Units Subcutaneous QHS  . insulin aspart  0-9 Units Subcutaneous TID WC  . ipratropium-albuterol  3 mL Nebulization TID  . levETIRAcetam  2,000 mg Oral BID  . levothyroxine  50 mcg Oral QAC breakfast  . metoprolol tartrate  25 mg Oral BID  . pantoprazole (PROTONIX) IV  40 mg Intravenous Q12H  . QUEtiapine Fumarate  150 mg Oral QHS  . sodium chloride  3 mL Intravenous Q12H  . sodium chloride  3 mL Intravenous Q12H   Infusions:   PRN Meds: sodium chloride, sodium chloride, acetaminophen, acetaminophen, albuterol, LORazepam, ondansetron (ZOFRAN) IV, ondansetron, oxyCODONE, oxyCODONE-acetaminophen, polyethylene glycol, sodium chloride, sodium chloride   Allergies as of 03/21/2014 - Review Complete 03/21/2014  Allergen Reaction Noted  . Penicillins Rash 04/22/2012  . Sulfa antibiotics Rash 04/22/2012    Family History  Problem Relation Age of Onset  . Hypertension Mother   . Coronary artery disease Neg Hx     History   Social History  . Marital Status: Single    Spouse Name: [name]    Number of Children: [name]  . Years of Education: [name]   Occupational History  . Not on file.   Social History Main Topics  . Smoking status: Former Games developer  . Smokeless tobacco: Never Used  . Alcohol Use: No  . Drug Use: No  .  Sexual Activity: No   Other Topics Concern  . Not on file   Social History Narrative   ** Merged History Encounter **        REVIEW OF SYSTEMS: Only obtaineable from notes   PHYSICAL EXAM: Vital signs in last 24 hours: Filed Vitals:   03/22/14 0752  BP: 121/77  Pulse: 96  Temp: 98 F (36.7 C)  Resp: 37   Wt Readings from Last 3 Encounters:  03/22/14 53.6 kg (118 lb 2.7 oz)  02/11/14 52.164 kg (115 lb)  12/17/13 51.619 kg (113 lb 12.8 oz)    General: thin, chronically ill looking AAF who is emotionally labile Head:  No asymmetry or swelling  Eyes:  No icterus or pallor Ears:  No obviious HOH  Nose:  Not congested Mouth:  Clear, moist, tongue midline Neck:  No mass, no bruit. No JVD Lungs:  Clear BS bil.  No labored breathing Heart: Sinus rhythm with PVCs on tele monitor.  No MRG Abdomen:  Soft, tender diffusely > on left.  No guard or rebound.  No HSM.  No bruits.  No masses.   Rectal: formed brown stool in vault is FOBT negative.  No rectal masses.  Tone is normal   Musc/Skeltl: no joint swelling.  Some contractures in hands/arms but not fixed.  Extremities:  No CCE Neurologic:  Alert, aphasic but speech is difficult to understand.  Able to point to locations of pain in head and abdomen appropriately. Right-sided hemiparesis Skin:  No telangectasias, sores or rash Tattoos:  none Nodes:  No cervical adenopathy  Psych:  Emotionally labile: nearly cried a few times. Agitated but will calm with staff reassurance.   Intake/Output from previous day: 07/04 0701 - 07/05 0700 In: 465.5 [I.V.:145.5; Blood:320] Out: 1200 [Urine:1200] Intake/Output this shift: Total I/O In: 460 [P.O.:400; I.V.:60] Out: 0   LAB RESULTS:  Recent Labs  03/21/14 0249 03/21/14 1205 03/21/14 1855 03/22/14 0315  WBC 6.2  --   --  4.6  HGB 7.8* 8.1* 9.1* 8.6*  HCT 27.3* 29.3* 30.7* 29.9*  PLT 239  --   --  244  MCV                                                     74.9    Ref.  Range 03/21/2014 12:05  Iron Latest Range: 42-135 ug/dL 16 (L)  UIBC Latest Range: 125-400 ug/dL 161546 (H)  TIBC Latest Range: 250-470 ug/dL 096562 (H)  Saturation Ratios Latest Range: 20-55 % 3 (L)  Ferritin Latest Range: 10-291 ng/mL 26  Folate No range found 11.9    BMET Lab Results  Component Value Date   NA 138 03/22/2014   NA 138 03/21/2014   NA 140 02/11/2014   K 4.8 03/22/2014   K 4.1 03/21/2014   K 4.8 02/11/2014   CL 100 03/22/2014   CL 103 03/21/2014   CL 101 02/11/2014   CO2 15* 03/22/2014   CO2 20 03/21/2014   CO2 22 01/10/2014   GLUCOSE 193* 03/22/2014   GLUCOSE 224* 03/21/2014   GLUCOSE 166* 02/11/2014   BUN 25* 03/22/2014   BUN 19 03/21/2014   BUN 23 02/11/2014   CREATININE 1.15* 03/22/2014   CREATININE 1.00 03/21/2014   CREATININE 1.00 02/11/2014   CALCIUM 9.9 03/22/2014   CALCIUM 9.8 03/21/2014   CALCIUM 9.2 01/10/2014   LFT  Recent Labs  03/21/14 0249  PROT 8.2  ALBUMIN 3.1*  AST 24  ALT 10  ALKPHOS 67  BILITOT 1.5*   PT/INR Lab Results  Component Value Date   INR 2.50* 03/21/2014   Hepatitis Panel No results found for this basename: HEPBSAG, HCVAB, HEPAIGM, HEPBIGM,  in the last 72 hours C-Diff No components found with this basename: cdiff   Lipase     Component Value Date/Time   LIPASE 26 03/21/2014 0249    Drugs of Abuse     Component Value Date/Time   LABOPIA NONE DETECTED 03/21/2014 1149   LABOPIA NEGATIVE 07/21/2013 1217   COCAINSCRNUR NONE DETECTED 03/21/2014 1149   COCAINSCRNUR NEGATIVE 07/21/2013 1217   LABBENZ NONE DETECTED 03/21/2014 1149   LABBENZ NEGATIVE 07/21/2013 1217   AMPHETMU NONE DETECTED 03/21/2014 1149   AMPHETMU NEGATIVE 07/21/2013 1217   THCU NONE DETECTED 03/21/2014 1149   LABBARB NONE DETECTED 03/21/2014 1149     RADIOLOGY STUDIES: Ct Head Wo Contrast 03/21/2014   COMPARISON:  Head CT 01/10/2014  FINDINGS: Encephalomalacia change again appreciated in a left MCA distribution involving the left temporal, frontal and parietal lobes, consistent with chronic  infarction. There is left lateral ventricular hydrocephalus ex vacuo. No further evidence of extra-axial fluid collections nor acute hemorrhage. No mass effect. The calvarium is negative. The visualized paranasal sinuses and mastoid air cells are patent.  IMPRESSION: No acute intracranial abnormality.  Stable chronic changes.   Electronically Signed   By: Salome HolmesHector  Cooper M.D.   On: 03/21/2014 13:34  Ct Abdomen Pelvis W Contrast 03/21/2014    COMPARISON:  None.  FINDINGS: Severe multichamber cardiomegaly. Very small right pleural effusion.  The liver, spleen, adrenals, pancreas, right kidney are unremarkable. Small stable benign-appearing cyst left kidney otherwise unremarkable.  An inferior vena caval filter appreciated below the renal veins. There is no abdominal aortic aneurysm. Atherosclerotic calcifications are appreciated within the aorta and iliac vessels.  No abdominal pelvic masses, or loculated fluid collections. Trace amount of ascites along the inferior border of the liver. Small amount of free fluid within the dependent portion of pelvis.  The bowel and appendix are negative, patient status post cholecystectomy.  No abdominal wall nor inguinal hernia.  No aggressive appearing osseous lesions.  IMPRESSION: Mild area is of ascites along the inferior border of the liver and deep in the portion of the pelvis. These findings are nonspecific. There is otherwise no CT evidence of obstructive or inflammatory abnormalities.  Multichamber cardiomegaly  Small right pleural effusion   Electronically Signed   By: Salome Holmes M.D.   On: 03/21/2014 13:52   Dg Abd Acute W/chest 03/21/2014   CLINICAL DATA:  Abdominal pain for 3 days  EXAM: ACUTE ABDOMEN SERIES (ABDOMEN 2 VIEW & CHEST 1 VIEW)  COMPARISON:  02/11/2014 chest radiograph  FINDINGS: Dual-chamber left approach ICD/ pacer leads have unchanged orientation. There is chronic, marked cardiopericardial enlargement. There is bronchial cuffing, cephalized blood  flow, and Kerley lines. The retrocardiac lung is obscured by the heart.  The bowel gas pattern is nonobstructed. Cholecystectomy clips are noted. The liver shadow is prominent, possibly hepatic congestion. No evidence for urolithiasis. No pneumoperitoneum.  IMPRESSION: 1. CHF. 2. Negative abdominal radiographs.   Electronically Signed   By: Tiburcio Pea M.D.   On: 03/21/2014 03:33    ENDOSCOPIC STUDIES: None in record and pt's brother does not recall EGD or colonoscopy  IMPRESSION:   *  Abdominal pain.  CT abdomen and labs unremarkable as to etiology.  Wonder if there is some relative intestinal ischemia/hypoperfusion in setting of acute heart failure.  *  Acute heart failure  *  Microcytic anemia.  FOBT negative.  No hx to suggest GI bleed.  S/p one unit PRBC 7/4.   *  Agitation.  Worse but not a new problem, worse as she ran out of Ativan at home.   PLAN:     *  Per Dr Marina Goodell.    Jennye Moccasin  03/22/2014, 10:18 AM Pager: [phone]  GI ATTENDING  History, laboratories, x-rays reviewed. Patient personally seen and examined. Incredibly complicated patient. Agree with H&P as outlined above. She has chronic microcytic anemia. No history of GI bleeding. Stool is Hemoccult negative. No colonic mass on CT or obvious upper GI mucosal abnormality. Cannot rule out occult source of chronic GI blood loss to account for anemia. Suspect there is a component of chronic disease. Unfortunately, she's not an appropriate candidate for any endoscopic evaluations, short of somewhat heroic therapeutic endoscopy for acute bleeding. Given these limitations, recommend lifelong daily PPI to protect the upper GI tract mucosa. At this time, transfuse to desired hemoglobin. Long-term chronic iron therapy to address any component of iron deficiency... Please contact us if you have any questions or problems. Will sign off.  Wilhemina Bonito. Eda Keys., M.D. [facility] Division of Gastroenterology

## 2014-03-22 NOTE — Consult Note (Signed)
CARDIOLOGY CONSULT NOTE   Patient ID: LACONYA CLERE MRN: [id number] DOB/AGE: 1956-05-24 58 y.o.  Admit date: 03/21/2014  Primary Physician   [name], [name], MD Primary Cardiologist   DB (CHF)/SK Reason for Consultation  CHF, atrial fib and anticoagulation  WUJ:WJXBJYN Judie Petit Klumb is a 58 y.o. female with a history of NICM and CHF. She has had VT and a St Jude ICD was inserted. Her EF was 20% in 2014. She was admitted 07/04 with acute encephalopathy and is being followed by Neurology. She was also anemic (H&H 7.8/27.3), requiring transfusion, with no cause immediately apparent. She is volume overloaded, with CHF and has atrial fibrillation. She was on Xarelto for anticoagulation. Her current echo results are below. Cardiology was asked to evaluate her.  Ms Chuong is unable to give much information. She give a weight on 116 lbs, but cannot say if this is her dry weight or not. She does not answer questions very well, unable to determine the sequence of her symptoms. She does say her stools have been a little black recently. She became briefly agitated during the exam, seemed to think use of the stethoscope would be painful, but calmed down. She is not obviously SOB at rest, on oxygen.   Past Medical History  Diagnosis Date  . Seizures   . Hypertension   . Stroke     2007 - R sided weakness and expressive aphasia with h/o behavioral problems related to this  . Asthma   . Diabetes mellitus   . Chronic systolic CHF (congestive heart failure)   . Atrial fibrillation     a. On Xarelto after having DVT (prev felt to be poor anticoag cand 2/2 noncompliance).  . History of DVT (deep vein thrombosis)     a. 06/2012: RLE DVT, + protein C deficiency, placed on Xarelto. s/p IVC filter  . Noncompliance     Due to mental status, family has had to coax her to take medicines  . Hypothyroidism   . VT (ventricular tachycardia)     a. h/o ICD ~2007-2008. b. Adm 08/2012 with UTI, had VT/ICD  shock x 5 (hypokalemic);  c. 06/2013 s/p ICD Gen change: SJM DC ICD ser #: [id number]  . Protein C deficiency   . NICM (nonischemic cardiomyopathy)     a. s/p AICD ~2008 (St. Jude) - previous care [place]. No known hx CAD. b. EF 10% by echo 04/2012;  c. 06/2013 s/p ICD Gen change: SJM DC ICD ser #: [id number]  . 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 Panic attacks     Past Surgical History  Procedure Laterality Date  . Cholecystectomy    . Appendectomy    . Pacemaker insertion      St. Judes  . Vena cava filter placement  07/19/2012    Procedure: INSERTION VENA-CAVA FILTER;  Surgeon: Larina Earthly, MD;  Location: [facility] OR;  Service: Vascular;  Laterality: [name];  . Insert / replace / remove pacemaker  2014    ICD. St. Jude; inserted 2008 [facility]); gen change 2014 SK  . Abdominal hysterectomy     Allergies  Allergen Reactions  . Penicillins Rash  . Sulfa Antibiotics Rash   I have reviewed the patient's current medications . digoxin  0.125 mg Oral Daily  . escitalopram  20 mg Oral Daily  . furosemide  40 mg Intravenous Q12H  . insulin aspart  0-5 Units Subcutaneous QHS  .  insulin aspart  0-9 Units Subcutaneous TID WC  . ipratropium-albuterol  3 mL Nebulization TID  . levETIRAcetam  2,000 mg Oral BID  . levothyroxine  50 mcg Oral QAC breakfast  . metoprolol tartrate  25 mg Oral BID  . pantoprazole (PROTONIX) IV  40 mg Intravenous Q12H  . QUEtiapine Fumarate  150 mg Oral QHS  . sodium chloride  3 mL Intravenous Q12H  . sodium chloride  3 mL Intravenous Q12H     sodium chloride, sodium chloride, acetaminophen, acetaminophen, albuterol, LORazepam, ondansetron (ZOFRAN) IV, ondansetron, oxyCODONE, oxyCODONE-acetaminophen, polyethylene glycol, sodium chloride, sodium chloride  Medication Sig  acetaminophen (TYLENOL) 325 MG tablet Take 650 mg by mouth every 6 (six) hours as needed for pain.  albuterol (PROVENTIL) (2.5 MG/3ML) 0.083% nebulizer solution Take 2.5 mg by nebulization every  6 (six) hours as needed. For cough or wheeze  ALPRAZolam (XANAX) 0.5 MG tablet Take 0.5 mg by mouth at bedtime as needed for sleep.  ALPRAZolam (XANAX) 1 MG tablet Take 1 tablet (1 mg total) by mouth at bedtime as needed for anxiety.  amiodarone (PACERONE) 200 MG tablet Take 1 tablet daily except none on weekend confirmed with family member 11/24/2013  digoxin (LANOXIN) 0.125 MG tablet Take 1 tablet (0.125 mg total) by mouth daily.  escitalopram (LEXAPRO) 20 MG tablet Take 1 tablet (20 mg total) by mouth daily.  furosemide (LASIX) 40 MG tablet Take 40 mg by mouth 2 (two) times daily.  insulin lispro (HUMALOG) 100 UNIT/ML injection Inject into the skin 3 (three) times daily before meals. On sliding scale  levETIRAcetam (KEPPRA) 1000 MG tablet Take 2,000 mg by mouth 2 (two) times daily.  levothyroxine (SYNTHROID, LEVOTHROID) 50 MCG tablet Take 50 mcg by mouth daily before breakfast.  lisinopril (PRINIVIL,ZESTRIL) 5 MG tablet Take 1 tablet (5 mg total) by mouth daily.  metolazone (ZAROXOLYN) 2.5 MG tablet TAKE 1 TABLET BY MOUTH AS NEEDED. FOR FLUID RETENTION  metoprolol tartrate (LOPRESSOR) 25 MG tablet Take 1 tablet (25 mg total) by mouth 2 (two) times daily.  omeprazole (PRILOSEC) 20 MG capsule Take 20 mg by mouth 2 (two) times daily.  oxyCODONE-acetaminophen (PERCOCET) 10-325 MG per tablet Take 1 tablet by mouth every 8 (eight) hours as needed for pain.  polyethylene glycol (MIRALAX / GLYCOLAX) packet Take 17 g by mouth daily as needed. For constipation  potassium chloride 20 MEQ/15ML (10%) solution Take 15 mLs (20 mEq total) by mouth daily.  QUEtiapine Fumarate (SEROQUEL XR) 150 MG 24 hr tablet Take 1 tablet (150 mg total) by mouth at bedtime.  Rivaroxaban (XARELTO) 20 MG TABS tablet Take 20 mg by mouth daily.  spironolactone (ALDACTONE) 25 MG tablet Take 25 mg by mouth daily.  Vitamin D, Ergocalciferol, (DRISDOL) 50000 UNITS CAPS capsule Take 50,000 Units by mouth every 30 (thirty) days.       History   Social History  . Marital Status: Single    Spouse Name: [name]    Number of Children: [name]  . Years of Education: [name]   Occupational History  . Not employed    Social History Main Topics  . Smoking status: Former Games developer  . Smokeless tobacco: Never Used  . Alcohol Use: No  . Drug Use: No  . Sexual Activity: No   Other Topics Concern  . Not on file   Social History Narrative   ** Merged History Encounter **   Pt lives with her brother.        Family Status  Relation Status Death  Age  . Mother Alive    Family History  Problem Relation Age of Onset  . Hypertension Mother   . Coronary artery disease Neg Hx      ROS:  Full 14 point review of systems complete and found to be negative unless listed above.  Physical Exam: Blood pressure 108/57, pulse 99, temperature 97.4 F (36.3 C), temperature source Oral, resp. rate 27, height 5\' 2"  (1.575 m), weight 118 lb 2.7 oz (53.6 kg), SpO2 98.00%.  General: Well developed, well nourished, slender female in no acute distress Head: Eyes PERRLA, No xanthomas.   Normocephalic and atraumatic, oropharynx without edema or exudate. Dentition: good Lungs: decreased BS bases, rales noted Heart: HRRR S1 S2, no rub/gallop, no murmur. pulses are 2+ all 4 extrem.   Neck: No carotid bruits. No lymphadenopathy.  JVD at 10 cm. Abdomen: Bowel sounds present, abdomen soft and very tender, worse in the RUQ without masses or hernias noted. Msk:  No spine or cva tenderness. No weakness, no joint deformities or effusions. Extremities: No clubbing or cyanosis. No edema.  Neuro: Alert and oriented X 1.  Psych:  Anxious affect Skin: No rashes or lesions noted.  Labs:   Lab Results  Component Value Date   WBC 4.6 03/22/2014   HGB 8.8* 03/22/2014   HCT 29.6* 03/22/2014   MCV 74.9* 03/22/2014   PLT 244 03/22/2014    Recent Labs  03/21/14 0633  INR 2.50*     Recent Labs Lab 03/21/14 0249 03/22/14 0315  NA 138 138  K 4.1 4.8  CL 103 100   CO2 20 15*  BUN 19 25*  CREATININE 1.00 1.15*  CALCIUM 9.8 9.9  PROT 8.2  --   BILITOT 1.5*  --   ALKPHOS 67  --   ALT 10  --   AST 24  --   GLUCOSE 224* 193*  ALBUMIN 3.1*  --     Recent Labs  03/21/14 0249 03/21/14 1200 03/21/14 1855 03/21/14 2200  TROPONINI <0.30 <0.30 <0.30 <0.30   Pro B Natriuretic peptide (BNP)  Date/Time Value Ref Range Status  03/21/2014  6:33 AM 11633.0* 0 - 125 pg/mL Final  01/10/2014  9:12 AM 7500.0* 0 - 125 pg/mL Final   Lab Results  Component Value Date   DIGOXIN <0.3* 10/12/2013   Lipase  Date/Time Value Ref Range Status  03/21/2014  2:49 AM 26  11 - 59 U/L Final   TSH  Date/Time Value Ref Range Status  03/21/2014 12:00 PM 1.850  0.350 - 4.500 uIU/mL Final   Vitamin B-12  Date/Time Value Ref Range Status  03/21/2014 12:05 PM 829  211 - 911 pg/mL Final     Performed at [facility]     Folate  Date/Time Value Ref Range Status  03/21/2014 12:05 PM 11.9   Final     (NOTE)     Reference Ranges            Deficient:       0.4 - 3.3 ng/mL            Indeterminate:   3.4 - 5.4 ng/mL            Normal:              > 5.4 ng/mL     Performed at [facility]     Ferritin  Date/Time Value Ref Range Status  03/21/2014 12:05 PM 26  10 - 291 ng/mL Final  [facility]     TIBC  Date/Time Value Ref Range Status  03/21/2014 12:05 PM 562* 250 - 470 ug/dL Final     Iron  Date/Time Value Ref Range Status  03/21/2014 12:05 PM 16* 42 - 135 ug/dL Final     Retic Ct Pct  Date/Time Value Ref Range Status  03/21/2014 12:05 PM 2.1  0.4 - 3.1 % Final    Echo: 03/21/2014 Study Conclusions - Left ventricle: The cavity size was moderately dilated. Wall thickness was normal. The estimated ejection fraction was 15%. Diffuse hypokinesis. Doppler parameters are consistent with restrictive physiology, indicative of decreased left ventricular diastolic compliance and/or increased left atrial pressure. Doppler  parameters are consistent with high ventricular filling pressure. - Mitral valve: There was severe regurgitation. - Left atrium: The atrium was severely dilated. - Right ventricle: The cavity size was mildly dilated. - Right atrium: The atrium was mildly dilated. - Tricuspid valve: There was moderate-severe regurgitation. - Pulmonary arteries: Systolic pressure was moderately increased. PA peak pressure: 56 mm Hg (S). - Pericardium, extracardiac: A trivial pericardial effusion was identified. Impressions: - Severe global reduction in LV function; restrictive filling; 4 chamber enlargement; severe MR; moderate to severe TR; moderately elevated pulmonary pressures.   ECG: 03/21/2014 SR, inc LBBB is old Vent. rate 91 BPM PR interval 194 ms QRS duration 120 ms QT/QTc 414/509 ms P-R-T axes 69 63 65  Radiology:  Ct Head Wo Contrast 03/21/2014   CLINICAL DATA:  Mental status changes  EXAM: CT HEAD WITHOUT CONTRAST  TECHNIQUE: Contiguous axial images were obtained from the base of the skull through the vertex without intravenous contrast.  COMPARISON:  Head CT 01/10/2014  FINDINGS: Encephalomalacia change again appreciated in a left MCA distribution involving the left temporal, frontal and parietal lobes, consistent with chronic infarction. There is left lateral ventricular hydrocephalus ex vacuo. No further evidence of extra-axial fluid collections nor acute hemorrhage. No mass effect. The calvarium is negative. The visualized paranasal sinuses and mastoid air cells are patent.  IMPRESSION: No acute intracranial abnormality.  Stable chronic changes.   Electronically Signed   By: Salome HolmesHector  Cooper M.D.   On: 03/21/2014 13:34   Ct Abdomen Pelvis W Contrast 03/21/2014   CLINICAL DATA:  Abdominal pain, too lethargic to tolerate oral contrast  EXAM: CT ABDOMEN AND PELVIS WITH CONTRAST  TECHNIQUE: Multidetector CT imaging of the abdomen and pelvis was performed using the standard protocol following bolus  administration of intravenous contrast.  CONTRAST:  100 mL Omnipaque 300  COMPARISON:  None.  FINDINGS: Severe multichamber cardiomegaly. Very small right pleural effusion.  The liver, spleen, adrenals, pancreas, right kidney are unremarkable. Small stable benign-appearing cyst left kidney otherwise unremarkable.  An inferior vena caval filter appreciated below the renal veins. There is no abdominal aortic aneurysm. Atherosclerotic calcifications are appreciated within the aorta and iliac vessels.  No abdominal pelvic masses, or loculated fluid collections. Trace amount of ascites along the inferior border of the liver. Small amount of free fluid within the dependent portion of pelvis.  The bowel and appendix are negative, patient status post cholecystectomy.  No abdominal wall nor inguinal hernia.  No aggressive appearing osseous lesions.  IMPRESSION: Mild area is of ascites along the inferior border of the liver and deep in the portion of the pelvis. These findings are nonspecific. There is otherwise no CT evidence of obstructive or inflammatory abnormalities.  Multichamber cardiomegaly  Small right pleural effusion   Electronically Signed   By: Oswaldo DoneHector  Excell Seltzer M.D.   On: 03/21/2014 13:52   Dg Abd Acute W/chest 03/21/2014   CLINICAL DATA:  Abdominal pain for 3 days  EXAM: ACUTE ABDOMEN SERIES (ABDOMEN 2 VIEW & CHEST 1 VIEW)  COMPARISON:  02/11/2014 chest radiograph  FINDINGS: Dual-chamber left approach ICD/ pacer leads have unchanged orientation. There is chronic, marked cardiopericardial enlargement. There is bronchial cuffing, cephalized blood flow, and Kerley lines. The retrocardiac lung is obscured by the heart.  The bowel gas pattern is nonobstructed. Cholecystectomy clips are noted. The liver shadow is prominent, possibly hepatic congestion. No evidence for urolithiasis. No pneumoperitoneum.  IMPRESSION: 1. CHF. 2. Negative abdominal radiographs.   Electronically Signed   By: Tiburcio Pea M.D.   On:  03/21/2014 03:33    ASSESSMENT AND PLAN:   The patient was seen today by Dr. Anne Fu, the patient evaluated and the data reviewed.  Principal Problem:   Acute encephalopathy - per IM/Neuro, admitted with agitation, had not been taking her Keppra  Active Problems:   Chronic pain - per IM    DM type 2 (diabetes mellitus, type 2) - Per IM    Automatic implantable cardiac defibrillator St. Jude - PVCs and pairs on telemetry, MD advise if device needs interrogation today, otherwise, will get in am.    NICM (nonischemic cardiomyopathy) - on ACE, BB and oral diuretic at home. Pt had been out of medications prior to admission; weight up slightly, BNP elevated and CHF seen on CXR. Volume overloaded by exam. Continue     History of stroke with residual effects - per IM    Hypertension - SBP has been low/normal since admit    Asthma - per IM    Acute on chronic respiratory failure - has a component of CHF    Systolic CHF, acute on chronic - dry weight is approx 51 kg, currently 53.6, would continue diuresis with Lasix 40 mg IV BID, follow weights, I/O and renal function. Mg has been low in the past, will ck in am. Would try to keep K+ approx 4.0 and Mg >1.8 with hx VT/ICD     Seizures - per IM/Neuro, restart Keppra    Chronic anticoagulation - was on Xarelto PTA, but compliance likely poor. INR is elevated, unclear reason. OK to hold anticoagulation for now, once pt improves and GI eval completed, final decision about long-term anticoagulation can be made.   SignedTheodore Demark, PA-C 03/22/2014 12:18 PM Beeper [phone]  Co-Sign MD Patient seen and discussed with PA Barrett, agree with her documentation above. 58 yo female history of chronic systolic heart failure/NICM LVEF 20%, hx of VT with St Jude device, admitted with AMS. She was found to be volume overloaded, and is currently on IV lasix 40mg  bid. Exacerbating factor likely medication non-compliance. We will follow her response on  current lasix dosing, may need further titration. She is on lopressor at home, in setting of systolic dysfunction will change to coreg 3.125mg  bid. Ok to hold lisinopril and spironolactone given soft blood pressures. Regarding her afib, will continue beta blocker and digoxin. She has an iron deficient anemia as well as abdominal pain, we will hold xarelto until workup complete. Unclear why her INR is elevated given that she is on xarelto, consider repeating and if truly elevated working up for possible liver disease vs vit K deficiency.   Dina Rich MD

## 2014-03-22 NOTE — Progress Notes (Signed)
Son, Elizabeth Love, visited at 16:00. Stated patient now presents at her baseline. States getting patient to take meds is most difficult problem . She refuses her Seraquel at home stating she does not like how it makes her feel.

## 2014-03-22 NOTE — Progress Notes (Signed)
Pt awake, expresses her needs, cooperating with taking pills. RN performed digital lower bowel exploration: no stool present to provide sample for occult lab. Per EMS 7/4;pt had large bowel movement prior to arrival at [facility]. Pt tolerating clears diet. Support/safety  provided during intermittent escalating agitation episodes.

## 2014-03-22 NOTE — Progress Notes (Signed)
Subjective: Patient much improved today.  Based on description of baseline is likely at that point now.    Objective: Current vital signs: BP 108/57  Pulse 99  Temp(Src) 97.4 F (36.3 C) (Oral)  Resp 27  Ht 5\' 2"  (1.575 m)  Wt 53.6 kg (118 lb 2.7 oz)  BMI 21.61 kg/m2  SpO2 98% Vital signs in last 24 hours: Temp:  [96.6 F (35.9 C)-98.2 F (36.8 C)] 97.4 F (36.3 C) (07/05 1116) Pulse Rate:  [89-100] 99 (07/05 1116) Resp:  [24-44] 27 (07/05 1116) BP: (97-128)/(57-77) 108/57 mmHg (07/05 1116) SpO2:  [93 %-100 %] 98 % (07/05 1116) FiO2 (%):  [24 %] 24 % (07/04 1508) Weight:  [53.6 kg (118 lb 2.7 oz)] 53.6 kg (118 lb 2.7 oz) (07/05 0800)  Intake/Output from previous day: 07/04 0701 - 07/05 0700 In: 465.5 [I.V.:145.5; Blood:320] Out: 1200 [Urine:1200] Intake/Output this shift: Total I/O In: 460 [P.O.:400; I.V.:60] Out: 0  Nutritional status: Clear Liquid  Neurologic Exam: Mental Status: Alert and awake.  Points to concerns.  Has some Yes and No answers. Mildly agitated but easily comforted. Cranial Nerves: II: Pupils equal, round, reactive to light and accommodation III,IV, VI: ptosis not present V,VII: Mild facial droop VIII: hearing normal bilaterally IX,X: gag reflex present Motor: Patient with spontaneous movement noted on the left.  No movement noted on the right.  Increased tone on the right  Lab Results: Basic Metabolic Panel:  Recent Labs Lab 03/21/14 0249 03/22/14 0315  NA 138 138  K 4.1 4.8  CL 103 100  CO2 20 15*  GLUCOSE 224* 193*  BUN 19 25*  CREATININE 1.00 1.15*  CALCIUM 9.8 9.9    Liver Function Tests:  Recent Labs Lab 03/21/14 0249  AST 24  ALT 10  ALKPHOS 67  BILITOT 1.5*  PROT 8.2  ALBUMIN 3.1*    Recent Labs Lab 03/21/14 0249  LIPASE 26    Recent Labs Lab 03/21/14 1205  AMMONIA 24    CBC:  Recent Labs Lab 03/21/14 0249 03/21/14 1205 03/21/14 1855 03/22/14 0315 03/22/14 1033  WBC 6.2  --   --  4.6  --    NEUTROABS 4.7  --   --   --   --   HGB 7.8* 8.1* 9.1* 8.6* 8.8*  HCT 27.3* 29.3* 30.7* 29.9* 29.6*  MCV 74.0*  --   --  74.9*  --   PLT 239  --   --  244  --     Cardiac Enzymes:  Recent Labs Lab 03/21/14 0249 03/21/14 1200 03/21/14 1855 03/21/14 2200  TROPONINI <0.30 <0.30 <0.30 <0.30    Lipid Panel: No results found for this basename: CHOL, TRIG, HDL, CHOLHDL, VLDL, LDLCALC,  in the last 168 hours  CBG:  Recent Labs Lab 03/22/14 1237  GLUCAP 176*    Microbiology: Results for orders placed during the hospital encounter of 03/21/14  MRSA PCR SCREENING     Status: None   Collection Time    03/21/14  8:59 AM      Result Value Ref Range Status   MRSA by PCR NEGATIVE  NEGATIVE Final   Comment:            The GeneXpert MRSA Assay (FDA     approved for NASAL specimens     only), is one component of a     comprehensive MRSA colonization     surveillance program. It is not     intended to diagnose MRSA  infection nor to guide or     monitor treatment for     MRSA infections.    Coagulation Studies:  Recent Labs  03/21/14 0633  LABPROT 27.0*  INR 2.50*    Imaging: Ct Head Wo Contrast  03/21/2014   CLINICAL DATA:  Mental status changes  EXAM: CT HEAD WITHOUT CONTRAST  TECHNIQUE: Contiguous axial images were obtained from the base of the skull through the vertex without intravenous contrast.  COMPARISON:  Head CT 01/10/2014  FINDINGS: Encephalomalacia change again appreciated in a left MCA distribution involving the left temporal, frontal and parietal lobes, consistent with chronic infarction. There is left lateral ventricular hydrocephalus ex vacuo. No further evidence of extra-axial fluid collections nor acute hemorrhage. No mass effect. The calvarium is negative. The visualized paranasal sinuses and mastoid air cells are patent.  IMPRESSION: No acute intracranial abnormality.  Stable chronic changes.   Electronically Signed   By: Salome Holmes M.D.   On:  03/21/2014 13:34   Ct Abdomen Pelvis W Contrast  03/21/2014   CLINICAL DATA:  Abdominal pain, too lethargic to tolerate oral contrast  EXAM: CT ABDOMEN AND PELVIS WITH CONTRAST  TECHNIQUE: Multidetector CT imaging of the abdomen and pelvis was performed using the standard protocol following bolus administration of intravenous contrast.  CONTRAST:  100 mL Omnipaque 300  COMPARISON:  None.  FINDINGS: Severe multichamber cardiomegaly. Very small right pleural effusion.  The liver, spleen, adrenals, pancreas, right kidney are unremarkable. Small stable benign-appearing cyst left kidney otherwise unremarkable.  An inferior vena caval filter appreciated below the renal veins. There is no abdominal aortic aneurysm. Atherosclerotic calcifications are appreciated within the aorta and iliac vessels.  No abdominal pelvic masses, or loculated fluid collections. Trace amount of ascites along the inferior border of the liver. Small amount of free fluid within the dependent portion of pelvis.  The bowel and appendix are negative, patient status post cholecystectomy.  No abdominal wall nor inguinal hernia.  No aggressive appearing osseous lesions.  IMPRESSION: Mild area is of ascites along the inferior border of the liver and deep in the portion of the pelvis. These findings are nonspecific. There is otherwise no CT evidence of obstructive or inflammatory abnormalities.  Multichamber cardiomegaly  Small right pleural effusion   Electronically Signed   By: Salome Holmes M.D.   On: 03/21/2014 13:52   Dg Abd Acute W/chest  03/21/2014   CLINICAL DATA:  Abdominal pain for 3 days  EXAM: ACUTE ABDOMEN SERIES (ABDOMEN 2 VIEW & CHEST 1 VIEW)  COMPARISON:  02/11/2014 chest radiograph  FINDINGS: Dual-chamber left approach ICD/ pacer leads have unchanged orientation. There is chronic, marked cardiopericardial enlargement. There is bronchial cuffing, cephalized blood flow, and Kerley lines. The retrocardiac lung is obscured by the heart.   The bowel gas pattern is nonobstructed. Cholecystectomy clips are noted. The liver shadow is prominent, possibly hepatic congestion. No evidence for urolithiasis. No pneumoperitoneum.  IMPRESSION: 1. CHF. 2. Negative abdominal radiographs.   Electronically Signed   By: Tiburcio Pea M.D.   On: 03/21/2014 03:33    Medications:  I have reviewed the patient's current medications. Scheduled: . digoxin  0.125 mg Oral Daily  . escitalopram  20 mg Oral Daily  . furosemide  40 mg Intravenous Q12H  . insulin aspart  0-5 Units Subcutaneous QHS  . insulin aspart  0-9 Units Subcutaneous TID WC  . ipratropium-albuterol  3 mL Nebulization TID  . levETIRAcetam  2,000 mg Oral BID  . levothyroxine  50  mcg Oral QAC breakfast  . metoprolol tartrate  25 mg Oral BID  . pantoprazole (PROTONIX) IV  40 mg Intravenous Q12H  . QUEtiapine Fumarate  150 mg Oral QHS  . sodium chloride  3 mL Intravenous Q12H  . sodium chloride  3 mL Intravenous Q12H    Assessment/Plan: Patient improved. Symptoms likely secondary to medications.  Head CT shows no acute changes.  Echocardiogram shows severe global LV dysfunction.  No current evidence of seizure activity.  Recommendations: 1.  Continue home anticonvulsants. 2.  D/C EEG 3.  Will continue to follow with you   LOS: 1 day   Thana FarrLeslie Roni Friberg, MD [facility] [phone] 03/22/2014  12:47 PM

## 2014-03-23 DIAGNOSIS — F411 Generalized anxiety disorder: Secondary | ICD-10-CM | POA: Diagnosis not present

## 2014-03-23 DIAGNOSIS — I6990 Unspecified sequelae of unspecified cerebrovascular disease: Secondary | ICD-10-CM

## 2014-03-23 DIAGNOSIS — I2510 Atherosclerotic heart disease of native coronary artery without angina pectoris: Secondary | ICD-10-CM | POA: Diagnosis not present

## 2014-03-23 DIAGNOSIS — I10 Essential (primary) hypertension: Secondary | ICD-10-CM | POA: Diagnosis not present

## 2014-03-23 DIAGNOSIS — J9600 Acute respiratory failure, unspecified whether with hypoxia or hypercapnia: Secondary | ICD-10-CM | POA: Diagnosis not present

## 2014-03-23 LAB — BASIC METABOLIC PANEL
Anion gap: 17 — ABNORMAL HIGH (ref 5–15)
BUN: 37 mg/dL — AB (ref 6–23)
CHLORIDE: 101 meq/L (ref 96–112)
CO2: 21 mEq/L (ref 19–32)
Calcium: 9.5 mg/dL (ref 8.4–10.5)
Creatinine, Ser: 1.61 mg/dL — ABNORMAL HIGH (ref 0.50–1.10)
GFR calc Af Amer: 41 mL/min — ABNORMAL LOW (ref >90)
GFR, EST NON AFRICAN AMERICAN: 35 mL/min — AB (ref >90)
GLUCOSE: 144 mg/dL — AB (ref 70–99)
POTASSIUM: 4 meq/L (ref 3.7–5.3)
Sodium: 139 mEq/L (ref 137–147)

## 2014-03-23 LAB — GLUCOSE, CAPILLARY
GLUCOSE-CAPILLARY: 138 mg/dL — AB (ref 70–99)
Glucose-Capillary: 140 mg/dL — ABNORMAL HIGH (ref 70–99)
Glucose-Capillary: 144 mg/dL — ABNORMAL HIGH (ref 70–99)
Glucose-Capillary: 170 mg/dL — ABNORMAL HIGH (ref 70–99)

## 2014-03-23 MED ORDER — FUROSEMIDE 40 MG PO TABS
40.0000 mg | ORAL_TABLET | Freq: Two times a day (BID) | ORAL | Status: DC
  Filled 2014-03-23 (×4): qty 1

## 2014-03-23 MED ORDER — AMIODARONE HCL 200 MG PO TABS
200.0000 mg | ORAL_TABLET | Freq: Every day | ORAL | Status: DC
  Filled 2014-03-23 (×2): qty 1

## 2014-03-23 MED ORDER — OXYCODONE-ACETAMINOPHEN 5-325 MG PO TABS
1.0000 | ORAL_TABLET | Freq: Four times a day (QID) | ORAL | Status: DC | PRN
  Filled 2014-03-23 (×3): qty 1

## 2014-03-23 MED ORDER — PANTOPRAZOLE SODIUM 40 MG PO TBEC
40.0000 mg | DELAYED_RELEASE_TABLET | Freq: Every day | ORAL | Status: DC
  Administered 2014-03-23 – 2014-03-24 (×2): 40 mg via ORAL
  Filled 2014-03-23 (×2): qty 1

## 2014-03-23 MED ORDER — LISINOPRIL 5 MG PO TABS
5.0000 mg | ORAL_TABLET | Freq: Every day | ORAL | Status: DC
  Filled 2014-03-23 (×2): qty 1

## 2014-03-23 MED ORDER — LORAZEPAM 2 MG/ML IJ SOLN
1.0000 mg | Freq: Once | INTRAMUSCULAR | Status: AC
  Administered 2014-03-23: 1 mg via INTRAVENOUS
  Filled 2014-03-23: qty 1

## 2014-03-23 MED ORDER — POLYSACCHARIDE IRON COMPLEX 150 MG PO CAPS
150.0000 mg | ORAL_CAPSULE | Freq: Every day | ORAL | Status: DC
  Filled 2014-03-23 (×2): qty 1

## 2014-03-23 NOTE — Progress Notes (Signed)
RT was giving breathing tx, pt stated she was in pain. Call light hit and RT made front desk aware of pt pain.

## 2014-03-23 NOTE — Progress Notes (Addendum)
Advanced Heart Failure Rounding Note   Subjective:    Denies dyspnea or orthopnea. Crying about ICD being at [facility] (dead battery)    Objective:   Weight Range:  Vital Signs:   Temp:  [97.4 F (36.3 C)-98.4 F (36.9 C)] 97.8 F (36.6 C) (07/06 0759) Pulse Rate:  [77-99] 83 (07/06 1012) Resp:  [18-32] 21 (07/06 0759) BP: (95-111)/(44-78) 95/44 mmHg (07/06 0837) SpO2:  [92 %-100 %] 92 % (07/06 0812) Last BM Date: 03/22/14  Weight change: Filed Weights   03/21/14 0900 03/22/14 0800  Weight: 115 lb (52.164 kg) 118 lb 2.7 oz (53.6 kg)    Intake/Output:   Intake/Output Summary (Last 24 hours) at 03/23/14 1100 Last data filed at 03/23/14 1014  Gross per 24 hour  Intake    766 ml  Output   1025 ml  Net   -259 ml     Physical Exam: General: Lying in bed Lethargic but Interactive. Some expressive aphasia. No resp difficulty HEENT: normal  Neck: supple. JVP flat  Carotids 2+ bilaterally; no bruits. No lymphadenopathy or thryomegaly appreciated.  Cor: PMI normal. Regular rate & rhythm. No rubs, gallops 2/6 MR Lungs: clear  Abdomen: soft, nontender, nondistended. No hepatosplenomegaly. No bruits or masses. Good bowel sounds.  Extremities: no cyanosis, clubbing, rash, edema  Neuro: interactive. R sided weakness. + expressive aphasia    Labs: Basic Metabolic Panel:  Recent Labs Lab 03/21/14 0249 03/22/14 0315 03/23/14 0335  NA 138 138 139  K 4.1 4.8 4.0  CL 103 100 101  CO2 20 15* 21  GLUCOSE 224* 193* 144*  BUN 19 25* 37*  CREATININE 1.00 1.15* 1.61*  CALCIUM 9.8 9.9 9.5    Liver Function Tests:  Recent Labs Lab 03/21/14 0249  AST 24  ALT 10  ALKPHOS 67  BILITOT 1.5*  PROT 8.2  ALBUMIN 3.1*    Recent Labs Lab 03/21/14 0249  LIPASE 26    Recent Labs Lab 03/21/14 1205  AMMONIA 24    CBC:  Recent Labs Lab 03/21/14 0249 03/21/14 1205 03/21/14 1855 03/22/14 0315 03/22/14 1033 03/22/14 1945  WBC 6.2  --   --  4.6  --   --   NEUTROABS  4.7  --   --   --   --   --   HGB 7.8* 8.1* 9.1* 8.6* 8.8* 9.1*  HCT 27.3* 29.3* 30.7* 29.9* 29.6* 30.4*  MCV 74.0*  --   --  74.9*  --   --   PLT 239  --   --  244  --   --     Cardiac Enzymes:  Recent Labs Lab 03/21/14 0249 03/21/14 1200 03/21/14 1855 03/21/14 2200  TROPONINI <0.30 <0.30 <0.30 <0.30    BNP: BNP (last 3 results)  Recent Labs  11/12/13 0855 01/10/14 0912 03/21/14 0633  PROBNP 5947.0* 7500.0* 11633.0*     Other results:  EKG:   Imaging: Ct Head Wo Contrast  03/21/2014   CLINICAL DATA:  Mental status changes  EXAM: CT HEAD WITHOUT CONTRAST  TECHNIQUE: Contiguous axial images were obtained from the base of the skull through the vertex without intravenous contrast.  COMPARISON:  Head CT 01/10/2014  FINDINGS: Encephalomalacia change again appreciated in a left MCA distribution involving the left temporal, frontal and parietal lobes, consistent with chronic infarction. There is left lateral ventricular hydrocephalus ex vacuo. No further evidence of extra-axial fluid collections nor acute hemorrhage. No mass effect. The calvarium is negative. The visualized paranasal sinuses and mastoid  air cells are patent.  IMPRESSION: No acute intracranial abnormality.  Stable chronic changes.   Electronically Signed   By: Salome HolmesHector  Cooper M.D.   On: 03/21/2014 13:34   Ct Abdomen Pelvis W Contrast  03/21/2014   CLINICAL DATA:  Abdominal pain, too lethargic to tolerate oral contrast  EXAM: CT ABDOMEN AND PELVIS WITH CONTRAST  TECHNIQUE: Multidetector CT imaging of the abdomen and pelvis was performed using the standard protocol following bolus administration of intravenous contrast.  CONTRAST:  100 mL Omnipaque 300  COMPARISON:  None.  FINDINGS: Severe multichamber cardiomegaly. Very small right pleural effusion.  The liver, spleen, adrenals, pancreas, right kidney are unremarkable. Small stable benign-appearing cyst left kidney otherwise unremarkable.  An inferior vena caval filter  appreciated below the renal veins. There is no abdominal aortic aneurysm. Atherosclerotic calcifications are appreciated within the aorta and iliac vessels.  No abdominal pelvic masses, or loculated fluid collections. Trace amount of ascites along the inferior border of the liver. Small amount of free fluid within the dependent portion of pelvis.  The bowel and appendix are negative, patient status post cholecystectomy.  No abdominal wall nor inguinal hernia.  No aggressive appearing osseous lesions.  IMPRESSION: Mild area is of ascites along the inferior border of the liver and deep in the portion of the pelvis. These findings are nonspecific. There is otherwise no CT evidence of obstructive or inflammatory abnormalities.  Multichamber cardiomegaly  Small right pleural effusion   Electronically Signed   By: Salome HolmesHector  Cooper M.D.   On: 03/21/2014 13:52      Medications:     Scheduled Medications: . carvedilol  3.125 mg Oral BID WC  . digoxin  0.125 mg Oral Daily  . escitalopram  20 mg Oral Daily  . furosemide  40 mg Oral BID  . insulin aspart  0-5 Units Subcutaneous QHS  . insulin aspart  0-9 Units Subcutaneous TID WC  . ipratropium-albuterol  3 mL Nebulization TID  . iron polysaccharides  150 mg Oral Daily  . levETIRAcetam  2,000 mg Oral BID  . levothyroxine  50 mcg Oral QAC breakfast  . pantoprazole  40 mg Oral Q0600  . QUEtiapine Fumarate  150 mg Oral QHS  . sodium chloride  3 mL Intravenous Q12H  . sodium chloride  3 mL Intravenous Q12H     Infusions:     PRN Medications:  sodium chloride, sodium chloride, acetaminophen, acetaminophen, albuterol, LORazepam, ondansetron (ZOFRAN) IV, ondansetron, oxyCODONE-acetaminophen, polyethylene glycol, sodium chloride, sodium chloride   Assessment:   1. Chronic systolic HF due to NICM. EF 15-20%  2. Acute delirium 3. H/o VT s/p ICD 4. H/o DVT 5. Noncompliance 6. H/o PAF previously on amio/xarelto 7. CVA with R-sided weakness.  8. QT  prolongation   Plan/Discussion:    Volume status looks good. Weight at baseline. Agree with switch to po diuretics. [facility] Will have EP look at device.   Continue current regimen. Will add back lisinopril as BP tolerates.   Now back in SR Amio on hold due to QT prolongation. Will restart. I favor restarting anti-coagulation if she will comply.  Will repeat ECG to assess QT prolongation. If worsening may need to stop seroquel and celexa.   Length of Stay: 2 Arvilla Meresaniel [name] MD 03/23/2014, 11:00 AM  Advanced Heart Failure Team Pager [phone] (M-F; 7a - 4p)  Please contact [facility] for night-coverage after hours (4p -7a ) and weekends on [email or web address]

## 2014-03-23 NOTE — Progress Notes (Signed)
Pt yelling & screaming to the top of her lungs for hours. Pt c/o of a h/a, abdl pain & arm pain but when offered pain meds would not take. Pt requesting her sleeping pill. When offered her sleeping pill pt would not take it. Pt's foley was d/c'd & new orders for IV ativan was given. After the pt received her dose of ativan pt stopped yelling rolled over & told the RN good night. Sanda Linger

## 2014-03-23 NOTE — Progress Notes (Signed)
Received order however pt in [name] for ambulation in hall and education. Will not follow. Ethelda Chick CES, ACSM 10:38 AM 03/23/2014

## 2014-03-23 NOTE — Progress Notes (Signed)
Patient ID: HIMA LINNEY  female  BWL:[id number]    DOB: 1955-11-20    DOA: 03/21/2014  PCP: Estill Cotta, MD  Assessment/Plan:  Principal Problem:  Acute encephalopathy: Resolved, appears to be at her baseline resolved, has dysarthria due to prior stroke  - CT head negative for any acute stroke, cannot have MRI due to ICD - Neurology following, EEG discontinued, continue Keppra, no seizures during hospitalization - Ammonia level normal, urine drug screen negative, TSH 1.8  - No haldol due to prolonged QTC   Active Problems:  Chronic pain  - Asking for pain medications, placed on home dose of Percocet, no escalation of pain medications  Anemia with Abdominal pain: Unsure of chronicity of the pain or if any GI bleed, no vomiting or hematemesis per brother, still possibility of GI bleed as patient is on anticoagulation, no active bleeding -CT abdomen pelvis negative for any acute abdominal pathology or retroperitoneal bleed - Status post one unit packed RBC transfusion 7/4 - anemia panel consistent with iron deficiency, ferritin 26, iron level 16, Saturation ratio 3 -  GI was consulted (Dr Marina Goodell), do not feel she is an appropriate candidate for any endoscopic evaluations, recommended lifelong PPI, iron replacement and prn transfusion  - Hemoglobin stable  Acute on chronic combined systolic and diastolic CHF: Medically noncompliant, history of ICD, NICM  - Negative 536cc on I.'s and O.'s, on IV diuresis - 2-D echo showed EF of 15% with diffuse hypokinesis, prior echo in 07/2013 head showed EF of 20% with diffuse hypokinesis  - Cardiology  following, change to Coreg, continue diuresis, ? Change to oral Lasix   History of atrial fibrillation - Currently rate controlled, no GI workup, restart xarelto? - Patient is on amiodarone on weekdays (?), per PTA meds, does not take on the weekend, will defer to cardiology for further recommendations.    DM type 2 (diabetes mellitus,  type 2)  - Continue sliding scale insulin  History of stroke with residual effects: With right-sided hemiparesis per patient's brother, no dysphagia  - Tolerating diet   Hypertension  - Currently stable   Asthma with Acute on chronic respiratory failure  -Currently stable continue duonebs  History of Seizures  - Family had not noticed any seizure activity, continue Keppra   Hypothyroidism  - Continue Synthroid, TSH 1.8  DVT Prophylaxis: SCDs  Code Status: Full code  Family Communication:  Disposition: transfer to telemetry floor, hopefully DC home tomorrow   Consultants:  Cardiology  Neurology  GI  Procedures:  None  Antibiotics:  None    Subjective: Patient seen and examined, no acute issues overnight   Objective: Weight change: 1.436 kg (3 lb 2.7 oz)  Intake/Output Summary (Last 24 hours) at 03/23/14 0951 Last data filed at 03/23/14 0800  Gross per 24 hour  Intake    763 ml  Output   1025 ml  Net   -262 ml   Blood pressure 95/44, pulse 80, temperature 97.8 F (36.6 C), temperature source Axillary, resp. rate 21, height 5\' 2"  (1.575 m), weight 53.6 kg (118 lb 2.7 oz), SpO2 92.00%.  Physical Exam: General: Alert and awake, oriented to self CVS: S1-S2 clear, no murmur rubs or gallops Chest: clear to auscultation bilaterally Abdomen: soft nontender, nondistended, normal bowel sounds  Extremities: no cyanosis, clubbing or edema noted bilaterally Neuro: Right-sided hemiparesis, dysarthria from previous stroke  Lab Results: Basic Metabolic Panel:  Recent Labs Lab 03/22/14 0315 03/23/14 0335  NA 138 139  K 4.8 4.0  CL 100 101  CO2 15* 21  GLUCOSE 193* 144*  BUN 25* 37*  CREATININE 1.15* 1.61*  CALCIUM 9.9 9.5   Liver Function Tests:  Recent Labs Lab 03/21/14 0249  AST 24  ALT 10  ALKPHOS 67  BILITOT 1.5*  PROT 8.2  ALBUMIN 3.1*    Recent Labs Lab 03/21/14 0249  LIPASE 26    Recent Labs Lab 03/21/14 1205  AMMONIA 24    CBC:  Recent Labs Lab 03/21/14 0249  03/22/14 0315 03/22/14 1033 03/22/14 1945  WBC 6.2  --  4.6  --   --   NEUTROABS 4.7  --   --   --   --   HGB 7.8*  < > 8.6* 8.8* 9.1*  HCT 27.3*  < > 29.9* 29.6* 30.4*  MCV 74.0*  --  74.9*  --   --   PLT 239  --  244  --   --   < > = values in this interval not displayed. Cardiac Enzymes:  Recent Labs Lab 03/21/14 1200 03/21/14 1855 03/21/14 2200  TROPONINI <0.30 <0.30 <0.30   BNP: No components found with this basename: POCBNP,  CBG:  Recent Labs Lab 03/22/14 1237 03/22/14 1550 03/22/14 2134 03/23/14 0807  GLUCAP 176* 167* 120* 138*     Micro Results: Recent Results (from the past 240 hour(s))  MRSA PCR SCREENING     Status: None   Collection Time    03/21/14  8:59 AM      Result Value Ref Range Status   MRSA by PCR NEGATIVE  NEGATIVE Final   Comment:            The GeneXpert MRSA Assay (FDA     approved for NASAL specimens     only), is one component of a     comprehensive MRSA colonization     surveillance program. It is not     intended to diagnose MRSA     infection nor to guide or     monitor treatment for     MRSA infections.  URINE CULTURE     Status: None   Collection Time    03/21/14 11:49 AM      Result Value Ref Range Status   Specimen Description URINE, CLEAN CATCH   Final   Special Requests NONE   Final   Culture  Setup Time     Final   Value: 03/21/2014 21:35     Performed at [facility] Count     Final   Value: NO GROWTH     Performed at [facility]   Culture     Final   Value: NO GROWTH     Performed at [facility]   Report Status 03/22/2014 FINAL   Final    Studies/Results: Ct Head Wo Contrast  03/21/2014   CLINICAL DATA:  Mental status changes  EXAM: CT HEAD WITHOUT CONTRAST  TECHNIQUE: Contiguous axial images were obtained from the base of the skull through the vertex without intravenous contrast.  COMPARISON:  Head CT 01/10/2014  FINDINGS:  Encephalomalacia change again appreciated in a left MCA distribution involving the left temporal, frontal and parietal lobes, consistent with chronic infarction. There is left lateral ventricular hydrocephalus ex vacuo. No further evidence of extra-axial fluid collections nor acute hemorrhage. No mass effect. The calvarium is negative. The visualized paranasal sinuses and mastoid air cells are patent.  IMPRESSION: No acute intracranial abnormality.  Stable chronic changes.   Electronically Signed   By: Salome HolmesHector  Cooper M.D.   On: 03/21/2014 13:34   Ct Abdomen Pelvis W Contrast  03/21/2014   CLINICAL DATA:  Abdominal pain, too lethargic to tolerate oral contrast  EXAM: CT ABDOMEN AND PELVIS WITH CONTRAST  TECHNIQUE: Multidetector CT imaging of the abdomen and pelvis was performed using the standard protocol following bolus administration of intravenous contrast.  CONTRAST:  100 mL Omnipaque 300  COMPARISON:  None.  FINDINGS: Severe multichamber cardiomegaly. Very small right pleural effusion.  The liver, spleen, adrenals, pancreas, right kidney are unremarkable. Small stable benign-appearing cyst left kidney otherwise unremarkable.  An inferior vena caval filter appreciated below the renal veins. There is no abdominal aortic aneurysm. Atherosclerotic calcifications are appreciated within the aorta and iliac vessels.  No abdominal pelvic masses, or loculated fluid collections. Trace amount of ascites along the inferior border of the liver. Small amount of free fluid within the dependent portion of pelvis.  The bowel and appendix are negative, patient status post cholecystectomy.  No abdominal wall nor inguinal hernia.  No aggressive appearing osseous lesions.  IMPRESSION: Mild area is of ascites along the inferior border of the liver and deep in the portion of the pelvis. These findings are nonspecific. There is otherwise no CT evidence of obstructive or inflammatory abnormalities.  Multichamber cardiomegaly  Small  right pleural effusion   Electronically Signed   By: Salome HolmesHector  Cooper M.D.   On: 03/21/2014 13:52   Dg Abd Acute W/chest  03/21/2014   CLINICAL DATA:  Abdominal pain for 3 days  EXAM: ACUTE ABDOMEN SERIES (ABDOMEN 2 VIEW & CHEST 1 VIEW)  COMPARISON:  02/11/2014 chest radiograph  FINDINGS: Dual-chamber left approach ICD/ pacer leads have unchanged orientation. There is chronic, marked cardiopericardial enlargement. There is bronchial cuffing, cephalized blood flow, and Kerley lines. The retrocardiac lung is obscured by the heart.  The bowel gas pattern is nonobstructed. Cholecystectomy clips are noted. The liver shadow is prominent, possibly hepatic congestion. No evidence for urolithiasis. No pneumoperitoneum.  IMPRESSION: 1. CHF. 2. Negative abdominal radiographs.   Electronically Signed   By: Tiburcio PeaJonathan  Watts M.D.   On: 03/21/2014 03:33    Medications: Scheduled Meds: . carvedilol  3.125 mg Oral BID WC  . digoxin  0.125 mg Oral Daily  . escitalopram  20 mg Oral Daily  . furosemide  40 mg Intravenous Q12H  . insulin aspart  0-5 Units Subcutaneous QHS  . insulin aspart  0-9 Units Subcutaneous TID WC  . ipratropium-albuterol  3 mL Nebulization TID  . levETIRAcetam  2,000 mg Oral BID  . levothyroxine  50 mcg Oral QAC breakfast  . pantoprazole (PROTONIX) IV  40 mg Intravenous Q12H  . QUEtiapine Fumarate  150 mg Oral QHS  . sodium chloride  3 mL Intravenous Q12H  . sodium chloride  3 mL Intravenous Q12H      LOS: 2 days   Armentha Branagan M.D. [facility] 03/23/2014, 9:51 AM Pager: [phone]  If 7PM-7AM, please contact night-coverage [email or web address] Password [email or web address]  **Disclaimer: This note was dictated with voice recognition software. Similar sounding words can inadvertently be transcribed and this note may contain transcription errors which may not have been corrected upon publication of note.**

## 2014-03-23 NOTE — Evaluation (Signed)
Physical Therapy Evaluation Patient Details Name: KESTREL BEEVER MRN: [id number] DOB: January 11, 1956 Today's Date: 03/23/2014   History of Present Illness  Pt admit with abdominal pain.  Acute encephalopathy and CHF.    Clinical Impression  Pt admitted with above. Pt currently with functional limitations due to the deficits listed below (see PT Problem List). Pt needs 24 hour assist at home.   Pt will benefit from skilled PT to increase their independence and safety with mobility to allow discharge to the venue listed below.     Follow Up Recommendations Home health PT;Supervision/Assistance - 24 hour    Equipment Recommendations   (TBA)    Recommendations for Other Services       Precautions / Restrictions Precautions Precautions: Fall Restrictions Weight Bearing Restrictions: No      Mobility  Bed Mobility Overal bed mobility: Needs Assistance;+2 for physical assistance Bed Mobility: Rolling;Supine to Sit Rolling: Independent   Supine to sit: Mod assist     General bed mobility comments: Attempted OOB however when PT tried to pull covers down, pt reached and pulled them back up.  Encouraged OOB and pt continued to resist OOB.  Pt was agreeing she would like to get up but would not allow covers to be pulled off.  After several attempts to initiate mobility, decided to let pt stay in bed as pt resisting more and more.  Nursing in room and aware.   Transfers                    Ambulation/Gait                Stairs            Wheelchair Mobility    Modified Rankin (Stroke Patients Only)       Balance                                             Pertinent Vitals/Pain 80 bpm, 95% O2, 19, 95/44.  No pain reported.    Home Living Family/patient expects to be discharged to:: Private residence Living Arrangements: Children;Other relatives Available Help at Discharge: Family;Available 24 hours/day Type of Home: House Home Access:  Level entry     Home Layout: Two level;Bed/bath upstairs Home Equipment: Bedside commode;Cane - quad;Wheelchair - manual;Shower seat Additional Comments: pt is a poor historian     Prior Function Level of Independence: Needs assistance   Gait / Transfers Assistance Needed: pt reports she stays in her wheelchair most of the time  ADL's / Homemaking Assistance Needed: pt reports her son dresses her  Comments: Pt lives with brother and son per nurse     Hand Dominance   Dominant Hand: Left    Extremity/Trunk Assessment   Upper Extremity Assessment: Defer to OT evaluation           Lower Extremity Assessment: Generalized weakness         Communication   Communication: Expressive difficulties;Receptive difficulties  Cognition Arousal/Alertness: Lethargic;Suspect due to medications Behavior During Therapy: Agitated (Became agitated when PT initiated mobility.) Overall Cognitive Status: History of cognitive impairments - at baseline                      General Comments General comments (skin integrity, edema, etc.): Moved left LE but would not exercise either extremity to command.  Exercises General Exercises - Upper Extremity Shoulder Flexion: AROM;5 reps;Supine;Left Elbow Flexion: AROM;Left;5 reps;Supine      Assessment/Plan    PT Assessment Patient needs continued PT services  PT Diagnosis Generalized weakness   PT Problem List Decreased activity tolerance;Decreased balance;Decreased mobility;Decreased knowledge of use of DME;Decreased safety awareness;Decreased knowledge of precautions;Decreased cognition  PT Treatment Interventions DME instruction;Gait training;Functional mobility training;Therapeutic activities;Therapeutic exercise;Balance training;Patient/family education   PT Goals (Current goals can be found in the Care Plan section) Acute Rehab PT Goals Patient Stated Goal: unable to state PT Goal Formulation: With patient Time For Goal  Achievement: 03/30/14 Potential to Achieve Goals: Good    Frequency Min 3X/week   Barriers to discharge        Co-evaluation               End of Session   Activity Tolerance: Patient limited by fatigue;Treatment limited secondary to agitation Patient left: in bed;with call bell/phone within reach;with nursing/sitter in room;with bed alarm set Nurse Communication: Mobility status         Time: [id number] PT Time Calculation (min): 9 min   Charges:   PT Evaluation $Initial PT Evaluation Tier I: 1 Procedure     PT G Codes:          INGOLD,Ardyth Kelso 03/23/2014, 9:41 AM Audree Camelawn Ingold,PT Acute Rehabilitation [phone] (pager)

## 2014-03-24 DIAGNOSIS — I69920 Aphasia following unspecified cerebrovascular disease: Secondary | ICD-10-CM

## 2014-03-24 DIAGNOSIS — F411 Generalized anxiety disorder: Secondary | ICD-10-CM

## 2014-03-24 DIAGNOSIS — J9620 Acute and chronic respiratory failure, unspecified whether with hypoxia or hypercapnia: Secondary | ICD-10-CM

## 2014-03-24 DIAGNOSIS — I4891 Unspecified atrial fibrillation: Secondary | ICD-10-CM

## 2014-03-24 DIAGNOSIS — R0902 Hypoxemia: Secondary | ICD-10-CM

## 2014-03-24 DIAGNOSIS — G9340 Encephalopathy, unspecified: Secondary | ICD-10-CM

## 2014-03-24 DIAGNOSIS — I5041 Acute combined systolic (congestive) and diastolic (congestive) heart failure: Secondary | ICD-10-CM

## 2014-03-24 DIAGNOSIS — J9600 Acute respiratory failure, unspecified whether with hypoxia or hypercapnia: Secondary | ICD-10-CM | POA: Diagnosis not present

## 2014-03-24 LAB — BASIC METABOLIC PANEL
Anion gap: 18 — ABNORMAL HIGH (ref 5–15)
BUN: 33 mg/dL — ABNORMAL HIGH (ref 6–23)
CO2: 19 meq/L (ref 19–32)
CREATININE: 1.16 mg/dL — AB (ref 0.50–1.10)
Calcium: 9.1 mg/dL (ref 8.4–10.5)
Chloride: 100 mEq/L (ref 96–112)
GFR calc Af Amer: 60 mL/min — ABNORMAL LOW (ref >90)
GFR, EST NON AFRICAN AMERICAN: 52 mL/min — AB (ref >90)
Glucose, Bld: 217 mg/dL — ABNORMAL HIGH (ref 70–99)
Potassium: 3.7 mEq/L (ref 3.7–5.3)
Sodium: 137 mEq/L (ref 137–147)

## 2014-03-24 LAB — MAGNESIUM: MAGNESIUM: 1.9 mg/dL (ref 1.5–2.5)

## 2014-03-24 LAB — CBC
HEMATOCRIT: 31.1 % — AB (ref 36.0–46.0)
Hemoglobin: 9.2 g/dL — ABNORMAL LOW (ref 12.0–15.0)
MCH: 22.2 pg — AB (ref 26.0–34.0)
MCHC: 29.6 g/dL — AB (ref 30.0–36.0)
MCV: 75.1 fL — ABNORMAL LOW (ref 78.0–100.0)
Platelets: 244 10*3/uL (ref 150–400)
RBC: 4.14 MIL/uL (ref 3.87–5.11)
RDW: 20.5 % — ABNORMAL HIGH (ref 11.5–15.5)
WBC: 7.8 10*3/uL (ref 4.0–10.5)

## 2014-03-24 LAB — GLUCOSE, CAPILLARY: GLUCOSE-CAPILLARY: 270 mg/dL — AB (ref 70–99)

## 2014-03-24 MED ORDER — LISINOPRIL 2.5 MG PO TABS: 2.5000 mg | ORAL_TABLET | Freq: Every day | ORAL | Status: DC

## 2014-03-24 MED ORDER — CARVEDILOL 3.125 MG PO TABS: 3.1250 mg | ORAL_TABLET | Freq: Two times a day (BID) | ORAL | Status: DC

## 2014-03-24 MED ORDER — POLYSACCHARIDE IRON COMPLEX 150 MG PO CAPS: 150.0000 mg | ORAL_CAPSULE | Freq: Every day | ORAL | Status: DC

## 2014-03-24 MED ORDER — OXYCODONE-ACETAMINOPHEN 10-325 MG PO TABS: 1.0000 | ORAL_TABLET | Freq: Three times a day (TID) | ORAL | Status: DC | PRN

## 2014-03-24 MED ORDER — AMIODARONE HCL 200 MG PO TABS: 200.0000 mg | ORAL_TABLET | Freq: Every day | ORAL | Status: DC

## 2014-03-24 MED ORDER — MORPHINE SULFATE 2 MG/ML IJ SOLN
1.0000 mg | Freq: Once | INTRAMUSCULAR | Status: AC
  Administered 2014-03-24: 1 mg via INTRAVENOUS
  Filled 2014-03-24: qty 1

## 2014-03-24 MED ORDER — FUROSEMIDE 40 MG PO TABS: 40.0000 mg | ORAL_TABLET | Freq: Two times a day (BID) | ORAL | Status: DC

## 2014-03-24 MED ORDER — LORAZEPAM 2 MG/ML IJ SOLN
0.5000 mg | Freq: Once | INTRAMUSCULAR | Status: AC
  Administered 2014-03-24: 0.5 mg via INTRAVENOUS
  Filled 2014-03-24: qty 1

## 2014-03-24 MED ORDER — PANTOPRAZOLE SODIUM 40 MG PO TBEC: 40.0000 mg | DELAYED_RELEASE_TABLET | Freq: Every day | ORAL | Status: DC

## 2014-03-24 MED ORDER — ALPRAZOLAM 1 MG PO TABS: 1.0000 mg | ORAL_TABLET | Freq: Every evening | ORAL | Status: DC | PRN

## 2014-03-24 NOTE — Progress Notes (Signed)
PT Cancellation Note  Patient Details Name: Elizabeth Love MRN: [id number] DOB: [birth year]   Cancelled Treatment:    Reason Eval/Treat Not Completed: Other (comment) (Pt to D/C today with ambulance transport already called and Rn stated defer at present time)   Delorse Lek 03/24/2014, 9:51 AM Delaney Meigs, PT [phone]

## 2014-03-24 NOTE — Care Management Note (Signed)
    Page 1 of 1   03/24/2014     11:25:18 AM CARE MANAGEMENT NOTE 03/24/2014  Patient:  Elizabeth Love, Elizabeth Love   Account Number:  [id number]  Date Initiated:  03/24/2014  Documentation initiated by:  GRAVES-BIGELOW,Percival Glasheen  Subjective/Objective Assessment:   Pt admitted for abdominal pain. Per pt she is active with [facility].     Action/Plan:   CM did call [facility] with resumption orders for services. No further needs from CM at this time.   Anticipated DC Date:  03/24/2014   Anticipated DC Plan:  HOME W HOME HEALTH SERVICES      DC Planning Services  CM consult      [facility] Choice  HOME HEALTH   Choice offered to / List presented to:  C-1 Patient        HH arranged  HH-1 RN  HH-10 DISEASE MANAGEMENT  HH-2 PT  HH-3 OT  HH-4 NURSE'S AIDE      HH agency  [facility]   Status of service:  Completed, signed off Medicare Important Message given?  NO (If response is "NO", the following Medicare IM given date fields will be blank) Date Medicare IM given:   Medicare IM given by:   Date Additional Medicare IM given:   Additional Medicare IM given by:    Discharge Disposition:  HOME W HOME HEALTH SERVICES  Per UR Regulation:  Reviewed for med. necessity/level of care/duration of stay  If discussed at Long Length of Stay Meetings, dates discussed:    Comments:

## 2014-03-24 NOTE — Progress Notes (Signed)
Np on call notified of pt c/o of abdl pain. abd assessed & noted to have active bowel sounds. Pt jumping out of the bed crying & moaning. New orders for IV ativan as well as IV morphine. Will continue to monitor the pt. Sanda Linger, RN

## 2014-03-24 NOTE — Discharge Summary (Signed)
Physician Discharge Summary  Patient ID: SERITA DEGROOTE MRN: [id number] DOB/AGE: 1956-07-02 58 y.o.  Admit date: 03/21/2014 Discharge date: 03/24/2014  Primary Care Physician:  [name], [name], MD  Discharge Diagnoses:    . Acute on chronic respiratory failure resolved . Systolic CHF, acute on chronic . Acute encephalopathy . anemia  . Hypertension . NICM (nonischemic cardiomyopathy) . Automatic implantable cardiac defibrillator [employer] . Chronic pain . DM type 2 (diabetes mellitus, type 2) . Seizures  Consults: Gastroenterology, Dr. Marina Goodell                   Cardiology, Dr Wyline Mood                    Neurology, Dr. Thad Ranger        Allergies:   Allergies  Allergen Reactions  . Penicillins Rash  . Sulfa Antibiotics Rash     Discharge Medications:   Medication List    STOP taking these medications       metoprolol tartrate 25 MG tablet  Commonly known as:  LOPRESSOR     omeprazole 20 MG capsule  Commonly known as:  PRILOSEC     spironolactone 25 MG tablet  Commonly known as:  ALDACTONE      TAKE these medications       acetaminophen 325 MG tablet  Commonly known as:  TYLENOL  Take 650 mg by mouth every 6 (six) hours as needed for pain.     albuterol (2.5 MG/3ML) 0.083% nebulizer solution  Commonly known as:  PROVENTIL  Take 2.5 mg by nebulization every 6 (six) hours as needed. For cough or wheeze     ALPRAZolam 1 MG tablet  Commonly known as:  XANAX  Take 1 tablet (1 mg total) by mouth at bedtime as needed for anxiety.     amiodarone 200 MG tablet  Commonly known as:  PACERONE  Take 1 tablet (200 mg total) by mouth daily.     carvedilol 3.125 MG tablet  Commonly known as:  COREG  Take 1 tablet (3.125 mg total) by mouth 2 (two) times daily with a meal.     digoxin 0.125 MG tablet  Commonly known as:  LANOXIN  Take 1 tablet (0.125 mg total) by mouth daily.     escitalopram 20 MG tablet  Commonly known as:  LEXAPRO  Take 1 tablet (20 mg  total) by mouth daily.     furosemide 40 MG tablet  Commonly known as:  LASIX  Take 1 tablet (40 mg total) by mouth 2 (two) times daily.     insulin lispro 100 UNIT/ML injection  Commonly known as:  HUMALOG  Inject into the skin 3 (three) times daily before meals. On sliding scale     iron polysaccharides 150 MG capsule  Commonly known as:  NIFEREX  Take 1 capsule (150 mg total) by mouth daily.     levETIRAcetam 1000 MG tablet  Commonly known as:  KEPPRA  Take 2,000 mg by mouth 2 (two) times daily.     levothyroxine 50 MCG tablet  Commonly known as:  SYNTHROID, LEVOTHROID  Take 50 mcg by mouth daily before breakfast.     lisinopril 2.5 MG tablet  Commonly known as:  PRINIVIL,ZESTRIL  Take 1 tablet (2.5 mg total) by mouth daily.     metolazone 2.5 MG tablet  Commonly known as:  ZAROXOLYN  TAKE 1 TABLET BY MOUTH AS NEEDED. FOR FLUID RETENTION  oxyCODONE-acetaminophen 10-325 MG per tablet  Commonly known as:  PERCOCET  Take 1 tablet by mouth every 8 (eight) hours as needed for pain.     pantoprazole 40 MG tablet  Commonly known as:  PROTONIX  Take 1 tablet (40 mg total) by mouth daily.     polyethylene glycol packet  Commonly known as:  MIRALAX / GLYCOLAX  Take 17 g by mouth daily as needed. For constipation     potassium chloride 20 MEQ/15ML (10%) solution  Take 15 mLs (20 mEq total) by mouth daily.     QUEtiapine Fumarate 150 MG 24 hr tablet  Commonly known as:  SEROQUEL XR  Take 1 tablet (150 mg total) by mouth at bedtime.     Vitamin D (Ergocalciferol) 50000 UNITS Caps capsule  Commonly known as:  DRISDOL  Take 50,000 Units by mouth every 30 (thirty) days.     XARELTO 20 MG Tabs tablet  Generic drug:  rivaroxaban  Take 20 mg by mouth daily.         Brief H and P: For complete details please refer to admission H and P, but in brief Patient is a 58 year old female with history of prior CVA in 2007, with residual right-sided hemiparesis and expressive  aphasia, chronic systolic CHF, atrial fibrillation on anticoagulation, ICD, asthma, diabetes was brought to med center [facility] ER for acute agitation, abdominal pain. Patient was unable to provide any history herself, lethargic due to multiple Ativan and Geodon she received at the ER prior to transferring to [facility].  History was based on on my conversation with patient's brother, Mr. Naoma Boxell on the phone [phone]), who reported that at baseline patient only says yes and no, is alert but not conversant due to her stroke. He reported that she was at her baseline a day prior to admission, around midnight woke up screaming (and per the brother she does that usually due to her anxiety attacks) and was agitated. Patient had run out of her Xanax and had not been taking her medications including the Lasix. She has been having abdominal pain for last 3 days, headache which is chronic and she takes Percocet for that.  Per her brother, she usually walks a little bit with a walker. She has significant behavioral problems including agitation after the stroke.  At the time of my encounter, patient was lethargic and unable to provide any history although she pointed out to her abdomen for pain. Patient was wheezing, and was on BiPAP in med center [facility] ED, currently off.  Patient has a history of seizures, but her brother reported that he did not notice any seizures last night. He also reported that patient lives with her family, including him.   Hospital Course:   Acute encephalopathy: Resolved, appears to be at her baseline resolved, confirmed with her brother, she has dysarthria and childlike behavior (crying, agitation) due to prior stroke. According to her brother, she had run out of Xanax at home and was noncompliant with her medications. CT head was negative for any acute stroke, cannot have MRI due to ICD. Neurology was consulted, patient was seen by Dr. Thad Ranger. She was placed on Keppra at  her home dose. The next morning patient was alert and awake and EEG was discontinued, she had no seizures during hospitalization.   Ammonia level normal, urine drug screen negative, TSH 1.8  Repeat EKGs showed improvement in QTC. Celexa and Seroquel will need to be discontinued if her QTC starts  prolonging in future.   Chronic pain : Patient repeatedly asked for pain medications, which is her typical behavior at home per her brother. Patient was placed back on her home dose of Percocet.  Anemia with Abdominal pain: Unsure of chronicity of the pain or if any GI bleed, no vomiting or hematemesis per brother, still slight possibility of GI bleed as patient is on anticoagulation, no active bleeding. CT abdomen pelvis negative for any acute abdominal pathology or retroperitoneal bleed. Patient received one unit packed RBC transfusion on 7/4. Subsequently hemoglobin remained stable. Anemia panel was consistent with iron deficiency, ferritin 26, iron level 16, Saturation ratio 3  GI was consulted (Dr Marina Goodell), do not feel she is an appropriate candidate for any endoscopic evaluations, recommended lifelong PPI, iron replacement and prn transfusion  Hemoglobin has remained stable at 9.2.   Acute on chronic combined systolic and diastolic CHF: Medically noncompliant, history of ICD, NICM  - Negative 653 on I.'s and O.'s, patient was initially placed on IV diuresis. 2-D echo showed EF of 15% with diffuse hypokinesis, prior echo in 07/2013 head showed EF of 20% with diffuse hypokinesis . Per her brother was not taking her Lasix. Cardiology was consulted and beta blocker was changed to Coreg and oral Lasix was resumed.  History of atrial fibrillation  Currently rate controlled, normal sinus rhythm. Cardiology was consulted and recommended amiodarone daily and resume xarelto  DM type 2 (diabetes mellitus, type 2) patient was placed on sliding-scale insulin  History of stroke with residual effects: With  right-sided hemiparesis per patient's brother, no dysphagia  - Tolerating diet  Hypertension  - Currently stable  Asthma with Acute on chronic respiratory failure  -Currently stable continue duonebs  History of Seizures  - Family had not noticed any seizure activity, continue Keppra  Hypothyroidism  - Continue Synthroid, TSH 1.8      Day of Discharge BP 117/80  Pulse 87  Temp(Src) 98.2 F (36.8 C) (Oral)  Resp 18  Ht 5\' 2"  (1.575 m)  Wt 48.58 kg (107 lb 1.6 oz)  BMI 19.58 kg/m2  SpO2 97%  Physical Exam:  General: Alert and awake, oriented to self , dysarthria CVS: S1-S2 clear, no murmur rubs or gallops  Chest: clear to auscultation bilaterally  Abdomen: soft nontender, nondistended, normal bowel sounds  Extremities: no cyanosis, clubbing or edema noted bilaterally  Neuro: Right-sided hemiparesis, dysarthria from previous stroke  The results of significant diagnostics from this hospitalization (including imaging, microbiology, ancillary and laboratory) are listed below for reference.    LAB RESULTS: Basic Metabolic Panel:  Recent Labs Lab 03/23/14 0335 03/24/14 0925  NA 139 137  K 4.0 3.7  CL 101 100  CO2 21 19  GLUCOSE 144* 217*  BUN 37* 33*  CREATININE 1.61* 1.16*  CALCIUM 9.5 9.1  MG  --  1.9   Liver Function Tests:  Recent Labs Lab 03/21/14 0249  AST 24  ALT 10  ALKPHOS 67  BILITOT 1.5*  PROT 8.2  ALBUMIN 3.1*    Recent Labs Lab 03/21/14 0249  LIPASE 26    Recent Labs Lab 03/21/14 1205  AMMONIA 24   CBC:  Recent Labs Lab 03/21/14 0249  03/22/14 0315  03/22/14 1945 03/24/14 0925  WBC 6.2  --  4.6  --   --  7.8  NEUTROABS 4.7  --   --   --   --   --   HGB 7.8*  < > 8.6*  < > 9.1* 9.2*  HCT 27.3*  < > 29.9*  < > 30.4* 31.1*  MCV 74.0*  --  74.9*  --   --  75.1*  PLT 239  --  244  --   --  244  < > = values in this interval not displayed. Cardiac Enzymes:  Recent Labs Lab 03/21/14 1855 03/21/14 2200  TROPONINI <0.30  <0.30   BNP: No components found with this basename: POCBNP,  CBG:  Recent Labs Lab 03/23/14 2028 03/24/14 0722  GLUCAP 170* 270*    Significant Diagnostic Studies:  Ct Head Wo Contrast  03/21/2014   CLINICAL DATA:  Mental status changes  EXAM: CT HEAD WITHOUT CONTRAST  TECHNIQUE: Contiguous axial images were obtained from the base of the skull through the vertex without intravenous contrast.  COMPARISON:  Head CT 01/10/2014  FINDINGS: Encephalomalacia change again appreciated in a left MCA distribution involving the left temporal, frontal and parietal lobes, consistent with chronic infarction. There is left lateral ventricular hydrocephalus ex vacuo. No further evidence of extra-axial fluid collections nor acute hemorrhage. No mass effect. The calvarium is negative. The visualized paranasal sinuses and mastoid air cells are patent.  IMPRESSION: No acute intracranial abnormality.  Stable chronic changes.   Electronically Signed   By: Salome Holmes M.D.   On: 03/21/2014 13:34   Ct Abdomen Pelvis W Contrast  03/21/2014   CLINICAL DATA:  Abdominal pain, too lethargic to tolerate oral contrast  EXAM: CT ABDOMEN AND PELVIS WITH CONTRAST  TECHNIQUE: Multidetector CT imaging of the abdomen and pelvis was performed using the standard protocol following bolus administration of intravenous contrast.  CONTRAST:  100 mL Omnipaque 300  COMPARISON:  None.  FINDINGS: Severe multichamber cardiomegaly. Very small right pleural effusion.  The liver, spleen, adrenals, pancreas, right kidney are unremarkable. Small stable benign-appearing cyst left kidney otherwise unremarkable.  An inferior vena caval filter appreciated below the renal veins. There is no abdominal aortic aneurysm. Atherosclerotic calcifications are appreciated within the aorta and iliac vessels.  No abdominal pelvic masses, or loculated fluid collections. Trace amount of ascites along the inferior border of the liver. Small amount of free fluid within  the dependent portion of pelvis.  The bowel and appendix are negative, patient status post cholecystectomy.  No abdominal wall nor inguinal hernia.  No aggressive appearing osseous lesions.  IMPRESSION: Mild area is of ascites along the inferior border of the liver and deep in the portion of the pelvis. These findings are nonspecific. There is otherwise no CT evidence of obstructive or inflammatory abnormalities.  Multichamber cardiomegaly  Small right pleural effusion   Electronically Signed   By: Salome Holmes M.D.   On: 03/21/2014 13:52   Dg Abd Acute W/chest  03/21/2014   CLINICAL DATA:  Abdominal pain for 3 days  EXAM: ACUTE ABDOMEN SERIES (ABDOMEN 2 VIEW & CHEST 1 VIEW)  COMPARISON:  02/11/2014 chest radiograph  FINDINGS: Dual-chamber left approach ICD/ pacer leads have unchanged orientation. There is chronic, marked cardiopericardial enlargement. There is bronchial cuffing, cephalized blood flow, and Kerley lines. The retrocardiac lung is obscured by the heart.  The bowel gas pattern is nonobstructed. Cholecystectomy clips are noted. The liver shadow is prominent, possibly hepatic congestion. No evidence for urolithiasis. No pneumoperitoneum.  IMPRESSION: 1. CHF. 2. Negative abdominal radiographs.   Electronically Signed   By: Tiburcio Pea M.D.   On: 03/21/2014 03:33    2D ECHO:  Study Conclusions  - Left ventricle: The cavity size was moderately dilated. Wall thickness was  normal. The estimated ejection fraction was 15%. Diffuse hypokinesis. Doppler parameters are consistent with restrictive physiology, indicative of decreased left ventricular diastolic compliance and/or increased left atrial pressure. Doppler parameters are consistent with high ventricular filling pressure. - Mitral valve: There was severe regurgitation. - Left atrium: The atrium was severely dilated. - Right ventricle: The cavity size was mildly dilated. - Right atrium: The atrium was mildly dilated. - Tricuspid  valve: There was moderate-severe regurgitation. - Pulmonary arteries: Systolic pressure was moderately increased. PA peak pressure: 56 mm Hg (S). - Pericardium, extracardiac: A trivial pericardial effusion was identified.  Impressions:  - Severe global reduction in LV function; restrictive filling; 4 chamber enlargement; severe MR; moderate to severe TR; moderately elevated pulmonary pressures.   Disposition and Follow-up:     Discharge Instructions   Diet - low sodium heart healthy    Complete by:  As directed      Increase activity slowly    Complete by:  As directed             DISPOSITION: Home   DIET:Heart healthy diet     DISCHARGE FOLLOW-UP Follow-up Information   Follow up with Letitia LibraHodgin Jr, Ala Dachhomas Whitson, MD. Schedule an appointment as soon as possible for a visit in 2 weeks. (for hospital follow-up)    Specialty:  Internal Medicine   Contact information:   [address] [place] [address] [phone]       Follow up with Sherryl MangesSteven Klein, MD On 04/17/2014. (for hospital follow-up at 3:00PM)    Specialty:  Cardiology   Contact information:   [address] [place] [address] [phone]       Time spent on Discharge: 45 mins  Signed:   RAI,RIPUDEEP M.D. [facility] 03/24/2014, 11:27 AM Pager: [phone]   **Disclaimer: This note was dictated with voice recognition software. Similar sounding words can inadvertently be transcribed and this note may contain transcription errors which may not have been corrected upon publication of note.**

## 2014-03-24 NOTE — Progress Notes (Signed)
Pt had 6 beats of non-sustaining v-tach. Pt's bp is 113/100. Pt did not receive any of her bp meds today d/t sbp being in the 90s. New orders to check a mg level in the am. Will continue to monitor the pt. Sanda Linger

## 2014-03-25 NOTE — Progress Notes (Signed)
Late entry g code for 03/23/14  04-12-2014 0800  PT G-Codes **NOT FOR INPATIENT CLASS**  Functional Assessment Tool Used clinical judgment  Functional Limitation Changing and maintaining body position  Changing and Maintaining Body Position Current Status (R8309) CJ  Changing and Maintaining Body Position Goal Status [phone]) CI  [facility] [phone] (pager)

## 2014-03-30 ENCOUNTER — Encounter: Payer: Self-pay | Admitting: Internal Medicine

## 2014-03-30 ENCOUNTER — Ambulatory Visit: Payer: Medicare Other | Admitting: Family

## 2014-03-30 ENCOUNTER — Telehealth: Payer: Self-pay | Admitting: Internal Medicine

## 2014-03-30 NOTE — Telephone Encounter (Signed)
Scheduled for a hospital follow up and did not come

## 2014-03-30 NOTE — Telephone Encounter (Signed)
Please mail pt a recall letter to reschedule appointment.

## 2014-03-30 NOTE — Telephone Encounter (Signed)
Letter sent.

## 2014-04-02 ENCOUNTER — Telehealth: Payer: Self-pay

## 2014-04-02 DIAGNOSIS — Z0000 Encounter for general adult medical examination without abnormal findings: Secondary | ICD-10-CM

## 2014-04-02 DIAGNOSIS — I10 Essential (primary) hypertension: Secondary | ICD-10-CM

## 2014-04-02 NOTE — Telephone Encounter (Signed)
Diabetic Bundle:  Pt needs to have lipid panel done at 04-10-14 appt.  Lab order placed

## 2014-04-10 ENCOUNTER — Ambulatory Visit: Payer: Self-pay | Admitting: Family

## 2014-04-13 ENCOUNTER — Ambulatory Visit: Payer: Self-pay | Admitting: Family

## 2014-04-14 ENCOUNTER — Inpatient Hospital Stay (HOSPITAL_BASED_OUTPATIENT_CLINIC_OR_DEPARTMENT_OTHER)
Admission: EM | Admit: 2014-04-14 | Discharge: 2014-04-17 | DRG: 563 | Disposition: A | Discharge status: Home Health | Payer: Medicare Other | Attending: Internal Medicine | Admitting: Internal Medicine

## 2014-04-14 ENCOUNTER — Emergency Department (HOSPITAL_BASED_OUTPATIENT_CLINIC_OR_DEPARTMENT_OTHER): Payer: Medicare Other

## 2014-04-14 ENCOUNTER — Telehealth: Payer: Self-pay | Admitting: *Deleted

## 2014-04-14 ENCOUNTER — Encounter (HOSPITAL_BASED_OUTPATIENT_CLINIC_OR_DEPARTMENT_OTHER): Payer: Self-pay | Admitting: Emergency Medicine

## 2014-04-14 DIAGNOSIS — S82401A Unspecified fracture of shaft of right fibula, initial encounter for closed fracture: Secondary | ICD-10-CM

## 2014-04-14 DIAGNOSIS — Z9119 Patient's noncompliance with other medical treatment and regimen: Secondary | ICD-10-CM

## 2014-04-14 DIAGNOSIS — Z7901 Long term (current) use of anticoagulants: Secondary | ICD-10-CM

## 2014-04-14 DIAGNOSIS — Z794 Long term (current) use of insulin: Secondary | ICD-10-CM

## 2014-04-14 DIAGNOSIS — Z8249 Family history of ischemic heart disease and other diseases of the circulatory system: Secondary | ICD-10-CM

## 2014-04-14 DIAGNOSIS — IMO0002 Reserved for concepts with insufficient information to code with codable children: Secondary | ICD-10-CM

## 2014-04-14 DIAGNOSIS — W19XXXA Unspecified fall, initial encounter: Secondary | ICD-10-CM

## 2014-04-14 DIAGNOSIS — S82843A Displaced bimalleolar fracture of unspecified lower leg, initial encounter for closed fracture: Principal | ICD-10-CM | POA: Diagnosis present

## 2014-04-14 DIAGNOSIS — I69320 Aphasia following cerebral infarction: Secondary | ICD-10-CM

## 2014-04-14 DIAGNOSIS — I69920 Aphasia following unspecified cerebrovascular disease: Secondary | ICD-10-CM

## 2014-04-14 DIAGNOSIS — I440 Atrioventricular block, first degree: Secondary | ICD-10-CM | POA: Diagnosis present

## 2014-04-14 DIAGNOSIS — D509 Iron deficiency anemia, unspecified: Secondary | ICD-10-CM | POA: Diagnosis present

## 2014-04-14 DIAGNOSIS — I10 Essential (primary) hypertension: Secondary | ICD-10-CM | POA: Diagnosis present

## 2014-04-14 DIAGNOSIS — E039 Hypothyroidism, unspecified: Secondary | ICD-10-CM | POA: Diagnosis present

## 2014-04-14 DIAGNOSIS — Z91199 Patient's noncompliance with other medical treatment and regimen due to unspecified reason: Secondary | ICD-10-CM

## 2014-04-14 DIAGNOSIS — I6930 Unspecified sequelae of cerebral infarction: Secondary | ICD-10-CM

## 2014-04-14 DIAGNOSIS — D6859 Other primary thrombophilia: Secondary | ICD-10-CM | POA: Diagnosis present

## 2014-04-14 DIAGNOSIS — R4182 Altered mental status, unspecified: Secondary | ICD-10-CM

## 2014-04-14 DIAGNOSIS — Z87891 Personal history of nicotine dependence: Secondary | ICD-10-CM

## 2014-04-14 DIAGNOSIS — N390 Urinary tract infection, site not specified: Secondary | ICD-10-CM | POA: Diagnosis not present

## 2014-04-14 DIAGNOSIS — Z95810 Presence of automatic (implantable) cardiac defibrillator: Secondary | ICD-10-CM

## 2014-04-14 DIAGNOSIS — S82409A Unspecified fracture of shaft of unspecified fibula, initial encounter for closed fracture: Secondary | ICD-10-CM

## 2014-04-14 DIAGNOSIS — I509 Heart failure, unspecified: Secondary | ICD-10-CM | POA: Diagnosis present

## 2014-04-14 DIAGNOSIS — I428 Other cardiomyopathies: Secondary | ICD-10-CM | POA: Diagnosis present

## 2014-04-14 DIAGNOSIS — E119 Type 2 diabetes mellitus without complications: Secondary | ICD-10-CM | POA: Diagnosis present

## 2014-04-14 DIAGNOSIS — S82841A Displaced bimalleolar fracture of right lower leg, initial encounter for closed fracture: Secondary | ICD-10-CM

## 2014-04-14 DIAGNOSIS — I69959 Hemiplegia and hemiparesis following unspecified cerebrovascular disease affecting unspecified side: Secondary | ICD-10-CM

## 2014-04-14 DIAGNOSIS — I4891 Unspecified atrial fibrillation: Secondary | ICD-10-CM | POA: Diagnosis present

## 2014-04-14 DIAGNOSIS — G9340 Encephalopathy, unspecified: Secondary | ICD-10-CM

## 2014-04-14 DIAGNOSIS — D649 Anemia, unspecified: Secondary | ICD-10-CM | POA: Diagnosis present

## 2014-04-14 DIAGNOSIS — I639 Cerebral infarction, unspecified: Secondary | ICD-10-CM | POA: Diagnosis present

## 2014-04-14 DIAGNOSIS — I482 Chronic atrial fibrillation, unspecified: Secondary | ICD-10-CM

## 2014-04-14 DIAGNOSIS — F411 Generalized anxiety disorder: Secondary | ICD-10-CM | POA: Diagnosis present

## 2014-04-14 DIAGNOSIS — Z86718 Personal history of other venous thrombosis and embolism: Secondary | ICD-10-CM

## 2014-04-14 DIAGNOSIS — I5022 Chronic systolic (congestive) heart failure: Secondary | ICD-10-CM | POA: Diagnosis present

## 2014-04-14 DIAGNOSIS — S82402R Unspecified fracture of shaft of left fibula, subsequent encounter for open fracture type IIIA, IIIB, or IIIC with malunion: Secondary | ICD-10-CM

## 2014-04-14 DIAGNOSIS — W06XXXA Fall from bed, initial encounter: Secondary | ICD-10-CM | POA: Diagnosis present

## 2014-04-14 HISTORY — DX: Anxiety disorder, unspecified: F41.9

## 2014-04-14 LAB — URINALYSIS, ROUTINE W REFLEX MICROSCOPIC
BILIRUBIN URINE: NEGATIVE
Glucose, UA: NEGATIVE mg/dL
Ketones, ur: NEGATIVE mg/dL
Nitrite: NEGATIVE
Protein, ur: NEGATIVE mg/dL
Specific Gravity, Urine: 1.009 (ref 1.005–1.030)
UROBILINOGEN UA: 4 mg/dL — AB (ref 0.0–1.0)
pH: 6.5 (ref 5.0–8.0)

## 2014-04-14 LAB — CBC WITH DIFFERENTIAL/PLATELET
Basophils Absolute: 0 10*3/uL (ref 0.0–0.1)
Basophils Relative: 1 % (ref 0–1)
EOS ABS: 0.1 10*3/uL (ref 0.0–0.7)
Eosinophils Relative: 2 % (ref 0–5)
HCT: 32.3 % — ABNORMAL LOW (ref 36.0–46.0)
HEMOGLOBIN: 9.2 g/dL — AB (ref 12.0–15.0)
LYMPHS ABS: 1.1 10*3/uL (ref 0.7–4.0)
LYMPHS PCT: 18 % (ref 12–46)
MCH: 22.5 pg — AB (ref 26.0–34.0)
MCHC: 28.5 g/dL — ABNORMAL LOW (ref 30.0–36.0)
MCV: 79 fL (ref 78.0–100.0)
MONOS PCT: 12 % (ref 3–12)
Monocytes Absolute: 0.7 10*3/uL (ref 0.1–1.0)
Neutro Abs: 3.9 10*3/uL (ref 1.7–7.7)
Neutrophils Relative %: 68 % (ref 43–77)
PLATELETS: 250 10*3/uL (ref 150–400)
RBC: 4.09 MIL/uL (ref 3.87–5.11)
RDW: 24.3 % — ABNORMAL HIGH (ref 11.5–15.5)
WBC: 5.7 10*3/uL (ref 4.0–10.5)

## 2014-04-14 LAB — COMPREHENSIVE METABOLIC PANEL
ALK PHOS: 65 U/L (ref 39–117)
ALT: 17 U/L (ref 0–35)
AST: 37 U/L (ref 0–37)
Albumin: 3.1 g/dL — ABNORMAL LOW (ref 3.5–5.2)
Anion gap: 14 (ref 5–15)
BUN: 15 mg/dL (ref 6–23)
CO2: 29 meq/L (ref 19–32)
Calcium: 9.7 mg/dL (ref 8.4–10.5)
Chloride: 102 mEq/L (ref 96–112)
Creatinine, Ser: 1.2 mg/dL — ABNORMAL HIGH (ref 0.50–1.10)
GFR, EST AFRICAN AMERICAN: 58 mL/min — AB (ref >90)
GFR, EST NON AFRICAN AMERICAN: 50 mL/min — AB (ref >90)
GLUCOSE: 114 mg/dL — AB (ref 70–99)
POTASSIUM: 3.6 meq/L — AB (ref 3.7–5.3)
SODIUM: 145 meq/L (ref 137–147)
TOTAL PROTEIN: 8.3 g/dL (ref 6.0–8.3)
Total Bilirubin: 2 mg/dL — ABNORMAL HIGH (ref 0.3–1.2)

## 2014-04-14 LAB — PROTIME-INR
INR: 4.52 — AB (ref 0.00–1.49)
Prothrombin Time: 42.9 seconds — ABNORMAL HIGH (ref 11.6–15.2)

## 2014-04-14 LAB — URINE MICROSCOPIC-ADD ON

## 2014-04-14 LAB — CBG MONITORING, ED
GLUCOSE-CAPILLARY: 122 mg/dL — AB (ref 70–99)
Glucose-Capillary: 104 mg/dL — ABNORMAL HIGH (ref 70–99)

## 2014-04-14 MED ORDER — DEXTROSE 5 % IV SOLN
1.0000 g | Freq: Once | INTRAVENOUS | Status: AC
  Administered 2014-04-14: 1 g via INTRAVENOUS

## 2014-04-14 MED ORDER — CEFTRIAXONE SODIUM 1 G IJ SOLR
INTRAMUSCULAR | Status: AC
  Filled 2014-04-14: qty 10

## 2014-04-14 MED ORDER — ACETAMINOPHEN 325 MG PO TABS
650.0000 mg | ORAL_TABLET | Freq: Once | ORAL | Status: DC
  Filled 2014-04-14: qty 2

## 2014-04-14 MED ORDER — MORPHINE SULFATE 4 MG/ML IJ SOLN
4.0000 mg | Freq: Once | INTRAMUSCULAR | Status: AC
  Administered 2014-04-14: 4 mg via INTRAVENOUS
  Filled 2014-04-14: qty 1

## 2014-04-14 NOTE — ED Notes (Signed)
Pt fell off of edge of bed on Saturday - 3 days ago - "rolling" her right ankle.  Pain and swelling to right ankle ever since.  Per EMS - Sent to ED by pmd for eval and r/o blood clot.

## 2014-04-14 NOTE — ED Notes (Signed)
I called [facility] office and they confirmed that the medication list in Epic is current and correct.

## 2014-04-14 NOTE — Telephone Encounter (Signed)
Received message from [place], [place] with [facility] stating they are stopping pt's PT. States pt had a fall 1 week ago and is unable to bear weight on her right leg due to possible sprained ankle. States they will need new orders when pt is to resume PT.  Spoke with Shawna Orleans, she reports pt has swelling of right ankle and calf and was able to pivot her weight to bedside commode today but is still in great deal of pain.  Attempted to reach pt and was told that she was in a "mood" today and would not talk with anyone. Spoke with pt's son, Feliz Beam [phone]) and advised him per verbal from Provider to have pt evaluated in the ER to r/o fracture and DVT.  Pt's son voices understanding and will contact EMS non-emergent transport first. He states he is at work and will have his uncle get transportation started to ER.

## 2014-04-14 NOTE — ED Notes (Signed)
Received a call from radiology that they are not able to do the lateral chest film because pt cannot put her arms over her head.

## 2014-04-14 NOTE — ED Notes (Signed)
Patient transported to X-ray 

## 2014-04-14 NOTE — ED Provider Notes (Signed)
CSN: [id number]     Arrival date & time 04/14/14  1632 History   First MD Initiated Contact with Patient 04/14/14 1641     Chief Complaint  Patient presents with  . Ankle Injury     (Consider location/radiation/quality/duration/timing/severity/associated sxs/prior Treatment) Patient is a 58 y.o. female presenting with lower extremity injury and fall. The history is provided by the patient and the EMS personnel.  Ankle Injury Pertinent negatives include no chest pain, no abdominal pain, no headaches and no shortness of breath.  Fall This is a new problem. Episode onset: 3 days ago. Episode frequency: once. The problem has been resolved. Pertinent negatives include no chest pain, no abdominal pain, no headaches and no shortness of breath. Nothing aggravates the symptoms. Nothing relieves the symptoms. She has tried nothing for the symptoms. The treatment provided no relief.    Past Medical History  Diagnosis Date  . Seizures   . Hypertension   . Stroke     2007 - R sided weakness and expressive aphasia with h/o behavioral problems related to this  . Asthma   . Diabetes mellitus   . Chronic systolic CHF (congestive heart failure)   . Atrial fibrillation     a. On Xarelto after having DVT (prev felt to be poor anticoag cand 2/2 noncompliance).  . History of DVT (deep vein thrombosis)     a. 06/2012: RLE DVT, + protein C deficiency, placed on Xarelto. s/p IVC filter  . Noncompliance     Due to mental status, family has had to coax her to take medicines  . Hypothyroidism   . VT (ventricular tachycardia)     a. h/o ICD ~2007-2008. b. Adm 08/2012 with UTI, had VT/ICD shock x 5 (hypokalemic);  c. 06/2013 s/p ICD Gen change: SJM DC ICD ser #: [id number]  . Protein C deficiency   . NICM (nonischemic cardiomyopathy)     a. s/p AICD ~2008 ([employer]) - previous care [facility]. No known hx CAD. b. EF 10% by echo 04/2012;  c. 06/2013 s/p ICD Gen change: SJM DC ICD ser #: [id number]  . 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 Panic attacks   . Anxiety    Past Surgical History  Procedure Laterality Date  . Cholecystectomy    . Appendectomy    . Pacemaker insertion      [employer] [facility]  . Vena cava filter placement  07/19/2012    Procedure: INSERTION VENA-CAVA FILTER;  Surgeon: Larina Earthly, MD;  Location: [facility] OR;  Service: Vascular;  Laterality: [name];  . Insert / replace / remove pacemaker  2014    ICD. St. Jude; inserted 2008 [facility]); gen change 2014 SK  . Abdominal hysterectomy     Family History  Problem Relation Age of Onset  . Hypertension Mother   . Coronary artery disease Neg Hx    History  Substance Use Topics  . Smoking status: Former Games developer  . Smokeless tobacco: Never Used  . Alcohol Use: No   OB History   Grav Para Term Preterm Abortions TAB SAB Ect Mult Living                 Review of Systems  Constitutional: Negative for fever and fatigue.  HENT: Negative for congestion and drooling.   Eyes: Negative for pain.  Respiratory: Negative for cough and shortness of breath.   Cardiovascular: Negative for chest pain.  Gastrointestinal: Negative for nausea, vomiting, abdominal pain and  diarrhea.  Genitourinary: Negative for dysuria and hematuria.  Musculoskeletal: Negative for back pain, gait problem and neck pain.  Skin: Negative for color change.  Neurological: Negative for dizziness and headaches.       Falls  Hematological: Negative for adenopathy.  Psychiatric/Behavioral: Negative for behavioral problems.  All other systems reviewed and are negative.     Allergies  Penicillins and Sulfa antibiotics  Home Medications   Prior to Admission medications   Medication Sig Start Date End Date Taking? Authorizing Provider  acetaminophen (TYLENOL) 325 MG tablet Take 650 mg by mouth every 6 (six) hours as needed for pain.    Historical Provider, MD  albuterol (PROVENTIL) (2.5 MG/3ML) 0.083% nebulizer solution Take 2.5 mg by nebulization every 6 (six) hours  as needed. For cough or wheeze    Historical Provider, MD  ALPRAZolam Prudy Feeler(XANAX) 1 MG tablet Take 1 tablet (1 mg total) by mouth at bedtime as needed for anxiety. 03/24/14   Ripudeep Jenna LuoK Rai, MD  amiodarone (PACERONE) 200 MG tablet Take 1 tablet (200 mg total) by mouth daily. 03/24/14   Ripudeep Jenna LuoK Rai, MD  carvedilol (COREG) 3.125 MG tablet Take 1 tablet (3.125 mg total) by mouth 2 (two) times daily with a meal. 03/24/14   Ripudeep Jenna LuoK Rai, MD  digoxin (LANOXIN) 0.125 MG tablet Take 1 tablet (0.125 mg total) by mouth daily. 09/01/13   Marinda ElkAbraham Feliz Ortiz, MD  escitalopram (LEXAPRO) 20 MG tablet Take 1 tablet (20 mg total) by mouth daily. 09/01/13   Marinda ElkAbraham Feliz Ortiz, MD  furosemide (LASIX) 40 MG tablet Take 1 tablet (40 mg total) by mouth 2 (two) times daily. 03/24/14   Ripudeep Jenna LuoK Rai, MD  insulin lispro (HUMALOG) 100 UNIT/ML injection Inject into the skin 3 (three) times daily before meals. On sliding scale    Historical Provider, MD  iron polysaccharides (NIFEREX) 150 MG capsule Take 1 capsule (150 mg total) by mouth daily. 03/24/14   Ripudeep Jenna LuoK Rai, MD  levETIRAcetam (KEPPRA) 1000 MG tablet Take 2,000 mg by mouth 2 (two) times daily.    Historical Provider, MD  levothyroxine (SYNTHROID, LEVOTHROID) 50 MCG tablet Take 50 mcg by mouth daily before breakfast.    Historical Provider, MD  lisinopril (PRINIVIL,ZESTRIL) 2.5 MG tablet Take 1 tablet (2.5 mg total) by mouth daily. 03/24/14   Ripudeep Jenna LuoK Rai, MD  metolazone (ZAROXOLYN) 2.5 MG tablet TAKE 1 TABLET BY MOUTH AS NEEDED. FOR FLUID RETENTION 12/25/13   Dolores Pattyaniel R [name], MD  oxyCODONE-acetaminophen (PERCOCET) 10-325 MG per tablet Take 1 tablet by mouth every 8 (eight) hours as needed for pain. 03/24/14   Ripudeep Jenna LuoK Rai, MD  pantoprazole (PROTONIX) 40 MG tablet Take 1 tablet (40 mg total) by mouth daily. 03/24/14   Ripudeep Jenna LuoK Rai, MD  polyethylene glycol (MIRALAX / GLYCOLAX) packet Take 17 g by mouth daily as needed. For constipation    Historical Provider, MD  potassium  chloride 20 MEQ/15ML (10%) solution Take 15 mLs (20 mEq total) by mouth daily. 09/01/13   Marinda ElkAbraham Feliz Ortiz, MD  QUEtiapine Fumarate (SEROQUEL XR) 150 MG 24 hr tablet Take 1 tablet (150 mg total) by mouth at bedtime. 09/01/13   Marinda ElkAbraham Feliz Ortiz, MD  Rivaroxaban (XARELTO) 20 MG TABS tablet Take 20 mg by mouth daily.    Historical Provider, MD  Vitamin D, Ergocalciferol, (DRISDOL) 50000 UNITS CAPS capsule Take 50,000 Units by mouth every 30 (thirty) days.    Historical Provider, MD   BP 110/47  Pulse 78  Temp(Src)  98.4 F (36.9 C) (Oral)  Resp 16  Ht 5" (0.127 m)  Wt 140 lb (63.504 kg)  BMI 3937.26 kg/m2  SpO2 98% Physical Exam  Nursing note and vitals reviewed. Constitutional: She appears well-developed and well-nourished.  HENT:  Head: Normocephalic.  Mouth/Throat: Oropharynx is clear and moist. No oropharyngeal exudate.  Tympanic membranes clear bilaterally.  Eyes: Conjunctivae and EOM are normal. Pupils are equal, round, and reactive to light.  Neck: Normal range of motion. Neck supple.  No vertebral tenderness to palpation noted.  Cardiovascular: Normal rate, regular rhythm and intact distal pulses.  Exam reveals no gallop and no friction rub.   Murmur heard. Pulmonary/Chest: Effort normal and breath sounds normal. No respiratory distress. She has no wheezes.  Abdominal: Soft. Bowel sounds are normal. There is no tenderness. There is no rebound and no guarding.  Musculoskeletal: Normal range of motion. She exhibits edema and tenderness.  Diffuse swelling to the right lower extremity. Mild pitting edema extending to the knee. Diffuse tenderness to palpation of the right lower extremity which does not appear to localize.  The patient appears to have normal strength in the left lower and left upper extremity.  2+ distal pulses in the lower and upper extremities.  Neurological: She is alert.  A/o x1.   Skin: Skin is warm and dry.  Psychiatric: She has a normal mood and  affect. Her behavior is normal.    ED Course  Procedures (including critical care time) Labs Review Labs Reviewed  CBC WITH DIFFERENTIAL - Abnormal; Notable for the following:    Hemoglobin 9.2 (*)    HCT 32.3 (*)    MCH 22.5 (*)    MCHC 28.5 (*)    RDW 24.3 (*)    All other components within normal limits  COMPREHENSIVE METABOLIC PANEL - Abnormal; Notable for the following:    Potassium 3.6 (*)    Glucose, Bld 114 (*)    Creatinine, Ser 1.20 (*)    Albumin 3.1 (*)    Total Bilirubin 2.0 (*)    GFR calc non Af Amer 50 (*)    GFR calc Af Amer 58 (*)    All other components within normal limits  URINALYSIS, ROUTINE W REFLEX MICROSCOPIC - Abnormal; Notable for the following:    Hgb urine dipstick MODERATE (*)    Urobilinogen, UA 4.0 (*)    Leukocytes, UA MODERATE (*)    All other components within normal limits  PROTIME-INR - Abnormal; Notable for the following:    Prothrombin Time 42.9 (*)    INR 4.52 (*)    All other components within normal limits  URINE MICROSCOPIC-ADD ON - Abnormal; Notable for the following:    Bacteria, UA MANY (*)    All other components within normal limits  LIPID PANEL - Abnormal; Notable for the following:    HDL 20 (*)    All other components within normal limits  CBC - Abnormal; Notable for the following:    Hemoglobin 8.6 (*)    HCT 30.4 (*)    MCH 22.2 (*)    MCHC 28.3 (*)    RDW 22.9 (*)    All other components within normal limits  CREATININE, SERUM - Abnormal; Notable for the following:    GFR calc non Af Amer 65 (*)    GFR calc Af Amer 76 (*)    All other components within normal limits  PRO B NATRIURETIC PEPTIDE - Abnormal; Notable for the following:    Pro B  Natriuretic peptide (BNP) 10371.0 (*)    All other components within normal limits  PROTIME-INR - Abnormal; Notable for the following:    Prothrombin Time 36.0 (*)    INR 3.61 (*)    All other components within normal limits  GLUCOSE, CAPILLARY - Abnormal; Notable for the  following:    Glucose-Capillary 106 (*)    All other components within normal limits  GLUCOSE, CAPILLARY - Abnormal; Notable for the following:    Glucose-Capillary 105 (*)    All other components within normal limits  GLUCOSE, CAPILLARY - Abnormal; Notable for the following:    Glucose-Capillary 102 (*)    All other components within normal limits  CBG MONITORING, ED - Abnormal; Notable for the following:    Glucose-Capillary 104 (*)    All other components within normal limits  CBG MONITORING, ED - Abnormal; Notable for the following:    Glucose-Capillary 122 (*)    All other components within normal limits  URINE CULTURE  HEMOGLOBIN A1C    Imaging Review Dg Chest 1 View  04/14/2014   CLINICAL DATA:  Fall  EXAM: CHEST - 1 VIEW  COMPARISON:  Prior radiograph from 03/21/2014  FINDINGS: Left-sided pacemaker/AICD is unchanged. Severe cardiomegaly grossly stable. Mediastinal silhouette within normal limits.  The lungs are normally inflated. Mild diffuse pulmonary vascular congestion without frank pulmonary edema. No definite pleural effusion. No focal infiltrate. No pneumothorax.  No acute osseous abnormality identified.  IMPRESSION: Severe cardiomegaly with mild diffuse pulmonary vascular congestion without overt pulmonary edema. No other acute cardiopulmonary abnormality.   Electronically Signed   By: Rise Mu M.D.   On: 04/14/2014 19:29   Dg Hip Complete Right  04/14/2014   CLINICAL DATA:  Diffuse right lower extremity pain secondary to a fall from the bed 3 days ago.  EXAM: RIGHT HIP - COMPLETE 2+ VIEW  COMPARISON:  None.  FINDINGS: There is no evidence of hip fracture or dislocation. There is no evidence of arthropathy or other focal bone abnormality.  IMPRESSION: No osseous abnormality.   Electronically Signed   By: Geanie Cooley M.D.   On: 04/14/2014 19:38   Dg Knee 2 Views Right  04/14/2014   CLINICAL DATA:  Fall from bed.  Diffuse right lower extremity pain.  EXAM: RIGHT  KNEE - 1-2 VIEW  COMPARISON:  07/18/2012  FINDINGS: Diffuse bony demineralization. Poor definition of fat plane surround knee make today's exam indeterminate for knee effusion. Suspected diffuse subcutaneous edema. No cortical discontinuity identified to suggest fracture.  IMPRESSION: 1. Diffuse bony demineralization. 2. Suspected subcutaneous edema. Indeterminate exam for knee effusion.   Electronically Signed   By: Herbie Baltimore M.D.   On: 04/14/2014 19:36   Dg Tibia/fibula Right  04/14/2014   CLINICAL DATA:  Fall.  EXAM: RIGHT TIBIA AND FIBULA - 2 VIEW  COMPARISON:  None.  FINDINGS: Slightly angulated fracture of the distal right fibular shaft is present. Tibia is intact.  IMPRESSION: Slightly angulated fracture of the distal right fibular shaft.   Electronically Signed   By: Maisie Fus  Register   On: 04/14/2014 19:36   Dg Ankle 2 Views Right  04/14/2014   CLINICAL DATA:  Fall  EXAM: RIGHT ANKLE - 2 VIEW  COMPARISON:  None.  FINDINGS: There is an acute transverse fracture through the distal right fibular shaft. Ankle mortise is grossly approximated. No other definite fracture.  Mild diffuse soft tissue swelling present left ankle.  Diffuse osteopenia present.  IMPRESSION: Acute nondisplaced transverse fracture of the  distal right fibular shaft.   Electronically Signed   By: Rise Mu M.D.   On: 04/14/2014 19:35   Ct Head Wo Contrast  04/15/2014   CLINICAL DATA:  Question right temporal stroke  EXAM: CT HEAD WITHOUT CONTRAST  TECHNIQUE: Contiguous axial images were obtained from the base of the skull through the vertex without intravenous contrast.  COMPARISON:  CT 04/14/2014  FINDINGS: Large territory chronic infarct in the left middle cerebral artery territory involving the frontal temporal and parietal lobes as well as the left basal ganglia.  Negative for acute infarct. Right temporal lobe area appears normal on the current study. Artifact is present on the prior study. Negative for  hemorrhage or mass.  IMPRESSION: Large territory chronic left MCA infarct.  No acute abnormality.   Electronically Signed   By: Marlan Palau M.D.   On: 04/15/2014 09:28   Ct Head Wo Contrast  04/14/2014   CLINICAL DATA:  Fall. Head injury. Prior stroke, nonverbal patient.  EXAM: CT HEAD WITHOUT CONTRAST  TECHNIQUE: Contiguous axial images were obtained from the base of the skull through the vertex without intravenous contrast.  COMPARISON:  Multiple exams, including 03/21/2014  FINDINGS: I am unable to locate the report from the 03/21/2014 exam. I have Engineer, production.  Large prior left MCA distribution stroke noted with associated wall Peyton Najjar and degeneration causing a small left cerebral peduncle. Stroke extends up into the white matter the vertex, with some medial involvement of the left frontal lobe as shown on image 25 of series 2, stable.  The large region of involvement appears similar to the prior exam.  Reduced gray-white differentiation inferiorly in the right temporal lobe. This is less striking on series 4 is compared to series 2 and may partially be due to motion artifact and streak artifact, but appears asymmetric enough to potentially represent a early stroke.  No intracranial hemorrhage or mass lesion identified.  IMPRESSION: 1. Hypodensity and poor gray-white differentiation inferiorly in the right temporal lobe such that an acute CVA involving the right temporal lobe cannot be readily excluded. This is in area which can be subject to artifact simulating a stroke. Consider MRI for further characterization. 2. Old large left MCA distribution stroke.  These results were called by telephone at the time of interpretation on 04/14/2014 at 7:31 pm to Dr. Randa Spike, Romeo Apple , who verbally acknowledged these results.   Electronically Signed   By: Herbie Baltimore M.D.   On: 04/14/2014 19:34   Dg Foot 2 Views Right  04/14/2014   CLINICAL DATA:  Fall.  EXAM: RIGHT FOOT - 2 VIEW   COMPARISON:  None.  FINDINGS: Diffuse osteopenia and degenerative change. No acute bony or joint abnormality identified.  IMPRESSION: No acute bony or joint abnormality. Diffuse osteopenia and degenerative change.   Electronically Signed   By: Maisie Fus  Register   On: 04/14/2014 19:34     EKG Interpretation   Date/Time:  Tuesday April 14 2014 17:31:19 EDT Ventricular Rate:  75 PR Interval:  232 QRS Duration: 108 QT Interval:  444 QTC Calculation: 495 R Axis:   24 Text Interpretation:  Sinus rhythm with 1st degree A-V block with  Premature atrial complexes with Abberant conduction Possible Left atrial  enlargement Nonspecific T wave abnormality Confirmed by Vivek Grealish  MD,  Tyree Vandruff ([id number]) on 04/14/2014 5:39:14 PM      MDM   Final diagnoses:  Right fibular fracture, closed, initial encounter  Fall, initial encounter  UTI (lower urinary tract  infection)    5:14 PM 58 y.o. female w hx of seizures, CVA w/ right sided hemiparesis and expressive aphasia, CHF, DM, afib, DVT on xarelto who pw a reported mechanical fall from the bedside onto her right side which occurred 3 days ago. This report is from EMS. The patient is an incredibly poor historian and does not remember falling. Her history seems to change and does not appear to be accurate. Her vital signs are unremarkable here. Will get screening labs and imaging.  Case discussed w/ Dr. Carola Frost (ortho). Questionable acute infarct on CT. Will admit to hospitalist at [facility]. Case discussed w/ Dr. Lovell Sheehan. Will transfer.     Junius Argyle, MD 04/15/14 1026

## 2014-04-14 NOTE — Progress Notes (Signed)
Pt arrived to floor via [facility] in NAD. Pt alert to self, states she does not know where she is. Pt oriented to room and floor.

## 2014-04-14 NOTE — ED Notes (Signed)
Assisted by EMT Lequita Halt

## 2014-04-14 NOTE — ED Notes (Signed)
MD at bedside. 

## 2014-04-14 NOTE — Progress Notes (Signed)
Per report from RN at [facility], pt did not pass bedside swallow evaluation.

## 2014-04-15 ENCOUNTER — Encounter (HOSPITAL_COMMUNITY): Payer: Self-pay | Admitting: *Deleted

## 2014-04-15 ENCOUNTER — Inpatient Hospital Stay (HOSPITAL_COMMUNITY): Payer: Medicare Other

## 2014-04-15 DIAGNOSIS — N390 Urinary tract infection, site not specified: Secondary | ICD-10-CM

## 2014-04-15 DIAGNOSIS — E119 Type 2 diabetes mellitus without complications: Secondary | ICD-10-CM | POA: Diagnosis present

## 2014-04-15 DIAGNOSIS — Z8249 Family history of ischemic heart disease and other diseases of the circulatory system: Secondary | ICD-10-CM | POA: Diagnosis not present

## 2014-04-15 DIAGNOSIS — Z9119 Patient's noncompliance with other medical treatment and regimen: Secondary | ICD-10-CM

## 2014-04-15 DIAGNOSIS — W06XXXA Fall from bed, initial encounter: Secondary | ICD-10-CM | POA: Diagnosis present

## 2014-04-15 DIAGNOSIS — I69959 Hemiplegia and hemiparesis following unspecified cerebrovascular disease affecting unspecified side: Secondary | ICD-10-CM | POA: Diagnosis not present

## 2014-04-15 DIAGNOSIS — I428 Other cardiomyopathies: Secondary | ICD-10-CM | POA: Diagnosis present

## 2014-04-15 DIAGNOSIS — E039 Hypothyroidism, unspecified: Secondary | ICD-10-CM | POA: Diagnosis present

## 2014-04-15 DIAGNOSIS — Z7901 Long term (current) use of anticoagulants: Secondary | ICD-10-CM | POA: Diagnosis not present

## 2014-04-15 DIAGNOSIS — D509 Iron deficiency anemia, unspecified: Secondary | ICD-10-CM | POA: Diagnosis present

## 2014-04-15 DIAGNOSIS — I440 Atrioventricular block, first degree: Secondary | ICD-10-CM | POA: Diagnosis present

## 2014-04-15 DIAGNOSIS — I4891 Unspecified atrial fibrillation: Secondary | ICD-10-CM

## 2014-04-15 DIAGNOSIS — Z95810 Presence of automatic (implantable) cardiac defibrillator: Secondary | ICD-10-CM | POA: Diagnosis not present

## 2014-04-15 DIAGNOSIS — S82843A Displaced bimalleolar fracture of unspecified lower leg, initial encounter for closed fracture: Secondary | ICD-10-CM | POA: Diagnosis present

## 2014-04-15 DIAGNOSIS — F411 Generalized anxiety disorder: Secondary | ICD-10-CM | POA: Diagnosis present

## 2014-04-15 DIAGNOSIS — I69920 Aphasia following unspecified cerebrovascular disease: Secondary | ICD-10-CM | POA: Diagnosis not present

## 2014-04-15 DIAGNOSIS — Z87891 Personal history of nicotine dependence: Secondary | ICD-10-CM | POA: Diagnosis not present

## 2014-04-15 DIAGNOSIS — S82409A Unspecified fracture of shaft of unspecified fibula, initial encounter for closed fracture: Secondary | ICD-10-CM

## 2014-04-15 DIAGNOSIS — D6859 Other primary thrombophilia: Secondary | ICD-10-CM | POA: Diagnosis present

## 2014-04-15 DIAGNOSIS — Z91199 Patient's noncompliance with other medical treatment and regimen due to unspecified reason: Secondary | ICD-10-CM | POA: Diagnosis not present

## 2014-04-15 DIAGNOSIS — I639 Cerebral infarction, unspecified: Secondary | ICD-10-CM | POA: Diagnosis present

## 2014-04-15 DIAGNOSIS — Z86718 Personal history of other venous thrombosis and embolism: Secondary | ICD-10-CM | POA: Diagnosis not present

## 2014-04-15 DIAGNOSIS — I5022 Chronic systolic (congestive) heart failure: Secondary | ICD-10-CM | POA: Diagnosis present

## 2014-04-15 DIAGNOSIS — G9340 Encephalopathy, unspecified: Secondary | ICD-10-CM

## 2014-04-15 DIAGNOSIS — I509 Heart failure, unspecified: Secondary | ICD-10-CM | POA: Diagnosis present

## 2014-04-15 DIAGNOSIS — IMO0002 Reserved for concepts with insufficient information to code with codable children: Secondary | ICD-10-CM | POA: Diagnosis not present

## 2014-04-15 DIAGNOSIS — D649 Anemia, unspecified: Secondary | ICD-10-CM | POA: Diagnosis present

## 2014-04-15 DIAGNOSIS — I10 Essential (primary) hypertension: Secondary | ICD-10-CM | POA: Diagnosis present

## 2014-04-15 DIAGNOSIS — Z794 Long term (current) use of insulin: Secondary | ICD-10-CM | POA: Diagnosis not present

## 2014-04-15 LAB — LIPID PANEL
CHOL/HDL RATIO: 3.7 ratio
Cholesterol: 73 mg/dL (ref 0–200)
HDL: 20 mg/dL — ABNORMAL LOW (ref >39)
LDL Cholesterol: 42 mg/dL (ref 0–99)
Triglycerides: 56 mg/dL (ref <150)
VLDL: 11 mg/dL (ref 0–40)

## 2014-04-15 LAB — CBC
HEMATOCRIT: 30.4 % — AB (ref 36.0–46.0)
Hemoglobin: 8.6 g/dL — ABNORMAL LOW (ref 12.0–15.0)
MCH: 22.2 pg — AB (ref 26.0–34.0)
MCHC: 28.3 g/dL — AB (ref 30.0–36.0)
MCV: 78.6 fL (ref 78.0–100.0)
PLATELETS: 245 10*3/uL (ref 150–400)
RBC: 3.87 MIL/uL (ref 3.87–5.11)
RDW: 22.9 % — AB (ref 11.5–15.5)
WBC: 5.9 10*3/uL (ref 4.0–10.5)

## 2014-04-15 LAB — PROTIME-INR
INR: 3.61 — AB (ref 0.00–1.49)
Prothrombin Time: 36 seconds — ABNORMAL HIGH (ref 11.6–15.2)

## 2014-04-15 LAB — GLUCOSE, CAPILLARY
GLUCOSE-CAPILLARY: 106 mg/dL — AB (ref 70–99)
Glucose-Capillary: 105 mg/dL — ABNORMAL HIGH (ref 70–99)
Glucose-Capillary: 102 mg/dL — ABNORMAL HIGH (ref 70–99)
Glucose-Capillary: 110 mg/dL — ABNORMAL HIGH (ref 70–99)
Glucose-Capillary: 111 mg/dL — ABNORMAL HIGH (ref 70–99)
Glucose-Capillary: 168 mg/dL — ABNORMAL HIGH (ref 70–99)

## 2014-04-15 LAB — CREATININE, SERUM
Creatinine, Ser: 0.96 mg/dL (ref 0.50–1.10)
GFR calc Af Amer: 76 mL/min — ABNORMAL LOW (ref >90)
GFR calc non Af Amer: 65 mL/min — ABNORMAL LOW (ref >90)

## 2014-04-15 LAB — PRO B NATRIURETIC PEPTIDE: Pro B Natriuretic peptide (BNP): 10371 pg/mL — ABNORMAL HIGH (ref 0–125)

## 2014-04-15 LAB — HEMOGLOBIN A1C
Hgb A1c MFr Bld: 8 % — ABNORMAL HIGH (ref <5.7)
MEAN PLASMA GLUCOSE: 183 mg/dL — AB (ref <117)

## 2014-04-15 MED ORDER — LISINOPRIL 2.5 MG PO TABS
2.5000 mg | ORAL_TABLET | Freq: Every day | ORAL | Status: DC
  Administered 2014-04-15: 2.5 mg via ORAL
  Filled 2014-04-15 (×3): qty 1

## 2014-04-15 MED ORDER — PANTOPRAZOLE SODIUM 40 MG PO TBEC
40.0000 mg | DELAYED_RELEASE_TABLET | Freq: Every day | ORAL | Status: DC
  Administered 2014-04-15: 40 mg via ORAL
  Filled 2014-04-15 (×2): qty 1

## 2014-04-15 MED ORDER — STROKE: EARLY STAGES OF RECOVERY BOOK
Freq: Once | Status: DC
  Filled 2014-04-15: qty 1

## 2014-04-15 MED ORDER — VITAMIN D (ERGOCALCIFEROL) 1.25 MG (50000 UNIT) PO CAPS: 50000.0000 [IU] | ORAL_CAPSULE | ORAL | Status: DC

## 2014-04-15 MED ORDER — QUETIAPINE FUMARATE ER 50 MG PO TB24
150.0000 mg | ORAL_TABLET | Freq: Every day | ORAL | Status: DC
  Administered 2014-04-15: 150 mg via ORAL
  Filled 2014-04-15 (×4): qty 3

## 2014-04-15 MED ORDER — RIVAROXABAN 15 MG PO TABS
15.0000 mg | ORAL_TABLET | Freq: Every day | ORAL | Status: DC
  Administered 2014-04-15: 15 mg via ORAL
  Filled 2014-04-15 (×3): qty 1

## 2014-04-15 MED ORDER — CARVEDILOL 3.125 MG PO TABS
3.1250 mg | ORAL_TABLET | Freq: Two times a day (BID) | ORAL | Status: DC
  Administered 2014-04-15 (×2): 3.125 mg via ORAL
  Filled 2014-04-15 (×5): qty 1

## 2014-04-15 MED ORDER — INSULIN ASPART 100 UNIT/ML ~~LOC~~ SOLN
0.0000 [IU] | SUBCUTANEOUS | Status: DC
  Administered 2014-04-15: 2 [IU] via SUBCUTANEOUS
  Administered 2014-04-16 – 2014-04-17 (×2): 1 [IU] via SUBCUTANEOUS

## 2014-04-15 MED ORDER — SENNOSIDES-DOCUSATE SODIUM 8.6-50 MG PO TABS
1.0000 | ORAL_TABLET | Freq: Every evening | ORAL | Status: DC | PRN
  Filled 2014-04-15: qty 1

## 2014-04-15 MED ORDER — METOLAZONE 2.5 MG PO TABS
2.5000 mg | ORAL_TABLET | Freq: Every day | ORAL | Status: DC
  Administered 2014-04-15: 2.5 mg via ORAL
  Filled 2014-04-15 (×3): qty 1

## 2014-04-15 MED ORDER — OXYCODONE-ACETAMINOPHEN 10-325 MG PO TABS: 1.0000 | ORAL_TABLET | Freq: Three times a day (TID) | ORAL | Status: DC | PRN

## 2014-04-15 MED ORDER — SODIUM CHLORIDE 0.9 % IV SOLN
2000.0000 mg | Freq: Two times a day (BID) | INTRAVENOUS | Status: DC
  Administered 2014-04-15: 2000 mg via INTRAVENOUS
  Filled 2014-04-15 (×2): qty 20

## 2014-04-15 MED ORDER — FUROSEMIDE 40 MG PO TABS
40.0000 mg | ORAL_TABLET | Freq: Two times a day (BID) | ORAL | Status: DC
  Administered 2014-04-15 – 2014-04-17 (×3): 40 mg via ORAL
  Filled 2014-04-15 (×7): qty 1

## 2014-04-15 MED ORDER — POLYETHYLENE GLYCOL 3350 17 G PO PACK
17.0000 g | PACK | Freq: Every day | ORAL | Status: DC | PRN
  Filled 2014-04-15: qty 1

## 2014-04-15 MED ORDER — ALPRAZOLAM 0.5 MG PO TABS
1.0000 mg | ORAL_TABLET | Freq: Every evening | ORAL | Status: DC | PRN
  Administered 2014-04-15 – 2014-04-16 (×2): 1 mg via ORAL
  Filled 2014-04-15 (×3): qty 2

## 2014-04-15 MED ORDER — OXYCODONE HCL 5 MG PO TABS
5.0000 mg | ORAL_TABLET | Freq: Three times a day (TID) | ORAL | Status: DC | PRN
  Administered 2014-04-15 – 2014-04-16 (×3): 5 mg via ORAL
  Filled 2014-04-15 (×5): qty 1

## 2014-04-15 MED ORDER — ALBUTEROL SULFATE (2.5 MG/3ML) 0.083% IN NEBU: 2.5000 mg | INHALATION_SOLUTION | Freq: Four times a day (QID) | RESPIRATORY_TRACT | Status: DC | PRN

## 2014-04-15 MED ORDER — POLYSACCHARIDE IRON COMPLEX 150 MG PO CAPS
150.0000 mg | ORAL_CAPSULE | Freq: Every day | ORAL | Status: DC
  Administered 2014-04-15: 150 mg via ORAL
  Filled 2014-04-15 (×3): qty 1

## 2014-04-15 MED ORDER — AMIODARONE HCL 200 MG PO TABS
200.0000 mg | ORAL_TABLET | Freq: Every day | ORAL | Status: DC
  Administered 2014-04-15: 200 mg via ORAL
  Filled 2014-04-15 (×2): qty 1

## 2014-04-15 MED ORDER — LEVETIRACETAM 750 MG PO TABS
2000.0000 mg | ORAL_TABLET | Freq: Two times a day (BID) | ORAL | Status: DC
  Filled 2014-04-15: qty 1

## 2014-04-15 MED ORDER — OXYCODONE-ACETAMINOPHEN 5-325 MG PO TABS
1.0000 | ORAL_TABLET | Freq: Three times a day (TID) | ORAL | Status: DC | PRN
  Administered 2014-04-15 – 2014-04-17 (×3): 1 via ORAL
  Filled 2014-04-15 (×7): qty 1

## 2014-04-15 MED ORDER — MORPHINE SULFATE 2 MG/ML IJ SOLN
1.0000 mg | INTRAMUSCULAR | Status: DC | PRN
  Administered 2014-04-15 – 2014-04-16 (×10): 1 mg via INTRAVENOUS
  Filled 2014-04-15 (×11): qty 1

## 2014-04-15 MED ORDER — LEVETIRACETAM 750 MG PO TABS
2000.0000 mg | ORAL_TABLET | Freq: Two times a day (BID) | ORAL | Status: DC
  Administered 2014-04-15: 2000 mg via ORAL
  Administered 2014-04-17: 500 mg via ORAL
  Filled 2014-04-15 (×6): qty 1

## 2014-04-15 MED ORDER — ESCITALOPRAM OXALATE 20 MG PO TABS
20.0000 mg | ORAL_TABLET | Freq: Every day | ORAL | Status: DC
  Administered 2014-04-15: 20 mg via ORAL
  Filled 2014-04-15 (×2): qty 1

## 2014-04-15 MED ORDER — POTASSIUM CHLORIDE 20 MEQ/15ML (10%) PO LIQD
20.0000 meq | Freq: Every day | ORAL | Status: DC
  Filled 2014-04-15 (×4): qty 15

## 2014-04-15 MED ORDER — LEVOTHYROXINE SODIUM 50 MCG PO TABS
50.0000 ug | ORAL_TABLET | Freq: Every day | ORAL | Status: DC
  Administered 2014-04-15: 50 ug via ORAL
  Filled 2014-04-15 (×4): qty 1

## 2014-04-15 MED ORDER — ENOXAPARIN SODIUM 30 MG/0.3ML ~~LOC~~ SOLN: 30.0000 mg | SUBCUTANEOUS | Status: DC

## 2014-04-15 MED ORDER — ACETAMINOPHEN 325 MG PO TABS: 650.0000 mg | ORAL_TABLET | Freq: Four times a day (QID) | ORAL | Status: DC | PRN

## 2014-04-15 MED ORDER — DIGOXIN 125 MCG PO TABS
0.1250 mg | ORAL_TABLET | Freq: Every day | ORAL | Status: DC
  Administered 2014-04-15: 0.125 mg via ORAL
  Filled 2014-04-15 (×3): qty 1

## 2014-04-15 MED ORDER — CEFTRIAXONE SODIUM 1 G IJ SOLR
1.0000 g | INTRAMUSCULAR | Status: DC
  Administered 2014-04-15 – 2014-04-16 (×2): 1 g via INTRAVENOUS
  Filled 2014-04-15 (×3): qty 10

## 2014-04-15 NOTE — Evaluation (Signed)
Clinical/Bedside Swallow Evaluation Patient Details  Name: Elizabeth Love MRN: [id number] Date of Birth: [birth year]  Today's Date: 04/15/2014 Time: 0920-0945 SLP Time Calculation (min): 25 min  Past Medical History:  Past Medical History  Diagnosis Date  . Seizures   . Hypertension   . Stroke     2007 - R sided weakness and expressive aphasia with h/o behavioral problems related to this  . Asthma   . Diabetes mellitus   . Chronic systolic CHF (congestive heart failure)   . Atrial fibrillation     a. On Xarelto after having DVT (prev felt to be poor anticoag cand 2/2 noncompliance).  . History of DVT (deep vein thrombosis)     a. 06/2012: RLE DVT, + protein C deficiency, placed on Xarelto. s/p IVC filter  . Noncompliance     Due to mental status, family has had to coax her to take medicines  . Hypothyroidism   . VT (ventricular tachycardia)     a. h/o ICD ~2007-2008. b. Adm 08/2012 with UTI, had VT/ICD shock x 5 (hypokalemic);  c. 06/2013 s/p ICD Gen change: SJM DC ICD ser #: [id number]  . Protein C deficiency   . NICM (nonischemic cardiomyopathy)     a. s/p AICD ~2008 (St. Jude) - previous care [facility]. No known hx CAD. b. EF 10% by echo 04/2012;  c. 06/2013 s/p ICD Gen change: SJM DC ICD ser #: [id number]  . 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 Panic attacks   . Anxiety    Past Surgical History:  Past Surgical History  Procedure Laterality Date  . Cholecystectomy    . Appendectomy    . Pacemaker insertion      [employer] [facility]  . Vena cava filter placement  07/19/2012    Procedure: INSERTION VENA-CAVA FILTER;  Surgeon: Larina Earthly, MD;  [facility];  Service: Vascular;  Laterality: [name];  . Insert / replace / remove pacemaker  2014    ICD. St. Jude; inserted 2008 [facility]); gen change 2014 SK  . Abdominal hysterectomy     HPI:  Pt is 58 y.o. with hx of CVA in 2007 with residual R side weakness and aphasia/ behavioral disturbance, medication noncompliance, systolic  CHF w/ recent EF 15% and diffuse hypokinesis s/p pacemaker, seizure disorder, afib on xarelto, presenting with CVA, R fibular fracture, UTI. Admitted 7/4-7/7 with acute on chronic systolic heart failure, abd pain w/ anemia. Fell off bed 7/26, rolling ankle. CXR showed severe cardiomegaly w/ pulm vascular congestion. Head CT showed changes over R temporal lobe concerning for CVA. Also presenting with UTI. Pt initially passed stroke swallow screen but then made NPO d/t concerns with current cognitive status affecting swallow function; SLP order to further evaluate swallow function.   Assessment / Plan / Recommendation Clinical Impression  Pt demonstrated no overt s/s of aspiration at bedside; however, this evaluation was somewhat limited d/t pt being in extreme pain/ agitation- constant movement/ yelling and could not get comfortable. Consumed about 6 oz of thin liquid via straw with no overt difficulties- swallow appeared timely with adequate hyolaryngeal elevation. Refused puree but no difficulties with solid. Given these findings pt risk of aspiration is low and recommend regular/ thin liquid diet. However given current cognitive status (? baseline) full supervision is necessary to provide cueing for pt to take small bites/ sips and sit as upright as tolerated. Also recommend meds whole in liquid unless difficulties arise- it has been noted  that pt has a hx of refusal of meds and may want to try crushed in puree if refusing whole. ST will continue to follow x1 to ensure diet tolerance given limited nature of evaluation today.    Aspiration Risk  Mild    Diet Recommendation Regular;Thin liquid   Liquid Administration via: Cup;Straw Medication Administration: Whole meds with liquid (try crushed in puree or alt means if refusal) Supervision: Patient able to self feed;Full supervision/cueing for compensatory strategies Compensations: Slow rate;Small sips/bites Postural Changes and/or Swallow Maneuvers:  Seated upright 90 degrees    Other  Recommendations Oral Care Recommendations: Oral care BID   Follow Up Recommendations  24 hour supervision/assistance    Frequency and Duration min 1 x/week  1 week   Pertinent Vitals/Pain n/a    SLP Swallow Goals     Swallow Study Prior Functional Status  Type of Home: House Available Help at Discharge: Family;Available 24 hours/day    General HPI: Pt is 58 y.o. 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 Admitted 7/4-7/7 with acute on chronic systolic heart failure, abd pain w/ anemia. Fell off bed 7/26, rolling ankle. CXR showed severe cardiomegaly w/ pulm vascular congestion. Head CT showed changes over R temporal lobe concerning for CVA. Also presenting with UTI. Pt initially passed stroke swallow screen but then made NPO d/t concerns with current cognitive status affecting swallow function; SLP order to further evaluate swallow function. Type of Study: Bedside swallow evaluation Diet Prior to this Study: NPO Temperature Spikes Noted: No Respiratory Status: Room air Behavior/Cognition: Agitated;Impulsive;Requires cueing Oral Cavity - Dentition: Adequate natural dentition Self-Feeding Abilities: Able to feed self;Needs assist Patient Positioning: Upright in bed Baseline Vocal Quality: Clear Volitional Cough: Cognitively unable to elicit Volitional Swallow: Unable to elicit    Oral/Motor/Sensory Function Overall Oral Motor/Sensory Function: Other (comment) (unable to fully assess- in pain/ not following directions)   Ice Chips Ice chips: Not tested   Thin Liquid Thin Liquid: Within functional limits Presentation: Straw    Nectar Thick Nectar Thick Liquid: Not tested   Honey Thick Honey Thick Liquid: Not tested   Puree  Puree: Not tested   Solid   GO    Solid: Within functional limits Presentation: Self Fed       Andrian Urbach K, MA, CCC-SLP 04/15/2014,9:59 AM

## 2014-04-15 NOTE — Progress Notes (Signed)
Pt failed swallow eval, NP on call paged via amion for new orders regarding PO medicines. Will continue to monitor. Huel Coventry, RN

## 2014-04-15 NOTE — Consult Note (Signed)
Orthopaedic Trauma Service (OTS)  Reason for Consult: R bimalleolar ankle fracture  Referring Physician: Hampton Abbot, MD (EDP)   HPI: Elizabeth Love is an 58 y.o. black female with history of stroke with right-sided dysfunction as well as other medical comorbidities who sustained a ground-level fall several days ago. Patient had persistent swelling and pain and was encouraged to go to the emergency department. [place] [facility] where she was found to have a right bimalleolar ankle fracture. Given her medical comorbidities she was transferred to [facility] and admitted to the medicine service with orthopedics consulting.  Patient was seen in 3 E. 22. She complains of severe right ankle pain. She is crying in bed. She does not say much but he is awake and able to respond with yes or no. She was actually able to introduce herself as Elizabeth Love as well.  I did contact the patient's brother whom she lives with. They have a rental house with all necessary equipment for the patient. They are quite comfortable with her discharging to home once she is stable to do so. Brother states that she does use a 4 prong cane for ambulation. He also makes note that her right foot drags across the floor which made contributed to her falling. It sounds as if she has a footdrop on her right side related to her stroke. She will need a AFO wants her fracture is healed to limit this impediment to ambulation.   Past Medical History  Diagnosis Date  . Seizures   . Hypertension   . Stroke     2007 - R sided weakness and expressive aphasia with h/o behavioral problems related to this  . Asthma   . Diabetes mellitus   . Chronic systolic CHF (congestive heart failure)   . Atrial fibrillation     a. On Xarelto after having DVT (prev felt to be poor anticoag cand 2/2 noncompliance).  . History of DVT (deep vein thrombosis)     a. 06/2012: RLE DVT, + protein C deficiency, placed on Xarelto.  s/p IVC filter  . Noncompliance     Due to mental status, family has had to coax her to take medicines  . Hypothyroidism   . VT (ventricular tachycardia)     a. h/o ICD ~2007-2008. b. Adm 08/2012 with UTI, had VT/ICD shock x 5 (hypokalemic);  c. 06/2013 s/p ICD Gen change: SJM DC ICD ser #: [id number]  . Protein C deficiency   . NICM (nonischemic cardiomyopathy)     a. s/p AICD ~2008 (St. Jude) - previous care [facility]. No known hx CAD. b. EF 10% by echo 04/2012;  c. 06/2013 s/p ICD Gen change: SJM DC ICD ser #: [id number]  . 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 Panic attacks   . Anxiety     Past Surgical History  Procedure Laterality Date  . Cholecystectomy    . Appendectomy    . Pacemaker insertion      [place]  . Vena cava filter placement  07/19/2012    Procedure: INSERTION VENA-CAVA FILTER;  Surgeon: Rosetta Posner, MD;  Location: [facility];  Service: Vascular;  Laterality: [name];  . Insert / replace / remove pacemaker  2014    ICD. St. Jude; inserted 2008 [facility]); gen change 2014 SK  . Abdominal hysterectomy      Family History  Problem Relation Age of Onset  . Hypertension  Mother   . Coronary artery disease Neg Hx     Social History:  reports that she has quit smoking. She has never used smokeless tobacco. She reports that she does not drink alcohol or use illicit drugs.  Allergies:  Allergies  Allergen Reactions  . Penicillins Rash  . Sulfa Antibiotics Rash    Medications: I have reviewed the patient's current medications.  Results for orders placed during the hospital encounter of 04/14/14 (from the past 48 hour(s))  CBG MONITORING, ED     Status: Abnormal   Collection Time    04/14/14  4:40 PM      Result Value Ref Range   Glucose-Capillary 104 (*) 70 - 99 mg/dL  CBC WITH DIFFERENTIAL     Status: Abnormal   Collection Time    04/14/14  5:32 PM      Result Value Ref Range   WBC 5.7  4.0 - 10.5 K/uL   RBC 4.09  3.87 - 5.11 MIL/uL   Hemoglobin 9.2 (*)  12.0 - 15.0 g/dL   HCT 32.3 (*) 36.0 - 46.0 %   MCV 79.0  78.0 - 100.0 fL   MCH 22.5 (*) 26.0 - 34.0 pg   MCHC 28.5 (*) 30.0 - 36.0 g/dL   RDW 24.3 (*) 11.5 - 15.5 %   Platelets 250  150 - 400 K/uL   Neutrophils Relative % 68  43 - 77 %   Neutro Abs 3.9  1.7 - 7.7 K/uL   Lymphocytes Relative 18  12 - 46 %   Lymphs Abs 1.1  0.7 - 4.0 K/uL   Monocytes Relative 12  3 - 12 %   Monocytes Absolute 0.7  0.1 - 1.0 K/uL   Eosinophils Relative 2  0 - 5 %   Eosinophils Absolute 0.1  0.0 - 0.7 K/uL   Basophils Relative 1  0 - 1 %   Basophils Absolute 0.0  0.0 - 0.1 K/uL  COMPREHENSIVE METABOLIC PANEL     Status: Abnormal   Collection Time    04/14/14  5:32 PM      Result Value Ref Range   Sodium 145  137 - 147 mEq/L   Potassium 3.6 (*) 3.7 - 5.3 mEq/L   Chloride 102  96 - 112 mEq/L   CO2 29  19 - 32 mEq/L   Glucose, Bld 114 (*) 70 - 99 mg/dL   BUN 15  6 - 23 mg/dL   Creatinine, Ser 1.20 (*) 0.50 - 1.10 mg/dL   Calcium 9.7  8.4 - 10.5 mg/dL   Total Protein 8.3  6.0 - 8.3 g/dL   Albumin 3.1 (*) 3.5 - 5.2 g/dL   AST 37  0 - 37 U/L   ALT 17  0 - 35 U/L   Alkaline Phosphatase 65  39 - 117 U/L   Total Bilirubin 2.0 (*) 0.3 - 1.2 mg/dL   GFR calc non Af Amer 50 (*) >90 mL/min   GFR calc Af Amer 58 (*) >90 mL/min   Comment: (NOTE)     The eGFR has been calculated using the CKD EPI equation.     This calculation has not been validated in all clinical situations.     eGFR's persistently <90 mL/min signify possible Chronic Kidney     Disease.   Anion gap 14  5 - 15  PROTIME-INR     Status: Abnormal   Collection Time    04/14/14  5:32 PM      Result  Value Ref Range   Prothrombin Time 42.9 (*) 11.6 - 15.2 seconds   INR 4.52 (*) 0.00 - 1.49  URINALYSIS, ROUTINE W REFLEX MICROSCOPIC     Status: Abnormal   Collection Time    04/14/14  6:11 PM      Result Value Ref Range   Color, Urine YELLOW  YELLOW   APPearance CLEAR  CLEAR   Specific Gravity, Urine 1.009  1.005 - 1.030   pH 6.5  5.0 -  8.0   Glucose, UA NEGATIVE  NEGATIVE mg/dL   Hgb urine dipstick MODERATE (*) NEGATIVE   Bilirubin Urine NEGATIVE  NEGATIVE   Ketones, ur NEGATIVE  NEGATIVE mg/dL   Protein, ur NEGATIVE  NEGATIVE mg/dL   Urobilinogen, UA 4.0 (*) 0.0 - 1.0 mg/dL   Nitrite NEGATIVE  NEGATIVE   Leukocytes, UA MODERATE (*) NEGATIVE  URINE MICROSCOPIC-ADD ON     Status: Abnormal   Collection Time    04/14/14  6:11 PM      Result Value Ref Range   Squamous Epithelial / LPF RARE  RARE   WBC, UA 3-6  <3 WBC/hpf   RBC / HPF 3-6  <3 RBC/hpf   Bacteria, UA MANY (*) RARE  CBG MONITORING, ED     Status: Abnormal   Collection Time    04/14/14 10:34 PM      Result Value Ref Range   Glucose-Capillary 122 (*) 70 - 99 mg/dL  GLUCOSE, CAPILLARY     Status: Abnormal   Collection Time    04/15/14 12:44 AM      Result Value Ref Range   Glucose-Capillary 106 (*) 70 - 99 mg/dL  LIPID PANEL     Status: Abnormal   Collection Time    04/15/14  1:35 AM      Result Value Ref Range   Cholesterol 73  0 - 200 mg/dL   Triglycerides 56  <150 mg/dL   HDL 20 (*) >39 mg/dL   Total CHOL/HDL Ratio 3.7     VLDL 11  0 - 40 mg/dL   LDL Cholesterol 42  0 - 99 mg/dL   Comment:            Total Cholesterol/HDL:CHD Risk     Coronary Heart Disease Risk Table                         Men   Women      1/2 Average Risk   3.4   3.3      Average Risk       5.0   4.4      2 X Average Risk   9.6   7.1      3 X Average Risk  23.4   11.0                Use the calculated Patient Ratio     above and the CHD Risk Table     to determine the patient's CHD Risk.                ATP III CLASSIFICATION (LDL):      <100     mg/dL   Optimal      100-129  mg/dL   Near or Above                        Optimal      130-159  mg/dL   Borderline  160-189  mg/dL   High      >190     mg/dL   Very High  CBC     Status: Abnormal   Collection Time    04/15/14  1:35 AM      Result Value Ref Range   WBC 5.9  4.0 - 10.5 K/uL   RBC 3.87  3.87 - 5.11  MIL/uL   Hemoglobin 8.6 (*) 12.0 - 15.0 g/dL   HCT 30.4 (*) 36.0 - 46.0 %   MCV 78.6  78.0 - 100.0 fL   MCH 22.2 (*) 26.0 - 34.0 pg   MCHC 28.3 (*) 30.0 - 36.0 g/dL   RDW 22.9 (*) 11.5 - 15.5 %   Platelets 245  150 - 400 K/uL  CREATININE, SERUM     Status: Abnormal   Collection Time    04/15/14  1:35 AM      Result Value Ref Range   Creatinine, Ser 0.96  0.50 - 1.10 mg/dL   GFR calc non Af Amer 65 (*) >90 mL/min   GFR calc Af Amer 76 (*) >90 mL/min   Comment: (NOTE)     The eGFR has been calculated using the CKD EPI equation.     This calculation has not been validated in all clinical situations.     eGFR's persistently <90 mL/min signify possible Chronic Kidney     Disease.  PRO B NATRIURETIC PEPTIDE     Status: Abnormal   Collection Time    04/15/14  1:35 AM      Result Value Ref Range   Pro B Natriuretic peptide (BNP) 10371.0 (*) 0 - 125 pg/mL  PROTIME-INR     Status: Abnormal   Collection Time    04/15/14  1:35 AM      Result Value Ref Range   Prothrombin Time 36.0 (*) 11.6 - 15.2 seconds   INR 3.61 (*) 0.00 - 1.49  GLUCOSE, CAPILLARY     Status: Abnormal   Collection Time    04/15/14  3:33 AM      Result Value Ref Range   Glucose-Capillary 105 (*) 70 - 99 mg/dL  GLUCOSE, CAPILLARY     Status: Abnormal   Collection Time    04/15/14  7:26 AM      Result Value Ref Range   Glucose-Capillary 102 (*) 70 - 99 mg/dL    Dg Chest 1 View  04/14/2014   CLINICAL DATA:  Fall  EXAM: CHEST - 1 VIEW  COMPARISON:  Prior radiograph from 03/21/2014  FINDINGS: Left-sided pacemaker/AICD is unchanged. Severe cardiomegaly grossly stable. Mediastinal silhouette within normal limits.  The lungs are normally inflated. Mild diffuse pulmonary vascular congestion without frank pulmonary edema. No definite pleural effusion. No focal infiltrate. No pneumothorax.  No acute osseous abnormality identified.  IMPRESSION: Severe cardiomegaly with mild diffuse pulmonary vascular congestion without overt  pulmonary edema. No other acute cardiopulmonary abnormality.   Electronically Signed   By: Jeannine Boga M.D.   On: 04/14/2014 19:29   Dg Hip Complete Right  04/14/2014   CLINICAL DATA:  Diffuse right lower extremity pain secondary to a fall from the bed 3 days ago.  EXAM: RIGHT HIP - COMPLETE 2+ VIEW  COMPARISON:  None.  FINDINGS: There is no evidence of hip fracture or dislocation. There is no evidence of arthropathy or other focal bone abnormality.  IMPRESSION: No osseous abnormality.   Electronically Signed   By: Rozetta Nunnery M.D.   On: 04/14/2014 19:38  Dg Knee 2 Views Right  04/14/2014   CLINICAL DATA:  Fall from bed.  Diffuse right lower extremity pain.  EXAM: RIGHT KNEE - 1-2 VIEW  COMPARISON:  07/18/2012  FINDINGS: Diffuse bony demineralization. Poor definition of fat plane surround knee make today's exam indeterminate for knee effusion. Suspected diffuse subcutaneous edema. No cortical discontinuity identified to suggest fracture.  IMPRESSION: 1. Diffuse bony demineralization. 2. Suspected subcutaneous edema. Indeterminate exam for knee effusion.   Electronically Signed   By: Sherryl Barters M.D.   On: 04/14/2014 19:36   Dg Tibia/fibula Right  04/14/2014   CLINICAL DATA:  Fall.  EXAM: RIGHT TIBIA AND FIBULA - 2 VIEW  COMPARISON:  None.  FINDINGS: Slightly angulated fracture of the distal right fibular shaft is present. Tibia is intact.  IMPRESSION: Slightly angulated fracture of the distal right fibular shaft.   Electronically Signed   By: Marcello Moores  Register   On: 04/14/2014 19:36   Dg Ankle 2 Views Right  04/14/2014   CLINICAL DATA:  Fall  EXAM: RIGHT ANKLE - 2 VIEW  COMPARISON:  None.  FINDINGS: There is an acute transverse fracture through the distal right fibular shaft. Ankle mortise is grossly approximated. No other definite fracture.  Mild diffuse soft tissue swelling present left ankle.  Diffuse osteopenia present.  IMPRESSION: Acute nondisplaced transverse fracture of the distal  right fibular shaft.   Electronically Signed   By: Jeannine Boga M.D.   On: 04/14/2014 19:35   Ct Head Wo Contrast  04/15/2014   CLINICAL DATA:  Question right temporal stroke  EXAM: CT HEAD WITHOUT CONTRAST  TECHNIQUE: Contiguous axial images were obtained from the base of the skull through the vertex without intravenous contrast.  COMPARISON:  CT 04/14/2014  FINDINGS: Large territory chronic infarct in the left middle cerebral artery territory involving the frontal temporal and parietal lobes as well as the left basal ganglia.  Negative for acute infarct. Right temporal lobe area appears normal on the current study. Artifact is present on the prior study. Negative for hemorrhage or mass.  IMPRESSION: Large territory chronic left MCA infarct.  No acute abnormality.   Electronically Signed   By: Franchot Gallo M.D.   On: 04/15/2014 09:28   Ct Head Wo Contrast  04/14/2014   CLINICAL DATA:  Fall. Head injury. Prior stroke, nonverbal patient.  EXAM: CT HEAD WITHOUT CONTRAST  TECHNIQUE: Contiguous axial images were obtained from the base of the skull through the vertex without intravenous contrast.  COMPARISON:  Multiple exams, including 03/21/2014  FINDINGS: I am unable to locate the report from the 03/21/2014 exam. 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  Large prior left MCA distribution stroke noted with associated wall Fritz Pickerel and degeneration causing a small left cerebral peduncle. Stroke extends up into the white matter the vertex, with some medial involvement of the left frontal lobe as shown on image 25 of series 2, stable.  The large region of involvement appears similar to the prior exam.  Reduced gray-white differentiation inferiorly in the right temporal lobe. This is less striking on series 4 is compared to series 2 and may partially be due to motion artifact and streak artifact, but appears asymmetric enough to potentially represent a early stroke.  No intracranial hemorrhage or mass  lesion identified.  IMPRESSION: 1. Hypodensity and poor gray-white differentiation inferiorly in the right temporal lobe such that an acute CVA involving the right temporal lobe cannot be readily excluded. This is in area which can be subject to  artifact simulating a stroke. Consider MRI for further characterization. 2. Old large left MCA distribution stroke.  These results were called by telephone at the time of interpretation on 04/14/2014 at 7:31 pm to Dr. Shea Evans, Aline Brochure , who verbally acknowledged these results.   Electronically Signed   By: Sherryl Barters M.D.   On: 04/14/2014 19:34   Dg Foot 2 Views Right  04/14/2014   CLINICAL DATA:  Fall.  EXAM: RIGHT FOOT - 2 VIEW  COMPARISON:  None.  FINDINGS: Diffuse osteopenia and degenerative change. No acute bony or joint abnormality identified.  IMPRESSION: No acute bony or joint abnormality. Diffuse osteopenia and degenerative change.   Electronically Signed   By: Marcello Moores  Register   On: 04/14/2014 19:34    Review of Systems  Respiratory: Negative for shortness of breath.   Cardiovascular: Negative for chest pain and palpitations.  Gastrointestinal: Negative for nausea and vomiting.  Musculoskeletal:       Right ankle pain  Neurological:       Patient unable to right leg Reports no sensation right foot   Review of systems somewhat limited but patient able to shake her head yes or no in response to questions  Blood pressure 103/64, pulse 79, temperature 97.9 F (36.6 C), temperature source Oral, resp. rate 18, height 5' (1.524 m), weight 52.799 kg (116 lb 6.4 oz), SpO2 100.00%. Physical Exam  Constitutional:  A pleasant 58 year old black female, awake and cooperative  Cardiovascular: Normal rate, regular rhythm, S1 normal and S2 normal.   Respiratory: Effort normal and breath sounds normal.  GI: Soft. Normal appearance.  + BS, NT  Musculoskeletal:  Right lower extremity Inspection:   Short-leg stirrup splint has been applied, it is  fitting well   No knee effusion present   Hip is unremarkable Bony eval:   Tender right ankle medially and laterally   I did not remove the splint completely as it is stabilized   Knee is nontender   Hip is nontender. No pain with axial loading or logrolling Soft tissue:   Did not evaluate all soft tissue of the ankle as splint is fitting well   Knee is unremarkable. A stable with clinical exam. No significant swelling noted ROM:   Ankle range of motion not performed    Sensation:   No DPN, SPN, TN function noted. This may be limited by comprehension the patient Motor:   No EHL, FHL, lesser toe motor function observed Vascular:   Extremity is warm   Unable to palpate DP pulse     Assessment/Plan:  58 year old female with numerous medical comorbidities status post ground level fall with right bimalleolar ankle fracture  1. Fall  2. Right bimalleolar ankle fracture- SER-4 type pattern  Acceptable overall alignment is noted  Can managed nonoperatively for now however it is imperative that the patient be nonweightbearing for the next 8 weeks   After talking to patient's brother I am somewhat concerned about patient's compliance level. She does sound to be a little stubborn at times and may weight-bear on her right leg  Will obtain another set of x-rays at this time complete ankle series  May need to place patient in a long-leg cast to prevent weightbearing in which case I would strongly recommend bed to chair transfers only.   I would still strongly urged bed to chair transfers only to minimize chances of weightbearing on her right ankle  Continue with aggressive ice and elevation  Pain control  Therapies  Patient's brother indicates that they are comfortable taking her home and that the house they live in outfit was necessary equipment. Do think that the patient would benefit from a few more days of therapy in the acute setting to determine her overall function and may need to  consider skilled nursing or inpatient rehabilitation  3. continue per medicine service  Would not consider discharge until later tomorrow or Friday    Jari Pigg, PA-C [facility] [phone] (P) 04/15/2014, 10:28 AM

## 2014-04-15 NOTE — Progress Notes (Signed)
[facility] PROGRESS NOTE  JERRIAH TROYAN QZE:[id number] DOB: 05-07-1956 DOA: 04/14/2014 PCP: Lemont Fillers., NP  Assessment/Plan: 1. Right displaced fracture of distal fibula -Patient seen and evaluated by orthopedic surgery, recommending nonoperative management for now. -Or so also recommending not weightbearing for the next 8 weeks, although there is concern for this given patient's history of noncompliance. -Physical therapy recommending SNF placement, family however feeling comfortable taking her home. -Pain is still not under control -Will reassess in a.m. and discuss options with family members.   2. History of CVA -Patient appears be functioning at her baseline. -Initial CT scan of the brain showed hypodensity which could reflect acute CVA, neurology consulted, repeat CT scan of brain performed on 04/15/2014 showing no acute intracranial abnormalities and again demonstrating old CVA infarct. -Patient remains neurologically stable, findings on initial CT scan likely artifactual.   3. Urinary tract infection. -Continue empiric IV antibiotic therapy with ceftriaxone  4. Atrial fibrillation -Patient remains on chronic anticoagulation with Xarelto at 15 mg by mouth daily. -Pharmacy consulted for dosing -Rate controlled, continue digoxin and carvedilol  Code Status: Full Code Family Communication:  Disposition Plan: PT recommending SNF placement    Consultants:  Neurology  Orthopedic surgery  Antibiotics:  Ceftriaxone  HPI/Subjective: Patient is a 58 year old female with a past medical history of CVA with residual right-sided hemiparesis, aphasia, admitted to the medicine service on 04/15/2014, after falling of the edge of bed several days prior to presentation. She initially went to [facility] where imaging studies revealed a nondisplaced distal fibular shaft fracture. A head CT was done on admission which revealed hypodensity and poor gray white differentiation in  fairly in the right temporal lobe that raised a question of acute CVA. Neurology was consulted as it was felt this likely represented an artifact. A repeat CT scan of the brain performed on 04/15/2014 showed a large territory chronic left MCA infarct, without acute changes.  Objective: Filed Vitals:   04/15/14 1700  BP: 110/94  Pulse: 70  Temp: 97.6 F (36.4 C)  Resp: 18    Intake/Output Summary (Last 24 hours) at 04/15/14 1807 Last data filed at 04/15/14 1500  Gross per 24 hour  Intake    120 ml  Output      0 ml  Net    120 ml   Filed Weights   04/14/14 1641 04/14/14 2341 04/15/14 0421  Weight: 63.504 kg (140 lb) 52.663 kg (116 lb 1.6 oz) 52.799 kg (116 lb 6.4 oz)    Exam:   General:  Poor historian, patient complaining of pain to the ankle.  Cardiovascular: Regular rate rhythm normal S1-S2  Respiratory: Clear to auscultation bilaterally  Abdomen: Soft nontender not  Musculoskeletal: Right ankle wrapped, patient reporting pain with movement 2 right extremity  Data Reviewed: Basic Metabolic Panel:  Recent Labs Lab 04/14/14 1732 04/15/14 0135  NA 145  --   K 3.6*  --   CL 102  --   CO2 29  --   GLUCOSE 114*  --   BUN 15  --   CREATININE 1.20* 0.96  CALCIUM 9.7  --    Liver Function Tests:  Recent Labs Lab 04/14/14 1732  AST 37  ALT 17  ALKPHOS 65  BILITOT 2.0*  PROT 8.3  ALBUMIN 3.1*   No results found for this basename: LIPASE, AMYLASE,  in the last 168 hours No results found for this basename: AMMONIA,  in the last 168 hours CBC:  Recent Labs Lab 04/14/14 1732  04/15/14 0135  WBC 5.7 5.9  NEUTROABS 3.9  --   HGB 9.2* 8.6*  HCT 32.3* 30.4*  MCV 79.0 78.6  PLT 250 245   Cardiac Enzymes: No results found for this basename: CKTOTAL, CKMB, CKMBINDEX, TROPONINI,  in the last 168 hours BNP (last 3 results)  Recent Labs  01/10/14 0912 03/21/14 0633 04/15/14 0135  PROBNP 7500.0* 11633.0* 10371.0*   CBG:  Recent Labs Lab  04/15/14 0044 04/15/14 0333 04/15/14 0726 04/15/14 1205 04/15/14 1620  GLUCAP 106* 105* 102* 110* 111*    No results found for this or any previous visit (from the past 240 hour(s)).   Studies: Dg Chest 1 View  04/14/2014   CLINICAL DATA:  Fall  EXAM: CHEST - 1 VIEW  COMPARISON:  Prior radiograph from 03/21/2014  FINDINGS: Left-sided pacemaker/AICD is unchanged. Severe cardiomegaly grossly stable. Mediastinal silhouette within normal limits.  The lungs are normally inflated. Mild diffuse pulmonary vascular congestion without frank pulmonary edema. No definite pleural effusion. No focal infiltrate. No pneumothorax.  No acute osseous abnormality identified.  IMPRESSION: Severe cardiomegaly with mild diffuse pulmonary vascular congestion without overt pulmonary edema. No other acute cardiopulmonary abnormality.   Electronically Signed   By: Rise Mu M.D.   On: 04/14/2014 19:29   Dg Chest 2 View  04/15/2014   CLINICAL DATA:  Stroke, weakness, fall  EXAM: CHEST  2 VIEW  COMPARISON:  04/14/2014  FINDINGS: Cardiomegaly again noted. Central vascular congestion without convincing pulmonary edema. Dual lead cardiac pacemaker is unchanged in position. There is bilateral basilar atelectasis or scarring.  IMPRESSION: Cardiomegaly. Central vascular congestion without convincing pulmonary edema. Bilateral basilar atelectasis or scarring.   Electronically Signed   By: Natasha Mead M.D.   On: 04/15/2014 12:52   Dg Hip Complete Right  04/14/2014   CLINICAL DATA:  Diffuse right lower extremity pain secondary to a fall from the bed 3 days ago.  EXAM: RIGHT HIP - COMPLETE 2+ VIEW  COMPARISON:  None.  FINDINGS: There is no evidence of hip fracture or dislocation. There is no evidence of arthropathy or other focal bone abnormality.  IMPRESSION: No osseous abnormality.   Electronically Signed   By: Geanie Cooley M.D.   On: 04/14/2014 19:38   Dg Knee 2 Views Right  04/14/2014   CLINICAL DATA:  Fall from bed.   Diffuse right lower extremity pain.  EXAM: RIGHT KNEE - 1-2 VIEW  COMPARISON:  07/18/2012  FINDINGS: Diffuse bony demineralization. Poor definition of fat plane surround knee make today's exam indeterminate for knee effusion. Suspected diffuse subcutaneous edema. No cortical discontinuity identified to suggest fracture.  IMPRESSION: 1. Diffuse bony demineralization. 2. Suspected subcutaneous edema. Indeterminate exam for knee effusion.   Electronically Signed   By: Herbie Baltimore M.D.   On: 04/14/2014 19:36   Dg Tibia/fibula Right  04/14/2014   CLINICAL DATA:  Fall.  EXAM: RIGHT TIBIA AND FIBULA - 2 VIEW  COMPARISON:  None.  FINDINGS: Slightly angulated fracture of the distal right fibular shaft is present. Tibia is intact.  IMPRESSION: Slightly angulated fracture of the distal right fibular shaft.   Electronically Signed   By: Maisie Fus  Register   On: 04/14/2014 19:36   Dg Ankle 2 Views Right  04/14/2014   CLINICAL DATA:  Fall  EXAM: RIGHT ANKLE - 2 VIEW  COMPARISON:  None.  FINDINGS: There is an acute transverse fracture through the distal right fibular shaft. Ankle mortise is grossly approximated. No other definite fracture.  Mild diffuse  soft tissue swelling present left ankle.  Diffuse osteopenia present.  IMPRESSION: Acute nondisplaced transverse fracture of the distal right fibular shaft.   Electronically Signed   By: Rise Mu M.D.   On: 04/14/2014 19:35   Dg Ankle Complete Right  04/15/2014   CLINICAL DATA:  Followup fracture  EXAM: RIGHT ANKLE - COMPLETE 3+ VIEW  COMPARISON:  04/14/2014  FINDINGS: Study is limited by osteopenia. There is casting material artifact. Again noted oblique minimal displaced fracture in distal fibula. Question mild disruption of ankle mortise with mild widening of medial tibiotalar space.  IMPRESSION: Again noted to oblique minimal displaced fracture of distal fibula. Question mild disruption of ankle mortise with widening of medial tibiotalar joint space.    Electronically Signed   By: Natasha Mead M.D.   On: 04/15/2014 12:56   Ct Head Wo Contrast  04/15/2014   CLINICAL DATA:  Question right temporal stroke  EXAM: CT HEAD WITHOUT CONTRAST  TECHNIQUE: Contiguous axial images were obtained from the base of the skull through the vertex without intravenous contrast.  COMPARISON:  CT 04/14/2014  FINDINGS: Large territory chronic infarct in the left middle cerebral artery territory involving the frontal temporal and parietal lobes as well as the left basal ganglia.  Negative for acute infarct. Right temporal lobe area appears normal on the current study. Artifact is present on the prior study. Negative for hemorrhage or mass.  IMPRESSION: Large territory chronic left MCA infarct.  No acute abnormality.   Electronically Signed   By: Marlan Palau M.D.   On: 04/15/2014 09:28   Ct Head Wo Contrast  04/14/2014   CLINICAL DATA:  Fall. Head injury. Prior stroke, nonverbal patient.  EXAM: CT HEAD WITHOUT CONTRAST  TECHNIQUE: Contiguous axial images were obtained from the base of the skull through the vertex without intravenous contrast.  COMPARISON:  Multiple exams, including 03/21/2014  FINDINGS: I am unable to locate the report from the 03/21/2014 exam. I have Engineer, production.  Large prior left MCA distribution stroke noted with associated wall Peyton Najjar and degeneration causing a small left cerebral peduncle. Stroke extends up into the white matter the vertex, with some medial involvement of the left frontal lobe as shown on image 25 of series 2, stable.  The large region of involvement appears similar to the prior exam.  Reduced gray-white differentiation inferiorly in the right temporal lobe. This is less striking on series 4 is compared to series 2 and may partially be due to motion artifact and streak artifact, but appears asymmetric enough to potentially represent a early stroke.  No intracranial hemorrhage or mass lesion identified.  IMPRESSION: 1.  Hypodensity and poor gray-white differentiation inferiorly in the right temporal lobe such that an acute CVA involving the right temporal lobe cannot be readily excluded. This is in area which can be subject to artifact simulating a stroke. Consider MRI for further characterization. 2. Old large left MCA distribution stroke.  These results were called by telephone at the time of interpretation on 04/14/2014 at 7:31 pm to Dr. Randa Spike, Romeo Apple , who verbally acknowledged these results.   Electronically Signed   By: Herbie Baltimore M.D.   On: 04/14/2014 19:34   Dg Foot 2 Views Right  04/14/2014   CLINICAL DATA:  Fall.  EXAM: RIGHT FOOT - 2 VIEW  COMPARISON:  None.  FINDINGS: Diffuse osteopenia and degenerative change. No acute bony or joint abnormality identified.  IMPRESSION: No acute bony or joint abnormality. Diffuse osteopenia and degenerative  change.   Electronically Signed   By: Maisie Fushomas  Register   On: 04/14/2014 19:34    Scheduled Meds: . acetaminophen  650 mg Oral Once  . amiodarone  200 mg Oral Daily  . carvedilol  3.125 mg Oral BID WC  . cefTRIAXone (ROCEPHIN)  IV  1 g Intravenous Q24H  . digoxin  0.125 mg Oral Daily  . escitalopram  20 mg Oral Daily  . furosemide  40 mg Oral BID  . insulin aspart  0-9 Units Subcutaneous 6 times per day  . iron polysaccharides  150 mg Oral Daily  . levETIRAcetam  2,000 mg Oral Q12H  . levothyroxine  50 mcg Oral QAC breakfast  . lisinopril  2.5 mg Oral Daily  . metolazone  2.5 mg Oral Daily  . pantoprazole  40 mg Oral Daily  . potassium chloride  20 mEq Oral Daily  . QUEtiapine Fumarate  150 mg Oral QHS  . Rivaroxaban  15 mg Oral QAC supper  . [START ON 04/18/2014] Vitamin D (Ergocalciferol)  50,000 Units Oral Q30 days   Continuous Infusions:   Active Problems:   UTI (lower urinary tract infection)   CVA (cerebral infarction)    Time spent: 35 min    Jeralyn BennettZAMORA, Taneeka Curtner  [facility] Pager [phone]. If 7PM-7AM, please contact  night-coverage at [email or web address], password [email or web address] 04/15/2014, 6:07 PM  LOS: 1 day

## 2014-04-15 NOTE — Evaluation (Signed)
Physical Therapy Evaluation Patient Details Name: RAVENNA CHIBA MRN: [id number] DOB: 11-01-1955 Today's Date: 04/15/2014   History of Present Illness  Pt is a 58 y/o female with a PMH of CVA and R sided residual weakness, and ICD. Pt presenting after fall off EOB 3 days PTA. She hit her head and rolled her ankle at that time. Imaging revealed R fibular fracture and evidence of acute CVA. Follow-up CT scan to be performed 04/15/14 in the morning.   Clinical Impression  Pt admitted with the above. Pt currently with functional limitations due to the deficits listed below (see PT Problem List). At the time of PT eval pt required assist for repositioning and scooting up in bed. However, SLP informed PT that during her session pt able to sit EOB without assist. Due to increased agitation mobility was limited during eval. Pt unable to answer any history questions at this time and information of home environment taken from previous admission. Unsure of baseline, but appears that family was performing all ADL's for pt PTA. Recommending SNF at d/c based on new fibular fracture and status during session today. Pt will benefit from skilled PT to increase their independence and safety with mobility to allow discharge to the venue listed below.    Follow Up Recommendations SNF;Supervision/Assistance - 24 hour    Equipment Recommendations  Other (comment) (TBD)    Recommendations for Other Services OT consult - if pt returning home with family    Precautions / Restrictions Precautions Precautions: Fall Restrictions Weight Bearing Restrictions: Yes RLE Weight Bearing: Non weight bearing      Mobility  Bed Mobility Overal bed mobility: Needs Assistance;+2 for physical assistance Bed Mobility: Rolling Rolling: Min assist;Max assist;Mod assist         General bed mobility comments: Pt in increased pain during session, with difficulty repositioning. PT assist pt to reposition in bed with pillow under  RLE. Mod-max assist for scooting in bed with bed pad to assist.  Transfers                 General transfer comment: Further mobility deferred due to pt agitation.   Ambulation/Gait                Stairs            Wheelchair Mobility    Modified Rankin (Stroke Patients Only)       Balance                                             Pertinent Vitals/Pain Vitals stable throughout session.     Home Living Family/patient expects to be discharged to:: Private residence Living Arrangements: Children;Other relatives Available Help at Discharge: Family;Available 24 hours/day Type of Home: House Home Access: Level entry     Home Layout: Two level;Bed/bath upstairs Home Equipment: Bedside commode;Cane - quad;Wheelchair - manual;Shower seat Additional Comments: pt is a poor historian     Prior Function Level of Independence: Needs assistance   Gait / Transfers Assistance Needed: pt reports she stays in her wheelchair most of the time  ADL's / Homemaking Assistance Needed: pt reports her son dresses her        Hand Dominance   Dominant Hand: Left    Extremity/Trunk Assessment   Upper Extremity Assessment: Defer to OT evaluation  Lower Extremity Assessment: RLE deficits/detail RLE Deficits / Details: Acute pain and decreased strength/AROM consistent with fibular fracture.     Cervical / Trunk Assessment: Normal  Communication   Communication: Expressive difficulties;Receptive difficulties  Cognition Arousal/Alertness: Awake/alert Behavior During Therapy: Agitated;Anxious;Impulsive Overall Cognitive Status: History of cognitive impairments - at baseline                      General Comments General comments (skin integrity, edema, etc.): Generally nonverbal throughout session outside of basic statements (yes, no...). Calling out throughout session and generally agitated with any movement.     Exercises         Assessment/Plan    PT Assessment Patient needs continued PT services  PT Diagnosis     PT Problem List Decreased strength;Decreased range of motion;Decreased activity tolerance;Decreased balance;Decreased mobility;Decreased knowledge of use of DME;Decreased safety awareness;Decreased knowledge of precautions;Pain  PT Treatment Interventions DME instruction;Gait training;Stair training;Functional mobility training;Therapeutic activities;Therapeutic exercise;Neuromuscular re-education;Patient/family education   PT Goals (Current goals can be found in the Care Plan section) Acute Rehab PT Goals Patient Stated Goal: unable to state PT Goal Formulation: Patient unable to participate in goal setting Time For Goal Achievement: 04/22/14 Potential to Achieve Goals: Fair    Frequency Min 3X/week   Barriers to discharge   Unsure how much assist pt has available to her at home. Overall poor historian.     Co-evaluation               End of Session   Activity Tolerance: Treatment limited secondary to agitation Patient left: in bed;with call bell/phone within reach;Other (comment) (SLP present for bedside swallow test) Nurse Communication: Mobility status         Time: [id number] PT Time Calculation (min): 10 min   Charges:   PT Evaluation $Initial PT Evaluation Tier I: 1 Procedure     PT G CodesRuthann Cancer:          Hamilton, Kaelynne Christley 04/15/2014, 9:53 AM  Ruthann CancerLaura Hamilton, PT, DPT Acute Rehabilitation Services Pager: [phone]

## 2014-04-15 NOTE — Progress Notes (Signed)
Occupational Therapy Evaluation Patient Details Name: Elizabeth Love MRN: [id number] DOB: December 26, 1955 Today's Date: 04/15/2014    History of Present Illness Pt is a 58 y/o female with a PMH of CVA and R sided residual weakness, and ICD. Pt presenting after fall off EOB 3 days PTA. She hit her head and rolled her ankle at that time. Imaging revealed R fibular fracture and evidence of acute CVA.    Clinical Impression   PTA pt reports that she lived with her son, who assisted her with ADLs and IADLs. Pt reports that she spends most of her time in her wheelchair, however pt is a poor historian. Pt presents with expressive and receptive aphasia and cognitive impairments with unknown baseline. Pt tolerated sitting EOB for ~40 minutes with OT for grooming tasks and to receive oral pain medications. Pt with improved pain control and bed mobility from earlier PT session. Pt is able to communicate, although with difficulty, given increased time. Functional mobility deferred and will likely require +2 due to NWB status on RLE and pain level. Pt would benefit from skilled OT to increase independence back to baseline.    Follow Up Recommendations  SNF;Supervision/Assistance - 24 hour    Equipment Recommendations  None recommended by OT       Precautions / Restrictions Precautions Precautions: Fall Precaution Comments: concerned about pt's ability/memory to maintain NWB status Restrictions Weight Bearing Restrictions: Yes RLE Weight Bearing: Non weight bearing      Mobility Bed Mobility Overal bed mobility: Needs Assistance Bed Mobility: Rolling;Sidelying to Sit Rolling: Min assist Sidelying to sit: Min assist;HOB elevated ((A) for RLE off bed)       General bed mobility comments: Pt reports HA and pain in stomach and hesitant to perform bed mobility. However, once sitting EOB pt was reluctant to return to supine and asked for "5 more minutes."  Transfers                 General  transfer comment: Further mobility deferred.          ADL Overall ADL's : Needs assistance/impaired Eating/Feeding: Sitting;Minimal assistance   Grooming: Wash/dry face;Minimal assistance;Sitting;Cueing for sequencing (hand over hand assistance to initiate)   Upper Body Bathing: Moderate assistance;Sitting   Lower Body Bathing: Total assistance;Sitting/lateral leans   Upper Body Dressing : Maximal assistance;Sitting   Lower Body Dressing: Total assistance;Sit to/from stand                 General ADL Comments: Pt participated in EOB sitting for evaluation this date. Pt appears to be less limited by pain this evening and was able to get EOB with Min (A) for RLE management off bed. Pt sat EOB for several minutes and performed grooming activities (hand washing and applying lotion). Pt declined OOB activities due to pain, and will complete at a later time. Educated pt on NWB status, but am concerned about pt's ability to maintain due to cognitive impairments. Pt reports that her son helps with her ADLs. When OT put lotion in pt's hand, pt stated "I don't know how." Pt demonstrates some expressive perseveration, however attempted to communicate and able to make full sentences when given increased time.      Vision  Pt reports no change from baseline (however poor historian).   Pt wears glasses at all times.             Additional Comments: Difficult to assess vision due to decreased cognition and attention. Pt reports no  change from baseline, however is a poor historian. Pt unable to read OT"s watch, but likely cognitive in nature.    Perception Perception Perception Tested?: No   Praxis Praxis Praxis tested?: Deficits Deficits: Initiation;Organization    Pertinent Vitals/Pain Pt c/o HA and abdominal pain but unable to describe or rate. RN administered pain medications     Hand Dominance Left   Extremity/Trunk Assessment Upper Extremity Assessment Upper Extremity  Assessment: RUE deficits/detail RUE Deficits / Details: RUE with flaccidity grossly, spasticity noted in hand   Lower Extremity Assessment Lower Extremity Assessment: Defer to PT evaluation   Cervical / Trunk Assessment Cervical / Trunk Assessment: Normal   Communication Communication Communication: Expressive difficulties;Receptive difficulties   Cognition Arousal/Alertness: Awake/alert Behavior During Therapy: Restless;Anxious;Impulsive Overall Cognitive Status: No family/caregiver present to determine baseline cognitive functioning Area of Impairment: Orientation;Attention;Memory;Following commands;Safety/judgement;Awareness;Problem solving Orientation Level: Person Current Attention Level: Focused Memory: Decreased recall of precautions;Decreased short-term memory Following Commands: Follows one step commands with increased time Safety/Judgement: Decreased awareness of safety;Decreased awareness of deficits Awareness: Intellectual Problem Solving: Slow processing;Decreased initiation;Difficulty sequencing;Requires verbal cues;Requires tactile cues General Comments: Pt's son not available; pt has baseline history of cognitive impairment.    General Comments    Pt less agitated and not calling out. Pt attempted to communicate with OT given increased time. Pt explained that her son works at the [employer], which they live near.      Exercises Exercises: General Upper Extremity     04/15/14 1700  General Exercises - Upper Extremity  Shoulder Flexion AROM;Left;5 reps;Seated  Elbow Flexion AROM;Left;5 reps;Seated  Elbow Extension AROM;Left;5 reps;Seated  Wrist Flexion PROM;Right;5 reps;Seated  Wrist Extension PROM;Right;5 reps;Seated  Digit Composite Flexion PROM;Right;5 reps;Seated  Composite Extension PROM;Right;5 reps;Seated          Home Living Family/patient expects to be discharged to:: Private residence Living Arrangements: Children;Other relatives Available Help at  Discharge: Family;Available 24 hours/day Type of Home: House Home Access: Level entry     Home Layout: Two level;Bed/bath upstairs Alternate Level Stairs-Number of Steps: flight Alternate Level Stairs-Rails: Right Bathroom Shower/Tub: Chief Strategy OfficerTub/shower unit   Bathroom Toilet: Standard Bathroom Accessibility: Yes How Accessible: Accessible via walker Home Equipment: Bedside commode;Cane - quad;Wheelchair - manual;Shower seat   Additional Comments: pt is a poor historian; however confirmed information received from previous hospitalizations for home living      Prior Functioning/Environment Level of Independence: Needs assistance  Gait / Transfers Assistance Needed: pt reports that she stays in her wheelchair most of the time.  ADL's / Homemaking Assistance Needed: Pt reports that her son helps with all ADLs.         OT Diagnosis: Generalized weakness;Cognitive deficits;Acute pain;Hemiplegia non-dominant side   OT Problem List: Decreased strength;Decreased range of motion;Decreased activity tolerance;Impaired balance (sitting and/or standing);Decreased cognition;Decreased safety awareness;Decreased knowledge of use of DME or AE;Decreased knowledge of precautions;Impaired UE functional use;Pain   OT Treatment/Interventions: Self-care/ADL training;Therapeutic exercise;Energy conservation;DME and/or AE instruction;Therapeutic activities;Cognitive remediation/compensation;Patient/family education;Balance training    OT Goals(Current goals can be found in the care plan section) Acute Rehab OT Goals Patient Stated Goal: to get help OT Goal Formulation: With patient Time For Goal Achievement: 04/29/14 Potential to Achieve Goals: Fair ADL Goals Pt Will Perform Eating: with modified independence;sitting Pt Will Perform Grooming: with set-up;sitting Pt Will Transfer to Toilet: with min assist (scoot/lateral transfer; drop arm BSC)  OT Frequency: Min 2X/week              End of Session  Nurse Communication:  Patient requests pain meds  Activity Tolerance: Patient tolerated treatment well Patient left: in bed;with call bell/phone within reach;with bed alarm set   Time: 1607-3710 OT Time Calculation (min): 51 min Charges:  OT General Charges $OT Visit: 1 Procedure OT Evaluation $Initial OT Evaluation Tier I: 1 Procedure OT Treatments $Self Care/Home Management : 23-37 mins $Therapeutic Exercise: 8-22 mins  Rae Lips [phone] 04/15/2014, 5:38 PM

## 2014-04-15 NOTE — Care Management Note (Addendum)
    Page 1 of 2   04/17/2014     1:51:59 PM CARE MANAGEMENT NOTE 04/17/2014  Patient:  Elizabeth Love, Elizabeth Love   Account Number:  [id number]  Date Initiated:  04/15/2014  Documentation initiated by:  [facility]  Subjective/Objective Assessment:   58 y.o. female PMHx CVA with residual R sided hemiparesis, aphasia and behavioral disturbance, med noncompliance, systolic CHF w/ recent EF 15% and diffuse hypokinesis s/p pacer. Presents with CVA, UTI, R fibula fx.// Home with kids     Action/Plan:   -head CT indicative of new stroke  -MRI contradicated given ICD  -stroke workup including carotid dopplers, 2D ECHO, risk stratification labs, CTA  -f/u on neuro recs  -IV abx.// Resume HH services   Anticipated DC Date:  04/17/2014   Anticipated DC Plan:  HOME W HOME HEALTH SERVICES      DC Planning Services  CM consult      [facility] Choice  Resumption Of Svcs/PTA Provider   Choice offered to / List presented to:          [facility] arranged  HH-1 RN  HH-10 DISEASE MANAGEMENT  HH-2 PT  HH-3 OT      [facility] agency  [facility]   Status of service:  Completed, signed off Medicare Important Message given?  YES (If response is "NO", the following Medicare IM given date fields will be blank) Date Medicare IM given:  04/17/2014 Medicare IM given by:  [facility] Date Additional Medicare IM given:   Additional Medicare IM given by:    Discharge Disposition:    Per UR Regulation:  Reviewed for med. necessity/level of care/duration of stay  If discussed at Long Length of Stay Meetings, dates discussed:    Comments:  04/15/14 Oletta Cohn, RN, BSN, NCM [phone] Pt currently active with [facility] for RN/PT/OT services.  Resumption of care requested.  Darlin Drop, RN of [facility] notified.  No DME needs identified at this time.

## 2014-04-15 NOTE — Evaluation (Signed)
Speech Language Pathology Evaluation Patient Details Name: Elizabeth HumanVanessa M Spaziani MRN: [id number] DOB: 08/29/1956 Today's Date: 04/15/2014 Time: [id number] SLP Time Calculation (min): 25 min  Problem List:  Patient Active Problem List   Diagnosis Date Noted  . CVA (cerebral infarction) 04/15/2014  . UTI (lower urinary tract infection) 04/14/2014  . Acute on chronic respiratory failure 03/21/2014  . Systolic CHF, acute on chronic 03/21/2014  . Acute encephalopathy 03/21/2014  . Seizures 03/21/2014  . Headache 08/31/2013  . Chest pain 08/05/2013  . Acute combined systolic and diastolic heart failure 07/21/2013  . Acute respiratory failure 07/21/2013  . NICM (nonischemic cardiomyopathy)   . History of DVT (deep vein thrombosis)   . History of stroke with residual effects   . Hypertension   . Asthma   . Renal disorder   . Chronic systolic CHF (congestive heart failure)   . Atrial fibrillation   . VT (ventricular tachycardia)   . 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 DM type 2 (diabetes mellitus, type 2) 12/02/2012  . Noncompliance 09/19/2012  . Chronic pain 09/19/2012  . Protein C deficiency 08/25/2012  . Hypokalemia 08/24/2012  . Seizure disorder 08/04/2012  . Thrombocytopenia 07/19/2012  . Hypothyroidism 07/18/2012  . Aphasia as late effect of cerebrovascular accident 04/23/2012  . Narcotic abuse 04/23/2012   Past Medical History:  Past Medical History  Diagnosis Date  . Seizures   . Hypertension   . Stroke     2007 - R sided weakness and expressive aphasia with h/o behavioral problems related to this  . Asthma   . Diabetes mellitus   . Chronic systolic CHF (congestive heart failure)   . Atrial fibrillation     a. On Xarelto after having DVT (prev felt to be poor anticoag cand 2/2 noncompliance).  . History of DVT (deep vein thrombosis)     a. 06/2012: RLE DVT, + protein C deficiency, placed on Xarelto. s/p IVC filter  . Noncompliance     Due  to mental status, family has had to coax her to take medicines  . Hypothyroidism   . VT (ventricular tachycardia)     a. h/o ICD ~2007-2008. b. Adm 08/2012 with UTI, had VT/ICD shock x 5 (hypokalemic);  c. 06/2013 s/p ICD Gen change: SJM DC ICD ser #: [id number]  . Protein C deficiency   . NICM (nonischemic cardiomyopathy)     a. s/p AICD ~2008 (St. Jude) [place] No known hx CAD. b. EF 10% by echo 04/2012;  c. 06/2013 s/p ICD Gen change: SJM DC ICD ser #: [id number]  . 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 Panic attacks   . Anxiety    Past Surgical History:  Past Surgical History  Procedure Laterality Date  . Cholecystectomy    . Appendectomy    . Pacemaker insertion      St. Judes  . Vena cava filter placement  07/19/2012    Procedure: INSERTION VENA-CAVA FILTER;  Surgeon: Larina Earthlyodd F Early, MD;  [facility];  Service: Vascular;  Laterality: [name];  . Insert / replace / remove pacemaker  2014    ICD. St. Jude; inserted 2008 [facility]([place]); gen change 2014 SK  . Abdominal hysterectomy     HPI:  Pt is 58 y.o. with hx of CVA in 2007 with residual R side weakness and aphasia/ behavioral disturbance, medication noncompliance, systolic CHF w/ recent EF 15% and diffuse hypokinesis s/p pacemaker, seizure disorder, afib on  xarelto, presenting with CVA, R fibular fracture, UTI. Admitted 7/4-7/7 with acute on chronic systolic heart failure, abd pain w/ anemia. Fell off bed 7/26, rolling ankle. CXR showed severe cardiomegaly w/ pulm vascular congestion. Head CT showed changes over R temporal lobe concerning for CVA. Also presenting with UTI. Pt initially passed stroke swallow screen but then made NPO d/t concerns with current cognitive status affecting swallow function; SLP order to further evaluate swallow function.   Assessment / Plan / Recommendation Clinical Impression  Pt has baseline deficits from CVA in 2007- reportedly expressive aphasia and behavioral disturbances. Family not  at bedside- unclear whether pt is currently at baseline level of function for cognitive-linguistic skills. Pt currently in a great deal of pain and discomfort at bedside and was unable to verbally state what was causing pain; however she successfully used gestures to point to foot and express desire for reposition. Some automatic verbalized phrases- "no" and "I can't" occurred during eval and was able to say first name, but this was the extent of verbalized communication- further evaluation of abilities limited d/t pain. Pt was able to follow some basic 1-step directions during bedside swallow eval but unable to follow multistep directions- unclear whether d/t pain interference or decreased auditory comprehension. Demonstrated decreased understanding/ problem solving while repositioning- told pt it would be best to keep foot on bed but repeatedly attempted to have feet hanging off side of bed. Head CT 7/29 showed no acute abnormality. Given current language/ cognitive function, the pt's safety is at risk with decreased basic problem solving and ability to express basic wants/ needs. ST will continue to follow to further assess and determine baseline level of function if possible, and to provide treatment to facilitate receptive/ expressive language and cognition.    SLP Assessment  Patient needs continued Speech Lanaguage Pathology Services    Follow Up Recommendations  24 hour supervision/assistance; SNF   Frequency and Duration min 2 x/week  2 weeks   Pertinent Vitals/Pain n/a   SLP Goals  SLP Goals Potential to Achieve Goals: Fair Potential Considerations: Previous level of function  SLP Evaluation Prior Functioning  Cognitive/Linguistic Baseline: Baseline deficits Baseline deficit details: reported residual R side weakness and expressive aphasia from CVA in 2007, along with baseline behavioral disturbance Type of Home: House Available Help at Discharge: Family;Available 24 hours/day    Cognition  Overall Cognitive Status: No family/caregiver present to determine baseline cognitive functioning Arousal/Alertness: Awake/alert Orientation Level: Oriented to person;Disoriented to place;Disoriented to time;Disoriented to situation Attention: Sustained Sustained Attention: Impaired Sustained Attention Impairment: Verbal basic Problem Solving: Impaired Problem Solving Impairment: Functional basic Behaviors: Restless;Impulsive;Physical agitation Safety/Judgment: Impaired    Comprehension  Auditory Comprehension Overall Auditory Comprehension: Impaired at baseline Yes/No Questions: Within Functional Limits Commands: Impaired One Step Basic Commands: 75-100% accurate Conversation: Simple EffectiveTechniques: Visual/Gestural cues    Expression Expression Primary Mode of Expression: Other (comment) (today primarily vocalizations and gestures) Verbal Expression Overall Verbal Expression: Impaired at baseline Automatic Speech: Name Level of Generative/Spontaneous Verbalization: Word Non-Verbal Means of Communication: Gestures Written Expression Dominant Hand: Left   Oral / Motor Oral Motor/Sensory Function Overall Oral Motor/Sensory Function: Other (comment) (unable to fully assess) Motor Speech Overall Motor Speech: Appears within functional limits for tasks assessed   GO     Metro Kung, MA, CCC-SLP 04/15/2014, 10:12 AM

## 2014-04-15 NOTE — Consult Note (Signed)
Neurology Consultation Reason for Consult: Abnormal CT Referring Physician: Alvester MorinNewton, S.  CC: Pain  History is obtained from: Medical record  HPI: Elizabeth Love is a 58 y.o. 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 She had a fall and hit her head and given that she is also on Coumadin, head CT was obtained which is negative for hemorrhage   LKW: Unknown tpa given?: no, unknown time of onset    ROS: Unable to obtain due to altered mental status.   Past Medical History  Diagnosis Date  . Seizures   . Hypertension   . Stroke     2007 - R sided weakness and expressive aphasia with h/o behavioral problems related to this  . Asthma   . Diabetes mellitus   . Chronic systolic CHF (congestive heart failure)   . Atrial fibrillation     a. On Xarelto after having DVT (prev felt to be poor anticoag cand 2/2 noncompliance).  . History of DVT (deep vein thrombosis)     a. 06/2012: RLE DVT, + protein C deficiency, placed on Xarelto. s/p IVC filter  . Noncompliance     Due to mental status, family has had to coax her to take medicines  . Hypothyroidism   . VT (ventricular tachycardia)     a. h/o ICD ~2007-2008. b. Adm 08/2012 with UTI, had VT/ICD shock x 5 (hypokalemic);  c. 06/2013 s/p ICD Gen change: SJM DC ICD ser #: [id number]  . Protein C deficiency   . NICM (nonischemic cardiomyopathy)     a. s/p AICD ~2008 (St. Jude) [place]. No known hx CAD. b. EF 10% by echo 04/2012;  c. 06/2013 s/p ICD Gen change: SJM DC ICD ser #: [id number]  . 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 Panic attacks   . Anxiety     Family History: Unable to assess secondary to patient's altered mental status.    Social History: Tob: Unable to assess secondary to patient's altered mental status.    Exam: Current vital signs: BP 106/62  Pulse 76  Temp(Src) 98 F (36.7 C) (Oral)   Resp 18  Ht 5' (1.524 m)  Wt 52.663 kg (116 lb 1.6 oz)  BMI 22.67 kg/m2  SpO2 93% Vital signs in last 24 hours: Temp:  [98 F (36.7 C)-98.4 F (36.9 C)] 98 F (36.7 C) (07/28 2341) Pulse Rate:  [76-90] 76 (07/28 2341) Resp:  [16-22] 18 (07/28 2341) BP: (106-121)/(47-78) 106/62 mmHg (07/28 2341) SpO2:  [90 %-98 %] 93 % (07/28 2341) Weight:  [52.663 kg (116 lb 1.6 oz)-63.504 kg (140 lb)] 52.663 kg (116 lb 1.6 oz) (07/28 2341)  General: In bed, NAD CV: Regular in rhythm Mental Status: Patient is awake, alert, she answers a few rehearsed questions "how are you", "fine" she is able to follow some simple commands such as close eyes and stick out tongue, but no more complicated commands such as show 2 fingers or perform finger-nose-finger. Cranial Nerves: II: Does not blink to threat from right, does from left Pupils are equal, round, and reactive to light.  Discs are difficult to visualize. III,IV, VI: Cross is midline in both directions V: Facial sensation is unclear, but possibly reduced on right VII: Facial movement is notable for mild right weakness  VIII: hearing is intact to voice X: Uvula elevates symmetrically XI: Shoulder shrug is symmetric. XII: tongue  goes slightly to the right  Motor: She moves her left side well, but does not move her right arm at all, right leg is in a cast Sensory: Does flinch to noxious stimulation on the right Deep Tendon Reflexes: 2+ on the left in the arm and leg, unable to perform on right due to cast and patient positioning. Cerebellar: Unable to assess secondary to patient's altered mental status.  Gait: Unable to assess secondary to patient's altered mental status.          I have reviewed labs in epic and the results pertinent to this consultation are: Supratherapeutic INR  I have reviewed the images obtained: CT head-hypodensity in the anterior temporal region. This is a very common area for artifacts the patient appears asymmetric  in the scanner. It does represent acute stroke, then I would expect evolution on repeat head CT.  Impression: 58 year old female with possible anterior temporal stroke on the right, though I suspect this is artifactual.  Recommendations: 1) repeat head CT in the morning, full stroke workup if continues to be positive.   Ritta Slot, MD [facility] [phone]  If 7pm- 7am, please page neurology on call as listed in AMION.

## 2014-04-15 NOTE — Progress Notes (Signed)
Stroke Team Progress Note  SUBJECTIVE  Elizabeth Love is a 58 y.o. 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 She had a fall and hit her head and given that she is also on Coumadin, head CT was obtained which is negative for hemorrhage  LKW: Unknown  tpa given?: no, unknown time of onset   Overnight she has remained neurologically stable without any new neurological complaints. 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 The previously stated right temporal abnormality was likely an artifact.     OBJECTIVE Most recent Vital Signs: Temp: 97.1 F (36.2 C) (07/29 1500) Temp src: Oral (07/29 1500) BP: 120/77 mmHg (07/29 1500) Pulse Rate: 79 (07/29 1500) Respiratory Rate: 18 O2 Saturdation: 100%  CBG (last 3)   Recent Labs  04/15/14 0333 04/15/14 0726 04/15/14 1205  GLUCAP 105* 102* 110*    Diet: Cardiac    Activity: Up with assistance VTE Prophylaxis:  SCds  Studies: Results for orders placed during the hospital encounter of 04/14/14 (from the past 24 hour(s))  CBG MONITORING, ED     Status: Abnormal   Collection Time    04/14/14  4:40 PM      Result Value Ref Range   Glucose-Capillary 104 (*) 70 - 99 mg/dL  CBC WITH DIFFERENTIAL     Status: Abnormal   Collection Time    04/14/14  5:32 PM      Result Value Ref Range   WBC 5.7  4.0 - 10.5 K/uL   RBC 4.09  3.87 - 5.11 MIL/uL   Hemoglobin 9.2 (*) 12.0 - 15.0 g/dL   HCT 40.932.3 (*) 81.136.0 - 91.446.0 %   MCV 79.0  78.0 - 100.0 fL   MCH 22.5 (*) 26.0 - 34.0 pg   MCHC 28.5 (*) 30.0 - 36.0 g/dL   RDW 78.224.3 (*) 95.611.5 - 21.315.5 %   Platelets 250  150 - 400 K/uL   Neutrophils Relative % 68  43 - 77 %   Neutro Abs 3.9  1.7 - 7.7 K/uL   Lymphocytes Relative 18  12 - 46 %   Lymphs Abs  1.1  0.7 - 4.0 K/uL   Monocytes Relative 12  3 - 12 %   Monocytes Absolute 0.7  0.1 - 1.0 K/uL   Eosinophils Relative 2  0 - 5 %   Eosinophils Absolute 0.1  0.0 - 0.7 K/uL   Basophils Relative 1  0 - 1 %   Basophils Absolute 0.0  0.0 - 0.1 K/uL  COMPREHENSIVE METABOLIC PANEL     Status: Abnormal   Collection Time    04/14/14  5:32 PM      Result Value Ref Range   Sodium 145  137 - 147 mEq/L   Potassium 3.6 (*) 3.7 - 5.3 mEq/L   Chloride 102  96 - 112 mEq/L   CO2 29  19 - 32 mEq/L   Glucose, Bld 114 (*) 70 - 99 mg/dL   BUN 15  6 - 23 mg/dL   Creatinine, Ser 0.861.20 (*) 0.50 - 1.10 mg/dL   Calcium 9.7  8.4 - 57.810.5 mg/dL   Total Protein 8.3  6.0 - 8.3 g/dL   Albumin 3.1 (*) 3.5 - 5.2 g/dL   AST 37  0 -  37 U/L   ALT 17  0 - 35 U/L   Alkaline Phosphatase 65  39 - 117 U/L   Total Bilirubin 2.0 (*) 0.3 - 1.2 mg/dL   GFR calc non Af Amer 50 (*) >90 mL/min   GFR calc Af Amer 58 (*) >90 mL/min   Anion gap 14  5 - 15  PROTIME-INR     Status: Abnormal   Collection Time    04/14/14  5:32 PM      Result Value Ref Range   Prothrombin Time 42.9 (*) 11.6 - 15.2 seconds   INR 4.52 (*) 0.00 - 1.49  URINALYSIS, ROUTINE W REFLEX MICROSCOPIC     Status: Abnormal   Collection Time    04/14/14  6:11 PM      Result Value Ref Range   Color, Urine YELLOW  YELLOW   APPearance CLEAR  CLEAR   Specific Gravity, Urine 1.009  1.005 - 1.030   pH 6.5  5.0 - 8.0   Glucose, UA NEGATIVE  NEGATIVE mg/dL   Hgb urine dipstick MODERATE (*) NEGATIVE   Bilirubin Urine NEGATIVE  NEGATIVE   Ketones, ur NEGATIVE  NEGATIVE mg/dL   Protein, ur NEGATIVE  NEGATIVE mg/dL   Urobilinogen, UA 4.0 (*) 0.0 - 1.0 mg/dL   Nitrite NEGATIVE  NEGATIVE   Leukocytes, UA MODERATE (*) NEGATIVE  URINE MICROSCOPIC-ADD ON     Status: Abnormal   Collection Time    04/14/14  6:11 PM      Result Value Ref Range   Squamous Epithelial / LPF RARE  RARE   WBC, UA 3-6  <3 WBC/hpf   RBC / HPF 3-6  <3 RBC/hpf   Bacteria, UA MANY (*) RARE   CBG MONITORING, ED     Status: Abnormal   Collection Time    04/14/14 10:34 PM      Result Value Ref Range   Glucose-Capillary 122 (*) 70 - 99 mg/dL  GLUCOSE, CAPILLARY     Status: Abnormal   Collection Time    04/15/14 12:44 AM      Result Value Ref Range   Glucose-Capillary 106 (*) 70 - 99 mg/dL  HEMOGLOBIN Z6X     Status: Abnormal   Collection Time    04/15/14  1:35 AM      Result Value Ref Range   Hemoglobin A1C 8.0 (*) <5.7 %   Mean Plasma Glucose 183 (*) <117 mg/dL  LIPID PANEL     Status: Abnormal   Collection Time    04/15/14  1:35 AM      Result Value Ref Range   Cholesterol 73  0 - 200 mg/dL   Triglycerides 56  <096 mg/dL   HDL 20 (*) >04 mg/dL   Total CHOL/HDL Ratio 3.7     VLDL 11  0 - 40 mg/dL   LDL Cholesterol 42  0 - 99 mg/dL  CBC     Status: Abnormal   Collection Time    04/15/14  1:35 AM      Result Value Ref Range   WBC 5.9  4.0 - 10.5 K/uL   RBC 3.87  3.87 - 5.11 MIL/uL   Hemoglobin 8.6 (*) 12.0 - 15.0 g/dL   HCT 54.0 (*) 98.1 - 19.1 %   MCV 78.6  78.0 - 100.0 fL   MCH 22.2 (*) 26.0 - 34.0 pg   MCHC 28.3 (*) 30.0 - 36.0 g/dL   RDW 47.8 (*) 29.5 - 62.1 %   Platelets 245  150 - 400 K/uL  CREATININE, SERUM     Status: Abnormal   Collection Time    04/15/14  1:35 AM      Result Value Ref Range   Creatinine, Ser 0.96  0.50 - 1.10 mg/dL   GFR calc non Af Amer 65 (*) >90 mL/min   GFR calc Af Amer 76 (*) >90 mL/min  PRO B NATRIURETIC PEPTIDE     Status: Abnormal   Collection Time    04/15/14  1:35 AM      Result Value Ref Range   Pro B Natriuretic peptide (BNP) 10371.0 (*) 0 - 125 pg/mL  PROTIME-INR     Status: Abnormal   Collection Time    04/15/14  1:35 AM      Result Value Ref Range   Prothrombin Time 36.0 (*) 11.6 - 15.2 seconds   INR 3.61 (*) 0.00 - 1.49  GLUCOSE, CAPILLARY     Status: Abnormal   Collection Time    04/15/14  3:33 AM      Result Value Ref Range   Glucose-Capillary 105 (*) 70 - 99 mg/dL  GLUCOSE, CAPILLARY     Status:  Abnormal   Collection Time    04/15/14  7:26 AM      Result Value Ref Range   Glucose-Capillary 102 (*) 70 - 99 mg/dL  GLUCOSE, CAPILLARY     Status: Abnormal   Collection Time    04/15/14 12:05 PM      Result Value Ref Range   Glucose-Capillary 110 (*) 70 - 99 mg/dL   Comment 1 Notify RN       Dg Chest 1 View  04/14/2014   CLINICAL DATA:  Fall  EXAM: CHEST - 1 VIEW  COMPARISON:  Prior radiograph from 03/21/2014  FINDINGS: Left-sided pacemaker/AICD is unchanged. Severe cardiomegaly grossly stable. Mediastinal silhouette within normal limits.  The lungs are normally inflated. Mild diffuse pulmonary vascular congestion without frank pulmonary edema. No definite pleural effusion. No focal infiltrate. No pneumothorax.  No acute osseous abnormality identified.  IMPRESSION: Severe cardiomegaly with mild diffuse pulmonary vascular congestion without overt pulmonary edema. No other acute cardiopulmonary abnormality.   Electronically Signed   By: Rise Mu M.D.   On: 04/14/2014 19:29   Dg Chest 2 View  04/15/2014   CLINICAL DATA:  Stroke, weakness, fall  EXAM: CHEST  2 VIEW  COMPARISON:  04/14/2014  FINDINGS: Cardiomegaly again noted. Central vascular congestion without convincing pulmonary edema. Dual lead cardiac pacemaker is unchanged in position. There is bilateral basilar atelectasis or scarring.  IMPRESSION: Cardiomegaly. Central vascular congestion without convincing pulmonary edema. Bilateral basilar atelectasis or scarring.   Electronically Signed   By: Natasha Mead M.D.   On: 04/15/2014 12:52   Dg Hip Complete Right  04/14/2014   CLINICAL DATA:  Diffuse right lower extremity pain secondary to a fall from the bed 3 days ago.  EXAM: RIGHT HIP - COMPLETE 2+ VIEW  COMPARISON:  None.  FINDINGS: There is no evidence of hip fracture or dislocation. There is no evidence of arthropathy or other focal bone abnormality.  IMPRESSION: No osseous abnormality.   Electronically Signed   By: Geanie Cooley M.D.   On: 04/14/2014 19:38   Dg Knee 2 Views Right  04/14/2014   CLINICAL DATA:  Fall from bed.  Diffuse right lower extremity pain.  EXAM: RIGHT KNEE - 1-2 VIEW  COMPARISON:  07/18/2012  FINDINGS: Diffuse bony demineralization. Poor definition of fat plane surround knee make  today's exam indeterminate for knee effusion. Suspected diffuse subcutaneous edema. No cortical discontinuity identified to suggest fracture.  IMPRESSION: 1. Diffuse bony demineralization. 2. Suspected subcutaneous edema. Indeterminate exam for knee effusion.   Electronically Signed   By: Herbie Baltimore M.D.   On: 04/14/2014 19:36   Dg Tibia/fibula Right  04/14/2014   CLINICAL DATA:  Fall.  EXAM: RIGHT TIBIA AND FIBULA - 2 VIEW  COMPARISON:  None.  FINDINGS: Slightly angulated fracture of the distal right fibular shaft is present. Tibia is intact.  IMPRESSION: Slightly angulated fracture of the distal right fibular shaft.   Electronically Signed   By: Maisie Fus  Register   On: 04/14/2014 19:36   Dg Ankle 2 Views Right  04/14/2014   CLINICAL DATA:  Fall  EXAM: RIGHT ANKLE - 2 VIEW  COMPARISON:  None.  FINDINGS: There is an acute transverse fracture through the distal right fibular shaft. Ankle mortise is grossly approximated. No other definite fracture.  Mild diffuse soft tissue swelling present left ankle.  Diffuse osteopenia present.  IMPRESSION: Acute nondisplaced transverse fracture of the distal right fibular shaft.   Electronically Signed   By: Rise Mu M.D.   On: 04/14/2014 19:35   Dg Ankle Complete Right  04/15/2014   CLINICAL DATA:  Followup fracture  EXAM: RIGHT ANKLE - COMPLETE 3+ VIEW  COMPARISON:  04/14/2014  FINDINGS: Study is limited by osteopenia. There is casting material artifact. Again noted oblique minimal displaced fracture in distal fibula. Question mild disruption of ankle mortise with mild widening of medial tibiotalar space.  IMPRESSION: Again noted to oblique minimal displaced fracture of  distal fibula. Question mild disruption of ankle mortise with widening of medial tibiotalar joint space.   Electronically Signed   By: Natasha Mead M.D.   On: 04/15/2014 12:56   Ct Head Wo Contrast  04/15/2014   CLINICAL DATA:  Question right temporal stroke  EXAM: CT HEAD WITHOUT CONTRAST  TECHNIQUE: Contiguous axial images were obtained from the base of the skull through the vertex without intravenous contrast.  COMPARISON:  CT 04/14/2014  FINDINGS: Large territory chronic infarct in the left middle cerebral artery territory involving the frontal temporal and parietal lobes as well as the left basal ganglia.  Negative for acute infarct. Right temporal lobe area appears normal on the current study. Artifact is present on the prior study. Negative for hemorrhage or mass.  IMPRESSION: Large territory chronic left MCA infarct.  No acute abnormality.   Electronically Signed   By: Marlan Palau M.D.   On: 04/15/2014 09:28   Ct Head Wo Contrast  04/14/2014   CLINICAL DATA:  Fall. Head injury. Prior stroke, nonverbal patient.  EXAM: CT HEAD WITHOUT CONTRAST  TECHNIQUE: Contiguous axial images were obtained from the base of the skull through the vertex without intravenous contrast.  COMPARISON:  Multiple exams, including 03/21/2014  FINDINGS: I am unable to locate the report from the 03/21/2014 exam. I have Engineer, production.  Large prior left MCA distribution stroke noted with associated wall Peyton Najjar and degeneration causing a small left cerebral peduncle. Stroke extends up into the white matter the vertex, with some medial involvement of the left frontal lobe as shown on image 25 of series 2, stable.  The large region of involvement appears similar to the prior exam.  Reduced gray-white differentiation inferiorly in the right temporal lobe. This is less striking on series 4 is compared to series 2 and may partially be due to motion artifact and streak  artifact, but appears asymmetric enough to  potentially represent a early stroke.  No intracranial hemorrhage or mass lesion identified.  IMPRESSION: 1. Hypodensity and poor gray-white differentiation inferiorly in the right temporal lobe such that an acute CVA involving the right temporal lobe cannot be readily excluded. This is in area which can be subject to artifact simulating a stroke. Consider MRI for further characterization. 2. Old large left MCA distribution stroke.  These results were called by telephone at the time of interpretation on 04/14/2014 at 7:31 pm to Dr. Randa Spike, Romeo Apple , who verbally acknowledged these results.   Electronically Signed   By: Herbie Baltimore M.D.   On: 04/14/2014 19:34   Dg Foot 2 Views Right  04/14/2014   CLINICAL DATA:  Fall.  EXAM: RIGHT FOOT - 2 VIEW  COMPARISON:  None.  FINDINGS: Diffuse osteopenia and degenerative change. No acute bony or joint abnormality identified.  IMPRESSION: No acute bony or joint abnormality. Diffuse osteopenia and degenerative change.   Electronically Signed   By: Maisie Fus  Register   On: 04/14/2014 19:34    Physical Exam:    Frail middle aged african american aldy not in distress.Awake alert. Afebrile. Head is nontraumatic. Neck is supple without bruit. Hearing is normal. Cardiac exam no murmur or gallop. Lungs are clear to auscultation. Distal pulses are well felt. Neurological Exam :   Patient is awake, alert, she answers a few rehearsed questions "how are you", "fine" she is able to follow some simple commands such as close eyes and stick out tongue, but no more complicated commands such as show 2 fingers or perform finger-nose-finger.  Cranial Nerves:  II: Does not blink to threat from right, does from left Pupils are equal, round, and reactive to light. Discs are difficult to visualize.  III,IV, VI: Cross is midline in both directions  V: Facial sensation is unclear, but possibly reduced on right  VII: Facial movement is notable for mild right weakness  VIII: hearing is  intact to voice  X: Uvula elevates symmetrically  XI: Shoulder shrug is symmetric.  XII: tongue goes slightly to the right  Motor:  She moves her left side well, but does not move her right arm at all, right leg is in a cast  Sensory:  Does flinch to noxious stimulation on the right  Deep Tendon Reflexes:  2+ on the left in the arm and leg, unable to perform on right due to cast and patient positioning.  Cerebellar:  Unable to assess secondary to patient's altered mental status.  Gait:  Unable to assess secondary to patient's altered mental status.   ASSESSMENT Elizabeth Love is a 58 y.o. 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 She had a fall of  From unexplained  etiology with resultant fracture leg. There have been no clearly identified focal neurological symptoms to suggest new stroke. Initial CT scan of the head raised the question of right temporal lobe abnormality but followup CT scan shows no changes indicating that the prior abnormality was an artifact.,    I do not feel any further stroke evaluation is necessary at the present time Hospital day # 1  TREATMENT/PLAN CONTINUE XARELTO FOR STROKE PREVENTION GIVEN HISTORY OF ATRIAL FIBRILLATION AND KEPPRA FOR SEIZURE PROPHYLAXIS. NO FURTHER STROKE EVALUATION IS NECESSARY. STROKE TEAM WILL SIGN OFF. AND CALL FOR QUESTIONS.  Delia Heady, MD [facility] Pager: [phone] 04/15/2014 4:24 PM

## 2014-04-15 NOTE — Progress Notes (Signed)
Pt with 2 non-sustained 8 beat runs of wide QRS, pt asymptomatic and asleep. NP paged with amion. Will continue to monitor. Huel Coventry, RN

## 2014-04-15 NOTE — Progress Notes (Signed)
This RN initially passed pt on swallow screen eval, however, pt is confused, aphasic, not safe to eat/drink without full supervision. SLP eval ordered. Passed along to [employer] nurse [place].

## 2014-04-15 NOTE — Progress Notes (Signed)
CSW observed that PT has recommended SNF or 24hr care. Pt only oriented to person. CSW unable to reach pt's brother and CSW has left a message for pt's son.  Full assessment to follow once communication is made with family.  Theresia Bough, MSW, LCSW [phone]

## 2014-04-15 NOTE — H&P (Signed)
Hospitalist Admission History and Physical  Patient name: Elizabeth Love Wohlers Medical record number: [id number] Date of birth: 02/24/1956 Age: 58 y.o. Gender: female  Primary Care Provider: Lemont Fillers'SULLIVAN,MELISSA S., NP  Chief Complaint: CVA, R fibular fracture, UTI  History of Present Illness:This is a 58 y.o. year old female with multiple medical problems including CVA with residual R sided hemiparesis, aphasia and behavioral disturbance, medication noncompliance, systolic CHF w/ recent EF 15% and diffuse hypokinesis s/p pacemaker, seizure disorder, afib on xarelto,  presenting with CVA, R fibular fracture, UTI. Level V caveat as pt has baseline aphasia and behavioral dysfunction. Pt noted to have been admitted 7/4-7/7 for multiple issues including acute on chronic systolic heart failure, abd pain w/ anemia among others. Please see discharge summary for full details.   Per report, pt fell off edge of bed 3 days ago rolling her ankle. Pt was noted to have pain and swelling in RLE. Pt was directed to ER to eval for possible blood clot. [facility] Was noted to have non displaced distal fibular shaft fracture above ankle mortise. Other 2 view imaging including foot, hip, knee xrays WNL. CXR hows severe cardiomegaly w/ pulm vascular congestion. Head CT showed changes over R temporal lobe concerning for CVA. Notable labs including hgb 9.2, K 3.6, Cr 1.2, INR 4.52. UA indicative of infection.   Assessment and Plan: Elizabeth Love Alatorre is a 58 y.o. year old female presenting with CVA, R fibular fracture, UTI   Active Problems:   UTI (lower urinary tract infection)   CVA (cerebral infarction)   1-CVA -head CT indicative of new stroke  -MRI contradicated given ICD  -stroke workup including carotid dopplers, 2D ECHO, risk stratification labs, CTA  -f/u on neuro recs   2-R fibular fracture -splint in place  -Dr handy with ortho consulted by EDP in [place] -pain control -f/u ortho recs   3-UTI -rocephin   -urine culture   4-Afib/NICM/hx/o DVT  -noted cardiomegaly and pulm vascular congestion on imaginf -fairly euvolemic on exam currently -Check pro BNP to coorelate  -Continue home regimen in the interim -Cards c/s as clinically indicated -noted supratherapeutic INR?-? Excessive dosing of xarelto (discussed with pharmacy) -change to renal dosing  -will repeat-if true, may need to hold xarelto-discuss with cards   5-Anemia  -Noted iron deficiency anemia requiring transfusion from last admission -Hgb stable @ 9.2 (discharge hgb 9.2).  -No overt signs of bleeding  -Continue to follow.   6-IDDM -SSI, A1C  FEN/GI: NPO  Prophylaxis: xarelto  Disposition: pending further evaluation  Code Status:Full Code   Patient Active Problem List   Diagnosis Date Noted  . CVA (cerebral infarction) 04/15/2014  . UTI (lower urinary tract infection) 04/14/2014  . Acute on chronic respiratory failure 03/21/2014  . Systolic CHF, acute on chronic 03/21/2014  . Acute encephalopathy 03/21/2014  . Seizures 03/21/2014  . Headache 08/31/2013  . Chest pain 08/05/2013  . Acute combined systolic and diastolic heart failure 07/21/2013  . Acute respiratory failure 07/21/2013  . NICM (nonischemic cardiomyopathy)   . History of DVT (deep vein thrombosis)   . History of stroke with residual effects   . Hypertension   . Asthma   . Renal disorder   . Chronic systolic CHF (congestive heart failure)   . Atrial fibrillation   . VT (ventricular tachycardia)   . 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 DM type 2 (diabetes mellitus, type 2) 12/02/2012  . Noncompliance 09/19/2012  . Chronic pain  09/19/2012  . Protein C deficiency 08/25/2012  . Hypokalemia 08/24/2012  . Seizure disorder 08/04/2012  . Thrombocytopenia 07/19/2012  . Hypothyroidism 07/18/2012  . Aphasia as late effect of cerebrovascular accident 04/23/2012  . Narcotic abuse 04/23/2012   Past Medical History: Past  Medical History  Diagnosis Date  . Seizures   . Hypertension   . Stroke     2007 - R sided weakness and expressive aphasia with h/o behavioral problems related to this  . Asthma   . Diabetes mellitus   . Chronic systolic CHF (congestive heart failure)   . Atrial fibrillation     a. On Xarelto after having DVT (prev felt to be poor anticoag cand 2/2 noncompliance).  . History of DVT (deep vein thrombosis)     a. 06/2012: RLE DVT, + protein C deficiency, placed on Xarelto. s/p IVC filter  . Noncompliance     Due to mental status, family has had to coax her to take medicines  . Hypothyroidism   . VT (ventricular tachycardia)     a. h/o ICD ~2007-2008. b. Adm 08/2012 with UTI, had VT/ICD shock x 5 (hypokalemic);  c. 06/2013 s/p ICD Gen change: SJM DC ICD ser #: [id number]  . Protein C deficiency   . NICM (nonischemic cardiomyopathy)     a. s/p AICD ~2008 (St. Jude) - previous care [place]. No known hx CAD. b. EF 10% by echo 04/2012;  c. 06/2013 s/p ICD Gen change: SJM DC ICD ser #: [id number]  . 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 Panic attacks   . Anxiety     Past Surgical History: Past Surgical History  Procedure Laterality Date  . Cholecystectomy    . Appendectomy    . Pacemaker insertion      [employer] Judes  . Vena cava filter placement  07/19/2012    Procedure: INSERTION VENA-CAVA FILTER;  Surgeon: Larina Earthly, MD;  Location: [facility] OR;  Service: Vascular;  Laterality: [name];  . Insert / replace / remove pacemaker  2014    ICD. St. Jude; inserted 2008 [facility]); gen change 2014 SK  . Abdominal hysterectomy      Social History: History   Social History  . Marital Status: Single    Spouse Name: [name]    Number of Children: [name]  . Years of Education: [name]   Occupational History  . Not employed    Social History Main Topics  . Smoking status: Former Games developer  . Smokeless tobacco: Never Used  . Alcohol Use: No  . Drug Use: No  . Sexual Activity: No   Other Topics Concern   . None   Social History Narrative   ** Merged History Encounter **   Pt lives with her brother.        Family History: Family History  Problem Relation Age of Onset  . Hypertension Mother   . Coronary artery disease Neg Hx     Allergies: Allergies  Allergen Reactions  . Penicillins Rash  . Sulfa Antibiotics Rash    Current Facility-Administered Medications  Medication Dose Route Frequency Provider Last Rate Last Dose  .  stroke: mapping our early stages of recovery book   Does not apply Once Doree Albee, MD      . acetaminophen (TYLENOL) tablet 650 mg  650 mg Oral Once Junius Argyle, MD      . acetaminophen (TYLENOL) tablet 650 mg  650 mg Oral Q6H PRN Doree Albee, MD      .  albuterol (PROVENTIL) (2.5 MG/3ML) 0.083% nebulizer solution 2.5 mg  2.5 mg Nebulization Q6H PRN Doree Albee, MD      . ALPRAZolam Prudy Feeler) tablet 1 mg  1 mg Oral QHS PRN Doree Albee, MD      . amiodarone (PACERONE) tablet 200 mg  200 mg Oral Daily Doree Albee, MD      . carvedilol (COREG) tablet 3.125 mg  3.125 mg Oral BID WC Doree Albee, MD      . digoxin Margit Banda) tablet 0.125 mg  0.125 mg Oral Daily Doree Albee, MD      . escitalopram (LEXAPRO) tablet 20 mg  20 mg Oral Daily Doree Albee, MD      . furosemide (LASIX) tablet 40 mg  40 mg Oral BID Doree Albee, MD      . iron polysaccharides (NIFEREX) capsule 150 mg  150 mg Oral Daily Doree Albee, MD      . levETIRAcetam (KEPPRA) tablet 2,000 mg  2,000 mg Oral BID Doree Albee, MD      . levothyroxine (SYNTHROID, LEVOTHROID) tablet 50 mcg  50 mcg Oral QAC breakfast Doree Albee, MD      . lisinopril (PRINIVIL,ZESTRIL) tablet 2.5 mg  2.5 mg Oral Daily Doree Albee, MD      . metolazone (ZAROXOLYN) tablet 2.5 mg  2.5 mg Oral Daily Doree Albee, MD      . oxyCODONE-acetaminophen (PERCOCET) 10-325 MG per tablet 1 tablet  1 tablet Oral Q8H PRN Doree Albee, MD      . pantoprazole (PROTONIX) EC tablet 40 mg  40 mg Oral Daily Doree Albee, MD      . polyethylene glycol (MIRALAX / GLYCOLAX) packet 17 g  17 g Oral Daily PRN Doree Albee, MD      . potassium chloride 20 MEQ/15ML (10%) liquid 20 mEq  20 mEq Oral Daily Doree Albee, MD      . QUEtiapine (SEROQUEL XR) 24 hr tablet 150 mg  150 mg Oral QHS Doree Albee, MD      . rivaroxaban Carlena Hurl) tablet 20 mg  20 mg Oral Daily Doree Albee, MD      . senna-docusate (Senokot-S) tablet 1 tablet  1 tablet Oral QHS PRN Doree Albee, MD      . Vitamin D (Ergocalciferol) (DRISDOL) capsule 50,000 Units  50,000 Units Oral Q30 days Doree Albee, MD       Review Of Systems: 12 point ROS negative except as noted above in HPI.  Physical Exam: Filed Vitals:   04/14/14 2341  BP: 106/62  Pulse: 76  Temp: 98 F (36.7 C)  Resp: 18    General: aphasic, mildly confused, tearful "i want my xanax" HEENT: PERRLA and extra ocular movement intact Heart: S1, S2 normal, no murmur, rub or gallop, regular rate and rhythm Lungs: clear to auscultation, no wheezes or rales and unlabored breathing Abdomen: abdomen is soft without significant tenderness, masses, organomegaly or guarding Extremities: R sided hemiparesis, RLE w/ splint in place, 2+ peripheral pulsed  Skin:no rashes, no ecchymoses Neurology: normal without focal findings  Labs and Imaging: Lab Results  Component Value Date/Time   NA 145 04/14/2014  5:32 PM   K 3.6* 04/14/2014  5:32 PM   CL 102 04/14/2014  5:32 PM   CO2 29 04/14/2014  5:32 PM   BUN 15 04/14/2014  5:32 PM   CREATININE 1.20* 04/14/2014  5:32 PM   GLUCOSE 114* 04/14/2014  5:32 PM   Lab Results  Component Value Date   WBC  5.7 04/14/2014   HGB 9.2* 04/14/2014   HCT 32.3* 04/14/2014   MCV 79.0 04/14/2014   PLT 250 04/14/2014   Urinalysis    Component Value Date/Time   COLORURINE YELLOW 04/14/2014 1811   APPEARANCEUR CLEAR 04/14/2014 1811   LABSPEC 1.009 04/14/2014 1811   PHURINE 6.5 04/14/2014 1811   GLUCOSEU NEGATIVE 04/14/2014 1811   HGBUR MODERATE* 04/14/2014  1811   BILIRUBINUR NEGATIVE 04/14/2014 1811   KETONESUR NEGATIVE 04/14/2014 1811   PROTEINUR NEGATIVE 04/14/2014 1811   UROBILINOGEN 4.0* 04/14/2014 1811   NITRITE NEGATIVE 04/14/2014 1811   LEUKOCYTESUR MODERATE* 04/14/2014 1811       Dg Chest 1 View  04/14/2014   CLINICAL DATA:  Fall  EXAM: CHEST - 1 VIEW  COMPARISON:  Prior radiograph from 03/21/2014  FINDINGS: Left-sided pacemaker/AICD is unchanged. Severe cardiomegaly grossly stable. Mediastinal silhouette within normal limits.  The lungs are normally inflated. Mild diffuse pulmonary vascular congestion without frank pulmonary edema. No definite pleural effusion. No focal infiltrate. No pneumothorax.  No acute osseous abnormality identified.  IMPRESSION: Severe cardiomegaly with mild diffuse pulmonary vascular congestion without overt pulmonary edema. No other acute cardiopulmonary abnormality.   Electronically Signed   By: Rise Mu Love.D.   On: 04/14/2014 19:29   Dg Hip Complete Right  04/14/2014   CLINICAL DATA:  Diffuse right lower extremity pain secondary to a fall from the bed 3 days ago.  EXAM: RIGHT HIP - COMPLETE 2+ VIEW  COMPARISON:  None.  FINDINGS: There is no evidence of hip fracture or dislocation. There is no evidence of arthropathy or other focal bone abnormality.  IMPRESSION: No osseous abnormality.   Electronically Signed   By: Geanie Cooley Love.D.   On: 04/14/2014 19:38   Dg Knee 2 Views Right  04/14/2014   CLINICAL DATA:  Fall from bed.  Diffuse right lower extremity pain.  EXAM: RIGHT KNEE - 1-2 VIEW  COMPARISON:  07/18/2012  FINDINGS: Diffuse bony demineralization. Poor definition of fat plane surround knee make today's exam indeterminate for knee effusion. Suspected diffuse subcutaneous edema. No cortical discontinuity identified to suggest fracture.  IMPRESSION: 1. Diffuse bony demineralization. 2. Suspected subcutaneous edema. Indeterminate exam for knee effusion.   Electronically Signed   By: Herbie Baltimore Love.D.    On: 04/14/2014 19:36   Dg Tibia/fibula Right  04/14/2014   CLINICAL DATA:  Fall.  EXAM: RIGHT TIBIA AND FIBULA - 2 VIEW  COMPARISON:  None.  FINDINGS: Slightly angulated fracture of the distal right fibular shaft is present. Tibia is intact.  IMPRESSION: Slightly angulated fracture of the distal right fibular shaft.   Electronically Signed   By: Maisie Fus  Register   On: 04/14/2014 19:36   Dg Ankle 2 Views Right  04/14/2014   CLINICAL DATA:  Fall  EXAM: RIGHT ANKLE - 2 VIEW  COMPARISON:  None.  FINDINGS: There is an acute transverse fracture through the distal right fibular shaft. Ankle mortise is grossly approximated. No other definite fracture.  Mild diffuse soft tissue swelling present left ankle.  Diffuse osteopenia present.  IMPRESSION: Acute nondisplaced transverse fracture of the distal right fibular shaft.   Electronically Signed   By: Rise Mu Love.D.   On: 04/14/2014 19:35   Ct Head Wo Contrast  04/14/2014   CLINICAL DATA:  Fall. Head injury. Prior stroke, nonverbal patient.  EXAM: CT HEAD WITHOUT CONTRAST  TECHNIQUE: Contiguous axial images were obtained from the base of the skull through the vertex without intravenous contrast.  COMPARISON:  Multiple exams, including 03/21/2014  FINDINGS: I am unable to locate the report from the 03/21/2014 exam. I have Engineer, production.  Large prior left MCA distribution stroke noted with associated wall Peyton Najjar and degeneration causing a small left cerebral peduncle. Stroke extends up into the white matter the vertex, with some medial involvement of the left frontal lobe as shown on image 25 of series 2, stable.  The large region of involvement appears similar to the prior exam.  Reduced gray-white differentiation inferiorly in the right temporal lobe. This is less striking on series 4 is compared to series 2 and may partially be due to motion artifact and streak artifact, but appears asymmetric enough to potentially represent a  early stroke.  No intracranial hemorrhage or mass lesion identified.  IMPRESSION: 1. Hypodensity and poor gray-white differentiation inferiorly in the right temporal lobe such that an acute CVA involving the right temporal lobe cannot be readily excluded. This is in area which can be subject to artifact simulating a stroke. Consider MRI for further characterization. 2. Old large left MCA distribution stroke.  These results were called by telephone at the time of interpretation on 04/14/2014 at 7:31 pm to Dr. Randa Spike, Romeo Apple , who verbally acknowledged these results.   Electronically Signed   By: Herbie Baltimore Love.D.   On: 04/14/2014 19:34   Dg Foot 2 Views Right  04/14/2014   CLINICAL DATA:  Fall.  EXAM: RIGHT FOOT - 2 VIEW  COMPARISON:  None.  FINDINGS: Diffuse osteopenia and degenerative change. No acute bony or joint abnormality identified.  IMPRESSION: No acute bony or joint abnormality. Diffuse osteopenia and degenerative change.   Electronically Signed   By: Maisie Fus  Register   On: 04/14/2014 19:34           Doree Albee MD  Pager: [phone]

## 2014-04-16 ENCOUNTER — Inpatient Hospital Stay (HOSPITAL_COMMUNITY): Payer: Medicare Other

## 2014-04-16 DIAGNOSIS — I6990 Unspecified sequelae of unspecified cerebrovascular disease: Secondary | ICD-10-CM

## 2014-04-16 DIAGNOSIS — S82409A Unspecified fracture of shaft of unspecified fibula, initial encounter for closed fracture: Secondary | ICD-10-CM

## 2014-04-16 DIAGNOSIS — S82843A Displaced bimalleolar fracture of unspecified lower leg, initial encounter for closed fracture: Principal | ICD-10-CM

## 2014-04-16 DIAGNOSIS — W19XXXA Unspecified fall, initial encounter: Secondary | ICD-10-CM

## 2014-04-16 LAB — GLUCOSE, CAPILLARY
GLUCOSE-CAPILLARY: 109 mg/dL — AB (ref 70–99)
Glucose-Capillary: 101 mg/dL — ABNORMAL HIGH (ref 70–99)
Glucose-Capillary: 104 mg/dL — ABNORMAL HIGH (ref 70–99)
Glucose-Capillary: 108 mg/dL — ABNORMAL HIGH (ref 70–99)
Glucose-Capillary: 113 mg/dL — ABNORMAL HIGH (ref 70–99)
Glucose-Capillary: 119 mg/dL — ABNORMAL HIGH (ref 70–99)
Glucose-Capillary: 126 mg/dL — ABNORMAL HIGH (ref 70–99)

## 2014-04-16 MED ORDER — AMIODARONE HCL 200 MG PO TABS
200.0000 mg | ORAL_TABLET | ORAL | Status: DC
  Filled 2014-04-16: qty 1

## 2014-04-16 MED ORDER — CARVEDILOL 3.125 MG PO TABS
3.1250 mg | ORAL_TABLET | Freq: Two times a day (BID) | ORAL | Status: DC
  Filled 2014-04-16 (×3): qty 1

## 2014-04-16 NOTE — Progress Notes (Signed)
Orthopaedic Trauma Service Progress Note  Subjective  No interval changes Ortho issues stable  ROS  Objective   BP 92/59  Pulse 72  Temp(Src) 98.4 F (36.9 C) (Oral)  Resp 18  Ht 5' (1.524 m)  Wt 53.162 kg (117 lb 3.2 oz)  BMI 22.89 kg/m2  SpO2 94%  Intake/Output     07/29 0701 - 07/30 0700 07/30 0701 - 07/31 0700   P.O. 240 0   Total Intake(mL/kg) 240 (4.5)    Net +240 0        Urine Occurrence 5 x 1 x   Stool Occurrence 2 x      Exam  Gen: NAD Ext:      Right Lower Extremity   Splint still in place   No change in exam otherwise   Skin stable     Assessment and Plan   POD/HD#: 12   58 year old female with numerous medical comorbidities status post ground level fall with right bimalleolar ankle fracture  1. Fall  2. Right bimalleolar ankle fracture- SER-4 type pattern  Will have pt placed into [facility] before discharge  Strongly recommend bed to chair transfers only  NWB R Lex x 8 weeks   Ice and elevate   Follow up xrays show some medial clear space widening but this could be secondary to loss of muscle tone due to stroke. Do not think this forces our hand to operate.  Will watch closely               3. continue per medicine service             follow up with ortho in 2 weeks   Mearl Latin, PA-C [facility] [phone] (P) 04/16/2014 1:44 PM  **Disclaimer: This note may have been dictated with voice recognition software. Similar sounding words can inadvertently be transcribed and this note may contain transcription errors which may not have been corrected upon publication of note.**

## 2014-04-16 NOTE — Discharge Instructions (Addendum)
Orthopaedic Trauma Service Discharge Instructions   General Discharge Instructions  WEIGHT BEARING STATUS: Nonweight bearing R leg.  Would recommend bed to chair transfers only.  Do not try to ambulate with walker   RANGE OF MOTION/ACTIVITY: no ankle range of motion at this time   Diet: as you were eating previously.  Can use over the counter stool softeners and bowel preparations, such as Miralax, to help with bowel movements.  Narcotics can be constipating.  Be sure to drink plenty of fluids  STOP SMOKING OR USING NICOTINE PRODUCTS!!!!  As discussed nicotine severely impairs your body's ability to heal surgical and traumatic wounds but also impairs bone healing.  Wounds and bone heal by forming microscopic blood vessels (angiogenesis) and nicotine is a vasoconstrictor (essentially, shrinks blood vessels).  Therefore, if vasoconstriction occurs to these microscopic blood vessels they essentially disappear and are unable to deliver necessary nutrients to the healing tissue.  This is one modifiable factor that you can do to dramatically increase your chances of healing your injury.    (This means no smoking, no nicotine gum, patches, etc)  DO NOT USE NONSTEROIDAL ANTI-INFLAMMATORY DRUGS (NSAID'S)  Using products such as Advil (ibuprofen), Aleve (naproxen), Motrin (ibuprofen) for additional pain control during fracture healing can delay and/or prevent the healing response.  If you would like to take over the counter (OTC) medication, Tylenol (acetaminophen) is ok.  However, some narcotic medications that are given for pain control contain acetaminophen as well. Therefore, you should not exceed more than 4000 mg of tylenol in a day if you do not have liver disease.  Also note that there are may OTC medicines, such as cold medicines and allergy medicines that my contain tylenol as well.  If you have any questions about medications and/or interactions please ask your doctor/PA or your pharmacist.   PAIN  MEDICATION USE AND EXPECTATIONS  You have likely been given narcotic medications to help control your pain.  After a traumatic event that results in an fracture (broken bone) with or without surgery, it is ok to use narcotic pain medications to help control one's pain.  We understand that everyone responds to pain differently and each individual patient will be evaluated on a regular basis for the continued need for narcotic medications. Ideally, narcotic medication use should last no more than 6-8 weeks (coinciding with fracture healing).   As a patient it is your responsibility as well to monitor narcotic medication use and report the amount and frequency you use these medications when you come to your office visit.   We would also advise that if you are using narcotic medications, you should take a dose prior to therapy to maximize you participation.  IF YOU ARE ON NARCOTIC MEDICATIONS IT IS NOT PERMISSIBLE TO OPERATE A MOTOR VEHICLE (MOTORCYCLE/CAR/TRUCK/MOPED) OR HEAVY MACHINERY DO NOT MIX NARCOTICS WITH OTHER CNS (CENTRAL NERVOUS SYSTEM) DEPRESSANTS SUCH AS ALCOHOL       ICE AND ELEVATE INJURED/OPERATIVE EXTREMITY  Using ice and elevating the injured extremity above your heart can help with swelling and pain control.  Icing in a pulsatile fashion, such as 20 minutes on and 20 minutes off, can be followed.    Do not place ice directly on skin. Make sure there is a barrier between to skin and the ice pack.    Using frozen items such as frozen peas works well as the conform nicely to the are that needs to be iced.  USE AN ACE WRAP OR TED HOSE FOR  SWELLING CONTROL  In addition to icing and elevation, Ace wraps or TED hose are used to help limit and resolve swelling.  It is recommended to use Ace wraps or TED hose until you are informed to stop.    When using Ace Wraps start the wrapping distally (farthest away from the body) and wrap proximally (closer to the body)   Example: If you had surgery  on your leg or thing and you do not have a splint on, start the ace wrap at the toes and work your way up to the thigh        If you had surgery on your upper extremity and do not have a splint on, start the ace wrap at your fingers and work your way up to the upper arm  IF YOU ARE IN A SPLINT OR CAST DO NOT REMOVE IT FOR ANY REASON   If your splint gets wet for any reason please contact the office immediately. You may shower in your splint or cast as long as you keep it dry.  This can be done by wrapping in a cast cover or garbage back (or similar)  Do Not stick any thing down your splint or cast such as pencils, money, or hangers to try and scratch yourself with.  If you feel itchy take benadryl as prescribed on the bottle for itching         Information on my medicine - XARELTO (rivaroxaban)  This medication education was reviewed with me or my healthcare representative as part of my discharge preparation.  The pharmacist that spoke with me during my hospital stay was:  Pasty Spillers, [facility]  WHY WAS Elizabeth Love PRESCRIBED FOR YOU? Xarelto was prescribed to treat blood clots that may have been found in the veins of your legs (deep vein thrombosis) or in your lungs (pulmonary embolism) and to reduce the risk of them occurring again.  What do you need to know about Xarelto? The dose is one 15 mg tablet taken ONCE A DAY with your evening meal.  DO NOT stop taking Xarelto without talking to the health care provider who prescribed the medication.  Refill your prescription for 20 mg tablets before you run out.  After discharge, you should have regular check-up appointments with your healthcare provider that is prescribing your Xarelto.  In the future your dose may need to be changed if your kidney function changes by a significant amount.  What do you do if you miss a dose? If you are taking Xarelto TWICE DAILY and you miss a dose, take it as soon as you remember. You may  take two 15 mg tablets (total 30 mg) at the same time then resume your regularly scheduled 15 mg twice daily the next day.  If you are taking Xarelto ONCE DAILY and you miss a dose, take it as soon as you remember on the same day then continue your regularly scheduled once daily regimen the next day. Do not take two doses of Xarelto at the same time.   Important Safety Information Xarelto is a blood thinner medicine that can cause bleeding. You should call your healthcare provider right away if you experience any of the following:   Bleeding from an injury or your nose that does not stop.   Unusual colored urine (red or dark brown) or unusual colored stools (red or black).   Unusual bruising for unknown reasons.   A serious fall or if you hit your head (even if  there is no bleeding).  Some medicines may interact with Xarelto and might increase your risk of bleeding while on Xarelto. To help avoid this, consult your healthcare provider or pharmacist prior to using any new prescription or non-prescription medications, including herbals, vitamins, non-steroidal anti-inflammatory drugs (NSAIDs) and supplements.  This website has more information on Xarelto: [email or web address].[email or web address]

## 2014-04-16 NOTE — Progress Notes (Signed)
Orthopedic Tech Progress Note Patient Details:  Elizabeth Love [birth year] [id number]  Casting Type of Cast: Short leg cast Cast Material: Fiberglass     Cammer, Mickie Bail 04/16/2014, 2:25 PM

## 2014-04-16 NOTE — Progress Notes (Signed)
Speech Language Pathology Treatment: Cognitive-Linquistic  Patient Details Name: Elizabeth Love MRN: [id number] DOB: [birth year] Today's Date: 04/16/2014 Time: 3664-4034 SLP Time Calculation (min): 17 min  Assessment / Plan / Recommendation Clinical Impression  Brief treatment session, due to lethargy. Pt able to indicate via gestures that she was finished with breakfast, but wanted to keep her orange juice. In response to "how are you today?" pt responded with shoulder shrug. No verbalizations were elicited, but vocalization revealed clear voice quality (not wet or gurgly).  Poor po intake for breakfast noted. SLP attempted to provide juice to pt, but pt unable to maintain alertness for po trial.  ST to continue treatment for improving effective communication and assessment of diet tolerance.    HPI HPI: Pt is 58 y.o. 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 Admitted 7/4-7/7 with acute on chronic systolic heart failure, abd pain w/ anemia. Fell off bed 7/26, rolling ankle. CXR showed severe cardiomegaly w/ pulm vascular congestion. Head CT showed changes over R temporal lobe concerning for CVA. Also presenting with UTI. Pt initially passed stroke swallow screen but then made NPO d/t concerns with current cognitive status affecting swallow function; SLP order to further evaluate swallow function.   Pertinent Vitals VSS  SLP Plan  Continue with current plan of care    Recommendations                Oral Care Recommendations: Oral care BID Follow up Recommendations: 24 hour supervision/assistance Plan: Continue with current plan of care    GO   Shanika Levings B. Murvin [facility], CCC-SLP [phone]  Leigh Aurora 04/16/2014, 9:58 AM

## 2014-04-16 NOTE — Progress Notes (Addendum)
CSW (Clinical Child psychotherapist) continues to try to assist with dc plan. CSW left voicemail for pt son Elizabeth Love. CSW was able to speak with pt brother Elizabeth Love who confirmed pt lives with him. However, Elizabeth Love informed CSW he would not be able to make decision on dc plan and CSW would need to speak with pt son. CSW confirmed number listed in chart for Elizabeth Love is correct and asked for Elizabeth Love to inform Elizabeth Love to call CSW back to discuss SNF.   ADDENDUM 12:45pm: CSW received call back from pt son Elizabeth Love. Elizabeth Love does not believe pt will participate or do well in SNF environment and he would prefer she return home and resume home health services. Pt son confident himself and his uncle can manage pt care as they were doing so prior to admission. CSW notified RNCM of conversation. At this time, pt has no hospital social work needs. Please consult should new needs arise.   Elizabeth Love, LCSWA [phone]

## 2014-04-16 NOTE — Progress Notes (Signed)
PT Cancellation Note  Patient Details Name: Elizabeth Love MRN: [id number] DOB: 12/18/1955   Cancelled Treatment:    Reason Eval/Treat Not Completed: Patient's level of consciousness. Pt unable to be aroused in afternoon for PT session. Will continue to follow.    Ruthann Cancer 04/16/2014, 5:06 PM  Ruthann Cancer, PT, DPT Acute Rehabilitation Services Pager: [phone]

## 2014-04-16 NOTE — Progress Notes (Signed)
[facility] PROGRESS NOTE  Elizabeth Love TIW:[id number] DOB: Jul 24, 1956 DOA: 04/14/2014 PCP: Lemont Fillers., NP  Assessment/Plan: 1. Right displaced fracture of distal fibula -Patient seen and evaluated by orthopedic surgery, recommending nonoperative management for now. -Ortho also recommending not weightbearing for the next 8 weeks, although there is concern for this given patient's history of noncompliance. -Physical therapy recommending SNF placement, family however feeling comfortable taking her home. -Discussed case with her brother who would prefer for patient to be discharged home rather than going to SNF -Ortho recommending patient to be placed into [facility] prior to discharge  2. History of CVA -Patient appears be functioning at her baseline. -Initial CT scan of the brain showed hypodensity which could reflect acute CVA, neurology consulted, repeat CT scan of brain performed on 04/15/2014 showing no acute intracranial abnormalities and again demonstrating old CVA infarct. -Patient remains neurologically stable, findings on initial CT scan likely artifactual.   3. Urinary tract infection. -Continue empiric IV antibiotic therapy with ceftriaxone  4. Atrial fibrillation -Patient remains on chronic anticoagulation with Xarelto at 15 mg by mouth daily. -Pharmacy consulted for dosing -Rate controlled, continue digoxin and carvedilol  Code Status: Full Code Family Communication: I spoke with patient's brother over telephone Disposition Plan: PT recommending SNF placement, family wanting her to be at home with [facility] services    Consultants:  Neurology  Orthopedic surgery  Antibiotics:  Ceftriaxone  HPI/Subjective: Patient is a 58 year old female with a past medical history of CVA with residual right-sided hemiparesis, aphasia, admitted to the medicine service on 04/15/2014, after falling of the edge of bed several days prior to presentation. She initially went to [facility]  where imaging studies revealed a nondisplaced distal fibular shaft fracture. A head CT was done on admission which revealed hypodensity and poor gray white differentiation in fairly in the right temporal lobe that raised a question of acute CVA. Neurology was consulted as it was felt this likely represented an artifact. A repeat CT scan of the brain performed on 04/15/2014 showed a large territory chronic left MCA infarct, without acute changes.  Objective: Filed Vitals:   04/16/14 0638  BP: 92/59  Pulse: 72  Temp: 98.4 F (36.9 C)  Resp: 18    Intake/Output Summary (Last 24 hours) at 04/16/14 1557 Last data filed at 04/16/14 0815  Gross per 24 hour  Intake    120 ml  Output      0 ml  Net    120 ml   Filed Weights   04/14/14 2341 04/15/14 0421 04/16/14 2505  Weight: 52.663 kg (116 lb 1.6 oz) 52.799 kg (116 lb 6.4 oz) 53.162 kg (117 lb 3.2 oz)    Exam:   General:  Poor historian, in no acute distress, minimally verbal for me today  Cardiovascular: Regular rate rhythm normal S1-S2  Respiratory: Clear to auscultation bilaterally  Abdomen: Soft nontender not  Musculoskeletal: Right ankle wrapped, patient reporting pain with movement 2 right extremity  Data Reviewed: Basic Metabolic Panel:  Recent Labs Lab 04/14/14 1732 04/15/14 0135  NA 145  --   K 3.6*  --   CL 102  --   CO2 29  --   GLUCOSE 114*  --   BUN 15  --   CREATININE 1.20* 0.96  CALCIUM 9.7  --    Liver Function Tests:  Recent Labs Lab 04/14/14 1732  AST 37  ALT 17  ALKPHOS 65  BILITOT 2.0*  PROT 8.3  ALBUMIN 3.1*   No results  found for this basename: LIPASE, AMYLASE,  in the last 168 hours No results found for this basename: AMMONIA,  in the last 168 hours CBC:  Recent Labs Lab 04/14/14 1732 04/15/14 0135  WBC 5.7 5.9  NEUTROABS 3.9  --   HGB 9.2* 8.6*  HCT 32.3* 30.4*  MCV 79.0 78.6  PLT 250 245   Cardiac Enzymes: No results found for this basename: CKTOTAL, CKMB, CKMBINDEX,  TROPONINI,  in the last 168 hours BNP (last 3 results)  Recent Labs  01/10/14 0912 03/21/14 0633 04/15/14 0135  PROBNP 7500.0* 11633.0* 10371.0*   CBG:  Recent Labs Lab 04/15/14 2108 04/16/14 0108 04/16/14 0518 04/16/14 0757 04/16/14 1249  GLUCAP 168* 119* 126* 109* 113*    Recent Results (from the past 240 hour(s))  URINE CULTURE     Status: None   Collection Time    04/14/14  8:11 PM      Result Value Ref Range Status   Specimen Description URINE, CLEAN CATCH   Final   Special Requests NONE   Final   Culture  Setup Time     Final   Value: 04/15/2014 00:16     Performed at [facility]   Colony Count     Final   Value: >=100,000 COLONIES/ML     Performed at [facility]   Culture     Final   Value: ESCHERICHIA COLI     Performed at [facility]   Report Status PENDING   Incomplete     Studies: Dg Chest 1 View  04/14/2014   CLINICAL DATA:  Fall  EXAM: CHEST - 1 VIEW  COMPARISON:  Prior radiograph from 03/21/2014  FINDINGS: Left-sided pacemaker/AICD is unchanged. Severe cardiomegaly grossly stable. Mediastinal silhouette within normal limits.  The lungs are normally inflated. Mild diffuse pulmonary vascular congestion without frank pulmonary edema. No definite pleural effusion. No focal infiltrate. No pneumothorax.  No acute osseous abnormality identified.  IMPRESSION: Severe cardiomegaly with mild diffuse pulmonary vascular congestion without overt pulmonary edema. No other acute cardiopulmonary abnormality.   Electronically Signed   By: Rise MuBenjamin  McClintock M.D.   On: 04/14/2014 19:29   Dg Chest 2 View  04/15/2014   CLINICAL DATA:  Stroke, weakness, fall  EXAM: CHEST  2 VIEW  COMPARISON:  04/14/2014  FINDINGS: Cardiomegaly again noted. Central vascular congestion without convincing pulmonary edema. Dual lead cardiac pacemaker is unchanged in position. There is bilateral basilar atelectasis or scarring.  IMPRESSION: Cardiomegaly. Central vascular  congestion without convincing pulmonary edema. Bilateral basilar atelectasis or scarring.   Electronically Signed   By: Natasha MeadLiviu  Pop M.D.   On: 04/15/2014 12:52   Dg Hip Complete Right  04/14/2014   CLINICAL DATA:  Diffuse right lower extremity pain secondary to a fall from the bed 3 days ago.  EXAM: RIGHT HIP - COMPLETE 2+ VIEW  COMPARISON:  None.  FINDINGS: There is no evidence of hip fracture or dislocation. There is no evidence of arthropathy or other focal bone abnormality.  IMPRESSION: No osseous abnormality.   Electronically Signed   By: Geanie CooleyJim  Maxwell M.D.   On: 04/14/2014 19:38   Dg Knee 2 Views Right  04/14/2014   CLINICAL DATA:  Fall from bed.  Diffuse right lower extremity pain.  EXAM: RIGHT KNEE - 1-2 VIEW  COMPARISON:  07/18/2012  FINDINGS: Diffuse bony demineralization. Poor definition of fat plane surround knee make today's exam indeterminate for knee effusion. Suspected diffuse subcutaneous edema. No cortical discontinuity identified to  suggest fracture.  IMPRESSION: 1. Diffuse bony demineralization. 2. Suspected subcutaneous edema. Indeterminate exam for knee effusion.   Electronically Signed   By: Herbie Baltimore M.D.   On: 04/14/2014 19:36   Dg Tibia/fibula Right  04/14/2014   CLINICAL DATA:  Fall.  EXAM: RIGHT TIBIA AND FIBULA - 2 VIEW  COMPARISON:  None.  FINDINGS: Slightly angulated fracture of the distal right fibular shaft is present. Tibia is intact.  IMPRESSION: Slightly angulated fracture of the distal right fibular shaft.   Electronically Signed   By: Maisie Fus  Register   On: 04/14/2014 19:36   Dg Ankle 2 Views Right  04/16/2014   CLINICAL DATA:  Followup post casting. Patient declined to finish the exam after the lateral view.  EXAM: RIGHT ANKLE - 2 VIEW  COMPARISON:  04/15/2014  FINDINGS: Single lateral views performed, showing casting material overlying the ankle which significantly limits detail. There has been no appreciable change in alignment as demonstrated on the lateral  view. There is soft tissue swelling involving the foot.  IMPRESSION: 1. Study is limited by the number views obtained. 2. No significant change in alignment demonstrated by the lateral view.   Electronically Signed   By: Rosalie Gums M.D.   On: 04/16/2014 14:01   Dg Ankle 2 Views Right  04/14/2014   CLINICAL DATA:  Fall  EXAM: RIGHT ANKLE - 2 VIEW  COMPARISON:  None.  FINDINGS: There is an acute transverse fracture through the distal right fibular shaft. Ankle mortise is grossly approximated. No other definite fracture.  Mild diffuse soft tissue swelling present left ankle.  Diffuse osteopenia present.  IMPRESSION: Acute nondisplaced transverse fracture of the distal right fibular shaft.   Electronically Signed   By: Rise Mu M.D.   On: 04/14/2014 19:35   Dg Ankle Complete Right  04/15/2014   CLINICAL DATA:  Followup fracture  EXAM: RIGHT ANKLE - COMPLETE 3+ VIEW  COMPARISON:  04/14/2014  FINDINGS: Study is limited by osteopenia. There is casting material artifact. Again noted oblique minimal displaced fracture in distal fibula. Question mild disruption of ankle mortise with mild widening of medial tibiotalar space.  IMPRESSION: Again noted to oblique minimal displaced fracture of distal fibula. Question mild disruption of ankle mortise with widening of medial tibiotalar joint space.   Electronically Signed   By: Natasha Mead M.D.   On: 04/15/2014 12:56   Ct Head Wo Contrast  04/15/2014   CLINICAL DATA:  Question right temporal stroke  EXAM: CT HEAD WITHOUT CONTRAST  TECHNIQUE: Contiguous axial images were obtained from the base of the skull through the vertex without intravenous contrast.  COMPARISON:  CT 04/14/2014  FINDINGS: Large territory chronic infarct in the left middle cerebral artery territory involving the frontal temporal and parietal lobes as well as the left basal ganglia.  Negative for acute infarct. Right temporal lobe area appears normal on the current study. Artifact is present on  the prior study. Negative for hemorrhage or mass.  IMPRESSION: Large territory chronic left MCA infarct.  No acute abnormality.   Electronically Signed   By: Marlan Palau M.D.   On: 04/15/2014 09:28   Ct Head Wo Contrast  04/14/2014   CLINICAL DATA:  Fall. Head injury. Prior stroke, nonverbal patient.  EXAM: CT HEAD WITHOUT CONTRAST  TECHNIQUE: Contiguous axial images were obtained from the base of the skull through the vertex without intravenous contrast.  COMPARISON:  Multiple exams, including 03/21/2014  FINDINGS: I am unable to locate the report from  the 03/21/2014 exam. I have Engineer, production.  Large prior left MCA distribution stroke noted with associated wall Peyton Najjar and degeneration causing a small left cerebral peduncle. Stroke extends up into the white matter the vertex, with some medial involvement of the left frontal lobe as shown on image 25 of series 2, stable.  The large region of involvement appears similar to the prior exam.  Reduced gray-white differentiation inferiorly in the right temporal lobe. This is less striking on series 4 is compared to series 2 and may partially be due to motion artifact and streak artifact, but appears asymmetric enough to potentially represent a early stroke.  No intracranial hemorrhage or mass lesion identified.  IMPRESSION: 1. Hypodensity and poor gray-white differentiation inferiorly in the right temporal lobe such that an acute CVA involving the right temporal lobe cannot be readily excluded. This is in area which can be subject to artifact simulating a stroke. Consider MRI for further characterization. 2. Old large left MCA distribution stroke.  These results were called by telephone at the time of interpretation on 04/14/2014 at 7:31 pm to Dr. Randa Spike, Romeo Apple , who verbally acknowledged these results.   Electronically Signed   By: Herbie Baltimore M.D.   On: 04/14/2014 19:34   Dg Foot 2 Views Right  04/14/2014   CLINICAL DATA:  Fall.   EXAM: RIGHT FOOT - 2 VIEW  COMPARISON:  None.  FINDINGS: Diffuse osteopenia and degenerative change. No acute bony or joint abnormality identified.  IMPRESSION: No acute bony or joint abnormality. Diffuse osteopenia and degenerative change.   Electronically Signed   By: Maisie Fus  Register   On: 04/14/2014 19:34    Scheduled Meds: . acetaminophen  650 mg Oral Once  . amiodarone  200 mg Oral Daily  . carvedilol  3.125 mg Oral BID WC  . cefTRIAXone (ROCEPHIN)  IV  1 g Intravenous Q24H  . digoxin  0.125 mg Oral Daily  . escitalopram  20 mg Oral Daily  . furosemide  40 mg Oral BID  . insulin aspart  0-9 Units Subcutaneous 6 times per day  . iron polysaccharides  150 mg Oral Daily  . levETIRAcetam  2,000 mg Oral Q12H  . levothyroxine  50 mcg Oral QAC breakfast  . lisinopril  2.5 mg Oral Daily  . metolazone  2.5 mg Oral Daily  . pantoprazole  40 mg Oral Daily  . potassium chloride  20 mEq Oral Daily  . QUEtiapine Fumarate  150 mg Oral QHS  . Rivaroxaban  15 mg Oral QAC supper  . [START ON 04/18/2014] Vitamin D (Ergocalciferol)  50,000 Units Oral Q30 days   Continuous Infusions:   Active Problems:   UTI (lower urinary tract infection)   CVA (cerebral infarction)    Time spent: 25 min    Jeralyn Bennett  [facility] Pager [phone]. If 7PM-7AM, please contact night-coverage at [email or web address], password [facility] 04/16/2014, 3:57 PM  LOS: 2 days

## 2014-04-17 ENCOUNTER — Encounter: Payer: Self-pay | Admitting: Internal Medicine

## 2014-04-17 ENCOUNTER — Inpatient Hospital Stay (HOSPITAL_COMMUNITY): Payer: Medicare Other

## 2014-04-17 DIAGNOSIS — G9340 Encephalopathy, unspecified: Secondary | ICD-10-CM

## 2014-04-17 LAB — URINE CULTURE

## 2014-04-17 LAB — BASIC METABOLIC PANEL
Anion gap: 11 (ref 5–15)
BUN: 14 mg/dL (ref 6–23)
CHLORIDE: 103 meq/L (ref 96–112)
CO2: 28 mEq/L (ref 19–32)
Calcium: 9 mg/dL (ref 8.4–10.5)
Creatinine, Ser: 1 mg/dL (ref 0.50–1.10)
GFR calc Af Amer: 72 mL/min — ABNORMAL LOW (ref >90)
GFR calc non Af Amer: 62 mL/min — ABNORMAL LOW (ref >90)
GLUCOSE: 95 mg/dL (ref 70–99)
Potassium: 3.2 mEq/L — ABNORMAL LOW (ref 3.7–5.3)
Sodium: 142 mEq/L (ref 137–147)

## 2014-04-17 LAB — CBC
HCT: 32 % — ABNORMAL LOW (ref 36.0–46.0)
HEMOGLOBIN: 9 g/dL — AB (ref 12.0–15.0)
MCH: 22.2 pg — AB (ref 26.0–34.0)
MCHC: 28.1 g/dL — ABNORMAL LOW (ref 30.0–36.0)
MCV: 78.8 fL (ref 78.0–100.0)
Platelets: 269 10*3/uL (ref 150–400)
RBC: 4.06 MIL/uL (ref 3.87–5.11)
RDW: 23 % — ABNORMAL HIGH (ref 11.5–15.5)
WBC: 5.4 10*3/uL (ref 4.0–10.5)

## 2014-04-17 LAB — GLUCOSE, CAPILLARY
Glucose-Capillary: 109 mg/dL — ABNORMAL HIGH (ref 70–99)
Glucose-Capillary: 144 mg/dL — ABNORMAL HIGH (ref 70–99)
Glucose-Capillary: 96 mg/dL (ref 70–99)

## 2014-04-17 LAB — PROTIME-INR
INR: 2.69 — AB (ref 0.00–1.49)
Prothrombin Time: 28.6 seconds — ABNORMAL HIGH (ref 11.6–15.2)

## 2014-04-17 MED ORDER — METOLAZONE 2.5 MG PO TABS: 2.5000 mg | ORAL_TABLET | Freq: Every day | ORAL | Status: DC

## 2014-04-17 MED ORDER — OXYCODONE-ACETAMINOPHEN 10-325 MG PO TABS: 1.0000 | ORAL_TABLET | Freq: Four times a day (QID) | ORAL | Status: AC | PRN

## 2014-04-17 NOTE — Discharge Summary (Signed)
Physician Discharge Summary  AANSHI BATCHELDER ZOX:[id number] DOB: [birth year] DOA: 04/14/2014  PCP: Lemont Fillers., NP  Admit date: 04/14/2014 Discharge date: 04/17/2014  Time spent: 35 minutes  Recommendations for Outpatient Follow-up:  1. Patient and family instructed to follow non-weight bearing status with bed to chair transfers only 2. Pt recommending SNF. I discussed this recommendation with her family, explaining that I felt she would benefit from nursing care and rehab at [facility]. Family wished to have her home with home health services.   Discharge Diagnoses:  Principal Problem:   Fibula fracture Active Problems:   Aphasia as late effect of cerebrovascular accident   DM type 2 (diabetes mellitus, type 2)   UTI (lower urinary tract infection)   CVA (cerebral infarction)   Discharge Condition: Stable/Improved  Diet recommendation: Heart Healthy  Filed Weights   04/15/14 0421 04/16/14 0638 04/17/14 0519  Weight: 52.799 kg (116 lb 6.4 oz) 53.162 kg (117 lb 3.2 oz) 49.85 kg (109 lb 14.4 oz)    History of present illness:  History of Present Illness:This is a 58 y.o. year old female with multiple medical problems including CVA with residual R sided hemiparesis, aphasia and behavioral disturbance, medication noncompliance, systolic CHF w/ recent EF 15% and diffuse hypokinesis s/p pacemaker, seizure disorder, afib on xarelto, presenting with CVA, R fibular fracture, UTI. Level V caveat as pt has baseline aphasia and behavioral dysfunction. Pt noted to have been admitted 7/4-7/7 for multiple issues including acute on chronic systolic heart failure, abd pain w/ anemia among others. Please see discharge summary for full details. Per report, pt fell off edge of bed 3 days ago rolling her ankle. Pt was noted to have pain and swelling in RLE. Pt was directed to ER to eval for possible blood clot. Pt seen at [facility]. Was noted to have non displaced distal fibular shaft fracture above ankle  mortise. Other 2 view imaging including foot, hip, knee xrays WNL. CXR hows severe cardiomegaly w/ pulm vascular congestion. Head CT showed changes over R temporal lobe concerning for CVA. Notable labs including hgb 9.2, K 3.6, Cr 1.2, INR 4.52. UA indicative of infection.    Hospital Course:  Patient is a 58 year old female with a past medical history of CVA with residual right-sided hemiparesis, aphasia, admitted to the medicine service on 04/15/2014, after falling of the edge of bed several days prior to presentation. She initially went to [facility] where imaging studies revealed a nondisplaced distal fibular shaft fracture. A head CT was done on admission which revealed hypodensity and poor gray white differentiation in fairly in the right temporal lobe that raised a question of acute CVA. Neurology was consulted as it was felt this likely represented an artifact. A repeat CT scan of the brain performed on 04/15/2014 showed a large territory chronic left MCA infarct, without acute changes. 1. Right displaced fracture of distal fibula -Patient seen and evaluated by orthopedic surgery, recommending nonoperative management for now.  -Ortho also recommending non-weightbearing for the next 8 weeks, although there is concern for this given patient's history of noncompliance.  -Physical therapy recommending SNF placement -Discussed case with her brother who would prefer for patient to be discharged home rather than going to SNF, despite our recommendations for SNF placement -Will follow up with ortho 2. History of CVA  -Patient appears be functioning at her baseline.  -Initial CT scan of the brain showed hypodensity which could reflect acute CVA, neurology consulted, repeat CT scan of brain performed on 04/15/2014 showing no  acute intracranial abnormalities and again demonstrating old CVA infarct.  -Patient remains neurologically stable, findings on initial CT scan likely artifactual.  3. Urinary tract  infection.  -Patient receiving 3 days of IV antibiotic therapy with Ceftriaxone.   4. Atrial fibrillation  -Patient remains on chronic anticoagulation with Xarelto at 15 mg by mouth daily.  -Pharmacy consulted for dosing  -Rate controlled, continue digoxin and carvedilol   Consultations:  Orthopedic Surgery  Neurology  Discharge Exam: Filed Vitals:   04/17/14 1100  BP: 114/61  Pulse: 71  Temp:   Resp:     General: Poor historian, in no acute distress  Cardiovascular: Regular rate rhythm normal S1-S2  Respiratory: Clear to auscultation bilaterally  Abdomen: Soft nontender not  Musculoskeletal: Right ankle wrapped, patient reporting pain with movement 2 right extremity   Discharge Instructions You were cared for by a hospitalist during your hospital stay. If you have any questions about your discharge medications or the care you received while you were in the hospital after you are discharged, you can call the unit and asked to speak with the hospitalist on call if the hospitalist that took care of you is not available. Once you are discharged, your primary care physician will handle any further medical issues. Please note that NO REFILLS for any discharge medications will be authorized once you are discharged, as it is imperative that you return to your primary care physician (or establish a relationship with a primary care physician if you do not have one) for your aftercare needs so that they can reassess your need for medications and monitor your lab values.  Discharge Instructions   Call MD for:  difficulty breathing, headache or visual disturbances    Complete by:  As directed      Call MD for:  extreme fatigue    Complete by:  As directed      Call MD for:  persistant dizziness or light-headedness    Complete by:  As directed      Call MD for:  persistant nausea and vomiting    Complete by:  As directed      Call MD for:  temperature >100.4    Complete by:  As directed       Diet - low sodium heart healthy    Complete by:  As directed      Increase activity slowly    Complete by:  As directed      Non weight bearing    Complete by:  As directed   Laterality:  right  Extremity:  Lower            Medication List         acetaminophen 325 MG tablet  Commonly known as:  TYLENOL  Take 650 mg by mouth every 6 (six) hours as needed for pain.     albuterol (2.5 MG/3ML) 0.083% nebulizer solution  Commonly known as:  PROVENTIL  Take 2.5 mg by nebulization every 6 (six) hours as needed. For cough or wheeze     ALPRAZolam 1 MG tablet  Commonly known as:  XANAX  Take 1 tablet (1 mg total) by mouth at bedtime as needed for anxiety.     amiodarone 200 MG tablet  Commonly known as:  PACERONE  - Take 200 mg by mouth See admin instructions. Takes 200mg  on M,T,W,Th,F only  - Skips doses on Sat and Sun     furosemide 40 MG tablet  Commonly known as:  LASIX  Take 1 tablet (40 mg total) by mouth 2 (two) times daily.     insulin lispro 100 UNIT/ML injection  Commonly known as:  HUMALOG  Inject into the skin 3 (three) times daily before meals. On sliding scale     iron polysaccharides 150 MG capsule  Commonly known as:  NIFEREX  Take 1 capsule (150 mg total) by mouth daily.     levETIRAcetam 1000 MG tablet  Commonly known as:  KEPPRA  Take 2,000 mg by mouth 2 (two) times daily.     levothyroxine 50 MCG tablet  Commonly known as:  SYNTHROID, LEVOTHROID  Take 50 mcg by mouth daily before breakfast.     LINZESS 145 MCG Caps capsule  Generic drug:  Linaclotide  Take 145 mcg by mouth daily.     lisinopril 2.5 MG tablet  Commonly known as:  PRINIVIL,ZESTRIL  Take 1 tablet (2.5 mg total) by mouth daily.     metolazone 2.5 MG tablet  Commonly known as:  ZAROXOLYN  Take 1 tablet (2.5 mg total) by mouth daily.     oxyCODONE-acetaminophen 10-325 MG per tablet  Commonly known as:  PERCOCET  Take 1 tablet by mouth every 6 (six) hours as needed for  pain.     pantoprazole 40 MG tablet  Commonly known as:  PROTONIX  Take 1 tablet (40 mg total) by mouth daily.     polyethylene glycol packet  Commonly known as:  MIRALAX / GLYCOLAX  Take 17 g by mouth daily as needed (constipation).     QUEtiapine Fumarate 150 MG 24 hr tablet  Commonly known as:  SEROQUEL XR  Take 1 tablet (150 mg total) by mouth at bedtime.     spironolactone 25 MG tablet  Commonly known as:  ALDACTONE  Take 25 mg by mouth daily.     XARELTO 20 MG Tabs tablet  Generic drug:  rivaroxaban  Take 20 mg by mouth daily.       Allergies  Allergen Reactions  . Penicillins Rash  . Sulfa Antibiotics Rash       Follow-up Information   Follow up with HANDY,MICHAEL H, MD. Schedule an appointment as soon as possible for a visit in 14 days. (re-check)    Specialty:  Orthopedic Surgery   Contact information:   [address] [place] [address] [phone]       Follow up with [facility]. (RN/PT/OT)    Contact information:   [address] [place] [address] [phone]       Follow up with Lemont Fillers., NP In 1 week.   Specialty:  Internal Medicine   Contact information:   [address] [place] [address] [phone]        The results of significant diagnostics from this hospitalization (including imaging, microbiology, ancillary and laboratory) are listed below for reference.    Significant Diagnostic Studies: Dg Chest 1 View  04/14/2014   CLINICAL DATA:  Fall  EXAM: CHEST - 1 VIEW  COMPARISON:  Prior radiograph from 03/21/2014  FINDINGS: Left-sided pacemaker/AICD is unchanged. Severe cardiomegaly grossly stable. Mediastinal silhouette within normal limits.  The lungs are normally inflated. Mild diffuse pulmonary vascular congestion without frank pulmonary edema. No definite pleural effusion. No focal infiltrate. No pneumothorax.  No acute osseous abnormality identified.  IMPRESSION:  Severe cardiomegaly with mild diffuse pulmonary vascular congestion without overt pulmonary edema. No other acute cardiopulmonary abnormality.   Electronically Signed   By: Sharlet Salina  Phill Myron M.D.   On: 04/14/2014 19:29   Dg Chest 2 View  04/15/2014   CLINICAL DATA:  Stroke, weakness, fall  EXAM: CHEST  2 VIEW  COMPARISON:  04/14/2014  FINDINGS: Cardiomegaly again noted. Central vascular congestion without convincing pulmonary edema. Dual lead cardiac pacemaker is unchanged in position. There is bilateral basilar atelectasis or scarring.  IMPRESSION: Cardiomegaly. Central vascular congestion without convincing pulmonary edema. Bilateral basilar atelectasis or scarring.   Electronically Signed   By: Natasha Mead M.D.   On: 04/15/2014 12:52   Dg Hip Complete Right  04/14/2014   CLINICAL DATA:  Diffuse right lower extremity pain secondary to a fall from the bed 3 days ago.  EXAM: RIGHT HIP - COMPLETE 2+ VIEW  COMPARISON:  None.  FINDINGS: There is no evidence of hip fracture or dislocation. There is no evidence of arthropathy or other focal bone abnormality.  IMPRESSION: No osseous abnormality.   Electronically Signed   By: Geanie Cooley M.D.   On: 04/14/2014 19:38   Dg Knee 2 Views Right  04/14/2014   CLINICAL DATA:  Fall from bed.  Diffuse right lower extremity pain.  EXAM: RIGHT KNEE - 1-2 VIEW  COMPARISON:  07/18/2012  FINDINGS: Diffuse bony demineralization. Poor definition of fat plane surround knee make today's exam indeterminate for knee effusion. Suspected diffuse subcutaneous edema. No cortical discontinuity identified to suggest fracture.  IMPRESSION: 1. Diffuse bony demineralization. 2. Suspected subcutaneous edema. Indeterminate exam for knee effusion.   Electronically Signed   By: Herbie Baltimore M.D.   On: 04/14/2014 19:36   Dg Tibia/fibula Right  04/14/2014   CLINICAL DATA:  Fall.  EXAM: RIGHT TIBIA AND FIBULA - 2 VIEW  COMPARISON:  None.  FINDINGS: Slightly angulated fracture of the distal  right fibular shaft is present. Tibia is intact.  IMPRESSION: Slightly angulated fracture of the distal right fibular shaft.   Electronically Signed   By: Maisie Fus  Register   On: 04/14/2014 19:36   Dg Ankle 2 Views Right  04/16/2014   CLINICAL DATA:  Followup post casting. Patient declined to finish the exam after the lateral view.  EXAM: RIGHT ANKLE - 2 VIEW  COMPARISON:  04/15/2014  FINDINGS: Single lateral views performed, showing casting material overlying the ankle which significantly limits detail. There has been no appreciable change in alignment as demonstrated on the lateral view. There is soft tissue swelling involving the foot.  IMPRESSION: 1. Study is limited by the number views obtained. 2. No significant change in alignment demonstrated by the lateral view.   Electronically Signed   By: Rosalie Gums M.D.   On: 04/16/2014 14:01   Dg Ankle 2 Views Right  04/14/2014   CLINICAL DATA:  Fall  EXAM: RIGHT ANKLE - 2 VIEW  COMPARISON:  None.  FINDINGS: There is an acute transverse fracture through the distal right fibular shaft. Ankle mortise is grossly approximated. No other definite fracture.  Mild diffuse soft tissue swelling present left ankle.  Diffuse osteopenia present.  IMPRESSION: Acute nondisplaced transverse fracture of the distal right fibular shaft.   Electronically Signed   By: Rise Mu M.D.   On: 04/14/2014 19:35   Dg Ankle Complete Right  04/17/2014   CLINICAL DATA:  Bimalleolar fracture; new cast  EXAM: RIGHT ANKLE - COMPLETE 3+ VIEW  COMPARISON:  Lateral in plaster views of the right ankle of April 16, 2014.  FINDINGS: There is some deformity of the cortex of the distal fibular meta diaphysis. No displaced fracture  of the medial malleolus is demonstrated. The ankle joint mortise is preserved. The bones of the hindfoot exhibit no acute abnormality.  IMPRESSION: The known distal fibular fracture is in reasonable anatomic alignment. A reported medial malleolar fracture is not  clearly visible.   Electronically Signed   By: David  SwazilandJordan   On: 04/17/2014 11:01   Dg Ankle Complete Right  04/15/2014   CLINICAL DATA:  Followup fracture  EXAM: RIGHT ANKLE - COMPLETE 3+ VIEW  COMPARISON:  04/14/2014  FINDINGS: Study is limited by osteopenia. There is casting material artifact. Again noted oblique minimal displaced fracture in distal fibula. Question mild disruption of ankle mortise with mild widening of medial tibiotalar space.  IMPRESSION: Again noted to oblique minimal displaced fracture of distal fibula. Question mild disruption of ankle mortise with widening of medial tibiotalar joint space.   Electronically Signed   By: Natasha MeadLiviu  Pop M.D.   On: 04/15/2014 12:56   Ct Head Wo Contrast  04/16/2014   CLINICAL DATA:  Mental status changes  EXAM: CT HEAD WITHOUT CONTRAST  TECHNIQUE: Contiguous axial images were obtained from the base of the skull through the vertex without intravenous contrast.  COMPARISON:  Head CT 01/10/2014  FINDINGS: Encephalomalacia change again appreciated in a left MCA distribution involving the left temporal, frontal and parietal lobes, consistent with chronic infarction. There is left lateral ventricular hydrocephalus ex vacuo. No further evidence of extra-axial fluid collections nor acute hemorrhage. No mass effect. The calvarium is negative. The visualized paranasal sinuses and mastoid air cells are patent.  IMPRESSION: No acute intracranial abnormality.  Stable chronic changes.   Electronically Signed   By: Salome HolmesHector  Cooper M.D.   On: 03/21/2014 13:34   Ct Head Wo Contrast  04/15/2014   CLINICAL DATA:  Question right temporal stroke  EXAM: CT HEAD WITHOUT CONTRAST  TECHNIQUE: Contiguous axial images were obtained from the base of the skull through the vertex without intravenous contrast.  COMPARISON:  CT 04/14/2014  FINDINGS: Large territory chronic infarct in the left middle cerebral artery territory involving the frontal temporal and parietal lobes as well as  the left basal ganglia.  Negative for acute infarct. Right temporal lobe area appears normal on the current study. Artifact is present on the prior study. Negative for hemorrhage or mass.  IMPRESSION: Large territory chronic left MCA infarct.  No acute abnormality.   Electronically Signed   By: Marlan Palauharles  Clark M.D.   On: 04/15/2014 09:28   Ct Head Wo Contrast  04/14/2014   CLINICAL DATA:  Fall. Head injury. Prior stroke, nonverbal patient.  EXAM: CT HEAD WITHOUT CONTRAST  TECHNIQUE: Contiguous axial images were obtained from the base of the skull through the vertex without intravenous contrast.  COMPARISON:  Multiple exams, including 03/21/2014  FINDINGS: I am unable to locate the report from the 03/21/2014 exam. I have [employer], [employer] personnel investigating.  Large prior left MCA distribution stroke noted with associated wall Peyton NajjarLarry and degeneration causing a small left cerebral peduncle. Stroke extends up into the white matter the vertex, with some medial involvement of the left frontal lobe as shown on image 25 of series 2, stable.  The large region of involvement appears similar to the prior exam.  Reduced gray-white differentiation inferiorly in the right temporal lobe. This is less striking on series 4 is compared to series 2 and may partially be due to motion artifact and streak artifact, but appears asymmetric enough to potentially represent a early stroke.  No intracranial hemorrhage or mass  lesion identified.  IMPRESSION: 1. Hypodensity and poor gray-white differentiation inferiorly in the right temporal lobe such that an acute CVA involving the right temporal lobe cannot be readily excluded. This is in area which can be subject to artifact simulating a stroke. Consider MRI for further characterization. 2. Old large left MCA distribution stroke.  These results were called by telephone at the time of interpretation on 04/14/2014 at 7:31 pm to Dr. Randa Spike, Romeo Apple , who verbally acknowledged these results.    Electronically Signed   By: Herbie Baltimore M.D.   On: 04/14/2014 19:34   Ct Abdomen Pelvis W Contrast  03/21/2014   CLINICAL DATA:  Abdominal pain, too lethargic to tolerate oral contrast  EXAM: CT ABDOMEN AND PELVIS WITH CONTRAST  TECHNIQUE: Multidetector CT imaging of the abdomen and pelvis was performed using the standard protocol following bolus administration of intravenous contrast.  CONTRAST:  100 mL Omnipaque 300  COMPARISON:  None.  FINDINGS: Severe multichamber cardiomegaly. Very small right pleural effusion.  The liver, spleen, adrenals, pancreas, right kidney are unremarkable. Small stable benign-appearing cyst left kidney otherwise unremarkable.  An inferior vena caval filter appreciated below the renal veins. There is no abdominal aortic aneurysm. Atherosclerotic calcifications are appreciated within the aorta and iliac vessels.  No abdominal pelvic masses, or loculated fluid collections. Trace amount of ascites along the inferior border of the liver. Small amount of free fluid within the dependent portion of pelvis.  The bowel and appendix are negative, patient status post cholecystectomy.  No abdominal wall nor inguinal hernia.  No aggressive appearing osseous lesions.  IMPRESSION: Mild area is of ascites along the inferior border of the liver and deep in the portion of the pelvis. These findings are nonspecific. There is otherwise no CT evidence of obstructive or inflammatory abnormalities.  Multichamber cardiomegaly  Small right pleural effusion   Electronically Signed   By: Salome Holmes M.D.   On: 03/21/2014 13:52   Dg Abd Acute W/chest  03/21/2014   CLINICAL DATA:  Abdominal pain for 3 days  EXAM: ACUTE ABDOMEN SERIES (ABDOMEN 2 VIEW & CHEST 1 VIEW)  COMPARISON:  02/11/2014 chest radiograph  FINDINGS: Dual-chamber left approach ICD/ pacer leads have unchanged orientation. There is chronic, marked cardiopericardial enlargement. There is bronchial cuffing, cephalized blood flow, and  Kerley lines. The retrocardiac lung is obscured by the heart.  The bowel gas pattern is nonobstructed. Cholecystectomy clips are noted. The liver shadow is prominent, possibly hepatic congestion. No evidence for urolithiasis. No pneumoperitoneum.  IMPRESSION: 1. CHF. 2. Negative abdominal radiographs.   Electronically Signed   By: Tiburcio Pea M.D.   On: 03/21/2014 03:33   Dg Foot 2 Views Right  04/14/2014   CLINICAL DATA:  Fall.  EXAM: RIGHT FOOT - 2 VIEW  COMPARISON:  None.  FINDINGS: Diffuse osteopenia and degenerative change. No acute bony or joint abnormality identified.  IMPRESSION: No acute bony or joint abnormality. Diffuse osteopenia and degenerative change.   Electronically Signed   By: Maisie Fus  Register   On: 04/14/2014 19:34    Microbiology: Recent Results (from the past 240 hour(s))  URINE CULTURE     Status: None   Collection Time    04/14/14  8:11 PM      Result Value Ref Range Status   Specimen Description URINE, CLEAN CATCH   Final   Special Requests NONE   Final   Culture  Setup Time     Final   Value: 04/15/2014 00:16  Performed at [facility] Count     Final   Value: >=100,000 COLONIES/ML     Performed at [facility]   Culture     Final   Value: ESCHERICHIA COLI     Performed at [facility]   Report Status PENDING   Incomplete     Labs: Basic Metabolic Panel:  Recent Labs Lab 04/14/14 1732 04/15/14 0135 04/17/14 0414  NA 145  --  142  K 3.6*  --  3.2*  CL 102  --  103  CO2 29  --  28  GLUCOSE 114*  --  95  BUN 15  --  14  CREATININE 1.20* 0.96 1.00  CALCIUM 9.7  --  9.0   Liver Function Tests:  Recent Labs Lab 04/14/14 1732  AST 37  ALT 17  ALKPHOS 65  BILITOT 2.0*  PROT 8.3  ALBUMIN 3.1*   No results found for this basename: LIPASE, AMYLASE,  in the last 168 hours No results found for this basename: AMMONIA,  in the last 168 hours CBC:  Recent Labs Lab 04/14/14 1732 04/15/14 0135 04/17/14 0414   WBC 5.7 5.9 5.4  NEUTROABS 3.9  --   --   HGB 9.2* 8.6* 9.0*  HCT 32.3* 30.4* 32.0*  MCV 79.0 78.6 78.8  PLT 250 245 269   Cardiac Enzymes: No results found for this basename: CKTOTAL, CKMB, CKMBINDEX, TROPONINI,  in the last 168 hours BNP: BNP (last 3 results)  Recent Labs  01/10/14 0912 03/21/14 0633 04/15/14 0135  PROBNP 7500.0* 11633.0* 10371.0*   CBG:  Recent Labs Lab 04/16/14 2139 04/16/14 2334 04/17/14 0539 04/17/14 0751 04/17/14 1156  GLUCAP 101* 108* 109* 144* 96       Signed:  Samika Vetsch  [facility] 04/17/2014, 12:45 PM

## 2014-04-17 NOTE — Progress Notes (Signed)
Orthopaedic Trauma Service Progress Note  Subjective  No acute ortho issues  SLC applied   Objective   BP 114/61  Pulse 71  Temp(Src) 98.1 F (36.7 C) (Oral)  Resp 18  Ht 5' (1.524 m)  Wt 49.85 kg (109 lb 14.4 oz)  BMI 21.46 kg/m2  SpO2 96%  Intake/Output     07/30 0701 - 07/31 0700 07/31 0701 - 08/01 0700   P.O. 420    Total Intake(mL/kg) 420 (8.4)    Urine (mL/kg/hr) 125 (0.1)    Total Output 125     Net +295          Urine Occurrence 3 x 1 x      Exam  Gen: lying in bed, NAD Ext:      Right Lower Extremity   SLC in place  No acute changes noted  Skin stable    xrays  New R ankle films show good alignment of fracture   Assessment and Plan   POD/HD#: 31   58 year old female with numerous medical comorbidities status post ground level fall with right bimalleolar ankle fracture  1. Fall  2. Right bimalleolar ankle fracture- SER-4 type pattern             Short Leg Cast              Strongly recommend bed to chair transfers only             NWB R Lex x 8 weeks               Ice and elevate               Follow up xrays show improved alignment post casting              3. continue per medicine service             follow up with ortho in 2 weeks    Mearl Latin, PA-C [facility] [phone] (P) 04/17/2014 12:46 PM  **Disclaimer: This note may have been dictated with voice recognition software. Similar sounding words can inadvertently be transcribed and this note may contain transcription errors which may not have been corrected upon publication of note.**

## 2014-04-17 NOTE — Progress Notes (Signed)
Called son and left message on voicemail to return call to hospital. Also called brother where I received no answer. He has no voicemail. Will try to call again.

## 2014-04-17 NOTE — Consult Note (Signed)
I have reviewed and discussed in detail with Mr. Renae Fickle the patient's presentation, examination findings, and I formulated the plan outlined above.  Cast is in excellent position and fitting well.  Latest x-rays look outstanding.  I have also seen and examined the patient. I agree with the findings above.  Budd Palmer, MD 04/17/2014 1:03 PM

## 2014-04-17 NOTE — Progress Notes (Signed)
Due to patient's son working, and patient not being able to comprehend discharge teaching, I left a message on his voicemail to given me a call so that we can go over discharge teaching.

## 2014-04-17 NOTE — Progress Notes (Signed)
The patient refused her 2200 dose of Seroquel.

## 2014-04-17 NOTE — Progress Notes (Signed)
Occupational Therapy Treatment Patient Details Name: Elizabeth Love MRN: [id number] DOB: 01/22/1956 Today's Date: 04/17/2014    History of present illness Pt is a 58 y/o female with a PMH of CVA and R sided residual weakness, and ICD. Pt presenting after fall off EOB 3 days PTA. She hit her head and rolled her ankle at that time. Imaging revealed R fibular fracture and evidence of acute CVA.    OT comments  Pt seen today for ADLs and functional mobility. Pt was minimally participative throughout session and became agitated. Therapy had not previously been able to get pt OOB and have serious concerns regarding pt's ability to go home safely with family given NWB status. Pt eventually sat EOB with support and performed sit<>stand with +2 mod assist and required RLE to be held to prevent WB through leg. Still have serious concerns about pt's safety if she d/c home with family given cognitive status and baseline and deconditioning. Continuing to recommend SNF for rehab prior to return home.    Follow Up Recommendations  SNF;Supervision/Assistance - 24 hour    Equipment Recommendations  None recommended by OT       Precautions / Restrictions Precautions Precautions: Fall Precaution Comments: concerned about pt's ability/memory to maintain NWB status Restrictions Weight Bearing Restrictions: Yes RLE Weight Bearing: Non weight bearing       Mobility Bed Mobility Overal bed mobility: Needs Assistance Bed Mobility: Rolling;Sidelying to Sit Rolling: Min assist Sidelying to sit: Min assist;HOB elevated Supine to sit: Max assist     General bed mobility comments: Pt agitated during transfer to sitting EOB and was hitting/kicking at therapists at times. Assist to initiate transfer and pt was able to complete on her own. To return to supine, pt required max assist as she was not participating in transfer.   Transfers Overall transfer level: Needs assistance Equipment used: 2 person hand  held assist Transfers: Sit to/from Stand Sit to Stand: Mod assist;+2 physical assistance         General transfer comment: Pt able to perform sit<>stand x2 with +2 assist for safety. Pt was able to maintain NWB status well on first attempt, and fairly well on second attempt. When asked, pt able to state NWB status on LLE.     Balance Overall balance assessment: Needs assistance Sitting-balance support: No upper extremity supported;Feet supported Sitting balance-Leahy Scale: Poor Sitting balance - Comments: Pt with poor trunk control and falling to the side and backwards without assist.    Standing balance support: Bilateral upper extremity supported;During functional activity Standing balance-Leahy Scale: Zero Standing balance comment: Unable to maintain NWB and standing balance without BUE support.                    ADL Overall ADL's : Needs assistance/impaired     Grooming: Moderate assistance;Wash/dry face;Sitting (applying lotion) Grooming Details (indicate cue type and reason): pt required assistance for EOB sitting due to lack of participation         Upper Body Dressing : Maximal assistance;Sitting                     General ADL Comments: Pt was less cooperative during today's session and demonstrated with agitation including attempting to kick and hit therapists. Family is insistent on taking pt home and therapy is strongly concerned given pt's NWB status on RLE and that she has not yet gotten out of bed. Pt required max encouragement to participate in therapy, however  ultimately sat EOB for grooming activities and performed sit<>stand x2. Pt less conversant today and repeated "move" several times to therapists.                Cognition  Arousal/Alertness: Awake/Alert Behavior During Therapy: Anxious;Impulsive;Agitated Overall Cognitive Status: No family/caregiver present to determine baseline cognitive functioning Area of Impairment:  Orientation;Attention;Memory;Following commands;Safety/judgement;Awareness;Problem solving Orientation Level: Person Current Attention Level: Focused Memory: Decreased recall of precautions;Decreased short-term memory  Following Commands: Follows one step commands with increased time Safety/Judgement: Decreased awareness of safety;Decreased awareness of deficits Awareness: Intellectual Problem Solving: Slow processing;Decreased initiation;Difficulty sequencing;Requires verbal cues;Requires tactile cues                   Pertinent Vitals/ Pain       Pt unable to report. NAD; agitated         Frequency Min 2X/week     Progress Toward Goals  OT Goals(current goals can now be found in the care plan section)  Progress towards OT goals: Not progressing toward goals - comment (due to agitation and non cooperation)  ADL Goals Pt Will Perform Eating: with modified independence;sitting Pt Will Perform Grooming: with set-up;sitting Pt Will Transfer to Toilet: with min assist (scoot/lateral transfer; drop arm BSC)  Plan Discharge plan remains appropriate    Co-evaluation    PT/OT/SLP Co-Evaluation/Treatment: Yes Reason for Co-Treatment: For patient/therapist safety;Necessary to address cognition/behavior during functional activity PT goals addressed during session: Mobility/safety with mobility;Balance;Strengthening/ROM OT goals addressed during session: ADL's and self-care      End of Session Equipment Utilized During Treatment: Gait belt   Activity Tolerance Treatment limited secondary to agitation   Patient Left in bed;with call bell/phone within reach;with bed alarm set   Nurse Communication Mobility status        Time: 6812-7517 OT Time Calculation (min): 37 min  Charges: OT General Charges $OT Visit: 1 Procedure OT Treatments $Therapeutic Activity: 8-22 mins  Rae Lips [phone] 04/17/2014, 1:46 PM

## 2014-04-17 NOTE — Progress Notes (Signed)
Physical Therapy Treatment Patient Details Name: Elizabeth Love MRN: [id number] DOB: [birth year] Today's Date: 04/17/2014    History of Present Illness Pt is a 58 y/o female with a PMH of CVA and R sided residual weakness, and ICD. Pt presenting after fall off EOB 3 days PTA. She hit her head and rolled her ankle at that time. Imaging revealed R fibular fracture and evidence of acute CVA.     PT Comments    Pt continues to show agitation during therapy sessions, which limits functional activity. Pt demonstrated the ability to attempt NWB on the LLE, however as pt fatigued, was unable to maintain. Family education needed prior to d/c regarding safe transfers and mobility at home while maintaining NWB status on the LLE if pt does not go to SNF.   Follow Up Recommendations  SNF;Supervision/Assistance - 24 hour     Equipment Recommendations  Other (comment) (TBD)    Recommendations for Other Services       Precautions / Restrictions Precautions Precautions: Fall Precaution Comments: concerned about pt's ability/memory to maintain NWB status Restrictions Weight Bearing Restrictions: Yes RLE Weight Bearing: Non weight bearing    Mobility  Bed Mobility Overal bed mobility: Needs Assistance Bed Mobility: Rolling;Sidelying to Sit Rolling: Min assist Sidelying to sit: Min assist;HOB elevated Supine to sit: Max assist     General bed mobility comments: Pt agitated during transfer to sitting EOB and was hitting/kicking at therapists at times. Assist to initiate transfer and pt was able to complete on her own. To return to supine, pt required max assist as she was not participating in transfer.   Transfers Overall transfer level: Needs assistance Equipment used: 2 person hand held assist Transfers: Sit to/from Stand Sit to Stand: Mod assist;+2 physical assistance         General transfer comment: Pt able to perform sit<>stand x2 with +2 assist for safety. Pt was able to maintain  NWB status well on first attempt, and fairly well on second attempt. When asked, pt able to state NWB status on LLE.   Ambulation/Gait                 Stairs            Wheelchair Mobility    Modified Rankin (Stroke Patients Only)       Balance Overall balance assessment: Needs assistance Sitting-balance support: Feet supported;No upper extremity supported Sitting balance-Leahy Scale: Poor Sitting balance - Comments: Pt with poor trunk control and falling to the side and backwards without assist.    Standing balance support: Bilateral upper extremity supported;During functional activity Standing balance-Leahy Scale: Zero Standing balance comment: Unable to maintain NWB and standing balance without BUE support.                     Cognition Arousal/Alertness: Awake/alert Behavior During Therapy: Anxious;Impulsive;Agitated Overall Cognitive Status: No family/caregiver present to determine baseline cognitive functioning Area of Impairment: Orientation;Attention;Memory;Following commands;Safety/judgement;Awareness;Problem solving Orientation Level: Person Current Attention Level: Focused Memory: Decreased recall of precautions;Decreased short-term memory Following Commands: Follows one step commands with increased time Safety/Judgement: Decreased awareness of safety;Decreased awareness of deficits Awareness: Intellectual Problem Solving: Slow processing;Decreased initiation;Difficulty sequencing;Requires verbal cues;Requires tactile cues      Exercises      General Comments        Pertinent Vitals/Pain Vitals stable throughout session.     Home Living  Prior Function            PT Goals (current goals can now be found in the care plan section) Acute Rehab PT Goals PT Goal Formulation: Patient unable to participate in goal setting Time For Goal Achievement: 04/22/14 Potential to Achieve Goals: Fair Progress towards  PT goals: Progressing toward goals    Frequency  Min 3X/week    PT Plan Current plan remains appropriate    Co-evaluation PT/OT/SLP Co-Evaluation/Treatment: Yes Reason for Co-Treatment: For patient/therapist safety;Necessary to address cognition/behavior during functional activity PT goals addressed during session: Mobility/safety with mobility;Balance;Strengthening/ROM       End of Session Equipment Utilized During Treatment: Gait belt Activity Tolerance: Treatment limited secondary to agitation Patient left: in bed;with call bell/phone within reach;with bed alarm set     Time: [id number] PT Time Calculation (min): 29 min  Charges:  $Therapeutic Activity: 8-22 mins                    G Codes:      Ruthann CancerHamilton, Vernal Rutan 04/17/2014, 12:31 PM  Ruthann CancerLaura Hamilton, PT, DPT Acute Rehabilitation Services Pager: [phone]

## 2014-04-17 NOTE — Progress Notes (Signed)
I saw and examined the patient with Mr. Elizabeth Love, communicating the findings and plan noted above. Cast is in excellent position and fitting well.  Latest x-rays look outstanding.   Myrene Galas, MD [facility] [phone] (p)

## 2014-04-17 NOTE — Progress Notes (Signed)
The patient refused all of her medications this morning including her Keppra.  The [facility] on-call was notified.

## 2014-04-17 NOTE — Progress Notes (Signed)
Son returned call. Asked son when would be a good time to discharge the patient. Son will be working and can not pick up patient. Would like [place] to set up transport for discharge home.

## 2014-04-17 NOTE — Progress Notes (Signed)
Patient leaving hospital via ambulance transport. Waiting for call from son to give discharge teaching.

## 2014-04-17 NOTE — Progress Notes (Signed)
Patient refused her morning medications. She did take percocet earlier this morning for pain.

## 2014-04-17 NOTE — Progress Notes (Signed)
CSW (Clinical Child psychotherapist) informed pt will need non-emergent ambulance home. CSW prepared med necessity form and called [facility]. Pt nurse aware.  Aliviya Schoeller, LCSWA [phone]

## 2014-04-18 ENCOUNTER — Telehealth: Payer: Self-pay | Admitting: Family

## 2014-04-18 NOTE — Telephone Encounter (Signed)
Please contact pt to schedule a 1 week hospital follow up.

## 2014-04-20 NOTE — Telephone Encounter (Signed)
Appointment already scheduled for 04/24/14

## 2014-04-24 ENCOUNTER — Ambulatory Visit: Payer: Self-pay | Admitting: Family

## 2014-05-04 ENCOUNTER — Inpatient Hospital Stay (HOSPITAL_COMMUNITY): Admission: RE | Admit: 2014-05-04 | Payer: Self-pay | Source: Ambulatory Visit

## 2014-05-27 ENCOUNTER — Emergency Department (HOSPITAL_BASED_OUTPATIENT_CLINIC_OR_DEPARTMENT_OTHER)
Admission: EM | Admit: 2014-05-27 | Discharge: 2014-05-28 | Disposition: A | Discharge status: Home / Self Care | Payer: Medicare Other | Attending: Emergency Medicine | Admitting: Emergency Medicine

## 2014-05-27 ENCOUNTER — Encounter (HOSPITAL_BASED_OUTPATIENT_CLINIC_OR_DEPARTMENT_OTHER): Payer: Self-pay | Admitting: Emergency Medicine

## 2014-05-27 DIAGNOSIS — Y939 Activity, unspecified: Secondary | ICD-10-CM | POA: Diagnosis not present

## 2014-05-27 DIAGNOSIS — S82209A Unspecified fracture of shaft of unspecified tibia, initial encounter for closed fracture: Secondary | ICD-10-CM | POA: Diagnosis not present

## 2014-05-27 DIAGNOSIS — Z880 Allergy status to penicillin: Secondary | ICD-10-CM | POA: Insufficient documentation

## 2014-05-27 DIAGNOSIS — F410 Panic disorder [episodic paroxysmal anxiety] without agoraphobia: Secondary | ICD-10-CM | POA: Insufficient documentation

## 2014-05-27 DIAGNOSIS — X58XXXA Exposure to other specified factors, initial encounter: Secondary | ICD-10-CM | POA: Insufficient documentation

## 2014-05-27 DIAGNOSIS — I10 Essential (primary) hypertension: Secondary | ICD-10-CM | POA: Diagnosis not present

## 2014-05-27 DIAGNOSIS — I4891 Unspecified atrial fibrillation: Secondary | ICD-10-CM | POA: Diagnosis not present

## 2014-05-27 DIAGNOSIS — Z90710 Acquired absence of both cervix and uterus: Secondary | ICD-10-CM | POA: Insufficient documentation

## 2014-05-27 DIAGNOSIS — Z86718 Personal history of other venous thrombosis and embolism: Secondary | ICD-10-CM | POA: Diagnosis not present

## 2014-05-27 DIAGNOSIS — G40909 Epilepsy, unspecified, not intractable, without status epilepticus: Secondary | ICD-10-CM | POA: Diagnosis not present

## 2014-05-27 DIAGNOSIS — I5022 Chronic systolic (congestive) heart failure: Secondary | ICD-10-CM | POA: Diagnosis not present

## 2014-05-27 DIAGNOSIS — E119 Type 2 diabetes mellitus without complications: Secondary | ICD-10-CM | POA: Diagnosis not present

## 2014-05-27 DIAGNOSIS — S82409A Unspecified fracture of shaft of unspecified fibula, initial encounter for closed fracture: Secondary | ICD-10-CM | POA: Diagnosis not present

## 2014-05-27 DIAGNOSIS — Z794 Long term (current) use of insulin: Secondary | ICD-10-CM | POA: Diagnosis not present

## 2014-05-27 DIAGNOSIS — Z9889 Other specified postprocedural states: Secondary | ICD-10-CM | POA: Diagnosis not present

## 2014-05-27 DIAGNOSIS — Z79899 Other long term (current) drug therapy: Secondary | ICD-10-CM | POA: Insufficient documentation

## 2014-05-27 DIAGNOSIS — G8929 Other chronic pain: Secondary | ICD-10-CM | POA: Diagnosis not present

## 2014-05-27 DIAGNOSIS — J45909 Unspecified asthma, uncomplicated: Secondary | ICD-10-CM | POA: Diagnosis not present

## 2014-05-27 DIAGNOSIS — Z87891 Personal history of nicotine dependence: Secondary | ICD-10-CM | POA: Insufficient documentation

## 2014-05-27 DIAGNOSIS — R109 Unspecified abdominal pain: Secondary | ICD-10-CM | POA: Insufficient documentation

## 2014-05-27 DIAGNOSIS — S82301S Unspecified fracture of lower end of right tibia, sequela: Secondary | ICD-10-CM

## 2014-05-27 DIAGNOSIS — Z8673 Personal history of transient ischemic attack (TIA), and cerebral infarction without residual deficits: Secondary | ICD-10-CM | POA: Diagnosis not present

## 2014-05-27 DIAGNOSIS — Y929 Unspecified place or not applicable: Secondary | ICD-10-CM | POA: Diagnosis not present

## 2014-05-27 DIAGNOSIS — S82831S Other fracture of upper and lower end of right fibula, sequela: Secondary | ICD-10-CM

## 2014-05-27 NOTE — ED Notes (Signed)
Pt reports her stomach started hurting a long time ago. Refuses to answer other questions

## 2014-05-27 NOTE — ED Notes (Signed)
Called to patient's room by patient, she is complaining of abdominal pain, but unable to stay awake long enough to describe type of pain she is experiencing. Pt vss remain stable, in nad at this time

## 2014-05-27 NOTE — ED Notes (Signed)
Per patient's brother, whom she lives with, patient has been taking her medications as prescribed.

## 2014-05-27 NOTE — ED Notes (Addendum)
C/o lower abdominal pain and both legs hurting for several days. , her brother Elizabeth Love states that they changed her to medications to generic medications and that she was unable to sleep the last 3 days, he stated that she sits up from 5 am til 7 at night unable to sleep and in pain,  Pt missed her follow up appointment with orthopedic md. Informed Elizabeth Love that pt is sleeping at this time and refusing to answer our questions

## 2014-05-28 ENCOUNTER — Telehealth: Payer: Self-pay | Admitting: Family

## 2014-05-28 DIAGNOSIS — R109 Unspecified abdominal pain: Secondary | ICD-10-CM | POA: Diagnosis not present

## 2014-05-28 LAB — URINALYSIS, ROUTINE W REFLEX MICROSCOPIC
Glucose, UA: NEGATIVE mg/dL
Hgb urine dipstick: NEGATIVE
Ketones, ur: NEGATIVE mg/dL
LEUKOCYTES UA: NEGATIVE
Nitrite: NEGATIVE
PROTEIN: NEGATIVE mg/dL
Specific Gravity, Urine: 1.014 (ref 1.005–1.030)
UROBILINOGEN UA: 4 mg/dL — AB (ref 0.0–1.0)
pH: 6 (ref 5.0–8.0)

## 2014-05-28 NOTE — ED Provider Notes (Addendum)
CSN: [id number]     Arrival date & time 05/27/14  2303 History   First MD Initiated Contact with Patient 05/28/14 [phone]. Note: I have observed the patient many times since her arrival and she has been somnolent; we have observed her and she has had no acute issues except for urinary incontinence (of which she has a history) requiring nurse intervention. She routinely requests a shot for pain whenever she is awakened by nursing staff.   Chief Complaint  Patient presents with  . Abdominal Pain     (Consider location/radiation/quality/duration/timing/severity/associated sxs/prior Treatment) HPI Level 5 Caveat: somnolence, aphasia. 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 Her brother called the ambulance and had her brought to the ED because he did not think that she had been sleeping during the day yesterday like she would normally sleep. He was concerned that she might had been taking her medications. This history is obtained by the charge nurse who contacted her brother by phone. She has a history of medication noncompliance. Since arrival the patient has been somnolent, though arousable. When she is aroused she requests pain medication, usually in the form of a shot, but does not stay awake long enough to continue the conversation. She is aphasic she is able to answer simple questions.  She indicated to me that she is here with abdominal pain which is the same as her chronic abdominal pain. She is also having pain in her right ankle which is in a cast. Nursing staff who have cared for the patient in the past states that this is typical behavior for the patient when she comes to the ED. Typically she is somnolent and wants pain medication when awakened.   Past Medical History  Diagnosis Date  . Seizures   . Hypertension   . Stroke     2007 - R sided weakness and expressive aphasia  with h/o behavioral problems related to this  . Asthma   . Diabetes mellitus   . Chronic systolic CHF (congestive heart failure)   . Atrial fibrillation     a. On Xarelto after having DVT (prev felt to be poor anticoag cand 2/2 noncompliance).  . History of DVT (deep vein thrombosis)     a. 06/2012: RLE DVT, + protein C deficiency, placed on Xarelto. s/p IVC filter  . Noncompliance     Due to mental status, family has had to coax her to take medicines  . Hypothyroidism   . VT (ventricular tachycardia)     a. h/o ICD ~2007-2008. b. Adm 08/2012 with UTI, had VT/ICD shock x 5 (hypokalemic);  c. 06/2013 s/p ICD Gen change: SJM DC ICD ser #: [id number]  . Protein C deficiency   . NICM (nonischemic cardiomyopathy)     a. s/p AICD ~2008 ([employer]) - previous care [place]. No known hx CAD. b. EF 10% by echo 04/2012;  c. 06/2013 s/p ICD Gen change: SJM DC ICD ser #: [id number]  . 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 Panic attacks   . Anxiety    Past Surgical History  Procedure Laterality Date  . Cholecystectomy    . Appendectomy    . Pacemaker insertion      [employer]  . Vena cava filter placement  07/19/2012    Procedure: INSERTION VENA-CAVA FILTER;  Surgeon: Larina Earthly, MD;  Location:  [facility] OR;  Service: Vascular;  Laterality: [name];  . Insert / replace / remove pacemaker  2014    ICD. St. Jude; inserted 2008 [facility]); gen change 2014 [facility]  . Abdominal hysterectomy     Family History  Problem Relation Age of Onset  . Hypertension Mother   . Coronary artery disease Neg Hx    History  Substance Use Topics  . Smoking status: Former Games developer  . Smokeless tobacco: Never Used  . Alcohol Use: No   OB History   Grav Para Term Preterm Abortions TAB SAB Ect Mult Living                 Review of Systems  Unable to perform ROS   Allergies  Penicillins and Sulfa antibiotics  Home Medications   Prior to Admission medications   Medication Sig Start Date End Date Taking? Authorizing  Provider  acetaminophen (TYLENOL) 325 MG tablet Take 650 mg by mouth every 6 (six) hours as needed for pain.    Historical Provider, MD  albuterol (PROVENTIL) (2.5 MG/3ML) 0.083% nebulizer solution Take 2.5 mg by nebulization every 6 (six) hours as needed. For cough or wheeze    Historical Provider, MD  ALPRAZolam Prudy Feeler) 1 MG tablet Take 1 tablet (1 mg total) by mouth at bedtime as needed for anxiety. 03/24/14   Ripudeep Jenna Luo, MD  amiodarone (PACERONE) 200 MG tablet Take 200 mg by mouth See admin instructions. Takes  on M,T,W,Th,F only Skips doses on Sat and Sun    Historical Provider, MD  furosemide (LASIX) 40 MG tablet Take 1 tablet (40 mg total) by mouth 2 (two) times daily. 03/24/14   Ripudeep Jenna Luo, MD  insulin lispro (HUMALOG) 100 UNIT/ML injection Inject into the skin 3 (three) times daily before meals. On sliding scale    Historical Provider, MD  iron polysaccharides (NIFEREX) 150 MG capsule Take 1 capsule (150 mg total) by mouth daily. 03/24/14   Ripudeep Jenna Luo, MD  levETIRAcetam (KEPPRA) 1000 MG tablet Take 2,000 mg by mouth 2 (two) times daily.    Historical Provider, MD  levothyroxine (SYNTHROID, LEVOTHROID) 50 MCG tablet Take 50 mcg by mouth daily before breakfast.    Historical Provider, MD  Linaclotide (LINZESS) 145 MCG CAPS capsule Take 145 mcg by mouth daily.    Historical Provider, MD  lisinopril (PRINIVIL,ZESTRIL) 2.5 MG tablet Take 1 tablet (2.5 mg total) by mouth daily. 03/24/14   Ripudeep Jenna Luo, MD  metolazone (ZAROXOLYN) 2.5 MG tablet Take 1 tablet (2.5 mg total) by mouth daily. 04/17/14   Jeralyn Bennett, MD  oxyCODONE-acetaminophen (PERCOCET) 10-325 MG per tablet Take 1 tablet by mouth every 6 (six) hours as needed for pain. 04/17/14   Jeralyn Bennett, MD  pantoprazole (PROTONIX) 40 MG tablet Take 1 tablet (40 mg total) by mouth daily. 03/24/14   Ripudeep Jenna Luo, MD  polyethylene glycol (MIRALAX / GLYCOLAX) packet Take 17 g by mouth daily as needed (constipation).     Historical  Provider, MD  QUEtiapine Fumarate (SEROQUEL XR) 150 MG 24 hr tablet Take 1 tablet (150 mg total) by mouth at bedtime. 09/01/13   Marinda Elk, MD  Rivaroxaban (XARELTO) 20 MG TABS tablet Take 20 mg by mouth daily.    Historical Provider, MD  spironolactone (ALDACTONE) 25 MG tablet Take 25 mg by mouth daily.    Historical Provider, MD   BP 108/88  Pulse 74  Temp(Src) 98.4 F (36.9 C) (Oral)  Resp 18  SpO2 98%  Physical Exam General: Well-developed, well-nourished female in no acute distress; appearance consistent with age of record HENT: normocephalic; atraumatic Eyes: pupils equal, round and reactive to light Neck: supple Heart: Regular rate and rhythm Lungs: clear to auscultation bilaterally Abdomen: soft; nondistended; diffusely tender; bowel sounds present Extremities: Right ankle and foot in cast, otherwise no deformity; pulses normal in other extremities Neurologic: Somnolent but arousable; right hemiparesis Skin: Warm and dry   ED Course  Procedures (including critical care time)  MDM   Nursing notes and vitals signs, including pulse oximetry, reviewed.  Summary of this visit's results, reviewed by myself:  Labs:  Results for orders placed during the hospital encounter of 05/27/14 (from the past 24 hour(s))  URINALYSIS, ROUTINE W REFLEX MICROSCOPIC     Status: Abnormal   Collection Time    05/28/14 12:01 AM      Result Value Ref Range   Color, Urine AMBER (*) YELLOW   APPearance CLEAR  CLEAR   Specific Gravity, Urine 1.014  1.005 - 1.030   pH 6.0  5.0 - 8.0   Glucose, UA NEGATIVE  NEGATIVE mg/dL   Hgb urine dipstick NEGATIVE  NEGATIVE   Bilirubin Urine SMALL (*) NEGATIVE   Ketones, ur NEGATIVE  NEGATIVE mg/dL   Protein, ur NEGATIVE  NEGATIVE mg/dL   Urobilinogen, UA 4.0 (*) 0.0 - 1.0 mg/dL   Nitrite NEGATIVE  NEGATIVE   Leukocytes, UA NEGATIVE  NEGATIVE     Hanley Seamen, MD 05/28/14 0449  Hanley Seamen, MD 05/28/14 [phone]

## 2014-05-28 NOTE — ED Notes (Signed)
Pt's brother, Dorinda Hill notified of d/c, discharge instructions reviewed verbally over the phone with him, ptar requested for transfer back home

## 2014-05-28 NOTE — Telephone Encounter (Signed)
Appointment scheduled for 06/09/14

## 2014-05-28 NOTE — Telephone Encounter (Signed)
Please arrange ED follow up for this pt.

## 2014-05-28 NOTE — ED Notes (Signed)
Pt's brother notified of poc and plan for patient to return to the home.

## 2014-06-09 ENCOUNTER — Telehealth: Payer: Self-pay | Admitting: Family

## 2014-06-09 ENCOUNTER — Ambulatory Visit: Payer: Medicare Other | Admitting: Family

## 2014-06-09 NOTE — Telephone Encounter (Signed)
Noted  

## 2014-06-09 NOTE — Telephone Encounter (Signed)
Pt unable to attend appt today, did reschedule on 07/03/14

## 2014-06-10 ENCOUNTER — Encounter (HOSPITAL_COMMUNITY): Payer: Self-pay

## 2014-07-03 ENCOUNTER — Telehealth: Payer: Self-pay | Admitting: *Deleted

## 2014-07-03 ENCOUNTER — Ambulatory Visit: Payer: Medicare Other | Admitting: Family

## 2014-07-03 NOTE — Telephone Encounter (Signed)
Pt did not show for appointment 07/03/2014 at 11:15am for ER follow up

## 2014-07-03 NOTE — Telephone Encounter (Signed)
Noted  

## 2014-07-06 ENCOUNTER — Encounter: Payer: Self-pay | Admitting: Family

## 2014-07-09 ENCOUNTER — Telehealth: Payer: Self-pay | Admitting: Family

## 2014-07-09 NOTE — Telephone Encounter (Signed)
Dismissal Letter sent by Certified Mail 07/09/2014  Received the Return Receipt showing someone picked up the Dismissal 07/13/2014

## 2014-08-27 ENCOUNTER — Encounter (HOSPITAL_COMMUNITY): Payer: Self-pay | Admitting: Internal Medicine

## 2014-09-11 IMAGING — CT CT HEAD W/O CM
1 series · 16 of 30 positions shown, 20 images · non-contrast
Comparison: 08/27/2012

CLINICAL DATA: headache, chest pain.

CT HEAD WITHOUT CONTRAST
TECHNIQUE: Contiguous axial images were obtained from the base of
the skull through the vertex without contrast.

[Series 2: head routine 4.8 h37s · axial · 0.41mm/px · z∈[+26,+159]mm · 16 of 30 slices shown, 20 images]
[im 2/30  brain]
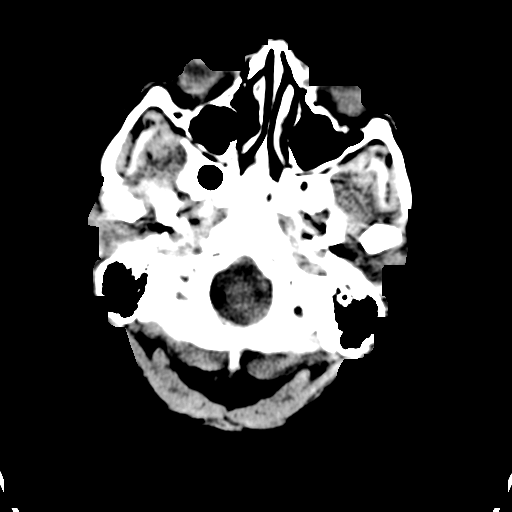
[im 2/30  bone]
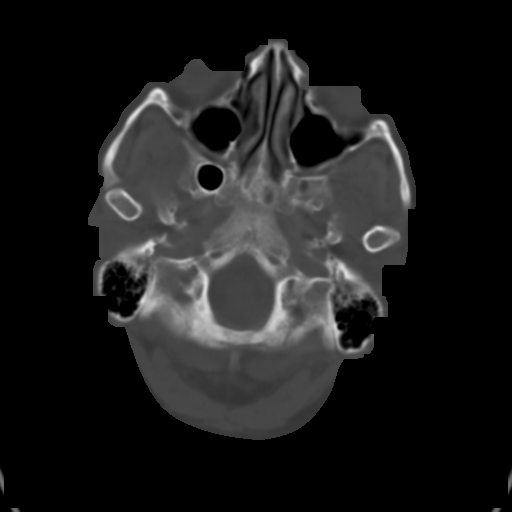
[im 4/30  brain]
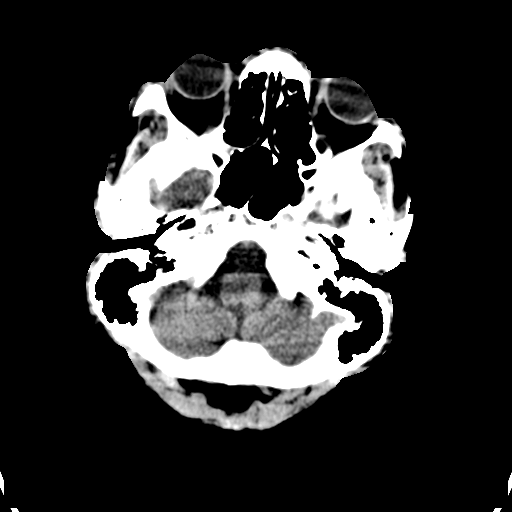
[im 6/30  brain]
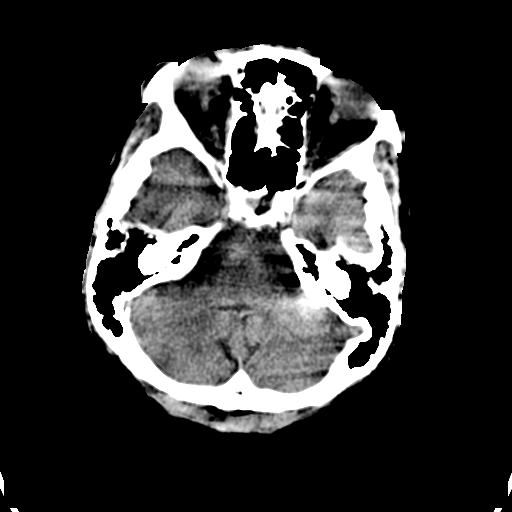
[im 8/30  brain]
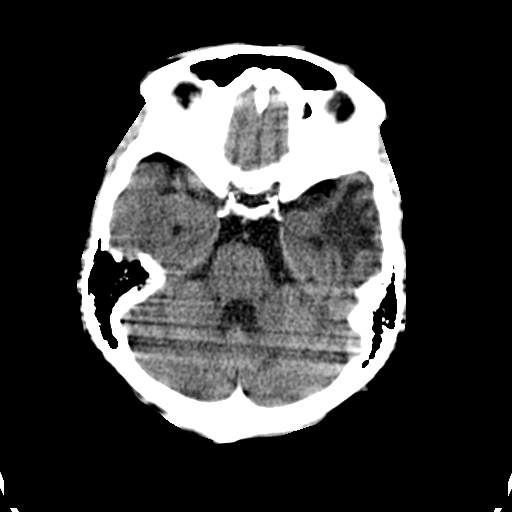
[im 9/30  brain]
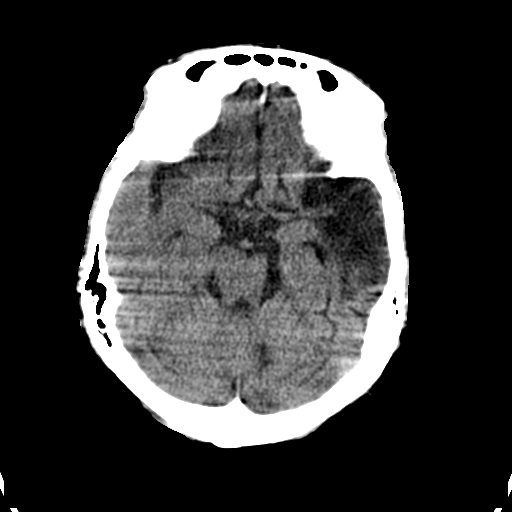
[im 9/30  bone]
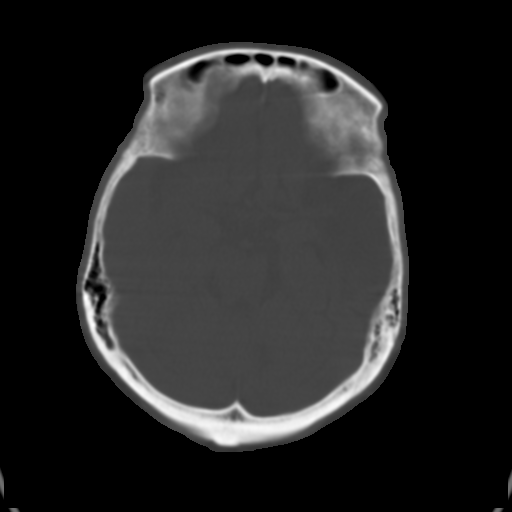
[im 11/30  brain]
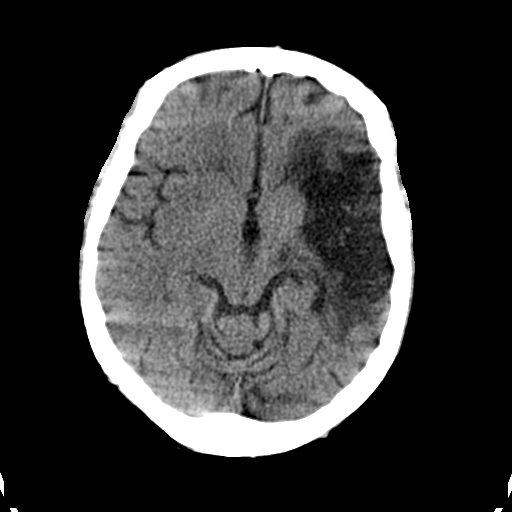
[im 13/30  brain]
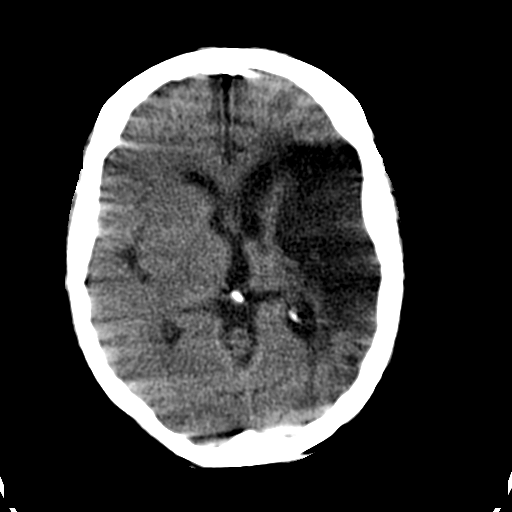
[im 15/30  brain]
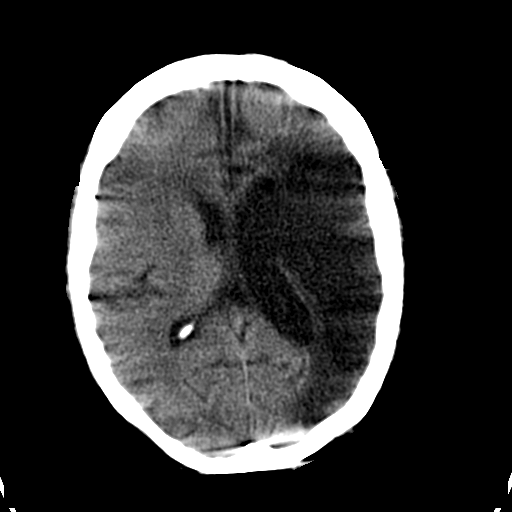
[im 16/30  brain]
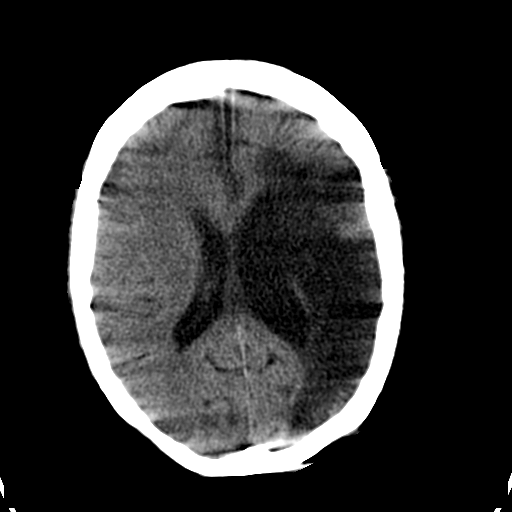
[im 16/30  bone]
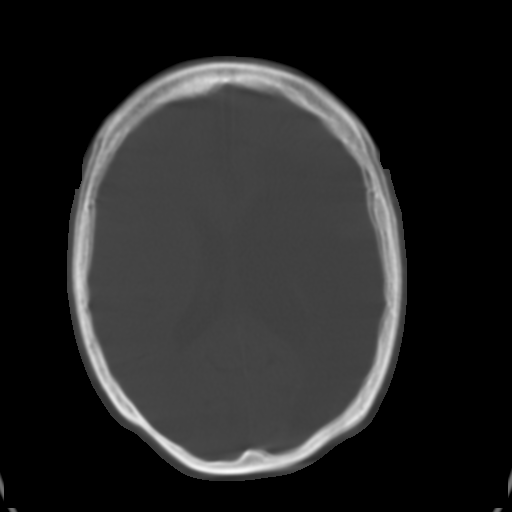
[im 18/30  brain]
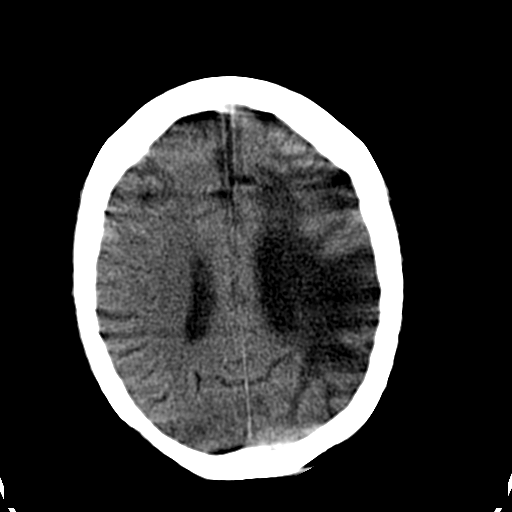
[im 20/30  brain]
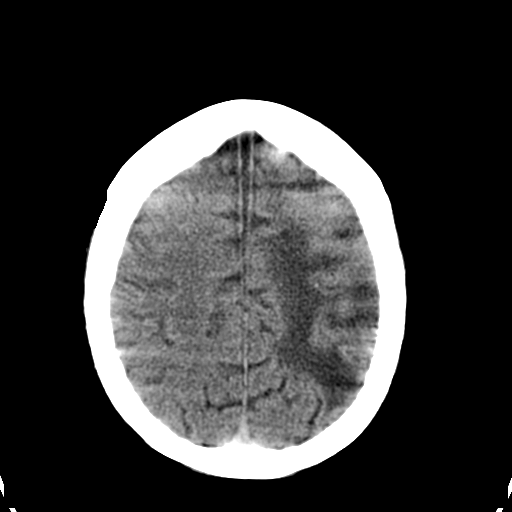
[im 22/30  brain]
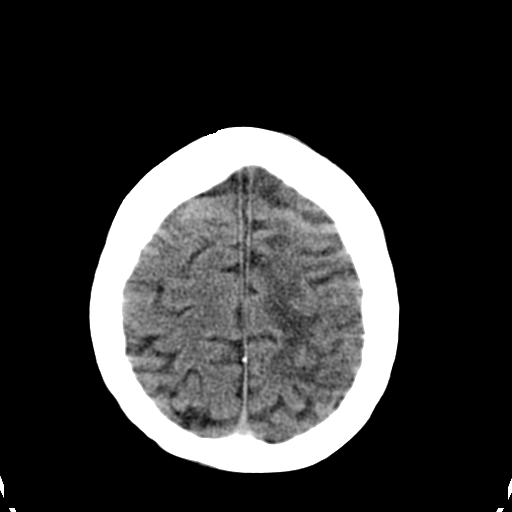
[im 23/30  brain]
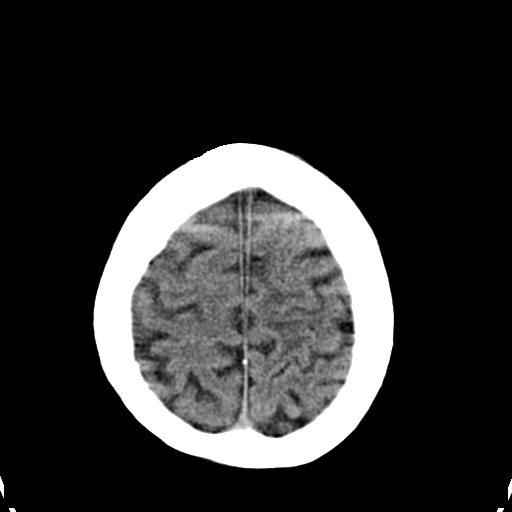
[im 23/30  bone]
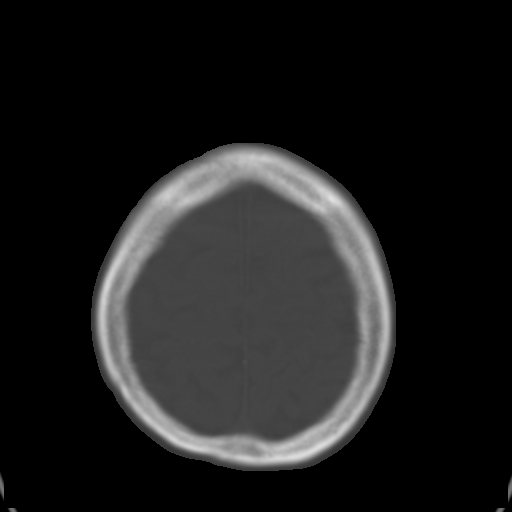
[im 25/30  brain]
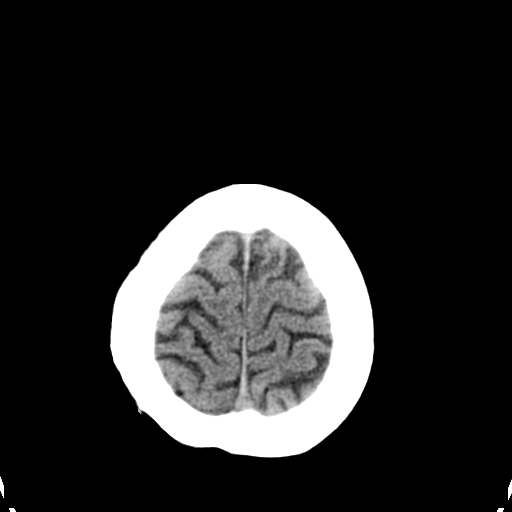
[im 27/30  brain]
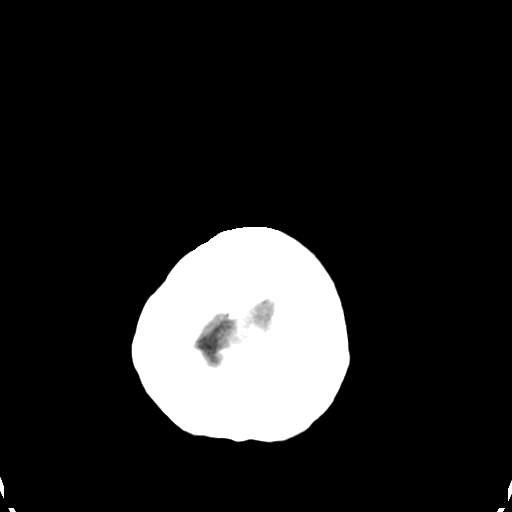
[im 29/30  brain]
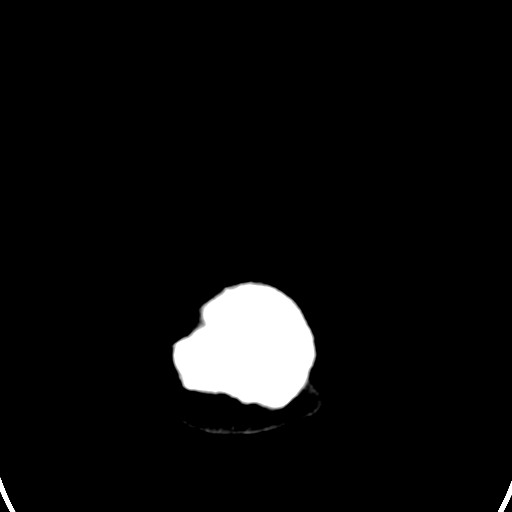

[16 of 30 positions shown; findings below may reference images not displayed]

FINDINGS: Large old left MCA infarct with encephalomalacia, stable.
No acute infarction or hemorrhage.  No hydrocephalus.  No acute
calvarial abnormality. Visualized paranasal sinuses and mastoids
clear.  Orbital soft tissues unremarkable.
IMPRESSION: Old large left MCA infarct.

No acute intracranial abnormality.

## 2014-09-11 IMAGING — CR DG CHEST 2V
1 series · 1 of 1 positions shown · non-contrast
Comparison: Chest x-ray of 09/14/2012

CLINICAL DATA: Chest pain, history of diabetes

CHEST - 2 VIEW

[view not recorded]
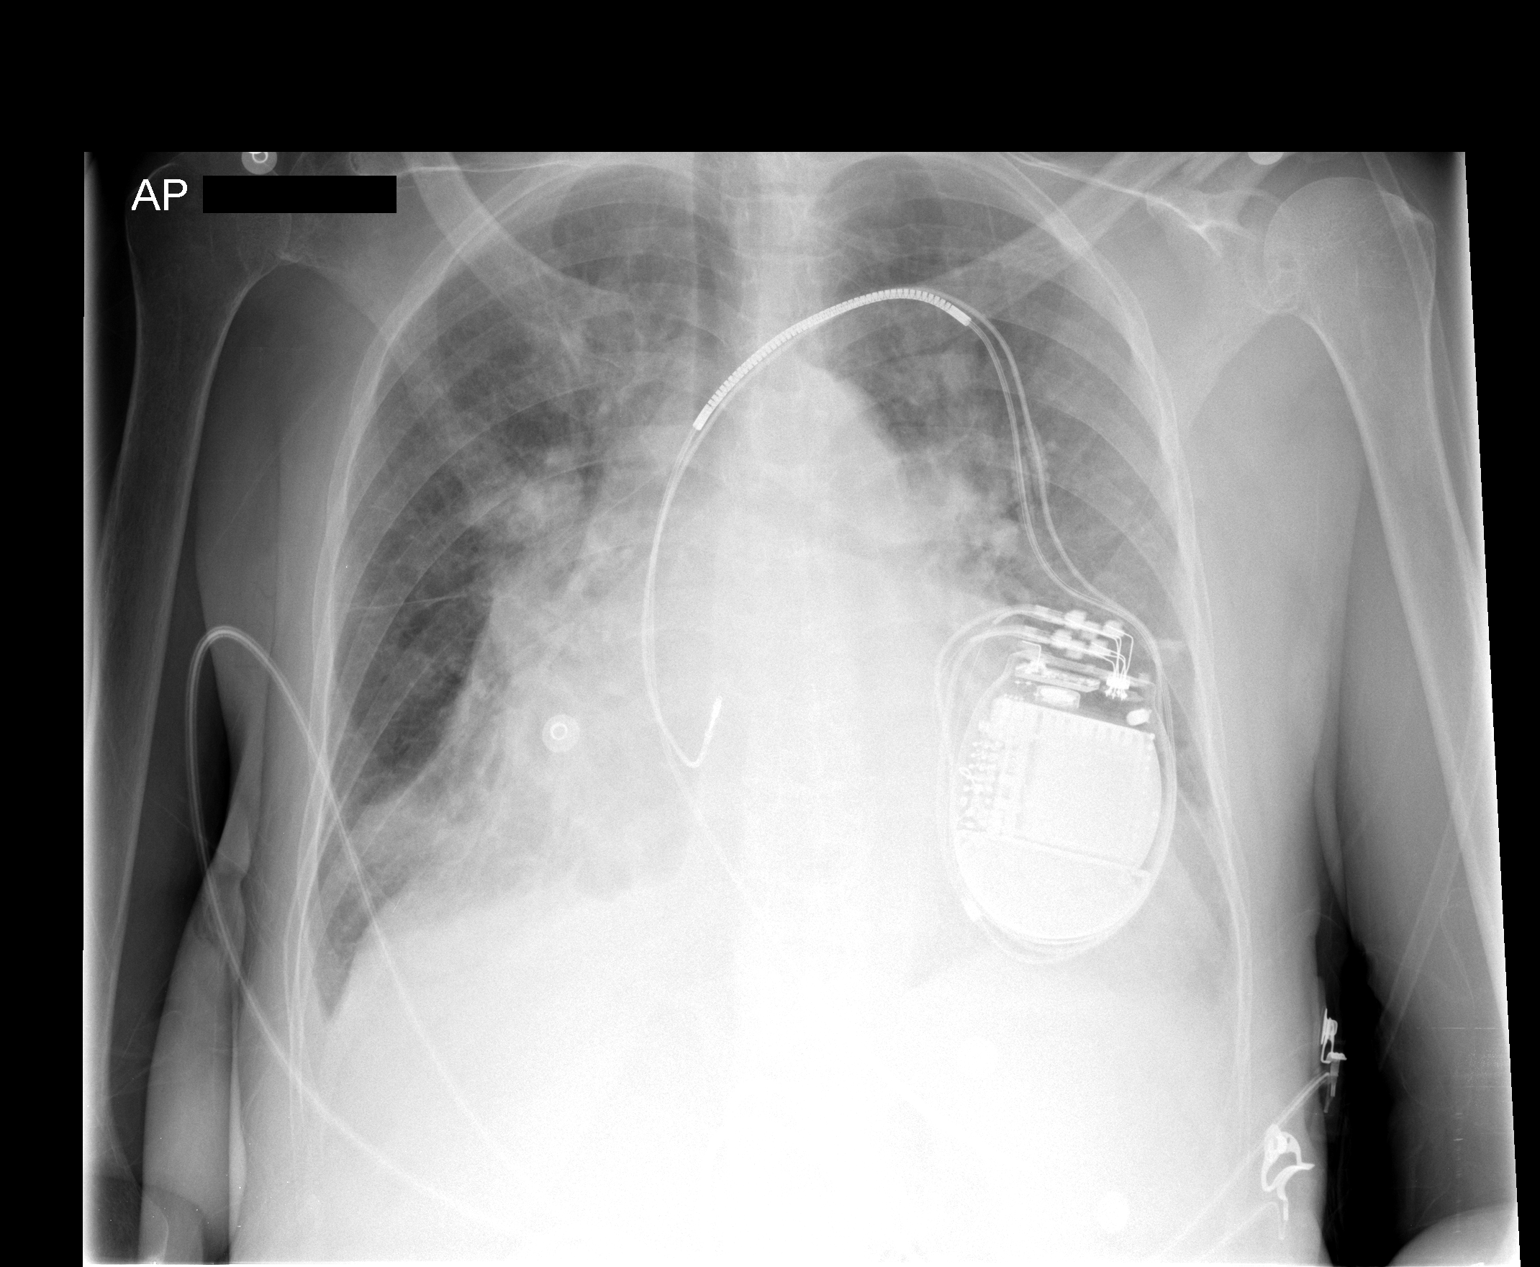

[1 of 1 positions shown; findings below may reference images not displayed]

FINDINGS: Moderate cardiomegaly is stable.  There does appear to be
pulmonary vascular congestion present with small effusions,
suggesting mild congestive heart failure.  An AICD lead is noted.
The bones are osteopenic.
IMPRESSION: Probable mild CHF with small effusions and pulmonary vascular
congestion.

## 2014-10-29 ENCOUNTER — Inpatient Hospital Stay (HOSPITAL_COMMUNITY)
Admission: EM | Admit: 2014-10-29 | Discharge: 2014-10-31 | DRG: 102 | Disposition: A | Discharge status: Home / Self Care | Payer: Medicare Other | Attending: Internal Medicine | Admitting: Internal Medicine

## 2014-10-29 ENCOUNTER — Encounter (HOSPITAL_COMMUNITY): Payer: Self-pay | Admitting: Emergency Medicine

## 2014-10-29 ENCOUNTER — Emergency Department (HOSPITAL_COMMUNITY): Payer: Medicare Other

## 2014-10-29 DIAGNOSIS — D509 Iron deficiency anemia, unspecified: Secondary | ICD-10-CM | POA: Diagnosis present

## 2014-10-29 DIAGNOSIS — E119 Type 2 diabetes mellitus without complications: Secondary | ICD-10-CM | POA: Diagnosis present

## 2014-10-29 DIAGNOSIS — R110 Nausea: Secondary | ICD-10-CM

## 2014-10-29 DIAGNOSIS — R51 Headache: Secondary | ICD-10-CM | POA: Diagnosis not present

## 2014-10-29 DIAGNOSIS — Z8249 Family history of ischemic heart disease and other diseases of the circulatory system: Secondary | ICD-10-CM

## 2014-10-29 DIAGNOSIS — Z9889 Other specified postprocedural states: Secondary | ICD-10-CM

## 2014-10-29 DIAGNOSIS — I429 Cardiomyopathy, unspecified: Secondary | ICD-10-CM | POA: Diagnosis present

## 2014-10-29 DIAGNOSIS — I4891 Unspecified atrial fibrillation: Secondary | ICD-10-CM | POA: Diagnosis present

## 2014-10-29 DIAGNOSIS — R1084 Generalized abdominal pain: Secondary | ICD-10-CM | POA: Insufficient documentation

## 2014-10-29 DIAGNOSIS — N183 Chronic kidney disease, stage 3 (moderate): Secondary | ICD-10-CM | POA: Diagnosis present

## 2014-10-29 DIAGNOSIS — Z794 Long term (current) use of insulin: Secondary | ICD-10-CM

## 2014-10-29 DIAGNOSIS — N289 Disorder of kidney and ureter, unspecified: Secondary | ICD-10-CM

## 2014-10-29 DIAGNOSIS — Z87891 Personal history of nicotine dependence: Secondary | ICD-10-CM

## 2014-10-29 DIAGNOSIS — G40909 Epilepsy, unspecified, not intractable, without status epilepticus: Secondary | ICD-10-CM | POA: Diagnosis present

## 2014-10-29 DIAGNOSIS — Z882 Allergy status to sulfonamides status: Secondary | ICD-10-CM

## 2014-10-29 DIAGNOSIS — Z95810 Presence of automatic (implantable) cardiac defibrillator: Secondary | ICD-10-CM

## 2014-10-29 DIAGNOSIS — F419 Anxiety disorder, unspecified: Secondary | ICD-10-CM | POA: Diagnosis present

## 2014-10-29 DIAGNOSIS — K5900 Constipation, unspecified: Secondary | ICD-10-CM

## 2014-10-29 DIAGNOSIS — Z91199 Patient's noncompliance with other medical treatment and regimen due to unspecified reason: Secondary | ICD-10-CM

## 2014-10-29 DIAGNOSIS — D6859 Other primary thrombophilia: Secondary | ICD-10-CM | POA: Diagnosis present

## 2014-10-29 DIAGNOSIS — Z9114 Patient's other noncompliance with medication regimen: Secondary | ICD-10-CM | POA: Diagnosis present

## 2014-10-29 DIAGNOSIS — E039 Hypothyroidism, unspecified: Secondary | ICD-10-CM | POA: Diagnosis present

## 2014-10-29 DIAGNOSIS — I472 Ventricular tachycardia: Secondary | ICD-10-CM | POA: Diagnosis present

## 2014-10-29 DIAGNOSIS — I5022 Chronic systolic (congestive) heart failure: Secondary | ICD-10-CM | POA: Diagnosis present

## 2014-10-29 DIAGNOSIS — I69951 Hemiplegia and hemiparesis following unspecified cerebrovascular disease affecting right dominant side: Secondary | ICD-10-CM

## 2014-10-29 DIAGNOSIS — Z86718 Personal history of other venous thrombosis and embolism: Secondary | ICD-10-CM

## 2014-10-29 DIAGNOSIS — Z79899 Other long term (current) drug therapy: Secondary | ICD-10-CM

## 2014-10-29 DIAGNOSIS — I69320 Aphasia following cerebral infarction: Secondary | ICD-10-CM

## 2014-10-29 DIAGNOSIS — R519 Headache, unspecified: Secondary | ICD-10-CM

## 2014-10-29 DIAGNOSIS — G8929 Other chronic pain: Secondary | ICD-10-CM | POA: Diagnosis present

## 2014-10-29 DIAGNOSIS — Z90710 Acquired absence of both cervix and uterus: Secondary | ICD-10-CM

## 2014-10-29 DIAGNOSIS — Z9049 Acquired absence of other specified parts of digestive tract: Secondary | ICD-10-CM | POA: Diagnosis present

## 2014-10-29 DIAGNOSIS — F410 Panic disorder [episodic paroxysmal anxiety] without agoraphobia: Secondary | ICD-10-CM | POA: Diagnosis present

## 2014-10-29 DIAGNOSIS — R609 Edema, unspecified: Secondary | ICD-10-CM

## 2014-10-29 DIAGNOSIS — Z9119 Patient's noncompliance with other medical treatment and regimen: Secondary | ICD-10-CM

## 2014-10-29 DIAGNOSIS — R0602 Shortness of breath: Secondary | ICD-10-CM

## 2014-10-29 DIAGNOSIS — N179 Acute kidney failure, unspecified: Secondary | ICD-10-CM | POA: Diagnosis present

## 2014-10-29 DIAGNOSIS — R569 Unspecified convulsions: Secondary | ICD-10-CM

## 2014-10-29 DIAGNOSIS — R109 Unspecified abdominal pain: Secondary | ICD-10-CM | POA: Diagnosis present

## 2014-10-29 DIAGNOSIS — R7989 Other specified abnormal findings of blood chemistry: Secondary | ICD-10-CM | POA: Insufficient documentation

## 2014-10-29 DIAGNOSIS — G9340 Encephalopathy, unspecified: Secondary | ICD-10-CM | POA: Diagnosis present

## 2014-10-29 DIAGNOSIS — Z880 Allergy status to penicillin: Secondary | ICD-10-CM

## 2014-10-29 DIAGNOSIS — J45909 Unspecified asthma, uncomplicated: Secondary | ICD-10-CM | POA: Diagnosis present

## 2014-10-29 DIAGNOSIS — Z7901 Long term (current) use of anticoagulants: Secondary | ICD-10-CM

## 2014-10-29 DIAGNOSIS — I129 Hypertensive chronic kidney disease with stage 1 through stage 4 chronic kidney disease, or unspecified chronic kidney disease: Secondary | ICD-10-CM | POA: Diagnosis present

## 2014-10-29 LAB — CBC WITH DIFFERENTIAL/PLATELET
BASOS PCT: 1 % (ref 0–1)
Basophils Absolute: 0 10*3/uL (ref 0.0–0.1)
EOS ABS: 0.1 10*3/uL (ref 0.0–0.7)
Eosinophils Relative: 1 % (ref 0–5)
HCT: 29.1 % — ABNORMAL LOW (ref 36.0–46.0)
HEMOGLOBIN: 8.6 g/dL — AB (ref 12.0–15.0)
Lymphocytes Relative: 25 % (ref 12–46)
Lymphs Abs: 1.5 10*3/uL (ref 0.7–4.0)
MCH: 22.8 pg — AB (ref 26.0–34.0)
MCHC: 29.6 g/dL — ABNORMAL LOW (ref 30.0–36.0)
MCV: 77 fL — AB (ref 78.0–100.0)
MONO ABS: 0.6 10*3/uL (ref 0.1–1.0)
Monocytes Relative: 11 % (ref 3–12)
NEUTROS ABS: 3.7 10*3/uL (ref 1.7–7.7)
Neutrophils Relative %: 63 % (ref 43–77)
Platelets: 113 10*3/uL — ABNORMAL LOW (ref 150–400)
RBC: 3.78 MIL/uL — AB (ref 3.87–5.11)
RDW: 20.5 % — ABNORMAL HIGH (ref 11.5–15.5)
WBC: 5.8 10*3/uL (ref 4.0–10.5)

## 2014-10-29 LAB — BASIC METABOLIC PANEL
Anion gap: 12 (ref 5–15)
BUN: 23 mg/dL (ref 6–23)
CALCIUM: 9.1 mg/dL (ref 8.4–10.5)
CO2: 21 mmol/L (ref 19–32)
Chloride: 106 mmol/L (ref 96–112)
Creatinine, Ser: 1.72 mg/dL — ABNORMAL HIGH (ref 0.50–1.10)
GFR calc Af Amer: 37 mL/min — ABNORMAL LOW (ref >90)
GFR calc non Af Amer: 32 mL/min — ABNORMAL LOW (ref >90)
GLUCOSE: 101 mg/dL — AB (ref 70–99)
Potassium: 3.7 mmol/L (ref 3.5–5.1)
SODIUM: 139 mmol/L (ref 135–145)

## 2014-10-29 LAB — I-STAT VENOUS BLOOD GAS, ED
ACID-BASE DEFICIT: 2 mmol/L (ref 0.0–2.0)
BICARBONATE: 23.2 meq/L (ref 20.0–24.0)
O2 Saturation: 48 %
PO2 VEN: 27 mmHg — AB (ref 30.0–45.0)
TCO2: 24 mmol/L (ref 0–100)
pCO2, Ven: 39.8 mmHg — ABNORMAL LOW (ref 45.0–50.0)
pH, Ven: 7.374 — ABNORMAL HIGH (ref 7.250–7.300)

## 2014-10-29 LAB — CBG MONITORING, ED: Glucose-Capillary: 117 mg/dL — ABNORMAL HIGH (ref 70–99)

## 2014-10-29 MED ORDER — ALPRAZOLAM 0.25 MG PO TABS
1.0000 mg | ORAL_TABLET | Freq: Once | ORAL | Status: AC
  Administered 2014-10-29: 1 mg via ORAL
  Filled 2014-10-29: qty 4

## 2014-10-29 MED ORDER — FENTANYL CITRATE 0.05 MG/ML IJ SOLN
50.0000 ug | Freq: Once | INTRAMUSCULAR | Status: AC
  Administered 2014-10-29: 50 ug via INTRAVENOUS
  Filled 2014-10-29: qty 2

## 2014-10-29 MED ORDER — SODIUM CHLORIDE 0.9 % IV BOLUS (SEPSIS)
1000.0000 mL | Freq: Once | INTRAVENOUS | Status: AC
  Administered 2014-10-29: 1000 mL via INTRAVENOUS

## 2014-10-29 MED ORDER — SODIUM CHLORIDE 0.9 % IV SOLN: Freq: Once | INTRAVENOUS | Status: DC

## 2014-10-29 MED ORDER — ACETAMINOPHEN 325 MG PO TABS
650.0000 mg | ORAL_TABLET | Freq: Once | ORAL | Status: AC
  Administered 2014-10-29: 650 mg via ORAL
  Filled 2014-10-29: qty 2

## 2014-10-29 NOTE — ED Notes (Signed)
Phlebotomy called about need for labs drawn (unsuccessful second IV)

## 2014-10-29 NOTE — ED Notes (Signed)
Per EMS: Pt from home.  Pt with a hx of previous stroke with right-sided deficits and aphasia.  Pt picked up for HA.  During transport CBG calculated 600+.  Pt also having leg swelling and EMS sts she has "not been taking her meds".  Pt unable to explain why at this time.      Pt sts she has a hx of anxiety and is requesting "anxiety medicine"

## 2014-10-29 NOTE — ED Provider Notes (Signed)
Date: 10/29/2014  Rate: 90  Rhythm: sinus rhythm with PACs  QRS Axis: normal  Intervals: PR prolonged and QT prolonged  ST/T Wave abnormalities: nonspecific ST/T changes  Conduction Disutrbances:first-degree A-V block   Narrative Interpretation:   Old EKG Reviewed: none available  EKG not available in epic for interpretation into muse  Ethelda Chick, MD 10/31/14 1408

## 2014-10-29 NOTE — ED Notes (Signed)
CBG 112.  

## 2014-10-29 NOTE — ED Notes (Signed)
Dondra Spry, NP made aware pt now has Son/Family at bedside.  Son made RN aware that pt currently out of "fluid pills" and "pain medicine".  NP made aware.

## 2014-10-30 ENCOUNTER — Observation Stay (HOSPITAL_COMMUNITY): Payer: Medicare Other

## 2014-10-30 DIAGNOSIS — J45909 Unspecified asthma, uncomplicated: Secondary | ICD-10-CM | POA: Diagnosis present

## 2014-10-30 DIAGNOSIS — D6859 Other primary thrombophilia: Secondary | ICD-10-CM | POA: Diagnosis present

## 2014-10-30 DIAGNOSIS — N183 Chronic kidney disease, stage 3 (moderate): Secondary | ICD-10-CM | POA: Diagnosis present

## 2014-10-30 DIAGNOSIS — E118 Type 2 diabetes mellitus with unspecified complications: Secondary | ICD-10-CM

## 2014-10-30 DIAGNOSIS — Z90710 Acquired absence of both cervix and uterus: Secondary | ICD-10-CM | POA: Diagnosis not present

## 2014-10-30 DIAGNOSIS — I5022 Chronic systolic (congestive) heart failure: Secondary | ICD-10-CM

## 2014-10-30 DIAGNOSIS — I69920 Aphasia following unspecified cerebrovascular disease: Secondary | ICD-10-CM

## 2014-10-30 DIAGNOSIS — R1084 Generalized abdominal pain: Secondary | ICD-10-CM | POA: Diagnosis not present

## 2014-10-30 DIAGNOSIS — I472 Ventricular tachycardia: Secondary | ICD-10-CM | POA: Diagnosis present

## 2014-10-30 DIAGNOSIS — Z882 Allergy status to sulfonamides status: Secondary | ICD-10-CM | POA: Diagnosis not present

## 2014-10-30 DIAGNOSIS — G8929 Other chronic pain: Secondary | ICD-10-CM | POA: Diagnosis present

## 2014-10-30 DIAGNOSIS — F410 Panic disorder [episodic paroxysmal anxiety] without agoraphobia: Secondary | ICD-10-CM | POA: Diagnosis present

## 2014-10-30 DIAGNOSIS — I429 Cardiomyopathy, unspecified: Secondary | ICD-10-CM | POA: Diagnosis present

## 2014-10-30 DIAGNOSIS — Z95810 Presence of automatic (implantable) cardiac defibrillator: Secondary | ICD-10-CM | POA: Diagnosis not present

## 2014-10-30 DIAGNOSIS — Z9049 Acquired absence of other specified parts of digestive tract: Secondary | ICD-10-CM | POA: Diagnosis present

## 2014-10-30 DIAGNOSIS — G9340 Encephalopathy, unspecified: Secondary | ICD-10-CM

## 2014-10-30 DIAGNOSIS — N289 Disorder of kidney and ureter, unspecified: Secondary | ICD-10-CM | POA: Insufficient documentation

## 2014-10-30 DIAGNOSIS — R569 Unspecified convulsions: Secondary | ICD-10-CM

## 2014-10-30 DIAGNOSIS — Z880 Allergy status to penicillin: Secondary | ICD-10-CM | POA: Diagnosis not present

## 2014-10-30 DIAGNOSIS — Z79899 Other long term (current) drug therapy: Secondary | ICD-10-CM | POA: Diagnosis not present

## 2014-10-30 DIAGNOSIS — E119 Type 2 diabetes mellitus without complications: Secondary | ICD-10-CM | POA: Diagnosis present

## 2014-10-30 DIAGNOSIS — N179 Acute kidney failure, unspecified: Secondary | ICD-10-CM | POA: Diagnosis present

## 2014-10-30 DIAGNOSIS — Z794 Long term (current) use of insulin: Secondary | ICD-10-CM | POA: Diagnosis not present

## 2014-10-30 DIAGNOSIS — Z86718 Personal history of other venous thrombosis and embolism: Secondary | ICD-10-CM | POA: Diagnosis not present

## 2014-10-30 DIAGNOSIS — I69320 Aphasia following cerebral infarction: Secondary | ICD-10-CM | POA: Diagnosis not present

## 2014-10-30 DIAGNOSIS — R110 Nausea: Secondary | ICD-10-CM | POA: Insufficient documentation

## 2014-10-30 DIAGNOSIS — Z8249 Family history of ischemic heart disease and other diseases of the circulatory system: Secondary | ICD-10-CM | POA: Diagnosis not present

## 2014-10-30 DIAGNOSIS — Z9889 Other specified postprocedural states: Secondary | ICD-10-CM | POA: Diagnosis not present

## 2014-10-30 DIAGNOSIS — I4891 Unspecified atrial fibrillation: Secondary | ICD-10-CM

## 2014-10-30 DIAGNOSIS — D509 Iron deficiency anemia, unspecified: Secondary | ICD-10-CM | POA: Diagnosis present

## 2014-10-30 DIAGNOSIS — E039 Hypothyroidism, unspecified: Secondary | ICD-10-CM | POA: Diagnosis present

## 2014-10-30 DIAGNOSIS — R51 Headache: Secondary | ICD-10-CM | POA: Diagnosis present

## 2014-10-30 DIAGNOSIS — Z7901 Long term (current) use of anticoagulants: Secondary | ICD-10-CM | POA: Diagnosis not present

## 2014-10-30 DIAGNOSIS — Z9119 Patient's noncompliance with other medical treatment and regimen: Secondary | ICD-10-CM

## 2014-10-30 DIAGNOSIS — G40909 Epilepsy, unspecified, not intractable, without status epilepticus: Secondary | ICD-10-CM | POA: Diagnosis present

## 2014-10-30 DIAGNOSIS — F419 Anxiety disorder, unspecified: Secondary | ICD-10-CM | POA: Diagnosis present

## 2014-10-30 DIAGNOSIS — Z9114 Patient's other noncompliance with medication regimen: Secondary | ICD-10-CM | POA: Diagnosis present

## 2014-10-30 DIAGNOSIS — I129 Hypertensive chronic kidney disease with stage 1 through stage 4 chronic kidney disease, or unspecified chronic kidney disease: Secondary | ICD-10-CM | POA: Diagnosis present

## 2014-10-30 DIAGNOSIS — R109 Unspecified abdominal pain: Secondary | ICD-10-CM | POA: Diagnosis present

## 2014-10-30 DIAGNOSIS — Z87891 Personal history of nicotine dependence: Secondary | ICD-10-CM | POA: Diagnosis not present

## 2014-10-30 DIAGNOSIS — I69951 Hemiplegia and hemiparesis following unspecified cerebrovascular disease affecting right dominant side: Secondary | ICD-10-CM | POA: Diagnosis not present

## 2014-10-30 LAB — CBC WITH DIFFERENTIAL/PLATELET
BASOS ABS: 0 10*3/uL (ref 0.0–0.1)
BASOS PCT: 0 % (ref 0–1)
EOS ABS: 0.1 10*3/uL (ref 0.0–0.7)
Eosinophils Relative: 1 % (ref 0–5)
HCT: 26.9 % — ABNORMAL LOW (ref 36.0–46.0)
Hemoglobin: 8 g/dL — ABNORMAL LOW (ref 12.0–15.0)
Lymphocytes Relative: 24 % (ref 12–46)
Lymphs Abs: 1.2 10*3/uL (ref 0.7–4.0)
MCH: 22.7 pg — ABNORMAL LOW (ref 26.0–34.0)
MCHC: 29.7 g/dL — ABNORMAL LOW (ref 30.0–36.0)
MCV: 76.2 fL — ABNORMAL LOW (ref 78.0–100.0)
Monocytes Absolute: 0.5 10*3/uL (ref 0.1–1.0)
Monocytes Relative: 9 % (ref 3–12)
NEUTROS ABS: 3.1 10*3/uL (ref 1.7–7.7)
NEUTROS PCT: 65 % (ref 43–77)
Platelets: 99 10*3/uL — ABNORMAL LOW (ref 150–400)
RBC: 3.53 MIL/uL — AB (ref 3.87–5.11)
RDW: 20.5 % — ABNORMAL HIGH (ref 11.5–15.5)
WBC: 4.9 10*3/uL (ref 4.0–10.5)

## 2014-10-30 LAB — COMPREHENSIVE METABOLIC PANEL
ALBUMIN: 2.8 g/dL — AB (ref 3.5–5.2)
ALT: 8 U/L (ref 0–35)
ANION GAP: 8 (ref 5–15)
AST: 19 U/L (ref 0–37)
Alkaline Phosphatase: 52 U/L (ref 39–117)
BUN: 25 mg/dL — AB (ref 6–23)
CALCIUM: 8.8 mg/dL (ref 8.4–10.5)
CO2: 22 mmol/L (ref 19–32)
CREATININE: 1.75 mg/dL — AB (ref 0.50–1.10)
Chloride: 107 mmol/L (ref 96–112)
GFR calc Af Amer: 36 mL/min — ABNORMAL LOW (ref >90)
GFR calc non Af Amer: 31 mL/min — ABNORMAL LOW (ref >90)
Glucose, Bld: 91 mg/dL (ref 70–99)
Potassium: 3.8 mmol/L (ref 3.5–5.1)
Sodium: 137 mmol/L (ref 135–145)
Total Bilirubin: 1.8 mg/dL — ABNORMAL HIGH (ref 0.3–1.2)
Total Protein: 7.3 g/dL (ref 6.0–8.3)

## 2014-10-30 LAB — URINALYSIS W MICROSCOPIC (NOT AT ARMC)
Glucose, UA: NEGATIVE mg/dL
Hgb urine dipstick: NEGATIVE
KETONES UR: NEGATIVE mg/dL
LEUKOCYTES UA: NEGATIVE
Nitrite: NEGATIVE
PH: 5 (ref 5.0–8.0)
Protein, ur: NEGATIVE mg/dL
Specific Gravity, Urine: 1.025 (ref 1.005–1.030)
UROBILINOGEN UA: 1 mg/dL (ref 0.0–1.0)

## 2014-10-30 LAB — HEPATIC FUNCTION PANEL
ALBUMIN: 2.9 g/dL — AB (ref 3.5–5.2)
ALT: 7 U/L (ref 0–35)
AST: 20 U/L (ref 0–37)
Alkaline Phosphatase: 54 U/L (ref 39–117)
Bilirubin, Direct: 0.9 mg/dL — ABNORMAL HIGH (ref 0.0–0.5)
Indirect Bilirubin: 1.3 mg/dL — ABNORMAL HIGH (ref 0.3–0.9)
TOTAL PROTEIN: 7.5 g/dL (ref 6.0–8.3)
Total Bilirubin: 2.2 mg/dL — ABNORMAL HIGH (ref 0.3–1.2)

## 2014-10-30 LAB — FIBRINOGEN: FIBRINOGEN: 136 mg/dL — AB (ref 204–475)

## 2014-10-30 LAB — PROTIME-INR
INR: 6.1 (ref 0.00–1.49)
INR: 5.36 — AB (ref 0.00–1.49)
PROTHROMBIN TIME: 49.4 s — AB (ref 11.6–15.2)
Prothrombin Time: 54.6 seconds — ABNORMAL HIGH (ref 11.6–15.2)

## 2014-10-30 LAB — TSH: TSH: 7.371 u[IU]/mL — ABNORMAL HIGH (ref 0.350–4.500)

## 2014-10-30 LAB — APTT: aPTT: 51 seconds — ABNORMAL HIGH (ref 24–37)

## 2014-10-30 LAB — BRAIN NATRIURETIC PEPTIDE: B Natriuretic Peptide: 2965.9 pg/mL — ABNORMAL HIGH (ref 0.0–100.0)

## 2014-10-30 MED ORDER — PANTOPRAZOLE SODIUM 40 MG PO TBEC
40.0000 mg | DELAYED_RELEASE_TABLET | Freq: Every day | ORAL | Status: DC
Start: 2014-10-30 — End: 2014-10-31
  Administered 2014-10-30 – 2014-10-31 (×2): 40 mg via ORAL
  Filled 2014-10-30 (×2): qty 1

## 2014-10-30 MED ORDER — LEVETIRACETAM 750 MG PO TABS
2000.0000 mg | ORAL_TABLET | Freq: Two times a day (BID) | ORAL | Status: DC
  Administered 2014-10-30 (×3): 2000 mg via ORAL
  Administered 2014-10-31: 500 mg via ORAL
  Filled 2014-10-30 (×5): qty 1

## 2014-10-30 MED ORDER — FUROSEMIDE 10 MG/ML IJ SOLN
20.0000 mg | Freq: Once | INTRAMUSCULAR | Status: AC
  Administered 2014-10-30: 20 mg via INTRAVENOUS
  Filled 2014-10-30: qty 2

## 2014-10-30 MED ORDER — ALPRAZOLAM 0.5 MG PO TABS
0.5000 mg | ORAL_TABLET | Freq: Three times a day (TID) | ORAL | Status: DC | PRN
  Administered 2014-10-30: 0.5 mg via ORAL
  Filled 2014-10-30: qty 1

## 2014-10-30 MED ORDER — LINACLOTIDE 145 MCG PO CAPS
145.0000 ug | ORAL_CAPSULE | Freq: Every day | ORAL | Status: DC
  Administered 2014-10-31: 145 ug via ORAL
  Filled 2014-10-30 (×2): qty 1

## 2014-10-30 MED ORDER — OXYCODONE-ACETAMINOPHEN 5-325 MG PO TABS
1.0000 | ORAL_TABLET | Freq: Four times a day (QID) | ORAL | Status: DC | PRN
  Administered 2014-10-30: 1 via ORAL
  Filled 2014-10-30 (×4): qty 1

## 2014-10-30 MED ORDER — QUETIAPINE FUMARATE ER 50 MG PO TB24
150.0000 mg | ORAL_TABLET | Freq: Every day | ORAL | Status: DC
  Administered 2014-10-30: 150 mg via ORAL
  Filled 2014-10-30 (×2): qty 3

## 2014-10-30 MED ORDER — OXYCODONE-ACETAMINOPHEN 10-325 MG PO TABS: 1.0000 | ORAL_TABLET | Freq: Four times a day (QID) | ORAL | Status: DC | PRN

## 2014-10-30 MED ORDER — ONDANSETRON HCL 4 MG PO TABS: 4.0000 mg | ORAL_TABLET | Freq: Four times a day (QID) | ORAL | Status: DC | PRN

## 2014-10-30 MED ORDER — ACETAMINOPHEN 650 MG RE SUPP: 650.0000 mg | Freq: Four times a day (QID) | RECTAL | Status: DC | PRN

## 2014-10-30 MED ORDER — SPIRONOLACTONE 25 MG PO TABS
25.0000 mg | ORAL_TABLET | Freq: Every day | ORAL | Status: DC
Start: 2014-10-30 — End: 2014-10-31
  Administered 2014-10-30 – 2014-10-31 (×2): 25 mg via ORAL
  Filled 2014-10-30 (×2): qty 1

## 2014-10-30 MED ORDER — OXYCODONE HCL 5 MG PO TABS
5.0000 mg | ORAL_TABLET | Freq: Four times a day (QID) | ORAL | Status: DC | PRN
  Administered 2014-10-30: 5 mg via ORAL
  Filled 2014-10-30 (×2): qty 1

## 2014-10-30 MED ORDER — AMIODARONE HCL 200 MG PO TABS
200.0000 mg | ORAL_TABLET | ORAL | Status: DC
  Administered 2014-10-30: 200 mg via ORAL
  Filled 2014-10-30: qty 1

## 2014-10-30 MED ORDER — SODIUM CHLORIDE 0.9 % IJ SOLN
3.0000 mL | Freq: Two times a day (BID) | INTRAMUSCULAR | Status: DC
  Administered 2014-10-30 – 2014-10-31 (×3): 3 mL via INTRAVENOUS

## 2014-10-30 MED ORDER — ONDANSETRON HCL 4 MG/2ML IJ SOLN: 4.0000 mg | Freq: Four times a day (QID) | INTRAMUSCULAR | Status: DC | PRN

## 2014-10-30 MED ORDER — POLYETHYLENE GLYCOL 3350 17 G PO PACK
17.0000 g | PACK | Freq: Every day | ORAL | Status: DC | PRN
  Filled 2014-10-30: qty 1

## 2014-10-30 MED ORDER — MORPHINE SULFATE 2 MG/ML IJ SOLN
2.0000 mg | Freq: Once | INTRAMUSCULAR | Status: AC
  Administered 2014-10-30: 2 mg via INTRAVENOUS
  Filled 2014-10-30: qty 1

## 2014-10-30 MED ORDER — LEVOTHYROXINE SODIUM 50 MCG PO TABS
50.0000 ug | ORAL_TABLET | Freq: Every day | ORAL | Status: DC
  Filled 2014-10-30 (×3): qty 1

## 2014-10-30 MED ORDER — ACETAMINOPHEN 325 MG PO TABS: 650.0000 mg | ORAL_TABLET | Freq: Four times a day (QID) | ORAL | Status: DC | PRN

## 2014-10-30 MED ORDER — RIVAROXABAN 15 MG PO TABS
15.0000 mg | ORAL_TABLET | Freq: Every day | ORAL | Status: DC
  Filled 2014-10-30 (×2): qty 1

## 2014-10-30 MED ORDER — FENTANYL CITRATE 0.05 MG/ML IJ SOLN
25.0000 ug | INTRAMUSCULAR | Status: DC | PRN
  Administered 2014-10-30 (×2): 25 ug via INTRAVENOUS
  Filled 2014-10-30 (×2): qty 2

## 2014-10-30 NOTE — Progress Notes (Addendum)
ANTICOAGULATION CONSULT NOTE - Initial Consult  Pharmacy Consult for Xarelto  Indication: atrial fibrillation  Allergies  Allergen Reactions  . Penicillins Rash  . Sulfa Antibiotics Rash    Patient Measurements: Height: 4' 11.84" (152 cm) Weight: 124 lb 5.4 oz (56.4 kg) IBW/kg (Calculated) : 45.14  Vital Signs: Temp: 98.2 F (36.8 C) (02/12 0348) Temp Source: Oral (02/12 0348) BP: 104/68 mmHg (02/12 0348) Pulse Rate: 82 (02/12 0348)  Labs:  Recent Labs  10/29/14 2250 10/30/14 0102 10/30/14 0547  HGB 8.6*  --  8.0*  HCT 29.1*  --  26.9*  PLT 113*  --  99*  APTT  --  51*  --   LABPROT  --  54.6*  --   INR  --  6.10*  --   CREATININE 1.72*  --   --     Estimated Creatinine Clearance: 28.6 mL/min (by C-G formula based on Cr of 1.72).   Medical History: Past Medical History  Diagnosis Date  . Seizures   . Hypertension   . Stroke     2007 - R sided weakness and expressive aphasia with h/o behavioral problems related to this  . Asthma   . Diabetes mellitus   . Chronic systolic CHF (congestive heart failure)   . Atrial fibrillation     a. On Xarelto after having DVT (prev felt to be poor anticoag cand 2/2 noncompliance).  . History of DVT (deep vein thrombosis)     a. 06/2012: RLE DVT, + protein C deficiency, placed on Xarelto. s/p IVC filter  . Noncompliance     Due to mental status, family has had to coax her to take medicines  . Hypothyroidism   . VT (ventricular tachycardia)     a. h/o ICD ~2007-2008. b. Adm 08/2012 with UTI, had VT/ICD shock x 5 (hypokalemic);  c. 06/2013 s/p ICD Gen change: SJM DC ICD ser #: [id number]  . Protein C deficiency   . NICM (nonischemic cardiomyopathy)     a. s/p AICD ~2008 ([employer]) - previous care [place]. No known hx CAD. b. EF 10% by echo 04/2012;  c. 06/2013 s/p ICD Gen change: SJM DC ICD ser #: [id number]  . 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 Panic attacks   . Anxiety     Medications:  Prescriptions prior to  admission  Medication Sig Dispense Refill Last Dose  . acetaminophen (TYLENOL) 325 MG tablet Take 650 mg by mouth every 6 (six) hours as needed for pain.   Past Week at Unknown time  . albuterol (PROVENTIL) (2.5 MG/3ML) 0.083% nebulizer solution Take 2.5 mg by nebulization every 6 (six) hours as needed. For cough or wheeze   Past Week at Unknown time  . ALPRAZolam (XANAX) 1 MG tablet Take 1 tablet (1 mg total) by mouth at bedtime as needed for anxiety. 30 tablet 0 Past Week at Unknown time  . amiodarone (PACERONE) 200 MG tablet Take 200 mg by mouth See admin instructions. Takes  on M,T,W,Th,F only Skips doses on Sat and Sun   Past Week at Unknown time  . furosemide (LASIX) 40 MG tablet Take 1 tablet (40 mg total) by mouth 2 (two) times daily. 60 tablet 4 Past Week at Unknown time  . insulin lispro (HUMALOG) 100 UNIT/ML injection Inject into the skin 3 (three) times daily before meals. On sliding scale   Past Week at Unknown time  . iron polysaccharides (NIFEREX) 150 MG capsule Take 1 capsule (  150 mg total) by mouth daily. 30 capsule 5 Past Week at Unknown time  . levETIRAcetam (KEPPRA) 1000 MG tablet Take 2,000 mg by mouth 2 (two) times daily.   Past Week at Unknown time  . levothyroxine (SYNTHROID, LEVOTHROID) 50 MCG tablet Take 50 mcg by mouth daily before breakfast.   Past Week at Unknown time  . Linaclotide (LINZESS) 145 MCG CAPS capsule Take 145 mcg by mouth daily.   Past Week at Unknown time  . lisinopril (PRINIVIL,ZESTRIL) 2.5 MG tablet Take 1 tablet (2.5 mg total) by mouth daily. 30 tablet 3 Past Week at Unknown time  . metolazone (ZAROXOLYN) 2.5 MG tablet Take 1 tablet (2.5 mg total) by mouth daily. 30 tablet 0   . oxyCODONE-acetaminophen (PERCOCET) 10-325 MG per tablet Take 1 tablet by mouth every 6 (six) hours as needed for pain. 15 tablet 0   . pantoprazole (PROTONIX) 40 MG tablet Take 1 tablet (40 mg total) by mouth daily. 30 tablet 5 Past Week at Unknown time  . polyethylene  glycol (MIRALAX / GLYCOLAX) packet Take 17 g by mouth daily as needed (constipation).    Past Week at Unknown time  . QUEtiapine Fumarate (SEROQUEL XR) 150 MG 24 hr tablet Take 1 tablet (150 mg total) by mouth at bedtime. 30 tablet 0 Past Week at Unknown time  . Rivaroxaban (XARELTO) 20 MG TABS tablet Take 20 mg by mouth daily.   Past Week at Unknown time  . spironolactone (ALDACTONE) 25 MG tablet Take 25 mg by mouth daily.   Past Week at Unknown time    Assessment: 84 YOF on Xarelto 20 mg daily pta for AFib admitted with chronic headache and abdominal pain. Patient's SCr on admission is elevated at 1.72 (BL ~ 1). CrCl ~ 30-35 mL/min. H/H low. Platelets down to 99 (BL > 200).   Goal of Therapy:  Stroke prevention  Monitor platelets by anticoagulation protocol: Yes   Plan:  -Xarelto 15 mg daily till renal fx improves. If renal fx worsens, may need to consider IV heparin if anticoagulation is needed.  -Unsure when last dose of Xarelto was. Enter dose after medication reconciliation complete  -Increase back to home Xarelto dose when SCr is back at baseline.  -Monitor for s/s bleeding   Vinnie Level, PharmD., BCPS Clinical Pharmacist Pager [phone]

## 2014-10-30 NOTE — Progress Notes (Signed)
CRITICAL VALUE ALERT  Critical value received:  INR=6.10  Date of notification: 10/30/14  Time of notification: 0205  Critical value read back: yes  Nurse who received alert: G. Scorpio Fortin  MD notified (1st page): Dr. Allena Katz  Time of first page:  0220  MD notified (2nd page):  Time of second page:  Responding MD: Dr. Allena Katz  Time MD responded:  [phone]

## 2014-10-30 NOTE — Progress Notes (Signed)
Unable to do admission history, pt is aphasic, no family member at bedside.

## 2014-10-30 NOTE — Progress Notes (Addendum)
[facility]                                                                              Patient Demographics  Elizabeth Love, is a 59 y.o. female, DOB - 02/12/1956, ZOX:[id number]  Admit date - 10/29/2014   Admitting Physician Lynden Oxford, MD  Outpatient Primary MD for the patient is No primary care provider on file.  LOS - 0   Chief Complaint  Patient presents with  . Chest Pain  . Headache      HPI on 10/30/2014 by Dr. Lynden Oxford Elizabeth Love is a 59 y.o. 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 fib on Xarelto, noncompliance with medical treatment, hypothyroidism, cardiomyopathy. The patient is presenting with complaints of abdominal pain as well as headache. Patient mentions she has chronic headache as well as chronic abdominal pain. She denies any chest pain denies any focal deficit denies any diarrhea or constipation denies any burning urination denies any cough. As per patient's son she has ran out of her medications specifically pain medications and Lasix since last one month. She has been taking all other medications. She has been on any medication for mood disorder but the son does not remember that and other than that there is no change in her medication. Patient has been taking all her medications regularly as per son.  Assessment & Plan   Acute Encephalopathy -Unknown etiology, however, patient is at her baseline, according to her brother -It seems that according to the prior, patient becomes agitated when she does not get her way. She was brought to the hospital for questionable neck swelling and ankle pain that she supposedly broke 6 months ago. -CT of the head was negative, chest x-ray shows no infection, UA pending -Patient is afebrile with no leukocytosis -Patient complains of generalized pain however seems to have a history of chronic  pain  Chronic systolic heart failure -BNP appears to be at baseline, chest x-ray showed some mild congestion -Patient has no crackles on exam -She was given 1 dose of IV Lasix at admission -Continue to monitor daily weights, intake and output -Continue home medications  Acute kidney injury -Will continue to monitor -patient received lasix at admission  History of seizure disorder -Continue Keppra and Lamictal  Atrial fibrillation/history of DVT/protein C deficiency/history of CVA -Continue amiodarone, Xarelto  Chronic pain -Patient is headache as well as abdominal pain -X-ray of the abdomen does not show any evidence of bowel obstruction, patient wants to eat. -Speech consulted and recommended dysphagia 3 diet -Patient refusing her by mouth medications and request fentanyl and has asked for this several times within one hour.  Hypothyroidism -TSH 7.371, will continue Synthroid -Patient will need to follow-up with her primary care physician  Code Status: Full  Family Communication: Brother via phone  Disposition Plan: Admitted  Time Spent in minutes   30 minutes  Procedures  None  Consults   None  DVT Prophylaxis  Xarelto  Lab Results  Component Value Date   PLT 99* 10/30/2014    Medications  Scheduled Meds: . amiodarone  200 mg  Oral Once per day on Mon Tue Wed Thu Fri  . levETIRAcetam  2,000 mg Oral BID  . levothyroxine  50 mcg Oral QAC breakfast  . Linaclotide  145 mcg Oral Daily  . pantoprazole  40 mg Oral Daily  . QUEtiapine Fumarate  150 mg Oral QHS  . rivaroxaban  15 mg Oral Q supper  . sodium chloride  3 mL Intravenous Q12H  . spironolactone  25 mg Oral Daily   Continuous Infusions:  PRN Meds:.acetaminophen **OR** acetaminophen, ALPRAZolam, fentaNYL, ondansetron **OR** ondansetron (ZOFRAN) IV, oxyCODONE-acetaminophen **AND** oxyCODONE, polyethylene glycol  Antibiotics    Anti-infectives    None      Subjective:   Elizabeth Love seen and  examined today.  Patient minimally verbal secondary to her stroke. She does point to her head as well as her stomach and ankles when asked about pain.  Objective:   Filed Vitals:   10/30/14 0220 10/30/14 0348 10/30/14 0500 10/30/14 1300  BP: 107/95 104/68  97/73  Pulse: 82 82  91  Temp: 97.6 F (36.4 C) 98.2 F (36.8 C)  97.9 F (36.6 C)  TempSrc: Oral Oral  Oral  Resp: Height:   4' 11.84" (1.52 m)   Weight:  56.4 kg (124 lb 5.4 oz)    SpO2: 100% 98%  100%    Wt Readings from Last 3 Encounters:  10/30/14 56.4 kg (124 lb 5.4 oz)  04/17/14 49.85 kg (109 lb 14.4 oz)  03/24/14 48.58 kg (107 lb 1.6 oz)     Intake/Output Summary (Last 24 hours) at 10/30/14 1407 Last data filed at 10/30/14 0749  Gross per 24 hour  Intake    250 ml  Output      0 ml  Net    250 ml    Exam  General: Well developed, well nourished, NAD, appears stated age  HEENT: NCAT, mucous membranes moist.   Cardiovascular: S1 S2 auscultated, no rubs, murmurs or gallops. RRR  Respiratory: Clear to auscultation bilaterally with equal chest rise  Abdomen: Soft, nontender, nondistended, + bowel sounds  Extremities: warm dry without cyanosis clubbing or edema  Data Review   Micro Results No results found for this or any previous visit (from the past 240 hour(s)).  Radiology Reports Ct Head Wo Contrast  10/29/2014   CLINICAL DATA:  Previous stroke. Right-sided deficits and a aphasia. Now complains of headache and leg swelling.  EXAM: CT HEAD WITHOUT CONTRAST  TECHNIQUE: Contiguous axial images were obtained from the base of the skull through the vertex without intravenous contrast.  COMPARISON:  04/15/2014  FINDINGS: Large area of encephalomalacia in the distribution of the left middle cerebral artery consistent with old infarct. No change since prior study. Ex vacuo dilatation of the left lateral ventricles. Diffuse cerebral atrophy. No abnormal extra-axial fluid collections. Basal cisterns are  not effaced. No mass effect or midline shift no evidence of acute intracranial hemorrhage. Calvarium appears intact. Vascular calcifications. Visualized paranasal sinuses and mastoid air cells are not opacified.  IMPRESSION: Old left MCA distribution infarct. Chronic atrophy. No acute intracranial abnormalities.   Electronically Signed   By: Burman Nieves M.D.   On: 10/29/2014 23:43   Dg Chest Port 1 View  10/30/2014   CLINICAL DATA:  Shortness of breath. History of stroke, CHF, asthma, diabetes.  EXAM: PORTABLE CHEST - 1 VIEW  COMPARISON:  04/15/2014  FINDINGS: Cardiac pacemaker. Diffuse cardiac enlargement with mild pulmonary vascular congestion. Vascular congestion is improved since prior  study. No definite infiltration or consolidation. Small right pleural effusion with fluid in the right minor fissure. No pneumothorax.  IMPRESSION: Diffuse cardiac enlargement. Mild pulmonary vascular congestion. Right pleural effusion with fluid in the fissure.   Electronically Signed   By: Burman Nieves M.D.   On: 10/30/2014 01:15   Dg Abd 2 Views  10/30/2014   CLINICAL DATA:  Abdominal pain and constipation.  Nausea.  EXAM: ABDOMEN - 2 VIEW  COMPARISON:  03/21/2014  FINDINGS: Scattered gas and stool in the colon. No small or large bowel distention. No free intra-abdominal air. No abnormal air-fluid levels. Surgical clips in the right upper quadrant. Inferior vena caval filter present. Visualized lower chest demonstrates cardiac pacemaker. Diffuse cardiac enlargement. Probable small bilateral pleural effusions. Visualized bones appear intact. No radiopaque stones.  IMPRESSION: Normal bowel gas pattern. No evidence of bowel obstruction. Incidental note of cardiac enlargement.   Electronically Signed   By: Burman Nieves M.D.   On: 10/30/2014 01:58    CBC  Recent Labs Lab 10/29/14 2250 10/30/14 0547  WBC 5.8 4.9  HGB 8.6* 8.0*  HCT 29.1* 26.9*  PLT 113* 99*  MCV 77.0* 76.2*  MCH 22.8* 22.7*  MCHC  29.6* 29.7*  RDW 20.5* 20.5*  LYMPHSABS 1.5 1.2  MONOABS 0.6 0.5  EOSABS 0.1 0.1  BASOSABS 0.0 0.0    Chemistries   Recent Labs Lab 10/29/14 2250 10/30/14 0102 10/30/14 0547  NA 139  --  137  K 3.7  --  3.8  CL 106  --  107  CO2 21  --  22  GLUCOSE 101*  --  91  BUN 23  --  25*  CREATININE 1.72*  --  1.75*  CALCIUM 9.1  --  8.8  AST  --  20 19  ALT  --  7 8  ALKPHOS  --  54 52  BILITOT  --  2.2* 1.8*   ------------------------------------------------------------------------------------------------------------------ estimated creatinine clearance is 28.1 mL/min (by C-G formula based on Cr of 1.75). ------------------------------------------------------------------------------------------------------------------ No results for input(s): HGBA1C in the last 72 hours. ------------------------------------------------------------------------------------------------------------------ No results for input(s): CHOL, HDL, LDLCALC, TRIG, CHOLHDL, LDLDIRECT in the last 72 hours. ------------------------------------------------------------------------------------------------------------------  Recent Labs  10/30/14 0102  TSH 7.371*   ------------------------------------------------------------------------------------------------------------------ No results for input(s): VITAMINB12, FOLATE, FERRITIN, TIBC, IRON, RETICCTPCT in the last 72 hours.  Coagulation profile  Recent Labs Lab 10/30/14 0102 10/30/14 0547  INR 6.10* 5.36*    No results for input(s): DDIMER in the last 72 hours.  Cardiac Enzymes No results for input(s): CKMB, TROPONINI, MYOGLOBIN in the last 168 hours.  Invalid input(s): CK ------------------------------------------------------------------------------------------------------------------ Invalid input(s): POCBNP    Broghan Pannone D.O. on 10/30/2014 at 2:07 PM  Between 7am to 7pm - Pager - [phone]  After 7pm go to [email or web address] - password  [email or web address]  And look for the night coverage person covering for me after hours  [facility] Office  [phone]

## 2014-10-30 NOTE — Progress Notes (Signed)
Admitted pt to rm 2W33 from ED, pt is aphasic from an old stroke, oriented to room, call bell placed within reach.    10/30/14 0220  Vitals  Temp 97.6 F (36.4 C)  Temp Source Oral  BP (!) 107/95 mmHg  BP Location Right Arm  BP Method Automatic  Patient Position (if appropriate) Lying  Pulse Rate 82  Pulse Rate Source Dinamap  Resp 20  Oxygen Therapy  SpO2 100 %  O2 Device Room Air

## 2014-10-30 NOTE — Evaluation (Signed)
Clinical/Bedside Swallow Evaluation Patient Details  Name: Elizabeth Love MRN: [id number] Date of Birth: 1955/10/08  Today's Date: 10/30/2014 Time: SLP Start Time (ACUTE ONLY): 1121 SLP Stop Time (ACUTE ONLY): 1140 SLP Time Calculation (min) (ACUTE ONLY): 19 min  Past Medical History:  Past Medical History  Diagnosis Date  . Seizures   . Hypertension   . Stroke     2007 - R sided weakness and expressive aphasia with h/o behavioral problems related to this  . Asthma   . Diabetes mellitus   . Chronic systolic CHF (congestive heart failure)   . Atrial fibrillation     a. On Xarelto after having DVT (prev felt to be poor anticoag cand 2/2 noncompliance).  . History of DVT (deep vein thrombosis)     a. 06/2012: RLE DVT, + protein C deficiency, placed on Xarelto. s/p IVC filter  . Noncompliance     Due to mental status, family has had to coax her to take medicines  . Hypothyroidism   . VT (ventricular tachycardia)     a. h/o ICD ~2007-2008. b. Adm 08/2012 with UTI, had VT/ICD shock x 5 (hypokalemic);  c. 06/2013 s/p ICD Gen change: SJM DC ICD ser #: [id number]  . Protein C deficiency   . NICM (nonischemic cardiomyopathy)     a. s/p AICD ~2008 ([employer]) - previous care [place]. No known hx CAD. b. EF 10% by echo 04/2012;  c. 06/2013 s/p ICD Gen change: SJM DC ICD ser #: [id number]  . 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 Panic attacks   . Anxiety    Past Surgical History:  Past Surgical History  Procedure Laterality Date  . Cholecystectomy    . Appendectomy    . Pacemaker insertion      [employer]  . Vena cava filter placement  07/19/2012    Procedure: INSERTION VENA-CAVA FILTER;  Surgeon: Larina Earthly, MD;  Location: [facility] OR;  Service: Vascular;  Laterality: [name];  . Insert / replace / remove pacemaker  2014    ICD. [employer] Jude; inserted 2008 [facility]); gen change 2014 SK  . Abdominal hysterectomy    . Implantable cardioverter defibrillator generator change [name] 07/03/2013   Procedure: IMPLANTABLE CARDIOVERTER DEFIBRILLATOR GENERATOR CHANGE;  Surgeon: Duke Salvia, MD;  Location: [facility] CATH LAB;  Service: Cardiovascular;  Laterality: [name];   HPI:  Elizabeth Love is a 59 y.o. 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 fib on Xarelto, noncompliance with medical treatment, hypothyroidism, cardiomyopathy. The patient is presenting with complaints of abdominal pain as well as headache. She has previously been evaluated for swallowing with a mild dysphagia noted in previous notes. Dys 3 diet and thin liquids as well as regular diet and thin liquids have been recommended in the past.   Assessment / Plan / Recommendation Clinical Impression  Pt was observed with minimal PO trials due to limited participation, however no overt signs of aspiration were observed. She did have oral residue and right-sided buccal pocketing without awareness, requiring Mod cues from SLP to clear with lingual sweep and liquid wash. Recommend Dys 3 diet and thin liquids. Pt appears to be at her baseline level of swallow function per previous SLP evaluations. No further s/u needed at this time.    Aspiration Risk  Mild    Diet Recommendation Dysphagia 3 (Mechanical Soft);Thin liquid   Liquid  Administration via: Cup;Straw Medication Administration: Whole meds with puree Supervision: Patient able to self feed;Full supervision/cueing for compensatory strategies Compensations: Slow rate;Small sips/bites;Check for pocketing Postural Changes and/or Swallow Maneuvers: Seated upright 90 degrees    Other  Recommendations Oral Care Recommendations: Oral care BID   Follow Up Recommendations  None    Pertinent Vitals/Pain n/a    SLP Swallow Goals     Swallow Study Prior Functional Status       General HPI: Elizabeth Love is a 59 y.o. female with Past medical history of  seizures, hypertension, CVA with residual right-sided weakness and expressive aphasia, diabetes mellitus, chronic systolic CHF, A. fib on Xarelto, noncompliance with medical treatment, hypothyroidism, cardiomyopathy. The patient is presenting with complaints of abdominal pain as well as headache. She has previously been evaluated for swallowing with a mild dysphagia noted in previous notes. Dys 3 diet and thin liquids as well as regular diet and thin liquids have been recommended in the past. Type of Study: Bedside swallow evaluation Previous Swallow Assessment: see HPI Diet Prior to this Study: NPO Temperature Spikes Noted: No Respiratory Status: Room air History of Recent Intubation: No Behavior/Cognition: Alert;Uncooperative;Other (comment) (aphasia) Oral Cavity - Dentition: Adequate natural dentition Self-Feeding Abilities: Able to feed self Patient Positioning: Upright in bed Baseline Vocal Quality: Clear    Oral/Motor/Sensory Function     Ice Chips Ice chips: Not tested   Thin Liquid Thin Liquid: Within functional limits Presentation: Straw    Nectar Thick Nectar Thick Liquid: Not tested   Honey Thick Honey Thick Liquid: Not tested   Puree Puree: Not tested   Solid   GO    Solid: Impaired Presentation: Self Fed Oral Phase Impairments: Poor awareness of bolus;Reduced lingual movement/coordination Oral Phase Functional Implications: Oral residue      Maxcine Ham, M.A. CCC-SLP [phone]  Maxcine Ham 10/30/2014,12:03 PM

## 2014-10-30 NOTE — ED Provider Notes (Signed)
CSN: [id number]     Arrival date & time 10/29/14  2127 History   First MD Initiated Contact with Patient 10/29/14 2138     Chief Complaint  Patient presents with  . Chest Pain  . Headache     (Consider location/radiation/quality/duration/timing/severity/associated sxs/prior Treatment) Patient is a 59 y.o. female presenting with chest pain and headaches. The history is provided by the patient and a relative. The history is limited by the condition of the patient.  Chest Pain Pain location:  Unable to specify Associated symptoms: headache   Associated symptoms: no fever, no shortness of breath and not vomiting   Headache Associated symptoms: no fever and no vomiting     Past Medical History  Diagnosis Date  . Seizures   . Hypertension   . Stroke     2007 - R sided weakness and expressive aphasia with h/o behavioral problems related to this  . Asthma   . Diabetes mellitus   . Chronic systolic CHF (congestive heart failure)   . Atrial fibrillation     a. On Xarelto after having DVT (prev felt to be poor anticoag cand 2/2 noncompliance).  . History of DVT (deep vein thrombosis)     a. 06/2012: RLE DVT, + protein C deficiency, placed on Xarelto. s/p IVC filter  . Noncompliance     Due to mental status, family has had to coax her to take medicines  . Hypothyroidism   . VT (ventricular tachycardia)     a. h/o ICD ~2007-2008. b. Adm 08/2012 with UTI, had VT/ICD shock x 5 (hypokalemic);  c. 06/2013 s/p ICD Gen change: SJM DC ICD ser #: [id number]  . Protein C deficiency   . NICM (nonischemic cardiomyopathy)     a. s/p AICD ~2008 (St. Jude) - previous care [place]. No known hx CAD. b. EF 10% by echo 04/2012;  c. 06/2013 s/p ICD Gen change: SJM DC ICD ser #: [id number]  . 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 Panic attacks   . Anxiety    Past Surgical History  Procedure Laterality Date  . Cholecystectomy    . Appendectomy    . Pacemaker insertion      [employer]  . Vena cava  filter placement  07/19/2012    Procedure: INSERTION VENA-CAVA FILTER;  Surgeon: Larina Earthly, MD;  Location: [facility] OR;  Service: Vascular;  Laterality: [name];  . Insert / replace / remove pacemaker  2014    ICD. St. Jude; inserted 2008 [facility]); gen change 2014 SK  . Abdominal hysterectomy    . Implantable cardioverter defibrillator generator change [name] 07/03/2013    Procedure: IMPLANTABLE CARDIOVERTER DEFIBRILLATOR GENERATOR CHANGE;  Surgeon: Duke Salvia, MD;  Location: [facility] CATH LAB;  Service: Cardiovascular;  Laterality: [name];   Family History  Problem Relation Age of Onset  . Hypertension Mother   . Coronary artery disease Neg Hx    History  Substance Use Topics  . Smoking status: Former Games developer  . Smokeless tobacco: Never Used  . Alcohol Use: No   OB History    No data available     Review of Systems  Constitutional: Negative for fever.  Respiratory: Negative for shortness of breath.   Cardiovascular: Positive for chest pain. Negative for leg swelling.  Gastrointestinal: Negative for vomiting.  Skin: Negative for rash and wound.  Neurological: Positive for headaches.  All other systems reviewed and are negative.     Allergies  Penicillins and  Sulfa antibiotics  Home Medications   Prior to Admission medications   Medication Sig Start Date End Date Taking? Authorizing Provider  acetaminophen (TYLENOL) 325 MG tablet Take 650 mg by mouth every 6 (six) hours as needed for pain.    Historical Provider, MD  albuterol (PROVENTIL) (2.5 MG/3ML) 0.083% nebulizer solution Take 2.5 mg by nebulization every 6 (six) hours as needed. For cough or wheeze    Historical Provider, MD  ALPRAZolam Prudy Feeler) 1 MG tablet Take 1 tablet (1 mg total) by mouth at bedtime as needed for anxiety. 03/24/14   Ripudeep Jenna Luo, MD  amiodarone (PACERONE) 200 MG tablet Take 200 mg by mouth See admin instructions. Takes 200mg  on M,T,W,Th,F only Skips doses on Sat and Sun    Historical Provider, MD  furosemide  (LASIX) 40 MG tablet Take 1 tablet (40 mg total) by mouth 2 (two) times daily. 03/24/14   Ripudeep Jenna Luo, MD  insulin lispro (HUMALOG) 100 UNIT/ML injection Inject into the skin 3 (three) times daily before meals. On sliding scale    Historical Provider, MD  iron polysaccharides (NIFEREX) 150 MG capsule Take 1 capsule (150 mg total) by mouth daily. 03/24/14   Ripudeep Jenna Luo, MD  levETIRAcetam (KEPPRA) 1000 MG tablet Take 2,000 mg by mouth 2 (two) times daily.    Historical Provider, MD  levothyroxine (SYNTHROID, LEVOTHROID) 50 MCG tablet Take 50 mcg by mouth daily before breakfast.    Historical Provider, MD  Linaclotide (LINZESS) 145 MCG CAPS capsule Take 145 mcg by mouth daily.    Historical Provider, MD  lisinopril (PRINIVIL,ZESTRIL) 2.5 MG tablet Take 1 tablet (2.5 mg total) by mouth daily. 03/24/14   Ripudeep Jenna Luo, MD  metolazone (ZAROXOLYN) 2.5 MG tablet Take 1 tablet (2.5 mg total) by mouth daily. 04/17/14   Jeralyn Bennett, MD  oxyCODONE-acetaminophen (PERCOCET) 10-325 MG per tablet Take 1 tablet by mouth every 6 (six) hours as needed for pain. 04/17/14   Jeralyn Bennett, MD  pantoprazole (PROTONIX) 40 MG tablet Take 1 tablet (40 mg total) by mouth daily. 03/24/14   Ripudeep Jenna Luo, MD  polyethylene glycol (MIRALAX / GLYCOLAX) packet Take 17 g by mouth daily as needed (constipation).     Historical Provider, MD  QUEtiapine Fumarate (SEROQUEL XR) 150 MG 24 hr tablet Take 1 tablet (150 mg total) by mouth at bedtime. 09/01/13   Marinda Elk, MD  Rivaroxaban (XARELTO) 20 MG TABS tablet Take 20 mg by mouth daily.    Historical Provider, MD  spironolactone (ALDACTONE) 25 MG tablet Take 25 mg by mouth daily.    Historical Provider, MD   BP 106/82 mmHg  Pulse 54  Temp(Src) 98.7 F (37.1 C) (Rectal)  Resp 24  SpO2 100% Physical Exam  Constitutional: She appears well-developed and well-nourished.  HENT:  Mouth/Throat: Oropharynx is clear and moist.  Eyes: Pupils are equal, round, and reactive to  light.  Neck: Normal range of motion.  Cardiovascular: Normal rate and regular rhythm.   Pulmonary/Chest: Effort normal and breath sounds normal. No respiratory distress. She has no wheezes. She exhibits no tenderness.  Abdominal: Soft. She exhibits no distension. There is no tenderness.  Musculoskeletal: She exhibits edema and tenderness.  Non ambulatory  Neurological: She is alert.  aphasic  Skin: Skin is warm.  Nursing note and vitals reviewed.   ED Course  Procedures (including critical care time) Labs Review Labs Reviewed  CBC WITH DIFFERENTIAL/PLATELET - Abnormal; Notable for the following:    RBC 3.78 (*)  Hemoglobin 8.6 (*)    HCT 29.1 (*)    MCV 77.0 (*)    MCH 22.8 (*)    MCHC 29.6 (*)    RDW 20.5 (*)    Platelets 113 (*)    All other components within normal limits  BASIC METABOLIC PANEL - Abnormal; Notable for the following:    Glucose, Bld 101 (*)    Creatinine, Ser 1.72 (*)    GFR calc non Af Amer 32 (*)    GFR calc Af Amer 37 (*)    All other components within normal limits  CBG MONITORING, ED - Abnormal; Notable for the following:    Glucose-Capillary 117 (*)    All other components within normal limits  I-STAT VENOUS BLOOD GAS, ED - Abnormal; Notable for the following:    pH, Ven 7.374 (*)    pCO2, Ven 39.8 (*)    pO2, Ven 27.0 (*)    All other components within normal limits  BLOOD GAS, VENOUS  TSH  BRAIN NATRIURETIC PEPTIDE  HEPATIC FUNCTION PANEL  HEMOGLOBIN A1C  PROTIME-INR  APTT  URINALYSIS W MICROSCOPIC  CBG MONITORING, ED    Imaging Review Ct Head Wo Contrast  10/29/2014   CLINICAL DATA:  Previous stroke. Right-sided deficits and a aphasia. Now complains of headache and leg swelling.  EXAM: CT HEAD WITHOUT CONTRAST  TECHNIQUE: Contiguous axial images were obtained from the base of the skull through the vertex without intravenous contrast.  COMPARISON:  04/15/2014  FINDINGS: Large area of encephalomalacia in the distribution of the left  middle cerebral artery consistent with old infarct. No change since prior study. Ex vacuo dilatation of the left lateral ventricles. Diffuse cerebral atrophy. No abnormal extra-axial fluid collections. Basal cisterns are not effaced. No mass effect or midline shift no evidence of acute intracranial hemorrhage. Calvarium appears intact. Vascular calcifications. Visualized paranasal sinuses and mastoid air cells are not opacified.  IMPRESSION: Old left MCA distribution infarct. Chronic atrophy. No acute intracranial abnormalities.   Electronically Signed   By: Burman Nieves M.D.   On: 10/29/2014 23:43     EKG Interpretation None      MDM   Final diagnoses:  Headache  Acute renal insufficiency  Peripheral edema         Arman Filter, NP 10/30/14 0031  Ethelda Chick, MD 10/30/14 [phone]

## 2014-10-30 NOTE — Progress Notes (Signed)
Utilization Review Completed.Mysty Kielty T2/08/2015  

## 2014-10-30 NOTE — ED Notes (Signed)
Admitting at bedside 

## 2014-10-30 NOTE — H&P (Signed)
[facility] History and Physical  Patient: Elizabeth Love  MRN: [id number]  DOB: 07/29/1956  DOS: the patient was seen and examined on 10/30/2014 PCP: No primary care provider on file. Dr. Kathrynn Speed at [facility].  Chief Complaint: Generalized pain and headache  HPI: Elizabeth Love is a 59 y.o. 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 fib on Xarelto, noncompliance with medical treatment, hypothyroidism, cardiomyopathy. The patient is presenting with complaints of abdominal pain as well as headache. Patient mentions she has chronic headache as well as chronic abdominal pain. She denies any chest pain denies any focal deficit denies any diarrhea or constipation denies any burning urination denies any cough. As per patient's son she has ran out of her medications specifically pain medications and Lasix since last one month. She has been taking all other medications. She has been on any medication for mood disorder but the son does not remember that and other than that there is no change in her medication. Patient has been taking all her medications regularly as per son. Patient's PCP  The patient is coming from home. And at her baseline independent for most of her ADL.  Review of Systems: as mentioned in the history of present illness.  A Comprehensive review of the other systems is negative.  Past Medical History  Diagnosis Date  . Seizures   . Hypertension   . Stroke     2007 - R sided weakness and expressive aphasia with h/o behavioral problems related to this  . Asthma   . Diabetes mellitus   . Chronic systolic CHF (congestive heart failure)   . Atrial fibrillation     a. On Xarelto after having DVT (prev felt to be poor anticoag cand 2/2 noncompliance).  . History of DVT (deep vein thrombosis)     a. 06/2012: RLE DVT, + protein C deficiency,  placed on Xarelto. s/p IVC filter  . Noncompliance     Due to mental status, family has had to coax her to take medicines  . Hypothyroidism   . VT (ventricular tachycardia)     a. h/o ICD ~2007-2008. b. Adm 08/2012 with UTI, had VT/ICD shock x 5 (hypokalemic);  c. 06/2013 s/p ICD Gen change: SJM DC ICD ser #: [id number]  . Protein C deficiency   . NICM (nonischemic cardiomyopathy)     a. s/p AICD ~2008 (St. Jude) - previous care [facility]. No known hx CAD. b. EF 10% by echo 04/2012;  c. 06/2013 s/p ICD Gen change: SJM DC ICD ser #: [id number]  . 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 Panic attacks   . Anxiety    Past Surgical History  Procedure Laterality Date  . Cholecystectomy    . Appendectomy    . Pacemaker insertion      [employer]  . Vena cava filter placement  07/19/2012    Procedure: INSERTION VENA-CAVA FILTER;  Surgeon: Larina Earthly, MD;  Location: [facility] OR;  Service: Vascular;  Laterality: [name];  . Insert / replace / remove pacemaker  2014    ICD. St. Jude; inserted 2008 [facility]); gen change 2014 SK  . Abdominal hysterectomy    . Implantable cardioverter defibrillator generator change [name] 07/03/2013    Procedure: IMPLANTABLE CARDIOVERTER DEFIBRILLATOR GENERATOR CHANGE;  Surgeon: Duke Salvia, MD;  Location: [facility] CATH LAB;  Service: Cardiovascular;  Laterality: [name];  Social History:  reports that she has quit smoking. She has never used smokeless tobacco. She reports that she does not drink alcohol or use illicit drugs.  Allergies  Allergen Reactions  . Penicillins Rash  . Sulfa Antibiotics Rash    Family History  Problem Relation Age of Onset  . Hypertension Mother   . Coronary artery disease Neg Hx     Prior to Admission medications   Medication Sig Start Date End Date Taking? Authorizing Provider  acetaminophen (TYLENOL) 325 MG tablet Take 650 mg by mouth every 6 (six) hours as needed for pain.    Historical Provider, MD  albuterol (PROVENTIL) (2.5 MG/3ML) 0.083%  nebulizer solution Take 2.5 mg by nebulization every 6 (six) hours as needed. For cough or wheeze    Historical Provider, MD  ALPRAZolam Prudy Feeler) 1 MG tablet Take 1 tablet (1 mg total) by mouth at bedtime as needed for anxiety. 03/24/14   Ripudeep Jenna Luo, MD  amiodarone (PACERONE) 200 MG tablet Take 200 mg by mouth See admin instructions. Takes  on M,T,W,Th,F only Skips doses on Sat and Sun    Historical Provider, MD  furosemide (LASIX) 40 MG tablet Take 1 tablet (40 mg total) by mouth 2 (two) times daily. 03/24/14   Ripudeep Jenna Luo, MD  insulin lispro (HUMALOG) 100 UNIT/ML injection Inject into the skin 3 (three) times daily before meals. On sliding scale    Historical Provider, MD  iron polysaccharides (NIFEREX) 150 MG capsule Take 1 capsule (150 mg total) by mouth daily. 03/24/14   Ripudeep Jenna Luo, MD  levETIRAcetam (KEPPRA) 1000 MG tablet Take 2,000 mg by mouth 2 (two) times daily.    Historical Provider, MD  levothyroxine (SYNTHROID, LEVOTHROID) 50 MCG tablet Take 50 mcg by mouth daily before breakfast.    Historical Provider, MD  Linaclotide (LINZESS) 145 MCG CAPS capsule Take 145 mcg by mouth daily.    Historical Provider, MD  lisinopril (PRINIVIL,ZESTRIL) 2.5 MG tablet Take 1 tablet (2.5 mg total) by mouth daily. 03/24/14   Ripudeep Jenna Luo, MD  metolazone (ZAROXOLYN) 2.5 MG tablet Take 1 tablet (2.5 mg total) by mouth daily. 04/17/14   Jeralyn Bennett, MD  oxyCODONE-acetaminophen (PERCOCET) 10-325 MG per tablet Take 1 tablet by mouth every 6 (six) hours as needed for pain. 04/17/14   Jeralyn Bennett, MD  pantoprazole (PROTONIX) 40 MG tablet Take 1 tablet (40 mg total) by mouth daily. 03/24/14   Ripudeep Jenna Luo, MD  polyethylene glycol (MIRALAX / GLYCOLAX) packet Take 17 g by mouth daily as needed (constipation).     Historical Provider, MD  QUEtiapine Fumarate (SEROQUEL XR) 150 MG 24 hr tablet Take 1 tablet (150 mg total) by mouth at bedtime. 09/01/13   Marinda Elk, MD  Rivaroxaban (XARELTO) 20 MG  TABS tablet Take 20 mg by mouth daily.    Historical Provider, MD  spironolactone (ALDACTONE) 25 MG tablet Take 25 mg by mouth daily.    Historical Provider, MD    Physical Exam: Filed Vitals:   10/30/14 0015 10/30/14 0045 10/30/14 0220 10/30/14 0348  BP: 106/82 106/76 107/95 104/68  Pulse: 54 88 82 82  Temp:   97.6 F (36.4 C) 98.2 F (36.8 C)  TempSrc:   Oral Oral  Resp: Weight:    56.4 kg (124 lb 5.4 oz)  SpO2: 100% 100% 100% 98%    General: Alert, Awake and Oriented to Place and Person. Appear in mild distress Expressive aphasia present Eyes:  PERRL ENT: Oral Mucosa clear moist. Neck: no JVD Cardiovascular: S1 and S2 Present, no Murmur, Peripheral Pulses Present Respiratory: Bilateral Air entry equal and Decreased, faint basal Crackles, no wheezes Abdomen: Bowel Sound present sluggish Soft adiffuseender Skin: no Rash ExtremBilateralPedal edema, no calf tenderness Neurologic: Grossly no focal neuro deficit other than chronic right-sided weakness and expressive aphasia.   Labs on Admission:  CBC:  Recent Labs Lab 10/29/14 2250  WBC 5.8  NEUTROABS 3.7  HGB 8.6*  HCT 29.1*  MCV 77.0*  PLT 113*    CMP     Component Value Date/Time   NA 139 10/29/2014 2250   K 3.7 10/29/2014 2250   CL 106 10/29/2014 2250   CO2 21 10/29/2014 2250   GLUCOSE 101* 10/29/2014 2250   BUN 23 10/29/2014 2250   CREATININE 1.72* 10/29/2014 2250   CALCIUM 9.1 10/29/2014 2250   PROT 7.5 10/30/2014 0102   ALBUMIN 2.9* 10/30/2014 0102   AST 20 10/30/2014 0102   ALT 7 10/30/2014 0102   ALKPHOS 54 10/30/2014 0102   BILITOT 2.2* 10/30/2014 0102   GFRNONAA 32* 10/29/2014 2250   GFRAA 37* 10/29/2014 2250    No results for input(s): LIPASE, AMYLASE in the last 168 hours.  No results for input(s): CKTOTAL, CKMB, CKMBINDEX, TROPONINI in the last 168 hours. BNP (last 3 results)  Recent Labs  10/30/14 0102  BNP 2965.9*    ProBNP (last 3 results)  Recent Labs   01/10/14 0912 03/21/14 0633 04/15/14 0135  PROBNP 7500.0* 11633.0* 10371.0*     Radiological Exams on Admission: Ct Head Wo Contrast  10/29/2014   CLINICAL DATA:  Previous stroke. Right-sided deficits and a aphasia. Now complains of headache and leg swelling.  EXAM: CT HEAD WITHOUT CONTRAST  TECHNIQUE: Contiguous axial images were obtained from the base of the skull through the vertex without intravenous contrast.  COMPARISON:  04/15/2014  FINDINGS: Large area of encephalomalacia in the distribution of the left middle cerebral artery consistent with old infarct. No change since prior study. Ex vacuo dilatation of the left lateral ventricles. Diffuse cerebral atrophy. No abnormal extra-axial fluid collections. Basal cisterns are not effaced. No mass effect or midline shift no evidence of acute intracranial hemorrhage. Calvarium appears intact. Vascular calcifications. Visualized paranasal sinuses and mastoid air cells are not opacified.  IMPRESSION: Old left MCA distribution infarct. Chronic atrophy. No acute intracranial abnormalities.   Electronically Signed   By: Burman Nieves M.D.   On: 10/29/2014 23:43   Dg Chest Port 1 View  10/30/2014   CLINICAL DATA:  Shortness of breath. History of stroke, CHF, asthma, diabetes.  EXAM: PORTABLE CHEST - 1 VIEW  COMPARISON:  04/15/2014  FINDINGS: Cardiac pacemaker. Diffuse cardiac enlargement with mild pulmonary vascular congestion. Vascular congestion is improved since prior study. No definite infiltration or consolidation. Small right pleural effusion with fluid in the right minor fissure. No pneumothorax.  IMPRESSION: Diffuse cardiac enlargement. Mild pulmonary vascular congestion. Right pleural effusion with fluid in the fissure.   Electronically Signed   By: Burman Nieves M.D.   On: 10/30/2014 01:15   Dg Abd 2 Views  10/30/2014   CLINICAL DATA:  Abdominal pain and constipation.  Nausea.  EXAM: ABDOMEN - 2 VIEW  COMPARISON:  03/21/2014  FINDINGS:  Scattered gas and stool in the colon. No small or large bowel distention. No free intra-abdominal air. No abnormal air-fluid levels. Surgical clips in the right upper quadrant. Inferior vena caval filter present. Visualized lower chest demonstrates cardiac pacemaker.  Diffuse cardiac enlargement. Probable small bilateral pleural effusions. Visualized bones appear intact. No radiopaque stones.  IMPRESSION: Normal bowel gas pattern. No evidence of bowel obstruction. Incidental note of cardiac enlargement.   Electronically Signed   By: Burman Nieves M.D.   On: 10/30/2014 01:58    EKG: Independently reviewed. normal sinus rhythm, nonspecific ST and T waves changes.  Assessment/Plan Principal Problem:   Acute encephalopathy Active Problems:   Aphasia as late effect of cerebrovascular accident   Protein C deficiency   Noncompliance   DM type 2 (diabetes mellitus, type 2)   Automatic implantable cardioverter-defibrillator in situ   Chronic systolic CHF (congestive heart failure)   Atrial fibrillation   Seizures   1. Acute encephalopathy The patient is presenting with complaints of headache. She also has complains of abdominal pain. She has not been compliant with any of her medications. As per patient's son and she has not been acting herself and has not been able to sleep for last 3 nights and continuously crying. Most likely had confusion secondary to chronic pain, which the patient is unable to communicate due to her aphasia. At present I will resume her pain medication. UA is still pending. Checking TSH and hepatic function panel. A suspect noncompliance with Synthroid.  2.Chronic systolic heart failure. Mild volume overload. Chest x-ray shows congestion patient has lower extremity swelling. Patient does not appear hypoxic but has basal crackles. With this I would give her 20 of IV Lasix. Monitor BMP and monitor ins and outs. Monitor daily weight. Resume medication.  3.History of  seizure disorder. Continue with the Keppra and Lamictal.  4. A. fib, history of CVAs, documentation of protein C deficiency and a DVT. Patient's INR is significantly elevated at present. We will consult with pharmacy for continuation of Xarelto. Continue amiodarone.  5. Chronic abdominal pain. Next of the abdomen does not show any evidence of bowel obstruction. Closely monitor and patient will remain nothing by mouth until bowels are moving.  Advance goals of care discussion: Full code presumed  DVT Prophylaxis: on chronic anticoagulation Nutrition: Nothing by mouth   Family Communication: Discussed with son on the phone  opportunity was given to ask question and all questions were answered satisfactorily at the time of interview. Disposition: Admitted to inpatient in telemetry unit.  Author: Lynden Oxford, MD [facility] Pager: [phone] 10/30/2014, 6:09 AM    If 7PM-7AM, please contact night-coverage [email or web address] Password [email or web address]

## 2014-10-31 ENCOUNTER — Inpatient Hospital Stay (HOSPITAL_COMMUNITY): Payer: Medicare Other

## 2014-10-31 DIAGNOSIS — R748 Abnormal levels of other serum enzymes: Secondary | ICD-10-CM

## 2014-10-31 DIAGNOSIS — R7989 Other specified abnormal findings of blood chemistry: Secondary | ICD-10-CM | POA: Insufficient documentation

## 2014-10-31 DIAGNOSIS — N289 Disorder of kidney and ureter, unspecified: Secondary | ICD-10-CM

## 2014-10-31 LAB — BASIC METABOLIC PANEL
Anion gap: 7 (ref 5–15)
BUN: 26 mg/dL — ABNORMAL HIGH (ref 6–23)
CO2: 24 mmol/L (ref 19–32)
CREATININE: 1.73 mg/dL — AB (ref 0.50–1.10)
Calcium: 9 mg/dL (ref 8.4–10.5)
Chloride: 107 mmol/L (ref 96–112)
GFR calc Af Amer: 37 mL/min — ABNORMAL LOW (ref >90)
GFR calc non Af Amer: 32 mL/min — ABNORMAL LOW (ref >90)
GLUCOSE: 146 mg/dL — AB (ref 70–99)
POTASSIUM: 3.6 mmol/L (ref 3.5–5.1)
Sodium: 138 mmol/L (ref 135–145)

## 2014-10-31 LAB — AMMONIA: AMMONIA: 33 umol/L — AB (ref 11–32)

## 2014-10-31 LAB — HEMOGLOBIN A1C
Hgb A1c MFr Bld: 7 % — ABNORMAL HIGH (ref 4.8–5.6)
Mean Plasma Glucose: 154 mg/dL

## 2014-10-31 MED ORDER — RIVAROXABAN 15 MG PO TABS: 15.0000 mg | ORAL_TABLET | Freq: Every day | ORAL | Status: DC

## 2014-10-31 MED ORDER — AMIODARONE HCL 200 MG PO TABS: 200.0000 mg | ORAL_TABLET | ORAL | Status: AC

## 2014-10-31 MED ORDER — LINACLOTIDE 145 MCG PO CAPS: 145.0000 ug | ORAL_CAPSULE | Freq: Every day | ORAL | Status: DC

## 2014-10-31 MED ORDER — PANTOPRAZOLE SODIUM 40 MG PO TBEC: 40.0000 mg | DELAYED_RELEASE_TABLET | Freq: Every day | ORAL | Status: AC

## 2014-10-31 MED ORDER — LEVOTHYROXINE SODIUM 50 MCG PO TABS: 50.0000 ug | ORAL_TABLET | Freq: Every day | ORAL | Status: AC

## 2014-10-31 MED ORDER — LEVETIRACETAM 1000 MG PO TABS: 2000.0000 mg | ORAL_TABLET | Freq: Two times a day (BID) | ORAL | Status: AC

## 2014-10-31 MED ORDER — QUETIAPINE FUMARATE ER 150 MG PO TB24: 150.0000 mg | ORAL_TABLET | Freq: Every day | ORAL | Status: AC

## 2014-10-31 MED ORDER — POLYSACCHARIDE IRON COMPLEX 150 MG PO CAPS: 150.0000 mg | ORAL_CAPSULE | Freq: Every day | ORAL | Status: DC

## 2014-10-31 NOTE — Progress Notes (Signed)
Discharge instructions reviewed with pt's brother, Dorinda Hill, over the phone due to him not being able to be present at discharge. All questions answered. Pt's discharge packet and rx'es will be sent with EMS. Pt to be transported home via EMS, Social work already notified for transport set up.

## 2014-10-31 NOTE — Clinical Social Work Note (Signed)
CSW arranged ambulance transport via [facility] to patient's home address.   Clinical Social Worker will sign off for now as social work intervention is no longer needed. Please consult [place] again if new need arises.  Derenda Fennel, MSW, LCSWA [phone] 10/31/2014 3:28 PM

## 2014-10-31 NOTE — Discharge Summary (Signed)
Physician Discharge Summary  TZIPORAH KNOKE ZOX:[id number] DOB: 1955-12-31 DOA: 10/29/2014  PCP: No primary care provider on file.  Admit date: 10/29/2014 Discharge date: 10/31/2014  Time spent: 60 minutes  Recommendations for Outpatient Follow-up:  Patient will be discharged home. She will need to follow-up with her primary care physician within one week of discharge. Patient should continue her medications as prescribed. Patient should follow a heart healthy/carb modified diet. Patient should have a repeat CBC and BMP within 1 week of discharge.  Discharge Diagnoses:  Acute encephalopathy Chronic systolic heart failure Acute kidney injury History of seizure disorder Atrial fibrillation, history of DVT,  protein C deficiency, history of CVA Chronic pain Hypothyroidism  Discharge Condition: Stable  Diet recommendation: heart healthy/carb modified  Filed Weights   10/30/14 0348  Weight: 56.4 kg (124 lb 5.4 oz)    History of present illness:  on 10/30/2014 by Dr. Lynden Oxford ANURADHA CHABOT is a 59 y.o. 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 fib on Xarelto, noncompliance with medical treatment, hypothyroidism, cardiomyopathy. The patient is presenting with complaints of abdominal pain as well as headache. Patient mentions she has chronic headache as well as chronic abdominal pain. She denies any chest pain denies any focal deficit denies any diarrhea or constipation denies any burning urination denies any cough. As per patient's son she has ran out of her medications specifically pain medications and Lasix since last one month. She has been taking all other medications. She has been on any medication for mood disorder but the son does not remember that and other than that there is no change in her medication. Patient has been taking all her medications regularly  as per son.  Hospital Course:  Acute Encephalopathy -Unknown etiology, however, patient is at her baseline, according to her brother -It seems that according to the prior, patient becomes agitated when she does not get her way. She was brought to the hospital for questionable neck swelling and ankle pain that she supposedly broke 6 months ago. -CT of the head was negative, chest x-ray and UA show now infection -Patient is afebrile with no leukocytosis -Patient complains of generalized pain however seems to have a history of chronic pain -Ammonia level 33 **Long conversation with the brother, patient obtains some medications from [facility] and son from another mail order pharmacy. Patient's brother does not know which medication she is supposed to be on. I called [facility], several other medications have not been filled since July 2014, however patient does get her diuretics, anti-anxiety, pain medications from [facility] Will refill all of patient's medications. Spoke with brother, he will pick them up from [place]. Patient does have an upcoming appointment with her primary care physician this week.  Attempted to call primary care physician listed in Epic however this is not her physician.  Patient's brother does not know the name of her physician. Patient's son does not know any information either.  Chronic systolic heart failure -BNP appears to be at baseline, chest x-ray showed some mild congestion -Patient has no crackles on exam -She was given 1 dose of IV Lasix at admission -Continue to monitor daily weights, intake and output -Continue home medications  Acute kidney injury/Possible CKD Stage 3 -GFR appears to be within stage 3  -possibly secondary to meds vs poor oral intake -Patient will need outpatient followup with her primary care physician within one week and repeat labs -  Renal US showed no hydronephrosis  History of seizure disorder -Continue Keppra and Lamictal  Atrial  fibrillation/history of DVT/protein C deficiency/history of CVA -Continue amiodarone, Xarelto  Chronic pain -Patient is headache as well as abdominal pain -X-ray of the abdomen does not show any evidence of bowel obstruction, patient wants to eat. -Speech consulted and recommended dysphagia 3 diet -Patient refusing her by mouth medications and request fentanyl   Hypothyroidism -TSH 7.371, will continue Synthroid -Patient will need to follow-up with her primary care physician -Some question of patient not being compliant with medications  Microcytic Anemia -Hb 8.0, baseline hemoglobin appears to be between 8 and 9 -she is supposed be taking iron supplementation however does not seem to be taking that, will refill her medication  Procedures: Renal US  Consultations: None  Discharge Exam: Filed Vitals:   10/31/14 1357  BP: 102/72  Pulse: 94  Temp: 97.3 F (36.3 C)  Resp: 16  Exam  General: Well developed, NAD, appears stated age  HEENT: NCAT, mucous membranes moist.   Cardiovascular: S1 S2 auscultated, RRR  Respiratory: Clear to auscultation bilaterally with equal chest rise  Abdomen: Soft, nontender, nondistended, + bowel sounds  Extremities: warm dry without cyanosis clubbing.   Discharge Instructions      Discharge Instructions    Discharge instructions    Complete by:  As directed   Patient will be discharged home. She will need to follow-up with her primary care physician within one week of discharge. Patient should continue her medications as prescribed. Patient should follow a heart healthy/carb modified diet. Patient should have a repeat CBC and BMP within 1 week of discharge.            Medication List    STOP taking these medications        furosemide 40 MG tablet  Commonly known as:  LASIX     lisinopril 2.5 MG tablet  Commonly known as:  PRINIVIL,ZESTRIL     metolazone 2.5 MG tablet  Commonly known as:  ZAROXOLYN     spironolactone 25  MG tablet  Commonly known as:  ALDACTONE      TAKE these medications        acetaminophen 325 MG tablet  Commonly known as:  TYLENOL  Take 650 mg by mouth every 6 (six) hours as needed for pain.     albuterol (2.5 MG/3ML) 0.083% nebulizer solution  Commonly known as:  PROVENTIL  Take 2.5 mg by nebulization every 6 (six) hours as needed. For cough or wheeze     ALPRAZolam 1 MG tablet  Commonly known as:  XANAX  Take 1 tablet (1 mg total) by mouth at bedtime as needed for anxiety.     amiodarone 200 MG tablet  Commonly known as:  PACERONE  - Take 1 tablet (200 mg total) by mouth See admin instructions. Takes  on M,T,W,Th,F only  - Skips doses on Sat and Sun     insulin lispro 100 UNIT/ML injection  Commonly known as:  HUMALOG  Inject into the skin 3 (three) times daily before meals. On sliding scale     iron polysaccharides 150 MG capsule  Commonly known as:  NIFEREX  Take 1 capsule (150 mg total) by mouth daily.     levETIRAcetam 1000 MG tablet  Commonly known as:  KEPPRA  Take 2 tablets (2,000 mg total) by mouth 2 (two) times daily.     levothyroxine 50 MCG tablet  Commonly known as:  SYNTHROID, LEVOTHROID  Take 1 tablet (50 mcg total) by mouth daily before breakfast.     Linaclotide 145 MCG Caps capsule  Commonly known as:  LINZESS  Take 1 capsule (145 mcg total) by mouth daily.     oxyCODONE-acetaminophen 10-325 MG per tablet  Commonly known as:  PERCOCET  Take 1 tablet by mouth every 6 (six) hours as needed for pain.     pantoprazole 40 MG tablet  Commonly known as:  PROTONIX  Take 1 tablet (40 mg total) by mouth daily.     polyethylene glycol packet  Commonly known as:  MIRALAX / GLYCOLAX  Take 17 g by mouth daily as needed (constipation).     QUEtiapine Fumarate 150 MG 24 hr tablet  Commonly known as:  SEROQUEL XR  Take 1 tablet (150 mg total) by mouth at bedtime.     Rivaroxaban 15 MG Tabs tablet  Commonly known as:  XARELTO  Take 1 tablet (15  mg total) by mouth daily with supper.       Allergies  Allergen Reactions  . Penicillins Rash  . Sulfa Antibiotics Rash      The results of significant diagnostics from this hospitalization (including imaging, microbiology, ancillary and laboratory) are listed below for reference.    Significant Diagnostic Studies: Ct Head Wo Contrast  10/29/2014   CLINICAL DATA:  Previous stroke. Right-sided deficits and a aphasia. Now complains of headache and leg swelling.  EXAM: CT HEAD WITHOUT CONTRAST  TECHNIQUE: Contiguous axial images were obtained from the base of the skull through the vertex without intravenous contrast.  COMPARISON:  04/15/2014  FINDINGS: Large area of encephalomalacia in the distribution of the left middle cerebral artery consistent with old infarct. No change since prior study. Ex vacuo dilatation of the left lateral ventricles. Diffuse cerebral atrophy. No abnormal extra-axial fluid collections. Basal cisterns are not effaced. No mass effect or midline shift no evidence of acute intracranial hemorrhage. Calvarium appears intact. Vascular calcifications. Visualized paranasal sinuses and mastoid air cells are not opacified.  IMPRESSION: Old left MCA distribution infarct. Chronic atrophy. No acute intracranial abnormalities.   Electronically Signed   By: Burman Nieves M.D.   On: 10/29/2014 23:43   US Renal  10/31/2014   CLINICAL DATA:  Elevated creatinine.  EXAM: RENAL/URINARY TRACT ULTRASOUND COMPLETE  COMPARISON:  CT 03/21/2014  FINDINGS: Right Kidney:  Length: 10 cm. Normal renal cortical thickness and echogenicity. No hydronephrosis. Within the inferior pole of the right kidney there is a 1.5 cm cyst.  Left Kidney:  Length: 9.5 cm. Echogenicity within normal limits. No mass or hydronephrosis visualized.  Bladder:  Appears normal for degree of bladder distention.  Small amount of ascites. Right pleural effusion. Trace perinephric fluid bilaterally.  IMPRESSION: No hydronephrosis.    Electronically Signed   By: Annia Belt M.D.   On: 10/31/2014 14:18   Dg Chest Port 1 View  10/30/2014   CLINICAL DATA:  Shortness of breath. History of stroke, CHF, asthma, diabetes.  EXAM: PORTABLE CHEST - 1 VIEW  COMPARISON:  04/15/2014  FINDINGS: Cardiac pacemaker. Diffuse cardiac enlargement with mild pulmonary vascular congestion. Vascular congestion is improved since prior study. No definite infiltration or consolidation. Small right pleural effusion with fluid in the right minor fissure. No pneumothorax.  IMPRESSION: Diffuse cardiac enlargement. Mild pulmonary vascular congestion. Right pleural effusion with fluid in the fissure.   Electronically Signed   By: Burman Nieves M.D.   On: 10/30/2014 01:15   Dg Abd 2 Views  10/30/2014  CLINICAL DATA:  Abdominal pain and constipation.  Nausea.  EXAM: ABDOMEN - 2 VIEW  COMPARISON:  03/21/2014  FINDINGS: Scattered gas and stool in the colon. No small or large bowel distention. No free intra-abdominal air. No abnormal air-fluid levels. Surgical clips in the right upper quadrant. Inferior vena caval filter present. Visualized lower chest demonstrates cardiac pacemaker. Diffuse cardiac enlargement. Probable small bilateral pleural effusions. Visualized bones appear intact. No radiopaque stones.  IMPRESSION: Normal bowel gas pattern. No evidence of bowel obstruction. Incidental note of cardiac enlargement.   Electronically Signed   By: Burman Nieves M.D.   On: 10/30/2014 01:58    Microbiology: No results found for this or any previous visit (from the past 240 hour(s)).   Labs: Basic Metabolic Panel:  Recent Labs Lab 10/29/14 2250 10/30/14 0547 10/31/14 0445  NA 139 137 138  K 3.7 3.8 3.6  CL 106 107 107  CO2 21 22 24   GLUCOSE 101* 91 146*  BUN 23 25* 26*  CREATININE 1.72* 1.75* 1.73*  CALCIUM 9.1 8.8 9.0   Liver Function Tests:  Recent Labs Lab 10/30/14 0102 10/30/14 0547  AST 20 19  ALT 7 8  ALKPHOS 54 52  BILITOT 2.2* 1.8*    PROT 7.5 7.3  ALBUMIN 2.9* 2.8*   No results for input(s): LIPASE, AMYLASE in the last 168 hours.  Recent Labs Lab 10/31/14 0445  AMMONIA 33*   CBC:  Recent Labs Lab 10/29/14 2250 10/30/14 0547  WBC 5.8 4.9  NEUTROABS 3.7 3.1  HGB 8.6* 8.0*  HCT 29.1* 26.9*  MCV 77.0* 76.2*  PLT 113* 99*   Cardiac Enzymes: No results for input(s): CKTOTAL, CKMB, CKMBINDEX, TROPONINI in the last 168 hours. BNP: BNP (last 3 results)  Recent Labs  10/30/14 0102  BNP 2965.9*    ProBNP (last 3 results)  Recent Labs  01/10/14 0912 03/21/14 0633 04/15/14 0135  PROBNP 7500.0* 11633.0* 10371.0*    CBG:  Recent Labs Lab 10/29/14 2147  GLUCAP 117*       Signed:  Edsel Petrin  [facility] 10/31/2014, 2:39 PM

## 2014-10-31 NOTE — Progress Notes (Signed)
[place] contacted concerning ordered [place]. Pt to be discharged after [place] completed per Dr.Mikhail if [place] negative. Will continue to monitor.

## 2014-11-18 ENCOUNTER — Encounter: Payer: Self-pay | Admitting: *Deleted

## 2014-12-28 ENCOUNTER — Other Ambulatory Visit (HOSPITAL_COMMUNITY): Payer: Self-pay | Admitting: Internal Medicine

## 2015-01-05 ENCOUNTER — Emergency Department (HOSPITAL_COMMUNITY): Payer: Medicare Other

## 2015-01-05 ENCOUNTER — Other Ambulatory Visit (HOSPITAL_COMMUNITY): Payer: Self-pay

## 2015-01-05 ENCOUNTER — Encounter (HOSPITAL_COMMUNITY): Payer: Self-pay | Admitting: *Deleted

## 2015-01-05 ENCOUNTER — Inpatient Hospital Stay (HOSPITAL_COMMUNITY)
Admission: EM | Admit: 2015-01-05 | Discharge: 2015-01-08 | DRG: 100 | Disposition: A | Discharge status: Home / Self Care | Payer: Medicare Other | Attending: Internal Medicine | Admitting: Internal Medicine

## 2015-01-05 DIAGNOSIS — I5023 Acute on chronic systolic (congestive) heart failure: Secondary | ICD-10-CM | POA: Diagnosis present

## 2015-01-05 DIAGNOSIS — E876 Hypokalemia: Secondary | ICD-10-CM | POA: Diagnosis present

## 2015-01-05 DIAGNOSIS — I130 Hypertensive heart and chronic kidney disease with heart failure and stage 1 through stage 4 chronic kidney disease, or unspecified chronic kidney disease: Secondary | ICD-10-CM | POA: Diagnosis present

## 2015-01-05 DIAGNOSIS — J45909 Unspecified asthma, uncomplicated: Secondary | ICD-10-CM | POA: Diagnosis present

## 2015-01-05 DIAGNOSIS — R4182 Altered mental status, unspecified: Secondary | ICD-10-CM

## 2015-01-05 DIAGNOSIS — G894 Chronic pain syndrome: Secondary | ICD-10-CM | POA: Diagnosis present

## 2015-01-05 DIAGNOSIS — Z9119 Patient's noncompliance with other medical treatment and regimen: Secondary | ICD-10-CM

## 2015-01-05 DIAGNOSIS — I69320 Aphasia following cerebral infarction: Secondary | ICD-10-CM

## 2015-01-05 DIAGNOSIS — T426X6A Underdosing of other antiepileptic and sedative-hypnotic drugs, initial encounter: Secondary | ICD-10-CM | POA: Diagnosis present

## 2015-01-05 DIAGNOSIS — R1084 Generalized abdominal pain: Secondary | ICD-10-CM

## 2015-01-05 DIAGNOSIS — N183 Chronic kidney disease, stage 3 unspecified: Secondary | ICD-10-CM | POA: Diagnosis present

## 2015-01-05 DIAGNOSIS — N179 Acute kidney failure, unspecified: Secondary | ICD-10-CM | POA: Diagnosis present

## 2015-01-05 DIAGNOSIS — R569 Unspecified convulsions: Secondary | ICD-10-CM | POA: Diagnosis not present

## 2015-01-05 DIAGNOSIS — Z86718 Personal history of other venous thrombosis and embolism: Secondary | ICD-10-CM | POA: Diagnosis not present

## 2015-01-05 DIAGNOSIS — I429 Cardiomyopathy, unspecified: Secondary | ICD-10-CM | POA: Diagnosis not present

## 2015-01-05 DIAGNOSIS — Z882 Allergy status to sulfonamides status: Secondary | ICD-10-CM | POA: Diagnosis not present

## 2015-01-05 DIAGNOSIS — Z9114 Patient's other noncompliance with medication regimen: Secondary | ICD-10-CM | POA: Diagnosis present

## 2015-01-05 DIAGNOSIS — I5022 Chronic systolic (congestive) heart failure: Secondary | ICD-10-CM

## 2015-01-05 DIAGNOSIS — E039 Hypothyroidism, unspecified: Secondary | ICD-10-CM

## 2015-01-05 DIAGNOSIS — Z87891 Personal history of nicotine dependence: Secondary | ICD-10-CM | POA: Diagnosis not present

## 2015-01-05 DIAGNOSIS — I481 Persistent atrial fibrillation: Secondary | ICD-10-CM

## 2015-01-05 DIAGNOSIS — Z880 Allergy status to penicillin: Secondary | ICD-10-CM

## 2015-01-05 DIAGNOSIS — Z9049 Acquired absence of other specified parts of digestive tract: Secondary | ICD-10-CM | POA: Diagnosis present

## 2015-01-05 DIAGNOSIS — I5021 Acute systolic (congestive) heart failure: Secondary | ICD-10-CM | POA: Insufficient documentation

## 2015-01-05 DIAGNOSIS — D6859 Other primary thrombophilia: Secondary | ICD-10-CM | POA: Diagnosis present

## 2015-01-05 DIAGNOSIS — F410 Panic disorder [episodic paroxysmal anxiety] without agoraphobia: Secondary | ICD-10-CM | POA: Diagnosis present

## 2015-01-05 DIAGNOSIS — I69351 Hemiplegia and hemiparesis following cerebral infarction affecting right dominant side: Secondary | ICD-10-CM | POA: Diagnosis not present

## 2015-01-05 DIAGNOSIS — E119 Type 2 diabetes mellitus without complications: Secondary | ICD-10-CM

## 2015-01-05 DIAGNOSIS — Z91199 Patient's noncompliance with other medical treatment and regimen due to unspecified reason: Secondary | ICD-10-CM

## 2015-01-05 DIAGNOSIS — G9340 Encephalopathy, unspecified: Secondary | ICD-10-CM

## 2015-01-05 DIAGNOSIS — R109 Unspecified abdominal pain: Secondary | ICD-10-CM

## 2015-01-05 DIAGNOSIS — Z8781 Personal history of (healed) traumatic fracture: Secondary | ICD-10-CM

## 2015-01-05 DIAGNOSIS — K219 Gastro-esophageal reflux disease without esophagitis: Secondary | ICD-10-CM | POA: Diagnosis present

## 2015-01-05 DIAGNOSIS — M79652 Pain in left thigh: Secondary | ICD-10-CM | POA: Diagnosis present

## 2015-01-05 DIAGNOSIS — Z95810 Presence of automatic (implantable) cardiac defibrillator: Secondary | ICD-10-CM

## 2015-01-05 DIAGNOSIS — I6930 Unspecified sequelae of cerebral infarction: Secondary | ICD-10-CM

## 2015-01-05 DIAGNOSIS — G40909 Epilepsy, unspecified, not intractable, without status epilepticus: Secondary | ICD-10-CM | POA: Diagnosis present

## 2015-01-05 DIAGNOSIS — I4891 Unspecified atrial fibrillation: Secondary | ICD-10-CM | POA: Diagnosis present

## 2015-01-05 DIAGNOSIS — I480 Paroxysmal atrial fibrillation: Secondary | ICD-10-CM | POA: Diagnosis present

## 2015-01-05 DIAGNOSIS — I482 Chronic atrial fibrillation: Secondary | ICD-10-CM | POA: Diagnosis not present

## 2015-01-05 DIAGNOSIS — I10 Essential (primary) hypertension: Secondary | ICD-10-CM

## 2015-01-05 LAB — CBC WITH DIFFERENTIAL/PLATELET
Basophils Absolute: 0 10*3/uL (ref 0.0–0.1)
Basophils Relative: 0 % (ref 0–1)
EOS ABS: 0.1 10*3/uL (ref 0.0–0.7)
EOS PCT: 1 % (ref 0–5)
HEMATOCRIT: 28.1 % — AB (ref 36.0–46.0)
HEMOGLOBIN: 8.1 g/dL — AB (ref 12.0–15.0)
Lymphocytes Relative: 16 % (ref 12–46)
Lymphs Abs: 1 10*3/uL (ref 0.7–4.0)
MCH: 24.9 pg — ABNORMAL LOW (ref 26.0–34.0)
MCHC: 28.8 g/dL — ABNORMAL LOW (ref 30.0–36.0)
MCV: 86.5 fL (ref 78.0–100.0)
Monocytes Absolute: 0.7 10*3/uL (ref 0.1–1.0)
Monocytes Relative: 12 % (ref 3–12)
Neutro Abs: 4.3 10*3/uL (ref 1.7–7.7)
Neutrophils Relative %: 70 % (ref 43–77)
PLATELETS: 221 10*3/uL (ref 150–400)
RBC: 3.25 MIL/uL — ABNORMAL LOW (ref 3.87–5.11)
RDW: 22.6 % — ABNORMAL HIGH (ref 11.5–15.5)
WBC: 6.1 10*3/uL (ref 4.0–10.5)

## 2015-01-05 LAB — COMPREHENSIVE METABOLIC PANEL
ALBUMIN: 2.9 g/dL — AB (ref 3.5–5.2)
ALK PHOS: 63 U/L (ref 39–117)
ALT: 9 U/L (ref 0–35)
AST: 23 U/L (ref 0–37)
Anion gap: 10 (ref 5–15)
BILIRUBIN TOTAL: 2 mg/dL — AB (ref 0.3–1.2)
BUN: 24 mg/dL — AB (ref 6–23)
CO2: 28 mmol/L (ref 19–32)
Calcium: 9.1 mg/dL (ref 8.4–10.5)
Chloride: 99 mmol/L (ref 96–112)
Creatinine, Ser: 2.33 mg/dL — ABNORMAL HIGH (ref 0.50–1.10)
GFR calc Af Amer: 26 mL/min — ABNORMAL LOW (ref >90)
GFR, EST NON AFRICAN AMERICAN: 22 mL/min — AB (ref >90)
GLUCOSE: 155 mg/dL — AB (ref 70–99)
POTASSIUM: 3.3 mmol/L — AB (ref 3.5–5.1)
Sodium: 137 mmol/L (ref 135–145)
TOTAL PROTEIN: 7.8 g/dL (ref 6.0–8.3)

## 2015-01-05 LAB — URINALYSIS, ROUTINE W REFLEX MICROSCOPIC
Glucose, UA: NEGATIVE mg/dL
Hgb urine dipstick: NEGATIVE
Ketones, ur: 15 mg/dL — AB
Leukocytes, UA: NEGATIVE
Nitrite: NEGATIVE
PROTEIN: 30 mg/dL — AB
Specific Gravity, Urine: 1.018 (ref 1.005–1.030)
Urobilinogen, UA: 2 mg/dL — ABNORMAL HIGH (ref 0.0–1.0)
pH: 5 (ref 5.0–8.0)

## 2015-01-05 LAB — I-STAT CHEM 8, ED
BUN: 28 mg/dL — ABNORMAL HIGH (ref 6–23)
CREATININE: 2.2 mg/dL — AB (ref 0.50–1.10)
Calcium, Ion: 1.1 mmol/L — ABNORMAL LOW (ref 1.12–1.23)
Chloride: 98 mmol/L (ref 96–112)
Glucose, Bld: 158 mg/dL — ABNORMAL HIGH (ref 70–99)
HCT: 35 % — ABNORMAL LOW (ref 36.0–46.0)
Hemoglobin: 11.9 g/dL — ABNORMAL LOW (ref 12.0–15.0)
Potassium: 3.4 mmol/L — ABNORMAL LOW (ref 3.5–5.1)
Sodium: 140 mmol/L (ref 135–145)
TCO2: 25 mmol/L (ref 0–100)

## 2015-01-05 LAB — I-STAT TROPONIN, ED: TROPONIN I, POC: 0.14 ng/mL — AB (ref 0.00–0.08)

## 2015-01-05 LAB — URINE MICROSCOPIC-ADD ON

## 2015-01-05 LAB — RAPID URINE DRUG SCREEN, HOSP PERFORMED
AMPHETAMINES: NOT DETECTED
Barbiturates: NOT DETECTED
Benzodiazepines: POSITIVE — AB
Cocaine: NOT DETECTED
Opiates: NOT DETECTED
TETRAHYDROCANNABINOL: NOT DETECTED

## 2015-01-05 LAB — PROTIME-INR
INR: 3.86 — ABNORMAL HIGH (ref 0.00–1.49)
Prothrombin Time: 38.2 seconds — ABNORMAL HIGH (ref 11.6–15.2)

## 2015-01-05 LAB — ETHANOL

## 2015-01-05 LAB — CBG MONITORING, ED: Glucose-Capillary: 145 mg/dL — ABNORMAL HIGH (ref 70–99)

## 2015-01-05 LAB — APTT: aPTT: 42 seconds — ABNORMAL HIGH (ref 24–37)

## 2015-01-05 LAB — TROPONIN I: Troponin I: 0.34 ng/mL — ABNORMAL HIGH (ref <0.031)

## 2015-01-05 MED ORDER — ONDANSETRON HCL 4 MG PO TABS: 4.0000 mg | ORAL_TABLET | Freq: Four times a day (QID) | ORAL | Status: DC | PRN

## 2015-01-05 MED ORDER — ONDANSETRON HCL 4 MG/2ML IJ SOLN: 4.0000 mg | Freq: Four times a day (QID) | INTRAMUSCULAR | Status: DC | PRN

## 2015-01-05 MED ORDER — OXYCODONE-ACETAMINOPHEN 10-325 MG PO TABS: 1.0000 | ORAL_TABLET | Freq: Four times a day (QID) | ORAL | Status: DC | PRN

## 2015-01-05 MED ORDER — ASPIRIN 325 MG PO TABS: 325.0000 mg | ORAL_TABLET | Freq: Once | ORAL | Status: DC

## 2015-01-05 MED ORDER — LINACLOTIDE 145 MCG PO CAPS
145.0000 ug | ORAL_CAPSULE | Freq: Every day | ORAL | Status: DC
  Administered 2015-01-06: 145 ug via ORAL
  Filled 2015-01-05: qty 1

## 2015-01-05 MED ORDER — LORAZEPAM 2 MG/ML IJ SOLN
1.0000 mg | Freq: Once | INTRAMUSCULAR | Status: AC
  Administered 2015-01-05: 1 mg via INTRAVENOUS
  Filled 2015-01-05: qty 1

## 2015-01-05 MED ORDER — MORPHINE SULFATE 2 MG/ML IJ SOLN
2.0000 mg | INTRAMUSCULAR | Status: DC | PRN
  Administered 2015-01-06 (×5): 2 mg via INTRAVENOUS
  Filled 2015-01-05 (×8): qty 1

## 2015-01-05 MED ORDER — SPIRONOLACTONE 25 MG PO TABS
25.0000 mg | ORAL_TABLET | Freq: Every day | ORAL | Status: DC
  Filled 2015-01-05: qty 1

## 2015-01-05 MED ORDER — LEVOTHYROXINE SODIUM 50 MCG PO TABS
50.0000 ug | ORAL_TABLET | Freq: Every day | ORAL | Status: DC
  Administered 2015-01-06 – 2015-01-08 (×3): 50 ug via ORAL
  Filled 2015-01-05 (×4): qty 1

## 2015-01-05 MED ORDER — SODIUM CHLORIDE 0.9 % IV SOLN
INTRAVENOUS | Status: DC
  Administered 2015-01-06: 01:00:00 via INTRAVENOUS

## 2015-01-05 MED ORDER — QUETIAPINE FUMARATE ER 50 MG PO TB24
150.0000 mg | ORAL_TABLET | Freq: Every day | ORAL | Status: DC
  Administered 2015-01-06: 150 mg via ORAL
  Filled 2015-01-05 (×4): qty 3

## 2015-01-05 MED ORDER — MORPHINE SULFATE 2 MG/ML IJ SOLN
2.0000 mg | Freq: Once | INTRAMUSCULAR | Status: AC
  Administered 2015-01-05: 2 mg via INTRAVENOUS
  Filled 2015-01-05: qty 1

## 2015-01-05 MED ORDER — ASPIRIN EC 81 MG PO TBEC: 81.0000 mg | DELAYED_RELEASE_TABLET | Freq: Every day | ORAL | Status: DC

## 2015-01-05 MED ORDER — POLYSACCHARIDE IRON COMPLEX 150 MG PO CAPS
150.0000 mg | ORAL_CAPSULE | Freq: Every day | ORAL | Status: DC
  Administered 2015-01-06: 150 mg via ORAL
  Filled 2015-01-05: qty 1

## 2015-01-05 MED ORDER — RIVAROXABAN 15 MG PO TABS
15.0000 mg | ORAL_TABLET | Freq: Every day | ORAL | Status: DC
  Administered 2015-01-06: 15 mg via ORAL
  Filled 2015-01-05 (×3): qty 1

## 2015-01-05 MED ORDER — PANTOPRAZOLE SODIUM 40 MG PO TBEC
40.0000 mg | DELAYED_RELEASE_TABLET | Freq: Every day | ORAL | Status: DC
  Administered 2015-01-06 – 2015-01-08 (×3): 40 mg via ORAL
  Filled 2015-01-05 (×4): qty 1

## 2015-01-05 MED ORDER — ALBUTEROL SULFATE (2.5 MG/3ML) 0.083% IN NEBU
2.5000 mg | INHALATION_SOLUTION | Freq: Four times a day (QID) | RESPIRATORY_TRACT | Status: DC | PRN
  Administered 2015-01-06 – 2015-01-08 (×2): 2.5 mg via RESPIRATORY_TRACT
  Filled 2015-01-05 (×2): qty 3

## 2015-01-05 MED ORDER — AMIODARONE HCL 200 MG PO TABS
200.0000 mg | ORAL_TABLET | ORAL | Status: DC
  Administered 2015-01-07: 200 mg via ORAL
  Filled 2015-01-05 (×3): qty 1

## 2015-01-05 MED ORDER — POTASSIUM CHLORIDE 20 MEQ/15ML (10%) PO SOLN
40.0000 meq | Freq: Once | ORAL | Status: DC
  Filled 2015-01-05: qty 30

## 2015-01-05 MED ORDER — ASPIRIN 300 MG RE SUPP
300.0000 mg | Freq: Once | RECTAL | Status: AC
  Administered 2015-01-05: 300 mg via RECTAL
  Filled 2015-01-05: qty 1

## 2015-01-05 MED ORDER — HYDRALAZINE HCL 20 MG/ML IJ SOLN: 5.0000 mg | INTRAMUSCULAR | Status: DC | PRN

## 2015-01-05 MED ORDER — LEVETIRACETAM 750 MG PO TABS
2000.0000 mg | ORAL_TABLET | Freq: Two times a day (BID) | ORAL | Status: DC
  Filled 2015-01-05: qty 2
  Filled 2015-01-05: qty 1

## 2015-01-05 MED ORDER — ALPRAZOLAM 0.5 MG PO TABS
1.0000 mg | ORAL_TABLET | Freq: Every evening | ORAL | Status: DC | PRN
  Administered 2015-01-06 – 2015-01-08 (×2): 1 mg via ORAL
  Filled 2015-01-05 (×2): qty 2

## 2015-01-05 MED ORDER — SODIUM CHLORIDE 0.9 % IV BOLUS (SEPSIS)
500.0000 mL | Freq: Once | INTRAVENOUS | Status: AC
  Administered 2015-01-05: 500 mL via INTRAVENOUS

## 2015-01-05 MED ORDER — ASPIRIN EC 81 MG PO TBEC
81.0000 mg | DELAYED_RELEASE_TABLET | Freq: Every day | ORAL | Status: DC
  Administered 2015-01-06 – 2015-01-07 (×2): 81 mg via ORAL
  Filled 2015-01-05 (×3): qty 1

## 2015-01-05 MED ORDER — ACETAMINOPHEN 325 MG PO TABS: 650.0000 mg | ORAL_TABLET | Freq: Four times a day (QID) | ORAL | Status: DC | PRN

## 2015-01-05 MED ORDER — POLYETHYLENE GLYCOL 3350 17 G PO PACK: 17.0000 g | PACK | Freq: Every day | ORAL | Status: DC | PRN

## 2015-01-05 MED ORDER — MORPHINE SULFATE 2 MG/ML IJ SOLN
2.0000 mg | Freq: Once | INTRAMUSCULAR | Status: AC
Start: 2015-01-05 — End: 2015-01-05
  Administered 2015-01-05: 2 mg via INTRAVENOUS
  Filled 2015-01-05: qty 1

## 2015-01-05 MED ORDER — SODIUM CHLORIDE 0.9 % IV SOLN
1000.0000 mg | Freq: Once | INTRAVENOUS | Status: AC
  Administered 2015-01-06: 1000 mg via INTRAVENOUS
  Filled 2015-01-05: qty 10

## 2015-01-05 MED ORDER — SODIUM CHLORIDE 0.9 % IJ SOLN
3.0000 mL | Freq: Two times a day (BID) | INTRAMUSCULAR | Status: DC
  Administered 2015-01-06 – 2015-01-07 (×3): 3 mL via INTRAVENOUS

## 2015-01-05 NOTE — ED Notes (Signed)
Spoke with Dr. Clyde Lundborg, consider changing bed to [facility].

## 2015-01-05 NOTE — ED Provider Notes (Signed)
CSN: [id number]     Arrival date & time 01/05/15  1545 History   First MD Initiated Contact with Patient 01/05/15 1555     Chief Complaint  Patient presents with  . Altered Mental Status     (Consider location/radiation/quality/duration/timing/severity/associated sxs/prior Treatment) HPI  Elizabeth Love is a 59 y.o. female with PMH of seizures, HTN, CVA with residual right-sided weakness, diabetes, congestive heart panel, atrial fibrillation on several toe, pacemaker presenting with after mental status. Patient arrives via EMS from home. Patient was found in her will try her in the bathroom unresponsive. Upon EMS arrival patient was unresponsive but slowly started to regain consciousness. Patient follows simple commands and is moaning and speak some words. Patient complaining of headache and generalized aches and pains. Per family patient is noncompliant with her Keppra. Level 5 caveat: AMS   Past Medical History  Diagnosis Date  . Seizures   . Hypertension   . Stroke     2007 - R sided weakness and expressive aphasia with h/o behavioral problems related to this  . Asthma   . Diabetes mellitus   . Chronic systolic CHF (congestive heart failure)   . Atrial fibrillation     a. On Xarelto after having DVT (prev felt to be poor anticoag cand 2/2 noncompliance).  . History of DVT (deep vein thrombosis)     a. 06/2012: RLE DVT, + protein C deficiency, placed on Xarelto. s/p IVC filter  . Noncompliance     Due to mental status, family has had to coax her to take medicines  . Hypothyroidism   . VT (ventricular tachycardia)     a. h/o ICD ~2007-2008. b. Adm 08/2012 with UTI, had VT/ICD shock x 5 (hypokalemic);  c. 06/2013 s/p ICD Gen change: SJM DC ICD ser #: [id number]  . Protein C deficiency   . NICM (nonischemic cardiomyopathy)     a. s/p AICD ~2008 ([employer]) - previous care [place]. No known hx CAD. b. EF 10% by echo 04/2012;  c. 06/2013 s/p ICD Gen change: SJM DC ICD ser #: [id number]  .  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 Panic attacks   . Anxiety    Past Surgical History  Procedure Laterality Date  . Cholecystectomy    . Appendectomy    . Pacemaker insertion      [employer]  . Vena cava filter placement  07/19/2012    Procedure: INSERTION VENA-CAVA FILTER;  Surgeon: Larina Earthly, MD;  Location: [facility] OR;  Service: Vascular;  Laterality: [name];  . Insert / replace / remove pacemaker  2014    ICD. [employer] Jude; inserted 2008 [facility]); gen change 2014 SK  . Abdominal hysterectomy    . Implantable cardioverter defibrillator generator change [name] 07/03/2013    Procedure: IMPLANTABLE CARDIOVERTER DEFIBRILLATOR GENERATOR CHANGE;  Surgeon: Duke Salvia, MD;  Location: [facility] CATH LAB;  Service: Cardiovascular;  Laterality: [name];   Family History  Problem Relation Age of Onset  . Hypertension Mother   . Coronary artery disease Neg Hx    History  Substance Use Topics  . Smoking status: Former Games developer  . Smokeless tobacco: Never Used  . Alcohol Use: No   OB History    No data available     Review of Systems  Unable to perform ROS AMS    Allergies  Penicillins and Sulfa antibiotics  Home Medications   Prior to Admission medications   Medication Sig Start Date  End Date Taking? Authorizing Provider  amiodarone (PACERONE) 200 MG tablet Take 1 tablet (200 mg total) by mouth See admin instructions. Takes 200mg  on M,T,W,Th,F only Skips doses on Sat and Sun 10/31/14  Yes Maryann Mikhail, DO  furosemide (LASIX) 40 MG tablet Take 40 mg by mouth 2 (two) times daily.   Yes Historical Provider, MD  levETIRAcetam (KEPPRA) 1000 MG tablet Take 2 tablets (2,000 mg total) by mouth 2 (two) times daily. 10/31/14  Yes Maryann Mikhail, DO  levothyroxine (SYNTHROID, LEVOTHROID) 50 MCG tablet Take 1 tablet (50 mcg total) by mouth daily before breakfast. 10/31/14  Yes Maryann Mikhail, DO  lisinopril (PRINIVIL,ZESTRIL) 5 MG tablet Take 5 mg by mouth daily.   Yes Historical Provider, MD   rivaroxaban (XARELTO) 20 MG TABS tablet Take 20 mg by mouth daily with supper.   Yes Historical Provider, MD  spironolactone (ALDACTONE) 25 MG tablet Take 25 mg by mouth daily.   Yes Historical Provider, MD  Vitamin D, Ergocalciferol, (DRISDOL) 50000 UNITS CAPS capsule Take 50,000 Units by mouth every 7 (seven) days.   Yes Historical Provider, MD  acetaminophen (TYLENOL) 325 MG tablet Take 650 mg by mouth every 6 (six) hours as needed for pain.    Historical Provider, MD  albuterol (PROVENTIL) (2.5 MG/3ML) 0.083% nebulizer solution Take 2.5 mg by nebulization every 6 (six) hours as needed. For cough or wheeze    Historical Provider, MD  ALPRAZolam Prudy Feeler) 1 MG tablet Take 1 tablet (1 mg total) by mouth at bedtime as needed for anxiety. 03/24/14   Ripudeep Jenna Luo, MD  insulin lispro (HUMALOG) 100 UNIT/ML injection Inject into the skin 3 (three) times daily before meals. On sliding scale    Historical Provider, MD  iron polysaccharides (NIFEREX) 150 MG capsule Take 1 capsule (150 mg total) by mouth daily. 10/31/14   Maryann Mikhail, DO  Linaclotide (LINZESS) 145 MCG CAPS capsule Take 1 capsule (145 mcg total) by mouth daily. 10/31/14   Maryann Mikhail, DO  oxyCODONE-acetaminophen (PERCOCET) 10-325 MG per tablet Take 1 tablet by mouth every 6 (six) hours as needed for pain. 04/17/14   Jeralyn Bennett, MD  pantoprazole (PROTONIX) 40 MG tablet Take 1 tablet (40 mg total) by mouth daily. 10/31/14   Maryann Mikhail, DO  polyethylene glycol (MIRALAX / GLYCOLAX) packet Take 17 g by mouth daily as needed (constipation).     Historical Provider, MD  QUEtiapine Fumarate (SEROQUEL XR) 150 MG 24 hr tablet Take 1 tablet (150 mg total) by mouth at bedtime. 10/31/14   Maryann Mikhail, DO  Rivaroxaban (XARELTO) 15 MG TABS tablet Take 1 tablet (15 mg total) by mouth daily with supper. 10/31/14   Maryann Mikhail, DO   BP 108/76 mmHg  Pulse 79  Temp(Src) 97.9 F (36.6 C) (Rectal)  Resp 22  SpO2 96% Physical Exam   Constitutional: She appears well-developed and well-nourished.  Patient moaning and turning her head side to side.  HENT:  Head: Normocephalic and atraumatic.  Eyes: Conjunctivae and EOM are normal. Pupils are equal, round, and reactive to light. Right eye exhibits no discharge. Left eye exhibits no discharge.  Cardiovascular: Normal rate and regular rhythm.   Pulmonary/Chest: Effort normal and breath sounds normal. No respiratory distress. She has no wheezes.  Abdominal: Soft. Bowel sounds are normal. She exhibits no distension. There is no tenderness.  Neurological: She is alert. She exhibits normal muscle tone. Coordination normal.  Patient responds to simple commands and says some one-word phrases that appear to be appropriate.  No facial droop. Pt does not move right extremities spontaneously but does with left.  Skin: Skin is warm and dry.  Nursing note and vitals reviewed.   ED Course  Procedures (including critical care time) Labs Review Labs Reviewed  PROTIME-INR - Abnormal; Notable for the following:    Prothrombin Time 38.2 (*)    INR 3.86 (*)    All other components within normal limits  APTT - Abnormal; Notable for the following:    aPTT 42 (*)    All other components within normal limits  COMPREHENSIVE METABOLIC PANEL - Abnormal; Notable for the following:    Potassium 3.3 (*)    Glucose, Bld 155 (*)    BUN 24 (*)    Creatinine, Ser 2.33 (*)    Albumin 2.9 (*)    Total Bilirubin 2.0 (*)    GFR calc non Af Amer 22 (*)    GFR calc Af Amer 26 (*)    All other components within normal limits  URINE RAPID DRUG SCREEN (HOSP PERFORMED) - Abnormal; Notable for the following:    Benzodiazepines POSITIVE (*)    All other components within normal limits  URINALYSIS, ROUTINE W REFLEX MICROSCOPIC - Abnormal; Notable for the following:    Color, Urine AMBER (*)    APPearance CLOUDY (*)    Bilirubin Urine MODERATE (*)    Ketones, ur 15 (*)    Protein, ur 30 (*)     Urobilinogen, UA 2.0 (*)    All other components within normal limits  CBC WITH DIFFERENTIAL/PLATELET - Abnormal; Notable for the following:    RBC 3.25 (*)    Hemoglobin 8.1 (*)    HCT 28.1 (*)    MCH 24.9 (*)    MCHC 28.8 (*)    RDW 22.6 (*)    All other components within normal limits  TROPONIN I - Abnormal; Notable for the following:    Troponin I 0.34 (*)    All other components within normal limits  URINE MICROSCOPIC-ADD ON - Abnormal; Notable for the following:    Crystals CA OXALATE CRYSTALS (*)    All other components within normal limits  CBG MONITORING, ED - Abnormal; Notable for the following:    Glucose-Capillary 145 (*)    All other components within normal limits  I-STAT CHEM 8, ED - Abnormal; Notable for the following:    Potassium 3.4 (*)    BUN 28 (*)    Creatinine, Ser 2.20 (*)    Glucose, Bld 158 (*)    Calcium, Ion 1.10 (*)    Hemoglobin 11.9 (*)    HCT 35.0 (*)    All other components within normal limits  I-STAT TROPOININ, ED - Abnormal; Notable for the following:    Troponin i, poc 0.14 (*)    All other components within normal limits  ETHANOL  LEVETIRACETAM LEVEL    Imaging Review Ct Head Wo Contrast  01/05/2015   CLINICAL DATA:  Found unresponsive sitting in wheelchair. Previous history of seizures and stroke.  EXAM: CT HEAD WITHOUT CONTRAST  TECHNIQUE: Contiguous axial images were obtained from the base of the skull through the vertex without intravenous contrast.  COMPARISON:  10/29/2014  FINDINGS: There is old infarction throughout the left middle cerebral artery territory with atrophy, encephalomalacia and adjacent gliosis. There is no evidence of acute infarction, mass lesion, hemorrhage, hydrocephalus or extra-axial collection. No calvarial abnormality. Sinuses are clear. There is atherosclerotic calcification of the major vessels at the base of the brain.  Tiny air bubbles in the soft tissues of the right face and temporal region could either be  due to a laceration, air bubbles an pains related to intravenous wind sub, or conceivably due to infectious inflammation.  IMPRESSION: No acute intracranial finding.  Old left MCA territory infarction.  Small air bubbles in the soft tissues of the right temporal region. Usually, this is insignificant and relates to venous air bubbles from intravenous lines. See above discussion   Electronically Signed   By: Paulina Fusi M.D.   On: 01/05/2015 20:51   Dg Chest Portable 1 View  01/05/2015   CLINICAL DATA:  Chest pain, lower abdominal pain  EXAM: PORTABLE CHEST - 1 VIEW  COMPARISON:  10/30/2014  FINDINGS: Significant cardiomegaly again noted. Dual lead cardiac pacemaker is unchanged in position. Stable small right pleural effusion. Central mild vascular congestion without convincing pulmonary edema. Mild basilar atelectasis. Stable small fluid in right minor fissure. No segmental infiltrate  IMPRESSION: Significant cardiomegaly. Dual lead cardiac pacemaker is unchanged in position. Central mild vascular congestion without convincing pulmonary edema. Small right pleural effusion. Mild basilar atelectasis. No segmental infiltrate.   Electronically Signed   By: Natasha Mead M.D.   On: 01/05/2015 17:26     EKG Interpretation   Date/Time:  Tuesday January 05 2015 17:26:16 EDT Ventricular Rate:  88 PR Interval:  212 QRS Duration: 126 QT Interval:  426 QTC Calculation: 515 R Axis:   47 Text Interpretation:  Sinus rhythm Multiple premature complexes, vent   Prolonged PR interval Probable left atrial enlargement Left bundle branch  block no significant change since earlier in the day Confirmed by GOLDSTON   MD, Peretz ([id number]) on 01/05/2015 8:58:20 PM      MDM   Final diagnoses:  AKI (acute kidney injury)  Altered mental status, unspecified altered mental status type   Patient found unresponsive by family member and presented via EMS to the ED. Patient with history of seizures and noncompliant on Keppra. Per  son this happens frequently and patient is neurologically at her baseline. VSS. Patient was residual deficits on right side that is responding to questions with short phrases and following commands. Lab work significant for AKI elevated troponin. Patient complaining of diffuse pain not localized to chest. CXR with congestion without pulmonary edema. Small right pleural effusion. Awaiting results of head CT for administering aspirin. Head CT without evidence of acute infarct or hemorrhage. Patient unable to swallow and given aspirin suppository suppository. Consult to hospitalists for admission and further workup. Spoke with Dr. Roda Shutters who agrees to evaluate patient with plan for admission.  Discussed all results and patient verbalizes understanding and agrees with plan.  This is a shared patient. This patient was discussed with the physician who saw and evaluated the patient and agrees with the plan.   Oswaldo Conroy, PA-C 01/05/15 2245  Pricilla Loveless, MD 01/09/15 [phone]

## 2015-01-05 NOTE — ED Notes (Signed)
Discussed plan of care with Tori, [place], and Dr. Criss Alvine. Patient failed swallow screen, requesting meds for headache.

## 2015-01-05 NOTE — ED Notes (Signed)
Spoke to bed control to discuss change in bed request to 4N.

## 2015-01-05 NOTE — ED Notes (Signed)
NOTIFIED CASSIE ,RN FOR PATIENTS LAB RESULTS OF TROPONIN  01/05/2015.

## 2015-01-05 NOTE — ED Notes (Signed)
Pt arrives via EMS from home. Family found pt in her wheelchair in the bathroom unresponsive. Family reports hx of seizures and stroke which left rt sided deficits. Upon EMS arrival pt was unresponsive and slowly started arising during transport. Currently pt is moaning but following commands, able to speak some words. Family reports that pt is non-compliant with her Keppra.

## 2015-01-05 NOTE — ED Notes (Signed)
Attempted to call report to 4N

## 2015-01-05 NOTE — H&P (Signed)
[facility] History and Physical  Elizabeth Love HKV:[id number] DOB: [birth year] DOA: 01/05/2015  Referring physician: ED physician PCP: No primary care provider on file.  Specialists:   Chief Complaint: Altered mental status, left thigh pain, abdominal pain  HPI: Elizabeth Love is a 59 y.o. female with past medical history hypertension, diabetes mellitus, GERD, hypothyroidism, panic attack, anxiety, AICD, seizure, stroke, systolic congestive heart failure, atrial fibrillation on Xarelto, protein C deficiency, chronic kidney disease-stage III, asthma, right lower extremity DVT, who presents with altered mental status, left thigh pain and abdominal pain.  Patient was still very drowsy and mildly confused when I saw patient in ED. The history is obtained from ED report. It seems that patient was found in her bathroom unresponsive. Upon EMS arrival patient was unresponsive, but slowly started to regain consciousness. Not sure whether patient had seizure or not. She does not seem to have unilateral weakness. Per family patient is noncompliant with her Keppra. When I saw patient in ED, she complains of left thigh pain and abdominal pain. Her troponin is elevated at 0.34, but no any chest pain or shortness of breath.  ROS: currently patient dose not seem to have fever, chills, running nose, ear pain, cough, chest pain, SOB, diarrhea, constipation, dysuria, urgency, frequency, hematuria, skin rashes or leg swelling. No unilateral weakness, numbness or tingling sensations. No vision change or hearing loss.  In ED, patient was found to have negative urinalysis, INR 3.86, UDS positive for benzo, AoCKD-III, temperature 97.9, no tachycardia, WBC 6.1, alcohol level less than 5, potassium is 3.3. CT head is negative for acute abnormalities, but showed old left MCA infarction. Chest x-ray showed mild vascular congestion, and small amount of right pleural effusion. Patient is admitted to inpatient for further  evaluation and treatment.  Review of Systems: As presented in the history of presenting illness, rest negative.  Where does patient live?  At home Can patient participate in ADLs? some  Allergy:  Allergies  Allergen Reactions  . Penicillins Rash  . Sulfa Antibiotics Rash    Past Medical History  Diagnosis Date  . Seizures   . Hypertension   . Stroke     2007 - R sided weakness and expressive aphasia with h/o behavioral problems related to this  . Asthma   . Diabetes mellitus   . Chronic systolic CHF (congestive heart failure)   . Atrial fibrillation     a. On Xarelto after having DVT (prev felt to be poor anticoag cand 2/2 noncompliance).  . History of DVT (deep vein thrombosis)     a. 06/2012: RLE DVT, + protein C deficiency, placed on Xarelto. s/p IVC filter  . Noncompliance     Due to mental status, family has had to coax her to take medicines  . Hypothyroidism   . VT (ventricular tachycardia)     a. h/o ICD ~2007-2008. b. Adm 08/2012 with UTI, had VT/ICD shock x 5 (hypokalemic);  c. 06/2013 s/p ICD Gen change: SJM DC ICD ser #: [id number]  . Protein C deficiency   . NICM (nonischemic cardiomyopathy)     a. s/p AICD ~2008 ([employer]) - previous care [place]. No known hx CAD. b. EF 10% by echo 04/2012;  c. 06/2013 s/p ICD Gen change: SJM DC ICD ser #: [id number]  . 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 Panic attacks   . Anxiety     Past Surgical History  Procedure Laterality Date  . Cholecystectomy    .  Appendectomy    . Pacemaker insertion      [employer]  . Vena cava filter placement  07/19/2012    Procedure: INSERTION VENA-CAVA FILTER;  Surgeon: Larina Earthly, MD;  Location: [facility] OR;  Service: Vascular;  Laterality: [name];  . Insert / replace / remove pacemaker  2014    ICD. [employer] Jude; inserted 2008 [facility]); gen change 2014 SK  . Abdominal hysterectomy    . Implantable cardioverter defibrillator generator change [name] 07/03/2013    Procedure: IMPLANTABLE  CARDIOVERTER DEFIBRILLATOR GENERATOR CHANGE;  Surgeon: Duke Salvia, MD;  Location: [facility] CATH LAB;  Service: Cardiovascular;  Laterality: [name];    Social History:  reports that she has quit smoking. She has never used smokeless tobacco. She reports that she does not drink alcohol or use illicit drugs.  Family History:  Family History  Problem Relation Age of Onset  . Hypertension Mother   . Coronary artery disease Neg Hx      Prior to Admission medications   Medication Sig Start Date End Date Taking? Authorizing Provider  amiodarone (PACERONE) 200 MG tablet Take 1 tablet (200 mg total) by mouth See admin instructions. Takes  on M,T,W,Th,F only Skips doses on Sat and Sun 10/31/14  Yes Maryann Mikhail, DO  furosemide (LASIX) 40 MG tablet Take 40 mg by mouth 2 (two) times daily.   Yes Historical Provider, MD  levETIRAcetam (KEPPRA) 1000 MG tablet Take 2 tablets (2,000 mg total) by mouth 2 (two) times daily. 10/31/14  Yes Maryann Mikhail, DO  levothyroxine (SYNTHROID, LEVOTHROID) 50 MCG tablet Take 1 tablet (50 mcg total) by mouth daily before breakfast. 10/31/14  Yes Maryann Mikhail, DO  lisinopril (PRINIVIL,ZESTRIL) 5 MG tablet Take 5 mg by mouth daily.   Yes Historical Provider, MD  rivaroxaban (XARELTO) 20 MG TABS tablet Take 20 mg by mouth daily with supper.   Yes Historical Provider, MD  spironolactone (ALDACTONE) 25 MG tablet Take 25 mg by mouth daily.   Yes Historical Provider, MD  Vitamin D, Ergocalciferol, (DRISDOL) 50000 UNITS CAPS capsule Take 50,000 Units by mouth every 7 (seven) days.   Yes Historical Provider, MD  acetaminophen (TYLENOL) 325 MG tablet Take 650 mg by mouth every 6 (six) hours as needed for pain.    Historical Provider, MD  albuterol (PROVENTIL) (2.5 MG/3ML) 0.083% nebulizer solution Take 2.5 mg by nebulization every 6 (six) hours as needed. For cough or wheeze    Historical Provider, MD  ALPRAZolam Prudy Feeler) 1 MG tablet Take 1 tablet (1 mg total) by mouth at bedtime  as needed for anxiety. 03/24/14   Ripudeep Jenna Luo, MD  insulin lispro (HUMALOG) 100 UNIT/ML injection Inject into the skin 3 (three) times daily before meals. On sliding scale    Historical Provider, MD  iron polysaccharides (NIFEREX) 150 MG capsule Take 1 capsule (150 mg total) by mouth daily. 10/31/14   Maryann Mikhail, DO  Linaclotide (LINZESS) 145 MCG CAPS capsule Take 1 capsule (145 mcg total) by mouth daily. 10/31/14   Maryann Mikhail, DO  oxyCODONE-acetaminophen (PERCOCET) 10-325 MG per tablet Take 1 tablet by mouth every 6 (six) hours as needed for pain. 04/17/14   Jeralyn Bennett, MD  pantoprazole (PROTONIX) 40 MG tablet Take 1 tablet (40 mg total) by mouth daily. 10/31/14   Maryann Mikhail, DO  polyethylene glycol (MIRALAX / GLYCOLAX) packet Take 17 g by mouth daily as needed (constipation).     Historical Provider, MD  QUEtiapine Fumarate (SEROQUEL XR) 150 MG 24 hr  tablet Take 1 tablet (150 mg total) by mouth at bedtime. 10/31/14   Maryann Mikhail, DO  Rivaroxaban (XARELTO) 15 MG TABS tablet Take 1 tablet (15 mg total) by mouth daily with supper. 10/31/14   Edsel Petrin, DO    Physical Exam: Filed Vitals:   01/05/15 2145 01/05/15 2202 01/05/15 2243 01/05/15 2330  BP: 120/88 108/76 126/70 112/81  Pulse:  79 95 86  Temp:   97.7 F (36.5 C) 97.7 F (36.5 C)  TempSrc:   Oral Oral  Resp: 24 22 22 20   Weight:    62.2 kg (137 lb 2 oz)  SpO2:  96% 99% 100%   General: Not in acute distress HEENT:       Eyes: PERRL, EOMI, no scleral icterus       ENT: No discharge from the ears and nose, no pharynx injection, no tonsillar enlargement.        Neck: No JVD, no bruit, no mass felt. Cardiac: S1/S2, RRR, No murmurs, No gallops or rubs Pulm: Good air movement bilaterally. Clear to auscultation bilaterally. No rales, wheezing, rhonchi or rubs. Abd: Soft, nondistended, mild tenderness diffusely, no rebound pain, no organomegaly, BS present Ext: No edema bilaterally. 2+DP/PT pulse  bilaterally Musculoskeletal: severe tenderness over left upper thigh, no obvious injury or swelling over the same area. Skin: No rashes.  Neuro: drowsy, not oriented X3, cranial nerves II-XII grossly intact, muscle strength 5/5 in all extremeties, sensation to light touch intact. Brachial reflex 1+ bilaterally. Knee reflex 1+ bilaterally. Negative Babinski's sign. Unable to perform finger to nose test due to confusion. Psych: Patient is not psychotic, no suicidal or hemocidal ideation.  Labs on Admission:  Basic Metabolic Panel:  Recent Labs Lab 01/05/15 1606 01/05/15 1634  NA 137 140  K 3.3* 3.4*  CL 99 98  CO2 28  --   GLUCOSE 155* 158*  BUN 24* 28*  CREATININE 2.33* 2.20*  CALCIUM 9.1  --    Liver Function Tests:  Recent Labs Lab 01/05/15 1606  AST 23  ALT 9  ALKPHOS 63  BILITOT 2.0*  PROT 7.8  ALBUMIN 2.9*    Recent Labs Lab 01/06/15 0013  LIPASE 15   No results for input(s): AMMONIA in the last 168 hours. CBC:  Recent Labs Lab 01/05/15 1606 01/05/15 1634  WBC 6.1  --   NEUTROABS 4.3  --   HGB 8.1* 11.9*  HCT 28.1* 35.0*  MCV 86.5  --   PLT 221  --    Cardiac Enzymes:  Recent Labs Lab 01/05/15 1606 01/06/15 0013  TROPONINI 0.34* 0.33*    BNP (last 3 results)  Recent Labs  10/30/14 0102  BNP 2965.9*    ProBNP (last 3 results)  Recent Labs  01/10/14 0912 03/21/14 0633 04/15/14 0135  PROBNP 7500.0* 11633.0* 10371.0*    CBG:  Recent Labs Lab 01/05/15 1603  GLUCAP 145*    Radiological Exams on Admission: Ct Head Wo Contrast  01/05/2015   CLINICAL DATA:  Found unresponsive sitting in wheelchair. Previous history of seizures and stroke.  EXAM: CT HEAD WITHOUT CONTRAST  TECHNIQUE: Contiguous axial images were obtained from the base of the skull through the vertex without intravenous contrast.  COMPARISON:  10/29/2014  FINDINGS: There is old infarction throughout the left middle cerebral artery territory with atrophy,  encephalomalacia and adjacent gliosis. There is no evidence of acute infarction, mass lesion, hemorrhage, hydrocephalus or extra-axial collection. No calvarial abnormality. Sinuses are clear. There is atherosclerotic calcification of the major  vessels at the base of the brain.  Tiny air bubbles in the soft tissues of the right face and temporal region could either be due to a laceration, air bubbles an pains related to intravenous wind sub, or conceivably due to infectious inflammation.  IMPRESSION: No acute intracranial finding.  Old left MCA territory infarction.  Small air bubbles in the soft tissues of the right temporal region. Usually, this is insignificant and relates to venous air bubbles from intravenous lines. See above discussion   Electronically Signed   By: Paulina Fusi M.D.   On: 01/05/2015 20:51   Dg Chest Portable 1 View  01/05/2015   CLINICAL DATA:  Chest pain, lower abdominal pain  EXAM: PORTABLE CHEST - 1 VIEW  COMPARISON:  10/30/2014  FINDINGS: Significant cardiomegaly again noted. Dual lead cardiac pacemaker is unchanged in position. Stable small right pleural effusion. Central mild vascular congestion without convincing pulmonary edema. Mild basilar atelectasis. Stable small fluid in right minor fissure. No segmental infiltrate  IMPRESSION: Significant cardiomegaly. Dual lead cardiac pacemaker is unchanged in position. Central mild vascular congestion without convincing pulmonary edema. Small right pleural effusion. Mild basilar atelectasis. No segmental infiltrate.   Electronically Signed   By: Natasha Mead M.D.   On: 01/05/2015 17:26    EKG: Independently reviewed.  Abnormal findings:  First degree A-V block, PVC, left bundle block H, nonspecific T-wave change, which are similar to previous EKG. Assessment/Plan Principal Problem:   Acute encephalopathy Active Problems:   Hypothyroidism   Hypokalemia   Protein C deficiency   Noncompliance   DM type 2 (diabetes mellitus, type 2)    Automatic implantable cardioverter-defibrillator in situ   History of DVT (deep vein thrombosis)   History of stroke with residual effects   Hypertension   Chronic systolic CHF (congestive heart failure)   Atrial fibrillation   Seizures   Acute renal failure superimposed on stage 3 chronic kidney disease   Abdominal pain   Left thigh pain  Acute encephalopathy: Etiology is not clear. CT head is negative for acute abnormalities. No signs of stroke. No signs of infection. It is likely due to seizure since patient has not been compliant to her medications, Keppra per family members. Her mental status already improved in ED. Other possible etiologies include electrolyte disturbance, acute on chronic renal injury and ACS given elevated troponin -will admit to tele bed -give one dose of keppra by IV -neuro check q4h -npo until SLP done  Seizure: Patient has not been complaining to her medications, Keppra at home. Patient may have had seizure episode which caused acute encephalopathy. -Give one dose of Keppra, 1000 mg 1 by IV  -continue Keppra, 2000 mg twice a day -Check Keppra level  Hypothyroidism: TSH was 7.371 on 10/30/14.  -continue Synthroid -check TSH  Hypokalemia -Repleted  AoCKD-III: Baseline creatinine is 1.7. Her creatinine is 2.33, BUN 24 on admission. Etiology is not clear, may be multifactorial, including pre-renal failurel, and possible rhabdo secondary to possible seizure. -Hold Lasix, lisinopril -check FeUrea, renal ultrasound -Continue IV fluid: Normal saline 50 mL per hour -check CK  Systolic congestive heart failure: 2-D echo on 03/21/14 showed EF of 15%. Patient is taking Lasix 40 mg twice a day. Her volume status seems to be okay on admission. -Hold her Lasix due to AoCKD -check BNP -Continue aspirin -Continue spironolactone  GERD -Protonix  Atrial Fibrillation: CHA2DS2-VASc Score is 6, needs oral anticoagulation. Patient is on  Xarelto at home. INR is 3.86 on  admission.  Heart rate is well controlled. -continue Xarelto -Continue amiodarone  History of right lower extremity DVT: -On Xarelto  Left thigh pain: No obvious injury. Likely related to the possible seizure. -X-ray of left femur -pain control  Abdominal pain: Etiology is unclear. Lipase is negative. -CT abdomen/pelvis  Diabetes mellitus: A1c was 7.0 on 10/30/14. Fairly controlled. Patient is taking Humalog at home.  -sliding scale insulin  Elevated troponin: Patient does not have any chest pain. EKG did not show new acute change. It is likely due to possible seizure and worsening renal function. -Troponin 3  - repeat EKG in the morning. - Aspirin   DVT ppx: on Xarelto Code Status: Full code Family Communication: None at bed side.          Disposition Plan: Admit to inpatient   Date of Service 01/06/2015    Lorretta Harp [facility] Pager [phone]  If 7PM-7AM, please contact night-coverage [email or web address] Password [email or web address] 01/06/2015, 2:00 AM

## 2015-01-06 ENCOUNTER — Inpatient Hospital Stay (HOSPITAL_COMMUNITY): Payer: Medicare Other

## 2015-01-06 DIAGNOSIS — I5021 Acute systolic (congestive) heart failure: Secondary | ICD-10-CM

## 2015-01-06 DIAGNOSIS — I480 Paroxysmal atrial fibrillation: Secondary | ICD-10-CM

## 2015-01-06 DIAGNOSIS — I5023 Acute on chronic systolic (congestive) heart failure: Secondary | ICD-10-CM

## 2015-01-06 LAB — GLUCOSE, CAPILLARY
GLUCOSE-CAPILLARY: 86 mg/dL (ref 70–99)
GLUCOSE-CAPILLARY: 78 mg/dL (ref 70–99)
Glucose-Capillary: 85 mg/dL (ref 70–99)
Glucose-Capillary: 134 mg/dL — ABNORMAL HIGH (ref 70–99)

## 2015-01-06 LAB — COMPREHENSIVE METABOLIC PANEL
ALK PHOS: 56 U/L (ref 39–117)
ALT: 9 U/L (ref 0–35)
AST: 25 U/L (ref 0–37)
Albumin: 2.8 g/dL — ABNORMAL LOW (ref 3.5–5.2)
Anion gap: 15 (ref 5–15)
BILIRUBIN TOTAL: 2.3 mg/dL — AB (ref 0.3–1.2)
BUN: 24 mg/dL — ABNORMAL HIGH (ref 6–23)
CHLORIDE: 101 mmol/L (ref 96–112)
CO2: 22 mmol/L (ref 19–32)
Calcium: 9 mg/dL (ref 8.4–10.5)
Creatinine, Ser: 2.1 mg/dL — ABNORMAL HIGH (ref 0.50–1.10)
GFR, EST AFRICAN AMERICAN: 29 mL/min — AB (ref >90)
GFR, EST NON AFRICAN AMERICAN: 25 mL/min — AB (ref >90)
GLUCOSE: 84 mg/dL (ref 70–99)
POTASSIUM: 3.6 mmol/L (ref 3.5–5.1)
Sodium: 138 mmol/L (ref 135–145)
TOTAL PROTEIN: 7.8 g/dL (ref 6.0–8.3)

## 2015-01-06 LAB — CBC
HEMATOCRIT: 27.2 % — AB (ref 36.0–46.0)
HEMOGLOBIN: 7.7 g/dL — AB (ref 12.0–15.0)
MCH: 24.9 pg — ABNORMAL LOW (ref 26.0–34.0)
MCHC: 28.3 g/dL — ABNORMAL LOW (ref 30.0–36.0)
MCV: 88 fL (ref 78.0–100.0)
PLATELETS: 211 10*3/uL (ref 150–400)
RBC: 3.09 MIL/uL — ABNORMAL LOW (ref 3.87–5.11)
RDW: 22.5 % — ABNORMAL HIGH (ref 11.5–15.5)
WBC: 6.4 10*3/uL (ref 4.0–10.5)

## 2015-01-06 LAB — LIPASE, BLOOD: Lipase: 15 U/L (ref 11–59)

## 2015-01-06 LAB — TROPONIN I
TROPONIN I: 0.33 ng/mL — AB (ref <0.031)
Troponin I: 0.32 ng/mL — ABNORMAL HIGH (ref <0.031)
Troponin I: 0.33 ng/mL — ABNORMAL HIGH (ref <0.031)

## 2015-01-06 LAB — CK: Total CK: 173 U/L (ref 7–177)

## 2015-01-06 LAB — TSH: TSH: 6.406 u[IU]/mL — AB (ref 0.350–4.500)

## 2015-01-06 MED ORDER — LORAZEPAM 2 MG/ML IJ SOLN
0.5000 mg | Freq: Once | INTRAMUSCULAR | Status: AC
  Administered 2015-01-06: 0.5 mg via INTRAVENOUS
  Filled 2015-01-06: qty 1

## 2015-01-06 MED ORDER — FUROSEMIDE 10 MG/ML IJ SOLN
80.0000 mg | Freq: Two times a day (BID) | INTRAMUSCULAR | Status: DC
  Administered 2015-01-06 – 2015-01-08 (×4): 80 mg via INTRAVENOUS
  Filled 2015-01-06 (×4): qty 8

## 2015-01-06 MED ORDER — INSULIN ASPART 100 UNIT/ML ~~LOC~~ SOLN: 0.0000 [IU] | Freq: Three times a day (TID) | SUBCUTANEOUS | Status: DC

## 2015-01-06 MED ORDER — OXYCODONE HCL 5 MG PO TABS
5.0000 mg | ORAL_TABLET | Freq: Four times a day (QID) | ORAL | Status: DC | PRN
  Filled 2015-01-06: qty 1

## 2015-01-06 MED ORDER — OXYCODONE HCL 5 MG PO TABS
10.0000 mg | ORAL_TABLET | Freq: Four times a day (QID) | ORAL | Status: DC | PRN
  Administered 2015-01-06: 15 mg via ORAL
  Administered 2015-01-07: 10 mg via ORAL
  Administered 2015-01-07 – 2015-01-08 (×2): 15 mg via ORAL
  Filled 2015-01-06 (×3): qty 3
  Filled 2015-01-06 (×3): qty 2

## 2015-01-06 MED ORDER — OXYCODONE-ACETAMINOPHEN 5-325 MG PO TABS: 1.0000 | ORAL_TABLET | Freq: Four times a day (QID) | ORAL | Status: DC | PRN

## 2015-01-06 MED ORDER — CHLORHEXIDINE GLUCONATE 0.12 % MT SOLN
15.0000 mL | Freq: Two times a day (BID) | OROMUCOSAL | Status: DC
  Administered 2015-01-06 – 2015-01-08 (×3): 15 mL via OROMUCOSAL
  Filled 2015-01-06 (×3): qty 15

## 2015-01-06 MED ORDER — CETYLPYRIDINIUM CHLORIDE 0.05 % MT LIQD
7.0000 mL | Freq: Two times a day (BID) | OROMUCOSAL | Status: DC
  Administered 2015-01-06 – 2015-01-08 (×2): 7 mL via OROMUCOSAL

## 2015-01-06 MED ORDER — SODIUM CHLORIDE 0.9 % IV SOLN
1000.0000 mg | Freq: Two times a day (BID) | INTRAVENOUS | Status: DC
  Administered 2015-01-06 (×2): 1000 mg via INTRAVENOUS
  Filled 2015-01-06 (×4): qty 10

## 2015-01-06 NOTE — Progress Notes (Signed)
CARE MANAGEMENT NOTE 01/06/2015  Patient:  Elizabeth Love, Elizabeth Love   Account Number:  [id number]  Date Initiated:  01/06/2015  Documentation initiated by:  Jiles Crocker  Subjective/Objective Assessment:   ADMITTED WITH ACUTE ENCEPHALOPATHY, SEIZURE     Action/Plan:   CM FOLOWING FOR DCP   Anticipated DC Date:  01/09/2015   Anticipated DC Plan:  AWAITING FOR PT/OT EVALS FOR DISPOSITON NEEDS    DC Planning Services  CM consult        Status of service:  In process, will continue to follow  Per UR Regulation:  Reviewed for med. necessity/level of care/duration of stay  Comments:  4/20/2016[name] RN,BSN,MHA [phone]

## 2015-01-06 NOTE — Consult Note (Signed)
Advanced Heart Failure Team Consult Note    Reason for Consult: Heart failure   HPI:    Elizabeth Love is 59 year old with a h/o chronic systolic heart failure due to NICM (EF 15% 7/20135), status post St. Jude ICD implantation (placed [facility] 2008 was followed by MD in [place]), hypothyroidism, seizure disorder, HTN, DM2. 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   Admitted to [facility] 01/06/13 with volume overload in setting of medication noncompliance. ECHO EF 10%. She was started on IV lasix. While admitted she frequently refused to talk and refused medications and treatments. Discharge weight 118 pounds. Last echo 7/15. EF 15%  Her HF course has been marked by noncompliance with meds, f/u and dietary recommendations.   Admitted last night with altered mental status. Now back to baseline. She is a difficult historian but denies dyspnea, orthopnea or PND. Weight noted to be 22 pounds up from her weight in 2/16 (124 lb -> 146 lb). Creatinine up from 1.7 (10/31/14) to 2.1. Renal u/s with medicorenal disease. CT abdomen (noncontrast) with moderate amount of ascites and ansarca. Smmll pericardial effusion    Review of Systems: [y] = yes, [ ]  = no   General: Weight gain Cove.Etienne ]; Weight loss [ ] ; Anorexia [ ] ; Fatigue [ ] ; Fever [ ] ; Chills [ ] ; Weakness [ ]   Cardiac: Chest pain/pressure [ ] ; Resting SOB [ ] ; Exertional SOB [ ] ; Orthopnea [ ] ; Pedal Edema [ ] ; Palpitations [ ] ; Syncope [ ] ; Presyncope [ ] ; Paroxysmal nocturnal dyspnea[ ]   Pulmonary: Cough [ ] ; Wheezing[ ] ; Hemoptysis[ ] ; Sputum [ ] ; Snoring [ ]   GI: Vomiting[ ] ; Dysphagia[ ] ; Melena[ ] ; Hematochezia [ ] ; Heartburn[ ] ; Abdominal pain [ ] ; Constipation [ ] ; Diarrhea [ ] ; BRBPR [ ]   GU: Hematuria[ ] ; Dysuria [ ] ; Nocturia[ ]   Vascular: Pain in legs with walking [ ] ; Pain in feet with lying flat [ ] ; Non-healing sores [ ] ; Stroke [  y]; TIA [ ] ; Slurred speech Cove.Etienne ];  Neuro: Headaches[ ] ; Vertigo[ ] ; Seizures[ ] ; Paresthesias[ ] ;Blurred vision [ ] ; Diplopia [ ] ; Vision changes [ ]   Ortho/Skin: Arthritis [ ] ; Joint pain [ ] ; Muscle pain [ ] ; Joint swelling [ ] ; Back Pain [ ] ; Rash [ ]   Psych: Depression[ y]; Anxiety[ ]   Heme: Bleeding problems [ ] ; Clotting disorders [ ] ; Anemia [ ]   Endocrine: Diabetes [ y]; Thyroid dysfunction[ ]   Home Medications Prior to Admission medications   Medication Sig Start Date End Date Taking? Authorizing Provider  amiodarone (PACERONE) 200 MG tablet Take 1 tablet (200 mg total) by mouth See admin instructions. Takes 200mg  on M,T,W,Th,F only Skips doses on Sat and Sun 10/31/14  Yes Maryann Mikhail, DO  furosemide (LASIX) 40 MG tablet Take 40 mg by mouth 2 (two) times daily.   Yes Historical Provider, MD  levETIRAcetam (KEPPRA) 1000 MG tablet Take 2 tablets (2,000 mg total) by mouth 2 (two) times daily. 10/31/14  Yes Maryann Mikhail, DO  levothyroxine (SYNTHROID, LEVOTHROID) 50 MCG tablet Take 1 tablet (50 mcg total) by mouth daily before breakfast. 10/31/14  Yes Maryann Mikhail, DO  lisinopril (PRINIVIL,ZESTRIL) 5 MG tablet Take 5 mg by mouth daily.   Yes Historical Provider, MD  rivaroxaban (XARELTO) 20 MG TABS tablet Take 20 mg by mouth daily with supper.   Yes  Historical Provider, MD  spironolactone (ALDACTONE) 25 MG tablet Take 25 mg by mouth daily.   Yes Historical Provider, MD  Vitamin D, Ergocalciferol, (DRISDOL) 50000 UNITS CAPS capsule Take 50,000 Units by mouth every 7 (seven) days.   Yes Historical Provider, MD  acetaminophen (TYLENOL) 325 MG tablet Take 650 mg by mouth every 6 (six) hours as needed for pain.    Historical Provider, MD  albuterol (PROVENTIL) (2.5 MG/3ML) 0.083% nebulizer solution Take 2.5 mg by nebulization every 6 (six) hours as needed. For cough or wheeze    Historical Provider, MD  ALPRAZolam Prudy Feeler) 1 MG tablet Take 1 tablet (1 mg total) by mouth at bedtime as needed  for anxiety. 03/24/14   Ripudeep Jenna Luo, MD  insulin lispro (HUMALOG) 100 UNIT/ML injection Inject into the skin 3 (three) times daily before meals. On sliding scale    Historical Provider, MD  iron polysaccharides (NIFEREX) 150 MG capsule Take 1 capsule (150 mg total) by mouth daily. 10/31/14   Maryann Mikhail, DO  Linaclotide (LINZESS) 145 MCG CAPS capsule Take 1 capsule (145 mcg total) by mouth daily. 10/31/14   Maryann Mikhail, DO  oxyCODONE-acetaminophen (PERCOCET) 10-325 MG per tablet Take 1 tablet by mouth every 6 (six) hours as needed for pain. 04/17/14   Jeralyn Bennett, MD  pantoprazole (PROTONIX) 40 MG tablet Take 1 tablet (40 mg total) by mouth daily. 10/31/14   Maryann Mikhail, DO  polyethylene glycol (MIRALAX / GLYCOLAX) packet Take 17 g by mouth daily as needed (constipation).     Historical Provider, MD  QUEtiapine Fumarate (SEROQUEL XR) 150 MG 24 hr tablet Take 1 tablet (150 mg total) by mouth at bedtime. 10/31/14   Maryann Mikhail, DO  Rivaroxaban (XARELTO) 15 MG TABS tablet Take 1 tablet (15 mg total) by mouth daily with supper. 10/31/14   Edsel Petrin, DO    Past Medical History: Past Medical History  Diagnosis Date  . Seizures   . Hypertension   . Stroke     2007 - R sided weakness and expressive aphasia with h/o behavioral problems related to this  . Asthma   . Diabetes mellitus   . Chronic systolic CHF (congestive heart failure)   . Atrial fibrillation     a. On Xarelto after having DVT (prev felt to be poor anticoag cand 2/2 noncompliance).  . History of DVT (deep vein thrombosis)     a. 06/2012: RLE DVT, + protein C deficiency, placed on Xarelto. s/p IVC filter  . Noncompliance     Due to mental status, family has had to coax her to take medicines  . Hypothyroidism   . VT (ventricular tachycardia)     a. h/o ICD ~2007-2008. b. Adm 08/2012 with UTI, had VT/ICD shock x 5 (hypokalemic);  c. 06/2013 s/p ICD Gen change: SJM DC ICD ser #: [id number]  . Protein C deficiency     . NICM (nonischemic cardiomyopathy)     a. s/p AICD ~2008 (St. Jude) - previous care [facility]. No known hx CAD. b. EF 10% by echo 04/2012;  c. 06/2013 s/p ICD Gen change: SJM DC ICD ser #: [id number]  . 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 Panic attacks   . Anxiety     Past Surgical History: Past Surgical History  Procedure Laterality Date  . Cholecystectomy    . Appendectomy    . Pacemaker insertion      [employer]. Judes  . Vena cava filter placement  07/19/2012  Procedure: INSERTION VENA-CAVA FILTER;  Surgeon: Larina Earthly, MD;  Location: [facility] OR;  Service: Vascular;  Laterality: [name];  . Insert / replace / remove pacemaker  2014    ICD. St. Jude; inserted 2008 [facility]); gen change 2014 SK  . Abdominal hysterectomy    . Implantable cardioverter defibrillator generator change [name] 07/03/2013    Procedure: IMPLANTABLE CARDIOVERTER DEFIBRILLATOR GENERATOR CHANGE;  Surgeon: Duke Salvia, MD;  Location: [facility] CATH LAB;  Service: Cardiovascular;  Laterality: [name];    Family History: Family History  Problem Relation Age of Onset  . Hypertension Mother   . Coronary artery disease Neg Hx     Social History: History   Social History  . Marital Status: Single    Spouse Name: [name]  . Number of Children: [name]  . Years of Education: [name]   Occupational History  . Not employed    Social History Main Topics  . Smoking status: Former Games developer  . Smokeless tobacco: Never Used  . Alcohol Use: No  . Drug Use: No  . Sexual Activity: No   Other Topics Concern  . None   Social History Narrative   ** Merged History Encounter **   Pt lives with her brother.        Allergies:  Allergies  Allergen Reactions  . Penicillins Rash  . Sulfa Antibiotics Rash    Objective:    Vital Signs:   Temp:  [97.7 F (36.5 C)-98.2 F (36.8 C)] 98.2 F (36.8 C) (04/20 1004) Pulse Rate:  [29-95] 86 (04/20 1004) Resp:  [20-28] 20 (04/20 1004) BP: (108-139)/(64-103) 128/78 mmHg (04/20  1004) SpO2:  [96 %-100 %] 97 % (04/20 1004) Weight:  [62.2 kg (137 lb 2 oz)-66.4 kg (146 lb 6.2 oz)] 66.4 kg (146 lb 6.2 oz) (04/20 0500)   Filed Weights   01/05/15 2330 01/06/15 0500  Weight: 62.2 kg (137 lb 2 oz) 66.4 kg (146 lb 6.2 oz)    Physical Exam: General:Sitting on side of bed. NAD. + expressive aphasia. No resp difficulty HEENT: normal Neck: supple. JVP jaw Carotids 2+ bilaterally; no bruits. No lymphadenopathy or thryomegaly appreciated. Cor: PMI normal. Regular rate & rhythm. No rubs, gallops. Probable s3L upper chest scar ICD Lungs: clear Abdomen: soft, nontender, + mildly distended. No hepatosplenomegaly. No bruits or masses. Good bowel sounds. Extremities: no cyanosis, clubbing, rash,1+ Edema R>LR sided hemiparesis Neuro: alert interactive. R sided weakness. + expressive aphasia  Telemetry: NSR   Labs: Basic Metabolic Panel:  Recent Labs Lab 01/05/15 1606 01/05/15 1634 01/06/15 0530  NA 137 140 138  K 3.3* 3.4* 3.6  CL 99 98 101  CO2 28  --  22  GLUCOSE 155* 158* 84  BUN 24* 28* 24*  CREATININE 2.33* 2.20* 2.10*  CALCIUM 9.1  --  9.0    Liver Function Tests:  Recent Labs Lab 01/05/15 1606 01/06/15 0530  AST 23 25  ALT 9 9  ALKPHOS 63 56  BILITOT 2.0* 2.3*  PROT 7.8 7.8  ALBUMIN 2.9* 2.8*    Recent Labs Lab 01/06/15 0013  LIPASE 15   No results for input(s): AMMONIA in the last 168 hours.  CBC:  Recent Labs Lab 01/05/15 1606 01/05/15 1634 01/06/15 0530  WBC 6.1  --  6.4  NEUTROABS 4.3  --   --   HGB 8.1* 11.9* 7.7*  HCT 28.1* 35.0* 27.2*  MCV 86.5  --  88.0  PLT 221  --  211    Cardiac Enzymes:  Recent Labs Lab 01/05/15 1606 01/06/15 0013 01/06/15 0530 01/06/15 1140  CKTOTAL  --   --  173  --   TROPONINI 0.34* 0.33* 0.32* 0.33*    BNP: BNP (last 3 results)  Recent Labs  10/30/14 0102  BNP 2965.9*    ProBNP (last 3 results)  Recent Labs  01/10/14 0912 03/21/14 0633 04/15/14 0135  PROBNP 7500.0*  11633.0* 10371.0*     CBG:  Recent Labs Lab 01/05/15 1603 01/06/15 0639 01/06/15 1120  GLUCAP 145* 86 85    Coagulation Studies:  Recent Labs  01/05/15 1606  LABPROT 38.2*  INR 3.86*    Other results: EKG: NSR 87 +TWI   Imaging: Ct Abdomen Pelvis Wo Contrast  01/06/2015   CLINICAL DATA:  Right-sided chest and abdomen pain. Acute kidney injury.  EXAM: CT ABDOMEN AND PELVIS WITHOUT CONTRAST  TECHNIQUE: Multidetector CT imaging of the abdomen and pelvis was performed following the standard protocol without IV contrast.  COMPARISON:  CT scan dated 03/21/2014  FINDINGS: There is chronic massive cardiomegaly. There is a new small to moderate pericardial effusion. There is a small right pleural effusion with slight atelectasis at the right lung base.  There is increased moderate ascites. Chronic hepatomegaly. Gallbladder is been removed. Spleen, pancreas, adrenal glands, and kidneys appear normal. There is chronic distention of what is probably a lymphatic structure to the right of the aorta at the diaphragmatic crus.  IVC filter in place. Calcification throughout the abdominal aorta and common iliac arteries. Uterus is been removed. Left ovary is normal. Right ovary is not identified.  Bladder is normal. No dilated loops of large small bowel. Normal appendix. No acute osseous abnormality. Focal small disc protrusion at L3-4 to the right.  Diffuse anasarca, increased.  IMPRESSION: 1. New small to moderate pericardial effusion with a small right pleural effusion. 2. Increased moderate abdominal ascites and increased diffuse anasarca. 3. No other acute intra-abdominal abnormality.   Electronically Signed   By: Francene Boyers M.D.   On: 01/06/2015 07:41   Ct Head Wo Contrast  01/05/2015   CLINICAL DATA:  Found unresponsive sitting in wheelchair. Previous history of seizures and stroke.  EXAM: CT HEAD WITHOUT CONTRAST  TECHNIQUE: Contiguous axial images were obtained from the base of the skull  through the vertex without intravenous contrast.  COMPARISON:  10/29/2014  FINDINGS: There is old infarction throughout the left middle cerebral artery territory with atrophy, encephalomalacia and adjacent gliosis. There is no evidence of acute infarction, mass lesion, hemorrhage, hydrocephalus or extra-axial collection. No calvarial abnormality. Sinuses are clear. There is atherosclerotic calcification of the major vessels at the base of the brain.  Tiny air bubbles in the soft tissues of the right face and temporal region could either be due to a laceration, air bubbles an pains related to intravenous wind sub, or conceivably due to infectious inflammation.  IMPRESSION: No acute intracranial finding.  Old left MCA territory infarction.  Small air bubbles in the soft tissues of the right temporal region. Usually, this is insignificant and relates to venous air bubbles from intravenous lines. See above discussion   Electronically Signed   By: Paulina Fusi M.D.   On: 01/05/2015 20:51   US Renal  01/06/2015   CLINICAL DATA:  Acute renal insufficiency, abdominal pain, history of hypertension and diabetes  EXAM: RENAL/URINARY TRACT ULTRASOUND COMPLETE  COMPARISON:  Renal ultrasound dated October 31, 2014  FINDINGS: Right Kidney:  Length: 9.8 cm. The renal cortical echotexture is approximately equal to  that of the adjacent liver. No hydronephrosis visualized. There is a lower pole cyst measuring 18 x 14 x 15 mm.  Left Kidney:  Length: 9.5 cm. The renal cortical echotexture is mildly increased. There is no hydronephrosis.  Bladder:  The bladder is nondistended.  IMPRESSION: Increased renal cortical echotexture consistent with medical renal disease. There is no hydronephrosis.   Electronically Signed   By: David  [place] M.D.   On: 01/06/2015 10:31   Dg Chest Portable 1 View  01/05/2015   CLINICAL DATA:  Chest pain, lower abdominal pain  EXAM: PORTABLE CHEST - 1 VIEW  COMPARISON:  10/30/2014  FINDINGS: Significant  cardiomegaly again noted. Dual lead cardiac pacemaker is unchanged in position. Stable small right pleural effusion. Central mild vascular congestion without convincing pulmonary edema. Mild basilar atelectasis. Stable small fluid in right minor fissure. No segmental infiltrate  IMPRESSION: Significant cardiomegaly. Dual lead cardiac pacemaker is unchanged in position. Central mild vascular congestion without convincing pulmonary edema. Small right pleural effusion. Mild basilar atelectasis. No segmental infiltrate.   Electronically Signed   By: Natasha Mead M.D.   On: 01/05/2015 17:26   Dg Femur Min 2 Views Left  01/06/2015   CLINICAL DATA:  Left thigh pain for several days.  No known injury.  EXAM: LEFT FEMUR 2 VIEWS  COMPARISON:  None.  FINDINGS: The left femur appears normal. No evidence of hip or knee effusions. Edema in the subcutaneous soft tissues of the thigh.  IMPRESSION: Soft tissue edema.  Otherwise, normal exam.   Electronically Signed   By: Francene Boyers M.D.   On: 01/06/2015 07:59         Assessment:   1. A/c systolic HF 2. NICM, EF 15% d/t NICM 3. Acute on chronic renal failure 4. Expressive aphasia 5. Noncompliance  Plan/Discussion:    On exam only appears to have mild volume overload but weight is up 20+ pounds and CT suggests anasarca - so likely more fluid there than we see. Will start lasix 80 IB bid. Can add metolazone as needed. Watch renal function closely. Unclear if rising creatinine is cardiorenal or just progression of intrinsic renal disease. May be low output. Would hold ACE and spiro for now. If renal function continues to deteriorate may need short course of milrinone. Will interrogate ICD.    Length of Stay: 1   Arvilla Meres MD 01/06/2015, 1:35 PM  Advanced Heart Failure Team Pager [phone] (M-F; 7a - 4p)  Please contact [facility] for night-coverage after hours (4p -7a ) and weekends on [email or web address]

## 2015-01-06 NOTE — Progress Notes (Signed)
Pt arrived to 4N23 from ED. Pt alert and oriented x 1. Pt connected to telemetry. Vital signs stable.  Will continue to monitor.

## 2015-01-06 NOTE — Progress Notes (Signed)
Called per floor RN at [phone]  regarding routine EKG done this AM with Critical Stemi Results, troponins elevated since admission. Advised to call K.Kirby Triad NP to review EKG while RRT en route. Upon my arrival at 0518 Pt found resting in bed with eyes closed.  Per floor RN she has had confusion, slurred speech, and aphasia tonight. I pointed to Pt chest and asked was she having pain and she nodded head yes and pointed to her legs. Pt rr 17-20, mild expiratory wheeze, Per floor RN Pt has had this since her initial assessment. Po2 95-97 on RA. VSS. EKG repeated per NP order with similar results to Pt baseline EKG. Donnamarie Poag paged again to review repeated EKG for changes. Pt has also been having a few runs of Vtach and the floor RN informed me that she did not receive the oral KCL ordered earlier tonight. RN to update NP on help potassium. Pt left resting in bed, she easily falls back to sleep when left alone.

## 2015-01-06 NOTE — Progress Notes (Signed)
RN paged after performing a routine EKG ordered on admission which resulted "STEMI" on EKG computer. EKG reviewed. Last troponin decreased. EKG does not appear similar to a STEMI. Unable to truly assess for CP as pt is confused and agrees that she has it but points to her leg. She has no other sx of distress, no n/v, no diaphoresis, no SOB or resp distress. VSS. R/p EKG without acute changes and appears similar to one done on admission. Doubt ACS. Pt has received ASA 325 since admission and is on 81mg  daily. Continue to cycle troponins although, as stated above, they are trending down. Craige Cotta Triad

## 2015-01-06 NOTE — Evaluation (Signed)
Clinical/Bedside Swallow Evaluation Patient Details  Name: Elizabeth Love MRN: [id number] Date of Birth: 08/21/1956  Today's Date: 01/06/2015 Time: SLP Start Time (ACUTE ONLY): 0855 SLP Stop Time (ACUTE ONLY): 0906 SLP Time Calculation (min) (ACUTE ONLY): 11 min  Past Medical History:  Past Medical History  Diagnosis Date  . Seizures   . Hypertension   . Stroke     2007 - R sided weakness and expressive aphasia with h/o behavioral problems related to this  . Asthma   . Diabetes mellitus   . Chronic systolic CHF (congestive heart failure)   . Atrial fibrillation     a. On Xarelto after having DVT (prev felt to be poor anticoag cand 2/2 noncompliance).  . History of DVT (deep vein thrombosis)     a. 06/2012: RLE DVT, + protein C deficiency, placed on Xarelto. s/p IVC filter  . Noncompliance     Due to mental status, family has had to coax her to take medicines  . Hypothyroidism   . VT (ventricular tachycardia)     a. h/o ICD ~2007-2008. b. Adm 08/2012 with UTI, had VT/ICD shock x 5 (hypokalemic);  c. 06/2013 s/p ICD Gen change: SJM DC ICD ser #: [id number]  . Protein C deficiency   . NICM (nonischemic cardiomyopathy)     a. s/p AICD ~2008 (St. Jude) - previous care [facility]. No known hx CAD. b. EF 10% by echo 04/2012;  c. 06/2013 s/p ICD Gen change: SJM DC ICD ser #: [id number]  . 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 Panic attacks   . Anxiety    Past Surgical History:  Past Surgical History  Procedure Laterality Date  . Cholecystectomy    . Appendectomy    . Pacemaker insertion      [employer] Judes  . Vena cava filter placement  07/19/2012    Procedure: INSERTION VENA-CAVA FILTER;  Surgeon: Larina Earthly, MD;  Location: [facility] OR;  Service: Vascular;  Laterality: [name];  . Insert / replace / remove pacemaker  2014    ICD. St. Jude; inserted 2008 [facility]); gen change 2014 SK  . Abdominal hysterectomy    . Implantable cardioverter defibrillator generator change [name] 07/03/2013     Procedure: IMPLANTABLE CARDIOVERTER DEFIBRILLATOR GENERATOR CHANGE;  Surgeon: Duke Salvia, MD;  Location: [facility] CATH LAB;  Service: Cardiovascular;  Laterality: [name];   HPI:  Elizabeth Love is a 59 y.o. female with past medical history hypertension, diabetes mellitus, GERD, hypothyroidism, panic attack, anxiety, AICD, seizure, stroke, systolic congestive heart failure, atrial fibrillation, protein C deficiency, chronic kidney disease-stage III, asthma, right lower extremity DVT, who presents with altered mental status, left thigh pain and abdominal pain. CTNo acute intracranial finding. Old left MCA territory infarction. Chest x-ray showed mild vascular congestion, and small amount of right pleural effusion. Previous bedside swallow assessments 11/2012, 11/2012, 10/30/14 recommended Dys/regular or thin. No prior MBS.   Assessment / Plan / Recommendation Clinical Impression  Pt anxious, crying in pain prior to SLP arrival, RN aware. Pt follows commands with cueing and cognitive impairments limit her ability. Oral manipulation and transit with thin and solid textures WFL's. No indications of penetration or aspiration during consecutive straw sips thin. Recommend regular texture/thin liquids, straws allowed, pills with thin, intermittent supervision due to questionable impulsivity. No further ST needed.    Aspiration Risk   (mild-moderate)    Diet Recommendation Regular;Thin liquid   Liquid Administration via: Cup;Straw Medication Administration: Whole meds  with liquid Supervision: Patient able to self feed;Intermittent supervision to cue for compensatory strategies Compensations: Slow rate;Small sips/bites;Check for pocketing Postural Changes and/or Swallow Maneuvers: Seated upright 90 degrees    Other  Recommendations Oral Care Recommendations: Oral care BID   Follow Up Recommendations  None    Frequency and Duration        Pertinent Vitals/Pain none         Swallow Study           Oral/Motor/Sensory Function Overall Oral Motor/Sensory Function:  (decreased precision, cognitive impact on ability to perform)   Ice Chips Ice chips: Not tested   Thin Liquid Thin Liquid: Within functional limits Presentation: Cup;Straw    Nectar Thick Nectar Thick Liquid: Not tested   Honey Thick Honey Thick Liquid: Not tested   Puree Puree: Not tested (declined applesauce)   Solid   GO    Solid: Within functional limits       Royce Macadamia 01/06/2015,9:24 AM  Breck Coons Lonell Face.Ed [employer] [phone]

## 2015-01-06 NOTE — Progress Notes (Signed)
PATIENT DETAILS Name: Elizabeth Love Age: 59 y.o. Sex: female Date of Birth: [birth year] Admit Date: 01/05/2015 Admitting Physician Lorretta Harp, MD PCP:No primary care provider on file.  Subjective: Has baseline expressive aphasia-complaining of pain in the left groin area. But seems to be much more alert compared to yesterday.  Assessment/Plan: Principal Problem:   Acute encephalopathy: Etiology likely secondary to postictal state. CT head negative for acute abnormalities. Spoke with patient's son on 4/20-has expressive aphasia at baseline, suspect current mental status is at baseline.  Active Problems:   Suspected breakthrough seizures: Secondary to noncompliance. Per patient's son-patient non compliant seizure medications. Resume Keppra.    Acute renal failure: Etiology uncertain, but could be secondary to cardiorenal syndrome or from previous renal azotemia due to ACE inhibitor/diuretic/Aldactone. Stop Aldactone and lisinopril for now, diuretics per cardiology    Acute systolic heart failure:2-D echo on 03/21/14 showed EF of 15%, significant weight gain compared to her usual baseline. Continue IV Lasix. Cardiology consulted.    Paroxysmal Atrial fibrillation: Continue amiodarone, continue Xarelto.CHA2DS2-VASc Score is 6    History of DVT: Continue anticoagulation with a Xarelto-question compliance. Per chart review, has a history of protein C deficiency. Has IVC filter in place.    Left upper thigh pain: X-ray negative, CT of the abdomen without acute abnormalities. We will check Doppler to make sure no DVT-as suspect noncompliant to anticoagulation    Elevated troponin levels: Suspect secondary to worsening renal function. Doubt further workup needed.    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  Disposition: Remain inpatient  Antimicrobial agents  See below  Anti-infectives    None      DVT Prophylaxis: Xarelto  Code Status: Full code   Family Communication Son Audry Pili the phone  Procedures: None  CONSULTS:  cardiology  Time spent 40 minutes-which includes 50% of the time with face-to-face with patient/ family and coordinating care related to the above assessment and plan.  MEDICATIONS: Scheduled Meds: . amiodarone  200 mg Oral Once per day on Mon Tue Wed Thu Fri  . antiseptic oral rinse  7 mL Mouth Rinse q12n4p  . aspirin EC  81 mg Oral Daily  . chlorhexidine  15 mL Mouth Rinse BID  . furosemide  80 mg Intravenous BID  . insulin aspart  0-9 Units Subcutaneous TID WC  . iron polysaccharides  150 mg Oral Daily  . levETIRAcetam  1,000 mg Intravenous Q12H  . levothyroxine  50 mcg Oral QAC breakfast  . Linaclotide  145 mcg Oral Daily  . pantoprazole  40 mg Oral Daily  . potassium chloride  40 mEq Oral Once  . QUEtiapine Fumarate  150 mg Oral QHS  . Rivaroxaban  15 mg Oral Q supper  . sodium chloride  3 mL Intravenous Q12H   Continuous Infusions:  PRN Meds:.albuterol, ALPRAZolam, hydrALAZINE, morphine injection, ondansetron **OR** ondansetron (ZOFRAN) IV, [DISCONTINUED] oxyCODONE-acetaminophen **AND** oxyCODONE, polyethylene glycol    PHYSICAL EXAM: Vital signs in last 24 hours: Filed Vitals:   01/06/15 0537 01/06/15 1004 01/06/15 1403 01/06/15 1649  BP: 108/64 128/78 112/83 109/72  Pulse: 85 86 96 88  Temp:  98.2  F (36.8 C) 98.3 F (36.8 C) 98 F (36.7 C)  TempSrc:  Oral Oral Axillary  Resp: Weight:      SpO2: 97% 97% 93% 95%    Weight change:  Filed Weights   01/05/15 2330 01/06/15 0500  Weight: 62.2 kg (137 lb 2 oz) 66.4 kg (146 lb 6.2 oz)   Body mass index is 28.74  kg/(m^2).   Gen Exam: Awake, not in any distress. Expressive aphasia Neck: Supple, No JVD.   Chest: B/L Clear.   CVS: S1 S2 Regular Abdomen: soft, BS +, non tender, non distended.  Extremities: 1-2+ edema, lower extremities warm to touch. Neurologic: left sided hemiplegia Skin: No Rash.   Wounds: [name].   Intake/Output from previous day:  Intake/Output Summary (Last 24 hours) at 01/06/15 1653 Last data filed at 01/06/15 1231  Gross per 24 hour  Intake      0 ml  Output      0 ml  Net      0 ml     LAB RESULTS: CBC  Recent Labs Lab 01/05/15 1606 01/05/15 1634 01/06/15 0530  WBC 6.1  --  6.4  HGB 8.1* 11.9* 7.7*  HCT 28.1* 35.0* 27.2*  PLT 221  --  211  MCV 86.5  --  88.0  MCH 24.9*  --  24.9*  MCHC 28.8*  --  28.3*  RDW 22.6*  --  22.5*  LYMPHSABS 1.0  --   --   MONOABS 0.7  --   --   EOSABS 0.1  --   --   BASOSABS 0.0  --   --     Chemistries   Recent Labs Lab 01/05/15 1606 01/05/15 1634 01/06/15 0530  NA 137 140 138  K 3.3* 3.4* 3.6  CL 99 98 101  CO2 28  --  22  GLUCOSE 155* 158* 84  BUN 24* 28* 24*  CREATININE 2.33* 2.20* 2.10*  CALCIUM 9.1  --  9.0    CBG:  Recent Labs Lab 01/05/15 1603 01/06/15 0639 01/06/15 1120  GLUCAP 145* 86 85    GFR Estimated Creatinine Clearance: 25.3 mL/min (by C-G formula based on Cr of 2.1).  Coagulation profile  Recent Labs Lab 01/05/15 1606  INR 3.86*    Cardiac Enzymes  Recent Labs Lab 01/06/15 0013 01/06/15 0530 01/06/15 1140  TROPONINI 0.33* 0.32* 0.33*    Invalid input(s): POCBNP No results for input(s): DDIMER in the last 72 hours. No results for input(s): HGBA1C in the last 72 hours. No results for input(s): CHOL, HDL, LDLCALC, TRIG, CHOLHDL, LDLDIRECT in the last 72 hours.  Recent Labs  01/06/15 0530  TSH 6.406*   No results for input(s): VITAMINB12, FOLATE, FERRITIN, TIBC, IRON, RETICCTPCT in the last 72 hours.  Recent Labs  01/06/15 0013  LIPASE 15    Urine  Studies No results for input(s): UHGB, CRYS in the last 72 hours.  Invalid input(s): UACOL, UAPR, USPG, UPH, UTP, UGL, UKET, UBIL, UNIT, UROB, ULEU, UEPI, UWBC, URBC, UBAC, CAST, UCOM, BILUA  MICROBIOLOGY: No results found for this or any previous visit (from the past 240 hour(s)).  RADIOLOGY STUDIES/RESULTS: Ct Abdomen Pelvis Wo Contrast  01/06/2015   CLINICAL DATA:  Right-sided chest and abdomen pain. Acute kidney injury.  EXAM: CT ABDOMEN AND PELVIS WITHOUT CONTRAST  TECHNIQUE: Multidetector CT imaging of the abdomen and pelvis was performed following the standard protocol without IV contrast.  COMPARISON:  CT scan dated 03/21/2014  FINDINGS:  There is chronic massive cardiomegaly. There is a new small to moderate pericardial effusion. There is a small right pleural effusion with slight atelectasis at the right lung base.  There is increased moderate ascites. Chronic hepatomegaly. Gallbladder is been removed. Spleen, pancreas, adrenal glands, and kidneys appear normal. There is chronic distention of what is probably a lymphatic structure to the right of the aorta at the diaphragmatic crus.  IVC filter in place. Calcification throughout the abdominal aorta and common iliac arteries. Uterus is been removed. Left ovary is normal. Right ovary is not identified.  Bladder is normal. No dilated loops of large small bowel. Normal appendix. No acute osseous abnormality. Focal small disc protrusion at L3-4 to the right.  Diffuse anasarca, increased.  IMPRESSION: 1. New small to moderate pericardial effusion with a small right pleural effusion. 2. Increased moderate abdominal ascites and increased diffuse anasarca. 3. No other acute intra-abdominal abnormality.   Electronically Signed   By: Francene Boyers M.D.   On: 01/06/2015 07:41   Ct Head Wo Contrast  01/05/2015   CLINICAL DATA:  Found unresponsive sitting in wheelchair. Previous history of seizures and stroke.  EXAM: CT HEAD WITHOUT CONTRAST  TECHNIQUE:  Contiguous axial images were obtained from the base of the skull through the vertex without intravenous contrast.  COMPARISON:  10/29/2014  FINDINGS: There is old infarction throughout the left middle cerebral artery territory with atrophy, encephalomalacia and adjacent gliosis. There is no evidence of acute infarction, mass lesion, hemorrhage, hydrocephalus or extra-axial collection. No calvarial abnormality. Sinuses are clear. There is atherosclerotic calcification of the major vessels at the base of the brain.  Tiny air bubbles in the soft tissues of the right face and temporal region could either be due to a laceration, air bubbles an pains related to intravenous wind sub, or conceivably due to infectious inflammation.  IMPRESSION: No acute intracranial finding.  Old left MCA territory infarction.  Small air bubbles in the soft tissues of the right temporal region. Usually, this is insignificant and relates to venous air bubbles from intravenous lines. See above discussion   Electronically Signed   By: Paulina Fusi M.D.   On: 01/05/2015 20:51   US Renal  01/06/2015   CLINICAL DATA:  Acute renal insufficiency, abdominal pain, history of hypertension and diabetes  EXAM: RENAL/URINARY TRACT ULTRASOUND COMPLETE  COMPARISON:  Renal ultrasound dated October 31, 2014  FINDINGS: Right Kidney:  Length: 9.8 cm. The renal cortical echotexture is approximately equal to that of the adjacent liver. No hydronephrosis visualized. There is a lower pole cyst measuring 18 x 14 x 15 mm.  Left Kidney:  Length: 9.5 cm. The renal cortical echotexture is mildly increased. There is no hydronephrosis.  Bladder:  The bladder is nondistended.  IMPRESSION: Increased renal cortical echotexture consistent with medical renal disease. There is no hydronephrosis.   Electronically Signed   By: David  [place] M.D.   On: 01/06/2015 10:31   Dg Chest Portable 1 View  01/05/2015   CLINICAL DATA:  Chest pain, lower abdominal pain  EXAM: PORTABLE  CHEST - 1 VIEW  COMPARISON:  10/30/2014  FINDINGS: Significant cardiomegaly again noted. Dual lead cardiac pacemaker is unchanged in position. Stable small right pleural effusion. Central mild vascular congestion without convincing pulmonary edema. Mild basilar atelectasis. Stable small fluid in right minor fissure. No segmental infiltrate  IMPRESSION: Significant cardiomegaly. Dual lead cardiac pacemaker is unchanged in position. Central mild vascular congestion without convincing pulmonary edema. Small right pleural effusion. Mild basilar  atelectasis. No segmental infiltrate.   Electronically Signed   By: Natasha Mead M.D.   On: 01/05/2015 17:26   Dg Femur Min 2 Views Left  01/06/2015   CLINICAL DATA:  Left thigh pain for several days.  No known injury.  EXAM: LEFT FEMUR 2 VIEWS  COMPARISON:  None.  FINDINGS: The left femur appears normal. No evidence of hip or knee effusions. Edema in the subcutaneous soft tissues of the thigh.  IMPRESSION: Soft tissue edema.  Otherwise, normal exam.   Electronically Signed   By: Francene Boyers M.D.   On: 01/06/2015 07:59    Jeoffrey Massed, MD  [facility] Pager:[phone]  If 7PM-7AM, please contact night-coverage [email or web address] Password [email or web address] 01/06/2015, 4:53 PM   LOS: 1 day

## 2015-01-07 DIAGNOSIS — N179 Acute kidney failure, unspecified: Secondary | ICD-10-CM | POA: Insufficient documentation

## 2015-01-07 DIAGNOSIS — M79652 Pain in left thigh: Secondary | ICD-10-CM

## 2015-01-07 DIAGNOSIS — I5021 Acute systolic (congestive) heart failure: Secondary | ICD-10-CM | POA: Insufficient documentation

## 2015-01-07 DIAGNOSIS — I429 Cardiomyopathy, unspecified: Secondary | ICD-10-CM

## 2015-01-07 LAB — BASIC METABOLIC PANEL
Anion gap: 20 — ABNORMAL HIGH (ref 5–15)
BUN: 33 mg/dL — ABNORMAL HIGH (ref 6–23)
CO2: 19 mmol/L (ref 19–32)
CREATININE: 2.46 mg/dL — AB (ref 0.50–1.10)
Calcium: 9.3 mg/dL (ref 8.4–10.5)
Chloride: 100 mmol/L (ref 96–112)
GFR calc non Af Amer: 21 mL/min — ABNORMAL LOW (ref >90)
GFR, EST AFRICAN AMERICAN: 24 mL/min — AB (ref >90)
Glucose, Bld: 87 mg/dL (ref 70–99)
POTASSIUM: 4.6 mmol/L (ref 3.5–5.1)
Sodium: 139 mmol/L (ref 135–145)

## 2015-01-07 LAB — GLUCOSE, CAPILLARY
GLUCOSE-CAPILLARY: 125 mg/dL — AB (ref 70–99)
Glucose-Capillary: 107 mg/dL — ABNORMAL HIGH (ref 70–99)
Glucose-Capillary: 98 mg/dL (ref 70–99)
Glucose-Capillary: 135 mg/dL — ABNORMAL HIGH (ref 70–99)

## 2015-01-07 LAB — CBC
HCT: 29.4 % — ABNORMAL LOW (ref 36.0–46.0)
Hemoglobin: 8.4 g/dL — ABNORMAL LOW (ref 12.0–15.0)
MCH: 25 pg — AB (ref 26.0–34.0)
MCHC: 28.6 g/dL — ABNORMAL LOW (ref 30.0–36.0)
MCV: 87.5 fL (ref 78.0–100.0)
Platelets: 223 10*3/uL (ref 150–400)
RBC: 3.36 MIL/uL — ABNORMAL LOW (ref 3.87–5.11)
RDW: 22.2 % — AB (ref 11.5–15.5)
WBC: 7.3 10*3/uL (ref 4.0–10.5)

## 2015-01-07 LAB — CREATININE, URINE, RANDOM: CREATININE, URINE: 174.36 mg/dL

## 2015-01-07 MED ORDER — LEVETIRACETAM 500 MG PO TABS
1000.0000 mg | ORAL_TABLET | Freq: Two times a day (BID) | ORAL | Status: DC
  Administered 2015-01-07 – 2015-01-08 (×3): 1000 mg via ORAL
  Filled 2015-01-07 (×3): qty 2

## 2015-01-07 MED ORDER — OXYCODONE HCL ER 15 MG PO T12A
15.0000 mg | EXTENDED_RELEASE_TABLET | Freq: Two times a day (BID) | ORAL | Status: DC
  Administered 2015-01-07: 15 mg via ORAL
  Filled 2015-01-07 (×2): qty 1

## 2015-01-07 MED ORDER — HYDRALAZINE HCL 25 MG PO TABS
12.5000 mg | ORAL_TABLET | Freq: Three times a day (TID) | ORAL | Status: DC
Start: 2015-01-07 — End: 2015-01-08
  Administered 2015-01-07 – 2015-01-08 (×3): 12.5 mg via ORAL
  Filled 2015-01-07 (×3): qty 1

## 2015-01-07 MED ORDER — ISOSORBIDE MONONITRATE ER 30 MG PO TB24
15.0000 mg | ORAL_TABLET | Freq: Every day | ORAL | Status: DC
  Administered 2015-01-07 – 2015-01-08 (×2): 15 mg via ORAL
  Filled 2015-01-07 (×2): qty 1

## 2015-01-07 NOTE — Progress Notes (Signed)
Advanced Heart Failure Rounding Note   Subjective:    Yesterday she was diuresed with IV lasix. Weight down 9 pounds. I/O not accurate.  BMET pending  Complaining of headache.   Objective:   Weight Range:  Vital Signs:   Temp:  [97.7 F (36.5 C)-98.6 F (37 C)] 97.7 F (36.5 C) (04/21 0516) Pulse Rate:  [86-99] 96 (04/21 0516) Resp:  [16-20] 18 (04/21 0516) BP: (109-143)/(72-90) 119/90 mmHg (04/21 0516) SpO2:  [93 %-100 %] 100 % (04/21 0516) Weight:  [135 lb 5.8 oz (61.4 kg)] 135 lb 5.8 oz (61.4 kg) (04/21 0500) Last BM Date:  (PTA, unknown)  Weight change: Filed Weights   01/05/15 2330 01/06/15 0500 01/07/15 0500  Weight: 137 lb 2 oz (62.2 kg) 146 lb 6.2 oz (66.4 kg) 135 lb 5.8 oz (61.4 kg)    Intake/Output:   Intake/Output Summary (Last 24 hours) at 01/07/15 0940 Last data filed at 01/06/15 1231  Gross per 24 hour  Intake      0 ml  Output      0 ml  Net      0 ml     Physical Exam: General:Sitting on side of bed. NAD. + expressive aphasia. No resp difficulty HEENT: normal Neck: supple. JVP 9-10 Carotids 2+ bilaterally; no bruits. No lymphadenopathy or thryomegaly appreciated. Cor: PMI normal. Regular rate & rhythm. No rubs, gallops. Probable s3L upper chest scar ICD Lungs: clear Abdomen: soft, nontender, + mildly distended. No hepatosplenomegaly. No bruits or masses. Good bowel sounds. Extremities: no cyanosis, clubbing, rash,trace-1+ Edema R>LR sided hemiparesis Neuro: alert interactive. R sided weakness. + expressive aphasia  Labs: Basic Metabolic Panel:  Recent Labs Lab 01/05/15 1606 01/05/15 1634 01/06/15 0530  NA 137 140 138  K 3.3* 3.4* 3.6  CL 99 98 101  CO2 28  --  22  GLUCOSE 155* 158* 84  BUN 24* 28* 24*  CREATININE 2.33* 2.20* 2.10*  CALCIUM 9.1  --  9.0    Liver Function Tests:  Recent Labs Lab 01/05/15 1606 01/06/15 0530  AST 23 25  ALT 9 9  ALKPHOS 63 56  BILITOT 2.0* 2.3*  PROT 7.8 7.8  ALBUMIN 2.9* 2.8*    Recent  Labs Lab 01/06/15 0013  LIPASE 15   No results for input(s): AMMONIA in the last 168 hours.  CBC:  Recent Labs Lab 01/05/15 1606 01/05/15 1634 01/06/15 0530  WBC 6.1  --  6.4  NEUTROABS 4.3  --   --   HGB 8.1* 11.9* 7.7*  HCT 28.1* 35.0* 27.2*  MCV 86.5  --  88.0  PLT 221  --  211    Cardiac Enzymes:  Recent Labs Lab 01/05/15 1606 01/06/15 0013 01/06/15 0530 01/06/15 1140  CKTOTAL  --   --  173  --   TROPONINI 0.34* 0.33* 0.32* 0.33*    BNP: BNP (last 3 results)  Recent Labs  10/30/14 0102  BNP 2965.9*    ProBNP (last 3 results)  Recent Labs  01/10/14 0912 03/21/14 0633 04/15/14 0135  PROBNP 7500.0* 11633.0* 10371.0*      Other results:  Imaging: Ct Abdomen Pelvis Wo Contrast  01/06/2015   CLINICAL DATA:  Right-sided chest and abdomen pain. Acute kidney injury.  EXAM: CT ABDOMEN AND PELVIS WITHOUT CONTRAST  TECHNIQUE: Multidetector CT imaging of the abdomen and pelvis was performed following the standard protocol without IV contrast.  COMPARISON:  CT scan dated 03/21/2014  FINDINGS: There is chronic massive cardiomegaly. There is a new  small to moderate pericardial effusion. There is a small right pleural effusion with slight atelectasis at the right lung base.  There is increased moderate ascites. Chronic hepatomegaly. Gallbladder is been removed. Spleen, pancreas, adrenal glands, and kidneys appear normal. There is chronic distention of what is probably a lymphatic structure to the right of the aorta at the diaphragmatic crus.  IVC filter in place. Calcification throughout the abdominal aorta and common iliac arteries. Uterus is been removed. Left ovary is normal. Right ovary is not identified.  Bladder is normal. No dilated loops of large small bowel. Normal appendix. No acute osseous abnormality. Focal small disc protrusion at L3-4 to the right.  Diffuse anasarca, increased.  IMPRESSION: 1. New small to moderate pericardial effusion with a small right  pleural effusion. 2. Increased moderate abdominal ascites and increased diffuse anasarca. 3. No other acute intra-abdominal abnormality.   Electronically Signed   By: Francene Boyers M.D.   On: 01/06/2015 07:41   Ct Head Wo Contrast  01/05/2015   CLINICAL DATA:  Found unresponsive sitting in wheelchair. Previous history of seizures and stroke.  EXAM: CT HEAD WITHOUT CONTRAST  TECHNIQUE: Contiguous axial images were obtained from the base of the skull through the vertex without intravenous contrast.  COMPARISON:  10/29/2014  FINDINGS: There is old infarction throughout the left middle cerebral artery territory with atrophy, encephalomalacia and adjacent gliosis. There is no evidence of acute infarction, mass lesion, hemorrhage, hydrocephalus or extra-axial collection. No calvarial abnormality. Sinuses are clear. There is atherosclerotic calcification of the major vessels at the base of the brain.  Tiny air bubbles in the soft tissues of the right face and temporal region could either be due to a laceration, air bubbles an pains related to intravenous wind sub, or conceivably due to infectious inflammation.  IMPRESSION: No acute intracranial finding.  Old left MCA territory infarction.  Small air bubbles in the soft tissues of the right temporal region. Usually, this is insignificant and relates to venous air bubbles from intravenous lines. See above discussion   Electronically Signed   By: Paulina Fusi M.D.   On: 01/05/2015 20:51   US Renal  01/06/2015   CLINICAL DATA:  Acute renal insufficiency, abdominal pain, history of hypertension and diabetes  EXAM: RENAL/URINARY TRACT ULTRASOUND COMPLETE  COMPARISON:  Renal ultrasound dated October 31, 2014  FINDINGS: Right Kidney:  Length: 9.8 cm. The renal cortical echotexture is approximately equal to that of the adjacent liver. No hydronephrosis visualized. There is a lower pole cyst measuring 18 x 14 x 15 mm.  Left Kidney:  Length: 9.5 cm. The renal cortical  echotexture is mildly increased. There is no hydronephrosis.  Bladder:  The bladder is nondistended.  IMPRESSION: Increased renal cortical echotexture consistent with medical renal disease. There is no hydronephrosis.   Electronically Signed   By: David  [place] M.D.   On: 01/06/2015 10:31   Dg Chest Portable 1 View  01/05/2015   CLINICAL DATA:  Chest pain, lower abdominal pain  EXAM: PORTABLE CHEST - 1 VIEW  COMPARISON:  10/30/2014  FINDINGS: Significant cardiomegaly again noted. Dual lead cardiac pacemaker is unchanged in position. Stable small right pleural effusion. Central mild vascular congestion without convincing pulmonary edema. Mild basilar atelectasis. Stable small fluid in right minor fissure. No segmental infiltrate  IMPRESSION: Significant cardiomegaly. Dual lead cardiac pacemaker is unchanged in position. Central mild vascular congestion without convincing pulmonary edema. Small right pleural effusion. Mild basilar atelectasis. No segmental infiltrate.   Electronically Signed  By: Natasha Mead M.D.   On: 01/05/2015 17:26   Dg Femur Min 2 Views Left  01/06/2015   CLINICAL DATA:  Left thigh pain for several days.  No known injury.  EXAM: LEFT FEMUR 2 VIEWS  COMPARISON:  None.  FINDINGS: The left femur appears normal. No evidence of hip or knee effusions. Edema in the subcutaneous soft tissues of the thigh.  IMPRESSION: Soft tissue edema.  Otherwise, normal exam.   Electronically Signed   By: Francene Boyers M.D.   On: 01/06/2015 07:59      Medications:     Scheduled Medications: . amiodarone  200 mg Oral Once per day on Mon Tue Wed Thu Fri  . antiseptic oral rinse  7 mL Mouth Rinse q12n4p  . aspirin EC  81 mg Oral Daily  . chlorhexidine  15 mL Mouth Rinse BID  . furosemide  80 mg Intravenous BID  . insulin aspart  0-9 Units Subcutaneous TID WC  . levETIRAcetam  1,000 mg Oral BID  . levothyroxine  50 mcg Oral QAC breakfast  . pantoprazole  40 mg Oral Daily  . potassium chloride  40  mEq Oral Once  . QUEtiapine Fumarate  150 mg Oral QHS  . Rivaroxaban  15 mg Oral Q supper  . sodium chloride  3 mL Intravenous Q12H     Infusions:     PRN Medications:  albuterol, ALPRAZolam, hydrALAZINE, morphine injection, ondansetron **OR** ondansetron (ZOFRAN) IV, [DISCONTINUED] oxyCODONE-acetaminophen **AND** oxyCODONE, polyethylene glycol   Assessment:   1. A/C systolic HF 2. NICM, EF 15% d/t NICM 3. Acute on chronic renal failure 4. Expressive aphasia 5. Noncompliance  Plan/Discussion:    Volume status improving. Continue IV lasix one more day and likely transition back to lasix. Check BMEt now. No BB.  No Ace with CKD. Add hydralazine 12.5 mg tid and imdur 15 mg daily.   Will need HH.   Length of Stay: 2   CLEGG,AMY NP-C  01/07/2015, 9:40 AM  Advanced Heart Failure Team Pager [phone] (M-F; 7a - 4p)  Please contact [facility] for night-coverage after hours (4p -7a ) and weekends on [email or web address]  Patient seen and examined with Tonye Becket, NP. We discussed all aspects of the encounter. I agree with the assessment and plan as stated above.   She id diuresing well. Will continue. Await BMET.   ICD interrogation shows fluid level up. Brief runs. NSVT on 4/15. Brief flurries of ATach/AFL. Now in NSR.   Inna Tisdell,MD 11:00 AM

## 2015-01-07 NOTE — Evaluation (Signed)
Physical Therapy Evaluation Patient Details Name: Elizabeth Love MRN: [id number] DOB: 07-14-1956 Today's Date: 01/07/2015   History of Present Illness  59 year old with a h/o chronic systolic heart failure, hypothyroidism, seizure disorder, HTN, DM2. 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 Presents for possible seizure and AMS.  Clinical Impression  Patient demonstrates deficits in functional mobility as indicated below. Will benefit from continued skilled PT to address deficits and maximize function. Will need to determine true baseline.    Follow Up Recommendations SNF;Supervision/Assistance - 24 hour (will need clarification on baseline)    Equipment Recommendations  None recommended by PT    Recommendations for Other Services       Precautions / Restrictions Precautions Precautions: Fall      Mobility  Bed Mobility Overal bed mobility: Needs Assistance Bed Mobility: Supine to Sit     Supine to sit: Max assist     General bed mobility comments: to EOB using chuck pad  Transfers Overall transfer level: Needs assistance Equipment used:  (gait belt and chuck pad face to face) Transfers: Sit to/from Stand;Stand Pivot Transfers Sit to Stand: Max assist Stand pivot transfers: Max assist       General transfer comment: Patient able to take some weight through LEs  Ambulation/Gait                Stairs            Wheelchair Mobility    Modified Rankin (Stroke Patients Only)       Balance Overall balance assessment: Needs assistance Sitting-balance support: Feet supported Sitting balance-Leahy Scale: Fair       Standing balance-Leahy Scale: Zero                               Pertinent Vitals/Pain Pain Assessment: Faces Faces Pain Scale: Hurts a little bit Pain Location: unknown Pain Descriptors / Indicators: Moaning Pain  Intervention(s): Repositioned    Home Living Family/patient expects to be discharged to:: Private residence Living Arrangements: Children;Other relatives Available Help at Discharge: Family;Available 24 hours/day Type of Home: House Home Access: Level entry     Home Layout: Two level;Bed/bath upstairs Home Equipment: Bedside commode;Cane - quad;Wheelchair - manual;Shower seat Additional Comments: Information and history obtained from previous admission history    Prior Function Level of Independence: Needs assistance         Comments: per chart review, patient spends most of her time in w/c     Hand Dominance   Dominant Hand: Left    Extremity/Trunk Assessment   Upper Extremity Assessment: Difficult to assess due to impaired cognition           Lower Extremity Assessment: Difficult to assess due to impaired cognition (residual Rsided deficits from previous CVA)      Cervical / Trunk Assessment: Kyphotic  Communication   Communication: Expressive difficulties;Receptive difficulties  Cognition Arousal/Alertness: Awake/alert Behavior During Therapy: Anxious;Restless Overall Cognitive Status: History of cognitive impairments - at baseline                      General Comments      Exercises        Assessment/Plan    PT Assessment Patient needs continued PT services  PT Diagnosis Difficulty walking;Acute pain;Altered mental status   PT Problem List Decreased strength;Decreased range of motion;Decreased activity  tolerance;Decreased balance;Decreased mobility;Decreased coordination;Decreased cognition;Decreased safety awareness;Cardiopulmonary status limiting activity;Pain  PT Treatment Interventions DME instruction;Gait training;Stair training;Functional mobility training;Therapeutic activities;Therapeutic exercise;Balance training;Patient/family education   PT Goals (Current goals can be found in the Care Plan section) Acute Rehab PT Goals Patient  Stated Goal: none stated PT Goal Formulation: Patient unable to participate in goal setting Time For Goal Achievement: 01/21/15 Potential to Achieve Goals: Fair    Frequency Min 2X/week   Barriers to discharge        Co-evaluation               End of Session Equipment Utilized During Treatment: Gait belt Activity Tolerance: No increased pain Patient left: in chair;with call bell/phone within reach;with chair alarm set Nurse Communication: Mobility status         Time: 9417-4081 PT Time Calculation (min) (ACUTE ONLY): 22 min   Charges:   PT Evaluation $Initial PT Evaluation Tier I: 1 Procedure     PT G CodesFabio Asa Feb 04, 2015, 11:47 AM Charlotte Crumb, PT DPT  [phone]

## 2015-01-07 NOTE — Discharge Instructions (Signed)

## 2015-01-07 NOTE — Evaluation (Signed)
Occupational Therapy Evaluation Patient Details Name: Elizabeth Love MRN: [id number] DOB: 08/16/1956 Today's Date: 01/07/2015    History of Present Illness 59 year old with a h/o chronic systolic heart failure, hypothyroidism, seizure disorder, HTN, DM2. 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 Presents for possible seizure and AMS.   Clinical Impression   Pt admitted with the above diagnoses and presents with below problem list. Pt will benefit from continued acute OT to address the below listed deficits and maximize independence with basic ADLs prior to d/c. Per son who is primary cg, PTA pt was mod A for bathing/dressing and min A for SPT to [facility].  Currently pt is max A for LB ADLs and SPT. Discussed pt's level of assist and SNf recommendation with son. Son plans to d/c home. If pt d/c home recommend hospital bed and HHOT. OT to continue to follow acutely.     Follow Up Recommendations  SNF;Supervision/Assistance - 24 hour; (if d/c home then [facility])    Equipment Recommendations  Other (comment) (if d/c home recommend hospital bed)    Recommendations for Other Services       Precautions / Restrictions Precautions Precautions: Fall      Mobility Bed Mobility Overal bed mobility: Needs Assistance Bed Mobility: Sit to Supine       Sit to supine: Max assist   General bed mobility comments: assist to advance BLE. Max A to scoot up in bed.  Transfers Overall transfer level: Needs assistance Equipment used: Rolling walker (2 wheeled) Transfers: Stand Pivot Transfers;Sit to/from Stand Sit to Stand: Max assist Stand pivot transfers: Max assist (ideally +2 safety)       General transfer comment: recliner to 3n1. Pt then completed full turn to EOB with max A for balance and RLE management,    Balance Overall balance assessment: Needs assistance;History of Falls Sitting-balance support:  Single extremity supported;Feet supported Sitting balance-Leahy Scale: Fair     Standing balance support: Single extremity supported;During functional activity Standing balance-Leahy Scale: Zero                              ADL Overall ADL's : Needs assistance/impaired Eating/Feeding: Minimal assistance;Sitting   Grooming: Minimal assistance;Sitting   Upper Body Bathing: Moderate assistance;Sitting   Lower Body Bathing: Maximal assistance;Sit to/from stand   Upper Body Dressing : Minimal assistance;Sitting   Lower Body Dressing: Maximal assistance;Sit to/from stand   Toilet Transfer: Maximal assistance;Stand-pivot;BSC   Toileting- Clothing Manipulation and Hygiene: Maximal assistance;Sit to/from stand   Tub/ Shower Transfer: Maximal assistance;Stand-pivot;3 in 1     General ADL Comments: Pt agitated throughout session. Positioning and blocking of  RLE needed during SPT, ideally +2 for safety. Son reports 24 hour assist at baseline and believes she will perform better at home. Discussed SNF vs. home. At this time he is declining SNF.      Vision     Perception     Praxis      Pertinent Vitals/Pain Pain Assessment: Faces Faces Pain Scale: Hurts even more Pain Location: head Pain Descriptors / Indicators: Moaning Pain Intervention(s): Repositioned;Monitored during session     Hand Dominance Left   Extremity/Trunk Assessment Upper Extremity Assessment Upper Extremity Assessment: Generalized weakness;Difficult to assess due to impaired cognition   Lower Extremity Assessment Lower Extremity Assessment: Defer to PT evaluation   Cervical / Trunk Assessment Cervical /  Trunk Assessment: Kyphotic   Communication Communication Communication: Expressive difficulties;Receptive difficulties   Cognition Arousal/Alertness: Awake/alert Behavior During Therapy: Anxious;Restless Overall Cognitive Status: History of cognitive impairments - at baseline                      General Comments       Exercises       Shoulder Instructions      Home Living Family/patient expects to be discharged to:: Private residence Living Arrangements: Children;Other relatives Available Help at Discharge: Family;Available 24 hours/day Type of Home: House Home Access: Level entry     Home Layout: Two level;Bed/bath upstairs Alternate Level Stairs-Number of Steps: flight Alternate Level Stairs-Rails: Right Bathroom Shower/Tub: Chief Strategy Officer: Standard Bathroom Accessibility: Yes How Accessible: Accessible via walker Home Equipment: Bedside commode;Cane - quad;Wheelchair - manual;Shower seat   Additional Comments: Information and history obtained from previous admission history      Prior Functioning/Environment Level of Independence: Needs assistance  Gait / Transfers Assistance Needed: pt reports that she stays in her wheelchair most of the time.  ADL's / Homemaking Assistance Needed: Son reports mod A for bathing/dressing. Min A for SPT to [facility]. Son reports "she does better for [place] at home."        OT Diagnosis: Cognitive deficits;Generalized weakness;Acute pain;Hemiplegia dominant side;Paresis;Altered mental status   OT Problem List: Decreased strength;Decreased range of motion;Decreased activity tolerance;Impaired vision/perception;Impaired balance (sitting and/or standing);Decreased coordination;Decreased cognition;Decreased safety awareness;Decreased knowledge of use of DME or AE;Decreased knowledge of precautions;Impaired sensation;Impaired tone;Impaired UE functional use;Pain   OT Treatment/Interventions: Self-care/ADL training;Therapeutic exercise;Neuromuscular education;Energy conservation;DME and/or AE instruction;Therapeutic activities;Cognitive remediation/compensation;Visual/perceptual remediation/compensation;Patient/family education;Balance training    OT Goals(Current goals can be found in the care plan section)  Acute Rehab OT Goals Patient Stated Goal: none stated OT Goal Formulation: With patient/family Time For Goal Achievement: 01/21/15 Potential to Achieve Goals: Fair ADL Goals Pt Will Perform Upper Body Dressing: with mod assist;sitting Pt Will Perform Lower Body Dressing: with mod assist;sit to/from stand;with adaptive equipment Pt Will Transfer to Toilet: with mod assist;stand pivot transfer;bedside commode Pt Will Perform Toileting - Clothing Manipulation and hygiene: with mod assist;with adaptive equipment;sit to/from stand Pt Will Perform Tub/Shower Transfer: with mod assist;Stand pivot transfer;3 in 1 Additional ADL Goal #1: Pt will perform bed mobility at mod A level.  OT Frequency: Min 3X/week   Barriers to D/C:            Co-evaluation              End of Session Equipment Utilized During Treatment: Gait belt Nurse Communication: Other (comment) (Nurse present during transfer)  Activity Tolerance: Treatment limited secondary to agitation Patient left: in bed;with call bell/phone within reach;with bed alarm set;with family/visitor present   Time: 1410-1445 OT Time Calculation (min): 35 min Charges:  OT General Charges $OT Visit: 1 Procedure OT Evaluation $Initial OT Evaluation Tier I: 1 Procedure OT Treatments $Self Care/Home Management : 8-22 mins G-Codes:    Pilar Grammes 02/06/15, 3:05 PM

## 2015-01-07 NOTE — Progress Notes (Signed)
VASCULAR LAB PRELIMINARY  PRELIMINARY  PRELIMINARY  PRELIMINARY  Bilateral lower extremity venous Dopplers completed.    Preliminary report:  There is no obvious evidence of DVT or SVT noted in the bilateral lower extremities.  Interstitial fluid noted throughout.  Waveforms are pulsatile, suggestive of fluid overload.  Forrester Blando, RVT 01/07/2015, 9:34 AM

## 2015-01-07 NOTE — Progress Notes (Signed)
Talked to patient's son Elizabeth Love primary caregiver about DCP; patient is to return home at discharge; [facility] choices offered, Elizabeth Love chose [facility] and requested a hospital bed; Attending MD at discharge please order HHC/ Disease Management Program for CHF; hospital bed ordered. Elizabeth Love stated that if she is discharged home today the hospital bed does not have to be there; [name] RN,BSN,MHA [phone]

## 2015-01-07 NOTE — Progress Notes (Signed)
PATIENT DETAILS Name: Elizabeth Love Age: 59 y.o. Sex: female Date of Birth: 05/19/1956 Admit Date: 01/05/2015 Admitting Physician Lorretta Harp, MD PCP:No primary care provider on file.  Subjective: Much improved, calm quiet-asked me my name. Expressive aphasia at baseline-says no when asked about pain.  Assessment/Plan: Principal Problem:   Acute encephalopathy: Etiology likely secondary to postictal state. CT head negative for acute abnormalities. Spoke with patient's son on 4/20-has expressive aphasia at baseline, suspect current mental status is at baseline.  Active Problems:   Suspected breakthrough seizures: Secondary to noncompliance. Per patient's son-patient non compliant seizure medications. Change to oral Keppra.    Acute renal failure: Etiology uncertain, secondary to cardiorenal syndrome . Continue LasixStop Aldactone and lisinopril for now, diuretics per cardiology. Check Renal ultrasound, UA neg.    Acute systolic heart failure:2-D echo on 03/21/14 showed EF of 15%, significant weight gain on admission compared to her usual baseline. Continue IV Lasix-weight down 9 lb compared to yesterday. Cardiology following    Paroxysmal Atrial fibrillation: Continue amiodarone, continue Xarelto.CHA2DS2-VASc Score is 6. Cards interrogated ICD-per note-brief runs of Atrial Tachy/Afib    History of DVT: Continue anticoagulation with a Xarelto-question compliance. Per chart review, has a history of protein C deficiency. Has IVC filter in place.Lower ext Doppler negative    Left upper thigh pain: X-ray negative, CT of the abdomen without acute abnormalities. Doppler neg for DVT. Seems to be more comfortable today.     Elevated troponin levels: Suspect secondary to worsening renal function. Doubt further workup needed.    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    History of ventricular tachycardia-status post ICD placement: On amiodarone.  See above regarding ICD interrogation.    Hypothyroidism: Continue with Synthroid    History of chronic pain syndrome/history of right bimalleolar ankle fracture: Continue prn Oxycodone, will start long acting Oxycodone    Noncompliance to medications: Counseled.  Disposition: Remain inpatient-SNF vs Home health services  Antimicrobial agents  See below  Anti-infectives    None      DVT Prophylaxis: Xarelto  Code Status: Full code   Family Communication Son Audry Pili the phone-4/20  Procedures: None  CONSULTS:  cardiology  Time spent 40 minutes-which includes 50% of the time with face-to-face with patient/ family and coordinating care related to the above assessment and plan.  MEDICATIONS: Scheduled Meds: . amiodarone  200 mg Oral Once per day on Mon Tue Wed Thu Fri  . antiseptic oral rinse  7 mL Mouth Rinse q12n4p  . aspirin EC  81 mg Oral Daily  . chlorhexidine  15 mL Mouth Rinse BID  . furosemide  80 mg Intravenous BID  . hydrALAZINE  12.5 mg Oral 3 times per day  . insulin aspart  0-9 Units Subcutaneous TID WC  . isosorbide mononitrate  15 mg Oral Daily  . levETIRAcetam  1,000 mg Oral BID  . levothyroxine  50 mcg Oral QAC breakfast  . pantoprazole  40 mg Oral Daily  . potassium chloride  40 mEq Oral Once  . QUEtiapine Fumarate  150 mg Oral QHS  . Rivaroxaban  15 mg Oral Q supper  . sodium chloride  3 mL Intravenous Q12H   Continuous Infusions:  PRN Meds:.albuterol, ALPRAZolam, hydrALAZINE, morphine injection, ondansetron **OR** ondansetron (ZOFRAN) IV, [DISCONTINUED] oxyCODONE-acetaminophen **AND** oxyCODONE, polyethylene glycol    PHYSICAL EXAM: Vital signs in last 24 hours: Filed Vitals:  01/07/15 0142 01/07/15 0500 01/07/15 0516 01/07/15 1008  BP: 143/86  119/90 132/73  Pulse: 99  96 94  Temp: 98.2 F (36.8 C)  97.7 F (36.5 C) 98.1 F (36.7 C)  TempSrc: Axillary  Axillary Oral  Resp: Weight:  61.4 kg (135 lb 5.8 oz)      SpO2: 100%  100% 94%    Weight change: -0.8 kg (-1 lb 12.2 oz) Filed Weights   01/05/15 2330 01/06/15 0500 01/07/15 0500  Weight: 62.2 kg (137 lb 2 oz) 66.4 kg (146 lb 6.2 oz) 61.4 kg (135 lb 5.8 oz)   Body mass index is 26.58 kg/(m^2).   Gen Exam: Awake/alert, not in any distress. Expressive aphasia Neck: Supple, No JVD.   Chest: B/L Clear.  Few bibasilar rales CVS: S1 S2 Regular Abdomen: soft, BS +, non tender, non distended.  Extremities: 1+ edema, lower extremities warm to touch. Neurologic: left sided hemiplegia Skin: No Rash.   Wounds: [name].   Intake/Output from previous day:  Intake/Output Summary (Last 24 hours) at 01/07/15 1230 Last data filed at 01/06/15 1231  Gross per 24 hour  Intake      0 ml  Output      0 ml  Net      0 ml     LAB RESULTS: CBC  Recent Labs Lab 01/05/15 1606 01/05/15 1634 01/06/15 0530 01/07/15 1000  WBC 6.1  --  6.4 7.3  HGB 8.1* 11.9* 7.7* 8.4*  HCT 28.1* 35.0* 27.2* 29.4*  PLT 221  --  211 223  MCV 86.5  --  88.0 87.5  MCH 24.9*  --  24.9* 25.0*  MCHC 28.8*  --  28.3* 28.6*  RDW 22.6*  --  22.5* 22.2*  LYMPHSABS 1.0  --   --   --   MONOABS 0.7  --   --   --   EOSABS 0.1  --   --   --   BASOSABS 0.0  --   --   --     Chemistries   Recent Labs Lab 01/05/15 1606 01/05/15 1634 01/06/15 0530 01/07/15 1000  NA 137 140 138 139  K 3.3* 3.4* 3.6 4.6  CL 99 98 101 100  CO2 28  --  22 19  GLUCOSE 155* 158* 84 87  BUN 24* 28* 24* 33*  CREATININE 2.33* 2.20* 2.10* 2.46*  CALCIUM 9.1  --  9.0 9.3    CBG:  Recent Labs Lab 01/06/15 1120 01/06/15 1633 01/06/15 2153 01/07/15 0644 01/07/15 1125  GLUCAP 85 78 134* 107* 98    GFR Estimated Creatinine Clearance: 20.8 mL/min (by C-G formula based on Cr of 2.46).  Coagulation profile  Recent Labs Lab 01/05/15 1606  INR 3.86*    Cardiac Enzymes  Recent Labs Lab 01/06/15 0013 01/06/15 0530 01/06/15 1140  TROPONINI 0.33* 0.32* 0.33*    Invalid input(s):  POCBNP No results for input(s): DDIMER in the last 72 hours. No results for input(s): HGBA1C in the last 72 hours. No results for input(s): CHOL, HDL, LDLCALC, TRIG, CHOLHDL, LDLDIRECT in the last 72 hours.  Recent Labs  01/06/15 0530  TSH 6.406*   No results for input(s): VITAMINB12, FOLATE, FERRITIN, TIBC, IRON, RETICCTPCT in the last 72 hours.  Recent Labs  01/06/15 0013  LIPASE 15    Urine Studies No results for input(s): UHGB, CRYS in the last 72 hours.  Invalid input(s): UACOL, UAPR, USPG, UPH, UTP, UGL, UKET, UBIL, UNIT, UROB,  ULEU, UEPI, UWBC, URBC, UBAC, CAST, UCOM, BILUA  MICROBIOLOGY: No results found for this or any previous visit (from the past 240 hour(s)).  RADIOLOGY STUDIES/RESULTS: Ct Abdomen Pelvis Wo Contrast  01/06/2015   CLINICAL DATA:  Right-sided chest and abdomen pain. Acute kidney injury.  EXAM: CT ABDOMEN AND PELVIS WITHOUT CONTRAST  TECHNIQUE: Multidetector CT imaging of the abdomen and pelvis was performed following the standard protocol without IV contrast.  COMPARISON:  CT scan dated 03/21/2014  FINDINGS: There is chronic massive cardiomegaly. There is a new small to moderate pericardial effusion. There is a small right pleural effusion with slight atelectasis at the right lung base.  There is increased moderate ascites. Chronic hepatomegaly. Gallbladder is been removed. Spleen, pancreas, adrenal glands, and kidneys appear normal. There is chronic distention of what is probably a lymphatic structure to the right of the aorta at the diaphragmatic crus.  IVC filter in place. Calcification throughout the abdominal aorta and common iliac arteries. Uterus is been removed. Left ovary is normal. Right ovary is not identified.  Bladder is normal. No dilated loops of large small bowel. Normal appendix. No acute osseous abnormality. Focal small disc protrusion at L3-4 to the right.  Diffuse anasarca, increased.  IMPRESSION: 1. New small to moderate pericardial effusion  with a small right pleural effusion. 2. Increased moderate abdominal ascites and increased diffuse anasarca. 3. No other acute intra-abdominal abnormality.   Electronically Signed   By: Francene Boyers M.D.   On: 01/06/2015 07:41   Ct Head Wo Contrast  01/05/2015   CLINICAL DATA:  Found unresponsive sitting in wheelchair. Previous history of seizures and stroke.  EXAM: CT HEAD WITHOUT CONTRAST  TECHNIQUE: Contiguous axial images were obtained from the base of the skull through the vertex without intravenous contrast.  COMPARISON:  10/29/2014  FINDINGS: There is old infarction throughout the left middle cerebral artery territory with atrophy, encephalomalacia and adjacent gliosis. There is no evidence of acute infarction, mass lesion, hemorrhage, hydrocephalus or extra-axial collection. No calvarial abnormality. Sinuses are clear. There is atherosclerotic calcification of the major vessels at the base of the brain.  Tiny air bubbles in the soft tissues of the right face and temporal region could either be due to a laceration, air bubbles an pains related to intravenous wind sub, or conceivably due to infectious inflammation.  IMPRESSION: No acute intracranial finding.  Old left MCA territory infarction.  Small air bubbles in the soft tissues of the right temporal region. Usually, this is insignificant and relates to venous air bubbles from intravenous lines. See above discussion   Electronically Signed   By: Paulina Fusi M.D.   On: 01/05/2015 20:51   US Renal  01/06/2015   CLINICAL DATA:  Acute renal insufficiency, abdominal pain, history of hypertension and diabetes  EXAM: RENAL/URINARY TRACT ULTRASOUND COMPLETE  COMPARISON:  Renal ultrasound dated October 31, 2014  FINDINGS: Right Kidney:  Length: 9.8 cm. The renal cortical echotexture is approximately equal to that of the adjacent liver. No hydronephrosis visualized. There is a lower pole cyst measuring 18 x 14 x 15 mm.  Left Kidney:  Length: 9.5 cm. The  renal cortical echotexture is mildly increased. There is no hydronephrosis.  Bladder:  The bladder is nondistended.  IMPRESSION: Increased renal cortical echotexture consistent with medical renal disease. There is no hydronephrosis.   Electronically Signed   By: David  [place] M.D.   On: 01/06/2015 10:31   Dg Chest Portable 1 View  01/05/2015   CLINICAL DATA:  Chest pain, lower abdominal pain  EXAM: PORTABLE CHEST - 1 VIEW  COMPARISON:  10/30/2014  FINDINGS: Significant cardiomegaly again noted. Dual lead cardiac pacemaker is unchanged in position. Stable small right pleural effusion. Central mild vascular congestion without convincing pulmonary edema. Mild basilar atelectasis. Stable small fluid in right minor fissure. No segmental infiltrate  IMPRESSION: Significant cardiomegaly. Dual lead cardiac pacemaker is unchanged in position. Central mild vascular congestion without convincing pulmonary edema. Small right pleural effusion. Mild basilar atelectasis. No segmental infiltrate.   Electronically Signed   By: Natasha Mead M.D.   On: 01/05/2015 17:26   Dg Femur Min 2 Views Left  01/06/2015   CLINICAL DATA:  Left thigh pain for several days.  No known injury.  EXAM: LEFT FEMUR 2 VIEWS  COMPARISON:  None.  FINDINGS: The left femur appears normal. No evidence of hip or knee effusions. Edema in the subcutaneous soft tissues of the thigh.  IMPRESSION: Soft tissue edema.  Otherwise, normal exam.   Electronically Signed   By: Francene Boyers M.D.   On: 01/06/2015 07:59    Jeoffrey Massed, MD  [facility] Pager:[phone]  If 7PM-7AM, please contact night-coverage [email or web address] Password [email or web address] 01/07/2015, 12:30 PM   LOS: 2 days

## 2015-01-07 NOTE — Progress Notes (Signed)
OT Cancellation Note  Patient Details Name: Elizabeth Love MRN: [id number] DOB: 1956/06/19   Cancelled Treatment:    Reason Eval/Treat Not Completed: Other (comment) (Pt has an active bed rest order.) OT to reattempt when activity order increased as schedule permits.  Pilar Grammes 01/07/2015, 8:53 AM

## 2015-01-08 ENCOUNTER — Encounter: Payer: Self-pay | Admitting: Internal Medicine

## 2015-01-08 DIAGNOSIS — I482 Chronic atrial fibrillation: Secondary | ICD-10-CM

## 2015-01-08 LAB — LEVETIRACETAM LEVEL
Levetiracetam Lvl: NOT DETECTED ug/mL (ref 10.0–40.0)
Levetiracetam Lvl: NOT DETECTED ug/mL (ref 10.0–40.0)

## 2015-01-08 LAB — UREA NITROGEN, URINE: UREA NITROGEN UR: 243 mg/dL

## 2015-01-08 LAB — BASIC METABOLIC PANEL
Anion gap: 17 — ABNORMAL HIGH (ref 5–15)
BUN: 44 mg/dL — AB (ref 6–23)
CO2: 21 mmol/L (ref 19–32)
CREATININE: 2.71 mg/dL — AB (ref 0.50–1.10)
Calcium: 9.4 mg/dL (ref 8.4–10.5)
Chloride: 100 mmol/L (ref 96–112)
GFR calc Af Amer: 21 mL/min — ABNORMAL LOW (ref >90)
GFR, EST NON AFRICAN AMERICAN: 19 mL/min — AB (ref >90)
GLUCOSE: 115 mg/dL — AB (ref 70–99)
Potassium: 4.8 mmol/L (ref 3.5–5.1)
Sodium: 138 mmol/L (ref 135–145)

## 2015-01-08 LAB — GLUCOSE, CAPILLARY
GLUCOSE-CAPILLARY: 111 mg/dL — AB (ref 70–99)
Glucose-Capillary: 116 mg/dL — ABNORMAL HIGH (ref 70–99)
Glucose-Capillary: 101 mg/dL — ABNORMAL HIGH (ref 70–99)

## 2015-01-08 MED ORDER — HYDRALAZINE HCL 25 MG PO TABS: 12.5000 mg | ORAL_TABLET | Freq: Three times a day (TID) | ORAL | Status: AC

## 2015-01-08 MED ORDER — FUROSEMIDE 40 MG PO TABS: 40.0000 mg | ORAL_TABLET | Freq: Every day | ORAL | Status: DC

## 2015-01-08 MED ORDER — AMIODARONE HCL 200 MG PO TABS
200.0000 mg | ORAL_TABLET | Freq: Every day | ORAL | Status: DC
  Administered 2015-01-08: 200 mg via ORAL

## 2015-01-08 MED ORDER — RIVAROXABAN 15 MG PO TABS: 15.0000 mg | ORAL_TABLET | Freq: Every day | ORAL | Status: AC

## 2015-01-08 MED ORDER — FUROSEMIDE 40 MG PO TABS: 40.0000 mg | ORAL_TABLET | Freq: Every day | ORAL | Status: AC

## 2015-01-08 MED ORDER — ISOSORBIDE MONONITRATE ER 30 MG PO TB24: 15.0000 mg | ORAL_TABLET | Freq: Every day | ORAL | Status: AC

## 2015-01-08 NOTE — Clinical Social Work Note (Signed)
CSW met with the pt's son Darnelle Maffucci at the bedside. CSW introduce self and purpose of the visit. CSW discussed PT's clinical recommendations for SNF placement. Darnelle Maffucci decline SNF placement. Darnelle Maffucci reported the family will provided 24 hour care. Darnelle Maffucci reported that the pt receive [facility] services and her nurse is Shelton Silvas [phone]. Darnelle Maffucci reported [facility] provided 6 hours per day care. Darnelle Maffucci requested a list of home health agencies. CSW informed case Freight forwarder.   [place], MSW, [place]

## 2015-01-08 NOTE — Progress Notes (Signed)
Discharge orders received. Pt's son stated that [facility] could transport pt home. Pt dressed in paper gown and belongings packed. IV and tele removed. No family available at bedside to provide education. AVS and prescriptions in envelope for [facility]. [facility] picked up pt for transport.

## 2015-01-08 NOTE — Discharge Summary (Addendum)
PATIENT DETAILS Name: Elizabeth Love Age: 59 y.o. Sex: female Date of Birth: 1955/12/24 MRN: [id number]. Admitting Physician: Lorretta Harp, MD JYN:[id number],ZHYQMVH M, MD  Admit Date: 01/05/2015 Discharge date: 01/08/2015  Recommendations for Outpatient Follow-up:  1. Please check BMET and CBC in 1 week  2. Emphasize importance regarding compliance to medications 3. Consider outpatient referral to Pain management.   PRIMARY DISCHARGE DIAGNOSIS:  Principal Problem:   Acute encephalopathy Active Problems:   Hypothyroidism   Hypokalemia   Protein C deficiency   Noncompliance   DM type 2 (diabetes mellitus, type 2)   Automatic implantable cardioverter-defibrillator in situ   History of DVT (deep vein thrombosis)   History of stroke with residual effects   Hypertension   Chronic systolic CHF (congestive heart failure)   Atrial fibrillation   Seizures   Acute renal failure superimposed on stage 3 chronic kidney disease   Abdominal pain   Left thigh pain   ARF (acute renal failure)   Acute systolic congestive heart failure      PAST MEDICAL HISTORY: Past Medical History  Diagnosis Date  . Seizures   . Hypertension   . Stroke     2007 - R sided weakness and expressive aphasia with h/o behavioral problems related to this  . Asthma   . Diabetes mellitus   . Chronic systolic CHF (congestive heart failure)   . Atrial fibrillation     a. On Xarelto after having DVT (prev felt to be poor anticoag cand 2/2 noncompliance).  . History of DVT (deep vein thrombosis)     a. 06/2012: RLE DVT, + protein C deficiency, placed on Xarelto. s/p IVC filter  . Noncompliance     Due to mental status, family has had to coax her to take medicines  . Hypothyroidism   . VT (ventricular tachycardia)     a. h/o ICD ~2007-2008. b. Adm 08/2012 with UTI, had VT/ICD shock x 5 (hypokalemic);  c. 06/2013 s/p ICD Gen change: SJM DC ICD ser #: [id number]  . Protein C deficiency   . NICM (nonischemic  cardiomyopathy)     a. s/p AICD ~2008 ([employer]) - previous care [facility]. No known hx CAD. b. EF 10% by echo 04/2012;  c. 06/2013 s/p ICD Gen change: SJM DC ICD ser #: [id number]  . 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 Panic attacks   . Anxiety     DISCHARGE MEDICATIONS: Current Discharge Medication List    START taking these medications   Details  hydrALAZINE (APRESOLINE) 25 MG tablet Take 0.5 tablets (12.5 mg total) by mouth every 8 (eight) hours. Qty: 60 tablet, Refills: 0    isosorbide mononitrate (IMDUR) 30 MG 24 hr tablet Take 0.5 tablets (15 mg total) by mouth daily. Qty: 30 tablet, Refills: 0      CONTINUE these medications which have CHANGED   Details  furosemide (LASIX) 40 MG tablet Take 1 tablet (40 mg total) by mouth daily. Qty: 30 tablet, Refills: 0    Rivaroxaban (XARELTO) 15 MG TABS tablet Take 1 tablet (15 mg total) by mouth daily with supper. Qty: 30 tablet, Refills: 0      CONTINUE these medications which have NOT CHANGED   Details  albuterol (PROVENTIL) (2.5 MG/3ML) 0.083% nebulizer solution Take 2.5 mg by nebulization every 6 (six) hours as needed. For cough or wheeze    amiodarone (PACERONE) 200 MG tablet Take 1 tablet (200 mg total) by mouth See admin instructions.  Takes  on M,T,W,Th,F only Skips doses on Sat and Sun Qty: 30 tablet, Refills: 0    insulin lispro (HUMALOG) 100 UNIT/ML injection Inject into the skin 3 (three) times daily before meals. On sliding scale    levETIRAcetam (KEPPRA) 1000 MG tablet Take 2 tablets (2,000 mg total) by mouth 2 (two) times daily. Qty: 120 tablet, Refills: 0    levothyroxine (SYNTHROID, LEVOTHROID) 50 MCG tablet Take 1 tablet (50 mcg total) by mouth daily before breakfast. Qty: 30 tablet, Refills: 0    oxyCODONE-acetaminophen (PERCOCET) 10-325 MG per tablet Take 1 tablet by mouth every 6 (six) hours as needed for pain. Qty: 15 tablet, Refills: 0    pantoprazole (PROTONIX) 40 MG tablet Take 1 tablet  (40 mg total) by mouth daily. Qty: 30 tablet, Refills: 0    polyethylene glycol (MIRALAX / GLYCOLAX) packet Take 17 g by mouth daily as needed (constipation).     QUEtiapine Fumarate (SEROQUEL XR) 150 MG 24 hr tablet Take 1 tablet (150 mg total) by mouth at bedtime. Qty: 30 tablet, Refills: 0    Vitamin D, Ergocalciferol, (DRISDOL) 50000 UNITS CAPS capsule Take 50,000 Units by mouth every 7 (seven) days.    acetaminophen (TYLENOL) 325 MG tablet Take 650 mg by mouth every 6 (six) hours as needed for pain.    ALPRAZolam (XANAX) 0.5 MG tablet Take 0.5 mg by mouth 2 (two) times daily. Refills: 0      STOP taking these medications     lisinopril (PRINIVIL,ZESTRIL) 5 MG tablet      metolazone (ZAROXOLYN) 2.5 MG tablet      spironolactone (ALDACTONE) 25 MG tablet         ALLERGIES:   Allergies  Allergen Reactions  . Crab [Shellfish Allergy] Itching and Swelling    Stuffed crab only, can eat crab legs.  . Penicillins Rash  . Sulfa Antibiotics Rash    BRIEF HPI:  See H&P, Labs, Consult and Test reports for all details in brief, patient  is a 59 y.o. female with past medical history hypertension, diabetes mellitus, GERD, hypothyroidism, panic attack, anxiety, AICD, seizure, stroke, systolic congestive heart failure, atrial fibrillation on Xarelto, protein C deficiency, chronic kidney disease-stage III, asthma, right lower extremity DVT, who presented with altered mental status  CONSULTATIONS:   cardiology  PERTINENT RADIOLOGIC STUDIES: Ct Abdomen Pelvis Wo Contrast  01/06/2015   CLINICAL DATA:  Right-sided chest and abdomen pain. Acute kidney injury.  EXAM: CT ABDOMEN AND PELVIS WITHOUT CONTRAST  TECHNIQUE: Multidetector CT imaging of the abdomen and pelvis was performed following the standard protocol without IV contrast.  COMPARISON:  CT scan dated 03/21/2014  FINDINGS: There is chronic massive cardiomegaly. There is a new small to moderate pericardial effusion. There is a small  right pleural effusion with slight atelectasis at the right lung base.  There is increased moderate ascites. Chronic hepatomegaly. Gallbladder is been removed. Spleen, pancreas, adrenal glands, and kidneys appear normal. There is chronic distention of what is probably a lymphatic structure to the right of the aorta at the diaphragmatic crus.  IVC filter in place. Calcification throughout the abdominal aorta and common iliac arteries. Uterus is been removed. Left ovary is normal. Right ovary is not identified.  Bladder is normal. No dilated loops of large small bowel. Normal appendix. No acute osseous abnormality. Focal small disc protrusion at L3-4 to the right.  Diffuse anasarca, increased.  IMPRESSION: 1. New small to moderate pericardial effusion with a small right pleural effusion. 2. Increased  moderate abdominal ascites and increased diffuse anasarca. 3. No other acute intra-abdominal abnormality.   Electronically Signed   By: Francene Boyers M.D.   On: 01/06/2015 07:41   Ct Head Wo Contrast  01/05/2015   CLINICAL DATA:  Found unresponsive sitting in wheelchair. Previous history of seizures and stroke.  EXAM: CT HEAD WITHOUT CONTRAST  TECHNIQUE: Contiguous axial images were obtained from the base of the skull through the vertex without intravenous contrast.  COMPARISON:  10/29/2014  FINDINGS: There is old infarction throughout the left middle cerebral artery territory with atrophy, encephalomalacia and adjacent gliosis. There is no evidence of acute infarction, mass lesion, hemorrhage, hydrocephalus or extra-axial collection. No calvarial abnormality. Sinuses are clear. There is atherosclerotic calcification of the major vessels at the base of the brain.  Tiny air bubbles in the soft tissues of the right face and temporal region could either be due to a laceration, air bubbles an pains related to intravenous wind sub, or conceivably due to infectious inflammation.  IMPRESSION: No acute intracranial finding.   Old left MCA territory infarction.  Small air bubbles in the soft tissues of the right temporal region. Usually, this is insignificant and relates to venous air bubbles from intravenous lines. See above discussion   Electronically Signed   By: Paulina Fusi M.D.   On: 01/05/2015 20:51   US Renal  01/06/2015   CLINICAL DATA:  Acute renal insufficiency, abdominal pain, history of hypertension and diabetes  EXAM: RENAL/URINARY TRACT ULTRASOUND COMPLETE  COMPARISON:  Renal ultrasound dated October 31, 2014  FINDINGS: Right Kidney:  Length: 9.8 cm. The renal cortical echotexture is approximately equal to that of the adjacent liver. No hydronephrosis visualized. There is a lower pole cyst measuring 18 x 14 x 15 mm.  Left Kidney:  Length: 9.5 cm. The renal cortical echotexture is mildly increased. There is no hydronephrosis.  Bladder:  The bladder is nondistended.  IMPRESSION: Increased renal cortical echotexture consistent with medical renal disease. There is no hydronephrosis.   Electronically Signed   By: David  [place] M.D.   On: 01/06/2015 10:31   Dg Chest Portable 1 View  01/05/2015   CLINICAL DATA:  Chest pain, lower abdominal pain  EXAM: PORTABLE CHEST - 1 VIEW  COMPARISON:  10/30/2014  FINDINGS: Significant cardiomegaly again noted. Dual lead cardiac pacemaker is unchanged in position. Stable small right pleural effusion. Central mild vascular congestion without convincing pulmonary edema. Mild basilar atelectasis. Stable small fluid in right minor fissure. No segmental infiltrate  IMPRESSION: Significant cardiomegaly. Dual lead cardiac pacemaker is unchanged in position. Central mild vascular congestion without convincing pulmonary edema. Small right pleural effusion. Mild basilar atelectasis. No segmental infiltrate.   Electronically Signed   By: Natasha Mead M.D.   On: 01/05/2015 17:26   Dg Femur Min 2 Views Left  01/06/2015   CLINICAL DATA:  Left thigh pain for several days.  No known injury.  EXAM: LEFT  FEMUR 2 VIEWS  COMPARISON:  None.  FINDINGS: The left femur appears normal. No evidence of hip or knee effusions. Edema in the subcutaneous soft tissues of the thigh.  IMPRESSION: Soft tissue edema.  Otherwise, normal exam.   Electronically Signed   By: Francene Boyers M.D.   On: 01/06/2015 07:59     PERTINENT LAB RESULTS: CBC:  Recent Labs  01/06/15 0530 01/07/15 1000  WBC 6.4 7.3  HGB 7.7* 8.4*  HCT 27.2* 29.4*  PLT 211 223   CMET CMP     Component  Value Date/Time   NA 138 01/08/2015 0602   K 4.8 01/08/2015 0602   CL 100 01/08/2015 0602   CO2 21 01/08/2015 0602   GLUCOSE 115* 01/08/2015 0602   BUN 44* 01/08/2015 0602   CREATININE 2.71* 01/08/2015 0602   CALCIUM 9.4 01/08/2015 0602   PROT 7.8 01/06/2015 0530   ALBUMIN 2.8* 01/06/2015 0530   AST 25 01/06/2015 0530   ALT 9 01/06/2015 0530   ALKPHOS 56 01/06/2015 0530   BILITOT 2.3* 01/06/2015 0530   GFRNONAA 19* 01/08/2015 0602   GFRAA 21* 01/08/2015 0602    GFR Estimated Creatinine Clearance: 18.7 mL/min (by C-G formula based on Cr of 2.71).  Recent Labs  01/06/15 0013  LIPASE 15    Recent Labs  01/06/15 0013 01/06/15 0530 01/06/15 1140  CKTOTAL  --  173  --   TROPONINI 0.33* 0.32* 0.33*   Invalid input(s): POCBNP No results for input(s): DDIMER in the last 72 hours. No results for input(s): HGBA1C in the last 72 hours. No results for input(s): CHOL, HDL, LDLCALC, TRIG, CHOLHDL, LDLDIRECT in the last 72 hours.  Recent Labs  01/06/15 0530  TSH 6.406*   No results for input(s): VITAMINB12, FOLATE, FERRITIN, TIBC, IRON, RETICCTPCT in the last 72 hours. Coags:  Recent Labs  01/05/15 1606  INR 3.86*   Microbiology: No results found for this or any previous visit (from the past 240 hour(s)).   BRIEF HOSPITAL COURSE:  Acute encephalopathy: Etiology likely secondary to postictal state. CT head negative for acute abnormalities. Spoke with patient's son on 4/20-has expressive aphasia at baseline,  current mental status is at baseline.Awake,alert, follows commands. Much better than on admission  Active Problems:  Suspected breakthrough seizures: Secondary to noncompliance. Per patient's son-patient non compliant seizure medications. Resumed Keppra.Have emphasized importance of counseling and risk of life threatening seizures.    Acute renal failure: Etiology uncertain, suspicion for cardiorenal syndrome. Seen by cards for diuretic management,started on IV Lasix, recommendations are to resume Lasix 40 mg from 4/23. Have discontinued Aldactone and lisinopril. UA neg for proteinuria and renal USG neg for hydronephroses.    Acute systolic heart failure:2-D echo on 03/21/14 showed EF of 15%, significant weight gain on admission compared to her usual baseline. Started on IV Lasix-weight down 15 lb since admission. Etiology followed patient throughout this hospital stay, see above medication list. Given worsening renal function, ACE inhibitor and Aldactone has been discontinued. Spoke with patient's son, informed him that patient would need follow-up with cardiology-see below for appointment.    Paroxysmal Atrial fibrillation: Continue amiodarone, continue Xarelto.CHA2DS2-VASc Score is 6. Cards interrogated ICD-per note-brief runs of Atrial Tachy/Afib   History of DVT: Continue anticoagulation with a Xarelto-question compliance. Per chart review, has a history of protein C deficiency. Has IVC filter in place.Lower ext Doppler negative   Left upper thigh pain: X-ray negative, CT of the abdomen without acute abnormalities. Doppler neg for DVT. Seems to have resolved.   Elevated troponin levels: Suspect secondary to worsening renal function. Doubt further workup needed.   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   History of ventricular tachycardia-status post ICD placement: On amiodarone.    Hypothyroidism: Continue with Synthroid   History of chronic  pain syndrome/history of right bimalleolar ankle fracture: Continue usual narcotic regimen, follow-up with PCP. Consider outpatient pain management referral.   Noncompliance to medications: Counseled extensively.   TODAY-DAY OF DISCHARGE:  Subjective:   Elizabeth Love today has no headache,no chest abdominal  pain,no new weakness tingling or numbness  Objective:   Blood pressure 100/65, pulse 84, temperature 97.4 F (36.3 C), temperature source Axillary, resp. rate 16, weight 59.784 kg (131 lb 12.8 oz), SpO2 97 %.  Intake/Output Summary (Last 24 hours) at 01/08/15 1245 Last data filed at 01/08/15 0800  Gross per 24 hour  Intake     75 ml  Output     60 ml  Net     15 ml   Filed Weights   01/06/15 0500 01/07/15 0500 01/08/15 0500  Weight: 66.4 kg (146 lb 6.2 oz) 61.4 kg (135 lb 5.8 oz) 59.784 kg (131 lb 12.8 oz)    Exam Awake Alert, Oriented *3, No new F.N deficits, Normal affect [place].AT,PERRAL Supple Neck,No JVD, No cervical lymphadenopathy appriciated.  Symmetrical Chest wall movement, Good air movement bilaterally, CTAB RRR,No Gallops,Rubs or new Murmurs, No Parasternal Heave +ve B.Sounds, Abd Soft, Non tender, No organomegaly appriciated, No rebound -guarding or rigidity. No Cyanosis, Clubbing or edema, No new Rash or bruise  DISCHARGE CONDITION: Stable  DISPOSITION: Home with home health services  DISCHARGE INSTRUCTIONS:    Activity:  As tolerated with Full fall precautions use walker/cane & assistance as needed  Diet recommendation: Diabetic Diet Heart Healthy diet  Discharge Instructions    (HEART FAILURE PATIENTS) Call MD:  Anytime you have any of the following symptoms: 1) 3 pound weight gain in 24 hours or 5 pounds in 1 week 2) shortness of breath, with or without a dry hacking cough 3) swelling in the hands, feet or stomach 4) if you have to sleep on extra pillows at night in order to breathe.    Complete by:  As directed      Contraindication to ARB at  discharge    Complete by:  As directed      Diet - low sodium heart healthy    Complete by:  As directed      Diet Carb Modified    Complete by:  As directed      Heart Failure patients record your daily weight using the same scale at the same time of day    Complete by:  As directed      Increase activity slowly    Complete by:  As directed      STOP any activity that causes chest pain, shortness of breath, dizziness, sweating, or exessive weakness    Complete by:  As directed            Follow-up Information    Follow up with [facility].   Why:  they will provide your home health care at your home   Contact information:   [address] [place] [address] [phone]       Follow up with Arvilla Meres, MD On 01/18/2015.   Specialty:  Cardiology   Why:  1:40 [address] 0080    Contact information:   [address] [place] [address] [phone]       Follow up with Karle Plumber, MD. Schedule an appointment as soon as possible for a visit in 1 week.   Specialty:  Internal Medicine   Contact information:   [phone] [place] [address] [phone]         Total Time spent on discharge equals 45 minutes.  SignedJeoffrey Massed 01/08/2015 12:45 PM

## 2015-01-08 NOTE — Progress Notes (Signed)
Advanced Heart Failure Rounding Note   Subjective:    Yesterday she was diuresed with IV lasix. Weight down another 4 pounds. I/O not accurate.     Denies SOB   Objective:   Weight Range:  Vital Signs:   Temp:  [97.9 F (36.6 C)-98.6 F (37 C)] 97.9 F (36.6 C) (04/22 0515) Pulse Rate:  [87-96] 87 (04/22 0515) Resp:  [18-20] 18 (04/22 0515) BP: (97-132)/(55-85) 102/63 mmHg (04/22 0515) SpO2:  [93 %-100 %] 100 % (04/22 0515) Weight:  [131 lb 12.8 oz (59.784 kg)] 131 lb 12.8 oz (59.784 kg) (04/22 0500) Last BM Date:  (PTA, unknown)  Weight change: Filed Weights   01/06/15 0500 01/07/15 0500 01/08/15 0500  Weight: 146 lb 6.2 oz (66.4 kg) 135 lb 5.8 oz (61.4 kg) 131 lb 12.8 oz (59.784 kg)    Intake/Output:   Intake/Output Summary (Last 24 hours) at 01/08/15 0952 Last data filed at 01/07/15 1430  Gross per 24 hour  Intake     50 ml  Output     60 ml  Net    -10 ml     Physical Exam: General:Sitting on side of bed. NAD. + expressive aphasia. No resp difficulty HEENT: normal Neck: supple. JVP 5-6Carotids 2+ bilaterally; no bruits. No lymphadenopathy or thryomegaly appreciated. Cor: PMI normal. Regular rate & rhythm. No rubs, gallops. Probable s3L upper chest scar ICD Lungs: clear Abdomen: soft, nontender, + mildly distended. No hepatosplenomegaly. No bruits or masses. Good bowel sounds. Extremities: no cyanosis, clubbing, rash,no Edema R sided hemiparesis Neuro: alert interactive. R sided weakness. + expressive aphasia  Labs: Basic Metabolic Panel:  Recent Labs Lab 01/05/15 1606 01/05/15 1634 01/06/15 0530 01/07/15 1000 01/08/15 0602  NA 137 140 138 139 138  K 3.3* 3.4* 3.6 4.6 4.8  CL 99 98 101 100 100  CO2 28  --  22 19 21   GLUCOSE 155* 158* 84 87 115*  BUN 24* 28* 24* 33* 44*  CREATININE 2.33* 2.20* 2.10* 2.46* 2.71*  CALCIUM 9.1  --  9.0 9.3 9.4    Liver Function Tests:  Recent Labs Lab 01/05/15 1606 01/06/15 0530  AST 23 25  ALT 9 9   ALKPHOS 63 56  BILITOT 2.0* 2.3*  PROT 7.8 7.8  ALBUMIN 2.9* 2.8*    Recent Labs Lab 01/06/15 0013  LIPASE 15   No results for input(s): AMMONIA in the last 168 hours.  CBC:  Recent Labs Lab 01/05/15 1606 01/05/15 1634 01/06/15 0530 01/07/15 1000  WBC 6.1  --  6.4 7.3  NEUTROABS 4.3  --   --   --   HGB 8.1* 11.9* 7.7* 8.4*  HCT 28.1* 35.0* 27.2* 29.4*  MCV 86.5  --  88.0 87.5  PLT 221  --  211 223    Cardiac Enzymes:  Recent Labs Lab 01/05/15 1606 01/06/15 0013 01/06/15 0530 01/06/15 1140  CKTOTAL  --   --  173  --   TROPONINI 0.34* 0.33* 0.32* 0.33*    BNP: BNP (last 3 results)  Recent Labs  10/30/14 0102  BNP 2965.9*    ProBNP (last 3 results)  Recent Labs  01/10/14 0912 03/21/14 0633 04/15/14 0135  PROBNP 7500.0* 11633.0* 10371.0*      Other results:  Imaging: US Renal  01/06/2015   CLINICAL DATA:  Acute renal insufficiency, abdominal pain, history of hypertension and diabetes  EXAM: RENAL/URINARY TRACT ULTRASOUND COMPLETE  COMPARISON:  Renal ultrasound dated October 31, 2014  FINDINGS: Right Kidney:  Length:  9.8 cm. The renal cortical echotexture is approximately equal to that of the adjacent liver. No hydronephrosis visualized. There is a lower pole cyst measuring 18 x 14 x 15 mm.  Left Kidney:  Length: 9.5 cm. The renal cortical echotexture is mildly increased. There is no hydronephrosis.  Bladder:  The bladder is nondistended.  IMPRESSION: Increased renal cortical echotexture consistent with medical renal disease. There is no hydronephrosis.   Electronically Signed   By: David  [place] M.D.   On: 01/06/2015 10:31     Medications:     Scheduled Medications: . amiodarone  200 mg Oral Once per day on Mon Tue Wed Thu Fri  . antiseptic oral rinse  7 mL Mouth Rinse q12n4p  . chlorhexidine  15 mL Mouth Rinse BID  . furosemide  80 mg Intravenous BID  . hydrALAZINE  12.5 mg Oral 3 times per day  . insulin aspart  0-9 Units Subcutaneous  TID WC  . isosorbide mononitrate  15 mg Oral Daily  . levETIRAcetam  1,000 mg Oral BID  . levothyroxine  50 mcg Oral QAC breakfast  . OxyCODONE  15 mg Oral Q12H  . pantoprazole  40 mg Oral Daily  . potassium chloride  40 mEq Oral Once  . QUEtiapine Fumarate  150 mg Oral QHS  . Rivaroxaban  15 mg Oral Q supper  . sodium chloride  3 mL Intravenous Q12H    Infusions:    PRN Medications: albuterol, ALPRAZolam, hydrALAZINE, morphine injection, ondansetron **OR** ondansetron (ZOFRAN) IV, [DISCONTINUED] oxyCODONE-acetaminophen **AND** oxyCODONE, polyethylene glycol   Assessment:   1. A/C systolic HF 2. NICM, EF 15% d/t NICM 3. Acute on chronic renal failure 4. Expressive aphasia 5. Noncompliance  Plan/Discussion:    Volume status stable. Creatinine bump noted. Hold lasix today. Restart lasix 40 mg daily tomorrow .  No BB. No Ace with CKD. Add hydralazine 12.5 mg tid and imdur 15 mg daily.   Refusing SNF. Will need HH.   Follow up in HF clinic next week. Needs HF paramedicine.   HF meds for D/C  Lasix 40 mg daily on 01/09/2015  Hydralazine 12.5 mg tid Imdur 15 mg daily Xarelto 15 mg daily  Amio 200 mg daily  Length of Stay: 3  CLEGG,AMY NP-C  01/08/2015, 9:52 AM  Advanced Heart Failure Team Pager [phone] (M-F; 7a - 4p)  Please contact [facility] for night-coverage after hours (4p -7a ) and weekends on [email or web address]  Patient seen and examined with Tonye Becket, NP. We discussed all aspects of the encounter. I agree with the assessment and plan as stated above.   Much improved. Ready for d/c. Will f/u in HF clinic.   Audrena Talaga,MD 10:48 AM

## 2015-01-11 ENCOUNTER — Encounter: Payer: Self-pay | Admitting: Internal Medicine

## 2015-01-17 DEATH — deceased

## 2015-01-18 ENCOUNTER — Inpatient Hospital Stay (HOSPITAL_COMMUNITY): Payer: Self-pay
